# Patient Record
Sex: Female | Born: 1940
Health system: Southern US, Community
[De-identification: ages and names within clinical notes are randomized; demographics above are authoritative.]

## PROBLEM LIST (undated history)

## (undated) DIAGNOSIS — E119 Type 2 diabetes mellitus without complications: Secondary | ICD-10-CM

## (undated) DIAGNOSIS — G709 Myoneural disorder, unspecified: Secondary | ICD-10-CM

## (undated) DIAGNOSIS — F329 Major depressive disorder, single episode, unspecified: Secondary | ICD-10-CM

## (undated) DIAGNOSIS — I2510 Atherosclerotic heart disease of native coronary artery without angina pectoris: Secondary | ICD-10-CM

## (undated) DIAGNOSIS — M797 Fibromyalgia: Secondary | ICD-10-CM

## (undated) DIAGNOSIS — J449 Chronic obstructive pulmonary disease, unspecified: Secondary | ICD-10-CM

## (undated) DIAGNOSIS — I639 Cerebral infarction, unspecified: Secondary | ICD-10-CM

## (undated) DIAGNOSIS — J45909 Unspecified asthma, uncomplicated: Secondary | ICD-10-CM

## (undated) DIAGNOSIS — F32A Depression, unspecified: Secondary | ICD-10-CM

## (undated) DIAGNOSIS — K219 Gastro-esophageal reflux disease without esophagitis: Secondary | ICD-10-CM

## (undated) DIAGNOSIS — G8929 Other chronic pain: Secondary | ICD-10-CM

## (undated) DIAGNOSIS — M1990 Unspecified osteoarthritis, unspecified site: Secondary | ICD-10-CM

## (undated) DIAGNOSIS — I10 Essential (primary) hypertension: Secondary | ICD-10-CM

## (undated) HISTORY — PX: APPENDECTOMY: SHX54

## (undated) HISTORY — PX: ABDOMINAL HYSTERECTOMY: SHX81

## (undated) HISTORY — PX: EYE SURGERY: SHX253

## (undated) HISTORY — PX: SHOULDER ARTHROSCOPY: SHX128

## (undated) HISTORY — DX: Atherosclerotic heart disease of native coronary artery without angina pectoris: I25.10

---

## 2012-01-05 DIAGNOSIS — M1710 Unilateral primary osteoarthritis, unspecified knee: Secondary | ICD-10-CM | POA: Insufficient documentation

## 2012-01-05 DIAGNOSIS — M179 Osteoarthritis of knee, unspecified: Secondary | ICD-10-CM | POA: Insufficient documentation

## 2012-07-07 ENCOUNTER — Ambulatory Visit (HOSPITAL_COMMUNITY)
Admission: RE | Admit: 2012-07-07 | Discharge: 2012-07-07 | Disposition: A | Discharge status: Home / Self Care | Payer: Medicare Other | Source: Ambulatory Visit | Attending: Family Medicine | Admitting: Family Medicine

## 2012-07-07 ENCOUNTER — Other Ambulatory Visit (HOSPITAL_COMMUNITY): Payer: Self-pay | Admitting: Family Medicine

## 2012-07-07 DIAGNOSIS — R112 Nausea with vomiting, unspecified: Secondary | ICD-10-CM | POA: Insufficient documentation

## 2012-07-07 DIAGNOSIS — R109 Unspecified abdominal pain: Secondary | ICD-10-CM | POA: Insufficient documentation

## 2012-07-07 DIAGNOSIS — R932 Abnormal findings on diagnostic imaging of liver and biliary tract: Secondary | ICD-10-CM | POA: Insufficient documentation

## 2012-07-07 DIAGNOSIS — D72829 Elevated white blood cell count, unspecified: Secondary | ICD-10-CM

## 2012-07-07 DIAGNOSIS — R197 Diarrhea, unspecified: Secondary | ICD-10-CM | POA: Insufficient documentation

## 2012-07-07 LAB — POCT I-STAT, CHEM 8
BUN: 19 mg/dL (ref 6–23)
Calcium, Ion: 1.08 mmol/L — ABNORMAL LOW (ref 1.13–1.30)
Chloride: 99 mEq/L (ref 96–112)
Creatinine, Ser: 1 mg/dL (ref 0.50–1.10)
Glucose, Bld: 105 mg/dL — ABNORMAL HIGH (ref 70–99)
HCT: 33 % — ABNORMAL LOW (ref 36.0–46.0)
Hemoglobin: 11.2 g/dL — ABNORMAL LOW (ref 12.0–15.0)
Potassium: 3.9 mEq/L (ref 3.5–5.1)
Sodium: 134 mEq/L — ABNORMAL LOW (ref 135–145)
TCO2: 28 mmol/L (ref 0–100)

## 2012-07-07 MED ORDER — IOHEXOL 300 MG/ML  SOLN
100.0000 mL | Freq: Once | INTRAMUSCULAR | Status: AC | PRN
  Administered 2012-07-07: 100 mL via INTRAVENOUS

## 2012-07-07 NOTE — Progress Notes (Signed)
Blood sample obtained from left arm IV for Creatnine level.  

## 2013-04-30 DIAGNOSIS — M5412 Radiculopathy, cervical region: Secondary | ICD-10-CM | POA: Insufficient documentation

## 2013-06-12 ENCOUNTER — Other Ambulatory Visit: Payer: Self-pay | Admitting: Neurological Surgery

## 2013-07-17 ENCOUNTER — Other Ambulatory Visit (HOSPITAL_COMMUNITY): Payer: Medicare Other

## 2013-07-18 ENCOUNTER — Other Ambulatory Visit: Payer: Self-pay

## 2013-07-24 ENCOUNTER — Other Ambulatory Visit (HOSPITAL_COMMUNITY): Payer: Medicare Other

## 2013-07-25 ENCOUNTER — Encounter (HOSPITAL_COMMUNITY): Admission: RE | Payer: Self-pay | Source: Ambulatory Visit

## 2013-07-25 ENCOUNTER — Inpatient Hospital Stay (HOSPITAL_COMMUNITY): Admission: RE | Admit: 2013-07-25 | Payer: Medicare Other | Source: Ambulatory Visit | Admitting: Neurological Surgery

## 2013-07-25 SURGERY — ANTERIOR CERVICAL DECOMPRESSION/DISCECTOMY FUSION 2 LEVELS
Anesthesia: General

## 2014-02-11 ENCOUNTER — Telehealth: Payer: Self-pay | Admitting: Family Medicine

## 2014-02-11 NOTE — Telephone Encounter (Signed)
Patient aware that we have no available appointments.

## 2014-06-19 DIAGNOSIS — G894 Chronic pain syndrome: Secondary | ICD-10-CM | POA: Diagnosis not present

## 2014-06-19 DIAGNOSIS — E119 Type 2 diabetes mellitus without complications: Secondary | ICD-10-CM | POA: Diagnosis not present

## 2014-06-19 DIAGNOSIS — M159 Polyosteoarthritis, unspecified: Secondary | ICD-10-CM | POA: Diagnosis not present

## 2014-06-19 DIAGNOSIS — J45909 Unspecified asthma, uncomplicated: Secondary | ICD-10-CM | POA: Diagnosis not present

## 2014-09-18 DIAGNOSIS — G609 Hereditary and idiopathic neuropathy, unspecified: Secondary | ICD-10-CM | POA: Diagnosis not present

## 2014-09-18 DIAGNOSIS — M25571 Pain in right ankle and joints of right foot: Secondary | ICD-10-CM | POA: Diagnosis not present

## 2014-09-18 DIAGNOSIS — M79672 Pain in left foot: Secondary | ICD-10-CM | POA: Diagnosis not present

## 2014-09-18 DIAGNOSIS — M25572 Pain in left ankle and joints of left foot: Secondary | ICD-10-CM | POA: Diagnosis not present

## 2014-09-18 DIAGNOSIS — M792 Neuralgia and neuritis, unspecified: Secondary | ICD-10-CM | POA: Diagnosis not present

## 2014-09-18 DIAGNOSIS — M79671 Pain in right foot: Secondary | ICD-10-CM | POA: Diagnosis not present

## 2014-09-19 DIAGNOSIS — J309 Allergic rhinitis, unspecified: Secondary | ICD-10-CM | POA: Diagnosis not present

## 2014-09-19 DIAGNOSIS — B379 Candidiasis, unspecified: Secondary | ICD-10-CM | POA: Diagnosis not present

## 2014-09-19 DIAGNOSIS — M609 Myositis, unspecified: Secondary | ICD-10-CM | POA: Diagnosis not present

## 2014-09-19 DIAGNOSIS — M159 Polyosteoarthritis, unspecified: Secondary | ICD-10-CM | POA: Diagnosis not present

## 2014-09-19 DIAGNOSIS — I10 Essential (primary) hypertension: Secondary | ICD-10-CM | POA: Diagnosis not present

## 2014-09-19 DIAGNOSIS — E119 Type 2 diabetes mellitus without complications: Secondary | ICD-10-CM | POA: Diagnosis not present

## 2014-09-19 DIAGNOSIS — M791 Myalgia: Secondary | ICD-10-CM | POA: Diagnosis not present

## 2014-09-19 DIAGNOSIS — E669 Obesity, unspecified: Secondary | ICD-10-CM | POA: Diagnosis not present

## 2014-09-19 DIAGNOSIS — G4700 Insomnia, unspecified: Secondary | ICD-10-CM | POA: Diagnosis not present

## 2014-09-19 DIAGNOSIS — J45909 Unspecified asthma, uncomplicated: Secondary | ICD-10-CM | POA: Diagnosis not present

## 2014-10-02 DIAGNOSIS — G5752 Tarsal tunnel syndrome, left lower limb: Secondary | ICD-10-CM | POA: Diagnosis not present

## 2014-10-02 DIAGNOSIS — M65872 Other synovitis and tenosynovitis, left ankle and foot: Secondary | ICD-10-CM | POA: Diagnosis not present

## 2014-10-02 DIAGNOSIS — M792 Neuralgia and neuritis, unspecified: Secondary | ICD-10-CM | POA: Diagnosis not present

## 2014-10-21 DIAGNOSIS — M792 Neuralgia and neuritis, unspecified: Secondary | ICD-10-CM | POA: Diagnosis not present

## 2014-11-27 DIAGNOSIS — M792 Neuralgia and neuritis, unspecified: Secondary | ICD-10-CM | POA: Diagnosis not present

## 2014-12-11 DIAGNOSIS — M797 Fibromyalgia: Secondary | ICD-10-CM | POA: Diagnosis not present

## 2014-12-11 DIAGNOSIS — R202 Paresthesia of skin: Secondary | ICD-10-CM | POA: Diagnosis not present

## 2014-12-11 DIAGNOSIS — E531 Pyridoxine deficiency: Secondary | ICD-10-CM | POA: Diagnosis not present

## 2014-12-11 DIAGNOSIS — M5442 Lumbago with sciatica, left side: Secondary | ICD-10-CM | POA: Diagnosis not present

## 2014-12-11 DIAGNOSIS — G5602 Carpal tunnel syndrome, left upper limb: Secondary | ICD-10-CM | POA: Diagnosis not present

## 2014-12-11 DIAGNOSIS — R634 Abnormal weight loss: Secondary | ICD-10-CM | POA: Diagnosis not present

## 2014-12-11 DIAGNOSIS — G603 Idiopathic progressive neuropathy: Secondary | ICD-10-CM | POA: Diagnosis not present

## 2014-12-11 DIAGNOSIS — M5412 Radiculopathy, cervical region: Secondary | ICD-10-CM | POA: Diagnosis not present

## 2014-12-11 DIAGNOSIS — G609 Hereditary and idiopathic neuropathy, unspecified: Secondary | ICD-10-CM | POA: Diagnosis not present

## 2014-12-11 DIAGNOSIS — E538 Deficiency of other specified B group vitamins: Secondary | ICD-10-CM | POA: Diagnosis not present

## 2014-12-11 DIAGNOSIS — M5441 Lumbago with sciatica, right side: Secondary | ICD-10-CM | POA: Diagnosis not present

## 2014-12-11 DIAGNOSIS — M5417 Radiculopathy, lumbosacral region: Secondary | ICD-10-CM | POA: Diagnosis not present

## 2014-12-30 DIAGNOSIS — M797 Fibromyalgia: Secondary | ICD-10-CM | POA: Diagnosis not present

## 2014-12-30 DIAGNOSIS — R300 Dysuria: Secondary | ICD-10-CM | POA: Diagnosis not present

## 2014-12-30 DIAGNOSIS — I10 Essential (primary) hypertension: Secondary | ICD-10-CM | POA: Diagnosis not present

## 2014-12-30 DIAGNOSIS — E119 Type 2 diabetes mellitus without complications: Secondary | ICD-10-CM | POA: Diagnosis not present

## 2014-12-30 DIAGNOSIS — M4800 Spinal stenosis, site unspecified: Secondary | ICD-10-CM | POA: Diagnosis not present

## 2014-12-30 DIAGNOSIS — B379 Candidiasis, unspecified: Secondary | ICD-10-CM | POA: Diagnosis not present

## 2014-12-30 DIAGNOSIS — M159 Polyosteoarthritis, unspecified: Secondary | ICD-10-CM | POA: Diagnosis not present

## 2014-12-30 DIAGNOSIS — Z79899 Other long term (current) drug therapy: Secondary | ICD-10-CM | POA: Diagnosis not present

## 2015-01-14 DIAGNOSIS — M5442 Lumbago with sciatica, left side: Secondary | ICD-10-CM | POA: Diagnosis not present

## 2015-01-14 DIAGNOSIS — M797 Fibromyalgia: Secondary | ICD-10-CM | POA: Diagnosis not present

## 2015-01-14 DIAGNOSIS — R202 Paresthesia of skin: Secondary | ICD-10-CM | POA: Diagnosis not present

## 2015-01-14 DIAGNOSIS — M542 Cervicalgia: Secondary | ICD-10-CM | POA: Diagnosis not present

## 2015-01-14 DIAGNOSIS — G5601 Carpal tunnel syndrome, right upper limb: Secondary | ICD-10-CM | POA: Diagnosis not present

## 2015-01-14 DIAGNOSIS — G5602 Carpal tunnel syndrome, left upper limb: Secondary | ICD-10-CM | POA: Diagnosis not present

## 2015-01-14 DIAGNOSIS — M5441 Lumbago with sciatica, right side: Secondary | ICD-10-CM | POA: Diagnosis not present

## 2015-01-20 DIAGNOSIS — M7989 Other specified soft tissue disorders: Secondary | ICD-10-CM | POA: Diagnosis not present

## 2015-01-20 DIAGNOSIS — M25571 Pain in right ankle and joints of right foot: Secondary | ICD-10-CM | POA: Diagnosis not present

## 2015-01-20 DIAGNOSIS — M7731 Calcaneal spur, right foot: Secondary | ICD-10-CM | POA: Diagnosis not present

## 2015-01-20 DIAGNOSIS — M19071 Primary osteoarthritis, right ankle and foot: Secondary | ICD-10-CM | POA: Diagnosis not present

## 2015-02-14 DIAGNOSIS — J4541 Moderate persistent asthma with (acute) exacerbation: Secondary | ICD-10-CM | POA: Diagnosis not present

## 2015-02-14 DIAGNOSIS — Z6838 Body mass index (BMI) 38.0-38.9, adult: Secondary | ICD-10-CM | POA: Diagnosis not present

## 2015-02-14 DIAGNOSIS — J309 Allergic rhinitis, unspecified: Secondary | ICD-10-CM | POA: Diagnosis not present

## 2015-02-14 DIAGNOSIS — E669 Obesity, unspecified: Secondary | ICD-10-CM | POA: Diagnosis not present

## 2015-02-18 DIAGNOSIS — M5442 Lumbago with sciatica, left side: Secondary | ICD-10-CM | POA: Diagnosis not present

## 2015-02-18 DIAGNOSIS — G5602 Carpal tunnel syndrome, left upper limb: Secondary | ICD-10-CM | POA: Diagnosis not present

## 2015-02-18 DIAGNOSIS — M5441 Lumbago with sciatica, right side: Secondary | ICD-10-CM | POA: Diagnosis not present

## 2015-02-18 DIAGNOSIS — M542 Cervicalgia: Secondary | ICD-10-CM | POA: Diagnosis not present

## 2015-02-18 DIAGNOSIS — G5601 Carpal tunnel syndrome, right upper limb: Secondary | ICD-10-CM | POA: Diagnosis not present

## 2015-04-09 DIAGNOSIS — M170 Bilateral primary osteoarthritis of knee: Secondary | ICD-10-CM | POA: Diagnosis not present

## 2015-04-14 DIAGNOSIS — E119 Type 2 diabetes mellitus without complications: Secondary | ICD-10-CM | POA: Diagnosis not present

## 2015-04-14 DIAGNOSIS — Z6839 Body mass index (BMI) 39.0-39.9, adult: Secondary | ICD-10-CM | POA: Diagnosis not present

## 2015-04-14 DIAGNOSIS — M797 Fibromyalgia: Secondary | ICD-10-CM | POA: Diagnosis not present

## 2015-04-14 DIAGNOSIS — R110 Nausea: Secondary | ICD-10-CM | POA: Diagnosis not present

## 2015-04-14 DIAGNOSIS — M4800 Spinal stenosis, site unspecified: Secondary | ICD-10-CM | POA: Diagnosis not present

## 2015-04-14 DIAGNOSIS — G894 Chronic pain syndrome: Secondary | ICD-10-CM | POA: Diagnosis not present

## 2015-04-14 DIAGNOSIS — E669 Obesity, unspecified: Secondary | ICD-10-CM | POA: Diagnosis not present

## 2015-04-14 DIAGNOSIS — G4700 Insomnia, unspecified: Secondary | ICD-10-CM | POA: Diagnosis not present

## 2015-04-17 DIAGNOSIS — M542 Cervicalgia: Secondary | ICD-10-CM | POA: Diagnosis not present

## 2015-04-17 DIAGNOSIS — G603 Idiopathic progressive neuropathy: Secondary | ICD-10-CM | POA: Diagnosis not present

## 2015-04-17 DIAGNOSIS — G5603 Carpal tunnel syndrome, bilateral upper limbs: Secondary | ICD-10-CM | POA: Diagnosis not present

## 2015-04-17 DIAGNOSIS — M5442 Lumbago with sciatica, left side: Secondary | ICD-10-CM | POA: Diagnosis not present

## 2015-04-17 DIAGNOSIS — M5441 Lumbago with sciatica, right side: Secondary | ICD-10-CM | POA: Diagnosis not present

## 2015-04-17 DIAGNOSIS — M797 Fibromyalgia: Secondary | ICD-10-CM | POA: Diagnosis not present

## 2015-04-17 DIAGNOSIS — G2581 Restless legs syndrome: Secondary | ICD-10-CM | POA: Diagnosis not present

## 2015-05-05 DIAGNOSIS — Z961 Presence of intraocular lens: Secondary | ICD-10-CM | POA: Diagnosis not present

## 2015-05-05 DIAGNOSIS — H04123 Dry eye syndrome of bilateral lacrimal glands: Secondary | ICD-10-CM | POA: Diagnosis not present

## 2015-05-05 LAB — HM DIABETES EYE EXAM

## 2015-05-20 DIAGNOSIS — R3915 Urgency of urination: Secondary | ICD-10-CM | POA: Diagnosis not present

## 2015-05-20 DIAGNOSIS — N3281 Overactive bladder: Secondary | ICD-10-CM | POA: Diagnosis not present

## 2015-05-20 DIAGNOSIS — R351 Nocturia: Secondary | ICD-10-CM | POA: Diagnosis not present

## 2015-05-20 DIAGNOSIS — K5909 Other constipation: Secondary | ICD-10-CM | POA: Diagnosis not present

## 2015-06-02 DIAGNOSIS — G2581 Restless legs syndrome: Secondary | ICD-10-CM | POA: Diagnosis not present

## 2015-06-02 DIAGNOSIS — M797 Fibromyalgia: Secondary | ICD-10-CM | POA: Diagnosis not present

## 2015-06-02 DIAGNOSIS — G603 Idiopathic progressive neuropathy: Secondary | ICD-10-CM | POA: Diagnosis not present

## 2015-06-02 DIAGNOSIS — G5603 Carpal tunnel syndrome, bilateral upper limbs: Secondary | ICD-10-CM | POA: Diagnosis not present

## 2015-06-02 DIAGNOSIS — M5442 Lumbago with sciatica, left side: Secondary | ICD-10-CM | POA: Diagnosis not present

## 2015-06-02 DIAGNOSIS — M542 Cervicalgia: Secondary | ICD-10-CM | POA: Diagnosis not present

## 2015-06-02 DIAGNOSIS — M5441 Lumbago with sciatica, right side: Secondary | ICD-10-CM | POA: Diagnosis not present

## 2015-06-06 DIAGNOSIS — M542 Cervicalgia: Secondary | ICD-10-CM | POA: Diagnosis not present

## 2015-06-06 DIAGNOSIS — M5030 Other cervical disc degeneration, unspecified cervical region: Secondary | ICD-10-CM | POA: Diagnosis not present

## 2015-06-06 DIAGNOSIS — M5412 Radiculopathy, cervical region: Secondary | ICD-10-CM | POA: Diagnosis not present

## 2015-06-06 DIAGNOSIS — M4722 Other spondylosis with radiculopathy, cervical region: Secondary | ICD-10-CM | POA: Diagnosis not present

## 2015-06-06 DIAGNOSIS — Z6838 Body mass index (BMI) 38.0-38.9, adult: Secondary | ICD-10-CM | POA: Diagnosis not present

## 2015-06-06 DIAGNOSIS — I10 Essential (primary) hypertension: Secondary | ICD-10-CM | POA: Diagnosis not present

## 2015-06-11 DIAGNOSIS — M4722 Other spondylosis with radiculopathy, cervical region: Secondary | ICD-10-CM | POA: Diagnosis not present

## 2015-06-11 DIAGNOSIS — M4802 Spinal stenosis, cervical region: Secondary | ICD-10-CM | POA: Diagnosis not present

## 2015-06-18 ENCOUNTER — Other Ambulatory Visit: Payer: Self-pay | Admitting: Neurosurgery

## 2015-06-18 DIAGNOSIS — M5412 Radiculopathy, cervical region: Secondary | ICD-10-CM | POA: Diagnosis not present

## 2015-06-18 DIAGNOSIS — M5030 Other cervical disc degeneration, unspecified cervical region: Secondary | ICD-10-CM | POA: Diagnosis not present

## 2015-06-18 DIAGNOSIS — Z6838 Body mass index (BMI) 38.0-38.9, adult: Secondary | ICD-10-CM | POA: Diagnosis not present

## 2015-06-18 DIAGNOSIS — M4722 Other spondylosis with radiculopathy, cervical region: Secondary | ICD-10-CM | POA: Diagnosis not present

## 2015-06-18 DIAGNOSIS — M5020 Other cervical disc displacement, unspecified cervical region: Secondary | ICD-10-CM | POA: Diagnosis not present

## 2015-06-18 DIAGNOSIS — I10 Essential (primary) hypertension: Secondary | ICD-10-CM | POA: Diagnosis not present

## 2015-06-18 DIAGNOSIS — M542 Cervicalgia: Secondary | ICD-10-CM | POA: Diagnosis not present

## 2015-06-27 ENCOUNTER — Encounter (HOSPITAL_COMMUNITY)
Admission: RE | Admit: 2015-06-27 | Discharge: 2015-06-27 | Disposition: A | Discharge status: Home / Self Care | Payer: Medicare Other | Source: Ambulatory Visit | Attending: Neurosurgery | Admitting: Neurosurgery

## 2015-06-27 ENCOUNTER — Encounter (HOSPITAL_COMMUNITY): Payer: Self-pay

## 2015-06-27 DIAGNOSIS — Z01812 Encounter for preprocedural laboratory examination: Secondary | ICD-10-CM | POA: Diagnosis not present

## 2015-06-27 DIAGNOSIS — M797 Fibromyalgia: Secondary | ICD-10-CM | POA: Diagnosis not present

## 2015-06-27 DIAGNOSIS — I10 Essential (primary) hypertension: Secondary | ICD-10-CM | POA: Diagnosis not present

## 2015-06-27 DIAGNOSIS — J45909 Unspecified asthma, uncomplicated: Secondary | ICD-10-CM | POA: Insufficient documentation

## 2015-06-27 DIAGNOSIS — J449 Chronic obstructive pulmonary disease, unspecified: Secondary | ICD-10-CM | POA: Insufficient documentation

## 2015-06-27 DIAGNOSIS — G709 Myoneural disorder, unspecified: Secondary | ICD-10-CM | POA: Insufficient documentation

## 2015-06-27 DIAGNOSIS — M5020 Other cervical disc displacement, unspecified cervical region: Secondary | ICD-10-CM | POA: Insufficient documentation

## 2015-06-27 DIAGNOSIS — Z79899 Other long term (current) drug therapy: Secondary | ICD-10-CM | POA: Diagnosis not present

## 2015-06-27 DIAGNOSIS — K219 Gastro-esophageal reflux disease without esophagitis: Secondary | ICD-10-CM | POA: Diagnosis not present

## 2015-06-27 DIAGNOSIS — Z01818 Encounter for other preprocedural examination: Secondary | ICD-10-CM | POA: Insufficient documentation

## 2015-06-27 DIAGNOSIS — F329 Major depressive disorder, single episode, unspecified: Secondary | ICD-10-CM | POA: Diagnosis not present

## 2015-06-27 HISTORY — DX: Chronic obstructive pulmonary disease, unspecified: J44.9

## 2015-06-27 HISTORY — DX: Unspecified osteoarthritis, unspecified site: M19.90

## 2015-06-27 HISTORY — DX: Gastro-esophageal reflux disease without esophagitis: K21.9

## 2015-06-27 HISTORY — DX: Unspecified asthma, uncomplicated: J45.909

## 2015-06-27 HISTORY — DX: Essential (primary) hypertension: I10

## 2015-06-27 HISTORY — DX: Fibromyalgia: M79.7

## 2015-06-27 HISTORY — DX: Depression, unspecified: F32.A

## 2015-06-27 HISTORY — DX: Myoneural disorder, unspecified: G70.9

## 2015-06-27 HISTORY — DX: Major depressive disorder, single episode, unspecified: F32.9

## 2015-06-27 HISTORY — DX: Type 2 diabetes mellitus without complications: E11.9

## 2015-06-27 LAB — BASIC METABOLIC PANEL
Anion gap: 12 (ref 5–15)
BUN: 18 mg/dL (ref 6–20)
CO2: 27 mmol/L (ref 22–32)
Calcium: 9 mg/dL (ref 8.9–10.3)
Chloride: 101 mmol/L (ref 101–111)
Creatinine, Ser: 0.79 mg/dL (ref 0.44–1.00)
GFR calc Af Amer: >60 mL/min (ref >60)
GFR calc non Af Amer: >60 mL/min (ref >60)
Glucose, Bld: 180 mg/dL — ABNORMAL HIGH (ref 65–99)
Potassium: 3.9 mmol/L (ref 3.5–5.1)
Sodium: 140 mmol/L (ref 135–145)

## 2015-06-27 LAB — SURGICAL PCR SCREEN
MRSA, PCR: NEGATIVE
Staphylococcus aureus: NEGATIVE

## 2015-06-27 LAB — CBC
HCT: 38.9 % (ref 36.0–46.0)
Hemoglobin: 12.6 g/dL (ref 12.0–15.0)
MCH: 28.8 pg (ref 26.0–34.0)
MCHC: 32.4 g/dL (ref 30.0–36.0)
MCV: 88.8 fL (ref 78.0–100.0)
Platelets: 413 10*3/uL — ABNORMAL HIGH (ref 150–400)
RBC: 4.38 MIL/uL (ref 3.87–5.11)
RDW: 13.8 % (ref 11.5–15.5)
WBC: 10.4 10*3/uL (ref 4.0–10.5)

## 2015-06-27 NOTE — Pre-Procedure Instructions (Signed)
Susan Patel  06/27/2015      [id number] [place] [place] [address] Phone: [phone] Fax: [phone]    Your procedure is scheduled on 07/03/2015  Report to [facility] Admitting at 10:15 A.M.  Call this number if you have problems the morning of surgery:  [phone]   Remember:  Do not eat food or drink liquids after midnight.  On Wednesday   Take these medicines the morning of surgery with A SIP OF WATER :  BREO inhaler, Zyrtec, Nexium, (Pain med. - if needed), Premarin, Duloxetine   Do not wear jewelry, make-up or nail polish.   Do not wear lotions, powders, or perfumes.  You may wear deodorant.   Do not shave 48 hours prior to surgery.     Do not bring valuables to the hospital.   [facility] is not responsible for any belongings or valuables.  Contacts, dentures or bridgework may not be worn into surgery.  Leave your suitcase in the car.  After surgery it may be brought to your room.  For patients admitted to the hospital, discharge time will be determined by your treatment team.  Patients discharged the day of surgery will not be allowed to drive home.   Name and phone number of your driver:   /w spouse  Special instructions:  Special Instructions: [place] - Preparing for Surgery  Before surgery, you can play an important role.  Because skin is not sterile, your skin needs to be as free of germs as possible.  You can reduce the number of germs on you skin by washing with CHG (chlorahexidine gluconate) soap before surgery.  CHG is an antiseptic cleaner which kills germs and bonds with the skin to continue killing germs even after washing.  Please DO NOT use if you have an allergy to CHG or antibacterial soaps.  If your skin becomes reddened/irritated stop using the CHG and inform your nurse when you arrive at Short Stay.  Do not shave (including legs and underarms) for at least  48 hours prior to the first CHG shower.  You may shave your face.  Please follow these instructions carefully:   1.  Shower with CHG Soap the night before surgery and the  morning of Surgery.  2.  If you choose to wash your hair, wash your hair first as usual with your  normal shampoo.  3.  After you shampoo, rinse your hair and body thoroughly to remove the  Shampoo.  4.  Use CHG as you would any other liquid soap.  You can apply chg directly to the skin and wash gently with scrungie or a clean washcloth.  5.  Apply the CHG Soap to your body ONLY FROM THE NECK DOWN.    Do not use on open wounds or open sores.  Avoid contact with your eyes, ears, mouth and genitals (private parts).  Wash genitals (private parts)   with your normal soap.  6.  Wash thoroughly, paying special attention to the area where your surgery will be performed.  7.  Thoroughly rinse your body with warm water from the neck down.  8.  DO NOT shower/wash with your normal soap after using and rinsing off   the CHG Soap.  9.  Pat yourself dry with a clean towel.            10.  Wear clean pajamas.  11.  Place clean sheets on your bed the night of your first shower and do not sleep with pets.  Day of Surgery  Do not apply any lotions/deodorants the morning of surgery.  Please wear clean clothes to the hospital/surgery center.  Please read over the following fact sheets that you were given. Pain Booklet, Coughing and Deep Breathing, MRSA Information and Surgical Site Infection Prevention

## 2015-06-27 NOTE — Progress Notes (Signed)
Pt. Followed by Dr. Avel Peace in [place], requesting records by fax, pt. Also remarks that she sees Dr. Trula Ore (neurology) in [facility].  Pt. Reports pain all over her body, states she "was just given Cymbalta" yet  the bottle, has a date of 11/2014. Pt. States she doesn't take it all the time because it isn't helping. Pt. counselled to take meds. As prescribed by doctor or to call the doctor back to tell them its not helping. Pt. Reports that she had a stress test >5 yrs. Ago & told that it was wnl. Pt. Reports that she uses O2 at night , /w a history of asthma.

## 2015-06-30 NOTE — Progress Notes (Addendum)
Anesthesia Chart Review: Patient is a 75 year old female scheduled for C5-6, C6-7 ACDF on 07/03/2015 by Dr. Sherwood Gambler.  History includes nonsmoker, COPD with nocturnal oxygen (3.5-4L/[place]), hypertension, "borderline" diabetes mellitus type II, asthma, depression, fibromyalgia, GERD, neuromuscular disorder ("parkinson, neuropathy-both feet & hands"), hysterectomy, appendectomy. BMI is consistent with obesity.   PCP is Dr. Silvano Rusk with [facility] in [place], last visit 04/23/15. Records do not indicate a history of Parkinson's. She was considering neck surgery at that time.  Neurologist is Dr. Trula Ore with [facility] in [place]. Pulmonologist is Dr. Loletha Carrow (or sees Elijio Miles, PA-C) with [facility] in [place].   I called patient to clarify history. She reports that she has a tremor primarily in her BUE, and was told that she had the beginning stages of Parkinson's disease. She tried one medication (can't remember the name), but said it made her tremor worse so it was stopped. In regards to her COPD, she reports being on home O2 for several years now (initially 2-2.5L but she increased to 3.5-4L about a year ago because she felt she couldn't breathe as well on her previous regimen). She thinks last spirometry was > 1 year ago. She says she tested negative for OSA, but was told she had RLS. She lives at home with her husband of 50+ years. Prior to one month ago (before her gait instability, possible related to her cervical stenosis), she was able to do light house work (folding clothes, dusting) for up to 2 hours without stopping and did not need supplemental oxygen. She does have days that she wears her O2, but not recently. Denied any recent hospitalization for COPD exacerbation. Has a intermittent cough felt more related to her asthma. No fever. She can sleep on her back with only one pillow.    Current med list includes albuterol, Breo Ellipta, Zyrtec, Cymbalta, Nexium,  HCTZ, Norco, Xalantan ophthalmic, losartan, Singulair, Premarin, Ambien. (Patient reported to PAT RN that she will bring a written list on the day of surgery to verify we have the most updated list.)  06/27/15 EKG: NSR. She reports a normal stress test 10-15 years ago. Denied CAD/MI/CHF history.  Preoperative labs noted. Non-fasting glucose 180. Doesn't check CBG at home, but says her PCP says A1c is always "good." A1c was not done at PAT (done > 2 months ago at PCP).   Discussed above with anesthesiologist Dr. Linna Caprice. Will attempt to get her last neurology note to provide more insight into her "parkinson's" diagnosis. In regards to her COPD with nocturnal O2, we will request last pulmonology records and recent pertinent studies (ie, sleep study, spirometry, CXR/CT as available). We want pre-operative pulmonology input and more information regarding why she has required 4L/[place] at night. She describes stable pulmonary symptoms, so it would be up to her pulmonologist to decide if she needed a pre-operative pulmonology visit. I will notify Janett Billow or Manuela Schwartz at Dr. Donnella Bi office.   [facility] Short Stay Center/Anesthesiology Phone [phone] 06/30/2015 5:39 PM  Addendum:  [facility] records received today. Last visit 06/02/15 by Stark Jock, PA-C. Assessment includes acute bilateral carpal tunnel syndrome, acute bilateral back pain with sciatica, cervicalgia, fibromyalgia, paresthesia, polyneuropathy, lumbosacral and cervical raiculopathy. Ropinirole (for RLS) was discontinued due to side effects. There is no mention of Parkinson's.  This morning, I also called and spoke with Alyse Low at Dr. Gaylan Gerold office requesting someone call me to discuss patient's pulmonary history and surgery plans. Nurse Suzanna Obey called me around  4:30 PM today. Patient was last seen on 02/14/15 by Greggory Stallion, PA-C for asthma exacerbation. She last saw Dr. Welford Roche on 09/05/13. Apparently, there was  discussion of needing neck surgery at that time, but her surgeon was wanting her cough to improve first. Her ACE inhibitor was switched to an ARB then to see if that would help. Qvar samples also given. PPI BID also recommended. Her December appointment was apparently canceled and hasn't been rescheduled yet. Per Butch Penny patient's sleep study was from 2010. She was also found to have nocturnal hypoxia at that time and placed on 2L/[place] at night. She could not find any notes that indicated a need to increase up to 4L/[place] as the patient has been doing. Pulmonology problem list includes asthma, allergic rhinitis, cough, esophageal reflux, hypertension--not COPD. Reportedly, Dr. Welford Roche is in the office this week and can review records.   12/29/12 Spirometry: FVC 1.49 (59%) FEV1 1.96 (57%) FEV1/FVC 76% (97%) FEF25-75% 0.96 (51% Moderately severe restriction.  Last CXR there on 07/09/13 showed bronchitic changes.  Will await Dr. Gaylan Gerold input to determine clearance status.   [facility] Short Stay Center/Anesthesiology Phone [phone] 07/01/2015 6:01 PM  Addendum: I spoke with nurse Linus Orn at Dr. Gaylan Gerold office. He does not feel comfortable giving patient pulmonary clearance for this procedure since he last saw her in 08/2014. He felt she would likely need repeat spirometry as well. Per my conversation with Dr. Linna Caprice, he felt procedure should be postponed if Dr. Welford Roche would not clear patient for surgery. I have notified Manuela Schwartz at Dr. Donnella Bi office.  [facility] Short Stay Center/Anesthesiology Phone [phone] 07/02/2015 1:13 PM

## 2015-07-02 ENCOUNTER — Other Ambulatory Visit: Payer: Self-pay | Admitting: Neurosurgery

## 2015-07-02 MED ORDER — CEFAZOLIN SODIUM-DEXTROSE 2-3 GM-% IV SOLR: 2.0000 g | INTRAVENOUS | Status: DC

## 2015-07-03 ENCOUNTER — Ambulatory Visit (HOSPITAL_COMMUNITY): Admission: RE | Admit: 2015-07-03 | Payer: Medicare Other | Source: Ambulatory Visit | Admitting: Neurosurgery

## 2015-07-03 ENCOUNTER — Encounter (HOSPITAL_COMMUNITY): Admission: RE | Payer: Self-pay | Source: Ambulatory Visit

## 2015-07-03 DIAGNOSIS — Z01811 Encounter for preprocedural respiratory examination: Secondary | ICD-10-CM | POA: Diagnosis not present

## 2015-07-03 DIAGNOSIS — J4540 Moderate persistent asthma, uncomplicated: Secondary | ICD-10-CM | POA: Diagnosis not present

## 2015-07-03 DIAGNOSIS — E785 Hyperlipidemia, unspecified: Secondary | ICD-10-CM | POA: Diagnosis not present

## 2015-07-03 DIAGNOSIS — E119 Type 2 diabetes mellitus without complications: Secondary | ICD-10-CM | POA: Diagnosis not present

## 2015-07-03 DIAGNOSIS — Z01818 Encounter for other preprocedural examination: Secondary | ICD-10-CM | POA: Diagnosis not present

## 2015-07-03 DIAGNOSIS — G4734 Idiopathic sleep related nonobstructive alveolar hypoventilation: Secondary | ICD-10-CM | POA: Diagnosis not present

## 2015-07-03 SURGERY — ANTERIOR CERVICAL DECOMPRESSION/DISCECTOMY FUSION 2 LEVELS
Anesthesia: General

## 2015-07-03 NOTE — Progress Notes (Signed)
Anesthesia follow-up: Patient is a 75 year old female scheduled for C5-6, C6-7 ACDF by Dr. Sherwood Gambler. Case was initially scheduled for 07/03/15, but was canceled to allow time for a preoperative pulmonology evaluation by Dr. Loletha Carrow. See my note from 06/30/15 with updates.   Since then patient was seen by Dr. Welford Roche on 07/03/15. A copy was faxed and placed on her chart, but can also be viewed in [place]. He felt her moderate persistent asthma was "well-controlled for surgery." He said that Dr. Avel Peace was actually prescribing nocturnal O2, but that sleep study in 2010 showed no significant OSA. ONO in 2010 showed one period of desaturations. He plans to repeat an ONO in the future and will consider repeat sleep study. In the meantime, he writes, "I think she is in as good shape as she can be, and can proceed with surgery. Consider BiPAP post-op if needed." He classified her as a Malampati 4 upper airway.   07/03/15 CXR (Care Everywhere): FINDINGS: The lungs are adequately inflated without hemidiaphragm flattening. There is no alveolar infiltrate. There is no pleural effusion or pneumothorax. The heart and pulmonary vascularity are normal. The mediastinum is normal in width. There is mild multilevel degenerative disc disease of the thoracic spine. IMPRESSION: There is no active cardiopulmonary disease.  By 07/01/15 pulmonology note, "Spirometry shows no obstruction or restriction, inspiratory loop OK."   Labs done on 07/03/15 [facility] Everywhere; I believe done as part of her upcoming physical with Dr. Avel Peace scheduled for 07/16/15) show an A1c of 7.9%. Glucose was 169. Cr 0.75. H/H 13.5/40.3. PLT 416K. Her cholesterol was also elevated.    If no acute changes then I would anticipate that she could proceed as planned.  [facility] Short Stay Center/Anesthesiology Phone [phone] 07/03/2015 3:51 PM

## 2015-07-09 MED ORDER — CEFAZOLIN SODIUM-DEXTROSE 2-3 GM-% IV SOLR
2.0000 g | INTRAVENOUS | Status: AC
  Administered 2015-07-10: 2 g via INTRAVENOUS
  Filled 2015-07-09: qty 50

## 2015-07-10 ENCOUNTER — Ambulatory Visit (HOSPITAL_COMMUNITY): Payer: Medicare Other | Admitting: Vascular Surgery

## 2015-07-10 ENCOUNTER — Ambulatory Visit (HOSPITAL_COMMUNITY): Payer: Medicare Other

## 2015-07-10 ENCOUNTER — Encounter (HOSPITAL_COMMUNITY): Payer: Self-pay | Admitting: General Practice

## 2015-07-10 ENCOUNTER — Encounter (HOSPITAL_COMMUNITY)
Admission: RE | Disposition: A | Discharge status: Home / Self Care | Payer: Self-pay | Source: Ambulatory Visit | Attending: Neurosurgery

## 2015-07-10 ENCOUNTER — Observation Stay (HOSPITAL_COMMUNITY)
Admission: RE | Admit: 2015-07-10 | Discharge: 2015-07-11 | Disposition: A | Discharge status: Home / Self Care | Payer: Medicare Other | Source: Ambulatory Visit | Attending: Neurosurgery | Admitting: Neurosurgery

## 2015-07-10 DIAGNOSIS — M797 Fibromyalgia: Secondary | ICD-10-CM | POA: Insufficient documentation

## 2015-07-10 DIAGNOSIS — M4722 Other spondylosis with radiculopathy, cervical region: Secondary | ICD-10-CM | POA: Insufficient documentation

## 2015-07-10 DIAGNOSIS — K219 Gastro-esophageal reflux disease without esophagitis: Secondary | ICD-10-CM | POA: Diagnosis not present

## 2015-07-10 DIAGNOSIS — I10 Essential (primary) hypertension: Secondary | ICD-10-CM | POA: Diagnosis not present

## 2015-07-10 DIAGNOSIS — Z79899 Other long term (current) drug therapy: Secondary | ICD-10-CM | POA: Insufficient documentation

## 2015-07-10 DIAGNOSIS — Z419 Encounter for procedure for purposes other than remedying health state, unspecified: Secondary | ICD-10-CM

## 2015-07-10 DIAGNOSIS — M5030 Other cervical disc degeneration, unspecified cervical region: Secondary | ICD-10-CM | POA: Diagnosis present

## 2015-07-10 DIAGNOSIS — J449 Chronic obstructive pulmonary disease, unspecified: Secondary | ICD-10-CM | POA: Insufficient documentation

## 2015-07-10 DIAGNOSIS — M50122 Cervical disc disorder at C5-C6 level with radiculopathy: Principal | ICD-10-CM | POA: Insufficient documentation

## 2015-07-10 DIAGNOSIS — Z9114 Patient's other noncompliance with medication regimen: Secondary | ICD-10-CM | POA: Diagnosis not present

## 2015-07-10 DIAGNOSIS — M159 Polyosteoarthritis, unspecified: Secondary | ICD-10-CM | POA: Insufficient documentation

## 2015-07-10 DIAGNOSIS — Z6838 Body mass index (BMI) 38.0-38.9, adult: Secondary | ICD-10-CM | POA: Insufficient documentation

## 2015-07-10 DIAGNOSIS — M1990 Unspecified osteoarthritis, unspecified site: Secondary | ICD-10-CM | POA: Diagnosis not present

## 2015-07-10 DIAGNOSIS — F329 Major depressive disorder, single episode, unspecified: Secondary | ICD-10-CM | POA: Insufficient documentation

## 2015-07-10 DIAGNOSIS — M5020 Other cervical disc displacement, unspecified cervical region: Secondary | ICD-10-CM | POA: Diagnosis not present

## 2015-07-10 DIAGNOSIS — G20 Parkinson's disease: Secondary | ICD-10-CM | POA: Diagnosis not present

## 2015-07-10 DIAGNOSIS — Z79890 Hormone replacement therapy (postmenopausal): Secondary | ICD-10-CM | POA: Insufficient documentation

## 2015-07-10 DIAGNOSIS — R1310 Dysphagia, unspecified: Secondary | ICD-10-CM | POA: Insufficient documentation

## 2015-07-10 DIAGNOSIS — J4540 Moderate persistent asthma, uncomplicated: Secondary | ICD-10-CM | POA: Diagnosis not present

## 2015-07-10 DIAGNOSIS — Z7951 Long term (current) use of inhaled steroids: Secondary | ICD-10-CM | POA: Diagnosis not present

## 2015-07-10 DIAGNOSIS — Z9889 Other specified postprocedural states: Secondary | ICD-10-CM | POA: Diagnosis not present

## 2015-07-10 DIAGNOSIS — E669 Obesity, unspecified: Secondary | ICD-10-CM | POA: Insufficient documentation

## 2015-07-10 DIAGNOSIS — M50222 Other cervical disc displacement at C5-C6 level: Secondary | ICD-10-CM | POA: Diagnosis not present

## 2015-07-10 DIAGNOSIS — E1142 Type 2 diabetes mellitus with diabetic polyneuropathy: Secondary | ICD-10-CM | POA: Diagnosis not present

## 2015-07-10 HISTORY — PX: ANTERIOR CERVICAL DECOMP/DISCECTOMY FUSION: SHX1161

## 2015-07-10 LAB — GLUCOSE, CAPILLARY
Glucose-Capillary: 157 mg/dL — ABNORMAL HIGH (ref 65–99)
Glucose-Capillary: 156 mg/dL — ABNORMAL HIGH (ref 65–99)

## 2015-07-10 SURGERY — ANTERIOR CERVICAL DECOMPRESSION/DISCECTOMY FUSION 2 LEVELS
Anesthesia: General | Site: Neck

## 2015-07-10 MED ORDER — ROCURONIUM BROMIDE 100 MG/10ML IV SOLN
INTRAVENOUS | Status: DC | PRN
  Administered 2015-07-10: 10 mg via INTRAVENOUS
  Administered 2015-07-10: 50 mg via INTRAVENOUS

## 2015-07-10 MED ORDER — HYDROMORPHONE HCL 1 MG/ML IJ SOLN: 0.2500 mg | INTRAMUSCULAR | Status: DC | PRN

## 2015-07-10 MED ORDER — ACETAMINOPHEN 650 MG RE SUPP: 650.0000 mg | RECTAL | Status: DC | PRN

## 2015-07-10 MED ORDER — ONDANSETRON HCL 4 MG/2ML IJ SOLN
INTRAMUSCULAR | Status: DC | PRN
  Administered 2015-07-10: 4 mg via INTRAVENOUS

## 2015-07-10 MED ORDER — HYDROXYZINE HCL 50 MG/ML IM SOLN
50.0000 mg | INTRAMUSCULAR | Status: DC | PRN
Start: 2015-07-10 — End: 2015-07-11

## 2015-07-10 MED ORDER — SODIUM CHLORIDE 0.9 % IR SOLN
Status: DC | PRN
  Administered 2015-07-10: 13:00:00

## 2015-07-10 MED ORDER — LOSARTAN POTASSIUM 50 MG PO TABS
50.0000 mg | ORAL_TABLET | Freq: Every day | ORAL | Status: DC
  Administered 2015-07-11: 50 mg via ORAL
  Filled 2015-07-10: qty 1

## 2015-07-10 MED ORDER — ACETAMINOPHEN 10 MG/ML IV SOLN
INTRAVENOUS | Status: AC
  Filled 2015-07-10: qty 100

## 2015-07-10 MED ORDER — PROPOFOL 10 MG/ML IV BOLUS
INTRAVENOUS | Status: DC | PRN
  Administered 2015-07-10: 170 mg via INTRAVENOUS

## 2015-07-10 MED ORDER — KETOROLAC TROMETHAMINE 30 MG/ML IJ SOLN
INTRAMUSCULAR | Status: AC
  Filled 2015-07-10: qty 1

## 2015-07-10 MED ORDER — BUPIVACAINE HCL (PF) 0.25 % IJ SOLN
INTRAMUSCULAR | Status: DC | PRN
  Administered 2015-07-10: 5 mL

## 2015-07-10 MED ORDER — SUGAMMADEX SODIUM 200 MG/2ML IV SOLN
INTRAVENOUS | Status: DC | PRN
  Administered 2015-07-10: 200 mg via INTRAVENOUS

## 2015-07-10 MED ORDER — LABETALOL HCL 5 MG/ML IV SOLN
5.0000 mg | INTRAVENOUS | Status: DC | PRN
  Administered 2015-07-10 (×3): 5 mg via INTRAVENOUS

## 2015-07-10 MED ORDER — LACTATED RINGERS IV SOLN
INTRAVENOUS | Status: DC
  Administered 2015-07-10: 10:00:00 via INTRAVENOUS

## 2015-07-10 MED ORDER — FENTANYL CITRATE (PF) 250 MCG/5ML IJ SOLN
INTRAMUSCULAR | Status: AC
  Filled 2015-07-10: qty 5

## 2015-07-10 MED ORDER — MIDAZOLAM HCL 2 MG/2ML IJ SOLN
INTRAMUSCULAR | Status: AC
  Filled 2015-07-10: qty 2

## 2015-07-10 MED ORDER — SODIUM CHLORIDE 0.9% FLUSH: 3.0000 mL | INTRAVENOUS | Status: DC | PRN

## 2015-07-10 MED ORDER — BISACODYL 10 MG RE SUPP: 10.0000 mg | Freq: Every day | RECTAL | Status: DC | PRN

## 2015-07-10 MED ORDER — KETOROLAC TROMETHAMINE 30 MG/ML IJ SOLN
30.0000 mg | Freq: Once | INTRAMUSCULAR | Status: AC
  Administered 2015-07-10: 30 mg via INTRAVENOUS

## 2015-07-10 MED ORDER — ALBUTEROL SULFATE (2.5 MG/3ML) 0.083% IN NEBU: 2.5000 mg | INHALATION_SOLUTION | Freq: Four times a day (QID) | RESPIRATORY_TRACT | Status: DC | PRN

## 2015-07-10 MED ORDER — THROMBIN 5000 UNITS EX SOLR
OROMUCOSAL | Status: DC | PRN
  Administered 2015-07-10: 13:00:00 via TOPICAL

## 2015-07-10 MED ORDER — MORPHINE SULFATE (PF) 4 MG/ML IV SOLN
4.0000 mg | INTRAVENOUS | Status: DC | PRN
  Administered 2015-07-11: 4 mg via INTRAMUSCULAR
  Filled 2015-07-10: qty 1

## 2015-07-10 MED ORDER — KETOROLAC TROMETHAMINE 30 MG/ML IJ SOLN: 30.0000 mg | Freq: Four times a day (QID) | INTRAMUSCULAR | Status: DC

## 2015-07-10 MED ORDER — CYCLOBENZAPRINE HCL 10 MG PO TABS
10.0000 mg | ORAL_TABLET | Freq: Three times a day (TID) | ORAL | Status: DC | PRN
  Administered 2015-07-10: 10 mg via ORAL
  Filled 2015-07-10: qty 1

## 2015-07-10 MED ORDER — DULOXETINE HCL 60 MG PO CPEP
60.0000 mg | ORAL_CAPSULE | Freq: Every day | ORAL | Status: DC
  Administered 2015-07-11: 60 mg via ORAL
  Filled 2015-07-10: qty 1
  Filled 2015-07-10: qty 2

## 2015-07-10 MED ORDER — PROPOFOL 10 MG/ML IV BOLUS
INTRAVENOUS | Status: AC
  Filled 2015-07-10: qty 20

## 2015-07-10 MED ORDER — HYDROCHLOROTHIAZIDE 25 MG PO TABS
25.0000 mg | ORAL_TABLET | Freq: Every day | ORAL | Status: DC
  Administered 2015-07-11: 25 mg via ORAL
  Filled 2015-07-10: qty 1

## 2015-07-10 MED ORDER — OXYCODONE-ACETAMINOPHEN 5-325 MG PO TABS
1.0000 | ORAL_TABLET | ORAL | Status: DC | PRN
  Administered 2015-07-11 (×2): 2 via ORAL
  Filled 2015-07-10 (×3): qty 2

## 2015-07-10 MED ORDER — ONDANSETRON HCL 4 MG PO TABS: 4.0000 mg | ORAL_TABLET | Freq: Four times a day (QID) | ORAL | Status: DC | PRN

## 2015-07-10 MED ORDER — MENTHOL 3 MG MT LOZG
1.0000 | LOZENGE | OROMUCOSAL | Status: DC | PRN
Start: 2015-07-10 — End: 2015-07-11
  Filled 2015-07-10: qty 9

## 2015-07-10 MED ORDER — PHENOL 1.4 % MT LIQD
1.0000 | OROMUCOSAL | Status: DC | PRN
  Filled 2015-07-10: qty 177

## 2015-07-10 MED ORDER — LIDOCAINE-EPINEPHRINE 1 %-1:100000 IJ SOLN
INTRAMUSCULAR | Status: DC | PRN
  Administered 2015-07-10: 5 mL

## 2015-07-10 MED ORDER — HYDROXYZINE HCL 25 MG PO TABS: 50.0000 mg | ORAL_TABLET | ORAL | Status: DC | PRN

## 2015-07-10 MED ORDER — MAGNESIUM HYDROXIDE 400 MG/5ML PO SUSP: 30.0000 mL | Freq: Every day | ORAL | Status: DC | PRN

## 2015-07-10 MED ORDER — LABETALOL HCL 5 MG/ML IV SOLN
INTRAVENOUS | Status: AC
Start: 2015-07-10 — End: 2015-07-11
  Filled 2015-07-10: qty 4

## 2015-07-10 MED ORDER — LORATADINE 10 MG PO TABS
10.0000 mg | ORAL_TABLET | Freq: Every day | ORAL | Status: DC
  Administered 2015-07-11: 10 mg via ORAL
  Filled 2015-07-10: qty 1

## 2015-07-10 MED ORDER — LIDOCAINE HCL (CARDIAC) 20 MG/ML IV SOLN
INTRAVENOUS | Status: DC | PRN
  Administered 2015-07-10: 60 mg via INTRAVENOUS

## 2015-07-10 MED ORDER — ZOLPIDEM TARTRATE 5 MG PO TABS
5.0000 mg | ORAL_TABLET | Freq: Every evening | ORAL | Status: DC | PRN
  Administered 2015-07-11: 5 mg via ORAL
  Filled 2015-07-10: qty 1

## 2015-07-10 MED ORDER — ESTROGENS CONJUGATED 0.9 MG PO TABS
0.9000 mg | ORAL_TABLET | Freq: Every day | ORAL | Status: DC
  Administered 2015-07-11: 0.9 mg via ORAL
  Filled 2015-07-10: qty 1

## 2015-07-10 MED ORDER — MIDAZOLAM HCL 5 MG/5ML IJ SOLN
INTRAMUSCULAR | Status: DC | PRN
  Administered 2015-07-10: 1 mg via INTRAVENOUS

## 2015-07-10 MED ORDER — PANTOPRAZOLE SODIUM 40 MG PO TBEC
40.0000 mg | DELAYED_RELEASE_TABLET | Freq: Every day | ORAL | Status: DC
  Administered 2015-07-11: 40 mg via ORAL
  Filled 2015-07-10: qty 1

## 2015-07-10 MED ORDER — THROMBIN 20000 UNITS EX SOLR
CUTANEOUS | Status: DC | PRN
  Administered 2015-07-10: 13:00:00 via TOPICAL

## 2015-07-10 MED ORDER — ACETAMINOPHEN 10 MG/ML IV SOLN
INTRAVENOUS | Status: DC | PRN
  Administered 2015-07-10: 1000 mg via INTRAVENOUS

## 2015-07-10 MED ORDER — ONDANSETRON HCL 4 MG/2ML IJ SOLN
4.0000 mg | Freq: Four times a day (QID) | INTRAMUSCULAR | Status: DC | PRN
  Administered 2015-07-10: 4 mg via INTRAVENOUS
  Filled 2015-07-10: qty 2

## 2015-07-10 MED ORDER — HEMOSTATIC AGENTS (NO CHARGE) OPTIME
TOPICAL | Status: DC | PRN
  Administered 2015-07-10: 1 via TOPICAL

## 2015-07-10 MED ORDER — LACTATED RINGERS IV SOLN
INTRAVENOUS | Status: DC | PRN
  Administered 2015-07-10 (×2): via INTRAVENOUS

## 2015-07-10 MED ORDER — LATANOPROST 0.005 % OP SOLN
1.0000 [drp] | OPHTHALMIC | Status: DC
  Filled 2015-07-10: qty 2.5

## 2015-07-10 MED ORDER — ACETAMINOPHEN 325 MG PO TABS: 650.0000 mg | ORAL_TABLET | ORAL | Status: DC | PRN

## 2015-07-10 MED ORDER — SODIUM CHLORIDE 0.9% FLUSH
3.0000 mL | Freq: Two times a day (BID) | INTRAVENOUS | Status: DC
  Administered 2015-07-10 – 2015-07-11 (×2): 3 mL via INTRAVENOUS

## 2015-07-10 MED ORDER — SUGAMMADEX SODIUM 200 MG/2ML IV SOLN
INTRAVENOUS | Status: AC
  Filled 2015-07-10: qty 2

## 2015-07-10 MED ORDER — FENTANYL CITRATE (PF) 100 MCG/2ML IJ SOLN
INTRAMUSCULAR | Status: DC | PRN
  Administered 2015-07-10: 200 ug via INTRAVENOUS
  Administered 2015-07-10 (×2): 50 ug via INTRAVENOUS

## 2015-07-10 MED ORDER — MONTELUKAST SODIUM 10 MG PO TABS
10.0000 mg | ORAL_TABLET | Freq: Every evening | ORAL | Status: DC
  Administered 2015-07-10: 10 mg via ORAL
  Filled 2015-07-10 (×2): qty 1

## 2015-07-10 MED ORDER — SODIUM CHLORIDE 0.9 % IV SOLN: 250.0000 mL | INTRAVENOUS | Status: DC

## 2015-07-10 MED ORDER — ALUM & MAG HYDROXIDE-SIMETH 200-200-20 MG/5ML PO SUSP: 30.0000 mL | Freq: Four times a day (QID) | ORAL | Status: DC | PRN

## 2015-07-10 MED ORDER — METOCLOPRAMIDE HCL 5 MG/ML IJ SOLN: 10.0000 mg | Freq: Once | INTRAMUSCULAR | Status: DC | PRN

## 2015-07-10 MED ORDER — KCL IN DEXTROSE-NACL 20-5-0.45 MEQ/L-%-% IV SOLN: INTRAVENOUS | Status: DC

## 2015-07-10 MED ORDER — FLUTICASONE FUROATE-VILANTEROL 200-25 MCG/INH IN AEPB: 1.0000 | INHALATION_SPRAY | Freq: Every day | RESPIRATORY_TRACT | Status: DC

## 2015-07-10 MED ORDER — 0.9 % SODIUM CHLORIDE (POUR BTL) OPTIME
TOPICAL | Status: DC | PRN
  Administered 2015-07-10: 1000 mL

## 2015-07-10 MED ORDER — MEPERIDINE HCL 25 MG/ML IJ SOLN: 6.2500 mg | INTRAMUSCULAR | Status: DC | PRN

## 2015-07-10 MED ORDER — MIRABEGRON ER 25 MG PO TB24
25.0000 mg | ORAL_TABLET | Freq: Every day | ORAL | Status: DC
  Administered 2015-07-11: 25 mg via ORAL
  Filled 2015-07-10: qty 1

## 2015-07-10 MED ORDER — HYDROCODONE-ACETAMINOPHEN 5-325 MG PO TABS
1.0000 | ORAL_TABLET | ORAL | Status: DC | PRN
  Administered 2015-07-10 – 2015-07-11 (×2): 2 via ORAL
  Filled 2015-07-10 (×2): qty 2

## 2015-07-10 MED ORDER — KETOROLAC TROMETHAMINE 0.5 % OP SOLN
1.0000 [drp] | Freq: Four times a day (QID) | OPHTHALMIC | Status: DC
  Administered 2015-07-10 – 2015-07-11 (×4): 1 [drp] via OPHTHALMIC
  Filled 2015-07-10: qty 3

## 2015-07-10 MED ORDER — VITAMIN D3 25 MCG (1000 UNIT) PO TABS
1000.0000 [IU] | ORAL_TABLET | ORAL | Status: DC
  Administered 2015-07-10: 1000 [IU] via ORAL
  Filled 2015-07-10 (×2): qty 1

## 2015-07-10 MED ORDER — KETOROLAC TROMETHAMINE 30 MG/ML IJ SOLN
30.0000 mg | Freq: Four times a day (QID) | INTRAMUSCULAR | Status: DC
  Administered 2015-07-10 – 2015-07-11 (×4): 30 mg via INTRAVENOUS
  Filled 2015-07-10 (×4): qty 1

## 2015-07-10 SURGICAL SUPPLY — 59 items
ALLOGRAFT CA 6X14X11 (Bone Implant) ×4 IMPLANT
BAG DECANTER FOR FLEXI CONT (MISCELLANEOUS) ×2 IMPLANT
BIT DRILL NEURO 2X3.1 SFT TUCH (MISCELLANEOUS) ×1 IMPLANT
BLADE ULTRA TIP 2M (BLADE) ×2 IMPLANT
BRUSH SCRUB EZ PLAIN DRY (MISCELLANEOUS) ×2 IMPLANT
CANISTER SUCT 3000ML PPV (MISCELLANEOUS) ×2 IMPLANT
COVER MAYO STAND STRL (DRAPES) ×2 IMPLANT
DECANTER SPIKE VIAL GLASS SM (MISCELLANEOUS) ×2 IMPLANT
DERMABOND ADVANCED (GAUZE/BANDAGES/DRESSINGS) ×1
DERMABOND ADVANCED .7 DNX12 (GAUZE/BANDAGES/DRESSINGS) ×1 IMPLANT
DRAPE LAPAROTOMY 100X72 PEDS (DRAPES) ×2 IMPLANT
DRAPE MICROSCOPE LEICA (MISCELLANEOUS) ×2 IMPLANT
DRAPE POUCH INSTRU U-SHP 10X18 (DRAPES) ×2 IMPLANT
DRAPE PROXIMA HALF (DRAPES) ×2 IMPLANT
DRILL NEURO 2X3.1 SOFT TOUCH (MISCELLANEOUS) ×2
DURASEAL APPLICATOR TIP (TIP) ×2 IMPLANT
DURASEAL SPINE SEALANT 3ML (MISCELLANEOUS) ×2 IMPLANT
ELECT COATED BLADE 2.86 ST (ELECTRODE) ×2 IMPLANT
ELECT REM PT RETURN 9FT ADLT (ELECTROSURGICAL) ×2
ELECTRODE REM PT RTRN 9FT ADLT (ELECTROSURGICAL) ×1 IMPLANT
GLOVE BIO SURGEON STRL SZ8 (GLOVE) ×2 IMPLANT
GLOVE BIO SURGEON STRL SZ8.5 (GLOVE) ×2 IMPLANT
GLOVE BIOGEL PI IND STRL 7.0 (GLOVE) ×6 IMPLANT
GLOVE BIOGEL PI IND STRL 7.5 (GLOVE) ×1 IMPLANT
GLOVE BIOGEL PI IND STRL 8 (GLOVE) ×1 IMPLANT
GLOVE BIOGEL PI INDICATOR 7.0 (GLOVE) ×6
GLOVE BIOGEL PI INDICATOR 7.5 (GLOVE) ×1
GLOVE BIOGEL PI INDICATOR 8 (GLOVE) ×1
GLOVE ECLIPSE 7.5 STRL STRAW (GLOVE) ×6 IMPLANT
GLOVE EXAM NITRILE LRG STRL (GLOVE) IMPLANT
GLOVE EXAM NITRILE MD LF STRL (GLOVE) IMPLANT
GLOVE EXAM NITRILE XL STR (GLOVE) IMPLANT
GLOVE EXAM NITRILE XS STR PU (GLOVE) IMPLANT
GOWN STRL REUS W/ TWL LRG LVL3 (GOWN DISPOSABLE) ×2 IMPLANT
GOWN STRL REUS W/ TWL XL LVL3 (GOWN DISPOSABLE) ×1 IMPLANT
GOWN STRL REUS W/TWL 2XL LVL3 (GOWN DISPOSABLE) IMPLANT
GOWN STRL REUS W/TWL LRG LVL3 (GOWN DISPOSABLE) ×2
GOWN STRL REUS W/TWL XL LVL3 (GOWN DISPOSABLE) ×1
GRAFT DURAGEN MATRIX 1WX1L (Tissue) ×2 IMPLANT
HALTER HD/CHIN CERV TRACTION D (MISCELLANEOUS) ×2 IMPLANT
HEMOSTAT POWDER KIT SURGIFOAM (HEMOSTASIS) ×2 IMPLANT
KIT BASIN OR (CUSTOM PROCEDURE TRAY) ×2 IMPLANT
KIT ROOM TURNOVER OR (KITS) ×2 IMPLANT
NEEDLE HYPO 25X1 1.5 SAFETY (NEEDLE) ×2 IMPLANT
NEEDLE SPNL 22GX3.5 QUINCKE BK (NEEDLE) ×4 IMPLANT
NS IRRIG 1000ML POUR BTL (IV SOLUTION) ×2 IMPLANT
PACK LAMINECTOMY NEURO (CUSTOM PROCEDURE TRAY) ×2 IMPLANT
PAD ARMBOARD 7.5X6 YLW CONV (MISCELLANEOUS) ×6 IMPLANT
PLATE AVIATOR ASSY 2LVL SZ 32 (Plate) ×2 IMPLANT
RUBBERBAND STERILE (MISCELLANEOUS) ×4 IMPLANT
SCREW AVIATOR VAR SELFTAP 4X14 (Screw) ×12 IMPLANT
SPONGE INTESTINAL PEANUT (DISPOSABLE) ×2 IMPLANT
SPONGE SURGIFOAM ABS GEL 100 (HEMOSTASIS) ×2 IMPLANT
STAPLER SKIN PROX WIDE 3.9 (STAPLE) ×2 IMPLANT
SUT VIC AB 2-0 CP2 18 (SUTURE) ×2 IMPLANT
SUT VIC AB 3-0 SH 8-18 (SUTURE) ×2 IMPLANT
TOWEL OR 17X24 6PK STRL BLUE (TOWEL DISPOSABLE) IMPLANT
TOWEL OR 17X26 10 PK STRL BLUE (TOWEL DISPOSABLE) ×2 IMPLANT
WATER STERILE IRR 1000ML POUR (IV SOLUTION) ×2 IMPLANT

## 2015-07-10 NOTE — H&P (Signed)
Subjective: Patient is a 75 y.o. 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 Symptomatically she has pain in the posterior neck and upper back, extending into her upper extremities bilaterally. Patient admitted now for 2 level C5-6 and C6-7 anterior cervical decompression and arthrodesis with structural autograft and cervical plating.    Past Medical History  Diagnosis Date  . Hypertension   . COPD (chronic obstructive pulmonary disease) (HCC)     O2 at nasal prongs  . Asthma   . Diabetes mellitus without complication ([place])     borderline- , states she was on med., but MD told her "everything is under control so I threw the bottle away"  . Depression   . Neuromuscular disorder (HCC)     parkinson, neuropathy- both feet & hands.  . Fibromyalgia   . Arthritis     'all over"  . GERD (gastroesophageal reflux disease)     Past Surgical History  Procedure Laterality Date  . Abdominal hysterectomy    . Shoulder arthroscopy Right     x2   RCR- spurs removed   . Eye surgery Bilateral     cataracts removed, /w "cyrstal lenses"   . Appendectomy      Prescriptions prior to admission  Medication Sig Dispense Refill Last Dose  . acetaminophen (TYLENOL) 500 MG tablet Take 1,000 mg by mouth every 8 (eight) hours as needed for mild pain or moderate pain.   Past Month at Unknown time  . albuterol (PROVENTIL HFA;VENTOLIN HFA) 108 (90 Base) MCG/ACT inhaler Inhale 1-2 puffs into the lungs every 6 (six) hours as needed for wheezing or shortness of breath.   Past Month at Unknown time  . BREO ELLIPTA 200-25 MCG/INH AEPB Take 1 puff by mouth daily before breakfast.   2 07/10/2015 at 0630  . cetirizine (ZYRTEC) 10 MG tablet Take 10 mg by mouth daily.   07/10/2015 at 0630  . cholecalciferol (VITAMIN D) 1000 units tablet Take 1,000 Units by  mouth every other day.   Past Week at Unknown time  . DULoxetine (CYMBALTA) 60 MG capsule Take 60 mg by mouth daily. Told to start some time ago, pt. Admits to noncompliance to medicine- takes sometimes & sometimes not.  Pt. Told to take twice a day per label once she has taken once a day for 14 days.   07/10/2015 at 0630  . esomeprazole (NEXIUM) 20 MG capsule Take 40 mg by mouth daily at 12 noon.   07/10/2015 at 0630  . hydrochlorothiazide (HYDRODIURIL) 25 MG tablet Take 25 mg by mouth daily.  3 07/10/2015 at 0630  . HYDROcodone-acetaminophen (NORCO) 10-325 MG tablet Take 1 tablet by mouth every 8 (eight) hours as needed.  0 07/10/2015 at 0630  . latanoprost (XALATAN) 0.005 % ophthalmic solution Place 1 drop into both eyes once a week.  3 07/09/2015 at Unknown time  . losartan (COZAAR) 50 MG tablet Take 50 mg by mouth daily.  0 07/10/2015 at 0630  . mirabegron ER (MYRBETRIQ) 25 MG TB24 tablet Take 25 mg by mouth daily.   Past Week at Unknown time  . montelukast (SINGULAIR) 10 MG tablet Take 1 tablet by mouth every evening.  3 07/09/2015 at Unknown time  . naproxen (NAPROSYN) 500 MG tablet Take 1 tablet by mouth 2 (two) times daily as needed.  3 07/09/2015 at Unknown time  . OXYGEN Inhale 3-4 L  into the lungs at bedtime.   07/09/2015 at Unknown time  . PREMARIN 0.9 MG tablet Take 0.9 mg by mouth daily after breakfast.   3 07/10/2015 at 0630  . Vitamin D, Ergocalciferol, (DRISDOL) 50000 units CAPS capsule Take 50,000 Units by mouth once a week.  3 Past Week at Unknown time  . zolpidem (AMBIEN) 10 MG tablet Take 10 mg by mouth at bedtime.  1 07/09/2015 at Unknown time   Allergies  Allergen Reactions  . Azithromycin Other (See Comments)    Renal failure  . Sulfamethoxazole-Trimethoprim Other (See Comments)    Renal failure  . Tape Rash    Social History  Substance Use Topics  . Smoking status: Never Smoker   . Smokeless tobacco: Not on file  . Alcohol Use: No    History reviewed. No pertinent family  history.   Review of Systems A comprehensive review of systems was negative.  Objective: Vital signs in last 24 hours: Temp:  [97 F (36.1 C)] 97 F (36.1 C) (02/23 0956) Pulse Rate:  [81] 81 (02/23 0956) Resp:  [20] 20 (02/23 0956) BP: (169)/(65) 169/65 mmHg (02/23 0956) SpO2:  [96 %] 96 % (02/23 0956) Weight:  [92.08 kg (203 lb)] 92.08 kg (203 lb) (02/23 0956)  EXAM: This is a well-developed well-nourished white female in no acute distress. Lungs are clear to auscultation , the patient has symmetrical respiratory excursion. Heart has a regular rate and rhythm normal S1 and S2 no murmur.   Abdomen is soft nontender nondistended bowel sounds are present. Extremity examination shows no clubbing cyanosis or edema. Motor examination shows 5 over 5 strength in the upper extremities including the deltoid biceps triceps and intrinsics and grip. Sensation is intact to pinprick throughout the digits of the upper extremities. Reflexes are symmetrical and without evidence of pathologic reflexes. Patient has a normal gait and stance.   Data Review:CBC    Component Value Date/Time   WBC 10.4 06/27/2015 1428   RBC 4.38 06/27/2015 1428   HGB 12.6 06/27/2015 1428   HCT 38.9 06/27/2015 1428   PLT 413* 06/27/2015 1428   MCV 88.8 06/27/2015 1428   MCH 28.8 06/27/2015 1428   MCHC 32.4 06/27/2015 1428   RDW 13.8 06/27/2015 1428                          BMET    Component Value Date/Time   NA 140 06/27/2015 1428   K 3.9 06/27/2015 1428   CL 101 06/27/2015 1428   CO2 27 06/27/2015 1428   GLUCOSE 180* 06/27/2015 1428   BUN 18 06/27/2015 1428   CREATININE 0.79 06/27/2015 1428   CALCIUM 9.0 06/27/2015 1428   GFRNONAA >60 06/27/2015 1428   GFRAA >60 06/27/2015 1428     Assessment/Plan: Patient with posterior neck and upper back pain, extending to the upper extremities bilaterally, consistent with cervicalgia and bowel cervical radiculopathy, with advanced degeneration at the C5-6 and C6-7  levels who is admitted for 2 level C5-6 and C6-7 ACDF.  I've discussed with the patient the nature of his condition, the nature the surgical procedure, the typical length of surgery, hospital stay, and overall recuperation. We discussed limitations postoperatively. I discussed risks of surgery including risks of infection, bleeding, possibly need for transfusion, the risk of nerve root dysfunction with pain, weakness, numbness, or paresthesias, the risk of spinal cord dysfunction with paralysis of all 4 limbs and quadriplegia, and the risk of dural tear  and CSF leakage and possible need for further surgery, the risk of esophageal dysfunction causing dysphagia and the risk of laryngeal dysfunction causing hoarseness of the voice, the risk of failure of the arthrodesis and the possible need for further surgery, and the risk of anesthetic complications including myocardial infarction, stroke, pneumonia, and death. We also discussed the need for postoperative immobilization in a cervical collar. Understanding all this the patient does wish to proceed with surgery and is admitted for such.    Hosie Spangle, MD 07/10/2015 11:06 AM

## 2015-07-10 NOTE — Op Note (Signed)
07/10/2015  2:33 PM  PATIENT:  Susan Patel  75 y.o. female  PRE-OPERATIVE DIAGNOSIS:  C5-6 and C6-7 spondylitic cervical herniated disc, cervical spondylosis, cervical degenerative disease, cervicalgia, bilateral cervical radiculopathy  POST-OPERATIVE DIAGNOSIS:  C5-6 and C6-7 spondylitic cervical herniated disc, cervical spondylosis, cervical degenerative disease, cervicalgia, bilateral cervical radiculopathy  PROCEDURE:  Procedure(s):  Cervical five-six & Cervical six-seven anterior cervical decompression and arthrodesis with structural allograft and aviator cervical plating  SURGEON:  Surgeon(s): Jovita Gamma, MD  ASSISTANTS: Newman Pies, M.D.  ANESTHESIA:   general  EBL:  Total I/O In: 1200 [I.V.:1200] Out: 100 [Blood:100]  BLOOD ADMINISTERED:none  COUNT: Correct per nursing staff  DICTATION: Patient was brought to the operating room placed under general endotracheal anesthesia. Patient was placed in 10 pounds of halter traction. The neck was prepped with Betadine soap and solution and draped in a sterile fashion. A horizontal incision was made on the left side of the neck. The line of the incision was infiltrated with local anesthetic with epinephrine. Dissection was carried down thru the subcutaneous tissue and platysma, bipolar cautery was used to maintain hemostasis. Dissection was then carried out thru an avascular plane leaving the sternocleidomastoid carotid artery and jugular vein laterally and the trachea and esophagus medially. The ventral aspect of the vertebral column was identified and a localizing x-ray was taken. The C5-6 and C6-7 levels were identified. The annulus at each level was incised and the disc space entered. Discectomy was performed with micro-curettes and pituitary rongeurs. The operating microscope was draped and brought into the field provided additional magnification illumination and visualization. Discectomy was continued posteriorly thru the  disc space and then the cartilaginous endplate was removed using micro-curettes along with the high-speed drill. Posterior osteophytic overgrowth was removed at each level using the high-speed drill along with a 2 mm thin footplated Kerrison punch. Posterior longitudinal ligament along with disc herniation was carefully removed, decompressing the spinal canal and thecal sac. We then continued to remove osteophytic overgrowth and disc material decompressing the neural foramina and exiting nerve roots bilaterally. As we decompressed the right C6-7 neural foramen, removing the spondylitic disc herniation, a small dural tear occurred on the right C7 nerve root sleeve because the disc material was adherent to the dural sleeve. At the C5-6 level hemostasis was established with the use of Gelfoam, which was then removed, and a thin layer of Surgifoam applied. At the C6-7 level hemostasis was established the use of Gelfoam, and then a small pledget of DuraGen was placed over the right C7 nerve root, and then a small amount of DuraSeal was applied.  We then measured the height of the intravertebral disc space level and selected a 6 millimeter in height structural allograft for the C5-6 level and a 6 millimeter in height structural allograft for the C6-7 level . Each was hydrated and saline solution and then gently positioned in the intravertebral disc space and countersunk. We then selected a 32 millimeter in height Aviator cervical plate. It was positioned over the fusion construct and secured to the vertebra with a pair of 4 x 14 mm self-tapping screws at each level. Each screw hole was started with the high-speed drill and then the screws placed, once all the screws were placed, the locking system was secured. The wound was irrigated with bacitracin solution checked for hemostasis which was established and confirmed.  No x-ray was taken due to the localizing x-ray only visualizing down to the C4-5 level, and there is no  visualization below C5. However under direct visualization, the grafts were in good position, and the plate and screws were in good position. We then  proceeded with closure. The platysma was closed with interrupted inverted 2-0 undyed Vicryl suture, the subcutaneous and subcuticular closed with interrupted inverted 3-0 undyed Vicryl suture. The skin edges were approximated with Dermabond. Following surgery the patient was taken out of cervical traction. To be reversed and the anesthetic and taken to the recovery room for further care.   PLAN OF CARE: Admit for overnight observation  PATIENT DISPOSITION:  PACU - hemodynamically stable.   Delay start of Pharmacological VTE agent (>24hrs) due to surgical blood loss or risk of bleeding:  yes

## 2015-07-10 NOTE — Transfer of Care (Signed)
Immediate Anesthesia Transfer of Care Note  Patient: Susan Patel  Procedure(s) Performed: Procedure(s): Cervical five-six, Cervical six-seven anterior cervical decompression with fusion plating and bonegraft (N/A)  Patient Location: PACU  Anesthesia Type:General  Level of Consciousness: awake, alert , patient cooperative and responds to stimulation  Airway & Oxygen Therapy: Patient Spontanous Breathing and Patient connected to nasal cannula oxygen  Post-op Assessment: Report given to RN, Post -op Vital signs reviewed and stable and Patient moving all extremities X 4  Post vital signs: Reviewed and stable  Last Vitals:  Filed Vitals:   07/10/15 0956  BP: 169/65  Pulse: 81  Temp: 36.1 C  Resp: 20    Complications: No apparent anesthesia complications

## 2015-07-10 NOTE — Anesthesia Procedure Notes (Signed)
Procedure Name: Intubation Date/Time: 07/10/2015 11:54 AM Performed by: Tressia Miners LEFFEW Pre-anesthesia Checklist: Patient identified, Patient being monitored, Timeout performed, Emergency Drugs available and Suction available Patient Re-evaluated:Patient Re-evaluated prior to inductionOxygen Delivery Method: Circle System Utilized Preoxygenation: Pre-oxygenation with 100% oxygen Intubation Type: IV induction Ventilation: Mask ventilation without difficulty Laryngoscope Size: Glidescope Grade View: Grade I Tube type: Oral Tube size: 7.0 mm Number of attempts: 1 Airway Equipment and Method: Stylet Placement Confirmation: ETT inserted through vocal cords under direct vision,  positive ETCO2 and breath sounds checked- equal and bilateral Secured at: 22 cm Tube secured with: Tape Dental Injury: Teeth and Oropharynx as per pre-operative assessment

## 2015-07-10 NOTE — Anesthesia Postprocedure Evaluation (Signed)
Anesthesia Post Note  Patient: Susan Patel  Procedure(s) Performed: Procedure(s) (LRB): Cervical five-six, Cervical six-seven anterior cervical decompression with fusion plating and bonegraft (N/A)  Patient location during evaluation: PACU Anesthesia Type: General Level of consciousness: awake and alert and oriented Pain management: pain level controlled Vital Signs Assessment: post-procedure vital signs reviewed and stable Respiratory status: spontaneous breathing, nonlabored ventilation, respiratory function stable and patient connected to nasal cannula oxygen Cardiovascular status: blood pressure returned to baseline and stable Postop Assessment: no signs of nausea or vomiting Anesthetic complications: no    Last Vitals:  Filed Vitals:   07/10/15 1710 07/10/15 1730  BP: 167/70 187/82  Pulse: 82 89  Temp:  36.7 C  Resp: 24 20    Last Pain:  Filed Vitals:   07/10/15 1754  PainSc: 7                  Avaiyah Strubel A.

## 2015-07-10 NOTE — Anesthesia Preprocedure Evaluation (Addendum)
Anesthesia Evaluation  Patient identified by MRN, date of birth, ID band Patient awake    Reviewed: Allergy & Precautions, NPO status , Patient's Chart, lab work & pertinent test results  Airway Mallampati: III  TM Distance: >3 FB Neck ROM: Full    Dental no notable dental hx. (+) Teeth Intact, Caps   Pulmonary asthma , COPD,  COPD inhaler,    Pulmonary exam normal breath sounds clear to auscultation       Cardiovascular hypertension, Pt. on medications Normal cardiovascular exam Rhythm:Regular Rate:Normal     Neuro/Psych PSYCHIATRIC DISORDERS Depression Prrkinson's disease  Neuromuscular disease    GI/Hepatic Neg liver ROS, GERD  Medicated and Controlled,  Endo/Other  diabetes, Poorly Controlled, Type 2Obesity Non-compliant with DM Rx  Renal/GU negative Renal ROS     Musculoskeletal  (+) Arthritis , Osteoarthritis,  Fibromyalgia -Herniated cervical disc   Abdominal (+) + obese,   Peds  Hematology negative hematology ROS (+)   Anesthesia Other Findings   Reproductive/Obstetrics                            Anesthesia Physical Anesthesia Plan  ASA: III  Anesthesia Plan: General   Post-op Pain Management:    Induction: Intravenous  Airway Management Planned: Oral ETT  Additional Equipment:   Intra-op Plan:   Post-operative Plan: Extubation in OR  Informed Consent: I have reviewed the patients History and Physical, chart, labs and discussed the procedure including the risks, benefits and alternatives for the proposed anesthesia with the patient or authorized representative who has indicated his/her understanding and acceptance.   Dental advisory given  Plan Discussed with: CRNA, Anesthesiologist and Surgeon  Anesthesia Plan Comments:         Anesthesia Quick Evaluation

## 2015-07-10 NOTE — Progress Notes (Signed)
Filed Vitals:   07/10/15 1610 07/10/15 1625 07/10/15 1640 07/10/15 1710  BP: 174/72 169/72 164/71 167/70  Pulse: 86 84 81 82  Temp:  98.2 F (36.8 C)    TempSrc:      Resp: 21 19 21 24   Height:      Weight:      SpO2: 89% 94% 92% 94%    Patient resting in bed, complaining of left eye discomfort, eyedrops are being ordered. Incision clean and dry; no swelling, erythema, or drainage. Has not yet voided. Moving all 4 extremities well. Walked from the stretcher to the bed, but has not yet ambulated in the halls.  Plan: Continue to progress to postoperative recovery.  Hosie Spangle, MD 07/10/2015, 5:34 PM

## 2015-07-11 ENCOUNTER — Encounter (HOSPITAL_COMMUNITY): Payer: Self-pay | Admitting: Neurosurgery

## 2015-07-11 DIAGNOSIS — I10 Essential (primary) hypertension: Secondary | ICD-10-CM | POA: Diagnosis not present

## 2015-07-11 DIAGNOSIS — M4722 Other spondylosis with radiculopathy, cervical region: Secondary | ICD-10-CM | POA: Diagnosis not present

## 2015-07-11 DIAGNOSIS — M50122 Cervical disc disorder at C5-C6 level with radiculopathy: Secondary | ICD-10-CM | POA: Diagnosis not present

## 2015-07-11 DIAGNOSIS — F329 Major depressive disorder, single episode, unspecified: Secondary | ICD-10-CM | POA: Diagnosis not present

## 2015-07-11 DIAGNOSIS — J449 Chronic obstructive pulmonary disease, unspecified: Secondary | ICD-10-CM | POA: Diagnosis not present

## 2015-07-11 DIAGNOSIS — J4540 Moderate persistent asthma, uncomplicated: Secondary | ICD-10-CM | POA: Diagnosis not present

## 2015-07-11 MED ORDER — DEXAMETHASONE SODIUM PHOSPHATE 4 MG/ML IJ SOLN
10.0000 mg | Freq: Once | INTRAMUSCULAR | Status: AC
  Administered 2015-07-11: 10 mg via INTRAVENOUS
  Filled 2015-07-11: qty 3

## 2015-07-11 MED ORDER — OXYCODONE-ACETAMINOPHEN 5-325 MG PO TABS: 1.0000 | ORAL_TABLET | ORAL | Status: DC | PRN

## 2015-07-11 MED ORDER — DEXAMETHASONE SODIUM PHOSPHATE 4 MG/ML IJ SOLN
4.0000 mg | Freq: Four times a day (QID) | INTRAMUSCULAR | Status: DC
  Administered 2015-07-11 (×2): 4 mg via INTRAVENOUS
  Filled 2015-07-11 (×2): qty 1

## 2015-07-11 MED ORDER — DEXAMETHASONE 4 MG PO TABS: ORAL_TABLET | ORAL | Status: AC

## 2015-07-11 MED ORDER — CYCLOBENZAPRINE HCL 10 MG PO TABS: 10.0000 mg | ORAL_TABLET | Freq: Three times a day (TID) | ORAL | Status: DC | PRN

## 2015-07-11 NOTE — Discharge Instructions (Signed)
Wound Care °Leave incision open to air. °You may shower. °Do not scrub directly on incision.  °Do not put any creams, lotions, or ointments on incision. °Activity °Walk each and every day, increasing distance each day. °No lifting greater than 5 lbs.  Avoid excessive neck motion. °No driving for 2 weeks; may ride as a passenger locally. °Wear neck brace at all times except when showering.  If provided soft collar, may wear for comfort unless otherwise instructed. °Diet °Resume your normal diet.  °Return to Work °Will be discussed at you follow up appointment. °Call Your Doctor If Any of These Occur °Redness, drainage, or swelling at the wound.  °Temperature greater than 101 degrees. °Severe pain not relieved by pain medication. °Increased difficulty swallowing. °Incision starts to come apart. °Follow Up Appt °Call today for appointment in 3 weeks ([phone]) or for problems.  If you have any hardware placed in your spine, you will need an x-ray before your appointment. ° °Anterior Cervical Diskectomy and Fusion °Anterior cervical diskectomy and fusion is a surgery that is done on the neck (cervical spine) to take pressure off of the nerves or the spinal cord. It is performed through the front (anterior) part of the neck. During this surgery, the damaged disk that is causing pain, numbness, or weakness is removed. The area where the disk was removed is filled with a plastic spacer implant, a bone graft, or both. These implants and bone grafts take pressure off of the nerves and spinal cord by making more room for the nerves to leave the spine. °LET YOUR HEALTH CARE PROVIDER KNOW ABOUT: °· Any allergies you have. °· All medicines you are taking, including vitamins, herbs, eye drops, creams, and over-the-counter medicines. °· Previous problems you or members of your family have had with the use of anesthetics. °· Any blood disorders you have. °· Previous surgeries you have had. °· Any medical conditions you may  have. °RISKS AND COMPLICATIONS °Generally, this is a safe procedure. However, problems may occur, including: °· Infection. °· Bleeding with the possible need for blood transfusion. °· Injury to surrounding structures, including nerves. °· Leakage of fluid from the brain or spinal cord (cerebrospinal fluid). °· Blood clots. °· Temporary breathing difficulties after surgery. °BEFORE THE PROCEDURE °· Follow your health care provider's instructions about eating or drinking restrictions. °· Ask your health care provider about: °¨ Changing or stopping your regular medicines. This is especially important if you are taking diabetes medicines or blood thinners. °¨ Taking medicines such as aspirin and ibuprofen. These medicines can thin your blood. Do not take these medicines before your procedure if your health care provider instructs you not to. °· You may be given antibiotic medicines to help prevent infection. °· Your incision site may be marked on your neck. °PROCEDURE °· An IV tube will be inserted into one of your veins. °· You will be given one or more of the following: °¨ A medicine that helps you relax (sedative). °¨ A medicine that makes you fall asleep (general anesthetic). °· A breathing tube will be placed. °· Your neck will be cleaned with a germ-killing solution (antiseptic). °· Your surgeon will make an incision on the front of your neck, usually within a skinfold line. °· Your neck muscles will be spread apart, and the damaged disk and bone spurs will be removed. °· The area where the disk was removed will be filled with a small plastic spacer implant, a bone graft, or both. °· Your surgeon may   put metal plates and screws (hardware) in your neck. This helps to stabilize the surgical site and keep implants and bone grafts in place. The hardware reduces motion at the surgical site so your bones can grow together (fuse). This provides extra support to your neck. °· The incision will be closed with stitches  (sutures). °· A bandage (dressing) will be applied to cover the incision. °The procedure may vary among health care providers and hospitals. °AFTER THE PROCEDURE °· Your blood pressure, heart rate, breathing rate, and blood oxygen level will be monitored often until the medicines you were given have worn off.  °· You will be monitored for any signs of complications from the procedure, such as: °¨ Too much bleeding from the incision site. °¨ A buildup of blood under your skin at the surgical site. °¨ Difficulty breathing. °· You may continue to receive antibiotics. °· You can start to eat as soon as you feel comfortable. °· You may be given a neck brace to wear after surgery. This brace limits your neck movement while your bones are fusing together. Follow your health care provider's instructions about how often and how long you need to wear this. °  °This information is not intended to replace advice given to you by your health care provider. Make sure you discuss any questions you have with your health care provider. °  °Document Released: 04/21/2009 Document Revised: 05/24/2014 Document Reviewed: 04/21/2009 °Elsevier Interactive Patient Education ©2016 [employer] Inc. ° °  °

## 2015-07-11 NOTE — Progress Notes (Signed)
Pt doing well. Pt and family given D/C instructions with Rx's, verbal understanding was provided. Pt's incision is clean and dry with no sign of infection. Pt's IV was removed prior to D/C. Pt D/C'd home via wheelchair @ 1930 per MD order. Pt is stable @ D/C and has no other needs at this time. Holli Humbles, RN

## 2015-07-11 NOTE — Discharge Summary (Signed)
Physician Discharge Summary  Patient ID: Susan Patel MRN: [id number] DOB/AGE: 01-05-1941 75 y.o.  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 Postoperatively she is done well. She is up and ambulate actively in the halls. She has had mild dysphagia and mild swelling of the neck, and she was started on Decadron, with symptomatic improvement. She is being discharged home with a tapering schedule for Decadron. She is scheduled for follow-up with me in the office in 3 weeks with an x-ray that day. She has been given instructions regarding wound care and activities following discharge.  Discharge Exam: Blood pressure 187/96, pulse 108, temperature 97.7 F (36.5 C), temperature source Oral, resp. rate 20, height 5\' 1"  (1.549 m), weight 92.08 kg (203 lb), SpO2 97 %.  Disposition: home    Medication List    TAKE these medications        acetaminophen 500 MG tablet  Commonly known as:  TYLENOL  Take 1,000 mg by mouth every 8 (eight) hours as needed for mild pain or moderate pain.     albuterol 108 (90 Base) MCG/ACT inhaler  Commonly known as:  PROVENTIL HFA;VENTOLIN HFA  Inhale 1-2 puffs into the lungs every 6 (six) hours as needed for wheezing or shortness of breath.     BREO ELLIPTA 200-25 MCG/INH Aepb  Generic drug:  fluticasone furoate-vilanterol  Take 1 puff by mouth daily before breakfast.     cetirizine 10 MG  tablet  Commonly known as:  ZYRTEC  Take 10 mg by mouth daily.     cholecalciferol 1000 units tablet  Commonly known as:  VITAMIN D  Take 1,000 Units by mouth every other day.     cyclobenzaprine 10 MG tablet  Commonly known as:  FLEXERIL  Take 1 tablet (10 mg total) by mouth 3 (three) times daily as needed for muscle spasms.     dexamethasone 4 MG tablet  Commonly known as:  DECADRON  Use as directed     DULoxetine 60 MG capsule  Commonly known as:  CYMBALTA  Take 60 mg by mouth daily. Told to start some time ago, pt. Admits to noncompliance to medicine- takes sometimes & sometimes not.  Pt. Told to take twice a day per label once she has taken once a day for 14 days.     esomeprazole 20 MG capsule  Commonly known as:  NEXIUM  Take 40 mg by mouth daily at 12 noon.     hydrochlorothiazide 25 MG tablet  Commonly known as:  HYDRODIURIL  Take 25 mg by mouth daily.     HYDROcodone-acetaminophen 10-325 MG tablet  Commonly known as:  NORCO  Take 1 tablet by mouth every 8 (eight) hours as needed.     latanoprost 0.005 % ophthalmic solution  Commonly known as:  XALATAN  Place 1 drop into both eyes once a week.     losartan 50 MG tablet  Commonly known as:  COZAAR  Take 50 mg by mouth daily.     montelukast 10 MG tablet  Commonly known as:  SINGULAIR  Take 1 tablet by mouth every evening.     MYRBETRIQ 25 MG Tb24 tablet  Generic drug:  mirabegron ER  Take 25 mg by mouth daily.     naproxen 500 MG tablet  Commonly known as:  NAPROSYN  Take 1 tablet by mouth 2 (two) times daily as needed.     oxyCODONE-acetaminophen 5-325 MG tablet  Commonly known as:  PERCOCET/ROXICET  Take 1-2 tablets by mouth every 4 (four) hours as needed for moderate pain.     OXYGEN  Inhale 3-4 L into the lungs at bedtime.     PREMARIN 0.9 MG tablet  Generic drug:  estrogens (conjugated)  Take 0.9 mg by mouth daily after breakfast.     Vitamin D (Ergocalciferol) 50000 units Caps capsule   Commonly known as:  DRISDOL  Take 50,000 Units by mouth once a week.     zolpidem 10 MG tablet  Commonly known as:  AMBIEN  Take 10 mg by mouth at bedtime.         SignedHosie Spangle 07/11/2015, 6:53 PM

## 2015-07-11 NOTE — Progress Notes (Signed)
Filed Vitals:   07/10/15 1710 07/10/15 1730 07/10/15 2115 07/11/15 0340  BP: 167/70 187/82 158/49 145/79  Pulse: 82 89 87 95  Temp:  98.1 F (36.7 C) 97.7 F (36.5 C) 97.7 F (36.5 C)  TempSrc:   Oral Oral  Resp: 24 20 18 18   Height:      Weight:      SpO2: 94% 95% 97% 100%    Patient sitting up on the side of the bed, has been ambulating in the halls. Incision clean and dry, but mild swelling in neck. Mild dysphagia, but no dyspnea.  Plan: We'll start on Decadron, giving 10 mg IV now, and then beginning 4 mg IV every 6 hours as of 1200. We'll monitor neck swelling and dysphagia. Counseled patient about adjustment to diet because of swelling and dysphagia. Encourage patient to ambulate in the halls.   Hosie Spangle, MD 07/11/2015, 7:26 AM

## 2015-07-25 DIAGNOSIS — I10 Essential (primary) hypertension: Secondary | ICD-10-CM | POA: Diagnosis not present

## 2015-07-25 DIAGNOSIS — M4722 Other spondylosis with radiculopathy, cervical region: Secondary | ICD-10-CM | POA: Diagnosis not present

## 2015-07-25 DIAGNOSIS — Z981 Arthrodesis status: Secondary | ICD-10-CM | POA: Diagnosis not present

## 2015-07-25 DIAGNOSIS — M5030 Other cervical disc degeneration, unspecified cervical region: Secondary | ICD-10-CM | POA: Diagnosis not present

## 2015-07-25 DIAGNOSIS — M5020 Other cervical disc displacement, unspecified cervical region: Secondary | ICD-10-CM | POA: Diagnosis not present

## 2015-07-25 DIAGNOSIS — Z6841 Body Mass Index (BMI) 40.0 and over, adult: Secondary | ICD-10-CM | POA: Diagnosis not present

## 2015-08-08 DIAGNOSIS — M542 Cervicalgia: Secondary | ICD-10-CM | POA: Diagnosis not present

## 2015-08-08 DIAGNOSIS — M5030 Other cervical disc degeneration, unspecified cervical region: Secondary | ICD-10-CM | POA: Diagnosis not present

## 2015-08-08 DIAGNOSIS — Z6838 Body mass index (BMI) 38.0-38.9, adult: Secondary | ICD-10-CM | POA: Diagnosis not present

## 2015-08-08 DIAGNOSIS — Z981 Arthrodesis status: Secondary | ICD-10-CM | POA: Diagnosis not present

## 2015-08-08 DIAGNOSIS — M4722 Other spondylosis with radiculopathy, cervical region: Secondary | ICD-10-CM | POA: Diagnosis not present

## 2015-08-12 DIAGNOSIS — M5030 Other cervical disc degeneration, unspecified cervical region: Secondary | ICD-10-CM | POA: Diagnosis not present

## 2015-08-12 DIAGNOSIS — J209 Acute bronchitis, unspecified: Secondary | ICD-10-CM | POA: Diagnosis not present

## 2015-08-12 DIAGNOSIS — B370 Candidal stomatitis: Secondary | ICD-10-CM | POA: Diagnosis not present

## 2015-08-12 DIAGNOSIS — B373 Candidiasis of vulva and vagina: Secondary | ICD-10-CM | POA: Diagnosis not present

## 2015-08-21 ENCOUNTER — Encounter: Payer: Self-pay | Admitting: Family

## 2015-08-21 ENCOUNTER — Ambulatory Visit (INDEPENDENT_AMBULATORY_CARE_PROVIDER_SITE_OTHER): Payer: Medicare Other | Admitting: Family

## 2015-08-21 VITALS — BP 158/92 | HR 111 | Temp 97.0°F | Ht 61.0 in | Wt 196.4 lb

## 2015-08-21 DIAGNOSIS — R112 Nausea with vomiting, unspecified: Secondary | ICD-10-CM

## 2015-08-21 MED ORDER — ONDANSETRON HCL 4 MG PO TABS: 4.0000 mg | ORAL_TABLET | Freq: Three times a day (TID) | ORAL | Status: DC | PRN

## 2015-08-21 NOTE — Progress Notes (Signed)
   Subjective:    Patient ID: Susan Patel, female    DOB: Sep 26, 1940, 76 y.o.   MRN: [id number]  Pt presents to the office today to establish care. PT is complaining of emesis and abdominal pain. PT had cervical surgical about 6 weeks ago. PT since surgery her nausea and vomiting is worse.  Emesis  This is a new problem. The current episode started 1 to 4 weeks ago. The problem occurs 2 to 4 times per day. The problem has been unchanged. The emesis has an appearance of stomach contents. There has been no fever. Associated symptoms include coughing and myalgias. Pertinent negatives include no chills, fever or headaches. She has tried diet change and bed rest for the symptoms. The treatment provided no relief.      Review of Systems  Constitutional: Negative.  Negative for fever and chills.  HENT: Negative.   Eyes: Negative.   Respiratory: Positive for cough. Negative for shortness of breath.   Cardiovascular: Negative.  Negative for palpitations.  Gastrointestinal: Positive for vomiting.  Endocrine: Negative.   Genitourinary: Negative.   Musculoskeletal: Positive for myalgias.  Neurological: Negative.  Negative for headaches.  Hematological: Negative.   Psychiatric/Behavioral: Negative.   All other systems reviewed and are negative.      Objective:   Physical Exam  Constitutional: She is oriented to person, place, and time. She appears well-developed and well-nourished. No distress.  HENT:  Head: Normocephalic and atraumatic.  Eyes: Pupils are equal, round, and reactive to light.  Neck: Normal range of motion. Neck supple. No thyromegaly present.  Cardiovascular: Normal rate, regular rhythm, normal heart sounds and intact distal pulses.   No murmur heard. Pulmonary/Chest: Effort normal and breath sounds normal. No respiratory distress. She has no wheezes.  Abdominal: Soft. Bowel sounds are normal. She exhibits no distension. There is tenderness (generalized abd tenderness).    Musculoskeletal: Normal range of motion. She exhibits no edema or tenderness.  Neurological: She is alert and oriented to person, place, and time.  Skin: Skin is warm and dry.  Psychiatric: She has a normal mood and affect. Her behavior is normal. Judgment and thought content normal.  Vitals reviewed.     BP 158/92 mmHg  Pulse 111  Temp(Src) 97 F (36.1 C) (Oral)  Ht _0  (1.549 m)  Wt 196 lb 6.4 oz (89.086 kg)  BMI 37.13 kg/m2     Assessment & Plan:  1. Nausea and vomiting, intractability of vomiting not specified, unspecified vomiting type -PT told to zofran 20 mins before eating to see if patient is able to keep food down -Keep all appts with neurogliosis -Continue PPI -Avoid fried or fatty foods -RTO prn  - Amylase - Lipase - CMP14+EGFR - CBC with Differential/Platelet - ondansetron (ZOFRAN) 4 MG tablet; Take 1 tablet (4 mg total) by mouth every 8 (eight) hours as needed for nausea or vomiting.  Dispense: 30 tablet; Refill: [place], FNP

## 2015-08-21 NOTE — Patient Instructions (Signed)

## 2015-08-22 ENCOUNTER — Other Ambulatory Visit: Payer: Self-pay | Admitting: Family

## 2015-08-22 DIAGNOSIS — E1165 Type 2 diabetes mellitus with hyperglycemia: Secondary | ICD-10-CM

## 2015-08-22 DIAGNOSIS — E119 Type 2 diabetes mellitus without complications: Secondary | ICD-10-CM | POA: Insufficient documentation

## 2015-08-22 LAB — CMP14+EGFR
A/G RATIO: 1.3 (ref 1.2–2.2)
ALBUMIN: 3.9 g/dL (ref 3.5–4.8)
ALK PHOS: 116 IU/L (ref 39–117)
ALT: 25 IU/L (ref 0–32)
AST: 34 IU/L (ref 0–40)
BUN / CREAT RATIO: 18 (ref 12–28)
BUN: 27 mg/dL (ref 8–27)
Bilirubin Total: 0.6 mg/dL (ref 0.0–1.2)
CALCIUM: 8.8 mg/dL (ref 8.7–10.3)
CO2: 25 mmol/L (ref 18–29)
CREATININE: 1.5 mg/dL — AB (ref 0.57–1.00)
Chloride: 82 mmol/L — ABNORMAL LOW (ref 96–106)
GFR calc Af Amer: 40 mL/min/{1.73_m2} — ABNORMAL LOW (ref >59)
GFR, EST NON AFRICAN AMERICAN: 35 mL/min/{1.73_m2} — AB (ref >59)
GLOBULIN, TOTAL: 2.9 g/dL (ref 1.5–4.5)
Glucose: 233 mg/dL — ABNORMAL HIGH (ref 65–99)
POTASSIUM: 3.4 mmol/L — AB (ref 3.5–5.2)
SODIUM: 130 mmol/L — AB (ref 134–144)
Total Protein: 6.8 g/dL (ref 6.0–8.5)

## 2015-08-22 LAB — CBC WITH DIFFERENTIAL/PLATELET
BASOS: 0 %
Basophils Absolute: 0 10*3/uL (ref 0.0–0.2)
EOS (ABSOLUTE): 0.1 10*3/uL (ref 0.0–0.4)
EOS: 1 %
HEMATOCRIT: 39.3 % (ref 34.0–46.6)
Hemoglobin: 13.2 g/dL (ref 11.1–15.9)
IMMATURE GRANULOCYTES: 1 %
Immature Grans (Abs): 0.1 10*3/uL (ref 0.0–0.1)
LYMPHS ABS: 1.9 10*3/uL (ref 0.7–3.1)
Lymphs: 22 %
MCH: 29.1 pg (ref 26.6–33.0)
MCHC: 33.6 g/dL (ref 31.5–35.7)
MCV: 87 fL (ref 79–97)
MONOS ABS: 1.1 10*3/uL — AB (ref 0.1–0.9)
Monocytes: 13 %
NEUTROS ABS: 5.5 10*3/uL (ref 1.4–7.0)
NEUTROS PCT: 63 %
PLATELETS: 457 10*3/uL — AB (ref 150–379)
RBC: 4.53 x10E6/uL (ref 3.77–5.28)
RDW: 13.9 % (ref 12.3–15.4)
WBC: 8.6 10*3/uL (ref 3.4–10.8)

## 2015-08-22 LAB — LIPASE: Lipase: 17 U/L (ref 0–59)

## 2015-08-22 LAB — AMYLASE: AMYLASE: 20 U/L — AB (ref 31–124)

## 2015-08-22 MED ORDER — METFORMIN HCL 500 MG PO TABS: 500.0000 mg | ORAL_TABLET | Freq: Two times a day (BID) | ORAL | Status: DC

## 2015-08-25 ENCOUNTER — Telehealth: Payer: Self-pay | Admitting: Family

## 2015-08-25 NOTE — Telephone Encounter (Signed)
Aware of results and follow up appointments were made.

## 2015-08-26 DIAGNOSIS — N289 Disorder of kidney and ureter, unspecified: Secondary | ICD-10-CM | POA: Diagnosis not present

## 2015-08-26 DIAGNOSIS — I7300 Raynaud's syndrome without gangrene: Secondary | ICD-10-CM | POA: Insufficient documentation

## 2015-08-26 DIAGNOSIS — J45909 Unspecified asthma, uncomplicated: Secondary | ICD-10-CM | POA: Insufficient documentation

## 2015-08-26 DIAGNOSIS — E559 Vitamin D deficiency, unspecified: Secondary | ICD-10-CM | POA: Insufficient documentation

## 2015-08-26 DIAGNOSIS — E876 Hypokalemia: Secondary | ICD-10-CM | POA: Diagnosis not present

## 2015-08-26 DIAGNOSIS — J301 Allergic rhinitis due to pollen: Secondary | ICD-10-CM | POA: Diagnosis not present

## 2015-08-26 DIAGNOSIS — K2100 Gastro-esophageal reflux disease with esophagitis, without bleeding: Secondary | ICD-10-CM | POA: Insufficient documentation

## 2015-08-26 DIAGNOSIS — M4800 Spinal stenosis, site unspecified: Secondary | ICD-10-CM | POA: Insufficient documentation

## 2015-08-26 DIAGNOSIS — E782 Mixed hyperlipidemia: Secondary | ICD-10-CM | POA: Diagnosis not present

## 2015-08-26 DIAGNOSIS — R112 Nausea with vomiting, unspecified: Secondary | ICD-10-CM | POA: Diagnosis not present

## 2015-08-26 DIAGNOSIS — J4540 Moderate persistent asthma, uncomplicated: Secondary | ICD-10-CM | POA: Diagnosis not present

## 2015-08-26 DIAGNOSIS — B379 Candidiasis, unspecified: Secondary | ICD-10-CM | POA: Diagnosis not present

## 2015-08-26 DIAGNOSIS — E119 Type 2 diabetes mellitus without complications: Secondary | ICD-10-CM | POA: Diagnosis not present

## 2015-09-04 ENCOUNTER — Encounter: Payer: Self-pay | Admitting: Family

## 2015-09-04 ENCOUNTER — Ambulatory Visit (INDEPENDENT_AMBULATORY_CARE_PROVIDER_SITE_OTHER): Payer: Medicare Other | Admitting: Family

## 2015-09-04 VITALS — BP 153/91 | HR 100 | Temp 97.0°F | Ht 61.0 in | Wt 197.8 lb

## 2015-09-04 DIAGNOSIS — E1165 Type 2 diabetes mellitus with hyperglycemia: Secondary | ICD-10-CM

## 2015-09-04 DIAGNOSIS — Z1239 Encounter for other screening for malignant neoplasm of breast: Secondary | ICD-10-CM

## 2015-09-04 DIAGNOSIS — E669 Obesity, unspecified: Secondary | ICD-10-CM

## 2015-09-04 DIAGNOSIS — K5900 Constipation, unspecified: Secondary | ICD-10-CM | POA: Diagnosis not present

## 2015-09-04 DIAGNOSIS — Z1211 Encounter for screening for malignant neoplasm of colon: Secondary | ICD-10-CM | POA: Diagnosis not present

## 2015-09-04 DIAGNOSIS — I10 Essential (primary) hypertension: Secondary | ICD-10-CM | POA: Insufficient documentation

## 2015-09-04 LAB — BAYER DCA HB A1C WAIVED: HB A1C (BAYER DCA - WAIVED): 8.9 % — ABNORMAL HIGH (ref <7.0)

## 2015-09-04 MED ORDER — LOSARTAN POTASSIUM 100 MG PO TABS: 100.0000 mg | ORAL_TABLET | Freq: Every day | ORAL | Status: DC

## 2015-09-04 MED ORDER — ASPIRIN EC 81 MG PO TBEC: 81.0000 mg | DELAYED_RELEASE_TABLET | Freq: Every day | ORAL | Status: DC

## 2015-09-04 MED ORDER — POLYETHYLENE GLYCOL 3350 17 GM/SCOOP PO POWD: ORAL | Status: DC

## 2015-09-04 NOTE — Patient Instructions (Signed)
Nausea, Adult °Nausea is the feeling that you have an upset stomach or have to vomit. Nausea by itself is not likely a serious concern, but it may be an early sign of more serious medical problems. As nausea gets worse, it can lead to vomiting. If vomiting develops, there is the risk of dehydration.  °CAUSES  °· Viral infections. °· Food poisoning. °· Medicines. °· Pregnancy. °· Motion sickness. °· Migraine headaches. °· Emotional distress. °· Severe pain from any source. °· Alcohol intoxication. °HOME CARE INSTRUCTIONS °· Get plenty of rest. °· Ask your caregiver about specific rehydration instructions. °· Eat small amounts of food and sip liquids more often. °· Take all medicines as told by your caregiver. °SEEK MEDICAL CARE IF: °· You have not improved after 2 days, or you get worse. °· You have a headache. °SEEK IMMEDIATE MEDICAL CARE IF:  °· You have a fever. °· You faint. °· You keep vomiting or have blood in your vomit. °· You are extremely weak or dehydrated. °· You have dark or bloody stools. °· You have severe chest or abdominal pain. °MAKE SURE YOU: °· Understand these instructions. °· Will watch your condition. °· Will get help right away if you are not doing well or get worse. °  °This information is not intended to replace advice given to you by your health care provider. Make sure you discuss any questions you have with your health care provider. °  °Document Released: 06/10/2004 Document Revised: 05/24/2014 Document Reviewed: 01/13/2011 °Elsevier Interactive Patient Education ©2016 [employer] Inc. ° °

## 2015-09-04 NOTE — Progress Notes (Signed)
Subjective:    Patient ID: Susan Patel, female    DOB: 06-Mar-1941, 75 y.o.   MRN: [id number]  Pt presents to the office today to recheck DM 2. PT is a new diabetic. Pt had been diagnosed as a diabetic last year, but was able to control it was diet and exercise. Pt's last glucose in the office was 233 on 04/06/217. Pt was started on metformin, but states she was unable to tolerate. Pt was started on amaryl 2 mg. PT has appt with Tammy on 08/16/15 for diabetic education. PT states she is nauseous constantly. PT states she can not see where the zofran.   Diabetes She presents for her initial diabetic visit. She has type 2 diabetes mellitus. Her disease course has been worsening. There are no hypoglycemic associated symptoms. Associated symptoms include foot paresthesias. Pertinent negatives for diabetes include no blurred vision, no visual change and no weakness. Diabetic complications include peripheral neuropathy. Pertinent negatives for diabetic complications include no CVA or heart disease. Risk factors for coronary artery disease include obesity, hypertension, stress, sedentary lifestyle and post-menopausal. Current diabetic treatment includes oral agent (monotherapy). She is following a generally unhealthy diet. (Do not take blood sugars at home ) An ACE inhibitor/angiotensin II receptor blocker is being taken. Eye exam is not current.      Review of Systems  Eyes: Negative for blurred vision.  Neurological: Negative for weakness.  All other systems reviewed and are negative.      Objective:   Physical Exam  Constitutional: She is oriented to person, place, and time. She appears well-developed and well-nourished. No distress.  HENT:  Head: Normocephalic.  Eyes: Pupils are equal, round, and reactive to light.  Neck: Normal range of motion. Neck supple. No thyromegaly present.  Cardiovascular: Normal rate, regular rhythm, normal heart sounds and intact distal pulses.   No murmur  heard. Pulmonary/Chest: Effort normal and breath sounds normal. No respiratory distress. She has no wheezes.  Abdominal: Soft. Bowel sounds are normal. She exhibits no distension. There is no tenderness.  Musculoskeletal: Normal range of motion. She exhibits no edema or tenderness.  Neurological: She is alert and oriented to person, place, and time.  Skin: Skin is warm and dry.  Psychiatric: She has a normal mood and affect. Her behavior is normal. Judgment and thought content normal.  Vitals reviewed.     BP 153/91 mmHg  Pulse 100  Temp(Src) 97 F (36.1 C) (Oral)  Ht _0  (1.549 m)  Wt 197 lb 12.8 oz (89.721 kg)  BMI 37.39 kg/m2     Assessment & Plan:  1. Obesity (BMI 30-39.9) - CMP14+EGFR  2. Type 2 diabetes mellitus with hyperglycemia, without long-term current use of insulin (HCC) - Bayer DCA Hb A1c Waived - CMP14+EGFR - Microalbumin / creatinine urine ratio  3. Essential hypertension -Pt's losartan increased to 100 mg from 50 mg - CMP14+EGFR - losartan (COZAAR) 100 MG tablet; Take 1 tablet (100 mg total) by mouth daily.  Dispense: 90 tablet; Refill: 3  4. Colon cancer screening - CMP14+EGFR - Fecal occult blood, imunochemical; Future  5. Breast cancer screening - CMP14+EGFR - [facility] MAMMOGRAPHY  6. Constipation, unspecified constipation type - CMP14+EGFR - polyethylene glycol powder (GLYCOLAX/MIRALAX) powder; TAKE 1 CAPFUL IN 8 OUNCES OF WATER JUICE OR TEA AND DRINK DAILY  Dispense: 255 g; Refill: 0   Continue all meds Labs pending Health Maintenance reviewed Diet and exercise encouraged RTO 2 weeks to recheck HTN  Evelina Dun, FNP

## 2015-09-05 LAB — CMP14+EGFR
A/G RATIO: 1.3 (ref 1.2–2.2)
ALBUMIN: 3.6 g/dL (ref 3.5–4.8)
ALK PHOS: 103 IU/L (ref 39–117)
ALT: 30 IU/L (ref 0–32)
AST: 44 IU/L — ABNORMAL HIGH (ref 0–40)
BILIRUBIN TOTAL: 0.7 mg/dL (ref 0.0–1.2)
BUN / CREAT RATIO: 8 — AB (ref 12–28)
BUN: 8 mg/dL (ref 8–27)
CHLORIDE: 95 mmol/L — AB (ref 96–106)
CO2: 23 mmol/L (ref 18–29)
Calcium: 9.1 mg/dL (ref 8.7–10.3)
Creatinine, Ser: 0.98 mg/dL (ref 0.57–1.00)
GFR calc non Af Amer: 58 mL/min/{1.73_m2} — ABNORMAL LOW (ref >59)
GFR, EST AFRICAN AMERICAN: 67 mL/min/{1.73_m2} (ref >59)
GLUCOSE: 117 mg/dL — AB (ref 65–99)
Globulin, Total: 2.8 g/dL (ref 1.5–4.5)
POTASSIUM: 4.4 mmol/L (ref 3.5–5.2)
SODIUM: 136 mmol/L (ref 134–144)
TOTAL PROTEIN: 6.4 g/dL (ref 6.0–8.5)

## 2015-09-05 LAB — MICROALBUMIN / CREATININE URINE RATIO
CREATININE, UR: 158.3 mg/dL
MICROALB/CREAT RATIO: 15.5 mg/g creat (ref 0.0–30.0)
Microalbumin, Urine: 24.5 ug/mL

## 2015-09-15 ENCOUNTER — Encounter (INDEPENDENT_AMBULATORY_CARE_PROVIDER_SITE_OTHER): Payer: Self-pay

## 2015-09-15 ENCOUNTER — Ambulatory Visit (INDEPENDENT_AMBULATORY_CARE_PROVIDER_SITE_OTHER): Payer: Medicare Other | Admitting: Pharmacist

## 2015-09-15 VITALS — BP 136/72 | HR 80 | Ht 61.0 in | Wt 198.0 lb

## 2015-09-15 DIAGNOSIS — E1165 Type 2 diabetes mellitus with hyperglycemia: Secondary | ICD-10-CM | POA: Diagnosis not present

## 2015-09-15 DIAGNOSIS — Z1211 Encounter for screening for malignant neoplasm of colon: Secondary | ICD-10-CM | POA: Diagnosis not present

## 2015-09-15 MED ORDER — BLOOD GLUCOSE MONITOR KIT: PACK | Status: DC

## 2015-09-15 NOTE — Patient Instructions (Signed)
Diabetes and Standards of Medical Care   Diabetes is complicated. You may find that your diabetes team includes a dietitian, nurse, diabetes educator, eye doctor, and more. To help everyone know what is going on and to help you get the care you deserve, the following schedule of care was developed to help keep you on track. Below are the tests, exams, vaccines, medicines, education, and plans you will need.  Blood Glucose Goals Prior to meals = 80 - 130 Within 2 hours of the start of a meal = less than 180  HbA1c test (goal is less than 7.0% - your last value was 8.9%) This test shows how well you have controlled your glucose over the past 2 to 3 months. It is used to see if your diabetes management plan needs to be adjusted.   It is performed at least 2 times a year if you are meeting treatment goals.  It is performed 4 times a year if therapy has changed or if you are not meeting treatment goals.  Blood pressure test  This test is performed at every routine medical visit. The goal is less than 140/90 mmHg for most people, but 130/80 mmHg in some cases. Ask your health care provider about your goal.  Dental exam  Follow up with the dentist regularly.  Eye exam  If you are diagnosed with type 1 diabetes as a child, get an exam upon reaching the age of 22 years or older and have had diabetes for 3 to 5 years. Yearly eye exams are recommended after that initial eye exam.  If you are diagnosed with type 1 diabetes as an adult, get an exam within 5 years of diagnosis and then yearly.  If you are diagnosed with type 2 diabetes, get an exam as soon as possible after the diagnosis and then yearly.  Foot care exam  Visual foot exams are performed at every routine medical visit. The exams check for cuts, injuries, or other problems with the feet.  A comprehensive foot exam should be done yearly. This includes visual inspection as well as assessing foot pulses and testing for loss of  sensation.  Check your feet nightly for cuts, injuries, or other problems with your feet. Tell your health care provider if anything is not healing.  Kidney function test (urine microalbumin)  This test is performed once a year.  Type 1 diabetes: The first test is performed 5 years after diagnosis.  Type 2 diabetes: The first test is performed at the time of diagnosis.  A serum creatinine and estimated glomerular filtration rate (eGFR) test is done once a year to assess the level of chronic kidney disease (CKD), if present.  Lipid profile (cholesterol, HDL, LDL, triglycerides)  Performed every 5 years for most people.  The goal for LDL is less than 100 mg/dL. If you are at high risk, the goal is less than 70 mg/dL.  The goal for HDL is 40 mg/dL to 50 mg/dL for men and 50 mg/dL to 60 mg/dL for women. An HDL cholesterol of 60 mg/dL or higher gives some protection against heart disease.  The goal for triglycerides is less than 150 mg/dL.  Influenza vaccine, pneumococcal vaccine, and hepatitis B vaccine  The influenza vaccine is recommended yearly.  The pneumococcal vaccine is generally given once in a lifetime. However, there are some instances when another vaccination is recommended. Check with your health care provider.  The hepatitis B vaccine is also recommended for adults with diabetes.  Diabetes self-management education  Education is recommended at diagnosis and ongoing as needed.  Treatment plan  Your treatment plan is reviewed at every medical visit.  Document Released: 02/28/2009 Document Revised: 01/03/2013 Document Reviewed: 10/03/2012 [employer] Patient Information 2014 [employer].   

## 2015-09-16 ENCOUNTER — Encounter: Payer: Self-pay | Admitting: Pharmacist

## 2015-09-16 LAB — FECAL OCCULT BLOOD, IMMUNOCHEMICAL: FECAL OCCULT BLD: POSITIVE — AB

## 2015-09-16 NOTE — Progress Notes (Signed)
Subjective:    Susan Patel is a 76 y.o. female who presents for an initial evaluation of Type 2 diabetes mellitus.  She was diagnosed with type 2 DM about 1 year ago per patient. Current symptoms/problems include hyperglycemia and have been unchanged. Susan Patel's last A1c was 8.9% (09/04/2015).  She is currently taking only glimepiride 2mg  1 tablet daily (started about 1 month ago).  She has tried metformin but this was stopped due to side effect of stomach discomfort.  She is not checking BG at home.  She reports that she has never has formal education about diet and diabetes.   Eye exam current (within one year): unknown Weight trend: stable Prior visit with dietician: no Current diet: in general, an "unhealthy" diet Current exercise: none   Is She on ACE inhibitor or angiotensin II receptor blocker?  Yes    losartan (Cozaar)    Objective:    BP 136/72 mmHg  Pulse 80  Ht 5\' 1"  (1.549 m)  Wt 198 lb (89.812 kg)  BMI 37.43 kg/m2   Lab Review GLUCOSE (mg/dL)  Date Value  09/04/2015 117*  08/21/2015 233*   GLUCOSE, BLD (mg/dL)  Date Value  06/27/2015 180*  07/07/2012 105*   CO2 (mmol/L)  Date Value  09/04/2015 23  08/21/2015 25  06/27/2015 27   BUN (mg/dL)  Date Value  09/04/2015 8  08/21/2015 27  06/27/2015 18  07/07/2012 19   CREATININE, SER (mg/dL)  Date Value  09/04/2015 0.98  08/21/2015 1.50*  06/27/2015 0.79   A1C = 8.9% (09/04/2015)    Assessment:    Diabetes Mellitus type II, under inadequate control.    Plan:    1.  Rx changes: continue glimepiride 2mg  - take 1 tablet daily 2.  Education: Reviewed 'ABCs' of diabetes management (respective goals in parentheses):  A1C (<7), blood pressure (<130/80), and cholesterol (LDL <100) 3.  Patient instructed on CHO counting diet.  Recommended 45 to 50 grams per meal and 15 to 20 grams per snack.  She is taught to read labels and about recommended serving sizes.  4.  Recommended increase exercise as  able - goal is 150 minutes per week but start with 10 minutes per day and increase as able. 5. Patient given glucometer (One Touch Verio Flex)and taught to use.  Patient to check QD.  Goals for BG discussed.  RX for testing supplies given. 4. Follow up: 2 months

## 2015-09-17 DIAGNOSIS — R110 Nausea: Secondary | ICD-10-CM | POA: Diagnosis not present

## 2015-09-17 DIAGNOSIS — G3184 Mild cognitive impairment, so stated: Secondary | ICD-10-CM | POA: Diagnosis not present

## 2015-09-17 DIAGNOSIS — E119 Type 2 diabetes mellitus without complications: Secondary | ICD-10-CM | POA: Diagnosis not present

## 2015-09-17 DIAGNOSIS — E876 Hypokalemia: Secondary | ICD-10-CM | POA: Diagnosis not present

## 2015-09-18 DIAGNOSIS — K7460 Unspecified cirrhosis of liver: Secondary | ICD-10-CM | POA: Diagnosis not present

## 2015-09-18 DIAGNOSIS — K760 Fatty (change of) liver, not elsewhere classified: Secondary | ICD-10-CM | POA: Diagnosis not present

## 2015-09-18 NOTE — Progress Notes (Signed)
Patient aware.

## 2015-09-19 DIAGNOSIS — M5030 Other cervical disc degeneration, unspecified cervical region: Secondary | ICD-10-CM | POA: Diagnosis not present

## 2015-09-19 DIAGNOSIS — M4722 Other spondylosis with radiculopathy, cervical region: Secondary | ICD-10-CM | POA: Diagnosis not present

## 2015-09-19 DIAGNOSIS — Z981 Arthrodesis status: Secondary | ICD-10-CM | POA: Diagnosis not present

## 2015-09-22 ENCOUNTER — Ambulatory Visit (INDEPENDENT_AMBULATORY_CARE_PROVIDER_SITE_OTHER): Payer: Medicare Other | Admitting: Family

## 2015-09-22 ENCOUNTER — Encounter (INDEPENDENT_AMBULATORY_CARE_PROVIDER_SITE_OTHER): Payer: Self-pay

## 2015-09-22 ENCOUNTER — Encounter: Payer: Self-pay | Admitting: Family

## 2015-09-22 VITALS — BP 156/91 | HR 99 | Temp 96.8°F | Ht 61.0 in | Wt 200.0 lb

## 2015-09-22 DIAGNOSIS — R112 Nausea with vomiting, unspecified: Secondary | ICD-10-CM | POA: Diagnosis not present

## 2015-09-22 DIAGNOSIS — Z1211 Encounter for screening for malignant neoplasm of colon: Secondary | ICD-10-CM

## 2015-09-22 DIAGNOSIS — E1165 Type 2 diabetes mellitus with hyperglycemia: Secondary | ICD-10-CM

## 2015-09-22 DIAGNOSIS — I10 Essential (primary) hypertension: Secondary | ICD-10-CM

## 2015-09-22 DIAGNOSIS — J209 Acute bronchitis, unspecified: Secondary | ICD-10-CM

## 2015-09-22 MED ORDER — PREDNISONE 10 MG (21) PO TBPK: 10.0000 mg | ORAL_TABLET | Freq: Every day | ORAL | Status: DC

## 2015-09-22 MED ORDER — ONDANSETRON HCL 8 MG PO TABS: 8.0000 mg | ORAL_TABLET | Freq: Three times a day (TID) | ORAL | Status: DC | PRN

## 2015-09-22 MED ORDER — FLUTICASONE PROPIONATE 50 MCG/ACT NA SUSP: 2.0000 | Freq: Every day | NASAL | Status: DC

## 2015-09-22 NOTE — Addendum Note (Signed)
Addended by: Shelbie Ammons on: 09/22/2015 05:01 PM   Modules accepted: Orders, SmartSet

## 2015-09-22 NOTE — Progress Notes (Signed)
Subjective:    Patient ID: Susan Patel, female    DOB: [birth year], 75 y.o.   MRN: [id number]  PT's presents to the office today to recheck HTN. PT's BP was not at goal. Hypertension This is a chronic problem. The current episode started more than 1 year ago. The problem has been waxing and waning since onset. The problem is uncontrolled. Associated symptoms include headaches and peripheral edema. Pertinent negatives include no anxiety, blurred vision, palpitations or shortness of breath. Past treatments include angiotensin blockers and diuretics. There is no history of kidney disease, CAD/MI, CVA, heart failure or a thyroid problem.  Cough This is a new problem. The current episode started more than 1 month ago. The problem has been waxing and waning. The problem occurs every few minutes. The cough is non-productive. Associated symptoms include ear pain, headaches and nasal congestion. Pertinent negatives include no chills, ear congestion, fever, postnasal drip or shortness of breath. The symptoms are aggravated by lying down. She has tried OTC cough suppressant and rest for the symptoms. The treatment provided mild relief. Her past medical history is significant for asthma. There is no history of COPD.  Diabetes She presents for her follow-up diabetic visit. Hypoglycemia symptoms include headaches. Associated symptoms include foot paresthesias. Pertinent negatives for diabetes include no blurred vision, no fatigue and no polyphagia. Symptoms are stable. Pertinent negatives for diabetic complications include no CVA. Current diabetic treatment includes oral agent (monotherapy). Her breakfast blood glucose range is generally 140-180 mg/dl.      Review of Systems  Constitutional: Negative for fever, chills and fatigue.  HENT: Positive for ear pain. Negative for postnasal drip.   Eyes: Negative for blurred vision.  Respiratory: Positive for cough. Negative for shortness of breath.     Cardiovascular: Negative for palpitations.  Endocrine: Negative for polyphagia.  Neurological: Positive for headaches.  All other systems reviewed and are negative.      Objective:   Physical Exam  Constitutional: She is oriented to person, place, and time. She appears well-developed and well-nourished. No distress.  HENT:  Head: Normocephalic and atraumatic.  Right Ear: External ear normal.  Left Ear: External ear normal.  Mouth/Throat: Oropharynx is clear and moist.  Nasal passage erythemas with mild swelling    Eyes: Pupils are equal, round, and reactive to light.  Neck: Normal range of motion. Neck supple. No thyromegaly present.  Cardiovascular: Normal rate, regular rhythm, normal heart sounds and intact distal pulses.   No murmur heard. Pulmonary/Chest: Effort normal and breath sounds normal. No respiratory distress. She has no wheezes.  Abdominal: Soft. Bowel sounds are normal. She exhibits no distension. There is no tenderness.  Musculoskeletal: Normal range of motion. She exhibits no edema or tenderness.  Neurological: She is alert and oriented to person, place, and time. She has normal reflexes. No cranial nerve deficit.  Skin: Skin is warm and dry.  Psychiatric: She has a normal mood and affect. Her behavior is normal. Judgment and thought content normal.  Vitals reviewed.   BP 156/91 mmHg  Pulse 99  Temp(Src) 96.8 F (36 C) (Oral)  Ht 5\' 1"  (1.549 m)  Wt 200 lb (90.719 kg)  BMI 37.81 kg/m2       Assessment & Plan:  1. Essential hypertension -Dash diet information given -Exercise encouraged - Stress Management  -Continue current meds -RTO in 1 months  2. Acute bronchitis, unspecified organism -- Take meds as prescribed - Use a cool mist humidifier  -Use saline nose sprays  frequently -Saline irrigations of the nose can be very helpful if done frequently.  * 4X daily for 1 week*  * Use of a nettie pot can be helpful with this. Follow directions with  this* -Force fluids -For any cough or congestion  Use plain Mucinex- regular strength or max strength is fine   * Children- consult with Pharmacist for dosing -For fever or aces or pains- take tylenol or ibuprofen appropriate for age and weight.  * for fevers greater than 101 orally you may alternate ibuprofen and tylenol every  3 hours. -Throat lozenges if help - fluticasone (FLONASE) 50 MCG/ACT nasal spray; Place 2 sprays into both nostrils daily.  Dispense: 16 g; Refill: 6 - predniSONE (STERAPRED UNI-PAK 21 TAB) 10 MG (21) TBPK tablet; Take 1 tablet (10 mg total) by mouth daily. As directed x 6 days  Dispense: 21 tablet; Refill: 0  3. Type 2 diabetes mellitus with hyperglycemia, without long-term current use of insulin ([place]) -Continue medications  -Low carb diet  Evelina Dun, FNP

## 2015-09-22 NOTE — Addendum Note (Signed)
Addended by: Liliane Bade on: 09/22/2015 05:04 PM   Modules accepted: Orders

## 2015-09-22 NOTE — Patient Instructions (Signed)
Acute Bronchitis Bronchitis is inflammation of the airways that extend from the windpipe into the lungs (bronchi). The inflammation often causes mucus to develop. This leads to a cough, which is the most common symptom of bronchitis.  In acute bronchitis, the condition usually develops suddenly and goes away over time, usually in a couple weeks. Smoking, allergies, and asthma can make bronchitis worse. Repeated episodes of bronchitis may cause further lung problems.  CAUSES Acute bronchitis is most often caused by the same virus that causes a cold. The virus can spread from person to person (contagious) through coughing, sneezing, and touching contaminated objects. SIGNS AND SYMPTOMS   Cough.   Fever.   Coughing up mucus.   Body aches.   Chest congestion.   Chills.   Shortness of breath.   Sore throat.  DIAGNOSIS  Acute bronchitis is usually diagnosed through a physical exam. Your health care provider will also ask you questions about your medical history. Tests, such as chest X-rays, are sometimes done to rule out other conditions.  TREATMENT  Acute bronchitis usually goes away in a couple weeks. Oftentimes, no medical treatment is necessary. Medicines are sometimes given for relief of fever or cough. Antibiotic medicines are usually not needed but may be prescribed in certain situations. In some cases, an inhaler may be recommended to help reduce shortness of breath and control the cough. A cool mist vaporizer may also be used to help thin bronchial secretions and make it easier to clear the chest.  HOME CARE INSTRUCTIONS  Get plenty of rest.   Drink enough fluids to keep your urine clear or pale yellow (unless you have a medical condition that requires fluid restriction). Increasing fluids may help thin your respiratory secretions (sputum) and reduce chest congestion, and it will prevent dehydration.   Take medicines only as directed by your health care provider.  If  you were prescribed an antibiotic medicine, finish it all even if you start to feel better.  Avoid smoking and secondhand smoke. Exposure to cigarette smoke or irritating chemicals will make bronchitis worse. If you are a smoker, consider using nicotine gum or skin patches to help control withdrawal symptoms. Quitting smoking will help your lungs heal faster.   Reduce the chances of another bout of acute bronchitis by washing your hands frequently, avoiding people with cold symptoms, and trying not to touch your hands to your mouth, nose, or eyes.   Keep all follow-up visits as directed by your health care provider.  SEEK MEDICAL CARE IF: Your symptoms do not improve after 1 week of treatment.  SEEK IMMEDIATE MEDICAL CARE IF:  You develop an increased fever or chills.   You have chest pain.   You have severe shortness of breath.  You have bloody sputum.   You develop dehydration.  You faint or repeatedly feel like you are going to pass out.  You develop repeated vomiting.  You develop a severe headache. MAKE SURE YOU:   Understand these instructions.  Will watch your condition.  Will get help right away if you are not doing well or get worse.   This information is not intended to replace advice given to you by your health care provider. Make sure you discuss any questions you have with your health care provider.   Document Released: 06/10/2004 Document Revised: 05/24/2014 Document Reviewed: 10/24/2012 Elsevier Interactive Patient Education 2016 [employer]  - Take meds as prescribed - Use a cool mist humidifier  -Use saline nose sprays frequently -Saline   irrigations of the nose can be very helpful if done frequently.  * 4X daily for 1 week*  * Use of a nettie pot can be helpful with this. Follow directions with this* -Force fluids -For any cough or congestion  Use plain Mucinex- regular strength or max strength is fine   * Children- consult with Pharmacist for  dosing -For fever or aces or pains- take tylenol or ibuprofen appropriate for age and weight.  * for fevers greater than 101 orally you may alternate ibuprofen and tylenol every  3 hours. -Throat lozenges if help    Addelynn Batte, FNP  

## 2015-09-24 LAB — FECAL OCCULT BLOOD, IMMUNOCHEMICAL: Fecal Occult Bld: NEGATIVE

## 2015-10-23 ENCOUNTER — Ambulatory Visit (INDEPENDENT_AMBULATORY_CARE_PROVIDER_SITE_OTHER): Payer: Medicare Other | Admitting: Family

## 2015-10-23 ENCOUNTER — Encounter: Payer: Self-pay | Admitting: Family

## 2015-10-23 VITALS — BP 168/94 | HR 98 | Temp 97.2°F | Ht 61.0 in | Wt 197.4 lb

## 2015-10-23 DIAGNOSIS — E1165 Type 2 diabetes mellitus with hyperglycemia: Secondary | ICD-10-CM

## 2015-10-23 DIAGNOSIS — Z13220 Encounter for screening for lipoid disorders: Secondary | ICD-10-CM | POA: Diagnosis not present

## 2015-10-23 DIAGNOSIS — I10 Essential (primary) hypertension: Secondary | ICD-10-CM | POA: Diagnosis not present

## 2015-10-23 DIAGNOSIS — K219 Gastro-esophageal reflux disease without esophagitis: Secondary | ICD-10-CM | POA: Diagnosis not present

## 2015-10-23 DIAGNOSIS — IMO0001 Reserved for inherently not codable concepts without codable children: Secondary | ICD-10-CM

## 2015-10-23 DIAGNOSIS — R059 Cough, unspecified: Secondary | ICD-10-CM

## 2015-10-23 DIAGNOSIS — R05 Cough: Secondary | ICD-10-CM | POA: Diagnosis not present

## 2015-10-23 DIAGNOSIS — Z136 Encounter for screening for cardiovascular disorders: Secondary | ICD-10-CM | POA: Diagnosis not present

## 2015-10-23 DIAGNOSIS — Z780 Asymptomatic menopausal state: Secondary | ICD-10-CM | POA: Diagnosis not present

## 2015-10-23 LAB — BAYER DCA HB A1C WAIVED: HB A1C (BAYER DCA - WAIVED): 7.7 % — ABNORMAL HIGH (ref <7.0)

## 2015-10-23 MED ORDER — PANTOPRAZOLE SODIUM 40 MG PO TBEC: 40.0000 mg | DELAYED_RELEASE_TABLET | Freq: Every day | ORAL | Status: DC

## 2015-10-23 MED ORDER — BENZONATATE 200 MG PO CAPS: 200.0000 mg | ORAL_CAPSULE | Freq: Three times a day (TID) | ORAL | Status: DC | PRN

## 2015-10-23 MED ORDER — HYDROCHLOROTHIAZIDE 25 MG PO TABS: 25.0000 mg | ORAL_TABLET | Freq: Every day | ORAL | Status: DC

## 2015-10-23 MED ORDER — OMEPRAZOLE 20 MG PO CPDR: 20.0000 mg | DELAYED_RELEASE_CAPSULE | Freq: Every day | ORAL | Status: DC

## 2015-10-23 NOTE — Patient Instructions (Signed)
Food Choices for Gastroesophageal Reflux Disease, Adult When you have gastroesophageal reflux disease (GERD), the foods you eat and your eating habits are very important. Choosing the right foods can help ease the discomfort of GERD. WHAT GENERAL GUIDELINES DO I NEED TO FOLLOW?  Choose fruits, vegetables, whole grains, low-fat dairy products, and low-fat meat, fish, and poultry.  Limit fats such as oils, salad dressings, butter, nuts, and avocado.  Keep a food diary to identify foods that cause symptoms.  Avoid foods that cause reflux. These may be different for different people.  Eat frequent small meals instead of three large meals each day.  Eat your meals slowly, in a relaxed setting.  Limit fried foods.  Cook foods using methods other than frying.  Avoid drinking alcohol.  Avoid drinking large amounts of liquids with your meals.  Avoid bending over or lying down until 2-3 hours after eating. WHAT FOODS ARE NOT RECOMMENDED? The following are some foods and drinks that may worsen your symptoms: Vegetables Tomatoes. Tomato juice. Tomato and spaghetti sauce. Chili peppers. Onion and garlic. Horseradish. Fruits Oranges, grapefruit, and lemon (fruit and juice). Meats High-fat meats, fish, and poultry. This includes hot dogs, ribs, ham, sausage, salami, and bacon. Dairy Whole milk and chocolate milk. Sour cream. Cream. Butter. Ice cream. Cream cheese.  Beverages Coffee and tea, with or without caffeine. Carbonated beverages or energy drinks. Condiments Hot sauce. Barbecue sauce.  Sweets/Desserts Chocolate and cocoa. Donuts. Peppermint and spearmint. Fats and Oils High-fat foods, including French fries and potato chips. Other Vinegar. Strong spices, such as black pepper, white pepper, red pepper, cayenne, curry powder, cloves, ginger, and chili powder. The items listed above may not be a complete list of foods and beverages to avoid. Contact your dietitian for more  information.   This information is not intended to replace advice given to you by your health care provider. Make sure you discuss any questions you have with your health care provider.   Document Released: 05/03/2005 Document Revised: 05/24/2014 Document Reviewed: 03/07/2013 Elsevier Interactive Patient Education 2016 Elsevier Inc.  

## 2015-10-23 NOTE — Progress Notes (Signed)
Subjective:    Patient ID: Susan Patel, female    DOB: November 04, 1940, 75 y.o.   MRN: [id number]  PT's presents to the office today to recheck HTN. PT's BP was not at goal. Hypertension This is a chronic problem. The current episode started more than 1 year ago. The problem has been waxing and waning since onset. The problem is uncontrolled. Associated symptoms include headaches and peripheral edema. Pertinent negatives include no anxiety, blurred vision, palpitations or shortness of breath. Past treatments include angiotensin blockers and diuretics. There is no history of kidney disease, CAD/MI, CVA, heart failure or a thyroid problem.  Cough This is a new problem. The current episode started more than 1 month ago. The problem has been waxing and waning. The problem occurs every few minutes. The cough is non-productive. Associated symptoms include ear pain, headaches and nasal congestion. Pertinent negatives include no chills, ear congestion, fever, postnasal drip or shortness of breath. The symptoms are aggravated by lying down. She has tried OTC cough suppressant and rest for the symptoms. The treatment provided mild relief. Her past medical history is significant for asthma. There is no history of COPD.  Diabetes She presents for her follow-up diabetic visit. Her disease course has been fluctuating. Hypoglycemia symptoms include headaches. Associated symptoms include foot paresthesias and visual change. Pertinent negatives for diabetes include no blurred vision, no fatigue and no polyphagia. Symptoms are stable. Pertinent negatives for diabetic complications include no CVA. Current diabetic treatment includes oral agent (monotherapy). Her breakfast blood glucose range is generally 140-180 mg/dl. Eye exam is current.      Review of Systems  Constitutional: Negative for fever, chills and fatigue.  HENT: Positive for ear pain. Negative for postnasal drip.   Eyes: Negative for blurred vision.    Respiratory: Positive for cough. Negative for shortness of breath.   Cardiovascular: Negative for palpitations.  Endocrine: Negative for polyphagia.  Neurological: Positive for headaches.  All other systems reviewed and are negative.      Objective:   Physical Exam  Constitutional: She is oriented to person, place, and time. She appears well-developed and well-nourished. No distress.  HENT:  Head: Normocephalic and atraumatic.  Right Ear: External ear normal.  Left Ear: External ear normal.  Mouth/Throat: Oropharynx is clear and moist.      Eyes: Pupils are equal, round, and reactive to light.  Neck: Normal range of motion. Neck supple. No thyromegaly present.  Cardiovascular: Normal rate, regular rhythm, normal heart sounds and intact distal pulses.   No murmur heard. Pulmonary/Chest: Effort normal and breath sounds normal. No respiratory distress. She has no wheezes.  Abdominal: Soft. Bowel sounds are normal. She exhibits no distension. There is no tenderness.  Musculoskeletal: Normal range of motion. She exhibits no edema or tenderness.  Neurological: She is alert and oriented to person, place, and time. She has normal reflexes. No cranial nerve deficit.  Skin: Skin is warm and dry.  Psychiatric: She has a normal mood and affect. Her behavior is normal. Judgment and thought content normal.  Vitals reviewed.   BP 168/94 mmHg  Pulse 98  Temp(Src) 97.2 F (36.2 C) (Oral)  Ht 5' 1" (1.549 m)  Wt 197 lb 6.4 oz (89.54 kg)  BMI 37.32 kg/m2       Assessment & Plan:  1. Essential hypertension -PT restarted on her HCTZ 25 mg today -Daily blood pressure log given with instructions on how to fill out and told to bring to next visit -Dash diet  information given -Exercise encouraged - Stress Management  -Continue current meds -RTO in 2 weeks - CMP14+EGFR - hydrochlorothiazide (HYDRODIURIL) 25 MG tablet; Take 1 tablet (25 mg total) by mouth daily. Reported on 09/22/2015   Dispense: 90 tablet; Refill: 1  2. Type 2 diabetes mellitus with hyperglycemia, without long-term current use of insulin ([facility]) - CMP14+EGFR - Lipid panel - Bayer DCA Hb A1c Waived  3. Postmenopausal - CMP14+EGFR - DG [facility] DEXA  4. Encounter for cholesteral screening for cardiovascular disease - CMP14+EGFR - Lipid panel  5. Cough -Started on Prilosec 20 mg today - benzonatate (TESSALON) 200 MG capsule; Take 1 capsule (200 mg total) by mouth 3 (three) times daily as needed.  Dispense: 30 capsule; Refill: 1  6. Gastroesophageal reflux disease, esophagitis presence not specified -Avoid spicy foods -Avoid eating 2-3 hours before bedtime - omeprazole (PRILOSEC) 20 MG capsule; Take 1 capsule (20 mg total) by mouth daily.  Dispense: 30 capsule; Refill: 3   Continue all meds Labs pending Health Maintenance reviewed Diet and exercise encouraged RTO 2 weeks to recheck HTN, cough, and GERD  Evelina Dun, FNP

## 2015-10-24 ENCOUNTER — Telehealth: Payer: Self-pay | Admitting: Family

## 2015-10-24 LAB — CMP14+EGFR
ALBUMIN: 3.7 g/dL (ref 3.5–4.8)
ALT: 26 IU/L (ref 0–32)
AST: 23 IU/L (ref 0–40)
Albumin/Globulin Ratio: 1.3 (ref 1.2–2.2)
Alkaline Phosphatase: 84 IU/L (ref 39–117)
BUN / CREAT RATIO: 13 (ref 12–28)
BUN: 11 mg/dL (ref 8–27)
Bilirubin Total: 0.4 mg/dL (ref 0.0–1.2)
CO2: 26 mmol/L (ref 18–29)
CREATININE: 0.87 mg/dL (ref 0.57–1.00)
Calcium: 9.6 mg/dL (ref 8.7–10.3)
Chloride: 100 mmol/L (ref 96–106)
GFR calc non Af Amer: 67 mL/min/{1.73_m2} (ref >59)
GFR, EST AFRICAN AMERICAN: 77 mL/min/{1.73_m2} (ref >59)
GLOBULIN, TOTAL: 2.8 g/dL (ref 1.5–4.5)
GLUCOSE: 115 mg/dL — AB (ref 65–99)
Potassium: 3.5 mmol/L (ref 3.5–5.2)
Sodium: 144 mmol/L (ref 134–144)
TOTAL PROTEIN: 6.5 g/dL (ref 6.0–8.5)

## 2015-10-24 LAB — LIPID PANEL
CHOL/HDL RATIO: 6.4 ratio — AB (ref 0.0–4.4)
Cholesterol, Total: 290 mg/dL — ABNORMAL HIGH (ref 100–199)
HDL: 45 mg/dL (ref >39)
LDL CALC: 208 mg/dL — AB (ref 0–99)
Triglycerides: 187 mg/dL — ABNORMAL HIGH (ref 0–149)
VLDL CHOLESTEROL CAL: 37 mg/dL (ref 5–40)

## 2015-10-24 NOTE — Telephone Encounter (Signed)
Pharmacy aware that per patients medication record in chart she is on protonix.

## 2015-10-27 ENCOUNTER — Other Ambulatory Visit: Payer: Self-pay | Admitting: Family

## 2015-10-27 MED ORDER — ROSUVASTATIN CALCIUM 40 MG PO TABS: 40.0000 mg | ORAL_TABLET | Freq: Every day | ORAL | Status: DC

## 2015-10-27 MED ORDER — DULAGLUTIDE 0.75 MG/0.5ML ~~LOC~~ SOAJ: 0.7500 mg | SUBCUTANEOUS | Status: DC

## 2015-10-29 ENCOUNTER — Other Ambulatory Visit: Payer: Self-pay

## 2015-10-29 DIAGNOSIS — J209 Acute bronchitis, unspecified: Secondary | ICD-10-CM

## 2015-10-29 MED ORDER — FLUTICASONE PROPIONATE 50 MCG/ACT NA SUSP: 2.0000 | Freq: Every day | NASAL | Status: DC

## 2015-11-02 ENCOUNTER — Other Ambulatory Visit: Payer: Self-pay | Admitting: Family

## 2015-11-03 ENCOUNTER — Other Ambulatory Visit: Payer: Self-pay | Admitting: Family

## 2015-11-06 ENCOUNTER — Ambulatory Visit (INDEPENDENT_AMBULATORY_CARE_PROVIDER_SITE_OTHER): Payer: Medicare Other | Admitting: Family

## 2015-11-06 ENCOUNTER — Encounter: Payer: Self-pay | Admitting: Family

## 2015-11-06 ENCOUNTER — Ambulatory Visit (INDEPENDENT_AMBULATORY_CARE_PROVIDER_SITE_OTHER): Payer: Medicare Other

## 2015-11-06 VITALS — BP 162/92 | HR 111 | Temp 97.2°F | Ht 61.0 in | Wt 198.2 lb

## 2015-11-06 DIAGNOSIS — S0091XA Abrasion of unspecified part of head, initial encounter: Secondary | ICD-10-CM | POA: Diagnosis not present

## 2015-11-06 DIAGNOSIS — R0781 Pleurodynia: Secondary | ICD-10-CM | POA: Diagnosis not present

## 2015-11-06 DIAGNOSIS — S20211A Contusion of right front wall of thorax, initial encounter: Secondary | ICD-10-CM

## 2015-11-06 DIAGNOSIS — R42 Dizziness and giddiness: Secondary | ICD-10-CM | POA: Diagnosis not present

## 2015-11-06 DIAGNOSIS — W1809XA Striking against other object with subsequent fall, initial encounter: Secondary | ICD-10-CM

## 2015-11-06 MED ORDER — CYCLOBENZAPRINE HCL 5 MG PO TABS
5.0000 mg | ORAL_TABLET | Freq: Three times a day (TID) | ORAL | Status: DC | PRN
Start: 2015-11-06 — End: 2015-12-17

## 2015-11-06 MED ORDER — HYDROCODONE-ACETAMINOPHEN 5-325 MG PO TABS: 1.0000 | ORAL_TABLET | Freq: Two times a day (BID) | ORAL | Status: DC | PRN

## 2015-11-06 NOTE — Patient Instructions (Signed)
Rib Contusion A rib contusion is a deep bruise on your rib area. Contusions are the result of a blunt trauma that causes bleeding and injury to the tissues under the skin. A rib contusion may involve bruising of the ribs and of the skin and muscles in the area. The skin overlying the contusion may turn blue, purple, or yellow. Minor injuries will give you a painless contusion, but more severe contusions may stay painful and swollen for a few weeks. CAUSES  A contusion is usually caused by a blow, trauma, or direct force to an area of the body. This often occurs while playing contact sports. SYMPTOMS  Swelling and redness of the injured area.  Discoloration of the injured area.  Tenderness and soreness of the injured area.  Pain with or without movement. DIAGNOSIS  The diagnosis can be made by taking a medical history and performing a physical exam. An X-ray, CT scan, or MRI may be needed to determine if there were any associated injuries, such as broken bones (fractures) or internal injuries. TREATMENT  Often, the best treatment for a rib contusion is rest. Icing or applying cold compresses to the injured area may help reduce swelling and inflammation. Deep breathing exercises may be recommended to reduce the risk of partial lung collapse and pneumonia. Over-the-counter or prescription medicines may also be recommended for pain control. HOME CARE INSTRUCTIONS   Apply ice to the injured area:  Put ice in a plastic bag.  Place a towel between your skin and the bag.  Leave the ice on for 20 minutes, 2-3 times per day.  Take medicines only as directed by your health care provider.  Rest the injured area. Avoid strenuous activity and any activities or movements that cause pain. Be careful during activities and avoid bumping the injured area.  Perform deep-breathing exercises as directed by your health care provider.  Do not lift anything that is heavier than 5 lb (2.3 kg) until your  health care provider approves.  Do not use any tobacco products, including cigarettes, chewing tobacco, or electronic cigarettes. If you need help quitting, ask your health care provider. SEEK MEDICAL CARE IF:   You have increased bruising or swelling.  You have pain that is not controlled with treatment.  You have a fever. SEEK IMMEDIATE MEDICAL CARE IF:   You have difficulty breathing or shortness of breath.  You develop a continual cough, or you cough up thick or bloody sputum.  You feel sick to your stomach (nauseous), you throw up (vomit), or you have abdominal pain.   This information is not intended to replace advice given to you by your health care provider. Make sure you discuss any questions you have with your health care provider.   Document Released: 01/26/2001 Document Revised: 05/24/2014 Document Reviewed: 02/12/2014 Elsevier Interactive Patient Education 2016 [employer]  

## 2015-11-06 NOTE — Progress Notes (Signed)
Subjective:    Patient ID: Susan Patel, female    DOB: 03-Feb-1941, 75 y.o.   MRN: [id number]   Fall The accident occurred 2 days ago. The fall occurred from a bed. She fell from a height of 3 to 5 ft. Impact surface: hit nightstand. The point of impact was the head (right rib). The pain is present in the head, back and right upper arm (right rib). The pain is at a severity of 9/10. The pain is moderate. The symptoms are aggravated by movement. Pertinent negatives include no bowel incontinence, fever, headaches, hearing loss, hematuria, loss of consciousness, nausea, numbness, tingling, visual change or vomiting. She has tried acetaminophen and rest for the symptoms. The treatment provided mild relief.      Review of Systems  Constitutional: Negative for fever.  Gastrointestinal: Negative for nausea, vomiting and bowel incontinence.  Genitourinary: Negative for hematuria.  Neurological: Negative for tingling, loss of consciousness, numbness and headaches.       Objective:   Physical Exam  Constitutional: She is oriented to person, place, and time. She appears well-developed and well-nourished. No distress.  HENT:  Head: Normocephalic and atraumatic.  Right Ear: External ear normal.  Left Ear: External ear normal.  Mouth/Throat: Oropharynx is clear and moist.  Eyes: Pupils are equal, round, and reactive to light.  Neck: Normal range of motion. Neck supple. No thyromegaly present.  Cardiovascular: Normal rate, regular rhythm, normal heart sounds and intact distal pulses.   No murmur heard. Pulmonary/Chest: Effort normal and breath sounds normal. No respiratory distress. She has no wheezes.  Abdominal: Soft. Bowel sounds are normal. She exhibits no distension. There is no tenderness.  Musculoskeletal: Normal range of motion. She exhibits no edema or tenderness.  Neurological: She is alert and oriented to person, place, and time. She has normal reflexes. No cranial nerve deficit.    Skin: Skin is warm and dry.  Abrasion present on right head, dried blood present, ecchymosis present on right ribs and right hip  Psychiatric: She has a normal mood and affect. Her behavior is normal. Judgment and thought content normal.  Vitals reviewed.     BP 162/92 mmHg  Pulse 111  Temp(Src) 97.2 F (36.2 C) (Oral)  Ht '5\' 1"'$  (1.549 m)  Wt 198 lb 3.2 oz (89.903 kg)  BMI 37.47 kg/m2     Assessment & Plan:  1. Fall against object, initial encounter - DG Ribs Unilateral W/Chest Right; Future - CBC with Differential/Platelet - CMP14+EGFR  2. Contusion of rib on right side, initial encounter discussed importance of coughing and deep breathing - HYDROcodone-acetaminophen (NORCO/VICODIN) 5-325 MG tablet; Take 1 tablet by mouth every 12 (twelve) hours as needed for moderate pain.  Dispense: 60 tablet; Refill: 0 - CMP14+EGFR  3. Abrasion of head, initial encounter - HYDROcodone-acetaminophen (NORCO/VICODIN) 5-325 MG tablet; Take 1 tablet by mouth every 12 (twelve) hours as needed for moderate pain.  Dispense: 60 tablet; Refill: 0 - CMP14+EGFR  4. Dizziness - CBC with Differential/Platelet - CMP14+EGFR   PT requesting flexeril and Norco rx today, both decreased today related to falls. Flexeril decreased to 5 mg from 10 mg and Noroc decreased to 5/'325mg'$  from 10/325 mg Discussed ordering CT of head, pt and husband states they can not get this done today because they are leaving out of town today. Discussed in length for pt to go to ED if she has any changes in speech, gait, vision, or cognitive impairment. RTO in 2 weeks to follow up  HTN  Evelina Dun, FNP

## 2015-11-07 LAB — CMP14+EGFR
A/G RATIO: 1.5 (ref 1.2–2.2)
ALT: 22 IU/L (ref 0–32)
AST: 24 IU/L (ref 0–40)
Albumin: 4.1 g/dL (ref 3.5–4.8)
Alkaline Phosphatase: 92 IU/L (ref 39–117)
BILIRUBIN TOTAL: 0.4 mg/dL (ref 0.0–1.2)
BUN/Creatinine Ratio: 16 (ref 12–28)
BUN: 15 mg/dL (ref 8–27)
CHLORIDE: 99 mmol/L (ref 96–106)
CO2: 22 mmol/L (ref 18–29)
Calcium: 9.7 mg/dL (ref 8.7–10.3)
Creatinine, Ser: 0.95 mg/dL (ref 0.57–1.00)
GFR calc non Af Amer: 60 mL/min/{1.73_m2} (ref >59)
GFR, EST AFRICAN AMERICAN: 69 mL/min/{1.73_m2} (ref >59)
GLOBULIN, TOTAL: 2.8 g/dL (ref 1.5–4.5)
Glucose: 111 mg/dL — ABNORMAL HIGH (ref 65–99)
POTASSIUM: 4.8 mmol/L (ref 3.5–5.2)
SODIUM: 139 mmol/L (ref 134–144)
TOTAL PROTEIN: 6.9 g/dL (ref 6.0–8.5)

## 2015-11-07 LAB — CBC WITH DIFFERENTIAL/PLATELET
BASOS: 1 %
Basophils Absolute: 0.1 10*3/uL (ref 0.0–0.2)
EOS (ABSOLUTE): 0.1 10*3/uL (ref 0.0–0.4)
Eos: 1 %
HEMATOCRIT: 37.7 % (ref 34.0–46.6)
Hemoglobin: 12.4 g/dL (ref 11.1–15.9)
Immature Grans (Abs): 0 10*3/uL (ref 0.0–0.1)
Immature Granulocytes: 0 %
LYMPHS: 25 %
Lymphocytes Absolute: 2.2 10*3/uL (ref 0.7–3.1)
MCH: 27.8 pg (ref 26.6–33.0)
MCHC: 32.9 g/dL (ref 31.5–35.7)
MCV: 85 fL (ref 79–97)
MONOCYTES: 11 %
Monocytes Absolute: 1 10*3/uL — ABNORMAL HIGH (ref 0.1–0.9)
NEUTROS ABS: 5.5 10*3/uL (ref 1.4–7.0)
NEUTROS PCT: 62 %
PLATELETS: 480 10*3/uL — AB (ref 150–379)
RBC: 4.46 x10E6/uL (ref 3.77–5.28)
RDW: 14.7 % (ref 12.3–15.4)
WBC: 8.8 10*3/uL (ref 3.4–10.8)

## 2015-11-19 DIAGNOSIS — M170 Bilateral primary osteoarthritis of knee: Secondary | ICD-10-CM | POA: Insufficient documentation

## 2015-11-27 ENCOUNTER — Telehealth: Payer: Self-pay | Admitting: Family

## 2015-11-27 ENCOUNTER — Other Ambulatory Visit: Payer: Self-pay | Admitting: Family

## 2015-11-27 NOTE — Telephone Encounter (Signed)
Patient will call pharmacy for refills.

## 2015-11-28 NOTE — Telephone Encounter (Signed)
Last seen 11/06/15  [facility]

## 2015-12-17 ENCOUNTER — Ambulatory Visit (INDEPENDENT_AMBULATORY_CARE_PROVIDER_SITE_OTHER): Payer: Medicare Other | Admitting: Pediatrics

## 2015-12-17 ENCOUNTER — Encounter: Payer: Self-pay | Admitting: Pediatrics

## 2015-12-17 VITALS — BP 161/96 | HR 75 | Temp 96.8°F | Ht 61.0 in | Wt 190.2 lb

## 2015-12-17 DIAGNOSIS — K2100 Gastro-esophageal reflux disease with esophagitis, without bleeding: Secondary | ICD-10-CM

## 2015-12-17 DIAGNOSIS — J309 Allergic rhinitis, unspecified: Secondary | ICD-10-CM

## 2015-12-17 DIAGNOSIS — I10 Essential (primary) hypertension: Secondary | ICD-10-CM

## 2015-12-17 DIAGNOSIS — J4540 Moderate persistent asthma, uncomplicated: Secondary | ICD-10-CM

## 2015-12-17 DIAGNOSIS — R05 Cough: Secondary | ICD-10-CM | POA: Diagnosis not present

## 2015-12-17 DIAGNOSIS — R059 Cough, unspecified: Secondary | ICD-10-CM

## 2015-12-17 MED ORDER — CYCLOBENZAPRINE HCL 5 MG PO TABS: 5.0000 mg | ORAL_TABLET | Freq: Three times a day (TID) | ORAL | 0 refills | Status: DC | PRN

## 2015-12-17 NOTE — Progress Notes (Signed)
    Subjective:    Patient ID: Susan Patel, female    DOB: 10-22-1940, 75 y.o.   MRN: [id number]  CC: Bronchitis   HPI: Susan Patel is a 75 y.o. female presenting for Bronchitis  Started coughing 3-4 months ago after neck surgery in March 2017 Coughs most nights but not all Does fine during the day On oxygen at night Had a sleep study 2010 per pulm notes with no significant sleep apnea H/o asthma, followed by pulm at [facility] Last seen 06/2015 by them On breo, singulair Rarely using albuterol On nexium for reflux Not taking flonase regularly Does have a h/o allergies   Colonoscopy: about 7 years ago   Depression screen [facility] 2/9 12/17/2015 10/23/2015 08/21/2015  Decreased Interest 0 0 0  Down, Depressed, Hopeless 0 0 0  PHQ - 2 Score 0 0 0     Relevant past medical, surgical, family and social history reviewed and updated. Interim medical history since our last visit reviewed. Allergies and medications reviewed and updated.  History  Smoking Status  . Never Smoker  Smokeless Tobacco  . Never Used    ROS: Per HPI      Objective:    BP (!) 161/96   Pulse 75   Temp (!) 96.8 F (36 C) (Oral)   Ht 5\' 1"  (1.549 m)   Wt 190 lb 3.2 oz (86.3 kg)   BMI 35.94 kg/m   Wt Readings from Last 3 Encounters:  12/17/15 190 lb 3.2 oz (86.3 kg)  11/06/15 198 lb 3.2 oz (89.9 kg)  10/23/15 197 lb 6.4 oz (89.5 kg)     Gen: NAD, alert, cooperative with exam, NCAT EYES: EOMI, no scleral injection or icterus ENT:  TMs pearly gray b/l, OP without erythema LYMPH: no cervical LAD CV: NRRR, normal S1/S2, no murmur, distal pulses 2+ b/l Resp: CTABL, no wheezes, normal WOB Abd: +BS, soft, NTND. no guarding or organomegaly Ext: No edema, warm Neuro: Alert, strength equal b/l UE and LE, coordination grossly normal MSK: normal muscle bulk Skin: 62mm pedunculated papule 5th L toe, no redness, mild tenderness     Assessment & Plan:    [name] was seen today for cough and  f/u.  Diagnoses and all orders for this visit:  Cough Ongoing, mostly at night Takes nexium during day Try taking at night Take flonase daily Let me know if stomach symptoms worsen Also possibly related to asthma, pt tries to avoid albuterol due to worsening her tremors, not needed recently  Gastro-esophageal reflux disease with esophagitis Cont PPI Will get colonoscopy/endoscopy information from [facility], not easily identifiable in Care Everywhere  Essential hypertension Elevated today Was on HCTZ in addition to other meds, stopped a couple months ago Check BPs at home and bring back to clinic in two weeks  Asthma, moderate persistent, uncomplicated Cont breo, singulair, prn albuterol On oxygen at night for dyspnea  Allergic rhinitis, unspecified allergic rhinitis type flonase as above, restart Cont antihistamine  Other orders -     cyclobenzaprine (FLEXERIL) 5 MG tablet; Take 1 tablet (5 mg total) by mouth 3 (three) times daily as needed for muscle spasms.  Follow up plan: Return in about 2 weeks (around 12/31/2015) for CPE, 30 minute exam.  Assunta Found, MD [facility] 12/17/2015, 4:17 PM

## 2015-12-17 NOTE — Patient Instructions (Signed)
Flonase every day two sprays Take stomach medicine for reflux (either nexium or protonix, not both) before bed Let me know if your reflux symptoms get worse switching the timing  Check your blood pressure at home every day. Bring numbers to next clinic visit.

## 2015-12-28 ENCOUNTER — Other Ambulatory Visit: Payer: Self-pay | Admitting: Family

## 2016-01-01 ENCOUNTER — Encounter: Payer: Self-pay | Admitting: Pediatrics

## 2016-01-01 ENCOUNTER — Ambulatory Visit (INDEPENDENT_AMBULATORY_CARE_PROVIDER_SITE_OTHER): Payer: Medicare Other | Admitting: Pediatrics

## 2016-01-01 VITALS — BP 151/80 | HR 96 | Temp 97.1°F | Ht 61.0 in | Wt 190.6 lb

## 2016-01-01 DIAGNOSIS — J4540 Moderate persistent asthma, uncomplicated: Secondary | ICD-10-CM | POA: Diagnosis not present

## 2016-01-01 DIAGNOSIS — R05 Cough: Secondary | ICD-10-CM

## 2016-01-01 DIAGNOSIS — D485 Neoplasm of uncertain behavior of skin: Secondary | ICD-10-CM

## 2016-01-01 DIAGNOSIS — D2122 Benign neoplasm of connective and other soft tissue of left lower limb, including hip: Secondary | ICD-10-CM | POA: Diagnosis not present

## 2016-01-01 DIAGNOSIS — R058 Other specified cough: Secondary | ICD-10-CM

## 2016-01-01 DIAGNOSIS — I10 Essential (primary) hypertension: Secondary | ICD-10-CM

## 2016-01-01 MED ORDER — HYDROCODONE-HOMATROPINE 5-1.5 MG/5ML PO SYRP: 2.5000 mL | ORAL_SOLUTION | Freq: Three times a day (TID) | ORAL | 0 refills | Status: DC | PRN

## 2016-01-01 MED ORDER — ALBUTEROL SULFATE HFA 108 (90 BASE) MCG/ACT IN AERS: 1.0000 | INHALATION_SPRAY | Freq: Four times a day (QID) | RESPIRATORY_TRACT | 1 refills | Status: DC | PRN

## 2016-01-01 NOTE — Progress Notes (Signed)
    Subjective:    Patient ID: Susan Patel, female    DOB: [birth year], 75 y.o.   MRN: [id number]  CC: Wart removal   HPI: Susan Patel is a 75 y.o. female presenting for Wart removal  Continues to have cough, primarily at night and in the morning when she wakes up No fevers Not using albuterol Takes Breo regularly Going to [place] in a few days for wedding anniversary Wart/skin tag on 5th L toe continues to bother her Especially when she wears closed toe shoes She wants it removed No headaches, no SOB No CP   Relevant past medical, surgical, family and social history reviewed. Interim medical history since our last visit reviewed. Allergies and medications reviewed and updated.  History  Smoking Status  . Never Smoker  Smokeless Tobacco  . Never Used    ROS: Per HPI      Objective:    BP (!) 151/80   Pulse 96   Temp 97.1 F (36.2 C) (Oral)   Ht 5\' 1"  (1.549 m)   Wt 190 lb 9.6 oz (86.5 kg)   BMI 36.01 kg/m   Wt Readings from Last 3 Encounters:  01/01/16 190 lb 9.6 oz (86.5 kg)  12/17/15 190 lb 3.2 oz (86.3 kg)  11/06/15 198 lb 3.2 oz (89.9 kg)    Gen: NAD, alert, cooperative with exam, NCAT EYES: EOMI, no conjunctival injection, or no icterus ENT:  TMs pearly gray b/l, OP without erythema LYMPH: no cervical LAD CV: NRRR, normal S1/S2 Resp: CTABL, no wheezes, normal WOB Ext: No edema, warm Neuro: Alert and oriented, strength equal b/l UE and LE, coordination grossly normal MSK: normal muscle bulk Skin: L 5th toe with pedunculated, slighlty hardened skin tag     Assessment & Plan:  Bridney was seen today for skin lesion removal and follow up med problems.  Diagnoses and all orders for this visit:  Post-viral cough syndrome Lung exam clear Improving Tessalon pearles didn't help Discussed using below cough syrup only when not driving Try albuterol for cough  -     HYDROcodone-homatropine (HYCODAN) 5-1.5 MG/5ML syrup; Take 2.5-5 mLs by mouth every  8 (eight) hours as needed for cough.  Neoplasm of uncertain behavior of skin Procedure note below Discussed wound care -     Pathology  Asthma, moderate persistent, uncomplicated Cont breo Use albuterol as needed -     albuterol (PROVENTIL HFA;VENTOLIN HFA) 108 (90 Base) MCG/ACT inhaler; Inhale 1-2 puffs into the lungs every 6 (six) hours as needed for wheezing or shortness of breath.  Essential hypertension Remains slightly elevated RTC after trip Bring BP log  PROCEDURE: After risks and benefits and procedure itself discussed in detail, pt agreed to proceed with biopsy of skin lesion 5th L toe. Area was thoroughly cleaned and prepped with betadine. 55mL of 1% lidocaine was injected under the the lesion. Using a dermablade, the lesion was removed, hemostasis achieved with silver nitrate stick x 1. Wound dressed, wound care discussed with pt.   Follow up plan: Return in about 3 months (around 04/02/2016).  Assunta Found, MD [facility] 01/01/2016, 4:44 PM

## 2016-01-04 ENCOUNTER — Other Ambulatory Visit: Payer: Self-pay | Admitting: Pediatrics

## 2016-01-05 NOTE — Telephone Encounter (Signed)
Seen [place] in June

## 2016-01-06 LAB — PATHOLOGY

## 2016-01-27 DIAGNOSIS — M5030 Other cervical disc degeneration, unspecified cervical region: Secondary | ICD-10-CM | POA: Diagnosis not present

## 2016-01-27 DIAGNOSIS — I10 Essential (primary) hypertension: Secondary | ICD-10-CM | POA: Diagnosis not present

## 2016-01-27 DIAGNOSIS — M4722 Other spondylosis with radiculopathy, cervical region: Secondary | ICD-10-CM | POA: Diagnosis not present

## 2016-01-27 DIAGNOSIS — Z981 Arthrodesis status: Secondary | ICD-10-CM | POA: Diagnosis not present

## 2016-01-27 DIAGNOSIS — Z6835 Body mass index (BMI) 35.0-35.9, adult: Secondary | ICD-10-CM | POA: Diagnosis not present

## 2016-01-29 DIAGNOSIS — L304 Erythema intertrigo: Secondary | ICD-10-CM | POA: Diagnosis not present

## 2016-01-29 DIAGNOSIS — D485 Neoplasm of uncertain behavior of skin: Secondary | ICD-10-CM | POA: Diagnosis not present

## 2016-02-10 DIAGNOSIS — G43109 Migraine with aura, not intractable, without status migrainosus: Secondary | ICD-10-CM | POA: Diagnosis not present

## 2016-02-13 ENCOUNTER — Other Ambulatory Visit: Payer: Self-pay

## 2016-02-13 MED ORDER — PANTOPRAZOLE SODIUM 40 MG PO TBEC: 40.0000 mg | DELAYED_RELEASE_TABLET | Freq: Every day | ORAL | 0 refills | Status: DC

## 2016-02-17 DIAGNOSIS — H35373 Puckering of macula, bilateral: Secondary | ICD-10-CM | POA: Diagnosis not present

## 2016-02-17 DIAGNOSIS — H53123 Transient visual loss, bilateral: Secondary | ICD-10-CM | POA: Diagnosis not present

## 2016-02-17 DIAGNOSIS — G43109 Migraine with aura, not intractable, without status migrainosus: Secondary | ICD-10-CM | POA: Diagnosis not present

## 2016-03-06 ENCOUNTER — Other Ambulatory Visit: Payer: Self-pay | Admitting: Family

## 2016-03-11 DIAGNOSIS — M316 Other giant cell arteritis: Secondary | ICD-10-CM | POA: Diagnosis not present

## 2016-03-11 DIAGNOSIS — H3582 Retinal ischemia: Secondary | ICD-10-CM | POA: Diagnosis not present

## 2016-03-11 DIAGNOSIS — H43812 Vitreous degeneration, left eye: Secondary | ICD-10-CM | POA: Diagnosis not present

## 2016-03-11 DIAGNOSIS — H35373 Puckering of macula, bilateral: Secondary | ICD-10-CM | POA: Diagnosis not present

## 2016-03-17 DIAGNOSIS — H539 Unspecified visual disturbance: Secondary | ICD-10-CM | POA: Diagnosis not present

## 2016-03-19 DIAGNOSIS — Z79891 Long term (current) use of opiate analgesic: Secondary | ICD-10-CM | POA: Diagnosis not present

## 2016-03-19 DIAGNOSIS — Z881 Allergy status to other antibiotic agents status: Secondary | ICD-10-CM | POA: Diagnosis not present

## 2016-03-19 DIAGNOSIS — I10 Essential (primary) hypertension: Secondary | ICD-10-CM | POA: Diagnosis not present

## 2016-03-19 DIAGNOSIS — Z961 Presence of intraocular lens: Secondary | ICD-10-CM | POA: Diagnosis not present

## 2016-03-19 DIAGNOSIS — E559 Vitamin D deficiency, unspecified: Secondary | ICD-10-CM | POA: Diagnosis not present

## 2016-03-19 DIAGNOSIS — Z888 Allergy status to other drugs, medicaments and biological substances status: Secondary | ICD-10-CM | POA: Diagnosis not present

## 2016-03-19 DIAGNOSIS — Z79899 Other long term (current) drug therapy: Secondary | ICD-10-CM | POA: Diagnosis not present

## 2016-03-19 DIAGNOSIS — Z79890 Hormone replacement therapy (postmenopausal): Secondary | ICD-10-CM | POA: Diagnosis not present

## 2016-03-19 DIAGNOSIS — K219 Gastro-esophageal reflux disease without esophagitis: Secondary | ICD-10-CM | POA: Diagnosis not present

## 2016-03-19 DIAGNOSIS — Z882 Allergy status to sulfonamides status: Secondary | ICD-10-CM | POA: Diagnosis not present

## 2016-03-19 DIAGNOSIS — Z8673 Personal history of transient ischemic attack (TIA), and cerebral infarction without residual deficits: Secondary | ICD-10-CM | POA: Diagnosis not present

## 2016-03-19 DIAGNOSIS — J45909 Unspecified asthma, uncomplicated: Secondary | ICD-10-CM | POA: Diagnosis not present

## 2016-03-19 DIAGNOSIS — Z91048 Other nonmedicinal substance allergy status: Secondary | ICD-10-CM | POA: Diagnosis not present

## 2016-03-19 DIAGNOSIS — E119 Type 2 diabetes mellitus without complications: Secondary | ICD-10-CM | POA: Diagnosis not present

## 2016-03-19 DIAGNOSIS — M797 Fibromyalgia: Secondary | ICD-10-CM | POA: Diagnosis not present

## 2016-03-19 DIAGNOSIS — M170 Bilateral primary osteoarthritis of knee: Secondary | ICD-10-CM | POA: Diagnosis not present

## 2016-03-19 DIAGNOSIS — Z9842 Cataract extraction status, left eye: Secondary | ICD-10-CM | POA: Diagnosis not present

## 2016-03-19 DIAGNOSIS — H538 Other visual disturbances: Secondary | ICD-10-CM | POA: Diagnosis not present

## 2016-03-19 DIAGNOSIS — G894 Chronic pain syndrome: Secondary | ICD-10-CM | POA: Diagnosis not present

## 2016-03-19 DIAGNOSIS — Z9841 Cataract extraction status, right eye: Secondary | ICD-10-CM | POA: Diagnosis not present

## 2016-03-19 DIAGNOSIS — H539 Unspecified visual disturbance: Secondary | ICD-10-CM | POA: Diagnosis not present

## 2016-03-19 DIAGNOSIS — Z7984 Long term (current) use of oral hypoglycemic drugs: Secondary | ICD-10-CM | POA: Diagnosis not present

## 2016-03-20 ENCOUNTER — Encounter (HOSPITAL_COMMUNITY): Payer: Self-pay

## 2016-03-20 ENCOUNTER — Emergency Department (HOSPITAL_COMMUNITY): Payer: Medicare Other

## 2016-03-20 ENCOUNTER — Inpatient Hospital Stay (HOSPITAL_COMMUNITY)
Admission: EM | Admit: 2016-03-20 | Discharge: 2016-03-23 | DRG: 065 | Disposition: A | Discharge status: Rehabilitation Facility | Payer: Medicare Other | Attending: Internal Medicine | Admitting: Internal Medicine

## 2016-03-20 DIAGNOSIS — Z23 Encounter for immunization: Secondary | ICD-10-CM | POA: Diagnosis not present

## 2016-03-20 DIAGNOSIS — E669 Obesity, unspecified: Secondary | ICD-10-CM | POA: Diagnosis present

## 2016-03-20 DIAGNOSIS — Z9981 Dependence on supplemental oxygen: Secondary | ICD-10-CM

## 2016-03-20 DIAGNOSIS — R29810 Facial weakness: Secondary | ICD-10-CM | POA: Diagnosis not present

## 2016-03-20 DIAGNOSIS — I69322 Dysarthria following cerebral infarction: Secondary | ICD-10-CM

## 2016-03-20 DIAGNOSIS — J449 Chronic obstructive pulmonary disease, unspecified: Secondary | ICD-10-CM | POA: Diagnosis present

## 2016-03-20 DIAGNOSIS — Z8249 Family history of ischemic heart disease and other diseases of the circulatory system: Secondary | ICD-10-CM | POA: Diagnosis not present

## 2016-03-20 DIAGNOSIS — I69351 Hemiplegia and hemiparesis following cerebral infarction affecting right dominant side: Secondary | ICD-10-CM

## 2016-03-20 DIAGNOSIS — M797 Fibromyalgia: Secondary | ICD-10-CM

## 2016-03-20 DIAGNOSIS — Z888 Allergy status to other drugs, medicaments and biological substances status: Secondary | ICD-10-CM | POA: Diagnosis not present

## 2016-03-20 DIAGNOSIS — Z7984 Long term (current) use of oral hypoglycemic drugs: Secondary | ICD-10-CM

## 2016-03-20 DIAGNOSIS — I69391 Dysphagia following cerebral infarction: Secondary | ICD-10-CM

## 2016-03-20 DIAGNOSIS — R4781 Slurred speech: Secondary | ICD-10-CM | POA: Diagnosis not present

## 2016-03-20 DIAGNOSIS — R1313 Dysphagia, pharyngeal phase: Secondary | ICD-10-CM | POA: Diagnosis present

## 2016-03-20 DIAGNOSIS — I639 Cerebral infarction, unspecified: Secondary | ICD-10-CM | POA: Diagnosis not present

## 2016-03-20 DIAGNOSIS — Z7951 Long term (current) use of inhaled steroids: Secondary | ICD-10-CM

## 2016-03-20 DIAGNOSIS — E785 Hyperlipidemia, unspecified: Secondary | ICD-10-CM | POA: Diagnosis present

## 2016-03-20 DIAGNOSIS — K219 Gastro-esophageal reflux disease without esophagitis: Secondary | ICD-10-CM | POA: Diagnosis present

## 2016-03-20 DIAGNOSIS — E782 Mixed hyperlipidemia: Secondary | ICD-10-CM

## 2016-03-20 DIAGNOSIS — I69398 Other sequelae of cerebral infarction: Secondary | ICD-10-CM

## 2016-03-20 DIAGNOSIS — R2689 Other abnormalities of gait and mobility: Secondary | ICD-10-CM | POA: Diagnosis present

## 2016-03-20 DIAGNOSIS — E1142 Type 2 diabetes mellitus with diabetic polyneuropathy: Secondary | ICD-10-CM | POA: Diagnosis present

## 2016-03-20 DIAGNOSIS — Z6834 Body mass index (BMI) 34.0-34.9, adult: Secondary | ICD-10-CM | POA: Diagnosis not present

## 2016-03-20 DIAGNOSIS — Z809 Family history of malignant neoplasm, unspecified: Secondary | ICD-10-CM

## 2016-03-20 DIAGNOSIS — G20 Parkinson's disease: Secondary | ICD-10-CM | POA: Diagnosis present

## 2016-03-20 DIAGNOSIS — K5900 Constipation, unspecified: Secondary | ICD-10-CM | POA: Diagnosis present

## 2016-03-20 DIAGNOSIS — I6930 Unspecified sequelae of cerebral infarction: Secondary | ICD-10-CM | POA: Diagnosis present

## 2016-03-20 DIAGNOSIS — I10 Essential (primary) hypertension: Secondary | ICD-10-CM

## 2016-03-20 DIAGNOSIS — G8929 Other chronic pain: Secondary | ICD-10-CM | POA: Diagnosis present

## 2016-03-20 DIAGNOSIS — E46 Unspecified protein-calorie malnutrition: Secondary | ICD-10-CM | POA: Diagnosis present

## 2016-03-20 DIAGNOSIS — I6322 Cerebral infarction due to unspecified occlusion or stenosis of basilar arteries: Secondary | ICD-10-CM

## 2016-03-20 DIAGNOSIS — G894 Chronic pain syndrome: Secondary | ICD-10-CM

## 2016-03-20 DIAGNOSIS — I63311 Cerebral infarction due to thrombosis of right middle cerebral artery: Secondary | ICD-10-CM | POA: Diagnosis not present

## 2016-03-20 DIAGNOSIS — Z7982 Long term (current) use of aspirin: Secondary | ICD-10-CM

## 2016-03-20 DIAGNOSIS — D62 Acute posthemorrhagic anemia: Secondary | ICD-10-CM

## 2016-03-20 DIAGNOSIS — E1140 Type 2 diabetes mellitus with diabetic neuropathy, unspecified: Secondary | ICD-10-CM | POA: Diagnosis present

## 2016-03-20 DIAGNOSIS — Z79899 Other long term (current) drug therapy: Secondary | ICD-10-CM | POA: Diagnosis not present

## 2016-03-20 DIAGNOSIS — F329 Major depressive disorder, single episode, unspecified: Secondary | ICD-10-CM | POA: Diagnosis present

## 2016-03-20 DIAGNOSIS — Z91048 Other nonmedicinal substance allergy status: Secondary | ICD-10-CM | POA: Diagnosis not present

## 2016-03-20 DIAGNOSIS — I69392 Facial weakness following cerebral infarction: Secondary | ICD-10-CM | POA: Diagnosis not present

## 2016-03-20 DIAGNOSIS — I63312 Cerebral infarction due to thrombosis of left middle cerebral artery: Secondary | ICD-10-CM | POA: Diagnosis not present

## 2016-03-20 DIAGNOSIS — Z981 Arthrodesis status: Secondary | ICD-10-CM | POA: Diagnosis not present

## 2016-03-20 DIAGNOSIS — Z881 Allergy status to other antibiotic agents status: Secondary | ICD-10-CM | POA: Diagnosis not present

## 2016-03-20 DIAGNOSIS — N179 Acute kidney failure, unspecified: Secondary | ICD-10-CM

## 2016-03-20 DIAGNOSIS — I63412 Cerebral infarction due to embolism of left middle cerebral artery: Secondary | ICD-10-CM | POA: Diagnosis not present

## 2016-03-20 DIAGNOSIS — R269 Unspecified abnormalities of gait and mobility: Secondary | ICD-10-CM

## 2016-03-20 DIAGNOSIS — J410 Simple chronic bronchitis: Secondary | ICD-10-CM | POA: Diagnosis not present

## 2016-03-20 DIAGNOSIS — I69320 Aphasia following cerebral infarction: Secondary | ICD-10-CM | POA: Diagnosis not present

## 2016-03-20 DIAGNOSIS — I6302 Cerebral infarction due to thrombosis of basilar artery: Secondary | ICD-10-CM | POA: Diagnosis not present

## 2016-03-20 DIAGNOSIS — E876 Hypokalemia: Secondary | ICD-10-CM | POA: Diagnosis present

## 2016-03-20 DIAGNOSIS — E1165 Type 2 diabetes mellitus with hyperglycemia: Secondary | ICD-10-CM | POA: Diagnosis not present

## 2016-03-20 DIAGNOSIS — E119 Type 2 diabetes mellitus without complications: Secondary | ICD-10-CM

## 2016-03-20 DIAGNOSIS — I6789 Other cerebrovascular disease: Secondary | ICD-10-CM | POA: Diagnosis not present

## 2016-03-20 DIAGNOSIS — I6503 Occlusion and stenosis of bilateral vertebral arteries: Secondary | ICD-10-CM | POA: Diagnosis not present

## 2016-03-20 HISTORY — PX: BIOPSY EYE MUSCLE: PRO14

## 2016-03-20 LAB — CBC
HCT: 35.5 % — ABNORMAL LOW (ref 36.0–46.0)
HEMATOCRIT: 38.2 % (ref 36.0–46.0)
HEMOGLOBIN: 12.7 g/dL (ref 12.0–15.0)
Hemoglobin: 11.5 g/dL — ABNORMAL LOW (ref 12.0–15.0)
MCH: 28.3 pg (ref 26.0–34.0)
MCH: 28.3 pg (ref 26.0–34.0)
MCHC: 32.4 g/dL (ref 30.0–36.0)
MCHC: 33.2 g/dL (ref 30.0–36.0)
MCV: 87.4 fL (ref 78.0–100.0)
MCV: 85.1 fL (ref 78.0–100.0)
PLATELETS: 351 10*3/uL (ref 150–400)
Platelets: 384 10*3/uL (ref 150–400)
RBC: 4.06 MIL/uL (ref 3.87–5.11)
RBC: 4.49 MIL/uL (ref 3.87–5.11)
RDW: 14.1 % (ref 11.5–15.5)
RDW: 13.9 % (ref 11.5–15.5)
WBC: 6.4 10*3/uL (ref 4.0–10.5)
WBC: 8.1 10*3/uL (ref 4.0–10.5)

## 2016-03-20 LAB — DIFFERENTIAL
BASOS PCT: 1 %
Basophils Absolute: 0.1 10*3/uL (ref 0.0–0.1)
EOS PCT: 3 %
Eosinophils Absolute: 0.2 10*3/uL (ref 0.0–0.7)
LYMPHS PCT: 26 %
Lymphs Abs: 1.6 10*3/uL (ref 0.7–4.0)
Monocytes Absolute: 0.6 10*3/uL (ref 0.1–1.0)
Monocytes Relative: 10 %
NEUTROS ABS: 3.9 10*3/uL (ref 1.7–7.7)
NEUTROS PCT: 60 %

## 2016-03-20 LAB — PROTIME-INR
INR: 0.96
PROTHROMBIN TIME: 12.8 s (ref 11.4–15.2)

## 2016-03-20 LAB — URINALYSIS, ROUTINE W REFLEX MICROSCOPIC
BILIRUBIN URINE: NEGATIVE
Glucose, UA: NEGATIVE mg/dL
Hgb urine dipstick: NEGATIVE
KETONES UR: NEGATIVE mg/dL
NITRITE: NEGATIVE
PH: 5.5 (ref 5.0–8.0)
PROTEIN: NEGATIVE mg/dL
Specific Gravity, Urine: 1.02 (ref 1.005–1.030)

## 2016-03-20 LAB — COMPREHENSIVE METABOLIC PANEL
ALBUMIN: 3.2 g/dL — AB (ref 3.5–5.0)
ALK PHOS: 104 U/L (ref 38–126)
ALT: 15 U/L (ref 14–54)
ANION GAP: 5 (ref 5–15)
AST: 17 U/L (ref 15–41)
BUN: 17 mg/dL (ref 6–20)
CALCIUM: 8.8 mg/dL — AB (ref 8.9–10.3)
CHLORIDE: 105 mmol/L (ref 101–111)
CO2: 29 mmol/L (ref 22–32)
Creatinine, Ser: 1.06 mg/dL — ABNORMAL HIGH (ref 0.44–1.00)
GFR calc non Af Amer: 51 mL/min — ABNORMAL LOW (ref >60)
GFR, EST AFRICAN AMERICAN: 59 mL/min — AB (ref >60)
GLUCOSE: 130 mg/dL — AB (ref 65–99)
Potassium: 4.3 mmol/L (ref 3.5–5.1)
SODIUM: 139 mmol/L (ref 135–145)
Total Bilirubin: 0.4 mg/dL (ref 0.3–1.2)
Total Protein: 6.4 g/dL — ABNORMAL LOW (ref 6.5–8.1)

## 2016-03-20 LAB — ETHANOL

## 2016-03-20 LAB — RAPID URINE DRUG SCREEN, HOSP PERFORMED
AMPHETAMINES: NOT DETECTED
BARBITURATES: NOT DETECTED
Benzodiazepines: POSITIVE — AB
COCAINE: NOT DETECTED
OPIATES: POSITIVE — AB
TETRAHYDROCANNABINOL: NOT DETECTED

## 2016-03-20 LAB — APTT: aPTT: 28 seconds (ref 24–36)

## 2016-03-20 LAB — URINE MICROSCOPIC-ADD ON: RBC / HPF: NONE SEEN RBC/hpf (ref 0–5)

## 2016-03-20 LAB — TROPONIN I: Troponin I: <0.03 ng/mL (ref <0.03)

## 2016-03-20 LAB — GLUCOSE, CAPILLARY: Glucose-Capillary: 192 mg/dL — ABNORMAL HIGH (ref 65–99)

## 2016-03-20 LAB — CREATININE, SERUM: CREATININE: 0.89 mg/dL (ref 0.44–1.00)

## 2016-03-20 LAB — CBG MONITORING, ED: Glucose-Capillary: 69 mg/dL (ref 65–99)

## 2016-03-20 MED ORDER — HYDRALAZINE HCL 20 MG/ML IJ SOLN
5.0000 mg | Freq: Once | INTRAMUSCULAR | Status: AC
  Administered 2016-03-21: 5 mg via INTRAVENOUS
  Filled 2016-03-20: qty 1

## 2016-03-20 MED ORDER — ASPIRIN 300 MG RE SUPP
300.0000 mg | Freq: Once | RECTAL | Status: AC
  Administered 2016-03-20: 300 mg via RECTAL
  Filled 2016-03-20: qty 1

## 2016-03-20 MED ORDER — FLUTICASONE FUROATE-VILANTEROL 200-25 MCG/INH IN AEPB
1.0000 | INHALATION_SPRAY | Freq: Every day | RESPIRATORY_TRACT | Status: DC
  Administered 2016-03-21 – 2016-03-23 (×3): 1 via RESPIRATORY_TRACT
  Filled 2016-03-20: qty 28

## 2016-03-20 MED ORDER — PREDNISONE 10 MG PO TABS
60.0000 mg | ORAL_TABLET | Freq: Every day | ORAL | Status: DC
  Administered 2016-03-21: 60 mg via ORAL
  Filled 2016-03-20: qty 1

## 2016-03-20 MED ORDER — INSULIN ASPART 100 UNIT/ML ~~LOC~~ SOLN
0.0000 [IU] | Freq: Three times a day (TID) | SUBCUTANEOUS | Status: DC
  Administered 2016-03-21 (×3): 1 [IU] via SUBCUTANEOUS
  Administered 2016-03-22: 2 [IU] via SUBCUTANEOUS
  Administered 2016-03-23: 1 [IU] via SUBCUTANEOUS
  Filled 2016-03-20: qty 1

## 2016-03-20 MED ORDER — HYDRALAZINE HCL 20 MG/ML IJ SOLN
10.0000 mg | Freq: Four times a day (QID) | INTRAMUSCULAR | Status: DC | PRN
  Administered 2016-03-20 – 2016-03-23 (×4): 10 mg via INTRAVENOUS
  Filled 2016-03-20 (×4): qty 1

## 2016-03-20 MED ORDER — LOSARTAN POTASSIUM 50 MG PO TABS: 25.0000 mg | ORAL_TABLET | Freq: Once | ORAL | Status: DC

## 2016-03-20 MED ORDER — STROKE: EARLY STAGES OF RECOVERY BOOK
Freq: Once | Status: AC
  Administered 2016-03-20: 21:00:00
  Filled 2016-03-20: qty 1

## 2016-03-20 MED ORDER — LATANOPROST 0.005 % OP SOLN
1.0000 [drp] | Freq: Every day | OPHTHALMIC | Status: DC
  Administered 2016-03-21 – 2016-03-22 (×2): 1 [drp] via OPHTHALMIC
  Filled 2016-03-20: qty 2.5

## 2016-03-20 MED ORDER — ONDANSETRON HCL 4 MG/2ML IJ SOLN: 4.0000 mg | Freq: Four times a day (QID) | INTRAMUSCULAR | Status: DC | PRN

## 2016-03-20 MED ORDER — ENOXAPARIN SODIUM 40 MG/0.4ML ~~LOC~~ SOLN
40.0000 mg | SUBCUTANEOUS | Status: DC
  Administered 2016-03-20 – 2016-03-22 (×3): 40 mg via SUBCUTANEOUS
  Filled 2016-03-20 (×3): qty 0.4

## 2016-03-20 MED ORDER — INFLUENZA VAC SPLIT QUAD 0.5 ML IM SUSY
0.5000 mL | PREFILLED_SYRINGE | INTRAMUSCULAR | Status: AC
  Administered 2016-03-21: 0.5 mL via INTRAMUSCULAR
  Filled 2016-03-20: qty 0.5

## 2016-03-20 MED ORDER — SENNOSIDES-DOCUSATE SODIUM 8.6-50 MG PO TABS: 1.0000 | ORAL_TABLET | Freq: Every evening | ORAL | Status: DC | PRN

## 2016-03-20 MED ORDER — METHYLPREDNISOLONE SODIUM SUCC 125 MG IJ SOLR
125.0000 mg | Freq: Once | INTRAMUSCULAR | Status: AC
  Administered 2016-03-20: 125 mg via INTRAVENOUS
  Filled 2016-03-20: qty 2

## 2016-03-20 MED ORDER — INSULIN ASPART 100 UNIT/ML ~~LOC~~ SOLN: 0.0000 [IU] | Freq: Every day | SUBCUTANEOUS | Status: DC

## 2016-03-20 MED ORDER — DEXTROSE 5 % IV SOLN
Freq: Once | INTRAVENOUS | Status: AC
  Administered 2016-03-20: 18:00:00 via INTRAVENOUS

## 2016-03-20 NOTE — ED Notes (Signed)
Pt able to stand and transfer to [facility] without difficulty

## 2016-03-20 NOTE — ED Notes (Signed)
[facility] 71 spoke with Dr Ula Lingo order to start 5% dextrose at 50 ml/hr

## 2016-03-20 NOTE — ED Triage Notes (Signed)
Pt had procedure to open clot in vessel yesterday and biopsy obtained. Brought in by EMS due to altered mental status and slurred speech. EMS reports that per husband she was sitting on couch this morning but had not spoke to her since last night. Noticed when she was eating breakfast ay 0930and she spoke to him her words were slurred and left facial numbness. Able to ambulate with EMS. Per husband not as coherent

## 2016-03-20 NOTE — ED Notes (Addendum)
Dr Shawna Clamp notified and at bedside

## 2016-03-20 NOTE — ED Provider Notes (Addendum)
[facility] DEPT Provider Note   CSN: [id number] Arrival date & time: 03/20/16  1129  By signing my name below, I, Emmanuella Mensah, attest that this documentation has been prepared under the direction and in the presence of Orlie Dakin, MD. Electronically Signed: Judithann Sauger, ED Scribe. 03/20/16. 12:02 PM.   History   Chief Complaint Chief Complaint  Patient presents with  . Altered Mental Status   HPI Comments: Level 5 Caveat due to Altered Mental Status TALONDA ARTIST is a 75 y.o. female with a hx of COPD, DM, hypertension, and fibromyalgia brought in by family to the Emergency Department requesting evaluation for altered mental status. Husband reports that pt was last normal prior to sleeping last night. He adds that this morning while making breakfast, when he heard pt for the first time today, she was slurring her speech with drooping of her left face. He is chronically unsteady gait however gait is worse since this morning.. Per chart review, pt had a biopsy of her right temporal artery obtained yesterday at [facility] in [place], [place]. Pt was referred by Dr. Luan Pulling with concerns for GCA. Family denies any tobacco or ETOH use.   The history is provided by the spouse and a relative. The history is limited by the condition of the patient. No language interpreter was used.  Altered Mental Status   This is a new problem. The current episode started 3 to 5 hours ago. The problem has not changed since onset.   Past Medical History:  Diagnosis Date  . Arthritis    'all over"  . Asthma   . COPD (chronic obstructive pulmonary disease) (HCC)    O2 at nasal prongs  . Depression   . Diabetes mellitus without complication ([place])    borderline- , states she was on med., but MD told her "everything is under control so I threw the bottle away"  . Fibromyalgia   . GERD (gastroesophageal reflux disease)   . Hypertension   . Neuromuscular disorder (HCC)    parkinson, neuropathy-  both feet & hands.    Patient Active Problem List   Diagnosis Date Noted  . Allergic rhinitis 12/17/2015  . Primary osteoarthritis of both knees 11/19/2015  . Obesity (BMI 30-39.9) 09/04/2015  . Essential hypertension 09/04/2015  . Constipation 09/04/2015  . Asthma 08/26/2015  . Raynaud's phenomenon 08/26/2015  . Gastro-esophageal reflux disease with esophagitis 08/26/2015  . Spinal stenosis 08/26/2015  . Vitamin D deficiency 08/26/2015  . Diabetes mellitus, type 2 ([place]) 08/22/2015  . HNP (herniated nucleus pulposus), cervical 07/10/2015  . Cervical radicular pain 04/30/2013  . Knee osteoarthritis 01/05/2012    Past Surgical History:  Procedure Laterality Date  . ABDOMINAL HYSTERECTOMY    . ANTERIOR CERVICAL DECOMP/DISCECTOMY FUSION N/A 07/10/2015   Procedure: Cervical five-six, Cervical six-seven anterior cervical decompression with fusion plating and bonegraft;  Surgeon: Jovita Gamma, MD;  Location: [place] [facility];  Service: Neurosurgery;  Laterality: N/A;  . APPENDECTOMY    . EYE SURGERY Bilateral    cataracts removed, /w "cyrstal lenses"   . SHOULDER ARTHROSCOPY Right    x2   RCR- spurs removed     OB History    No data available       Home Medications    Prior to Admission medications   Medication Sig Start Date End Date Taking? Authorizing Provider  acetaminophen (TYLENOL) 500 MG tablet Take 1,000 mg by mouth every 8 (eight) hours as needed for mild pain or moderate pain.  Historical Provider, MD  albuterol (PROVENTIL HFA;VENTOLIN HFA) 108 (90 Base) MCG/ACT inhaler Inhale 1-2 puffs into the lungs every 6 (six) hours as needed for wheezing or shortness of breath. 01/01/16   Eustaquio Maize, MD  aspirin EC 81 MG tablet Take 1 tablet (81 mg total) by mouth daily. 09/04/15   Sharion Balloon, FNP  blood glucose meter kit and supplies KIT Dispense based on patient and insurance preference. Use up to two times daily as directed (DX: E11.9  type 2 diabetes, uncontrolled)  09/15/15   Sharion Balloon, FNP  BREO ELLIPTA 200-25 MCG/INH AEPB Take 1 puff by mouth daily before breakfast.  04/21/15   Historical Provider, MD  cetirizine (ZYRTEC) 10 MG tablet Take 10 mg by mouth daily.    Historical Provider, MD  cyclobenzaprine (FLEXERIL) 5 MG tablet TAKE 1 TABLET (5 MG TOTAL) BY MOUTH 3 (THREE) TIMES DAILY AS NEEDED FOR MUSCLE SPASMS. 01/05/16   Sharion Balloon, FNP  DULoxetine (CYMBALTA) 60 MG capsule Take 60 mg by mouth daily.     Historical Provider, MD  fluticasone (FLONASE) 50 MCG/ACT nasal spray Place 2 sprays into both nostrils daily. 10/29/15   Sharion Balloon, FNP  glimepiride (AMARYL) 2 MG tablet Take 2 mg by mouth. 08/26/15   Historical Provider, MD  HYDROcodone-acetaminophen (NORCO/VICODIN) 5-325 MG tablet Take 1 tablet by mouth every 12 (twelve) hours as needed for moderate pain. 11/06/15   Sharion Balloon, FNP  HYDROcodone-homatropine (HYCODAN) 5-1.5 MG/5ML syrup Take 2.5-5 mLs by mouth every 8 (eight) hours as needed for cough. 01/01/16   Eustaquio Maize, MD  latanoprost (XALATAN) 0.005 % ophthalmic solution Place 1 drop into both eyes once a week. 06/04/15   Historical Provider, MD  losartan (COZAAR) 100 MG tablet Take 1 tablet (100 mg total) by mouth daily. 09/04/15   Sharion Balloon, FNP  montelukast (SINGULAIR) 10 MG tablet Take 1 tablet by mouth every evening. 05/23/15   Historical Provider, MD  MYRBETRIQ 50 MG TB24 tablet Take 50 mg by mouth daily. 10/11/15   Historical Provider, MD  Glory Rosebush VERIO test strip TEST TWICE DAILY 11/03/15   Sharion Balloon, FNP  OXYGEN Inhale 3-4 L into the lungs at bedtime.    Historical Provider, MD  pantoprazole (PROTONIX) 40 MG tablet Take 1 tablet (40 mg total) by mouth daily. 02/13/16   Sharion Balloon, FNP  polyethylene glycol powder (GLYCOLAX/MIRALAX) powder TAKE 1 CAPFUL IN 8 OUNCES OF WATER JUICE OR TEA AND DRINK DAILY 11/03/15   Sharion Balloon, FNP  PREMARIN 0.9 MG tablet Take 0.9 mg by mouth daily after breakfast.  04/14/15    Historical Provider, MD  rosuvastatin (CRESTOR) 40 MG tablet Take 1 tablet (40 mg total) by mouth daily. 10/27/15   Sharion Balloon, FNP  traZODone (DESYREL) 50 MG tablet TAKE 1 TABLET (50 MG TOTAL) BY MOUTH NIGHTLY AS NEEDED FOR UP TO 30 DAYS FOR SLEEP. 03/08/16   Sharion Balloon, FNP  Vitamin D, Ergocalciferol, (DRISDOL) 50000 units CAPS capsule Take 50,000 Units by mouth once a week. 04/17/15   Historical Provider, MD    Family History Family History  Problem Relation Age of Onset  . Heart disease Mother   . Cancer Father   . Heart disease Father     Social History Social History  Substance Use Topics  . Smoking status: Never Smoker  . Smokeless tobacco: Never Used  . Alcohol use No     Allergies  Azithromycin; Sulfamethoxazole-trimethoprim; Ciprofloxacin; Prednisone; and Tape   Review of Systems Review of Systems  Constitutional: Negative.   HENT: Negative.   Respiratory: Negative.   Cardiovascular: Negative.   Gastrointestinal: Negative.   Musculoskeletal: Positive for gait problem.  Skin: Negative.   Neurological: Positive for facial asymmetry and speech difficulty.  All other systems reviewed and are negative.    Physical Exam Updated Vital Signs There were no vitals taken for this visit.  Physical Exam  Constitutional: She is oriented to person, place, and time. She appears well-developed and well-nourished.  HENT:  Head: Normocephalic and atraumatic.  Right sided mouth droop  Eyes: Conjunctivae are normal.  Pupils pinpoint bilaterally  Neck: Neck supple. No tracheal deviation present. No thyromegaly present.  Cardiovascular: Normal rate and regular rhythm.   No murmur heard. Pulmonary/Chest: Effort normal and breath sounds normal.  Abdominal: Soft. Bowel sounds are normal. She exhibits no distension. There is no tenderness.  Musculoskeletal: Normal range of motion. She exhibits no edema or tenderness.  Neurological: She is alert and oriented to  person, place, and time. Coordination normal.  Speech is slurred.. Pronator drift normal. Moves all extremities DTR symmetric bilaterally knee jerk ankle jerk and biceps toes or going bilaterally. Right sided central cranial nerve VII deficit.  Skin: Skin is warm and dry. No rash noted.  Psychiatric: She has a normal mood and affect.  Nursing note and vitals reviewed.    ED Treatments / Results  DIAGNOSTIC STUDIES: Oxygen Saturation is 93% on RA, adequate by my interpretation.    COORDINATION OF CARE: 11:52 AM- Pt advised of plan for treatment and pt agrees.    Labs (all labs ordered are listed, but only abnormal results are displayed) Labs Reviewed - No data to display  EKG  EKG Interpretation  Date/Time:  Saturday March 20 2016 12:22:04 EDT Ventricular Rate:  69 PR Interval:    QRS Duration: 102 QT Interval:  459 QTC Calculation: 492 R Axis:   -10 Text Interpretation:  Sinus rhythm Short PR interval Low voltage, precordial leads Left ventricular hypertrophy Borderline T abnormalities, diffuse leads Borderline prolonged QT interval No significant change since last tracing Confirmed by Winfred Leeds  MD, Celestine Prim [phone]) on 03/20/2016 12:33:27 PM      Results for orders placed or performed during the hospital encounter of 03/20/16  Ethanol  Result Value Ref Range   Alcohol, Ethyl (B) <5 <5 mg/dL  Protime-INR  Result Value Ref Range   Prothrombin Time 12.8 11.4 - 15.2 seconds   INR 0.96   APTT  Result Value Ref Range   aPTT 28 24 - 36 seconds  CBC  Result Value Ref Range   WBC 6.4 4.0 - 10.5 K/uL   RBC 4.06 3.87 - 5.11 MIL/uL   Hemoglobin 11.5 (L) 12.0 - 15.0 g/dL   HCT 35.5 (L) 36.0 - 46.0 %   MCV 87.4 78.0 - 100.0 fL   MCH 28.3 26.0 - 34.0 pg   MCHC 32.4 30.0 - 36.0 g/dL   RDW 14.1 11.5 - 15.5 %   Platelets 351 150 - 400 K/uL  Differential  Result Value Ref Range   Neutrophils Relative % 60 %   Neutro Abs 3.9 1.7 - 7.7 K/uL   Lymphocytes Relative 26 %   Lymphs  Abs 1.6 0.7 - 4.0 K/uL   Monocytes Relative 10 %   Monocytes Absolute 0.6 0.1 - 1.0 K/uL   Eosinophils Relative 3 %   Eosinophils Absolute 0.2 0.0 - 0.7 K/uL  Basophils Relative 1 %   Basophils Absolute 0.1 0.0 - 0.1 K/uL  Comprehensive metabolic panel  Result Value Ref Range   Sodium 139 135 - 145 mmol/L   Potassium 4.3 3.5 - 5.1 mmol/L   Chloride 105 101 - 111 mmol/L   CO2 29 22 - 32 mmol/L   Glucose, Bld 130 (H) 65 - 99 mg/dL   BUN 17 6 - 20 mg/dL   Creatinine, Ser 1.06 (H) 0.44 - 1.00 mg/dL   Calcium 8.8 (L) 8.9 - 10.3 mg/dL   Total Protein 6.4 (L) 6.5 - 8.1 g/dL   Albumin 3.2 (L) 3.5 - 5.0 g/dL   AST 17 15 - 41 U/L   ALT 15 14 - 54 U/L   Alkaline Phosphatase 104 38 - 126 U/L   Total Bilirubin 0.4 0.3 - 1.2 mg/dL   GFR calc non Af Amer 51 (L) >60 mL/min   GFR calc Af Amer 59 (L) >60 mL/min   Anion gap 5 5 - 15  Troponin I  Result Value Ref Range   Troponin I <0.03 <0.03 ng/mL  Rapid urine drug screen (hospital performed)  Result Value Ref Range   Opiates POSITIVE (A) NONE DETECTED   Cocaine NONE DETECTED NONE DETECTED   Benzodiazepines POSITIVE (A) NONE DETECTED   Amphetamines NONE DETECTED NONE DETECTED   Tetrahydrocannabinol NONE DETECTED NONE DETECTED   Barbiturates NONE DETECTED NONE DETECTED   Ct Head Wo Contrast  Result Date: 03/20/2016 CLINICAL DATA:  Left-sided facial droop with slurred speech, initial encounter EXAM: CT HEAD WITHOUT CONTRAST TECHNIQUE: Contiguous axial images were obtained from the base of the skull through the vertex without intravenous contrast. COMPARISON:  None. FINDINGS: Brain: No evidence of acute infarction, hemorrhage, hydrocephalus, extra-axial collection or mass lesion/mass effect. Atrophic and chronic white matter ischemic changes are noted. Vascular: No hyperdense vessel or unexpected calcification. Skull: Normal. Negative for fracture or focal lesion. Sinuses/Orbits: No acute finding. Other: None. IMPRESSION: Chronic atrophic and  ischemic changes without acute infarct. Electronically Signed   By: Inez Catalina M.D.   On: 03/20/2016 13:22    Radiology No results found.  Procedures Procedures (including critical care time)  Medications Ordered in ED Medications - No data to display   Initial Impression / Assessment and Plan / ED Course  Orlie Dakin, MD has reviewed the triage vital signs and the nursing notes.  Pertinent labs & imaging results that were available during my care of the patient were reviewed by me and considered in my medical decision making (see chart for details).  Clinical Course   2:20 PM exam is unchanged. Speech remained slurred, she moves all extremities. She has right-sided mouth droop. I consulted Dr.Eshrahi, neurologist at [facility]. He will see patient in hospital upon transfer. Family requests transfer to [facility]. I consulted Dr.E. Jerilee Hoh from hospital service here who will arrange for transfer to [facility] Aspirin suppository ordered as patient has failed strokes swallowing screen Code stroke not called as patient out of time window Final Clinical Impressions(s) / ED Diagnoses  Diagnosis: Stroke Final diagnoses:  None    New Prescriptions New Prescriptions   No medications on file   I personally performed the services described in this documentation, which was scribed in my presence. The recorded information has been reviewed and considered.      Orlie Dakin, MD 03/20/16 1433 4:25 PM patient resting comfortably. Alert and appears in no distress   Orlie Dakin, MD 03/20/16 1630

## 2016-03-20 NOTE — ED Notes (Signed)
Dr Hazle Quant notified pt failed swallow study

## 2016-03-20 NOTE — ED Notes (Signed)
Returned from CT.

## 2016-03-20 NOTE — ED Notes (Signed)
Pt transferred to [facility] without difficulty. Continent of B&B

## 2016-03-20 NOTE — H&P (Signed)
History and Physical    Susan Patel XBM:[id number] DOB: Jul 18, 1940 DOA: 03/20/2016  Referring MD/NP/PA: Orlie Dakin, EDP PCP: Evelina Dun, FNP  Patient coming from: Home  Chief Complaint: slurred speech, confusion  HPI: Susan Patel is a 75 y.o. female with h/o HTN, DM, HLD, fibromyalgia. Yesterday she had "a surgery to a blood vessel" in her right temple. Husband says this was done because she was having vision problems. Surgery was done at the urging of a retinal specialist. I wonder if there was a concern for GCA (temporal arteritis) altho she was never started on steroids. No records to this effect on her chart. She was last seen normal at 11:30 pm last night by her husband when they went to bed. This am when they woke up he noticed her to be confused, stumbling, having difficulty with word finding and slurred speech. He subsequently noted a left sided facial droop and brought her to the ED. Her symptoms have persisted. CT Head is without abnormality. EDP has discussed with neurohospitalist at [facility]; due to lack of neurology services and MRI at [facility] over the weekend, she will be transferred to [facility] today. TPA not given since outside window. History given mostly by husband and sister at bedside as patient remains drowsy and confused.  Past Medical/Surgical History: Past Medical History:  Diagnosis Date  . Arthritis    'all over"  . Asthma   . COPD (chronic obstructive pulmonary disease) (HCC)    O2 at nasal prongs  . Depression   . Diabetes mellitus without complication ([place])    borderline- , states she was on med., but MD told her "everything is under control so I threw the bottle away"  . Fibromyalgia   . GERD (gastroesophageal reflux disease)   . Hypertension   . Neuromuscular disorder (HCC)    parkinson, neuropathy- both feet & hands.    Past Surgical History:  Procedure Laterality Date  . ABDOMINAL HYSTERECTOMY    . ANTERIOR CERVICAL DECOMP/DISCECTOMY FUSION N/A 07/10/2015     Procedure: Cervical five-six, Cervical six-seven anterior cervical decompression with fusion plating and bonegraft;  Surgeon: Jovita Gamma, MD;  Location: [place] [facility];  Service: Neurosurgery;  Laterality: N/A;  . APPENDECTOMY    . BIOPSY EYE MUSCLE  03/20/2016   biopsy vessel to right eye due to swelling  . EYE SURGERY Bilateral    cataracts removed, /w "cyrstal lenses"   . SHOULDER ARTHROSCOPY Right    x2   RCR- spurs removed     Social History:  reports that she has never smoked. She has never used smokeless tobacco. She reports that she does not drink alcohol or use drugs.  Allergies: Allergies  Allergen Reactions  . Azithromycin Other (See Comments)    Renal failure  . Sulfamethoxazole-Trimethoprim Other (See Comments)    Renal failure  . Ciprofloxacin     Other reaction(s): Other (See Comments)  . Prednisone     Other reaction(s): Other (See Comments) Irritability and insomnia  . Tape Rash    Family History:  Family History  Problem Relation Age of Onset  . Heart disease Mother   . Cancer Father   . Heart disease Father     Prior to Admission medications   Medication Sig Start Date End Date Taking? Authorizing Provider  HYDROcodone-acetaminophen (NORCO/VICODIN) 5-325 MG tablet Take 1 tablet by mouth every 6 (six) hours. 03/19/16 03/29/16 Yes Historical Provider, MD  acetaminophen (TYLENOL) 500 MG tablet Take 1,000 mg by mouth every  8 (eight) hours as needed for mild pain or moderate pain.    Historical Provider, MD  albuterol (PROVENTIL HFA;VENTOLIN HFA) 108 (90 Base) MCG/ACT inhaler Inhale 1-2 puffs into the lungs every 6 (six) hours as needed for wheezing or shortness of breath. 01/01/16   Eustaquio Maize, MD  aspirin EC 81 MG tablet Take 1 tablet (81 mg total) by mouth daily. 09/04/15   Sharion Balloon, FNP  blood glucose meter kit and supplies KIT Dispense based on patient and insurance preference. Use up to two times daily as directed (DX: E11.9  type 2  diabetes, uncontrolled) 09/15/15   Sharion Balloon, FNP  BREO ELLIPTA 200-25 MCG/INH AEPB Take 1 puff by mouth daily before breakfast.  04/21/15   Historical Provider, MD  cetirizine (ZYRTEC) 10 MG tablet Take 10 mg by mouth daily.    Historical Provider, MD  cyclobenzaprine (FLEXERIL) 5 MG tablet TAKE 1 TABLET (5 MG TOTAL) BY MOUTH 3 (THREE) TIMES DAILY AS NEEDED FOR MUSCLE SPASMS. 01/05/16   Sharion Balloon, FNP  DULoxetine (CYMBALTA) 60 MG capsule Take 60 mg by mouth daily.     Historical Provider, MD  fluticasone (FLONASE) 50 MCG/ACT nasal spray Place 2 sprays into both nostrils daily. 10/29/15   Sharion Balloon, FNP  glimepiride (AMARYL) 2 MG tablet Take 2 mg by mouth. 08/26/15   Historical Provider, MD  HYDROcodone-acetaminophen (NORCO/VICODIN) 5-325 MG tablet Take 1 tablet by mouth every 12 (twelve) hours as needed for moderate pain. 11/06/15   Sharion Balloon, FNP  HYDROcodone-homatropine (HYCODAN) 5-1.5 MG/5ML syrup Take 2.5-5 mLs by mouth every 8 (eight) hours as needed for cough. 01/01/16   Eustaquio Maize, MD  latanoprost (XALATAN) 0.005 % ophthalmic solution Place 1 drop into both eyes once a week. 06/04/15   Historical Provider, MD  losartan (COZAAR) 100 MG tablet Take 1 tablet (100 mg total) by mouth daily. 09/04/15   Sharion Balloon, FNP  montelukast (SINGULAIR) 10 MG tablet Take 1 tablet by mouth every evening. 05/23/15   Historical Provider, MD  MYRBETRIQ 50 MG TB24 tablet Take 50 mg by mouth daily. 10/11/15   Historical Provider, MD  Glory Rosebush VERIO test strip TEST TWICE DAILY 11/03/15   Sharion Balloon, FNP  OXYGEN Inhale 3-4 L into the lungs at bedtime.    Historical Provider, MD  pantoprazole (PROTONIX) 40 MG tablet Take 1 tablet (40 mg total) by mouth daily. 02/13/16   Sharion Balloon, FNP  polyethylene glycol powder (GLYCOLAX/MIRALAX) powder TAKE 1 CAPFUL IN 8 OUNCES OF WATER JUICE OR TEA AND DRINK DAILY 11/03/15   Sharion Balloon, FNP  PREMARIN 0.9 MG tablet Take 0.9 mg by mouth daily after  breakfast.  04/14/15   Historical Provider, MD  rosuvastatin (CRESTOR) 40 MG tablet Take 1 tablet (40 mg total) by mouth daily. 10/27/15   Sharion Balloon, FNP  traZODone (DESYREL) 50 MG tablet TAKE 1 TABLET (50 MG TOTAL) BY MOUTH NIGHTLY AS NEEDED FOR UP TO 30 DAYS FOR SLEEP. 03/08/16   Sharion Balloon, FNP  Vitamin D, Ergocalciferol, (DRISDOL) 50000 units CAPS capsule Take 50,000 Units by mouth once a week. 04/17/15   Historical Provider, MD    Review of Systems:  Difficult to obtain as she is quite drowsy and falls asleep during my examination.   Physical Exam: Vitals:   03/20/16 1430 03/20/16 1500 03/20/16 1530 03/20/16 1600  BP: 167/75 176/73 166/65 171/67  Pulse: 69 70 67 69  Resp: 21  '19 21 18  ' Temp:      TempSrc:      SpO2: 94% 94% 96% 93%  Weight:      Height:         Constitutional: drowsy, able to follow simple commands but falls asleep midway thru. Eyes: PERRL, lids and conjunctivae normal ENMT: Mucous membranes are moist. Posterior pharynx clear of any exudate or lesions.Normal dentition.  Neck: normal, supple, no masses, no thyromegaly Respiratory: clear to auscultation bilaterally, no wheezing, no crackles. Normal respiratory effort. No accessory muscle use.  Cardiovascular: Regular rate and rhythm, no murmurs / rubs / gallops. No extremity edema. 2+ pedal pulses. No carotid bruits.  Abdomen: no tenderness, no masses palpated. No hepatosplenomegaly. Bowel sounds positive.  Musculoskeletal: no clubbing / cyanosis. No joint deformity upper and lower extremities. Good ROM, no contractures. Normal muscle tone.  Skin: no rashes, lesions, ulcers. No induration Neurologic: Left sided facial droop, unable to wrinkle forehead, decreased MS left upper and lower extremity, decreased proprioception left, babinski downgoing bilaterally Psychiatric: Unable to assess given current mental state.   Labs on Admission: I have personally reviewed the following labs and imaging  studies  CBC:  Recent Labs Lab 03/20/16 1243  WBC 6.4  NEUTROABS 3.9  HGB 11.5*  HCT 35.5*  MCV 87.4  PLT 488   Basic Metabolic Panel:  Recent Labs Lab 03/20/16 1243  NA 139  K 4.3  CL 105  CO2 29  GLUCOSE 130*  BUN 17  CREATININE 1.06*  CALCIUM 8.8*   GFR: Estimated Creatinine Clearance: 45.7 mL/min (by C-G formula based on SCr of 1.06 mg/dL (H)). Liver Function Tests:  Recent Labs Lab 03/20/16 1243  AST 17  ALT 15  ALKPHOS 104  BILITOT 0.4  PROT 6.4*  ALBUMIN 3.2*   No results for input(s): LIPASE, AMYLASE in the last 168 hours. No results for input(s): AMMONIA in the last 168 hours. Coagulation Profile:  Recent Labs Lab 03/20/16 1243  INR 0.96   Cardiac Enzymes:  Recent Labs Lab 03/20/16 1243  TROPONINI <0.03   BNP (last 3 results) No results for input(s): PROBNP in the last 8760 hours. HbA1C: No results for input(s): HGBA1C in the last 72 hours. CBG: No results for input(s): GLUCAP in the last 168 hours. Lipid Profile: No results for input(s): CHOL, HDL, LDLCALC, TRIG, CHOLHDL, LDLDIRECT in the last 72 hours. Thyroid Function Tests: No results for input(s): TSH, T4TOTAL, FREET4, T3FREE, THYROIDAB in the last 72 hours. Anemia Panel: No results for input(s): VITAMINB12, FOLATE, FERRITIN, TIBC, IRON, RETICCTPCT in the last 72 hours. Urine analysis:    Component Value Date/Time   COLORURINE YELLOW 03/20/2016 1350   APPEARANCEUR CLEAR 03/20/2016 1350   LABSPEC 1.020 03/20/2016 1350   PHURINE 5.5 03/20/2016 1350   GLUCOSEU NEGATIVE 03/20/2016 1350   HGBUR NEGATIVE 03/20/2016 1350   BILIRUBINUR NEGATIVE 03/20/2016 1350   KETONESUR NEGATIVE 03/20/2016 1350   PROTEINUR NEGATIVE 03/20/2016 1350   NITRITE NEGATIVE 03/20/2016 1350   LEUKOCYTESUR MODERATE (A) 03/20/2016 1350   Sepsis Labs: '@LABRCNTIP' (procalcitonin:4,lacticidven:4) )No results found for this or any previous visit (from the past 240 hour(s)).   Radiological Exams on  Admission: Ct Head Wo Contrast  Result Date: 03/20/2016 CLINICAL DATA:  Left-sided facial droop with slurred speech, initial encounter EXAM: CT HEAD WITHOUT CONTRAST TECHNIQUE: Contiguous axial images were obtained from the base of the skull through the vertex without intravenous contrast. COMPARISON:  None. FINDINGS: Brain: No evidence of acute infarction, hemorrhage, hydrocephalus, extra-axial collection or  mass lesion/mass effect. Atrophic and chronic white matter ischemic changes are noted. Vascular: No hyperdense vessel or unexpected calcification. Skull: Normal. Negative for fracture or focal lesion. Sinuses/Orbits: No acute finding. Other: None. IMPRESSION: Chronic atrophic and ischemic changes without acute infarct. Electronically Signed   By: Inez Catalina M.D.   On: 03/20/2016 13:22    EKG: Independently reviewed. NSR, LVH, NSST changes  Assessment/Plan Principal Problem:   Stroke [facility]) Active Problems:   Diabetes mellitus, type 2 (HCC)   Obesity (BMI 30-39.9)   Essential hypertension   Acute cerebrovascular accident (CVA) ([place])    CVA -Symptoms seem most likely to represent CVA. -Check MRI, ECHO, dopplers, ASA PR, lipids, PT/OT/ST. -Will transfer to [facility] due to lack of MRI and neurology at [facility] over weekend. -Also some concern for GCA given vision problems and what appears to be a temporal artery biopsy on 11/3. -She was never started on steroids. -Not common for GCA to cause CVA but certainly possible. -Will give 125 mg solumedrol x1, followed by prednisone 60 mg daily until results from biopsy can be obtained. -Would be interested is seeing neurology's opinion about this potential diagnosis.  HTN -Fair control at present. -BP meds on hold given NPO state.  DM -Check Aic. -hold orals and start SSI.   DVT prophylaxis: Lovenox  Code Status: full code  Family Communication: husband and sister at bedside updated on plan of care and all questions answered  Disposition Plan:  transfer to [place] called: Neurohospitalist (by EDP) Dr. Denyse Amass Admission status: Inpatient    Time Spent: 85 minutes  Lelon Frohlich MD [facility] Pager [phone]  If 7PM-7AM, please contact night-coverage [email or web address] Password [facility]  03/20/2016, 4:27 PM

## 2016-03-21 ENCOUNTER — Inpatient Hospital Stay (HOSPITAL_COMMUNITY): Payer: Medicare Other

## 2016-03-21 LAB — HEMOGLOBIN A1C
HEMOGLOBIN A1C: 6.6 % — AB (ref 4.8–5.6)
Mean Plasma Glucose: 143 mg/dL

## 2016-03-21 LAB — GLUCOSE, CAPILLARY
GLUCOSE-CAPILLARY: 141 mg/dL — AB (ref 65–99)
GLUCOSE-CAPILLARY: 177 mg/dL — AB (ref 65–99)
Glucose-Capillary: 127 mg/dL — ABNORMAL HIGH (ref 65–99)
Glucose-Capillary: 141 mg/dL — ABNORMAL HIGH (ref 65–99)

## 2016-03-21 LAB — LIPID PANEL
CHOL/HDL RATIO: 5.7 ratio
Cholesterol: 263 mg/dL — ABNORMAL HIGH (ref 0–200)
HDL: 46 mg/dL (ref >40)
LDL CALC: 189 mg/dL — AB (ref 0–99)
TRIGLYCERIDES: 138 mg/dL (ref <150)
VLDL: 28 mg/dL (ref 0–40)

## 2016-03-21 LAB — RAPID URINE DRUG SCREEN, HOSP PERFORMED
Amphetamines: NOT DETECTED
BENZODIAZEPINES: NOT DETECTED
Barbiturates: NOT DETECTED
Cocaine: NOT DETECTED
Opiates: POSITIVE — AB
Tetrahydrocannabinol: NOT DETECTED

## 2016-03-21 MED ORDER — ROSUVASTATIN CALCIUM 20 MG PO TABS
40.0000 mg | ORAL_TABLET | Freq: Every day | ORAL | Status: DC
  Administered 2016-03-21 – 2016-03-23 (×3): 40 mg via ORAL
  Filled 2016-03-21 (×3): qty 2

## 2016-03-21 MED ORDER — ATORVASTATIN CALCIUM 20 MG PO TABS: 20.0000 mg | ORAL_TABLET | Freq: Every day | ORAL | Status: DC

## 2016-03-21 MED ORDER — ASPIRIN EC 81 MG PO TBEC
81.0000 mg | DELAYED_RELEASE_TABLET | Freq: Every day | ORAL | Status: DC
  Administered 2016-03-21 – 2016-03-22 (×2): 81 mg via ORAL
  Filled 2016-03-21 (×2): qty 1

## 2016-03-21 MED ORDER — LABETALOL HCL 5 MG/ML IV SOLN
INTRAVENOUS | Status: AC
  Filled 2016-03-21: qty 4

## 2016-03-21 MED ORDER — RESOURCE THICKENUP CLEAR PO POWD
Freq: Once | ORAL | Status: AC
  Administered 2016-03-21: 11:00:00 via ORAL
  Filled 2016-03-21: qty 125

## 2016-03-21 MED ORDER — TRAZODONE HCL 50 MG PO TABS
50.0000 mg | ORAL_TABLET | Freq: Every evening | ORAL | Status: DC | PRN
  Administered 2016-03-21 – 2016-03-22 (×2): 50 mg via ORAL
  Filled 2016-03-21 (×2): qty 1

## 2016-03-21 NOTE — Evaluation (Signed)
Occupational Therapy Evaluation Patient Details Name: Susan Patel MRN: [id number] DOB: 1940-08-08 Today's Date: 03/21/2016    History of Present Illness 75 y.o. female with h/o HTN, DM, HLD, fibromyalgia. Pt presenting with confusion, stumbling, difficulty with word finding, and slurred speech. MRI on 11/5 positive for acute L internal capsule infarct.    Clinical Impression   Pt reports she was independent with ADL and occasionally used a cane for mobility PTA. Currently pt requires mod hand held assist for functional mobility, min assist for LB ADL with sit to stand, min guard for seated ADL. Pt presenting with expressive deficits, impaired balance (standing>sitting), ?visual deficits impacting her independence and safety with ADL and functional mobility. Recommending CIR level therapies to maximize independence and safety with ADL and functional mobility prior to return home. Pt would benefit from continued skilled OT to address established goals.    Follow Up Recommendations  CIR;Supervision/Assistance - 24 hour    Equipment Recommendations  Other (comment) (TBD at next venue)    Recommendations for Other Services PT consult;Rehab consult     Precautions / Restrictions Precautions Precautions: Fall Restrictions Weight Bearing Restrictions: No      Mobility Bed Mobility Overal bed mobility: Needs Assistance Bed Mobility: Supine to Sit;Sit to Supine     Supine to sit: Min assist Sit to supine: Min guard   General bed mobility comments: Min hand held assist to pull from supine to sit. Min guard for safety with return to bed. HOB slightly elevated with heavy reliance on bed rails.  Transfers Overall transfer level: Needs assistance Equipment used: 1 person hand held assist Transfers: Sit to/from Stand Sit to Stand: Min assist         General transfer comment: Min assist to boost up from EOB and for balance in standing.    Balance Overall balance assessment:  Needs assistance Sitting-balance support: Feet supported;No upper extremity supported Sitting balance-Leahy Scale: Fair     Standing balance support: Single extremity supported Standing balance-Leahy Scale: Poor Standing balance comment: Exernal assist provided and hand held assist for balance.                            ADL Overall ADL's : Needs assistance/impaired     Grooming: Min guard;Sitting   Upper Body Bathing: Min guard;Sitting   Lower Body Bathing: Minimal assistance;Sit to/from stand   Upper Body Dressing : Min guard;Sitting   Lower Body Dressing: Minimal assistance;Sit to/from stand Lower Body Dressing Details (indicate cue type and reason): Pt able to don pants and pull up in standing but required min assist to steady due to balance deficits. Toilet Transfer: Moderate assistance;Ambulation;BSC (hand held assist) Toilet Transfer Details (indicate cue type and reason): Simulated by sit to stand from EOB with functional mobility in room.         Functional mobility during ADLs: Moderate assistance General ADL Comments: Pt with difficutly with word finding and noted to have difficulty managing secretions; coughing frequently.      Vision Vision Assessment?: Yes Eye Alignment: Within Functional Limits Ocular Range of Motion: Within Functional Limits Tracking/Visual Pursuits: Able to track stimulus in all quads without difficulty Saccades: Additional eye shifts occurred during testing;Decreased speed of saccadic movement Convergence: Impaired - to be further tested in functional context Visual Fields: No apparent deficits Additional Comments: Had recent procedure for periphreal vision deficits? Pt unable to express current visual deficits.   Perception  Praxis      Pertinent Vitals/Pain Pain Assessment: No/denies pain     Hand Dominance Right   Extremity/Trunk Assessment Upper Extremity Assessment Upper Extremity Assessment: Overall WFL for  tasks assessed   Lower Extremity Assessment Lower Extremity Assessment: Defer to PT evaluation       Communication Communication Communication: Expressive difficulties   Cognition Arousal/Alertness: Awake/alert Behavior During Therapy: WFL for tasks assessed/performed Overall Cognitive Status: Within Functional Limits for tasks assessed                     General Comments       Exercises       Shoulder Instructions      Home Living Family/patient expects to be discharged to:: Private residence Living Arrangements: Spouse/significant other Available Help at Discharge: Family;Available 24 hours/day Type of Home: House Home Access: Ramped entrance     Home Layout: Two level;Able to live on main level with bedroom/bathroom     Bathroom Shower/Tub: Other (comment) (walk in tub)   Bathroom Toilet: Standard     Home Equipment: Shower seat - built in;Grab bars - tub/shower;Cane - single point          Prior Functioning/Environment Level of Independence: Independent        Comments: Occasional use of cane for mobility.        OT Problem List: Decreased activity tolerance;Impaired balance (sitting and/or standing);Impaired vision/perception;Decreased knowledge of use of DME or AE   OT Treatment/Interventions: Self-care/ADL training;Energy conservation;Neuromuscular education;DME and/or AE instruction;Therapeutic activities;Visual/perceptual remediation/compensation;Patient/family education;Balance training    OT Goals(Current goals can be found in the care plan section) Acute Rehab OT Goals Patient Stated Goal: return home OT Goal Formulation: With patient/family Time For Goal Achievement: 04/04/16 Potential to Achieve Goals: Good ADL Goals Pt Will Perform Grooming: with supervision;standing Pt Will Perform Upper Body Bathing: with supervision;sitting Pt Will Perform Lower Body Bathing: with supervision;sit to/from stand Pt Will Transfer to Toilet:  with supervision;ambulating;regular height toilet Pt Will Perform Toileting - Clothing Manipulation and hygiene: with supervision;sit to/from stand  OT Frequency: Min 2X/week   Barriers to D/C:            Co-evaluation              End of Session Nurse Communication: Mobility status  Activity Tolerance: Patient tolerated treatment well Patient left: in bed;with call bell/phone within reach;with bed alarm set;with family/visitor present   Time: [id number] OT Time Calculation (min): 20 min Charges:  OT General Charges $OT Visit: 1 Procedure OT Evaluation $OT Eval Moderate Complexity: 1 Procedure G-Codes:     Binnie Kand M.S., OTR/L PagerJN:[phone]  03/21/2016, 4:02 PM

## 2016-03-21 NOTE — Progress Notes (Signed)
Modified Barium Swallow Progress Note  Patient Details  Name: Susan Patel MRN: [id number] Date of Birth: 11-07-1940  Today's Date: 03/21/2016  Modified Barium Swallow completed.  Full report located under Chart Review in the Imaging Section.  Brief recommendations include the following:  Clinical Impression  Pt presents with a mild pharyngeal phase dysphagia marked by delayed swallow response to the level of the pyriform sinuses for both nectar and thin liquids; there is immediate and consistent trace aspiration of thin liquids (liquids reach vocal folds and just beneath - triggering a cough response).  Nectar thick liquids did not penetrate the larynx.  There was good pharyngeal clearance and no residue post-swallow.  Postural adjustments (chin tuck, head turn) were not effective in preventing aspiration.  For now, recommend initiating a dysphagia 3 diet with nectar-thick liquids.  Pt and husband viewed video and we discussed results/recommendations.    Swallow Evaluation Recommendations       SLP Diet Recommendations: Dysphagia 3 (Mech soft) solids;Nectar thick liquid   Liquid Administration via: Cup   Medication Administration: Whole meds with puree   Supervision: Patient able to self feed   Compensations: Minimize environmental distractions;Slow rate;Small sips/bites   Postural Changes: Seated upright at 90 degrees   Oral Care Recommendations: Oral care BID   Other Recommendations: Order thickener from pharmacy    Juan Quam Laurice 03/21/2016,10:43 AM

## 2016-03-21 NOTE — Progress Notes (Signed)
PT and OT are recommending IP Rehab.  At this time, we are recommending an IP Rehab consult.  Please order if you are in agreement.    [facility] Admissions Coordinator Cell [phone] Office [phone]

## 2016-03-21 NOTE — Evaluation (Signed)
Speech Language Pathology Evaluation Patient Details Name: Susan Patel MRN: [id number] DOB: [birth year] Today's Date: 03/21/2016 Time: 0901-0920 SLP Time Calculation (min) (ACUTE ONLY): 19 min  Problem List:  Patient Active Problem List   Diagnosis Date Noted  . Stroke ([place]) 03/20/2016  . Acute cerebrovascular accident (CVA) ([place]) 03/20/2016  . Allergic rhinitis 12/17/2015  . Primary osteoarthritis of both knees 11/19/2015  . Obesity (BMI 30-39.9) 09/04/2015  . Essential hypertension 09/04/2015  . Constipation 09/04/2015  . Asthma 08/26/2015  . Raynaud's phenomenon 08/26/2015  . Gastro-esophageal reflux disease with esophagitis 08/26/2015  . Spinal stenosis 08/26/2015  . Vitamin D deficiency 08/26/2015  . Diabetes mellitus, type 2 ([place]) 08/22/2015  . HNP (herniated nucleus pulposus), cervical 07/10/2015  . Cervical radicular pain 04/30/2013  . Knee osteoarthritis 01/05/2012   Past Medical History:  Past Medical History:  Diagnosis Date  . Arthritis    'all over"  . Asthma   . COPD (chronic obstructive pulmonary disease) (HCC)    O2 at nasal prongs  . Depression   . Diabetes mellitus without complication ([place])    borderline- , states she was on med., but MD told her "everything is under control so I threw the bottle away"  . Fibromyalgia   . GERD (gastroesophageal reflux disease)   . Hypertension   . Neuromuscular disorder (HCC)    parkinson, neuropathy- both feet & hands.   Past Surgical History:  Past Surgical History:  Procedure Laterality Date  . ABDOMINAL HYSTERECTOMY    . ANTERIOR CERVICAL DECOMP/DISCECTOMY FUSION N/A 07/10/2015   Procedure: Cervical five-six, Cervical six-seven anterior cervical decompression with fusion plating and bonegraft;  Surgeon: Jovita Gamma, MD;  Location: [facility];  Service: Neurosurgery;  Laterality: N/A;  . APPENDECTOMY    . BIOPSY EYE MUSCLE  03/20/2016   biopsy vessel to right eye due to swelling  . EYE SURGERY Bilateral     cataracts removed, /w "cyrstal lenses"   . SHOULDER ARTHROSCOPY Right    x2   RCR- spurs removed    HPI:  75 y.o.femalewith h/o HTN, DM, HLD, fibromyalgia, GERD admitted with symptoms of stroke. 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  Failed RN stroke swallow.  MRI left internal capsule infarct; Stroke w/u pending.    Assessment / Plan / Recommendation Clinical Impression  Pt presents with a moderate dysarthria of speech s/p left internal capsule CVA; there is mild word-retrieval difficulty for divergent naming tasks; comprehension generally WFL; expression is fluent but less spontaneous than usual per husband.  Recommend speech therapy post-D/C; disposition pending PT/OT evals.     SLP Assessment  Patient needs continued Speech Language Pathology Services    Follow Up Recommendations   (tba pending PT/OT evals)    Frequency and Duration min 2x/week  2 weeks      SLP Evaluation Cognition  Overall Cognitive Status: Within Functional Limits for tasks assessed Arousal/Alertness: Awake/alert Orientation Level: Oriented X4       Comprehension  Auditory Comprehension Overall Auditory Comprehension: Impaired Yes/No Questions: Within Functional Limits Commands: Within Functional Limits Conversation: Simple EffectiveTechniques: Extra processing time Visual Recognition/Discrimination Discrimination: Within Function Limits Reading Comprehension Reading Status: Not tested    Expression Expression Primary Mode of Expression: Verbal Verbal Expression Overall Verbal Expression: Impaired Initiation: No impairment Automatic Speech: Name;Social Response Level of Generative/Spontaneous Verbalization: Conversation Repetition: No impairment Naming: Impairment Confrontation: Within functional limits Divergent: 50-74% accurate Pragmatics: No impairment Written Expression Dominant Hand: Right   Oral / Motor  Oral Motor/Sensory Function Overall Oral  Motor/Sensory Function: Mild impairment Facial ROM: Suspected CN VII (facial) dysfunction;Reduced right Facial Symmetry: Suspected CN VII (facial) dysfunction;Abnormal symmetry right Motor Speech Overall Motor Speech: Impaired Phonation: Low vocal intensity Articulation: Impaired Level of Impairment: Word Intelligibility: Intelligibility reduced Phrase: 75-100% accurate Sentence: 75-100% accurate Conversation: 75-100% accurate   GO                    Susan Patel 03/21/2016, 9:32 AM  Estill Bamberg L. Tivis Ringer, [place] CCC/SLP Pager [phone]

## 2016-03-21 NOTE — Consult Note (Signed)
Subjective: Very dysarthric.  Failed swallowing study.     She denies any severe headaches or loss of vision to prompt temporal artery biopsy.  She had some non-specific visual changes in the right eye described as cow webs or crystals.  Extensive retinal and optic evaluations were normal per husband.  She was not taking ASA daily and faithfully when this happened.    Objective: Vital signs in last 24 hours: Temp:  [98 F (36.7 C)-98.4 F (36.9 C)] 98.4 F (36.9 C) (11/05 0358) Pulse Rate:  [73-98] 97 (11/05 0358) Resp:  [14-23] 17 (11/04 2300) BP: (111-170)/(70-103) 162/99 (11/05 0429) SpO2:  [67 %-99 %] 94 % (11/05 0358) Weight:  [81.6 kg (180 lb)-82.6 kg (182 lb)] 82.6 kg (182 lb) (11/05 0519)  Intake/Output from previous day: 11/04 0701 - 11/05 0700 In: 2000 [IV Piggyback:2000] Out: 2300 [Urine:2300] Intake/Output this shift: No intake/output data recorded. Nutritional status: Diet regular Room service appropriate? Yes; Fluid consistency: Thin  Neurologic Exam: Awake, alert, fully oriented. Significant dysarthria.  Language-fluent, C/N/R-intact Face symmetric Strength 5/5 bilateral.  No pronator drift.  ?equivocal babinski on the right. Sensory intact.   Coord intact  Lab Results:  Recent Labs (last 2 labs)    Recent Labs  03/20/16 1538 03/20/16 1613 03/21/16 0535  WBC 7.2  --  8.5  HGB 14.1 15.0 13.7  HCT 41.1 44.0 41.0  PLT 288  --  268  NA 139 143 139  K 4.3 4.0 4.0  CL 105 102 106  CO2 26  --  23  GLUCOSE 107* 100* 79  BUN 5* 4* <5*  CREATININE 0.84 1.40* 0.84  CALCIUM 9.2  --  8.6*     Lipid Panel Recent Labs (last 2 labs)   No results for input(s): CHOL, TRIG, HDL, CHOLHDL, VLDL, LDLCALC in the last 72 hours.    Studies/Results:  Imaging Results (Last 48 hours)  Ct Head Wo Contrast  Result Date: 03/20/2016 CLINICAL DATA:  75 year old female with a history of dizziness and lightheadedness EXAM: CT HEAD WITHOUT CONTRAST TECHNIQUE:  Contiguous axial images were obtained from the base of the skull through the vertex without intravenous contrast. COMPARISON:  11/18/2015, 07/17/2015, 07/17/2015 FINDINGS: Brain: No acute intracranial hemorrhage. No midline shift or mass effect. Gray-white differentiation maintained. Unremarkable appearance of the ventricular system. Vascular: Unremarkable. Skull: No acute fracture.  No aggressive bone lesion identified. Sinuses/Orbits: Unremarkable appearance of the orbits. Mastoid air cells clear. No middle ear effusion. No significant sinus disease. Other: None IMPRESSION: No CT evidence of acute intracranial abnormality. Signed, Dulcy Fanny. Earleen Newport, DO [facility] Electronically Signed   By: Corrie Mckusick D.O.   On: 03/20/2016 17:50   Mr Brain Wo Contrast  Result Date: 03/21/2016 CLINICAL DATA:  CVA.  Right-sided weakness for 3 weeks.  Vomiting. EXAM: MRI HEAD WITHOUT CONTRAST TECHNIQUE: Multiplanar, multiecho pulse sequences of the brain and surrounding structures were obtained without intravenous contrast. COMPARISON:  Head CT 03/20/2016, brain MRI 07/17/2015 FINDINGS: Brain: No acute infarct or intraparenchymal hemorrhage. The midline structures are normal. No focal parenchymal signal abnormality. No mass lesion or midline shift. No hydrocephalus or extra-axial fluid collection. There is mild volume loss for age. Area of CSF signal at the right basal ganglia is favored to be a prominent perivascular space. Vascular: Major intracranial arterial and venous sinus flow voids are preserved. No evidence of chronic microhemorrhage or amyloid angiopathy. Skull and upper cervical spine: The visualized skull base, calvarium, upper cervical spine  and extracranial soft tissues are normal. Sinuses/Orbits: No fluid levels or advanced mucosal thickening. No mastoid effusion. Normal orbits. IMPRESSION: 1. No acute intracranial abnormality. 2. Mild brain parenchymal  volume loss for age. Electronically Signed   By: Ulyses Jarred M.D.   On: 03/21/2016 00:36   Dg Chest Portable 1 View  Result Date: 03/20/2016 CLINICAL DATA:  Chest pain EXAM: PORTABLE CHEST 1 VIEW COMPARISON:  11/18/2015 chest radiograph. FINDINGS: Stable cardiomediastinal silhouette with normal heart size. No pneumothorax. No pleural effusion. Lungs appear clear, with no acute consolidative airspace disease and no pulmonary edema. IMPRESSION: No active disease. Electronically Signed   By: Ilona Sorrel M.D.   On: 03/20/2016 17:03   Ct Angio Chest/abd/pel For Dissection W And/or W/wo  Result Date: 03/20/2016 CLINICAL DATA:  Chest pain going into right arm and right leg for the past 3 weeks. Shortness of breath. EXAM: CT ANGIOGRAPHY CHEST, ABDOMEN AND PELVIS TECHNIQUE: Multidetector CT imaging through the chest, abdomen and pelvis was performed using the standard protocol during bolus administration of intravenous contrast. Multiplanar reconstructed images and MIPs were obtained and reviewed to evaluate the vascular anatomy. CONTRAST:  100 cc of Isovue 370 COMPARISON:  Chest radiograph of 03/20/2016.  No prior CT. FINDINGS: CTA CHEST FINDINGS Cardiovascular: No intramural hematoma on noncontrast imaging. Normal caliber of the great vessels. Aortic and branch vessel atherosclerosis. No aortic dissection. Normal heart size, without pericardial effusion. Significantly age advanced coronary artery atherosclerosis, including within the left main on image 32/series 5. No central pulmonary embolism, on this non-dedicated study. Mediastinum/Nodes: No mediastinal or hilar adenopathy. Lungs/Pleura: No pleural fluid. Mild degradation secondary to beam hardening artifact from EKG leads warrant. Subpleural right lower lobe 3 mm pulmonary nodule on image 76/series 7. Posterior right upper lobe 4 mm nodule on image 56/series 7. Musculoskeletal: No acute osseous abnormality. Review of the MIP images confirms the above  findings. CTA ABDOMEN AND PELVIS FINDINGS VASCULAR Aorta: Normal in caliber.  Atherosclerosis within.  No dissection. Celiac: Patent, with mild atherosclerotic irregularity. SMA: Moderate atherosclerotic irregularity within. Mild narrowing, on the order of 30% on image 172/series 6. Renals: Accessory lower pole right renal artery. Atherosclerosis at the origin of both renals, without significant stenosis. IMA: Patent. Inflow: Atherosclerosis without aneurysm, dissection, or significant stenosis involving the iliac vasculature. Veins: Venous system not well evaluated secondary to bolus timing. Review of the MIP images confirms the above findings. NON-VASCULAR Hepatobiliary: Moderate hepatic steatosis. Caudate lobe enlargement subtle irregular hepatic capsule. No focal liver lesion. Normal gallbladder, without biliary ductal dilatation. Pancreas: Normal, without mass or ductal dilatation. Spleen: Normal in size, without focal abnormality. Adrenals/Urinary Tract: Normal adrenal glands. Normal kidneys, without hydronephrosis. Moderate bladder distention. Stomach/Bowel: Normal stomach, without wall thickening. Normal colon, appendix, and terminal ileum. Normal small bowel. Lymphatic: No abdominopelvic adenopathy. Reproductive: Normal prostate. Other: No significant free fluid. Musculoskeletal: Moderate thoracolumbar spondylosis. Disc bulge at L4-5. Review of the MIP images confirms the above findings. IMPRESSION: 1. No evidence of aortic dissection. 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 2. Age advanced coronary artery atherosclerosis. Recommend assessment of coronary risk factors and consideration of medical therapy. 3. Hepatic steatosis with mild cirrhosis. 4. Bladder distension. 5. Small pulmonary nodules. No follow-up needed if patient is low-risk. Non-contrast chest CT can be considered in 12 months if patient is high-risk. This recommendation follows the  consensus statement: Guidelines for Management of Incidental Pulmonary Nodules Detected on CT Images: From the Fleischner Society 2017; Radiology 2017; 284:228-243. Electronically  Signed   By: Abigail Miyamoto M.D.   On: 03/20/2016 18:07     Medications:  ASA 81 mg, Lipitor 20 mg qd   Assessment/Plan:  1. Acute left internal capsule ischemic infarct with minimal residual weakness and only prominent dysarthria and dysphagia.  Started ASA and Lipitor.  Awaiting CDUS and TTE.  2. History is not suggestive of Temporal Arteritis.  I will check an ESR.  If not significantly elevated, we can stop the Prednisone.  Awaiting temporal artery biopsy result.       LOS: 0 days   Rogue Jury, MD 03/21/2016  9:47 AM

## 2016-03-21 NOTE — Evaluation (Signed)
Clinical/Bedside Swallow Evaluation Patient Details  Name: Susan Patel MRN: [id number] Date of Birth: [birth year]  Today's Date: 03/21/2016 Time: SLP Start Time (ACUTE ONLY): 0848 SLP Stop Time (ACUTE ONLY): 0901 SLP Time Calculation (min) (ACUTE ONLY): 13 min  Past Medical History:  Past Medical History:  Diagnosis Date  . Arthritis    'all over"  . Asthma   . COPD (chronic obstructive pulmonary disease) (H[facility])    O2 at nasal prongs  . Depression   . Diabetes mellitus without complication ([place])    borderline- , states she was on med., but MD told her "everything is under control so I threw the bottle away"  . Fibromyalgia   . GERD (gastroesophageal reflux disease)   . Hypertension   . Neuromuscular disorder (HCC)    parkinson, neuropathy- both feet & hands.   Past Surgical History:  Past Surgical History:  Procedure Laterality Date  . ABDOMINAL HYSTERECTOMY    . ANTERIOR CERVICAL DECOMP/DISCECTOMY FUSION N/A 07/10/2015   Procedure: Cervical five-six, Cervical six-seven anterior cervical decompression with fusion plating and bonegraft;  Surgeon: Jovita Gamma, MD;  Location: [facility];  Service: Neurosurgery;  Laterality: N/A;  . APPENDECTOMY    . BIOPSY EYE MUSCLE  03/20/2016   biopsy vessel to right eye due to swelling  . EYE SURGERY Bilateral    cataracts removed, /w "cyrstal lenses"   . SHOULDER ARTHROSCOPY Right    x2   RCR- spurs removed    HPI:  75 y.o.femalewith h/o HTN, DM, HLD, fibromyalgia, GERD, ACDF Feb 2017 admitted with symptoms of stroke. 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  Failed RN stroke swallow.  MRI left internal capsule infarct; Stroke w/u pending.    Assessment / Plan / Recommendation Clinical Impression  Pt presents with s/s of dysphagia with concerns for aspiration, displaying difficulty with thin and nectar-thick liquids and purees.  Immediate coughing, wet-phonation notable throughout exam.  Recommend proceeding with  MBS today - D/w family, RN.     Aspiration Risk       Diet Recommendation    Npo pending MBS      Other  Recommendations Oral Care Recommendations: Oral care QID   Follow up Recommendations        Frequency and Duration            Prognosis        Swallow Study   General Date of Onset: 03/20/16 HPI: 75 y.o.femalewith h/o HTN, DM, HLD, fibromyalgia, GERD admitted with symptoms of stroke. 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  Failed RN stroke swallow.  MRI left internal capsule infarct; Stroke w/u pending.  Type of Study: Bedside Swallow Evaluation Previous Swallow Assessment: no Diet Prior to this Study: NPO Temperature Spikes Noted: No Respiratory Status: Room air History of Recent Intubation: No Behavior/Cognition: Alert;Cooperative Oral Cavity Assessment: Within Functional Limits Oral Care Completed by SLP: No Oral Cavity - Dentition: Adequate natural dentition Vision: Functional for self-feeding Self-Feeding Abilities: Able to feed self Patient Positioning: Upright in bed Baseline Vocal Quality: Normal Volitional Cough: Weak Volitional Swallow: Able to elicit    Oral/Motor/Sensory Function Overall Oral Motor/Sensory Function: Mild impairment Facial ROM: Suspected CN VII (facial) dysfunction;Reduced right Facial Symmetry: Suspected CN VII (facial) dysfunction;Abnormal symmetry right   Ice Chips Ice chips: Not tested   Thin Liquid Thin Liquid: Impaired Presentation: Cup;Self Fed Pharyngeal  Phase Impairments: Multiple swallows;Cough - Immediate    Nectar Thick Nectar Thick Liquid: Impaired Presentation: Self  Fed;Cup Pharyngeal Phase Impairments: Cough - Immediate   Honey Thick Honey Thick Liquid: Not tested   Puree Puree: Within functional limits   Solid   GO   Solid: Impaired Pharyngeal Phase Impairments: Multiple swallows;Cough - Immediate        Juan Quam Laurice 03/21/2016,9:24 AM Estill Bamberg L. Tivis Ringer, [place] CCC/SLP Pager  [phone]

## 2016-03-21 NOTE — Progress Notes (Signed)
PROGRESS NOTE    Susan Patel  HGD:[id number] DOB: 12/24/1940 DOA: 03/20/2016 PCP: Evelina Dun, FNP   Chief Complaint  Patient presents with  . Altered Mental Status    Brief Narrative:  HPI on 03/20/2016 by Dr. Domingo Mend Susan Patel is a 75 y.o. female with h/o HTN, DM, HLD, fibromyalgia. Yesterday she had "a surgery to a blood vessel" in her right temple. Husband says this was done because she was having vision problems. Surgery was done at the urging of a retinal specialist. I wonder if there was a concern for GCA (temporal arteritis) altho she was never started on steroids. No records to this effect on her chart. She was last seen normal at 11:30 pm last night by her husband when they went to bed. This am when they woke up he noticed her to be confused, stumbling, having difficulty with word finding and slurred speech. He subsequently noted a left sided facial droop and brought her to the ED. Her symptoms have persisted. CT Head is without abnormality. EDP has discussed with neurohospitalist at [facility] [address]; due to lack of neurology services and MRI at [facility] over the weekend, she will be transferred to [facility] today. TPA not given since outside window. History given mostly by husband and sister at bedside as patient remains drowsy and confused. Assessment & Plan   Acute CVA  -CT head: unremarkable for CVA -MRI brain: Acute left internal capsule infarct.  -MRA brain: no large vessel occlusion  -LDL: 189 -Hemoglobin A1c 6.6 -Started on statin and aspirin today -?concern for giant cell arteritis/temporal arteritis. S/p biopsy on 11/3. Started on prednisone. ESR pending   -Neurology consulted and appreciated -pending echocardiogram, carotid doppler, PT/OT/Speech evaulations  Essential hypertension -Allow for permissive hypertension -losartan held  -Continue ISS and CBG monitoring   Diabetes mellitus, Type II -A1c 6.6  -Glimepiride held  Hyperlipidiemia -Lipid panel: LDl 263, TG 138,  LDL 189, HDL 46 -restarted statin  DVT Prophylaxis  lovneox  Code Status: Full  Family Communication: Family at bedside  Disposition Plan: Admitted. Pending further workup  Consultants Neurology  Procedures  None  Antibiotics   Anti-infectives    None      Subjective:   Susan Patel seen and examined today.  Patient states she is feeling much better today. Husband feels her speech is improving.  Denies dizziness, chest pain, shortness of breath, abdominal pain, N/V/D/C.  Objective:   Vitals:   03/21/16 0645 03/21/16 0647 03/21/16 0732 03/21/16 0812  BP: (!) 186/71   (!) 161/68  Pulse: 71 69  82  Resp: 20     Temp: 98.1 F (36.7 C)   97.4 F (36.3 C)  TempSrc: Oral   Oral  SpO2: 98% 97% 98% 100%  Weight:      Height:        Intake/Output Summary (Last 24 hours) at 03/21/16 1240 Last data filed at 03/21/16 0612  Gross per 24 hour  Intake                4 ml  Output              650 ml  Net             -646 ml   Filed Weights   03/20/16 1143  Weight: 81.6 kg (180 lb)    Exam  General: Well developed, well nourished, NAD, appears stated age  HEENT: NCAT, PERRLA, EOMI, Anicteic Sclera, mucous membranes moist.   Neck: Supple,  no JVD, no masses  Cardiovascular: S1 S2 auscultated, no rubs, murmurs or gallops. Regular rate and rhythm.  Respiratory: Clear to auscultation bilaterally with equal chest rise  Abdomen: Soft, nontender, nondistended, + bowel sounds  Extremities: warm dry without cyanosis clubbing or edema  Neuro: AAOx3, speech slightly slurred, mild left facial droop (improving per husband), strength equal and bilateral in upper/lower ext  Skin: Without rashes exudates or nodules  Psych: Normal affect and demeanor with intact judgement and insight   Data Reviewed: I have personally reviewed following labs and imaging studies  CBC:  Recent Labs Lab 03/20/16 1243 03/20/16 2010  WBC 6.4 8.1  NEUTROABS 3.9  --   HGB 11.5* 12.7  HCT  35.5* 38.2  MCV 87.4 85.1  PLT 351 355   Basic Metabolic Panel:  Recent Labs Lab 03/20/16 1243 03/20/16 2010  NA 139  --   K 4.3  --   CL 105  --   CO2 29  --   GLUCOSE 130*  --   BUN 17  --   CREATININE 1.06* 0.89  CALCIUM 8.8*  --    GFR: Estimated Creatinine Clearance: 54.5 mL/min (by C-G formula based on SCr of 0.89 mg/dL). Liver Function Tests:  Recent Labs Lab 03/20/16 1243  AST 17  ALT 15  ALKPHOS 104  BILITOT 0.4  PROT 6.4*  ALBUMIN 3.2*   No results for input(s): LIPASE, AMYLASE in the last 168 hours. No results for input(s): AMMONIA in the last 168 hours. Coagulation Profile:  Recent Labs Lab 03/20/16 1243  INR 0.96   Cardiac Enzymes:  Recent Labs Lab 03/20/16 1243  TROPONINI <0.03   BNP (last 3 results) No results for input(s): PROBNP in the last 8760 hours. HbA1C:  Recent Labs  03/20/16 1243  HGBA1C 6.6*   CBG:  Recent Labs Lab 03/20/16 1727 03/20/16 2232 03/21/16 0848 03/21/16 1237  GLUCAP 69 192* 141* 127*   Lipid Profile:  Recent Labs  03/21/16 0517  CHOL 263*  HDL 46  LDLCALC 189*  TRIG 138  CHOLHDL 5.7   Thyroid Function Tests: No results for input(s): TSH, T4TOTAL, FREET4, T3FREE, THYROIDAB in the last 72 hours. Anemia Panel: No results for input(s): VITAMINB12, FOLATE, FERRITIN, TIBC, IRON, RETICCTPCT in the last 72 hours. Urine analysis:    Component Value Date/Time   COLORURINE YELLOW 03/20/2016 1350   APPEARANCEUR CLEAR 03/20/2016 1350   LABSPEC 1.020 03/20/2016 1350   PHURINE 5.5 03/20/2016 1350   GLUCOSEU NEGATIVE 03/20/2016 1350   HGBUR NEGATIVE 03/20/2016 1350   BILIRUBINUR NEGATIVE 03/20/2016 1350   KETONESUR NEGATIVE 03/20/2016 1350   PROTEINUR NEGATIVE 03/20/2016 1350   NITRITE NEGATIVE 03/20/2016 1350   LEUKOCYTESUR MODERATE (A) 03/20/2016 1350   Sepsis Labs: '@LABRCNTIP' (procalcitonin:4,lacticidven:4)  )No results found for this or any previous visit (from the past 240 hour(s)).     Radiology Studies: Ct Head Wo Contrast  Result Date: 03/20/2016 CLINICAL DATA:  Left-sided facial droop with slurred speech, initial encounter EXAM: CT HEAD WITHOUT CONTRAST TECHNIQUE: Contiguous axial images were obtained from the base of the skull through the vertex without intravenous contrast. COMPARISON:  None. FINDINGS: Brain: No evidence of acute infarction, hemorrhage, hydrocephalus, extra-axial collection or mass lesion/mass effect. Atrophic and chronic white matter ischemic changes are noted. Vascular: No hyperdense vessel or unexpected calcification. Skull: Normal. Negative for fracture or focal lesion. Sinuses/Orbits: No acute finding. Other: None. IMPRESSION: Chronic atrophic and ischemic changes without acute infarct. Electronically Signed   By: Linus Mako.D.  On: 03/20/2016 13:22   Mr Brain Wo Contrast  Result Date: 03/21/2016 CLINICAL DATA:  Facial droop with slurred speech. EXAM: MRI HEAD WITHOUT CONTRAST MRA HEAD WITHOUT CONTRAST TECHNIQUE: Multiplanar, multiecho pulse sequences of the brain and surrounding structures were obtained without intravenous contrast. Angiographic images of the head were obtained using MRA technique without contrast. COMPARISON:  Head CT 03/20/2016 FINDINGS: MRI HEAD FINDINGS Brain: There is a small acute infarct in the posterior limb of the left internal capsule. There is no evidence of intracranial hemorrhage, mass, midline shift, or extra-axial fluid collection. There is moderate cerebral atrophy. Chronic lacunar infarcts are present in the right lentiform nucleus, left thalamus, corpus callosum, and in the periventricular white matter bilaterally. Cerebral white matter T2 hyperintensities are compatible with extensive chronic small vessel ischemic disease. Mild chronic small vessel ischemic changes are present in the pons. Vascular: Major intracranial vascular flow voids are preserved. Skull and upper cervical spine: No suspicious osseous lesion  identified. Prior anterior cervical fusion. Sinuses/Orbits: Prior bilateral cataract extraction. Trace left mastoid effusion. Clear paranasal sinuses. Other: None. MRA HEAD FINDINGS The visualized distal vertebral arteries are patent to the basilar with the right being mildly dominant. There is mild irregularity an up to mild narrowing of both vertebral arteries. Right AICA appears dominant. PICAs are not clearly identified. SCA origins are patent. Basilar artery is patent without significant stenosis. The posterior communicating arteries are not identified. There are severe left P1 and and bilateral P2 stenoses with right greater than left distal PCA attenuation. The internal carotid arteries are patent from skullbase to carotid termini with mild right and moderate left supraclinoid segment stenoses. A1 and M1 segments are patent without evidence of significant stenosis. There are moderate proximal M2 stenoses bilaterally. There are also moderate to severe distal A2/ proximal A3 stenoses. No intracranial aneurysm is identified. IMPRESSION: 1. Acute left internal capsule infarct. 2. Extensive chronic small vessel ischemic disease with numerous chronic lacunar infarcts. 3. No large vessel occlusion. 4. Intracranial atherosclerosis as above including mild bilateral vertebral, severe bilateral PCA, mild right ICA, and moderate left ICA stenoses. Electronically Signed   By: Logan Bores M.D.   On: 03/21/2016 07:23   Dg Swallowing Func-speech Pathology  Result Date: 03/21/2016 Objective Swallowing Evaluation: Type of Study: MBS-Modified Barium Swallow Study Patient Details Name: KATHLYNE LOUD MRN: [id number] Date of Birth: 09-12-1940 Today's Date: 03/21/2016 Time: SLP Start Time (ACUTE ONLY): 1015-SLP Stop Time (ACUTE ONLY): 1030 SLP Time Calculation (min) (ACUTE ONLY): 15 min Past Medical History: Past Medical History: Diagnosis Date . Arthritis   'all over" . Asthma  . COPD (chronic obstructive pulmonary disease)  (HCC)   O2 at nasal prongs . Depression  . Diabetes mellitus without complication (Man)   borderline- , states she was on med., but MD told her "everything is under control so I threw the bottle away" . Fibromyalgia  . GERD (gastroesophageal reflux disease)  . Hypertension  . Neuromuscular disorder (HCC)   parkinson, neuropathy- both feet & hands. Past Surgical History: Past Surgical History: Procedure Laterality Date . ABDOMINAL HYSTERECTOMY   . ANTERIOR CERVICAL DECOMP/DISCECTOMY FUSION N/A 07/10/2015  Procedure: Cervical five-six, Cervical six-seven anterior cervical decompression with fusion plating and bonegraft;  Surgeon: Jovita Gamma, MD;  Location: [facility];  Service: Neurosurgery;  Laterality: N/A; . APPENDECTOMY   . BIOPSY EYE MUSCLE  03/20/2016  biopsy vessel to right eye due to swelling . EYE SURGERY Bilateral   cataracts removed, /w "cyrstal lenses"  . SHOULDER  ARTHROSCOPY Right   x2   RCR- spurs removed  HPI: 75 y.o.femalewith h/o HTN, DM, HLD, fibromyalgia, GERD, ACDF Feb 2017 admitted with symptoms of stroke. 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  Failed RN stroke swallow.  MRI left internal capsule infarct; Stroke w/u pending.  Subjective: pleasant; husband present Assessment / Plan / Recommendation CHL IP CLINICAL IMPRESSIONS 03/21/2016 Therapy Diagnosis Mild pharyngeal phase dysphagia Clinical Impression Pt presents with a mild pharyngeal phase dysphagia marked by delayed swallow response to the level of the pyriform sinuses for both nectar and thin liquids; there is immediate and consistent trace aspiration of thin liquids (liquids reach vocal folds and just beneath - triggering a cough response).  Nectar thick liquids did not penetrate the larynx.  There was good pharyngeal clearance and no residue post-swallow.  Postural adjustments (chin tuck, head turn) were not effective in preventing aspiration.  For now, recommend initiating a dysphagia 3 diet with nectar-thick  liquids.  Pt and husband viewed video and we discussed results/recommendations.  Impact on safety and function Mild aspiration risk   CHL IP TREATMENT RECOMMENDATION 03/21/2016 Treatment Recommendations Therapy as outlined in treatment plan below   Prognosis 03/21/2016 Prognosis for Safe Diet Advancement Good Barriers to Reach Goals -- Barriers/Prognosis Comment -- CHL IP DIET RECOMMENDATION 03/21/2016 SLP Diet Recommendations Dysphagia 3 (Mech soft) solids;Nectar thick liquid Liquid Administration via Cup Medication Administration Whole meds with puree Compensations Minimize environmental distractions;Slow rate;Small sips/bites Postural Changes Seated upright at 90 degrees   CHL IP OTHER RECOMMENDATIONS 03/21/2016 Recommended Consults -- Oral Care Recommendations Oral care BID Other Recommendations Order thickener from pharmacy   CHL IP FOLLOW UP RECOMMENDATIONS 03/21/2016 Follow up Recommendations (No Data)   CHL IP FREQUENCY AND DURATION 03/21/2016 Speech Therapy Frequency (ACUTE ONLY) min 2x/week Treatment Duration 1 week      CHL IP ORAL PHASE 03/21/2016 Oral Phase WFL Oral - Pudding Teaspoon -- Oral - Pudding Cup -- Oral - Honey Teaspoon -- Oral - Honey Cup -- Oral - Nectar Teaspoon -- Oral - Nectar Cup -- Oral - Nectar Straw -- Oral - Thin Teaspoon -- Oral - Thin Cup -- Oral - Thin Straw -- Oral - Puree -- Oral - Mech Soft -- Oral - Regular -- Oral - Multi-Consistency -- Oral - Pill -- Oral Phase - Comment --  CHL IP PHARYNGEAL PHASE 03/21/2016 Pharyngeal Phase Impaired Pharyngeal- Pudding Teaspoon -- Pharyngeal -- Pharyngeal- Pudding Cup -- Pharyngeal -- Pharyngeal- Honey Teaspoon -- Pharyngeal -- Pharyngeal- Honey Cup -- Pharyngeal -- Pharyngeal- Nectar Teaspoon -- Pharyngeal -- Pharyngeal- Nectar Cup Delayed swallow initiation-pyriform sinuses Pharyngeal -- Pharyngeal- Nectar Straw -- Pharyngeal -- Pharyngeal- Thin Teaspoon -- Pharyngeal -- Pharyngeal- Thin Cup Delayed swallow initiation-pyriform sinuses;Reduced  airway/laryngeal closure;Trace aspiration;Penetration/Aspiration during swallow Pharyngeal Material enters airway, passes BELOW cords and not ejected out despite cough attempt by patient Pharyngeal- Thin Straw -- Pharyngeal -- Pharyngeal- Puree WFL Pharyngeal -- Pharyngeal- Mechanical Soft -- Pharyngeal -- Pharyngeal- Regular WFL Pharyngeal -- Pharyngeal- Multi-consistency -- Pharyngeal -- Pharyngeal- Pill -- Pharyngeal -- Pharyngeal Comment --  No flowsheet data found. No flowsheet data found. Juan Quam Laurice 03/21/2016, 10:42 AM              Mr Jodene Nam Head/brain Wo Cm  Result Date: 03/21/2016 CLINICAL DATA:  Facial droop with slurred speech. EXAM: MRI HEAD WITHOUT CONTRAST MRA HEAD WITHOUT CONTRAST TECHNIQUE: Multiplanar, multiecho pulse sequences of the brain and surrounding structures were obtained without intravenous contrast. Angiographic images of the head  were obtained using MRA technique without contrast. COMPARISON:  Head CT 03/20/2016 FINDINGS: MRI HEAD FINDINGS Brain: There is a small acute infarct in the posterior limb of the left internal capsule. There is no evidence of intracranial hemorrhage, mass, midline shift, or extra-axial fluid collection. There is moderate cerebral atrophy. Chronic lacunar infarcts are present in the right lentiform nucleus, left thalamus, corpus callosum, and in the periventricular white matter bilaterally. Cerebral white matter T2 hyperintensities are compatible with extensive chronic small vessel ischemic disease. Mild chronic small vessel ischemic changes are present in the pons. Vascular: Major intracranial vascular flow voids are preserved. Skull and upper cervical spine: No suspicious osseous lesion identified. Prior anterior cervical fusion. Sinuses/Orbits: Prior bilateral cataract extraction. Trace left mastoid effusion. Clear paranasal sinuses. Other: None. MRA HEAD FINDINGS The visualized distal vertebral arteries are patent to the basilar with the right  being mildly dominant. There is mild irregularity an up to mild narrowing of both vertebral arteries. Right AICA appears dominant. PICAs are not clearly identified. SCA origins are patent. Basilar artery is patent without significant stenosis. The posterior communicating arteries are not identified. There are severe left P1 and and bilateral P2 stenoses with right greater than left distal PCA attenuation. The internal carotid arteries are patent from skullbase to carotid termini with mild right and moderate left supraclinoid segment stenoses. A1 and M1 segments are patent without evidence of significant stenosis. There are moderate proximal M2 stenoses bilaterally. There are also moderate to severe distal A2/ proximal A3 stenoses. No intracranial aneurysm is identified. IMPRESSION: 1. Acute left internal capsule infarct. 2. Extensive chronic small vessel ischemic disease with numerous chronic lacunar infarcts. 3. No large vessel occlusion. 4. Intracranial atherosclerosis as above including mild bilateral vertebral, severe bilateral PCA, mild right ICA, and moderate left ICA stenoses. Electronically Signed   By: Logan Bores M.D.   On: 03/21/2016 07:23     Scheduled Meds: . aspirin EC  81 mg Oral Daily  . atorvastatin  20 mg Oral q1800  . enoxaparin (LOVENOX) injection  40 mg Subcutaneous Q24H  . fluticasone furoate-vilanterol  1 puff Inhalation QAC breakfast  . insulin aspart  0-5 Units Subcutaneous QHS  . insulin aspart  0-9 Units Subcutaneous TID WC  . latanoprost  1 drop Both Eyes QHS  . predniSONE  60 mg Oral Q breakfast  . RESOURCE THICKENUP CLEAR   Oral Once   Continuous Infusions:   LOS: 1 day   Time Spent in minutes   30 minutes  Pama Roskos D.O. on 03/21/2016 at 12:40 PM  Between 7am to 7pm - Pager - [phone]  After 7pm go to [email or web address] - password [email or web address]  And look for the night coverage person covering for me after hours  [facility] Office   [phone]

## 2016-03-21 NOTE — Evaluation (Signed)
Physical Therapy Evaluation Patient Details Name: Susan Patel MRN: [id number] DOB: 07-04-1940 Today's Date: 03/21/2016   History of Present Illness  75 y.o. female with h/o HTN, DM, HLD, fibromyalgia. Pt presenting with confusion, stumbling, difficulty with word finding, and slurred speech. MRI on 11/5 positive for acute L internal capsule infarct.     Clinical Impression  Patient presents with problems listed below.  Will benefit from acute PT to maximize functional mobility prior to discharge.  Patient was independent pta.  Today patient requires assist for mobility and gait, with variable performance.  Patient with decreased cognition, and impulsive during mobility leading to decreased safety.  Recommend Inpatient Rehab consult for comprehensive therapies with goal to return home safely with husband.    Follow Up Recommendations CIR;Supervision/Assistance - 24 hour    Equipment Recommendations  Rolling walker with 5" wheels    Recommendations for Other Services Rehab consult     Precautions / Restrictions Precautions Precautions: Fall Precaution Comments: Impulsive Restrictions Weight Bearing Restrictions: No      Mobility  Bed Mobility Overal bed mobility: Needs Assistance Bed Mobility: Supine to Sit;Sit to Supine     Supine to sit: Min assist Sit to supine: Min guard   General bed mobility comments: Verbal cues to push trunk up from bed with UE's.  Assist to steady trunk during transition.  Once upright, patient with good balance.  Transfers Overall transfer level: Needs assistance Equipment used: Rolling walker (2 wheeled) Transfers: Sit to/from Stand Sit to Stand: Min assist         General transfer comment: Min assist to boost up from EOB and for balance in standing.  Ambulation/Gait Ambulation/Gait assistance: Min assist Ambulation Distance (Feet): 110 Feet Assistive device: Rolling walker (2 wheeled) Gait Pattern/deviations: Step-through  pattern;Decreased stride length;Drifts right/left     General Gait Details: Verbal cues (repeated) for safe use of RW.  Cues to move at safe speed.  Patient impulsive, letting go of RW with 1 hand and continuing to walk.  Drifting to both sides even with RW.  Distracted by people in hallway, requiring cues to attend to task.    Stairs            Wheelchair Mobility    Modified Rankin (Stroke Patients Only)       Balance Overall balance assessment: Needs assistance Sitting-balance support: Feet supported;No upper extremity supported Sitting balance-Leahy Scale: Fair     Standing balance support: Single extremity supported;During functional activity Standing balance-Leahy Scale: Poor Standing balance comment: RW for balance needed during mobility                             Pertinent Vitals/Pain Pain Assessment: No/denies pain    Home Living Family/patient expects to be discharged to:: Private residence Living Arrangements: Spouse/significant other Available Help at Discharge: Family;Available PRN/intermittently (Patient reports husband works and she is home alone. ) Type of Home: House Home Access: Spicer: Two level;Able to live on main level with bedroom/bathroom Home Equipment: Shower seat - built in;Grab bars - tub/shower;Cane - single point      Prior Function Level of Independence: Independent         Comments: Occasional use of cane for mobility.     Hand Dominance   Dominant Hand: Right    Extremity/Trunk Assessment   Upper Extremity Assessment: Defer to OT evaluation (Decreased coordination RUE noted)  Lower Extremity Assessment: Overall WFL for tasks assessed (Decreased heel-to-shin RLE)         Communication   Communication: Expressive difficulties  Cognition Arousal/Alertness: Awake/alert Behavior During Therapy: WFL for tasks assessed/performed Overall Cognitive Status:  Impaired/Different from baseline Area of Impairment: Safety/judgement;Problem solving         Safety/Judgement: Decreased awareness of safety;Decreased awareness of deficits   Problem Solving: Slow processing;Difficulty sequencing;Requires verbal cues General Comments: Patient impulsive, moving prior to equipment being readied.  Lets go of RW during gait to brush down her hair - continues walking.  Difficulty processing steps for turning with RW, backing up to bed, and sitting.  Cues to reach back for bed, but patient plopped onto bed in uncontrolled manner.    General Comments      Exercises     Assessment/Plan    PT Assessment Patient needs continued PT services  PT Problem List Decreased strength;Decreased balance;Decreased mobility;Decreased coordination;Decreased cognition;Decreased knowledge of use of DME;Decreased safety awareness          PT Treatment Interventions DME instruction;Gait training;Functional mobility training;Therapeutic activities;Balance training;Neuromuscular re-education;Cognitive remediation;Patient/family education    PT Goals (Current goals can be found in the Care Plan section)  Acute Rehab PT Goals Patient Stated Goal: return home PT Goal Formulation: With patient Time For Goal Achievement: 03/28/16 Potential to Achieve Goals: Good    Frequency Min 4X/week   Barriers to discharge Decreased caregiver support Unsure of assist available.  Reports to OT 24/7 assist available.  Reports to PT husband works and will be home alone part of the day.  Need to verify.    Co-evaluation               End of Session Equipment Utilized During Treatment: Gait belt Activity Tolerance: Patient tolerated treatment well Patient left: in bed;with call bell/phone within reach;with bed alarm set Nurse Communication: Mobility status         Time: 1719-1740 PT Time Calculation (min) (ACUTE ONLY): 21 min   Charges:   PT Evaluation $PT Eval Moderate  Complexity: 1 Procedure     PT G CodesDespina Pole 03-31-16, 6:13 PM Carita Pian. Sanjuana Kava, [place] Pager [phone]

## 2016-03-22 ENCOUNTER — Encounter (HOSPITAL_COMMUNITY): Payer: Self-pay | Admitting: Physical Medicine and Rehabilitation

## 2016-03-22 ENCOUNTER — Inpatient Hospital Stay (HOSPITAL_COMMUNITY): Payer: Medicare Other

## 2016-03-22 DIAGNOSIS — I6789 Other cerebrovascular disease: Secondary | ICD-10-CM

## 2016-03-22 DIAGNOSIS — M797 Fibromyalgia: Secondary | ICD-10-CM

## 2016-03-22 DIAGNOSIS — I10 Essential (primary) hypertension: Secondary | ICD-10-CM

## 2016-03-22 DIAGNOSIS — E1149 Type 2 diabetes mellitus with other diabetic neurological complication: Secondary | ICD-10-CM

## 2016-03-22 DIAGNOSIS — I69391 Dysphagia following cerebral infarction: Secondary | ICD-10-CM

## 2016-03-22 DIAGNOSIS — J449 Chronic obstructive pulmonary disease, unspecified: Secondary | ICD-10-CM

## 2016-03-22 DIAGNOSIS — E782 Mixed hyperlipidemia: Secondary | ICD-10-CM

## 2016-03-22 DIAGNOSIS — I639 Cerebral infarction, unspecified: Secondary | ICD-10-CM

## 2016-03-22 DIAGNOSIS — I69998 Other sequelae following unspecified cerebrovascular disease: Secondary | ICD-10-CM

## 2016-03-22 DIAGNOSIS — R7309 Other abnormal glucose: Secondary | ICD-10-CM

## 2016-03-22 DIAGNOSIS — I69322 Dysarthria following cerebral infarction: Secondary | ICD-10-CM

## 2016-03-22 DIAGNOSIS — I63312 Cerebral infarction due to thrombosis of left middle cerebral artery: Secondary | ICD-10-CM

## 2016-03-22 DIAGNOSIS — I6322 Cerebral infarction due to unspecified occlusion or stenosis of basilar arteries: Secondary | ICD-10-CM

## 2016-03-22 DIAGNOSIS — I69922 Dysarthria following unspecified cerebrovascular disease: Secondary | ICD-10-CM

## 2016-03-22 DIAGNOSIS — E119 Type 2 diabetes mellitus without complications: Secondary | ICD-10-CM

## 2016-03-22 DIAGNOSIS — I63311 Cerebral infarction due to thrombosis of right middle cerebral artery: Secondary | ICD-10-CM

## 2016-03-22 DIAGNOSIS — I6350 Cerebral infarction due to unspecified occlusion or stenosis of unspecified cerebral artery: Secondary | ICD-10-CM

## 2016-03-22 DIAGNOSIS — I69398 Other sequelae of cerebral infarction: Secondary | ICD-10-CM

## 2016-03-22 DIAGNOSIS — E1142 Type 2 diabetes mellitus with diabetic polyneuropathy: Secondary | ICD-10-CM

## 2016-03-22 DIAGNOSIS — I69991 Dysphagia following unspecified cerebrovascular disease: Secondary | ICD-10-CM

## 2016-03-22 DIAGNOSIS — R269 Unspecified abnormalities of gait and mobility: Secondary | ICD-10-CM

## 2016-03-22 DIAGNOSIS — D62 Acute posthemorrhagic anemia: Secondary | ICD-10-CM

## 2016-03-22 DIAGNOSIS — E1165 Type 2 diabetes mellitus with hyperglycemia: Secondary | ICD-10-CM

## 2016-03-22 DIAGNOSIS — N179 Acute kidney failure, unspecified: Secondary | ICD-10-CM

## 2016-03-22 DIAGNOSIS — G894 Chronic pain syndrome: Secondary | ICD-10-CM

## 2016-03-22 DIAGNOSIS — E669 Obesity, unspecified: Secondary | ICD-10-CM

## 2016-03-22 LAB — GLUCOSE, CAPILLARY
Glucose-Capillary: 105 mg/dL — ABNORMAL HIGH (ref 65–99)
Glucose-Capillary: 99 mg/dL (ref 65–99)
Glucose-Capillary: 198 mg/dL — ABNORMAL HIGH (ref 65–99)
Glucose-Capillary: 103 mg/dL — ABNORMAL HIGH (ref 65–99)

## 2016-03-22 LAB — ECHOCARDIOGRAM COMPLETE
HEIGHTINCHES: 61 in
Weight: 2880 oz

## 2016-03-22 LAB — VITAMIN B12: VITAMIN B 12: 776 pg/mL (ref 180–914)

## 2016-03-22 LAB — SEDIMENTATION RATE: SED RATE: 42 mm/h — AB (ref 0–22)

## 2016-03-22 LAB — C-REACTIVE PROTEIN: CRP: 0.9 mg/dL (ref <1.0)

## 2016-03-22 LAB — HEMOGLOBIN A1C
Hgb A1c MFr Bld: 6.6 % — ABNORMAL HIGH (ref 4.8–5.6)
Mean Plasma Glucose: 143 mg/dL

## 2016-03-22 LAB — TSH: TSH: 3.699 u[IU]/mL (ref 0.350–4.500)

## 2016-03-22 MED ORDER — ASPIRIN EC 325 MG PO TBEC
325.0000 mg | DELAYED_RELEASE_TABLET | Freq: Every day | ORAL | Status: DC
  Administered 2016-03-23: 325 mg via ORAL
  Filled 2016-03-22: qty 1

## 2016-03-22 NOTE — Progress Notes (Signed)
SLP Cancellation Note  Patient Details Name: Susan Patel MRN: [id number] DOB: 1941-04-05   Cancelled treatment:       Reason Eval/Treat Not Completed: Fatigue/lethargy limiting ability to participate. Pt politely refused, resting. Husband reports pt is tolerating modified diet as long as she takes small to moderate sized sips. Will f/u as able.    Arya Boxley, Katherene Ponto 03/22/2016, 10:38 AM

## 2016-03-22 NOTE — Progress Notes (Signed)
VASCULAR LAB PRELIMINARY  PRELIMINARY  PRELIMINARY  PRELIMINARY  Carotid duplex completed.    Preliminary report:  1-39% ICA plaquing.  Vertebral artery flow is antegrade.   Maysin Carstens, RVT 03/22/2016, 1:17 PM

## 2016-03-22 NOTE — Consult Note (Signed)
Physical Medicine and Rehabilitation Consult   Reason for Consult: Balance deficits, dysarthria, dysphagia and expressive deficits Referring Physician: Dr. Ree Kida    HPI: Susan Patel is a 75 y.o. female with history of T2DM with neuropathy, HTN, fibromyalgia--chronic pain, COPD--oxygen at nights, right visual deficits (negative work up) who was admitted on 03/20/16 with confusion, left facial droop with slurred speech and word finding deficits as well as difficulty walking. Last seen normal the night before going to bed. 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 Neurology consulted and recommended resuming ASA as patient not compliant with ASA. Work up in progress.    Speech therapy evaluation with moderate dysarthria with mild word finding deficits and mild pharyngeal dysphagia with trace aspiration of thins--placed on dysphagia 3, nectar liquids. Patient with resultant, balance deficits, visual deficits, poor safety awareness with impulsivity affecting safety with mobility. PT/OT/ST evaluations done yesterday and CIR recommended for follow up therapy.   PTA: Sedentary but ambulated without AD. Has not driven lately and goes out of house once a week.  Husband retired and assists with home management and meals.   Review of Systems  HENT: Negative for hearing loss and tinnitus.   Eyes: Positive for blurred vision (right eye).  Respiratory: Negative for cough and shortness of breath.   Cardiovascular: Negative for chest pain and palpitations.  Gastrointestinal: Negative for abdominal pain, heartburn and nausea.  Genitourinary: Negative for urgency.  Musculoskeletal: Positive for back pain, falls, joint pain, myalgias and neck pain.  Skin: Negative for itching and rash.  Neurological: Positive for speech change. Negative for dizziness and headaches.  Psychiatric/Behavioral: The patient is not  nervous/anxious and does not have insomnia.   All other systems reviewed and are negative.     Past Medical History:  Diagnosis Date  . Arthritis    'all over"  . Asthma   . COPD (chronic obstructive pulmonary disease) (HCC)    O2 per [place] at nights   . Depression   . Diabetes mellitus without complication ([place])    borderline- , states she was on med., but MD told her "everything is under control so I threw the bottle away"  . Fibromyalgia   . GERD (gastroesophageal reflux disease)   . Hypertension   . Neuromuscular disorder (HCC)    parkinson, neuropathy- both feet & hands.    Past Surgical History:  Procedure Laterality Date  . ABDOMINAL HYSTERECTOMY    . ANTERIOR CERVICAL DECOMP/DISCECTOMY FUSION N/A 07/10/2015   Procedure: Cervical five-six, Cervical six-seven anterior cervical decompression with fusion plating and bonegraft;  Surgeon: Jovita Gamma, MD;  Location: [facility];  Service: Neurosurgery;  Laterality: N/A;  . APPENDECTOMY    . BIOPSY EYE MUSCLE  03/20/2016   biopsy vessel to right eye due to swelling  . EYE SURGERY Bilateral    cataracts removed, /w "cyrstal lenses"   . SHOULDER ARTHROSCOPY Right    x2   RCR- spurs removed     Family History  Problem Relation Age of Onset  . Heart disease Mother   . Cancer Father   . 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 Retired Pharmacist, hospital. She reports that she has never smoked. She has never used smokeless tobacco. She reports that she does not drink alcohol or use drugs.   Allergies  Allergen Reactions  . Azithromycin Other (See Comments)  Renal failure  . Sulfamethoxazole-Trimethoprim Other (See Comments)    Renal failure  . Prednisone Other (See Comments)    Irritability and insomnia  . Ciprofloxacin Itching and Rash  . Tape Rash    Medications Prior to Admission  Medication Sig Dispense Refill  . acetaminophen (TYLENOL) 500 MG tablet Take 1,000 mg by mouth every 8  (eight) hours as needed for mild pain or moderate pain.    Marland Kitchen albuterol (PROVENTIL HFA;VENTOLIN HFA) 108 (90 Base) MCG/ACT inhaler Inhale 1-2 puffs into the lungs every 6 (six) hours as needed for wheezing or shortness of breath. 1 Inhaler 1  . aspirin EC 81 MG tablet Take 1 tablet (81 mg total) by mouth daily. 90 tablet 1  . cetirizine (ZYRTEC) 10 MG tablet Take 10 mg by mouth daily.    . cyclobenzaprine (FLEXERIL) 5 MG tablet TAKE 1 TABLET (5 MG TOTAL) BY MOUTH 3 (THREE) TIMES DAILY AS NEEDED FOR MUSCLE SPASMS. 60 tablet 3  . DULoxetine (CYMBALTA) 60 MG capsule Take 60 mg by mouth daily.     . Esomeprazole Magnesium (NEXIUM PO) Take 1 capsule by mouth daily.    . fluticasone (FLONASE) 50 MCG/ACT nasal spray Place 2 sprays into both nostrils daily. (Patient taking differently: Place 2 sprays into both nostrils daily as needed for allergies. ) 54 g 0  . fluticasone furoate-vilanterol (BREO ELLIPTA) 200-25 MCG/INH AEPB Inhale 1 puff into the lungs daily.    Marland Kitchen glimepiride (AMARYL) 2 MG tablet Take 2 mg by mouth daily with breakfast.     . HYDROcodone-acetaminophen (NORCO/VICODIN) 5-325 MG tablet Take 1 tablet by mouth every 12 (twelve) hours as needed for moderate pain. 60 tablet 0  . latanoprost (XALATAN) 0.005 % ophthalmic solution Place 1 drop into both eyes once a week.  3  . losartan (COZAAR) 100 MG tablet Take 1 tablet (100 mg total) by mouth daily. 90 tablet 3  . mirabegron ER (MYRBETRIQ) 50 MG TB24 tablet Take 50 mg by mouth daily.    . montelukast (SINGULAIR) 10 MG tablet Take 10 mg by mouth at bedtime.   3  . OXYGEN Inhale 3-4 L into the lungs at bedtime.    . polyethylene glycol powder (GLYCOLAX/MIRALAX) powder TAKE 1 CAPFUL IN 8 OUNCES OF WATER JUICE OR TEA AND DRINK DAILY (Patient taking differently: TAKE 1 CAPFUL IN 8 OUNCES OF WATER JUICE OR TEA AND DRINK DAILY AS NEEDED FOR CONSTIPATION) 255 g 5  . traZODone (DESYREL) 50 MG tablet TAKE 1 TABLET (50 MG TOTAL) BY MOUTH NIGHTLY AS NEEDED  FOR UP TO 30 DAYS FOR SLEEP. (Patient taking differently: TAKE 1 TABLET (50 MG TOTAL) BY MOUTH DAILY AT BEDTIME) 90 tablet 2  . Vitamin D, Ergocalciferol, (DRISDOL) 50000 units CAPS capsule Take 50,000 Units by mouth every Monday.   3  . blood glucose meter kit and supplies KIT Dispense based on patient and insurance preference. Use up to two times daily as directed (DX: E11.9  type 2 diabetes, uncontrolled) 1 each 0  . HYDROcodone-homatropine (HYCODAN) 5-1.5 MG/5ML syrup Take 2.5-5 mLs by mouth every 8 (eight) hours as needed for cough. (Patient not taking: Reported on 03/21/2016) 75 mL 0  . ONETOUCH VERIO test strip TEST TWICE DAILY 100 each 2  . pantoprazole (PROTONIX) 40 MG tablet Take 1 tablet (40 mg total) by mouth daily. (Patient not taking: Reported on 03/21/2016) 90 tablet 0  . rosuvastatin (CRESTOR) 40 MG tablet Take 1 tablet (40 mg total) by mouth daily. (Patient  not taking: Reported on 03/21/2016) 90 tablet 1    Home: [place] expects to be discharged to:: Private residence Living Arrangements: Spouse/significant other Available Help at Discharge: Family, Available PRN/intermittently (Patient reports husband works and she is home alone. ) Type of Home: House Home Access: Ramped entrance [place]: Two level, Able to live on main level with bedroom/bathroom Bathroom Shower/Tub: Other (comment) (walk in tub) Bathroom Toilet: Garden: [place] in, [employer] - tub/shower, Radio producer - single point  Lives With: Spouse  Functional History: Prior Function Level of Independence: Independent Comments: Occasional use of cane for mobility. Functional Status:  Mobility: Bed Mobility Overal bed mobility: Needs Assistance Bed Mobility: Supine to Sit, Sit to Supine Supine to sit: Min assist Sit to supine: Min guard General bed mobility comments: Verbal cues to push trunk up from bed with UE's.  Assist to steady trunk during transition.  Once upright,  patient with good balance. Transfers Overall transfer level: Needs assistance Equipment used: Rolling walker (2 wheeled) Transfers: Sit to/from Stand Sit to Stand: Min assist General transfer comment: Min assist to boost up from EOB and for balance in standing. Ambulation/Gait Ambulation/Gait assistance: Min assist Ambulation Distance (Feet): 110 Feet Assistive device: Rolling walker (2 wheeled) Gait Pattern/deviations: Step-through pattern, Decreased stride length, Drifts right/left General Gait Details: Verbal cues (repeated) for safe use of RW.  Cues to move at safe speed.  Patient impulsive, letting go of RW with 1 hand and continuing to walk.  Drifting to both sides even with RW.  Distracted by people in hallway, requiring cues to attend to task.      ADL: ADL Overall ADL's : Needs assistance/impaired Grooming: Min guard, Sitting Upper Body Bathing: Min guard, Sitting Lower Body Bathing: Minimal assistance, Sit to/from stand Upper Body Dressing : Min guard, Sitting Lower Body Dressing: Minimal assistance, Sit to/from stand Lower Body Dressing Details (indicate cue type and reason): Pt able to don pants and pull up in standing but required min assist to steady due to balance deficits. Toilet Transfer: Moderate assistance, Ambulation, BSC (hand held assist) Toilet Transfer Details (indicate cue type and reason): Simulated by sit to stand from EOB with functional mobility in room. Functional mobility during ADLs: Moderate assistance General ADL Comments: Pt with difficutly with word finding and noted to have difficulty managing secretions; coughing frequently.   Cognition: Cognition Overall Cognitive Status: Impaired/Different from baseline Arousal/Alertness: Awake/alert Orientation Level: Oriented X4 Cognition Arousal/Alertness: Awake/alert Behavior During Therapy: WFL for tasks assessed/performed Overall Cognitive Status: Impaired/Different from baseline Area of Impairment:  Safety/judgement, Problem solving Safety/Judgement: Decreased awareness of safety, Decreased awareness of deficits Problem Solving: Slow processing, Difficulty sequencing, Requires verbal cues General Comments: Patient impulsive, moving prior to equipment being readied.  Lets go of RW during gait to brush down her hair - continues walking.  Difficulty processing steps for turning with RW, backing up to bed, and sitting.  Cues to reach back for bed, but patient plopped onto bed in uncontrolled manner.   Blood pressure (!) 180/55, pulse 64, temperature 97.7 F (36.5 C), temperature source Oral, resp. rate 20, height '5\' 1"'  (1.549 m), weight 81.6 kg (180 lb), SpO2 100 %. Physical Exam  Nursing note and vitals reviewed. Constitutional: She is oriented to person, place, and time. She appears well-developed and well-nourished.  HENT:  Head: Normocephalic and atraumatic.  Mouth/Throat: Oropharynx is clear and moist.  Eyes: Conjunctivae and EOM are normal. Pupils are equal, round, and reactive to light.  Neck: Normal range of motion. Neck supple.  Cardiovascular: Normal rate and regular rhythm.   Respiratory: Breath sounds normal. Stridor present. No respiratory distress. She has no wheezes.  GI: Soft. Bowel sounds are normal. She exhibits no distension. There is no tenderness.  Musculoskeletal: She exhibits no edema or tenderness.  Neurological: She is alert and oriented to person, place, and time.  Moderate dysarthria with slow speech.  Able to follow basic commands without difficulty.  Motor: B/l UE 4/5 proximal to distal RLE: HF 4/5, KE 4/5, ADF/PF 4+/5 LLE: HF 4+/5, KE 4+/5, ADF/PF 4+/5 Sensation diminished to light touch RLE   Skin: Skin is warm and dry.  Psychiatric: Her affect is blunt. Her speech is delayed. She is slowed.    Results for orders placed or performed during the hospital encounter of 03/20/16 (from the past 24 hour(s))  Glucose, capillary     Status: Abnormal    Collection Time: 03/21/16 12:37 PM  Result Value Ref Range   Glucose-Capillary 127 (H) 65 - 99 mg/dL  Glucose, capillary     Status: Abnormal   Collection Time: 03/21/16  6:03 PM  Result Value Ref Range   Glucose-Capillary 141 (H) 65 - 99 mg/dL  Glucose, capillary     Status: Abnormal   Collection Time: 03/21/16  9:40 PM  Result Value Ref Range   Glucose-Capillary 177 (H) 65 - 99 mg/dL  TSH     Status: None   Collection Time: 03/22/16  6:31 AM  Result Value Ref Range   TSH 3.699 0.350 - 4.500 uIU/mL  Vitamin B12     Status: None   Collection Time: 03/22/16  6:31 AM  Result Value Ref Range   Vitamin B-12 776 180 - 914 pg/mL  Sedimentation rate     Status: Abnormal   Collection Time: 03/22/16  6:31 AM  Result Value Ref Range   Sed Rate 42 (H) 0 - 22 mm/hr  C-reactive protein     Status: None   Collection Time: 03/22/16  6:31 AM  Result Value Ref Range   CRP 0.9 <1.0 mg/dL  Glucose, capillary     Status: Abnormal   Collection Time: 03/22/16  8:25 AM  Result Value Ref Range   Glucose-Capillary 105 (H) 65 - 99 mg/dL   Ct Head Wo Contrast  Result Date: 03/20/2016 CLINICAL DATA:  Left-sided facial droop with slurred speech, initial encounter EXAM: CT HEAD WITHOUT CONTRAST TECHNIQUE: Contiguous axial images were obtained from the base of the skull through the vertex without intravenous contrast. COMPARISON:  None. FINDINGS: Brain: No evidence of acute infarction, hemorrhage, hydrocephalus, extra-axial collection or mass lesion/mass effect. Atrophic and chronic white matter ischemic changes are noted. Vascular: No hyperdense vessel or unexpected calcification. Skull: Normal. Negative for fracture or focal lesion. Sinuses/Orbits: No acute finding. Other: None. IMPRESSION: Chronic atrophic and ischemic changes without acute infarct. Electronically Signed   By: Inez Catalina M.D.   On: 03/20/2016 13:22   Mr Brain Wo Contrast  Result Date: 03/21/2016 CLINICAL DATA:  Facial droop with slurred  speech. EXAM: MRI HEAD WITHOUT CONTRAST MRA HEAD WITHOUT CONTRAST TECHNIQUE: Multiplanar, multiecho pulse sequences of the brain and surrounding structures were obtained without intravenous contrast. Angiographic images of the head were obtained using MRA technique without contrast. COMPARISON:  Head CT 03/20/2016 FINDINGS: MRI HEAD FINDINGS Brain: There is a small acute infarct in the posterior limb of the left internal capsule. There is no evidence of intracranial hemorrhage, mass, midline shift, or extra-axial fluid  collection. There is moderate cerebral atrophy. Chronic lacunar infarcts are present in the right lentiform nucleus, left thalamus, corpus callosum, and in the periventricular white matter bilaterally. Cerebral white matter T2 hyperintensities are compatible with extensive chronic small vessel ischemic disease. Mild chronic small vessel ischemic changes are present in the pons. Vascular: Major intracranial vascular flow voids are preserved. Skull and upper cervical spine: No suspicious osseous lesion identified. Prior anterior cervical fusion. Sinuses/Orbits: Prior bilateral cataract extraction. Trace left mastoid effusion. Clear paranasal sinuses. Other: None. MRA HEAD FINDINGS The visualized distal vertebral arteries are patent to the basilar with the right being mildly dominant. There is mild irregularity an up to mild narrowing of both vertebral arteries. Right AICA appears dominant. PICAs are not clearly identified. SCA origins are patent. Basilar artery is patent without significant stenosis. The posterior communicating arteries are not identified. There are severe left P1 and and bilateral P2 stenoses with right greater than left distal PCA attenuation. The internal carotid arteries are patent from skullbase to carotid termini with mild right and moderate left supraclinoid segment stenoses. A1 and M1 segments are patent without evidence of significant stenosis. There are moderate proximal M2  stenoses bilaterally. There are also moderate to severe distal A2/ proximal A3 stenoses. No intracranial aneurysm is identified. IMPRESSION: 1. Acute left internal capsule infarct. 2. Extensive chronic small vessel ischemic disease with numerous chronic lacunar infarcts. 3. No large vessel occlusion. 4. Intracranial atherosclerosis as above including mild bilateral vertebral, severe bilateral PCA, mild right ICA, and moderate left ICA stenoses. Electronically Signed   By: Logan Bores M.D.   On: 03/21/2016 07:23                 Mr Jodene Nam Head/brain BS Cm  Result Date: 03/21/2016 CLINICAL DATA:  Facial droop with slurred speech. EXAM: MRI HEAD WITHOUT CONTRAST MRA HEAD WITHOUT CONTRAST TECHNIQUE: Multiplanar, multiecho pulse sequences of the brain and surrounding structures were obtained without intravenous contrast. Angiographic images of the head were obtained using MRA technique without contrast. COMPARISON:  Head CT 03/20/2016 FINDINGS: MRI HEAD FINDINGS Brain: There is a small acute infarct in the posterior limb of the left internal capsule. There is no evidence of intracranial hemorrhage, mass, midline shift, or extra-axial fluid collection. There is moderate cerebral atrophy. Chronic lacunar infarcts are present in the right lentiform nucleus, left thalamus, corpus callosum, and in the periventricular white matter bilaterally. Cerebral white matter T2 hyperintensities are compatible with extensive chronic small vessel ischemic disease. Mild chronic small vessel ischemic changes are present in the pons. Vascular: Major intracranial vascular flow voids are preserved. Skull and upper cervical spine: No suspicious osseous lesion identified. Prior anterior cervical fusion. Sinuses/Orbits: Prior bilateral cataract extraction. Trace left mastoid effusion. Clear paranasal sinuses. Other: None. MRA HEAD FINDINGS The visualized distal vertebral arteries are patent to the basilar with the right being mildly dominant.  There is mild irregularity an up to mild narrowing of both vertebral arteries. Right AICA appears dominant. PICAs are not clearly identified. SCA origins are patent. Basilar artery is patent without significant stenosis. The posterior communicating arteries are not identified. There are severe left P1 and and bilateral P2 stenoses with right greater than left distal PCA attenuation. The internal carotid arteries are patent from skullbase to carotid termini with mild right and moderate left supraclinoid segment stenoses. A1 and M1 segments are patent without evidence of significant stenosis. There are moderate proximal M2 stenoses bilaterally. There are also moderate to severe distal A2/ proximal A3  stenoses. No intracranial aneurysm is identified. IMPRESSION: 1. Acute left internal capsule infarct. 2. Extensive chronic small vessel ischemic disease with numerous chronic lacunar infarcts. 3. No large vessel occlusion. 4. Intracranial atherosclerosis as above including mild bilateral vertebral, severe bilateral PCA, mild right ICA, and moderate left ICA stenoses. Electronically Signed   By: Logan Bores M.D.   On: 03/21/2016 07:23    Assessment/Plan: Diagnosis: acute left internal capsule infarct Labs and images independently reviewed.  Records reviewed and summated above. Stroke: Continue secondary stroke prophylaxis and Risk Factor Modification listed below:   Antiplatelet therapy:   Blood Pressure Management:  Continue current medication with prn's with permisive HTN per primary team Statin Agent:   Diabetes management:   Right sided hemiparesis Motor recovery: Fluoxetine  1. Does the need for close, 24 hr/day medical supervision in concert with the patient's rehab needs make it unreasonable for this patient to be served in a less intensive setting? Yes  2. Co-Morbidities requiring supervision/potential complications: [id number] with neuropathy (Monitor in accordance with exercise and adjust meds as  necessary), HTN (monitor and provide prns in accordance with increased physical exertion and pain), fibromyalgia--chronic pain (Biofeedback training with therapies to help reduce reliance on opiate pain medications, monitor pain control during therapies, and sedation at rest and titrate to maximum efficacy to ensure participation and gains in therapies), COPD (supplemental O2 qhs, monitor RR and O2 sats with increased activity), right visual deficits (negative work up), medication non compliance (educate), balance deficits, dysarthria, dysphagia (SLP, advance diet as tolerated), expressive deficits, obesity (Body mass index is 34.01 kg/m.,  diet and exercise education, encourage weight loss to increase endurance and promote overall health), AKI (avoid nephrotoxic meds), ABLA (transfuse if necessary to ensure appropriate perfusion for increased activity tolerance), ?temporal arteritis (cont workup, treat if necessary) 3. Due to bladder management, safety, disease management, medication administration and patient education, does the patient require 24 hr/day rehab nursing? Yes 4. Does the patient require coordinated care of a physician, rehab nurse, PT (1-2 hrs/day, 5 days/week), OT (1-2 hrs/day, 5 days/week) and SLP (1-2 hrs/day, 5 days/week) to address physical and functional deficits in the context of the above medical diagnosis(es)? Yes Addressing deficits in the following areas: balance, endurance, locomotion, strength, transferring, bathing, dressing, toileting, cognition, speech, swallowing and psychosocial support 5. Can the patient actively participate in an intensive therapy program of at least 3 hrs of therapy per day at least 5 days per week? Yes 6. The potential for patient to make measurable gains while on inpatient rehab is excellent 7. Anticipated functional outcomes upon discharge from inpatient rehab are modified independent and supervision  with PT, modified independent and supervision with  OT, independent and modified independent with SLP. 8. Estimated rehab length of stay to reach the above functional goals is: 10-14 days. 9. Does the patient have adequate social supports and living environment to accommodate these discharge functional goals? Yes 10. Anticipated D/C setting: Home 11. Anticipated post D/C treatments: HH therapy and Home excercise program 12. Overall Rehab/Functional Prognosis: good  RECOMMENDATIONS: This patient's condition is appropriate for continued rehabilitative care in the following setting: CIR after completion of medical workup Patient has agreed to participate in recommended program. Yes Note that insurance prior authorization may be required for reimbursement for recommended care.  Comment: Rehab Admissions Coordinator to follow up.  Delice Lesch, MD, Mellody Drown 03/22/2016

## 2016-03-22 NOTE — Progress Notes (Signed)
Physical Therapy Treatment Patient Details Name: Susan Patel MRN: [id number] DOB: 03-27-1941 Today's Date: 03/22/2016    History of Present Illness 75 y.o. female with h/o HTN, DM, HLD, fibromyalgia. Pt presenting with confusion, stumbling, difficulty with word finding, and slurred speech. MRI on 11/5 positive for acute L internal capsule infarct.     PT Comments    Noted patient with multiple issues attempting to use RW on 11/5, therefore attempted gait training without a device. Patient with 4 losses of balance requiring min assist to recover with no awareness of staggering/stumbling to her right. Very distracted walking in hallway (could not even attend to simple instructions "stop here"). Noted perseveration when brushing her teeth using Rt hand (standing at sink working on static balance and attending to task).    Follow Up Recommendations  CIR;Supervision/Assistance - 24 hour     Equipment Recommendations  Other (comment) (TBD)    Recommendations for Other Services       Precautions / Restrictions Precautions Precautions: Fall Precaution Comments: Impulsive Restrictions Weight Bearing Restrictions: No    Mobility  Bed Mobility Overal bed mobility: Needs Assistance Bed Mobility: Supine to Sit;Sit to Supine     Supine to sit: Min assist     General bed mobility comments: cuing for less taxing technique (pt pulling up to sit directly from supine); she was unable to attend to instructions and continued struggling to come to sitting at EOB, reaching for PT to pull her to sitting and then falling back towards mattress  Transfers Overall transfer level: Needs assistance Equipment used: 1 person hand held assist;None Transfers: Sit to/from Stand Sit to Stand: Min assist         General transfer comment: Min assist to boost up from EOB and for balance in standing.; pt leaning excessively forward as coming to stand; improved on second and third  trials  Ambulation/Gait Ambulation/Gait assistance: Min assist Ambulation Distance (Feet): 110 Feet Assistive device: None Gait Pattern/deviations: Step-through pattern;Decreased stride length;Staggering right;Drifts right/left   Gait velocity interpretation: Below normal speed for age/gender General Gait Details: Attempted without RW as she appeared to have difficulty using properly 11/5. She became very distracted in hallway with multiple losses of balance to her right.    Stairs            Wheelchair Mobility    Modified Rankin (Stroke Patients Only) Modified Rankin (Stroke Patients Only) Pre-Morbid Rankin Score: No symptoms Modified Rankin: Moderately severe disability     Balance Overall balance assessment: Needs assistance         Standing balance support: No upper extremity supported Standing balance-Leahy Scale: Poor Standing balance comment: requires assist to achieve balanced/midline staning     Tandem Stance - Right Leg: 0 (could not achieve even modified position without LOB) Tandem Stance - Left Leg: 0 Rhomberg - Eyes Opened: 0 (required assist to obtain position due to LOB) Rhomberg - Eyes Closed: 0 (minguard with incr sway & step to right) High level balance activites: Direction changes;Turns;Sudden stops;Head turns High Level Balance Comments: difficulty following challenges/instructions due to attention    Cognition Arousal/Alertness: Awake/alert Behavior During Therapy: Flat affect Overall Cognitive Status: Impaired/Different from baseline Area of Impairment: Safety/judgement;Attention;Following commands   Current Attention Level: Sustained (difficulty focusing in distracting environment)   Following Commands: Follows one step commands inconsistently (especially in busier environment) Safety/Judgement: Decreased awareness of safety;Decreased awareness of deficits   Problem Solving: Slow processing;Difficulty sequencing;Requires verbal  cues;Requires tactile cues General Comments: Patient perseverating on  brushing a particular tooth; couldn't attend to PT instructions while walking in hall (required tactile cues to redirect); stafggering LOB x 4 in hall and when asked if she lost her balance while walking "no"    Exercises      General Comments General comments (skin integrity, edema, etc.): husband present      Pertinent Vitals/Pain Pain Assessment: No/denies pain    Home Living                      Prior Function            PT Goals (current goals can now be found in the care plan section) Acute Rehab PT Goals Patient Stated Goal: return home Time For Goal Achievement: 03/28/16 Progress towards PT goals: Progressing toward goals    Frequency    Min 4X/week      PT Plan Current plan remains appropriate    Co-evaluation             End of Session Equipment Utilized During Treatment: Gait belt Activity Tolerance: Patient tolerated treatment well Patient left: with call bell/phone within reach;in chair;with family/visitor present (husband states she will not get up)     Time: [id number] PT Time Calculation (min) (ACUTE ONLY): 30 min  Charges:  $Gait Training: 8-22 mins $Therapeutic Activity: 8-22 mins                    G Codes:      Mykal Batiz 30-Mar-2016, 5:18 PM Pager [phone]

## 2016-03-22 NOTE — Progress Notes (Signed)
Initial Nutrition Assessment  DOCUMENTATION CODES:   Obesity unspecified  INTERVENTION:   -Magic Cup TID with meals  NUTRITION DIAGNOSIS:   Inadequate oral intake related to dysphagia as evidenced by meal completion < 50%.  GOAL:   Patient will meet greater than or equal to 90% of their needs  MONITOR:   PO intake, Supplement acceptance, Diet advancement, Labs, Weight trends, Skin, I & O's  REASON FOR ASSESSMENT:   Malnutrition Screening Tool    ASSESSMENT:   Susan Patel is a 75 y.o. female with h/o HTN, DM, HLD, fibromyalgia. Yesterday she had "a surgery to a blood vessel" in her right temple. Husband says this was done because she was having vision problems. Surgery was done at the urging of a retinal specialist. I wonder if there was a concern for GCA (temporal arteritis) altho she was never started on steroids.   Pt admitted with CVA.   Hx obtained mainly from pt husband at bedside. He reports that pt generally had a good appetite PTA. Pt consumed 2-3 meals per day; bacon and pork chops were favorite foods. Pt is tolerating dysphagia 3 diet with nectar thick liquid diet well, however, pt is not eating as much as she is used to due to not liking the thickened liquids. Pt husband is eager for diet advancement. Discussed rationale for diet; pt acknowledges that pt is working with SLP for potential diet advancement.   Pt shares that wt loss has been intentional. Per pt husband, pt was on "a lot of medications" and feels like multiple medications may have been the cause for weight loss (medications were recently revised and simplified). Per wt hx, UBW is around 200# with an 8.6% wt loss over the past 6 months (ewhich is significant for time frame).   Nutrition-Focused physical exam completed. Findings are mild fat depletion, no muscle depletion, and mild edema.   Discussed importance of good meal completion for healing. Also educated on rationale for current diet order and  importance of compliance prior to diet advancement.   Labs reviewed: CBGS: 99-105.    Diet Order:  DIET DYS 3 Room service appropriate? Yes; Fluid consistency: Nectar Thick  Skin:  Reviewed, no issues  Last BM:  03/19/16  Height:   Ht Readings from Last 1 Encounters:  03/20/16 5\' 1"  (1.549 m)    Weight:   Wt Readings from Last 1 Encounters:  03/20/16 180 lb (81.6 kg)    Ideal Body Weight:  47.7 kg  BMI:  Body mass index is 34.01 kg/m.  Estimated Nutritional Needs:   Kcal:  1600-1800  Protein:  80-95 grams  Fluid:  1.6-1.8 L  EDUCATION NEEDS:   Education needs addressed  Reylene Stauder A. Jimmye Norman, RD, LDN, CDE Pager: [phone] After hours Pager: [phone]

## 2016-03-22 NOTE — Care Management Note (Signed)
Case Management Note  Patient Details  Name: Susan Patel MRN: [id number] Date of Birth: 02-Jun-1940  Subjective/Objective:                 Patient admitted with CVA. From home with spouse. PT rec CIR, stroke workup pending.    Action/Plan:  CM will continue to follow for DC planning. Expected Discharge Date:                  Expected Discharge Plan:  IP Rehab Facility  In-House Referral:  Clinical Social Work  Discharge planning Services  CM Consult  Post Acute Care Choice:    Choice offered to:     DME Arranged:    DME Agency:     HH Arranged:    [place] Agency:     Status of Service:  In process, will continue to follow  If discussed at Long Length of Stay Meetings, dates discussed:    Additional Comments:  Carles Collet, RN 03/22/2016, 4:06 PM

## 2016-03-22 NOTE — Progress Notes (Signed)
STROKE TEAM PROGRESS NOTE   HISTORY OF PRESENT ILLNESS (per record) Very dysarthric. Failed swallowing study.  She denies any severe headaches or loss of vision to prompt temporal artery biopsy. She had some non-specific visual changes in the right eye described as cobwebs or crystals. Extensive retinal and optic evaluations were normal per husband. She was not taking ASA daily and faithfully when this happened. Patient was not administered IV t-PA.    SUBJECTIVE (INTERVAL HISTORY) No family is at bedside. Pt lying in bed, mildly lethargic. Able to answer all questions but slow and mild dysarthria. She stated that her vision disturbance was seeing web like picture but no vision loss. Temporal artery biopsy report pending, but less likely according to the symptoms. Will d/c prednisone.    OBJECTIVE Temp:  [97.3 F (36.3 C)-98.7 F (37.1 C)] 97.7 F (36.5 C) (11/06 0637) Pulse Rate:  [64-78] 78 (11/06 1011) Cardiac Rhythm: Normal sinus rhythm (11/05 2100) Resp:  [16-20] 18 (11/06 1011) BP: (137-180)/(55-98) 160/57 (11/06 1011) SpO2:  [96 %-100 %] 97 % (11/06 1011)  CBC:  Recent Labs Lab 03/20/16 1243 03/20/16 2010  WBC 6.4 8.1  NEUTROABS 3.9  --   HGB 11.5* 12.7  HCT 35.5* 38.2  MCV 87.4 85.1  PLT 351 0000000    Basic Metabolic Panel:  Recent Labs Lab 03/20/16 1243 03/20/16 2010  NA 139  --   K 4.3  --   CL 105  --   CO2 29  --   GLUCOSE 130*  --   BUN 17  --   CREATININE 1.06* 0.89  CALCIUM 8.8*  --     Lipid Panel:    Component Value Date/Time   CHOL 263 (H) 03/21/2016 0517   CHOL 290 (H) 10/23/2015 1725   TRIG 138 03/21/2016 0517   HDL 46 03/21/2016 0517   HDL 45 10/23/2015 1725   CHOLHDL 5.7 03/21/2016 0517   VLDL 28 03/21/2016 0517   LDLCALC 189 (H) 03/21/2016 0517   LDLCALC 208 (H) 10/23/2015 1725   HgbA1c:  Lab Results  Component Value Date   HGBA1C 6.6 (H) 03/21/2016   Urine Drug Screen:    Component Value Date/Time   LABOPIA POSITIVE (A)  03/20/2016 1350   COCAINSCRNUR NONE DETECTED 03/20/2016 1350   LABBENZ POSITIVE (A) 03/20/2016 1350   AMPHETMU NONE DETECTED 03/20/2016 1350   THCU NONE DETECTED 03/20/2016 1350   LABBARB NONE DETECTED 03/20/2016 1350      IMAGING I have personally reviewed the radiological images below and agree with the radiology interpretations.  Ct Head Wo Contrast 03/20/2016 Chronic atrophic and ischemic changes without acute infarct.   Mr Brain Wo Contrast Mr Jodene Nam Head/brain Wo Cm 03/21/2016  1. Acute left internal capsule infarct. 2. Extensive chronic small vessel ischemic disease with numerous chronic lacunar infarcts. 3. No large vessel occlusion. 4. Intracranial atherosclerosis as above including mild bilateral vertebral, severe bilateral PCA, mild right ICA, and moderate left ICA stenoses.   CUS - Bilateral: 1-39% ICA stenosis. Vertebral artery flow is antegrade.  TTE - Procedure narrative: Transthoracic echocardiography. Image   quality was adequate. The study was technically difficult. - Left ventricle: The cavity size was normal. There was moderate   focal basal hypertrophy of the septum. Systolic function was   normal. The estimated ejection fraction was in the range of 50%   to 55%. Wall motion was normal; there were no regional wall   motion abnormalities. Doppler parameters are consistent with   abnormal left  ventricular relaxation (grade 1 diastolic   dysfunction). - Mitral valve: There was trivial regurgitation. Impressions: - No cardiac source of emboli was indentified.   PHYSICAL EXAM  Temp:  [97.7 F (36.5 C)-98.7 F (37.1 C)] 98.4 F (36.9 C) (11/06 2200) Pulse Rate:  [64-78] 78 (11/06 2200) Resp:  [16-20] 16 (11/06 2200) BP: (157-180)/(55-98) 160/59 (11/06 2200) SpO2:  [96 %-100 %] 100 % (11/06 2200)  General - Well nourished, well developed, in no apparent distress, mildly lethargic.  Ophthalmologic - Fundi not visualized due to  noncooperation.  Cardiovascular - Regular rate and rhythm.  Mental Status -  Level of arousal and orientation to time, place, and person were intact. Language including expression, naming, repetition, comprehension was assessed and found intact, however, bradyphasia.  Cranial Nerves II - XII - II - Visual field intact OU. III, IV, VI - Extraocular movements intact. V - Facial sensation intact bilaterally. VII - right facial droop. VIII - Hearing & vestibular intact bilaterally. X - Palate elevates symmetrically, mild dysarthria. XI - Chin turning & shoulder shrug intact bilaterally. XII - Tongue protrusion intact.  Motor Strength - The patient's strength was 4/5 in all extremities and pronator drift was absent.  Bulk was normal and fasciculations were absent.   Motor Tone - Muscle tone was assessed at the neck and appendages and was normal.  Reflexes - The patient's reflexes were 1+ in all extremities and she had no pathological reflexes.  Sensory - Light touch, temperature/pinprick were assessed and were symmetrical.    Coordination - The patient had normal movements in the hands and feet with no ataxia or dysmetria.  Tremor was absent.  Gait and Station - deferred due to safety concerns.   ASSESSMENT/PLAN [name] is a 75 y.o. female with history of HTN, DM, HLD, fibromyalgia presenting with slurred speech and confusion. She did not receive IV t-PA.   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 small vessel disease   MRA  No lage vessel occlusion. Severe B PCA, moderate L ICA and mild R ICA stenosis  Carotid Doppler  unremarkable  2D Echo  EF 50-55%  LDL 189  HgbA1c 6.6  Lovenox 40 mg sq daily for VTE prophylaxis  DIET DYS 3 Room service appropriate? Yes; Fluid consistency: Nectar Thick  No antithrombotic daily prior to admission, now on  aspirin 325 mg daily  Patient counseled to be compliant with her antithrombotic medications  Ongoing aggressive stroke risk factor management  Therapy recommendations:  CIR  Disposition:  pending   Hypertension  Stable Permissive hypertension (OK if < 220/120) but gradually normalize in 5-7 days Long-term BP goal normotensive  Hyperlipidemia  Home meds:  crestor 42, resumed in hospital  LDL 189, goal < 70  Continue statin at discharge  Diabetes type II  HgbA1c 6.6, goal < 7.0  Controlled  SSI  Other Stroke Risk Factors  Advanced age  Obesity, Body mass index is 34.01 kg/m., recommend weight loss, diet and exercise as appropriate   Other Active Problems  Fibromyalgia  Neuromuscular disorder, parkinson neuropathy  COPD  Depression   Temporal artery bx pending - however less like to be TA given symptom description. D/c prednisone.   Hospital day # 2  Neurology will sign off. Please call with questions. Pt will follow up with Cecille Rubin NP at [facility] in about 6 weeks. Thanks  for the consult.  Rosalin Hawking, MD PhD Stroke Neurology 03/22/2016 11:36 PM    To contact Stroke Continuity provider, please refer to [email or web address] After hours, contact General Neurology

## 2016-03-22 NOTE — Progress Notes (Signed)
  Echocardiogram 2D Echocardiogram has been performed.  Tasnia Spegal 03/22/2016, 2:30 PM

## 2016-03-22 NOTE — Progress Notes (Signed)
Rehab admissions - Noted stroke workup is ongoing.  I will follow up with patient/family tomorrow for potential acute inpatient rehab admission.  Call me for questions.  [id number]

## 2016-03-22 NOTE — Progress Notes (Signed)
PROGRESS NOTE    Susan Patel  OHY:[id number] DOB: June 10, 1940 DOA: 03/20/2016 PCP: Evelina Dun, FNP   Chief Complaint  Patient presents with  . Altered Mental Status    Brief Narrative:  HPI on 03/20/2016 by Dr. Domingo Mend Susan Patel is a 75 y.o. female with h/o HTN, DM, HLD, fibromyalgia. Yesterday she had "a surgery to a blood vessel" in her right temple. Husband says this was done because she was having vision problems. Surgery was done at the urging of a retinal specialist. I wonder if there was a concern for GCA (temporal arteritis) altho she was never started on steroids. No records to this effect on her chart. She was last seen normal at 11:30 pm last night by her husband when they went to bed. This am when they woke up he noticed her to be confused, stumbling, having difficulty with word finding and slurred speech. He subsequently noted a left sided facial droop and brought her to the ED. Her symptoms have persisted. CT Head is without abnormality. EDP has discussed with neurohospitalist at [facility]; due to lack of neurology services and MRI at [facility] over the weekend, she will be transferred to [facility] today. TPA not given since outside window. History given mostly by husband and sister at bedside as patient remains drowsy and confused. Assessment & Plan   Acute CVA  -CT head: unremarkable for CVA -MRI brain: Acute left internal capsule infarct.  -MRA brain: no large vessel occlusion  -LDL: 189 -Hemoglobin A1c 6.6 -Started on statin and aspirin today -?concern for giant cell arteritis/temporal arteritis. S/p biopsy on 11/3. Started on prednisone. ESR 42 -Neurology consulted and appreciated -pending echocardiogram, carotid doppler -PT/OT consulted, recommended CIR -Speech/swallow eval- on dysphagia 3 diet -CIR consulted and pending   Essential hypertension -Allow for permissive hypertension -losartan held  -Continue ISS and CBG monitoring   Diabetes mellitus, Type II -A1c 6.6   -Glimepiride held  Hyperlipidiemia -Lipid panel: LDl 263, TG 138, LDL 189, HDL 46 -continue statin -Not sure if patient was taking her statin- husband stated she had stopped for a while.  DVT Prophylaxis  lovneox  Code Status: Full  Family Communication: Family at bedside  Disposition Plan: Admitted. Pending further workup- possible CIR upon discharge  Consultants Neurology Inpatient rehab  Procedures  None  Antibiotics   Anti-infectives    None      Subjective:   Susan Patel seen and examined today.  Patient feeling better today.  Husband states her speech is continuing to improve.  They have trip planned to [facility] in one week. Denies dizziness, chest pain, shortness of breath, abdominal pain, N/V/D/C.  Objective:   Vitals:   03/22/16 0153 03/22/16 0205 03/22/16 0637 03/22/16 1011  BP: (!) 179/98 (!) 157/55 (!) 180/55 (!) 160/57  Pulse: 68 70 64 78  Resp: _0 Temp: 98.7 F (37.1 C)  97.7 F (36.5 C)   TempSrc: Oral  Oral   SpO2: 96%  100% 97%  Weight:      Height:        Intake/Output Summary (Last 24 hours) at 03/22/16 1019 Last data filed at 03/22/16 9485  Gross per 24 hour  Intake               50 ml  Output              251 ml  Net             -201  ml   Filed Weights   03/20/16 1143  Weight: 81.6 kg (180 lb)    Exam  General: Well developed, well nourished, NAD, appears stated age  55: NCAT,mucous membranes moist.   Cardiovascular: S1 S2 auscultated, RRR, no murmurs  Respiratory: Clear to auscultation bilaterally with equal chest rise  Abdomen: Soft, nontender, nondistended, + bowel sounds  Extremities: warm dry without cyanosis clubbing or edema  Neuro: AAOx3, speech slightly slurred, no new deficits.   Psych: Normal affect and demeanor    Data Reviewed: I have personally reviewed following labs and imaging studies  CBC:  Recent Labs Lab 03/20/16 1243 03/20/16 2010  WBC 6.4 8.1  NEUTROABS 3.9  --   HGB  11.5* 12.7  HCT 35.5* 38.2  MCV 87.4 85.1  PLT 351 263   Basic Metabolic Panel:  Recent Labs Lab 03/20/16 1243 03/20/16 2010  NA 139  --   K 4.3  --   CL 105  --   CO2 29  --   GLUCOSE 130*  --   BUN 17  --   CREATININE 1.06* 0.89  CALCIUM 8.8*  --    GFR: Estimated Creatinine Clearance: 54.5 mL/min (by C-G formula based on SCr of 0.89 mg/dL). Liver Function Tests:  Recent Labs Lab 03/20/16 1243  AST 17  ALT 15  ALKPHOS 104  BILITOT 0.4  PROT 6.4*  ALBUMIN 3.2*   No results for input(s): LIPASE, AMYLASE in the last 168 hours. No results for input(s): AMMONIA in the last 168 hours. Coagulation Profile:  Recent Labs Lab 03/20/16 1243  INR 0.96   Cardiac Enzymes:  Recent Labs Lab 03/20/16 1243  TROPONINI <0.03   BNP (last 3 results) No results for input(s): PROBNP in the last 8760 hours. HbA1C:  Recent Labs  03/20/16 1243 03/21/16 0517  HGBA1C 6.6* 6.6*   CBG:  Recent Labs Lab 03/21/16 0848 03/21/16 1237 03/21/16 1803 03/21/16 2140 03/22/16 0825  GLUCAP 141* 127* 141* 177* 105*   Lipid Profile:  Recent Labs  03/21/16 0517  CHOL 263*  HDL 46  LDLCALC 189*  TRIG 138  CHOLHDL 5.7   Thyroid Function Tests:  Recent Labs  03/22/16 0631  TSH 3.699   Anemia Panel:  Recent Labs  03/22/16 0631  VITAMINB12 776   Urine analysis:    Component Value Date/Time   COLORURINE YELLOW 03/20/2016 1350   APPEARANCEUR CLEAR 03/20/2016 1350   LABSPEC 1.020 03/20/2016 1350   PHURINE 5.5 03/20/2016 1350   GLUCOSEU NEGATIVE 03/20/2016 1350   HGBUR NEGATIVE 03/20/2016 1350   BILIRUBINUR NEGATIVE 03/20/2016 1350   KETONESUR NEGATIVE 03/20/2016 1350   PROTEINUR NEGATIVE 03/20/2016 1350   NITRITE NEGATIVE 03/20/2016 1350   LEUKOCYTESUR MODERATE (A) 03/20/2016 1350   Sepsis Labs: _0 (procalcitonin:4,lacticidven:4)  )No results found for this or any previous visit (from the past 240 hour(s)).    Radiology Studies: Ct Head Wo  Contrast  Result Date: 03/20/2016 CLINICAL DATA:  Left-sided facial droop with slurred speech, initial encounter EXAM: CT HEAD WITHOUT CONTRAST TECHNIQUE: Contiguous axial images were obtained from the base of the skull through the vertex without intravenous contrast. COMPARISON:  None. FINDINGS: Brain: No evidence of acute infarction, hemorrhage, hydrocephalus, extra-axial collection or mass lesion/mass effect. Atrophic and chronic white matter ischemic changes are noted. Vascular: No hyperdense vessel or unexpected calcification. Skull: Normal. Negative for fracture or focal lesion. Sinuses/Orbits: No acute finding. Other: None. IMPRESSION: Chronic atrophic and ischemic changes without acute infarct. Electronically Signed   By: Elta Guadeloupe  Lukens M.D.   On: 03/20/2016 13:22   Mr Brain Wo Contrast  Result Date: 03/21/2016 CLINICAL DATA:  Facial droop with slurred speech. EXAM: MRI HEAD WITHOUT CONTRAST MRA HEAD WITHOUT CONTRAST TECHNIQUE: Multiplanar, multiecho pulse sequences of the brain and surrounding structures were obtained without intravenous contrast. Angiographic images of the head were obtained using MRA technique without contrast. COMPARISON:  Head CT 03/20/2016 FINDINGS: MRI HEAD FINDINGS Brain: There is a small acute infarct in the posterior limb of the left internal capsule. There is no evidence of intracranial hemorrhage, mass, midline shift, or extra-axial fluid collection. There is moderate cerebral atrophy. Chronic lacunar infarcts are present in the right lentiform nucleus, left thalamus, corpus callosum, and in the periventricular white matter bilaterally. Cerebral white matter T2 hyperintensities are compatible with extensive chronic small vessel ischemic disease. Mild chronic small vessel ischemic changes are present in the pons. Vascular: Major intracranial vascular flow voids are preserved. Skull and upper cervical spine: No suspicious osseous lesion identified. Prior anterior cervical  fusion. Sinuses/Orbits: Prior bilateral cataract extraction. Trace left mastoid effusion. Clear paranasal sinuses. Other: None. MRA HEAD FINDINGS The visualized distal vertebral arteries are patent to the basilar with the right being mildly dominant. There is mild irregularity an up to mild narrowing of both vertebral arteries. Right AICA appears dominant. PICAs are not clearly identified. SCA origins are patent. Basilar artery is patent without significant stenosis. The posterior communicating arteries are not identified. There are severe left P1 and and bilateral P2 stenoses with right greater than left distal PCA attenuation. The internal carotid arteries are patent from skullbase to carotid termini with mild right and moderate left supraclinoid segment stenoses. A1 and M1 segments are patent without evidence of significant stenosis. There are moderate proximal M2 stenoses bilaterally. There are also moderate to severe distal A2/ proximal A3 stenoses. No intracranial aneurysm is identified. IMPRESSION: 1. Acute left internal capsule infarct. 2. Extensive chronic small vessel ischemic disease with numerous chronic lacunar infarcts. 3. No large vessel occlusion. 4. Intracranial atherosclerosis as above including mild bilateral vertebral, severe bilateral PCA, mild right ICA, and moderate left ICA stenoses. Electronically Signed   By: Logan Bores M.D.   On: 03/21/2016 07:23   Dg Swallowing Func-speech Pathology  Result Date: 03/21/2016 Objective Swallowing Evaluation: Type of Study: MBS-Modified Barium Swallow Study Patient Details Name: Susan Patel MRN: [id number] Date of Birth: 1941-03-20 Today's Date: 03/21/2016 Time: SLP Start Time (ACUTE ONLY): 1015-SLP Stop Time (ACUTE ONLY): 1030 SLP Time Calculation (min) (ACUTE ONLY): 15 min Past Medical History: Past Medical History: Diagnosis Date . Arthritis   'all over" . Asthma  . COPD (chronic obstructive pulmonary disease) (HCC)   O2 at nasal prongs . Depression   . Diabetes mellitus without complication ([place])   borderline- , states she was on med., but MD told her "everything is under control so I threw the bottle away" . Fibromyalgia  . GERD (gastroesophageal reflux disease)  . Hypertension  . Neuromuscular disorder (HCC)   parkinson, neuropathy- both feet & hands. Past Surgical History: Past Surgical History: Procedure Laterality Date . ABDOMINAL HYSTERECTOMY   . ANTERIOR CERVICAL DECOMP/DISCECTOMY FUSION N/A 07/10/2015  Procedure: Cervical five-six, Cervical six-seven anterior cervical decompression with fusion plating and bonegraft;  Surgeon: Jovita Gamma, MD;  Location: [facility];  Service: Neurosurgery;  Laterality: N/A; . APPENDECTOMY   . BIOPSY EYE MUSCLE  03/20/2016  biopsy vessel to right eye due to swelling . EYE SURGERY Bilateral   cataracts removed, /w "cyrstal  lenses"  . SHOULDER ARTHROSCOPY Right   x2   RCR- spurs removed  HPI: 75 y.o.femalewith h/o HTN, DM, HLD, fibromyalgia, GERD, ACDF Feb 2017 admitted with symptoms of stroke. 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  Failed RN stroke swallow.  MRI left internal capsule infarct; Stroke w/u pending.  Subjective: pleasant; husband present Assessment / Plan / Recommendation CHL IP CLINICAL IMPRESSIONS 03/21/2016 Therapy Diagnosis Mild pharyngeal phase dysphagia Clinical Impression Pt presents with a mild pharyngeal phase dysphagia marked by delayed swallow response to the level of the pyriform sinuses for both nectar and thin liquids; there is immediate and consistent trace aspiration of thin liquids (liquids reach vocal folds and just beneath - triggering a cough response).  Nectar thick liquids did not penetrate the larynx.  There was good pharyngeal clearance and no residue post-swallow.  Postural adjustments (chin tuck, head turn) were not effective in preventing aspiration.  For now, recommend initiating a dysphagia 3 diet with nectar-thick liquids.  Pt and husband viewed video and  we discussed results/recommendations.  Impact on safety and function Mild aspiration risk   CHL IP TREATMENT RECOMMENDATION 03/21/2016 Treatment Recommendations Therapy as outlined in treatment plan below   Prognosis 03/21/2016 Prognosis for Safe Diet Advancement Good Barriers to Reach Goals -- Barriers/Prognosis Comment -- CHL IP DIET RECOMMENDATION 03/21/2016 SLP Diet Recommendations Dysphagia 3 (Mech soft) solids;Nectar thick liquid Liquid Administration via Cup Medication Administration Whole meds with puree Compensations Minimize environmental distractions;Slow rate;Small sips/bites Postural Changes Seated upright at 90 degrees   CHL IP OTHER RECOMMENDATIONS 03/21/2016 Recommended Consults -- Oral Care Recommendations Oral care BID Other Recommendations Order thickener from pharmacy   CHL IP FOLLOW UP RECOMMENDATIONS 03/21/2016 Follow up Recommendations (No Data)   CHL IP FREQUENCY AND DURATION 03/21/2016 Speech Therapy Frequency (ACUTE ONLY) min 2x/week Treatment Duration 1 week      CHL IP ORAL PHASE 03/21/2016 Oral Phase WFL Oral - Pudding Teaspoon -- Oral - Pudding Cup -- Oral - Honey Teaspoon -- Oral - Honey Cup -- Oral - Nectar Teaspoon -- Oral - Nectar Cup -- Oral - Nectar Straw -- Oral - Thin Teaspoon -- Oral - Thin Cup -- Oral - Thin Straw -- Oral - Puree -- Oral - Mech Soft -- Oral - Regular -- Oral - Multi-Consistency -- Oral - Pill -- Oral Phase - Comment --  CHL IP PHARYNGEAL PHASE 03/21/2016 Pharyngeal Phase Impaired Pharyngeal- Pudding Teaspoon -- Pharyngeal -- Pharyngeal- Pudding Cup -- Pharyngeal -- Pharyngeal- Honey Teaspoon -- Pharyngeal -- Pharyngeal- Honey Cup -- Pharyngeal -- Pharyngeal- Nectar Teaspoon -- Pharyngeal -- Pharyngeal- Nectar Cup Delayed swallow initiation-pyriform sinuses Pharyngeal -- Pharyngeal- Nectar Straw -- Pharyngeal -- Pharyngeal- Thin Teaspoon -- Pharyngeal -- Pharyngeal- Thin Cup Delayed swallow initiation-pyriform sinuses;Reduced airway/laryngeal closure;Trace  aspiration;Penetration/Aspiration during swallow Pharyngeal Material enters airway, passes BELOW cords and not ejected out despite cough attempt by patient Pharyngeal- Thin Straw -- Pharyngeal -- Pharyngeal- Puree WFL Pharyngeal -- Pharyngeal- Mechanical Soft -- Pharyngeal -- Pharyngeal- Regular WFL Pharyngeal -- Pharyngeal- Multi-consistency -- Pharyngeal -- Pharyngeal- Pill -- Pharyngeal -- Pharyngeal Comment --  No flowsheet data found. No flowsheet data found. Susan Patel 03/21/2016, 10:42 AM              Mr Jodene Nam Head/brain Wo Cm  Result Date: 03/21/2016 CLINICAL DATA:  Facial droop with slurred speech. EXAM: MRI HEAD WITHOUT CONTRAST MRA HEAD WITHOUT CONTRAST TECHNIQUE: Multiplanar, multiecho pulse sequences of the brain and surrounding structures were obtained without intravenous contrast. Angiographic  images of the head were obtained using MRA technique without contrast. COMPARISON:  Head CT 03/20/2016 FINDINGS: MRI HEAD FINDINGS Brain: There is a small acute infarct in the posterior limb of the left internal capsule. There is no evidence of intracranial hemorrhage, mass, midline shift, or extra-axial fluid collection. There is moderate cerebral atrophy. Chronic lacunar infarcts are present in the right lentiform nucleus, left thalamus, corpus callosum, and in the periventricular white matter bilaterally. Cerebral white matter T2 hyperintensities are compatible with extensive chronic small vessel ischemic disease. Mild chronic small vessel ischemic changes are present in the pons. Vascular: Major intracranial vascular flow voids are preserved. Skull and upper cervical spine: No suspicious osseous lesion identified. Prior anterior cervical fusion. Sinuses/Orbits: Prior bilateral cataract extraction. Trace left mastoid effusion. Clear paranasal sinuses. Other: None. MRA HEAD FINDINGS The visualized distal vertebral arteries are patent to the basilar with the right being mildly dominant. There is  mild irregularity an up to mild narrowing of both vertebral arteries. Right AICA appears dominant. PICAs are not clearly identified. SCA origins are patent. Basilar artery is patent without significant stenosis. The posterior communicating arteries are not identified. There are severe left P1 and and bilateral P2 stenoses with right greater than left distal PCA attenuation. The internal carotid arteries are patent from skullbase to carotid termini with mild right and moderate left supraclinoid segment stenoses. A1 and M1 segments are patent without evidence of significant stenosis. There are moderate proximal M2 stenoses bilaterally. There are also moderate to severe distal A2/ proximal A3 stenoses. No intracranial aneurysm is identified. IMPRESSION: 1. Acute left internal capsule infarct. 2. Extensive chronic small vessel ischemic disease with numerous chronic lacunar infarcts. 3. No large vessel occlusion. 4. Intracranial atherosclerosis as above including mild bilateral vertebral, severe bilateral PCA, mild right ICA, and moderate left ICA stenoses. Electronically Signed   By: Logan Bores M.D.   On: 03/21/2016 07:23     Scheduled Meds: . aspirin EC  81 mg Oral Daily  . enoxaparin (LOVENOX) injection  40 mg Subcutaneous Q24H  . fluticasone furoate-vilanterol  1 puff Inhalation QAC breakfast  . insulin aspart  0-5 Units Subcutaneous QHS  . insulin aspart  0-9 Units Subcutaneous TID WC  . latanoprost  1 drop Both Eyes QHS  . rosuvastatin  40 mg Oral Daily   Continuous Infusions:   LOS: 2 days   Time Spent in minutes   30 minutes  Mithran Strike D.O. on 03/22/2016 at 10:19 AM  Between 7am to 7pm - Pager - [phone]  After 7pm go to [email or web address] - password [email or web address]  And look for the night coverage person covering for me after hours  [facility] Office  [phone]

## 2016-03-23 ENCOUNTER — Inpatient Hospital Stay (HOSPITAL_COMMUNITY)
Admission: RE | Admit: 2016-03-23 | Discharge: 2016-03-31 | DRG: 092 | Disposition: A | Discharge status: Home / Self Care | Payer: Medicare Other | Source: Intra-hospital | Attending: Physical Medicine & Rehabilitation | Admitting: Physical Medicine & Rehabilitation

## 2016-03-23 DIAGNOSIS — Z888 Allergy status to other drugs, medicaments and biological substances status: Secondary | ICD-10-CM | POA: Diagnosis not present

## 2016-03-23 DIAGNOSIS — K5900 Constipation, unspecified: Secondary | ICD-10-CM | POA: Diagnosis present

## 2016-03-23 DIAGNOSIS — J410 Simple chronic bronchitis: Secondary | ICD-10-CM

## 2016-03-23 DIAGNOSIS — I69391 Dysphagia following cerebral infarction: Secondary | ICD-10-CM | POA: Diagnosis not present

## 2016-03-23 DIAGNOSIS — E1140 Type 2 diabetes mellitus with diabetic neuropathy, unspecified: Secondary | ICD-10-CM | POA: Diagnosis present

## 2016-03-23 DIAGNOSIS — R1313 Dysphagia, pharyngeal phase: Secondary | ICD-10-CM | POA: Diagnosis present

## 2016-03-23 DIAGNOSIS — R2689 Other abnormalities of gait and mobility: Principal | ICD-10-CM | POA: Diagnosis present

## 2016-03-23 DIAGNOSIS — M797 Fibromyalgia: Secondary | ICD-10-CM | POA: Diagnosis present

## 2016-03-23 DIAGNOSIS — I69320 Aphasia following cerebral infarction: Secondary | ICD-10-CM | POA: Diagnosis not present

## 2016-03-23 DIAGNOSIS — E876 Hypokalemia: Secondary | ICD-10-CM | POA: Diagnosis present

## 2016-03-23 DIAGNOSIS — Z981 Arthrodesis status: Secondary | ICD-10-CM | POA: Diagnosis not present

## 2016-03-23 DIAGNOSIS — Z7982 Long term (current) use of aspirin: Secondary | ICD-10-CM

## 2016-03-23 DIAGNOSIS — E782 Mixed hyperlipidemia: Secondary | ICD-10-CM

## 2016-03-23 DIAGNOSIS — F329 Major depressive disorder, single episode, unspecified: Secondary | ICD-10-CM | POA: Diagnosis present

## 2016-03-23 DIAGNOSIS — I10 Essential (primary) hypertension: Secondary | ICD-10-CM

## 2016-03-23 DIAGNOSIS — Z91048 Other nonmedicinal substance allergy status: Secondary | ICD-10-CM | POA: Diagnosis not present

## 2016-03-23 DIAGNOSIS — G8929 Other chronic pain: Secondary | ICD-10-CM | POA: Diagnosis present

## 2016-03-23 DIAGNOSIS — K219 Gastro-esophageal reflux disease without esophagitis: Secondary | ICD-10-CM | POA: Diagnosis present

## 2016-03-23 DIAGNOSIS — I69322 Dysarthria following cerebral infarction: Secondary | ICD-10-CM | POA: Diagnosis not present

## 2016-03-23 DIAGNOSIS — Z881 Allergy status to other antibiotic agents status: Secondary | ICD-10-CM | POA: Diagnosis not present

## 2016-03-23 DIAGNOSIS — Z79899 Other long term (current) drug therapy: Secondary | ICD-10-CM | POA: Diagnosis not present

## 2016-03-23 DIAGNOSIS — E46 Unspecified protein-calorie malnutrition: Secondary | ICD-10-CM | POA: Diagnosis present

## 2016-03-23 DIAGNOSIS — I63412 Cerebral infarction due to embolism of left middle cerebral artery: Secondary | ICD-10-CM | POA: Diagnosis present

## 2016-03-23 DIAGNOSIS — J449 Chronic obstructive pulmonary disease, unspecified: Secondary | ICD-10-CM | POA: Diagnosis present

## 2016-03-23 DIAGNOSIS — Z9981 Dependence on supplemental oxygen: Secondary | ICD-10-CM | POA: Diagnosis not present

## 2016-03-23 LAB — BASIC METABOLIC PANEL
Anion gap: 8 (ref 5–15)
BUN: 16 mg/dL (ref 6–20)
CALCIUM: 8.8 mg/dL — AB (ref 8.9–10.3)
CO2: 24 mmol/L (ref 22–32)
CREATININE: 0.83 mg/dL (ref 0.44–1.00)
Chloride: 107 mmol/L (ref 101–111)
GFR calc Af Amer: >60 mL/min (ref >60)
Glucose, Bld: 169 mg/dL — ABNORMAL HIGH (ref 65–99)
Potassium: 3.2 mmol/L — ABNORMAL LOW (ref 3.5–5.1)
SODIUM: 139 mmol/L (ref 135–145)

## 2016-03-23 LAB — VAS US CAROTID
LCCADSYS: 73 cm/s
LCCAPDIAS: -8 cm/s
LCCAPSYS: -96 cm/s
LEFT ECA DIAS: -9 cm/s
LEFT VERTEBRAL DIAS: 7 cm/s
LICADDIAS: -27 cm/s
LICAPSYS: -102 cm/s
Left CCA dist dias: 18 cm/s
Left ICA dist sys: -85 cm/s
Left ICA prox dias: -25 cm/s
RCCAPSYS: 83 cm/s
RIGHT ECA DIAS: -8 cm/s
RIGHT VERTEBRAL DIAS: -13 cm/s
Right CCA prox dias: 17 cm/s
Right cca dist sys: -65 cm/s

## 2016-03-23 LAB — GLUCOSE, CAPILLARY
GLUCOSE-CAPILLARY: 120 mg/dL — AB (ref 65–99)
GLUCOSE-CAPILLARY: 127 mg/dL — AB (ref 65–99)
Glucose-Capillary: 128 mg/dL — ABNORMAL HIGH (ref 65–99)
Glucose-Capillary: 175 mg/dL — ABNORMAL HIGH (ref 65–99)

## 2016-03-23 MED ORDER — ACETAMINOPHEN 325 MG PO TABS
325.0000 mg | ORAL_TABLET | ORAL | Status: DC | PRN
  Administered 2016-03-27 – 2016-03-29 (×2): 650 mg via ORAL
  Filled 2016-03-23 (×2): qty 2

## 2016-03-23 MED ORDER — PROCHLORPERAZINE EDISYLATE 5 MG/ML IJ SOLN: 5.0000 mg | Freq: Four times a day (QID) | INTRAMUSCULAR | Status: DC | PRN

## 2016-03-23 MED ORDER — TRAZODONE HCL 50 MG PO TABS
25.0000 mg | ORAL_TABLET | Freq: Every evening | ORAL | Status: DC | PRN
  Administered 2016-03-23 – 2016-03-30 (×8): 50 mg via ORAL
  Filled 2016-03-23 (×8): qty 1

## 2016-03-23 MED ORDER — FLEET ENEMA 7-19 GM/118ML RE ENEM: 1.0000 | ENEMA | Freq: Once | RECTAL | Status: DC | PRN

## 2016-03-23 MED ORDER — DULOXETINE HCL 30 MG PO CPEP
30.0000 mg | ORAL_CAPSULE | Freq: Every day | ORAL | Status: DC
  Administered 2016-03-23 – 2016-03-31 (×9): 30 mg via ORAL
  Filled 2016-03-23 (×9): qty 1

## 2016-03-23 MED ORDER — PROCHLORPERAZINE 25 MG RE SUPP: 12.5000 mg | Freq: Four times a day (QID) | RECTAL | Status: DC | PRN

## 2016-03-23 MED ORDER — ROSUVASTATIN CALCIUM 20 MG PO TABS
40.0000 mg | ORAL_TABLET | Freq: Every day | ORAL | Status: DC
  Administered 2016-03-24 – 2016-03-31 (×8): 40 mg via ORAL
  Filled 2016-03-23 (×8): qty 2

## 2016-03-23 MED ORDER — LATANOPROST 0.005 % OP SOLN
1.0000 [drp] | Freq: Every day | OPHTHALMIC | Status: DC
  Administered 2016-03-23 – 2016-03-30 (×8): 1 [drp] via OPHTHALMIC
  Filled 2016-03-23: qty 2.5

## 2016-03-23 MED ORDER — CLONIDINE HCL 0.1 MG PO TABS: 0.1000 mg | ORAL_TABLET | Freq: Four times a day (QID) | ORAL | Status: DC | PRN

## 2016-03-23 MED ORDER — SENNOSIDES-DOCUSATE SODIUM 8.6-50 MG PO TABS
1.0000 | ORAL_TABLET | Freq: Every evening | ORAL | Status: DC | PRN
Start: 2016-03-23 — End: 2016-03-23

## 2016-03-23 MED ORDER — INSULIN ASPART 100 UNIT/ML ~~LOC~~ SOLN: 0.0000 [IU] | Freq: Every day | SUBCUTANEOUS | Status: DC

## 2016-03-23 MED ORDER — PROCHLORPERAZINE MALEATE 5 MG PO TABS: 5.0000 mg | ORAL_TABLET | Freq: Four times a day (QID) | ORAL | Status: DC | PRN

## 2016-03-23 MED ORDER — ASPIRIN EC 325 MG PO TBEC
325.0000 mg | DELAYED_RELEASE_TABLET | Freq: Every day | ORAL | Status: DC
  Administered 2016-03-24 – 2016-03-31 (×8): 325 mg via ORAL
  Filled 2016-03-23 (×8): qty 1

## 2016-03-23 MED ORDER — INSULIN ASPART 100 UNIT/ML ~~LOC~~ SOLN
0.0000 [IU] | Freq: Three times a day (TID) | SUBCUTANEOUS | Status: DC
  Administered 2016-03-23 – 2016-03-29 (×6): 1 [IU] via SUBCUTANEOUS

## 2016-03-23 MED ORDER — FLUTICASONE FUROATE-VILANTEROL 200-25 MCG/INH IN AEPB
1.0000 | INHALATION_SPRAY | Freq: Every day | RESPIRATORY_TRACT | Status: DC
  Administered 2016-03-24 – 2016-03-31 (×5): 1 via RESPIRATORY_TRACT
  Filled 2016-03-23 (×2): qty 28

## 2016-03-23 MED ORDER — PANTOPRAZOLE SODIUM 40 MG PO TBEC
40.0000 mg | DELAYED_RELEASE_TABLET | Freq: Every day | ORAL | Status: DC
  Administered 2016-03-23 – 2016-03-31 (×9): 40 mg via ORAL
  Filled 2016-03-23 (×9): qty 1

## 2016-03-23 MED ORDER — DIPHENHYDRAMINE HCL 12.5 MG/5ML PO ELIX: 12.5000 mg | ORAL_SOLUTION | Freq: Four times a day (QID) | ORAL | Status: DC | PRN

## 2016-03-23 MED ORDER — POTASSIUM CHLORIDE CRYS ER 20 MEQ PO TBCR
20.0000 meq | EXTENDED_RELEASE_TABLET | Freq: Two times a day (BID) | ORAL | Status: AC
Start: 2016-03-23 — End: 2016-03-24
  Administered 2016-03-23 – 2016-03-24 (×2): 20 meq via ORAL
  Filled 2016-03-23 (×2): qty 1

## 2016-03-23 MED ORDER — ALUM & MAG HYDROXIDE-SIMETH 200-200-20 MG/5ML PO SUSP
30.0000 mL | ORAL | Status: DC | PRN
Start: 2016-03-23 — End: 2016-03-31

## 2016-03-23 MED ORDER — BISACODYL 10 MG RE SUPP: 10.0000 mg | Freq: Every day | RECTAL | Status: DC | PRN

## 2016-03-23 MED ORDER — TRAZODONE HCL 50 MG PO TABS: 50.0000 mg | ORAL_TABLET | Freq: Every evening | ORAL | Status: DC | PRN

## 2016-03-23 MED ORDER — ASPIRIN 325 MG PO TBEC: 325.0000 mg | DELAYED_RELEASE_TABLET | Freq: Every day | ORAL | 0 refills | Status: DC

## 2016-03-23 MED ORDER — LOSARTAN POTASSIUM 100 MG PO TABS: 100.0000 mg | ORAL_TABLET | Freq: Every day | ORAL | 0 refills | Status: DC

## 2016-03-23 MED ORDER — SENNOSIDES-DOCUSATE SODIUM 8.6-50 MG PO TABS: 1.0000 | ORAL_TABLET | Freq: Every evening | ORAL | Status: DC | PRN

## 2016-03-23 MED ORDER — ENOXAPARIN SODIUM 40 MG/0.4ML ~~LOC~~ SOLN
40.0000 mg | SUBCUTANEOUS | Status: DC
  Administered 2016-03-24 – 2016-03-30 (×7): 40 mg via SUBCUTANEOUS
  Filled 2016-03-23 (×7): qty 0.4

## 2016-03-23 MED ORDER — ROSUVASTATIN CALCIUM 40 MG PO TABS: 40.0000 mg | ORAL_TABLET | Freq: Every day | ORAL | 0 refills | Status: DC

## 2016-03-23 NOTE — Progress Notes (Signed)
Speech Language Pathology Treatment: Dysphagia;Cognitive-Linquistic  Patient Details Name: Susan Patel MRN: [id number] DOB: 1941/05/10 Today's Date: 03/23/2016 Time: 0910-0940 SLP Time Calculation (min) (ACUTE ONLY): 30 min  Assessment / Plan / Recommendation Clinical Impression  Pt seen with am meal, persistent coughing if bolus size is not strictly controlled with nectar and thin liquids. Pt and husband report he has been given her thin liquids occasionally and she "does fine as long as she takes small sips." but SLP demonstrated to both that consistent coughing occurs even with very small sips, even if controlled by SLP if liquids are not thick enough. Explained problem of timing of swallow response and speed of liquid  As well as consequence of aspiration to reinforce pt and husbands understanding.  Pt was minimally verbal during meal preferring to nod to husband rather than verbalize. Husband reports he feels she has worsened over the past two days, but pt admits to avoiding verbal communication, which SLP encouraged. SLP conducted further diagnostic treatment. 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 Offered moderate verabl cueing and modeling for rate of speech and compensatory strategies for intelligibility. Suggest next therapist consider utilizing the Apraxia of Speech Rating Scale (ASRS) or the E_WIL and PVI_WS. Continue to recommend CIR at next level of care.    HPI HPI: 75 y.o.femalewith h/o HTN, DM, HLD, fibromyalgia, GERD, ACDF Feb 2017 admitted with symptoms of stroke. 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  Failed RN stroke swallow.  MRI left internal capsule infarct; Stroke w/u pending.       SLP Plan  Continue with current plan of care      Recommendations  Diet recommendations: Dysphagia 3 (mechanical soft);Nectar-thick liquid Liquids provided via: Cup Medication Administration: Whole meds with puree Supervision: Patient able to self feed Compensations: Minimize environmental distractions;Slow rate;Small sips/bites Postural Changes and/or Swallow Maneuvers: Seated upright 90 degrees                Plan: Continue with current plan of care       GO                Alisabeth Selkirk, Katherene Ponto 03/23/2016, 1:13 PM

## 2016-03-23 NOTE — Progress Notes (Signed)
Rehab admissions - I met with patient and her husband this am.  They would like inpatient rehab admission prior to home with husband.  Patient has been cleared by attending MD.  Bed available and will admit to acute inpatient rehab today.  Call me for questions.  #[phone]

## 2016-03-23 NOTE — PMR Pre-admission (Signed)
PMR Admission Coordinator Pre-Admission Assessment  Patient: Susan Patel is an 75 y.o., female MRN: [id number] DOB: 1940-12-04 Height: 5\' 1"  (154.9 cm) Weight: 81.6 kg (180 lb)              Insurance Information HMO: No    PPO:       PCP:       IPA:       80/20:       OTHER:   PRIMARY:  Medicare A/B      Policy#: [id number]      Subscriber: Susan Patel CM Name:        Phone#:       Fax#:   Pre-Cert#:        Employer: Retired Benefits:  Phone #:       Name: Checked in [place]. Date: 01/16/08     Deduct: $1316      Out of Pocket Max: none      Life Max: unlimited CIR: 100%      SNF: 100 days Outpatient: 80%     Co-Pay: 20% Home Health: 100%      Co-Pay: none DME: 80%     Co-Pay: 20% Providers: patient's choice  SECONDARY:  BCBS state health plan      Policy#: [id number]      Subscriber:  Susan Patel CM Name:        Phone#:       Fax#:   Pre-Cert#:        Employer:  Retired Benefits:  Phone #: [phone]     Name:   Eff. Date:       Deduct:        Out of Pocket Max:        Life Max:   CIR:        SNF:   Outpatient:       Co-Pay:   Home Health:        Co-Pay:   DME:       Co-Pay:    Emergency Contact Information Contact Information    Name Relation Home Work Mobile   Susan Patel Spouse [phone]  [phone]     Current Medical History  Patient Admitting Diagnosis:  L IC infarct  History of Present Illness: A 75 y.o.femalewith history of T2DM with neuropathy, HTN, fibromyalgia--chronic pain, COPD--oxygen at nights,right visual deficits (negative work up) who was admitted on 03/20/16 with confusion, left facial droop with slurred speech and word finding deficits as well as difficulty walking. Last seen normal the night before going to bed. 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 Neurology consulted and recommended resuming ASA as patient not compliant with ASA. Work up  in progress. Speech therapy evaluation with moderate dysarthria with mild word finding deficits and mild pharyngeal dysphagia with trace aspiration of thins--placed on dysphagia 3, nectar liquids. Patient with resultant, balance deficits, visual deficits, decreased attention, poor safety awareness with impulsivity affecting safety with mobility. Therapy initiated and and CIR recommended for follow up therapy.    Total: 2=NIH  Past Medical History  Past Medical History:  Diagnosis Date  . Arthritis    'all over"  . Asthma   . COPD (chronic obstructive pulmonary disease) (HCC)    O2 per [place] at nights   . Depression   . Diabetes mellitus without complication ([place])    borderline- , states she was on med., but MD told her "everything is under  control so I threw the bottle away"  . Fibromyalgia   . GERD (gastroesophageal reflux disease)   . Hypertension   . Neuromuscular disorder (HCC)    parkinson, neuropathy- both feet & hands.    Family History  family history includes Cancer in her father; Heart disease in her father and mother.  Prior Rehab/Hospitalizations: No previous rehab admissions  Has the patient had major surgery during 100 days prior to admission? Yes.  Had a biopsy done day before stroke.  Current Medications   Current Facility-Administered Medications:  .  aspirin EC tablet 325 mg, 325 mg, Oral, Daily, Rosalin Hawking, MD .  enoxaparin (LOVENOX) injection 40 mg, 40 mg, Subcutaneous, Q24H, Estela Leonie Green, MD, 40 mg at 03/22/16 2153 .  fluticasone furoate-vilanterol (BREO ELLIPTA) 200-25 MCG/INH 1 puff, 1 puff, Inhalation, QAC breakfast, Erline Hau, MD, 1 puff at 03/23/16 0806 .  hydrALAZINE (APRESOLINE) injection 10 mg, 10 mg, Intravenous, Q6H PRN, Reyne Dumas, MD, 10 mg at 03/23/16 0518 .  insulin aspart (novoLOG) injection 0-5 Units, 0-5 Units, Subcutaneous, QHS, Estela Leonie Green, MD .  insulin aspart (novoLOG) injection 0-9 Units, 0-9  Units, Subcutaneous, TID WC, Estela Leonie Green, MD, 2 Units at 03/22/16 1820 .  latanoprost (XALATAN) 0.005 % ophthalmic solution 1 drop, 1 drop, Both Eyes, QHS, Estela Leonie Green, MD, 1 drop at 03/22/16 2154 .  ondansetron (ZOFRAN) injection 4 mg, 4 mg, Intravenous, Q6H PRN, Erline Hau, MD .  rosuvastatin (CRESTOR) tablet 40 mg, 40 mg, Oral, Daily, Maryann Mikhail, DO, 40 mg at 03/22/16 [id number] .  senna-docusate (Senokot-S) tablet 1 tablet, 1 tablet, Oral, QHS PRN, Erline Hau, MD .  traZODone (DESYREL) tablet 50 mg, 50 mg, Oral, QHS PRN, Rhetta Mura Schorr, NP, 50 mg at 03/22/16 2253  Patients Current Diet: DIET DYS 3 Room service appropriate? Yes; Fluid consistency: Nectar Thick  Precautions / Restrictions Precautions Precautions: Fall Precaution Comments: Impulsive Restrictions Weight Bearing Restrictions: No   Has the patient had 2 or more falls or a fall with injury in the past year?Yes.  Patient reports 20 falls.  Husband reports at least 10 falls with laceration to head and bruising.  Prior Activity Level Limited Community (1-2x/wk): Went out 2-3 X a week, was driving.  Home Assistive Devices / Equipment Home Assistive Devices/Equipment: Cane (specify quad or straight), Built-in shower seat Home Equipment: Shower seat - built in, [employer] - tub/shower, [place] - single point  Prior Device Use: Indicate devices/aids used by the patient prior to current illness, exacerbation or injury? Occasional cane.  Prior Functional Level Prior Function Level of Independence: Independent Comments: Occasional use of cane for mobility.  Self Care: Did the patient need help bathing, dressing, using the toilet or eating?  Independent  Indoor Mobility: Did the patient need assistance with walking from room to room (with or without device)? Independent  Stairs: Did the patient need assistance with internal or external stairs (with or without device)?  Independent  Functional Cognition: Did the patient need help planning regular tasks such as shopping or remembering to take medications? Independent  Current Functional Level Cognition  Arousal/Alertness: Awake/alert Overall Cognitive Status: Impaired/Different from baseline Current Attention Level: Sustained (difficulty focusing in distracting environment) Orientation Level: Oriented to person, Oriented to place, Disoriented to time Following Commands: Follows one step commands inconsistently (especially in busier environment) Safety/Judgement: Decreased awareness of safety, Decreased awareness of deficits General Comments: Patient perseverating on brushing a particular  tooth; couldn't attend to PT instructions while walking in hall (required tactile cues to redirect); stafggering LOB x 4 in hall and when asked if she lost her balance while walking "no"    Extremity Assessment (includes Sensation/Coordination)  Upper Extremity Assessment: Defer to OT evaluation (Decreased coordination RUE noted)  Lower Extremity Assessment: Overall WFL for tasks assessed (Decreased heel-to-shin RLE)    ADLs  Overall ADL's : Needs assistance/impaired Grooming: Min guard, Sitting Upper Body Bathing: Min guard, Sitting Lower Body Bathing: Minimal assistance, Sit to/from stand Upper Body Dressing : Min guard, Sitting Lower Body Dressing: Minimal assistance, Sit to/from stand Lower Body Dressing Details (indicate cue type and reason): Pt able to don pants and pull up in standing but required min assist to steady due to balance deficits. Toilet Transfer: Moderate assistance, Ambulation, BSC (hand held assist) Toilet Transfer Details (indicate cue type and reason): Simulated by sit to stand from EOB with functional mobility in room. Functional mobility during ADLs: Moderate assistance General ADL Comments: Pt with difficutly with word finding and noted to have difficulty managing secretions; coughing  frequently.     Mobility  Overal bed mobility: Needs Assistance Bed Mobility: Supine to Sit, Sit to Supine Supine to sit: Min assist Sit to supine: Min guard General bed mobility comments: cuing for less taxing technique (pt pulling up to sit directly from supine); she was unable to attend to instructions and continued struggling to come to sitting at EOB, reaching for PT to pull her to sitting and then falling back towards mattress    Transfers  Overall transfer level: Needs assistance Equipment used: 1 person hand held assist, None Transfers: Sit to/from Stand Sit to Stand: Min assist General transfer comment: Min assist to boost up from EOB and for balance in standing.; pt leaning excessively forward as coming to stand; improved on second and third trials    Ambulation / Gait / Stairs / Wheelchair Mobility  Ambulation/Gait Ambulation/Gait assistance: Museum/gallery curator (Feet): 110 Feet Assistive device: None Gait Pattern/deviations: Step-through pattern, Decreased stride length, Staggering right, Drifts right/left General Gait Details: Attempted without RW as she appeared to have difficulty using properly 11/5. She became very distracted in hallway with multiple losses of balance to her right.  Gait velocity interpretation: Below normal speed for age/gender    Posture / Balance Static Standing Balance Tandem Stance - Right Leg: 0 (could not achieve even modified position without LOB) Tandem Stance - Left Leg: 0 [place] - Eyes Opened: 0 (required assist to obtain position due to LOB) [place] - Eyes Closed: 0 (minguard with incr sway & step to right) Balance Overall balance assessment: Needs assistance Sitting-balance support: Feet supported, No upper extremity supported Sitting balance-Leahy Scale: Fair Standing balance support: No upper extremity supported Standing balance-Leahy Scale: Poor Standing balance comment: requires assist to achieve balanced/midline  staning Tandem Stance - Right Leg: 0 (could not achieve even modified position without LOB) Tandem Stance - Left Leg: 0 [place] - Eyes Opened: 0 (required assist to obtain position due to LOB) [place] - Eyes Closed: 0 (minguard with incr sway & step to right) High level balance activites: Direction changes, Turns, Sudden stops, Head turns High Level Balance Comments: difficulty following challenges/instructions due to attention    Special needs/care consideration BiPAP/CPAP No CPM No Continuous Drip IV No Dialysis No      Life Vest No Oxygen Yes, on 02 at night 3L [place] at home Special Bed No Trach Size No Wound Vac (area) No  Skin No                             Bowel mgmt: Last BM 03/22/16 Bladder mgmt: Voiding WDL up in bathroom with assistance Diabetic mgmt Yes, on oral medications at home    Previous Home Environment Living Arrangements: Spouse/significant other  Lives With: Spouse Available Help at Discharge: Family, Available PRN/intermittently (Patient reports husband works and she is home alone. ) Type of Home: House Home Layout: Two level, Able to live on main level with bedroom/bathroom Home Access: Ramped entrance Bathroom Shower/Tub: Other (comment) (walk in tub) Bathroom Toilet: [place]: No  Discharge Living Setting Plans for Discharge Living Setting: Patient's home, House, Lives with (comment) (Lives with husband.) Type of Home at Discharge: House Discharge Home Layout: Two level, Able to live on main level with bedroom/bathroom Alternate Level Stairs-Number of Steps: Flight Discharge Home Access: Stairs to enter, Ramped entrance Entrance Stairs-Number of Steps: 5 step entry.  Does have a ramp at the back entry. Does the patient have any problems obtaining your medications?: No  Social/Family/Support Systems Patient Roles: Spouse, Parent (Has a husband, son and daughter.) Contact Information: Roohi Tremper - husband Anticipated  Caregiver: husband Anticipated Caregiver's Contact Information: [name] - spouse - (c) [phone] Ability/Limitations of Caregiver: Husband is retired and can assist.  Husband is a Company secretary and preaches occasionally on Sundays. Caregiver Availability: 24/7 Discharge Plan Discussed with Primary Caregiver: Yes Is Caregiver In Agreement with Plan?: Yes Does Caregiver/Family have Issues with Lodging/Transportation while Pt is in Rehab?: No   Goals/Additional Needs Patient/Family Goal for Rehab: PT/OT mod I and supervision, SLP mod I and I goals Expected length of stay: 10-14 days Cultural Considerations: [employer] holiness.  Is a Company secretary.   Dietary Needs: Dys 3, nectar thick liquids Equipment Needs: TBD Pt/Family Agrees to Admission and willing to participate: Yes Program Orientation Provided & Reviewed with Pt/Caregiver Including Roles  & Responsibilities: Yes  Decrease burden of Care through IP rehab admission: N/A  Possible need for SNF placement upon discharge: Not anticipated  Patient Condition: This patient's condition remains as documented in the consult dated 03/22/16, in which the Rehabilitation Physician determined and documented that the patient's condition is appropriate for intensive rehabilitative care in an inpatient rehabilitation facility. Stroke workup all complete per attending MD.  Will admit to inpatient rehab today.  Preadmission Screen Completed By:  Retta Diones, 03/23/2016 12:18 PM ______________________________________________________________________   Discussed status with Dr. Naaman Plummer on 03/23/16 at 1217 and received telephone approval for admission today.  Admission Coordinator:  Retta Diones, time1217/Date11/07/17

## 2016-03-23 NOTE — Interval H&P Note (Signed)
Susan Patel was admitted today to Inpatient Rehabilitation with the diagnosis of left MCA infarct.  The patient's history has been reviewed, patient examined, and there is no change in status.  Patient continues to be appropriate for intensive inpatient rehabilitation.  I have reviewed the patient's chart and labs.  Questions were answered to the patient's satisfaction. The PAPE has been reviewed and assessment remains appropriate.  Susan Patel T 03/23/2016, 7:23 PM

## 2016-03-23 NOTE — Progress Notes (Signed)
Susan Lorie Phenix, MD Physician Signed Physical Medicine and Rehabilitation  Consult Note Date of Service: 03/22/2016 9:01 AM  Related encounter: ED to Hosp-Admission (Discharged) from 03/20/2016 in [facility] All Collapse All   '[]' Hide copied text '[]' Hover for attribution information      Physical Medicine and Rehabilitation Consult   Reason for Consult: Balance deficits, dysarthria, dysphagia and expressive deficits Referring Physician: Dr. Ree Kida    HPI: Susan Patel is a 75 y.o. female with history of T2DM with neuropathy, HTN, fibromyalgia--chronic pain, COPD--oxygen at nights, right visual deficits (negative work up) who was admitted on 03/20/16 with confusion, left facial droop with slurred speech and word finding deficits as well as difficulty walking. Last seen normal the night before going to bed. 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 Neurology consulted and recommended resuming ASA as patient not compliant with ASA. Work up in progress.    Speech therapy evaluation with moderate dysarthria with mild word finding deficits and mild pharyngeal dysphagia with trace aspiration of thins--placed on dysphagia 3, nectar liquids. Patient with resultant, balance deficits, visual deficits, poor safety awareness with impulsivity affecting safety with mobility. PT/OT/ST evaluations done yesterday and CIR recommended for follow up therapy.   PTA: Sedentary but ambulated without AD. Has not driven lately and goes out of house once a week.  Husband retired and assists with home management and meals.   Review of Systems  HENT: Negative for hearing loss and tinnitus.   Eyes: Positive for blurred vision (right eye).  Respiratory: Negative for cough and shortness of breath.   Cardiovascular: Negative for chest pain and palpitations.  Gastrointestinal:  Negative for abdominal pain, heartburn and nausea.  Genitourinary: Negative for urgency.  Musculoskeletal: Positive for back pain, falls, joint pain, myalgias and neck pain.  Skin: Negative for itching and rash.  Neurological: Positive for speech change. Negative for dizziness and headaches.  Psychiatric/Behavioral: The patient is not nervous/anxious and does not have insomnia.   All other systems reviewed and are negative.         Past Medical History:  Diagnosis Date  . Arthritis    'all over"  . Asthma   . COPD (chronic obstructive pulmonary disease) (HCC)    O2 per [place] at nights   . Depression   . Diabetes mellitus without complication ([place])    borderline- , states she was on med., but MD told her "everything is under control so I threw the bottle away"  . Fibromyalgia   . GERD (gastroesophageal reflux disease)   . Hypertension   . Neuromuscular disorder (HCC)    parkinson, neuropathy- both feet & hands.         Past Surgical History:  Procedure Laterality Date  . ABDOMINAL HYSTERECTOMY    . ANTERIOR CERVICAL DECOMP/DISCECTOMY FUSION N/A 07/10/2015   Procedure: Cervical five-six, Cervical six-seven anterior cervical decompression with fusion plating and bonegraft;  Surgeon: Jovita Gamma, MD;  Location: [facility];  Service: Neurosurgery;  Laterality: N/A;  . APPENDECTOMY    . BIOPSY EYE MUSCLE  03/20/2016   biopsy vessel to right eye due to swelling  . EYE SURGERY Bilateral    cataracts removed, /w "cyrstal lenses"   . SHOULDER ARTHROSCOPY Right    x2   RCR- spurs removed          Family History  Problem Relation Age of Onset  .  Heart disease Mother   . Cancer Father   . 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 Retired Pharmacist, hospital. She reports that she has never smoked. She has never used smokeless tobacco. She reports that she does not drink alcohol or use drugs.        Allergies    Allergen Reactions  . Azithromycin Other (See Comments)    Renal failure  . Sulfamethoxazole-Trimethoprim Other (See Comments)    Renal failure  . Prednisone Other (See Comments)    Irritability and insomnia  . Ciprofloxacin Itching and Rash  . Tape Rash          Medications Prior to Admission  Medication Sig Dispense Refill  . acetaminophen (TYLENOL) 500 MG tablet Take 1,000 mg by mouth every 8 (eight) hours as needed for mild pain or moderate pain.    Marland Kitchen albuterol (PROVENTIL HFA;VENTOLIN HFA) 108 (90 Base) MCG/ACT inhaler Inhale 1-2 puffs into the lungs every 6 (six) hours as needed for wheezing or shortness of breath. 1 Inhaler 1  . aspirin EC 81 MG tablet Take 1 tablet (81 mg total) by mouth daily. 90 tablet 1  . cetirizine (ZYRTEC) 10 MG tablet Take 10 mg by mouth daily.    . cyclobenzaprine (FLEXERIL) 5 MG tablet TAKE 1 TABLET (5 MG TOTAL) BY MOUTH 3 (THREE) TIMES DAILY AS NEEDED FOR MUSCLE SPASMS. 60 tablet 3  . DULoxetine (CYMBALTA) 60 MG capsule Take 60 mg by mouth daily.     . Esomeprazole Magnesium (NEXIUM PO) Take 1 capsule by mouth daily.    . fluticasone (FLONASE) 50 MCG/ACT nasal spray Place 2 sprays into both nostrils daily. (Patient taking differently: Place 2 sprays into both nostrils daily as needed for allergies. ) 54 g 0  . fluticasone furoate-vilanterol (BREO ELLIPTA) 200-25 MCG/INH AEPB Inhale 1 puff into the lungs daily.    Marland Kitchen glimepiride (AMARYL) 2 MG tablet Take 2 mg by mouth daily with breakfast.     . HYDROcodone-acetaminophen (NORCO/VICODIN) 5-325 MG tablet Take 1 tablet by mouth every 12 (twelve) hours as needed for moderate pain. 60 tablet 0  . latanoprost (XALATAN) 0.005 % ophthalmic solution Place 1 drop into both eyes once a week.  3  . losartan (COZAAR) 100 MG tablet Take 1 tablet (100 mg total) by mouth daily. 90 tablet 3  . mirabegron ER (MYRBETRIQ) 50 MG TB24 tablet Take 50 mg by mouth daily.    . montelukast (SINGULAIR) 10 MG  tablet Take 10 mg by mouth at bedtime.   3  . OXYGEN Inhale 3-4 L into the lungs at bedtime.    . polyethylene glycol powder (GLYCOLAX/MIRALAX) powder TAKE 1 CAPFUL IN 8 OUNCES OF WATER JUICE OR TEA AND DRINK DAILY (Patient taking differently: TAKE 1 CAPFUL IN 8 OUNCES OF WATER JUICE OR TEA AND DRINK DAILY AS NEEDED FOR CONSTIPATION) 255 g 5  . traZODone (DESYREL) 50 MG tablet TAKE 1 TABLET (50 MG TOTAL) BY MOUTH NIGHTLY AS NEEDED FOR UP TO 30 DAYS FOR SLEEP. (Patient taking differently: TAKE 1 TABLET (50 MG TOTAL) BY MOUTH DAILY AT BEDTIME) 90 tablet 2  . Vitamin D, Ergocalciferol, (DRISDOL) 50000 units CAPS capsule Take 50,000 Units by mouth every Monday.   3  . blood glucose meter kit and supplies KIT Dispense based on patient and insurance preference. Use up to two times daily as directed (DX: E11.9  type 2 diabetes, uncontrolled) 1 each 0  . HYDROcodone-homatropine (HYCODAN) 5-1.5 MG/5ML  syrup Take 2.5-5 mLs by mouth every 8 (eight) hours as needed for cough. (Patient not taking: Reported on 03/21/2016) 75 mL 0  . ONETOUCH VERIO test strip TEST TWICE DAILY 100 each 2  . pantoprazole (PROTONIX) 40 MG tablet Take 1 tablet (40 mg total) by mouth daily. (Patient not taking: Reported on 03/21/2016) 90 tablet 0  . rosuvastatin (CRESTOR) 40 MG tablet Take 1 tablet (40 mg total) by mouth daily. (Patient not taking: Reported on 03/21/2016) 90 tablet 1    Home: [place] expects to be discharged to:: Private residence Living Arrangements: Spouse/significant other Available Help at Discharge: Family, Available PRN/intermittently (Patient reports husband works and she is home alone. ) Type of Home: House Home Access: Ramped entrance [place]: Two level, Able to live on main level with bedroom/bathroom Bathroom Shower/Tub: Other (comment) (walk in tub) Bathroom Toilet: [place]: [place] in, [employer] - tub/shower, Radio producer - single point  Lives With:  Spouse  Functional History: Prior Function Level of Independence: Independent Comments: Occasional use of cane for mobility. Functional Status:  Mobility: Bed Mobility Overal bed mobility: Needs Assistance Bed Mobility: Supine to Sit, Sit to Supine Supine to sit: Min assist Sit to supine: Min guard General bed mobility comments: Verbal cues to push trunk up from bed with UE's.  Assist to steady trunk during transition.  Once upright, patient with good balance. Transfers Overall transfer level: Needs assistance Equipment used: Rolling walker (2 wheeled) Transfers: Sit to/from Stand Sit to Stand: Min assist General transfer comment: Min assist to boost up from EOB and for balance in standing. Ambulation/Gait Ambulation/Gait assistance: Min assist Ambulation Distance (Feet): 110 Feet Assistive device: Rolling walker (2 wheeled) Gait Pattern/deviations: Step-through pattern, Decreased stride length, Drifts right/left General Gait Details: Verbal cues (repeated) for safe use of RW.  Cues to move at safe speed.  Patient impulsive, letting go of RW with 1 hand and continuing to walk.  Drifting to both sides even with RW.  Distracted by people in hallway, requiring cues to attend to task.      ADL: ADL Overall ADL's : Needs assistance/impaired Grooming: Min guard, Sitting Upper Body Bathing: Min guard, Sitting Lower Body Bathing: Minimal assistance, Sit to/from stand Upper Body Dressing : Min guard, Sitting Lower Body Dressing: Minimal assistance, Sit to/from stand Lower Body Dressing Details (indicate cue type and reason): Pt able to don pants and pull up in standing but required min assist to steady due to balance deficits. Toilet Transfer: Moderate assistance, Ambulation, BSC (hand held assist) Toilet Transfer Details (indicate cue type and reason): Simulated by sit to stand from EOB with functional mobility in room. Functional mobility during ADLs: Moderate assistance General ADL  Comments: Pt with difficutly with word finding and noted to have difficulty managing secretions; coughing frequently.   Cognition: Cognition Overall Cognitive Status: Impaired/Different from baseline Arousal/Alertness: Awake/alert Orientation Level: Oriented X4 Cognition Arousal/Alertness: Awake/alert Behavior During Therapy: WFL for tasks assessed/performed Overall Cognitive Status: Impaired/Different from baseline Area of Impairment: Safety/judgement, Problem solving Safety/Judgement: Decreased awareness of safety, Decreased awareness of deficits Problem Solving: Slow processing, Difficulty sequencing, Requires verbal cues General Comments: Patient impulsive, moving prior to equipment being readied.  Lets go of RW during gait to brush down her hair - continues walking.  Difficulty processing steps for turning with RW, backing up to bed, and sitting.  Cues to reach back for bed, but patient plopped onto bed in uncontrolled manner.   Blood pressure (!) 180/55,  pulse 64, temperature 97.7 F (36.5 C), temperature source Oral, resp. rate 20, height '5\' 1"'  (1.549 m), weight 81.6 kg (180 lb), SpO2 100 %. Physical Exam  Nursing note and vitals reviewed. Constitutional: She is oriented to person, place, and time. She appears well-developed and well-nourished.  HENT:  Head: Normocephalic and atraumatic.  Mouth/Throat: Oropharynx is clear and moist.  Eyes: Conjunctivae and EOM are normal. Pupils are equal, round, and reactive to light.  Neck: Normal range of motion. Neck supple.  Cardiovascular: Normal rate and regular rhythm.   Respiratory: Breath sounds normal. Stridor present. No respiratory distress. She has no wheezes.  GI: Soft. Bowel sounds are normal. She exhibits no distension. There is no tenderness.  Musculoskeletal: She exhibits no edema or tenderness.  Neurological: She is alert and oriented to person, place, and time.  Moderate dysarthria with slow speech.  Able to follow  basic commands without difficulty.  Motor: B/l UE 4/5 proximal to distal RLE: HF 4/5, KE 4/5, ADF/PF 4+/5 LLE: HF 4+/5, KE 4+/5, ADF/PF 4+/5 Sensation diminished to light touch RLE   Skin: Skin is warm and dry.  Psychiatric: Her affect is blunt. Her speech is delayed. She is slowed.    Lab Results Last 24 Hours       Results for orders placed or performed during the hospital encounter of 03/20/16 (from the past 24 hour(s))  Glucose, capillary     Status: Abnormal   Collection Time: 03/21/16 12:37 PM  Result Value Ref Range   Glucose-Capillary 127 (H) 65 - 99 mg/dL  Glucose, capillary     Status: Abnormal   Collection Time: 03/21/16  6:03 PM  Result Value Ref Range   Glucose-Capillary 141 (H) 65 - 99 mg/dL  Glucose, capillary     Status: Abnormal   Collection Time: 03/21/16  9:40 PM  Result Value Ref Range   Glucose-Capillary 177 (H) 65 - 99 mg/dL  TSH     Status: None   Collection Time: 03/22/16  6:31 AM  Result Value Ref Range   TSH 3.699 0.350 - 4.500 uIU/mL  Vitamin B12     Status: None   Collection Time: 03/22/16  6:31 AM  Result Value Ref Range   Vitamin B-12 776 180 - 914 pg/mL  Sedimentation rate     Status: Abnormal   Collection Time: 03/22/16  6:31 AM  Result Value Ref Range   Sed Rate 42 (H) 0 - 22 mm/hr  C-reactive protein     Status: None   Collection Time: 03/22/16  6:31 AM  Result Value Ref Range   CRP 0.9 <1.0 mg/dL  Glucose, capillary     Status: Abnormal   Collection Time: 03/22/16  8:25 AM  Result Value Ref Range   Glucose-Capillary 105 (H) 65 - 99 mg/dL     Ct Head Wo Contrast  Result Date: 03/20/2016 CLINICAL DATA:  Left-sided facial droop with slurred speech, initial encounter EXAM: CT HEAD WITHOUT CONTRAST TECHNIQUE: Contiguous axial images were obtained from the base of the skull through the vertex without intravenous contrast. COMPARISON:  None. FINDINGS: Brain: No evidence of acute infarction, hemorrhage, hydrocephalus,  extra-axial collection or mass lesion/mass effect. Atrophic and chronic white matter ischemic changes are noted. Vascular: No hyperdense vessel or unexpected calcification. Skull: Normal. Negative for fracture or focal lesion. Sinuses/Orbits: No acute finding. Other: None. IMPRESSION: Chronic atrophic and ischemic changes without acute infarct. Electronically Signed   By: Inez Catalina M.D.   On: 03/20/2016 13:22  Mr Brain Wo Contrast  Result Date: 03/21/2016 CLINICAL DATA:  Facial droop with slurred speech. EXAM: MRI HEAD WITHOUT CONTRAST MRA HEAD WITHOUT CONTRAST TECHNIQUE: Multiplanar, multiecho pulse sequences of the brain and surrounding structures were obtained without intravenous contrast. Angiographic images of the head were obtained using MRA technique without contrast. COMPARISON:  Head CT 03/20/2016 FINDINGS: MRI HEAD FINDINGS Brain: There is a small acute infarct in the posterior limb of the left internal capsule. There is no evidence of intracranial hemorrhage, mass, midline shift, or extra-axial fluid collection. There is moderate cerebral atrophy. Chronic lacunar infarcts are present in the right lentiform nucleus, left thalamus, corpus callosum, and in the periventricular white matter bilaterally. Cerebral white matter T2 hyperintensities are compatible with extensive chronic small vessel ischemic disease. Mild chronic small vessel ischemic changes are present in the pons. Vascular: Major intracranial vascular flow voids are preserved. Skull and upper cervical spine: No suspicious osseous lesion identified. Prior anterior cervical fusion. Sinuses/Orbits: Prior bilateral cataract extraction. Trace left mastoid effusion. Clear paranasal sinuses. Other: None. MRA HEAD FINDINGS The visualized distal vertebral arteries are patent to the basilar with the right being mildly dominant. There is mild irregularity an up to mild narrowing of both vertebral arteries. Right AICA appears dominant. PICAs are  not clearly identified. SCA origins are patent. Basilar artery is patent without significant stenosis. The posterior communicating arteries are not identified. There are severe left P1 and and bilateral P2 stenoses with right greater than left distal PCA attenuation. The internal carotid arteries are patent from skullbase to carotid termini with mild right and moderate left supraclinoid segment stenoses. A1 and M1 segments are patent without evidence of significant stenosis. There are moderate proximal M2 stenoses bilaterally. There are also moderate to severe distal A2/ proximal A3 stenoses. No intracranial aneurysm is identified. IMPRESSION: 1. Acute left internal capsule infarct. 2. Extensive chronic small vessel ischemic disease with numerous chronic lacunar infarcts. 3. No large vessel occlusion. 4. Intracranial atherosclerosis as above including mild bilateral vertebral, severe bilateral PCA, mild right ICA, and moderate left ICA stenoses. Electronically Signed   By: Logan Bores M.D.   On: 03/21/2016 07:23                 Mr Jodene Nam Head/brain DX Cm  Result Date: 03/21/2016 CLINICAL DATA:  Facial droop with slurred speech. EXAM: MRI HEAD WITHOUT CONTRAST MRA HEAD WITHOUT CONTRAST TECHNIQUE: Multiplanar, multiecho pulse sequences of the brain and surrounding structures were obtained without intravenous contrast. Angiographic images of the head were obtained using MRA technique without contrast. COMPARISON:  Head CT 03/20/2016 FINDINGS: MRI HEAD FINDINGS Brain: There is a small acute infarct in the posterior limb of the left internal capsule. There is no evidence of intracranial hemorrhage, mass, midline shift, or extra-axial fluid collection. There is moderate cerebral atrophy. Chronic lacunar infarcts are present in the right lentiform nucleus, left thalamus, corpus callosum, and in the periventricular white matter bilaterally. Cerebral white matter T2 hyperintensities are compatible with extensive  chronic small vessel ischemic disease. Mild chronic small vessel ischemic changes are present in the pons. Vascular: Major intracranial vascular flow voids are preserved. Skull and upper cervical spine: No suspicious osseous lesion identified. Prior anterior cervical fusion. Sinuses/Orbits: Prior bilateral cataract extraction. Trace left mastoid effusion. Clear paranasal sinuses. Other: None. MRA HEAD FINDINGS The visualized distal vertebral arteries are patent to the basilar with the right being mildly dominant. There is mild irregularity an up to mild narrowing of both vertebral arteries. Right AICA  appears dominant. PICAs are not clearly identified. SCA origins are patent. Basilar artery is patent without significant stenosis. The posterior communicating arteries are not identified. There are severe left P1 and and bilateral P2 stenoses with right greater than left distal PCA attenuation. The internal carotid arteries are patent from skullbase to carotid termini with mild right and moderate left supraclinoid segment stenoses. A1 and M1 segments are patent without evidence of significant stenosis. There are moderate proximal M2 stenoses bilaterally. There are also moderate to severe distal A2/ proximal A3 stenoses. No intracranial aneurysm is identified. IMPRESSION: 1. Acute left internal capsule infarct. 2. Extensive chronic small vessel ischemic disease with numerous chronic lacunar infarcts. 3. No large vessel occlusion. 4. Intracranial atherosclerosis as above including mild bilateral vertebral, severe bilateral PCA, mild right ICA, and moderate left ICA stenoses. Electronically Signed   By: Logan Bores M.D.   On: 03/21/2016 07:23    Assessment/Plan: Diagnosis: acute left internal capsule infarct Labs and images independently reviewed.  Records reviewed and summated above. Stroke: Continue secondary stroke prophylaxis and Risk Factor Modification listed below:   Antiplatelet therapy:   Blood  Pressure Management:  Continue current medication with prn's with permisive HTN per primary team Statin Agent:   Diabetes management:   Right sided hemiparesis Motor recovery: Fluoxetine  1. Does the need for close, 24 hr/day medical supervision in concert with the patient's rehab needs make it unreasonable for this patient to be served in a less intensive setting? Yes  2. Co-Morbidities requiring supervision/potential complications: [id number] with neuropathy (Monitor in accordance with exercise and adjust meds as necessary), HTN (monitor and provide prns in accordance with increased physical exertion and pain), fibromyalgia--chronic pain (Biofeedback training with therapies to help reduce reliance on opiate pain medications, monitor pain control during therapies, and sedation at rest and titrate to maximum efficacy to ensure participation and gains in therapies), COPD (supplemental O2 qhs, monitor RR and O2 sats with increased activity), right visual deficits (negative work up), medication non compliance (educate), balance deficits, dysarthria, dysphagia (SLP, advance diet as tolerated), expressive deficits, obesity (Body mass index is 34.01 kg/m.,  diet and exercise education, encourage weight loss to increase endurance and promote overall health), AKI (avoid nephrotoxic meds), ABLA (transfuse if necessary to ensure appropriate perfusion for increased activity tolerance), ?temporal arteritis (cont workup, treat if necessary) 3. Due to bladder management, safety, disease management, medication administration and patient education, does the patient require 24 hr/day rehab nursing? Yes 4. Does the patient require coordinated care of a physician, rehab nurse, PT (1-2 hrs/day, 5 days/week), OT (1-2 hrs/day, 5 days/week) and SLP (1-2 hrs/day, 5 days/week) to address physical and functional deficits in the context of the above medical diagnosis(es)? Yes Addressing deficits in the following areas: balance,  endurance, locomotion, strength, transferring, bathing, dressing, toileting, cognition, speech, swallowing and psychosocial support 5. Can the patient actively participate in an intensive therapy program of at least 3 hrs of therapy per day at least 5 days per week? Yes 6. The potential for patient to make measurable gains while on inpatient rehab is excellent 7. Anticipated functional outcomes upon discharge from inpatient rehab are modified independent and supervision  with PT, modified independent and supervision with OT, independent and modified independent with SLP. 8. Estimated rehab length of stay to reach the above functional goals is: 10-14 days. 9. Does the patient have adequate social supports and living environment to accommodate these discharge functional goals? Yes 10. Anticipated D/C setting: Home 11. Anticipated post D/C  treatments: HH therapy and Home excercise program 12. Overall Rehab/Functional Prognosis: good  RECOMMENDATIONS: This patient's condition is appropriate for continued rehabilitative care in the following setting: CIR after completion of medical workup Patient has agreed to participate in recommended program. Yes Note that insurance prior authorization may be required for reimbursement for recommended care.  Comment: Rehab Admissions Coordinator to follow up.  Delice Lesch, MD, [facility] 03/22/2016    Revision History                        Routing History

## 2016-03-23 NOTE — Care Management Note (Signed)
Case Management Note  Patient Details  Name: Susan Patel MRN: [id number] Date of Birth: 1940/09/07  Subjective/Objective:                 Patient from home admitted with stroke.   Action/Plan:  DC to CIR today.   Expected Discharge Date:                  Expected Discharge Plan:  IP Rehab Facility  In-House Referral:  Clinical Social Work  Discharge planning Services  CM Consult  Post Acute Care Choice:    Choice offered to:     DME Arranged:    DME Agency:     HH Arranged:    [facility] Agency:     Status of Service:  Completed, signed off  If discussed at [employer], dates discussed:    Additional Comments:  Carles Collet, RN 03/23/2016, 10:44 AM

## 2016-03-23 NOTE — H&P (View-Only) (Signed)
Physical Medicine and Rehabilitation Admission H&P    Chief Complaint  Patient presents with  . Balance deficits, dysarthria, dysphagia and expressive deficits    HPI:  Susan Patel is a 75 y.o. female with history of T2DM with neuropathy, HTN, fibromyalgia--chronic pain, COPD--oxygen at nights, right visual deficits (negative work up) who was admitted on 03/20/16 with confusion, right facial droop with slurred speech and word finding deficits as well as difficulty walking. Last seen normal the night before going to bed. 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 Neurology consulted and recommended resuming ASA as patient not compliant with ASA. Work up in progress.    Speech therapy evaluation with moderate dysarthria with mild word finding deficits and mild pharyngeal dysphagia with trace aspiration of thins--placed on dysphagia 3, nectar liquids. Patient with resultant, balance deficits, visual deficits, decreased attention, poor safety awareness with impulsivity affecting safety with mobility. Therapy initiated and and CIR recommended for follow up therapy.    Review of Systems  HENT: Negative for hearing loss and tinnitus.   Eyes: Positive for blurred vision (right eye with chronic issues).  Respiratory: Negative for cough and shortness of breath.   Cardiovascular: Negative for chest pain, palpitations and leg swelling.  Gastrointestinal: Negative for constipation, heartburn and nausea.  Genitourinary: Negative for dysuria and frequency.  Musculoskeletal: Positive for myalgias.  Skin: Negative for rash.  Neurological: Positive for speech change. Negative for dizziness, sensory change and headaches.  Psychiatric/Behavioral: The patient is not nervous/anxious and does not have insomnia.       Past Medical History:  Diagnosis Date  . Arthritis    'all over"  . Asthma   . COPD (chronic  obstructive pulmonary disease) (HCC)    O2 per [place] at nights   . Depression   . Diabetes mellitus without complication ([place])    borderline- , states she was on med., but MD told her "everything is under control so I threw the bottle away"  . Fibromyalgia   . GERD (gastroesophageal reflux disease)   . Hypertension   . Neuromuscular disorder (HCC)    parkinson, neuropathy- both feet & hands.    Past Surgical History:  Procedure Laterality Date  . ABDOMINAL HYSTERECTOMY    . ANTERIOR CERVICAL DECOMP/DISCECTOMY FUSION N/A 07/10/2015   Procedure: Cervical five-six, Cervical six-seven anterior cervical decompression with fusion plating and bonegraft;  Surgeon: Jovita Gamma, MD;  Location: [facility];  Service: Neurosurgery;  Laterality: N/A;  . APPENDECTOMY    . BIOPSY EYE MUSCLE  03/20/2016   biopsy vessel to right eye due to swelling  . EYE SURGERY Bilateral    cataracts removed, /w "cyrstal lenses"   . SHOULDER ARTHROSCOPY Right    x2   RCR- spurs removed     Family History  Problem Relation Age of Onset  . Heart disease Mother   . Cancer Father   . Heart disease Father     Social History: Married-husband retired Company secretary. Retired Pharmacist, hospital. She reports that she has never smoked. She has never used smokeless tobacco. She reports that she does not drink alcohol or use drugs.    Allergies  Allergen Reactions  . Azithromycin Other (See Comments)    Renal failure  . Sulfamethoxazole-Trimethoprim Other (See Comments)    Renal failure  . Prednisone Other (See Comments)    Irritability and insomnia  . Ciprofloxacin Itching and Rash  . Tape Rash  Medications Prior to Admission  Medication Sig Dispense Refill  . acetaminophen (TYLENOL) 500 MG tablet Take 1,000 mg by mouth every 8 (eight) hours as needed for mild pain or moderate pain.    Marland Kitchen albuterol (PROVENTIL HFA;VENTOLIN HFA) 108 (90 Base) MCG/ACT inhaler Inhale 1-2 puffs into the lungs every 6 (six) hours as needed for  wheezing or shortness of breath. 1 Inhaler 1  . aspirin EC 81 MG tablet Take 1 tablet (81 mg total) by mouth daily. 90 tablet 1  . cetirizine (ZYRTEC) 10 MG tablet Take 10 mg by mouth daily.    . cyclobenzaprine (FLEXERIL) 5 MG tablet TAKE 1 TABLET (5 MG TOTAL) BY MOUTH 3 (THREE) TIMES DAILY AS NEEDED FOR MUSCLE SPASMS. 60 tablet 3  . DULoxetine (CYMBALTA) 60 MG capsule Take 60 mg by mouth daily.     . Esomeprazole Magnesium (NEXIUM PO) Take 1 capsule by mouth daily.    . fluticasone (FLONASE) 50 MCG/ACT nasal spray Place 2 sprays into both nostrils daily. (Patient taking differently: Place 2 sprays into both nostrils daily as needed for allergies. ) 54 g 0  . fluticasone furoate-vilanterol (BREO ELLIPTA) 200-25 MCG/INH AEPB Inhale 1 puff into the lungs daily.    Marland Kitchen glimepiride (AMARYL) 2 MG tablet Take 2 mg by mouth daily with breakfast.     . HYDROcodone-acetaminophen (NORCO/VICODIN) 5-325 MG tablet Take 1 tablet by mouth every 12 (twelve) hours as needed for moderate pain. 60 tablet 0  . latanoprost (XALATAN) 0.005 % ophthalmic solution Place 1 drop into both eyes once a week.  3  . mirabegron ER (MYRBETRIQ) 50 MG TB24 tablet Take 50 mg by mouth daily.    . montelukast (SINGULAIR) 10 MG tablet Take 10 mg by mouth at bedtime.   3  . OXYGEN Inhale 3-4 L into the lungs at bedtime.    . polyethylene glycol powder (GLYCOLAX/MIRALAX) powder TAKE 1 CAPFUL IN 8 OUNCES OF WATER JUICE OR TEA AND DRINK DAILY (Patient taking differently: TAKE 1 CAPFUL IN 8 OUNCES OF WATER JUICE OR TEA AND DRINK DAILY AS NEEDED FOR CONSTIPATION) 255 g 5  . traZODone (DESYREL) 50 MG tablet TAKE 1 TABLET (50 MG TOTAL) BY MOUTH NIGHTLY AS NEEDED FOR UP TO 30 DAYS FOR SLEEP. (Patient taking differently: TAKE 1 TABLET (50 MG TOTAL) BY MOUTH DAILY AT BEDTIME) 90 tablet 2  . Vitamin D, Ergocalciferol, (DRISDOL) 50000 units CAPS capsule Take 50,000 Units by mouth every Monday.   3  . [DISCONTINUED] losartan (COZAAR) 100 MG tablet Take  1 tablet (100 mg total) by mouth daily. 90 tablet 3  . blood glucose meter kit and supplies KIT Dispense based on patient and insurance preference. Use up to two times daily as directed (DX: E11.9  type 2 diabetes, uncontrolled) 1 each 0  . HYDROcodone-homatropine (HYCODAN) 5-1.5 MG/5ML syrup Take 2.5-5 mLs by mouth every 8 (eight) hours as needed for cough. (Patient not taking: Reported on 03/21/2016) 75 mL 0  . ONETOUCH VERIO test strip TEST TWICE DAILY 100 each 2  . pantoprazole (PROTONIX) 40 MG tablet Take 1 tablet (40 mg total) by mouth daily. (Patient not taking: Reported on 03/21/2016) 90 tablet 0  . rosuvastatin (CRESTOR) 40 MG tablet Take 1 tablet (40 mg total) by mouth daily. (Patient not taking: Reported on 03/21/2016) 90 tablet 1    Home: [place] expects to be discharged to:: Private residence Living Arrangements: Spouse/significant other Available Help at Discharge: Family, Available PRN/intermittently (Patient reports husband works  and she is home alone. ) Type of Home: House Home Access: Ramped entrance Home Layout: Two level, Able to live on main level with bedroom/bathroom Bathroom Shower/Tub: Other (comment) (walk in tub) Bathroom Toilet: Standard Home Equipment: Shower seat - built in, [employer] - tub/shower, Radio producer - single point  Lives With: Spouse   Functional History: Prior Function Level of Independence: Independent Comments: Occasional use of cane for mobility.  Functional Status:  Mobility: Bed Mobility Overal bed mobility: Needs Assistance Bed Mobility: Supine to Sit, Sit to Supine Supine to sit: Min assist Sit to supine: Min guard General bed mobility comments: cuing for less taxing technique (pt pulling up to sit directly from supine); she was unable to attend to instructions and continued struggling to come to sitting at EOB, reaching for PT to pull her to sitting and then falling back towards mattress Transfers Overall transfer level:  Needs assistance Equipment used: 1 person hand held assist, None Transfers: Sit to/from Stand Sit to Stand: Min assist General transfer comment: Min assist to boost up from EOB and for balance in standing.; pt leaning excessively forward as coming to stand; improved on second and third trials Ambulation/Gait Ambulation/Gait assistance: Min assist Ambulation Distance (Feet): 110 Feet Assistive device: None Gait Pattern/deviations: Step-through pattern, Decreased stride length, Staggering right, Drifts right/left General Gait Details: Attempted without RW as she appeared to have difficulty using properly 11/5. She became very distracted in hallway with multiple losses of balance to her right.  Gait velocity interpretation: Below normal speed for age/gender    ADL: ADL Overall ADL's : Needs assistance/impaired Grooming: Min guard, Sitting Upper Body Bathing: Min guard, Sitting Lower Body Bathing: Minimal assistance, Sit to/from stand Upper Body Dressing : Min guard, Sitting Lower Body Dressing: Minimal assistance, Sit to/from stand Lower Body Dressing Details (indicate cue type and reason): Pt able to don pants and pull up in standing but required min assist to steady due to balance deficits. Toilet Transfer: Moderate assistance, Ambulation, BSC (hand held assist) Toilet Transfer Details (indicate cue type and reason): Simulated by sit to stand from EOB with functional mobility in room. Functional mobility during ADLs: Moderate assistance General ADL Comments: Pt with difficutly with word finding and noted to have difficulty managing secretions; coughing frequently.   Cognition: Cognition Overall Cognitive Status: Impaired/Different from baseline Arousal/Alertness: Awake/alert Orientation Level: Oriented to person, Oriented to place, Disoriented to time Cognition Arousal/Alertness: Awake/alert Behavior During Therapy: Flat affect Overall Cognitive Status: Impaired/Different from  baseline Area of Impairment: Safety/judgement, Attention, Following commands Current Attention Level: Sustained (difficulty focusing in distracting environment) Following Commands: Follows one step commands inconsistently (especially in busier environment) Safety/Judgement: Decreased awareness of safety, Decreased awareness of deficits Problem Solving: Slow processing, Difficulty sequencing, Requires verbal cues, Requires tactile cues General Comments: Patient perseverating on brushing a particular tooth; couldn't attend to PT instructions while walking in hall (required tactile cues to redirect); stafggering LOB x 4 in hall and when asked if she lost her balance while walking "no"   Blood pressure (!) 142/38, pulse 72, temperature 98.2 F (36.8 C), temperature source Oral, resp. rate 20, height '5\' 1"'  (1.549 m), weight 81.6 kg (180 lb), SpO2 94 %. Physical Exam  Constitutional: She appears well-developed.  HENT:  Head: Normocephalic and atraumatic.  Eyes: Conjunctivae are normal. Pupils are equal, round, and reactive to light.  Neck: No tracheal deviation present. No thyromegaly present.  Cardiovascular: Normal rate and intact distal pulses.   Respiratory: Effort normal. No respiratory distress. She  has no wheezes.  GI: She exhibits no distension. There is no tenderness. There is no rebound.  Musculoskeletal:  Alert. Oriented to name, person, that she had a stroke. Right central 7 and mild tongue deviation. Speech dysarthric. Needs extra time for swallowing. Speech volume low. Delays in processing and initiation although was feeding self fairly well. RUE 4/5 with mild intentional tremor. RLE 4/5 prox to distal. LUE and LLE grossly 4+/5. Senses pain in all 4. Good sitting balance.   Psychiatric:  Flat but cooperative    Results for orders placed or performed during the hospital encounter of 03/20/16 (from the past 48 hour(s))  Glucose, capillary     Status: Abnormal   Collection Time:  03/21/16 12:37 PM  Result Value Ref Range   Glucose-Capillary 127 (H) 65 - 99 mg/dL  Glucose, capillary     Status: Abnormal   Collection Time: 03/21/16  6:03 PM  Result Value Ref Range   Glucose-Capillary 141 (H) 65 - 99 mg/dL  Glucose, capillary     Status: Abnormal   Collection Time: 03/21/16  9:40 PM  Result Value Ref Range   Glucose-Capillary 177 (H) 65 - 99 mg/dL  TSH     Status: None   Collection Time: 03/22/16  6:31 AM  Result Value Ref Range   TSH 3.699 0.350 - 4.500 uIU/mL    Comment: Performed by a 3rd Generation assay with a functional sensitivity of <=0.01 uIU/mL.  Vitamin B12     Status: None   Collection Time: 03/22/16  6:31 AM  Result Value Ref Range   Vitamin B-12 776 180 - 914 pg/mL    Comment: (NOTE) This assay is not validated for testing neonatal or myeloproliferative syndrome specimens for Vitamin B12 levels.   Sedimentation rate     Status: Abnormal   Collection Time: 03/22/16  6:31 AM  Result Value Ref Range   Sed Rate 42 (H) 0 - 22 mm/hr  C-reactive protein     Status: None   Collection Time: 03/22/16  6:31 AM  Result Value Ref Range   CRP 0.9 <1.0 mg/dL  Glucose, capillary     Status: Abnormal   Collection Time: 03/22/16  8:25 AM  Result Value Ref Range   Glucose-Capillary 105 (H) 65 - 99 mg/dL  Glucose, capillary     Status: None   Collection Time: 03/22/16 12:29 PM  Result Value Ref Range   Glucose-Capillary 99 65 - 99 mg/dL  Glucose, capillary     Status: Abnormal   Collection Time: 03/22/16  4:55 PM  Result Value Ref Range   Glucose-Capillary 198 (H) 65 - 99 mg/dL  Glucose, capillary     Status: Abnormal   Collection Time: 03/22/16  9:57 PM  Result Value Ref Range   Glucose-Capillary 103 (H) 65 - 99 mg/dL  Glucose, capillary     Status: Abnormal   Collection Time: 03/23/16  7:55 AM  Result Value Ref Range   Glucose-Capillary 120 (H) 65 - 99 mg/dL   No results found.     Medical Problem List and Plan: 1.  Functional, mobility,  and swallowing deficits secondary to left internal capsule infarct  -admit to inpatient rehab 2.  DVT Prophylaxis/Anticoagulation: Pharmaceutical: Lovenox 3. Headaches/fibromyalgia with diffuse pain/Pain Management: Used flexeril prn, hydrocodone prn and cymbalta daily at home for diffuse pain.  Will resume cymbalta.  4. Mood: LCSW to follow for evaluation and support.  5. Neuropsych: This patient is not fully capable of making decisions on her  own behalf. 6. Skin/Wound Care: Routine pressure relief measures.  7. Fluids/Electrolytes/Nutrition: Monitor I/O and offer prosource supplement for low protein calorie malnutrition.  8. Blood pressure:   Has been elevated-- to allow for adequate perfusion at this time. Will monitor and add medication as indicated.   9.T2DM: Monitor BS ac/hs. Continue to use SSI for now and resume Amaryl as intake improves and BS start trending upwards. .  10. GERD: On Nexium at home--resume PPI. 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  12. Hypokalemia: Likely dilutional. Will recheck in am.     Post Admission Physician Evaluation: 1. Functional deficits secondary  to left internal capsule infarct. 2. Patient is admitted to receive collaborative, interdisciplinary care between the physiatrist, rehab nursing staff, and therapy team. 3. Patient's level of medical complexity and substantial therapy needs in context of that medical necessity cannot be provided at a lesser intensity of care such as a SNF. 4. Patient has experienced substantial functional loss from his/her baseline which was documented above under the "Functional History" and "Functional Status" headings.  Judging by the patient's diagnosis, physical exam, and functional history, the patient has potential for functional progress which will result in measurable gains while on inpatient rehab.  These gains will be of substantial and practical use upon discharge  in facilitating mobility  and self-care at the household level. 5. Physiatrist will provide 24 hour management of medical needs as well as oversight of the therapy plan/treatment and provide guidance as appropriate regarding the interaction of the two. 6. 24 hour rehab nursing will assist with bladder management, bowel management, safety, skin/wound care, disease management, medication administration, pain management and patient education  and help integrate therapy concepts, techniques,education, etc. 7. PT will assess and treat for/with: Lower extremity strength, range of motion, stamina, balance, functional mobility, safety, adaptive techniques and equipment, NMR, family education.   Goals are: mod I. 8. OT will assess and treat for/with: ADL's, functional mobility, safety, upper extremity strength, adaptive techniques and equipment, NMR, family ed, community reintegration.   Goals are: mod I to supervision. Therapy may proceed with showering this patient. 9. SLP will assess and treat for/with: speech, swallowing, cognition.  Goals are: mod I to supervision. 10. Case Management and Social Worker will assess and treat for psychological issues and discharge planning. 11. Team conference will be held weekly to assess progress toward goals and to determine barriers to discharge. 12. Patient will receive at least 3 hours of therapy per day at least 5 days per week. 13. ELOS: 7 days       14. Prognosis:  excellent     Meredith Staggers, MD, [facility] 03/23/2016  03/23/2016

## 2016-03-23 NOTE — Progress Notes (Signed)
Retta Diones, RN Rehab Admission Coordinator Signed Physical Medicine and Rehabilitation  PMR Pre-admission Date of Service: 03/23/2016 11:54 AM  Related encounter: ED to Hosp-Admission (Discharged) from 03/20/2016 in [place]       [] Hide copied text PMR Admission Coordinator Pre-Admission Assessment  Patient: Susan Patel is an 75 y.o., female MRN: [id number] DOB: [birth year] Height: 5\' 1"  (154.9 cm) Weight: 81.6 kg (180 lb)                                                                                                                                                  Insurance Information HMO: No    PPO:       PCP:       IPA:       80/20:       OTHER:   PRIMARY:  Medicare A/B      Policy#: [id number]      Subscriber: [name] CM Name:        Phone#:       Fax#:   Pre-Cert#:        Employer: Retired Benefits:  Phone #:       Name: Checked in [place]. Date: 01/16/08     Deduct: $1316      Out of Pocket Max: none      Life Max: unlimited CIR: 100%      SNF: 100 days Outpatient: 80%     Co-Pay: 20% Home Health: 100%      Co-Pay: none DME: 80%     Co-Pay: 20% Providers: patient's choice  SECONDARY:  BCBS state health plan      Policy#: [id number]      Subscriber:  [name] CM Name:        Phone#:       Fax#:   Pre-Cert#:        Employer:  Retired Benefits:  Phone #: [phone]     Name:   Eff. Date:       Deduct:        Out of Pocket Max:        Life Max:   CIR:        SNF:   Outpatient:       Co-Pay:   Home Health:        Co-Pay:   DME:       Co-Pay:    Emergency Contact Information        Contact Information    Name Relation Home Work Mobile   [place],Wayne Spouse [phone]  [phone]     Current Medical History  Patient Admitting Diagnosis:  L IC infarct  History of Present Illness: A 75 y.o.femalewith history of T2DM with neuropathy, HTN, fibromyalgia--chronic pain, COPD--oxygen at nights,right visual  deficits (negative work up) who was admitted on 03/20/16 with confusion,  left facial droop with slurred speech and word finding deficits as well as difficulty walking. Last seen normal the night before going to bed. 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 Neurology consulted and recommended resuming ASA as patient not compliant with ASA. Work up in progress. Speech therapy evaluation with moderate dysarthria with mild word finding deficits and mild pharyngeal dysphagia with trace aspiration of thins--placed on dysphagia 3, nectar liquids. Patient with resultant, balance deficits, visual deficits, decreased attention, poor safety awareness with impulsivity affecting safety with mobility. Therapy initiated and and CIR recommended for follow up therapy.    Total: 2=NIH  Past Medical History      Past Medical History:  Diagnosis Date  . Arthritis    'all over"  . Asthma   . COPD (chronic obstructive pulmonary disease) (HCC)    O2 per [place] at nights   . Depression   . Diabetes mellitus without complication ([place])    borderline- , states she was on med., but MD told her "everything is under control so I threw the bottle away"  . Fibromyalgia   . GERD (gastroesophageal reflux disease)   . Hypertension   . Neuromuscular disorder (HCC)    parkinson, neuropathy- both feet & hands.    Family History  family history includes Cancer in her father; Heart disease in her father and mother.  Prior Rehab/Hospitalizations: No previous rehab admissions  Has the patient had major surgery during 100 days prior to admission? Yes.  Had a biopsy done day before stroke.  Current Medications   Current Facility-Administered Medications:  .  aspirin EC tablet 325 mg, 325 mg, Oral, Daily, Rosalin Hawking, MD .  enoxaparin (LOVENOX) injection 40 mg, 40 mg, Subcutaneous, Q24H, Estela Leonie Green, MD,  40 mg at 03/22/16 2153 .  fluticasone furoate-vilanterol (BREO ELLIPTA) 200-25 MCG/INH 1 puff, 1 puff, Inhalation, QAC breakfast, Erline Hau, MD, 1 puff at 03/23/16 0806 .  hydrALAZINE (APRESOLINE) injection 10 mg, 10 mg, Intravenous, Q6H PRN, Reyne Dumas, MD, 10 mg at 03/23/16 0518 .  insulin aspart (novoLOG) injection 0-5 Units, 0-5 Units, Subcutaneous, QHS, Estela Leonie Green, MD .  insulin aspart (novoLOG) injection 0-9 Units, 0-9 Units, Subcutaneous, TID WC, Estela Leonie Green, MD, 2 Units at 03/22/16 1820 .  latanoprost (XALATAN) 0.005 % ophthalmic solution 1 drop, 1 drop, Both Eyes, QHS, Estela Leonie Green, MD, 1 drop at 03/22/16 2154 .  ondansetron (ZOFRAN) injection 4 mg, 4 mg, Intravenous, Q6H PRN, Erline Hau, MD .  rosuvastatin (CRESTOR) tablet 40 mg, 40 mg, Oral, Daily, Maryann Mikhail, DO, 40 mg at 03/22/16 [id number] .  senna-docusate (Senokot-S) tablet 1 tablet, 1 tablet, Oral, QHS PRN, Erline Hau, MD .  traZODone (DESYREL) tablet 50 mg, 50 mg, Oral, QHS PRN, Rhetta Mura Schorr, NP, 50 mg at 03/22/16 2253  Patients Current Diet: DIET DYS 3 Room service appropriate? Yes; Fluid consistency: Nectar Thick  Precautions / Restrictions Precautions Precautions: Fall Precaution Comments: Impulsive Restrictions Weight Bearing Restrictions: No   Has the patient had 2 or more falls or a fall with injury in the past year?Yes.  Patient reports 20 falls.  Husband reports at least 10 falls with laceration to head and bruising.  Prior Activity Level Limited Community (1-2x/wk): Went out 2-3 X a week, was driving.  Home Assistive Devices / Equipment Home Assistive Devices/Equipment: Cane (specify quad or straight), Built-in  shower seat Home Equipment: Shower seat - built in, [employer] - tub/shower, Dike - single point  Prior Device Use: Indicate devices/aids used by the patient prior to current illness, exacerbation or  injury? Occasional cane.  Prior Functional Level Prior Function Level of Independence: Independent Comments: Occasional use of cane for mobility.  Self Care: Did the patient need help bathing, dressing, using the toilet or eating?  Independent  Indoor Mobility: Did the patient need assistance with walking from room to room (with or without device)? Independent  Stairs: Did the patient need assistance with internal or external stairs (with or without device)? Independent  Functional Cognition: Did the patient need help planning regular tasks such as shopping or remembering to take medications? Independent  Current Functional Level Cognition Arousal/Alertness: Awake/alert Overall Cognitive Status: Impaired/Different from baseline Current Attention Level: Sustained (difficulty focusing in distracting environment) Orientation Level: Oriented to person, Oriented to place, Disoriented to time Following Commands: Follows one step commands inconsistently (especially in busier environment) Safety/Judgement: Decreased awareness of safety, Decreased awareness of deficits General Comments: Patient perseverating on brushing a particular tooth; couldn't attend to PT instructions while walking in hall (required tactile cues to redirect); stafggering LOB x 4 in hall and when asked if she lost her balance while walking "no"    Extremity Assessment (includes Sensation/Coordination) Upper Extremity Assessment: Defer to OT evaluation (Decreased coordination RUE noted)  Lower Extremity Assessment: Overall WFL for tasks assessed (Decreased heel-to-shin RLE)   ADLs Overall ADL's : Needs assistance/impaired Grooming: Min guard, Sitting Upper Body Bathing: Min guard, Sitting Lower Body Bathing: Minimal assistance, Sit to/from stand Upper Body Dressing : Min guard, Sitting Lower Body Dressing: Minimal assistance, Sit to/from stand Lower Body Dressing Details (indicate cue type and reason): Pt able to  don pants and pull up in standing but required min assist to steady due to balance deficits. Toilet Transfer: Moderate assistance, Ambulation, BSC (hand held assist) Toilet Transfer Details (indicate cue type and reason): Simulated by sit to stand from EOB with functional mobility in room. Functional mobility during ADLs: Moderate assistance General ADL Comments: Pt with difficutly with word finding and noted to have difficulty managing secretions; coughing frequently.    Mobility Overal bed mobility: Needs Assistance Bed Mobility: Supine to Sit, Sit to Supine Supine to sit: Min assist Sit to supine: Min guard General bed mobility comments: cuing for less taxing technique (pt pulling up to sit directly from supine); she was unable to attend to instructions and continued struggling to come to sitting at EOB, reaching for PT to pull her to sitting and then falling back towards mattress   Transfers Overall transfer level: Needs assistance Equipment used: 1 person hand held assist, None Transfers: Sit to/from Stand Sit to Stand: Min assist General transfer comment: Min assist to boost up from EOB and for balance in standing.; pt leaning excessively forward as coming to stand; improved on second and third trials   Ambulation / Gait / Stairs / Wheelchair Mobility Ambulation/Gait Ambulation/Gait assistance: Museum/gallery curator (Feet): 110 Feet Assistive device: None Gait Pattern/deviations: Step-through pattern, Decreased stride length, Staggering right, Drifts right/left General Gait Details: Attempted without RW as she appeared to have difficulty using properly 11/5. She became very distracted in hallway with multiple losses of balance to her right.  Gait velocity interpretation: Below normal speed for age/gender   Posture / Balance Static Standing Balance Tandem Stance - Right Leg: 0 (could not achieve even modified position without LOB) Tandem Stance - Left  Leg: 0 Rhomberg - Eyes  Opened: 0 (required assist to obtain position due to LOB) Rhomberg - Eyes Closed: 0 (minguard with incr sway & step to right) Balance Overall balance assessment: Needs assistance Sitting-balance support: Feet supported, No upper extremity supported Sitting balance-Leahy Scale: Fair Standing balance support: No upper extremity supported Standing balance-Leahy Scale: Poor Standing balance comment: requires assist to achieve balanced/midline staning Tandem Stance - Right Leg: 0 (could not achieve even modified position without LOB) Tandem Stance - Left Leg: 0 Rhomberg - Eyes Opened: 0 (required assist to obtain position due to LOB) Rhomberg - Eyes Closed: 0 (minguard with incr sway & step to right) High level balance activites: Direction changes, Turns, Sudden stops, Head turns High Level Balance Comments: difficulty following challenges/instructions due to attention   Special needs/care consideration BiPAP/CPAP No CPM No Continuous Drip IV No Dialysis No      Life Vest No Oxygen Yes, on 02 at night 3L [place] at home Special Bed No Trach Size No Wound Vac (area) No     Skin No                             Bowel mgmt: Last BM 03/22/16 Bladder mgmt: Voiding WDL up in bathroom with assistance Diabetic mgmt Yes, on oral medications at home   Previous Home Environment Living Arrangements: Spouse/significant other  Lives With: Spouse Available Help at Discharge: Family, Available PRN/intermittently (Patient reports husband works and she is home alone. ) Type of Home: House Home Layout: Two level, Able to live on main level with bedroom/bathroom Home Access: Ramped entrance Bathroom Shower/Tub: Other (comment) (walk in tub) Bathroom Toilet: [place]: No  Discharge Living Setting Plans for Discharge Living Setting: Patient's home, House, Lives with (comment) (Lives with husband.) Type of Home at Discharge: House Discharge Home Layout: Two level, Able to live on main  level with bedroom/bathroom Alternate Level Stairs-Number of Steps: Flight Discharge Home Access: Stairs to enter, Ramped entrance Entrance Stairs-Number of Steps: 5 step entry.  Does have a ramp at the back entry. Does the patient have any problems obtaining your medications?: No  Social/Family/Support Systems Patient Roles: Spouse, Parent (Has a husband, son and daughter.) Contact Information: Sailer Tulk - husband Anticipated Caregiver: husband Anticipated Caregiver's Contact Information: [name] - spouse - (c) [phone] Ability/Limitations of Caregiver: Husband is retired and can assist.  Husband is a Company secretary and preaches occasionally on Sundays. Caregiver Availability: 24/7 Discharge Plan Discussed with Primary Caregiver: Yes Is Caregiver In Agreement with Plan?: Yes Does Caregiver/Family have Issues with Lodging/Transportation while Pt is in Rehab?: No   Goals/Additional Needs Patient/Family Goal for Rehab: PT/OT mod I and supervision, SLP mod I and I goals Expected length of stay: 10-14 days Cultural Considerations: [employer] holiness.  Is a Company secretary.   Dietary Needs: Dys 3, nectar thick liquids Equipment Needs: TBD Pt/Family Agrees to Admission and willing to participate: Yes Program Orientation Provided & Reviewed with Pt/Caregiver Including Roles  & Responsibilities: Yes  Decrease burden of Care through IP rehab admission: N/A  Possible need for SNF placement upon discharge: Not anticipated  Patient Condition: This patient's condition remains as documented in the consult dated 03/22/16, in which the Rehabilitation Physician determined and documented that the patient's condition is appropriate for intensive rehabilitative care in an inpatient rehabilitation facility. Stroke workup all complete per attending MD.  Will admit to inpatient rehab today.  Preadmission Screen Completed By:  Karl Bales,  Evalee Mutton, 03/23/2016 12:18  PM ______________________________________________________________________   Discussed status with Dr. Naaman Plummer on 03/23/16 at 1217 and received telephone approval for admission today.  Admission Coordinator:  Retta Diones time1217/Date11/07/17       Cosigned by: Meredith Staggers, MD at 03/23/2016 12:19 PM  Revision History

## 2016-03-23 NOTE — Progress Notes (Signed)
Physical Therapy Treatment Patient Details Name: DOCIA MAGNIFICO MRN: [id number] DOB: 1941/02/27 Today's Date: 03/23/2016    History of Present Illness 75 y.o. female with h/o HTN, DM, HLD, fibromyalgia. Pt presenting with confusion, stumbling, difficulty with word finding, and slurred speech. MRI on 11/5 positive for acute L internal capsule infarct.     PT Comments    Pt performed increased activity.  Balance/distractibility remains largest limiting factor.  Pt to d/c to CIR.    Follow Up Recommendations  CIR;Supervision/Assistance - 24 hour     Equipment Recommendations  Other (comment) (TBD)    Recommendations for Other Services Rehab consult     Precautions / Restrictions Precautions Precautions: Fall Precaution Comments: Impulsive Restrictions Weight Bearing Restrictions: No    Mobility  Bed Mobility Overal bed mobility: Needs Assistance Bed Mobility: Supine to Sit     Supine to sit: Supervision     General bed mobility comments: Cues for technique and improved trunk control during transition.  pt also able to shift weight forward to donn socks with supervision.    Transfers Overall transfer level: Needs assistance Equipment used: 1 person hand held assist;None Transfers: Sit to/from Stand Sit to Stand: Min assist         General transfer comment: Min assist to boost up from EOB and for balance in standing.; pt leaning posteriorly coming to stand; Pt pulling on PTA's hand for support to transition from sit to stand.    Ambulation/Gait Ambulation/Gait assistance: Min assist Ambulation Distance (Feet): 100 Feet (+ 131ft.) Assistive device: None;1 person hand held assist (1st trial HHA, 2nd trial no device for HHA.  ) Gait Pattern/deviations: Step-through pattern;Decreased stride length;Shuffle;Staggering right;Drifts right/left   Gait velocity interpretation: Below normal speed for age/gender General Gait Details: Pt performed increased activity with  continuation of gait training without device.  Pt requires cues for focusing and shifting weight to L.     Stairs            Wheelchair Mobility    Modified Rankin (Stroke Patients Only)       Balance Overall balance assessment: Needs assistance   Sitting balance-Leahy Scale: Fair       Standing balance-Leahy Scale: Poor Standing balance comment: Pt required assist to maintain standing, when distracted LOB noted.   Single Leg Stance - Right Leg: 15 (held longer with B UE support) Single Leg Stance - Left Leg: 12 (held longer with B UE support.  )     [place]berg - Eyes Opened: 45 Rhomberg - Eyes Closed:  (increased sway with LOB. easily distracted.  )   High Level Balance Comments: difficulty following challenges/instructions due to attention    Cognition Arousal/Alertness: Awake/alert Behavior During Therapy: Impulsive Overall Cognitive Status: Impaired/Different from baseline Area of Impairment: Safety/judgement;Attention;Following commands   Current Attention Level: Sustained (remains to present with difficulty focusing in a distracting environment.  )   Following Commands: Follows one step commands inconsistently (especially in a busier environment.  ) Safety/Judgement: Decreased awareness of safety;Decreased awareness of deficits   Problem Solving: Slow processing;Difficulty sequencing;Requires verbal cues;Requires tactile cues General Comments: CNA handed a comb to patient in the hall and she began to comb her hair in hall and required increased assist to stop and focus on gait training.      Exercises Total Joint Exercises Knee Flexion: AROM;Both;10 reps;Supine General Exercises - Lower Extremity Hip ABduction/ADduction: AROM;Both;10 reps;Supine Heel Raises: AROM;Both;10 reps;Supine Mini-Sqauts: AROM;Both;10 reps;Supine    General Comments  Pertinent Vitals/Pain Pain Assessment: No/denies pain    Home Living                       Prior Function            PT Goals (current goals can now be found in the care plan section) Acute Rehab PT Goals Patient Stated Goal: return home Potential to Achieve Goals: Good Progress towards PT goals: Progressing toward goals    Frequency    Min 4X/week      PT Plan Current plan remains appropriate    Co-evaluation             End of Session Equipment Utilized During Treatment: Gait belt Activity Tolerance: Patient tolerated treatment well Patient left: with call bell/phone within reach;in chair;with family/visitor present;with nursing/sitter in room (CNA taking BP.  )     [id number] PT Time Calculation (min) (ACUTE ONLY): 23 min  Charges:  $Gait Training: 8-22 mins $Therapeutic Activity: 8-22 mins                    G Codes:      Cristela Blue 2016/04/05, 3:52 PM  Governor Rooks, PTA pager [phone]

## 2016-03-23 NOTE — Discharge Summary (Signed)
Physician Discharge Summary  Susan Patel MRN:[id number] DOB: 02/07/1941 DOA: 03/20/2016  PCP: Christy Hawks, FNP  Admit date: 03/20/2016 Discharge date: 03/23/2016  Time spent: 45 minutes  Recommendations for Outpatient Follow-up:  Patient will be discharged to inpatient rehab, continue physical, occupational, speech therapy.  Patient will need to follow up with primary care provider within one week of discharge.  Follow up with neurology in 6 weeks. Patient should continue medications as prescribed.  Patient should follow a dysphagia diet.   Discharge Diagnoses:  Acute CVA  Essential hypertension Diabetes mellitus, Type II Hyperlipidiemia  Discharge Condition: Stable  Diet recommendation: dysphagia   Filed Weights   03/20/16 1143  Weight: 81.6 kg (180 lb)    History of present illness:  on 03/20/2016 by Dr. Estela Hernandez Susan Patel a 75 y.o.femalewith h/o HTN, DM, HLD, fibromyalgia. Yesterday she had "a surgery to a blood vessel" in her right temple. Husband says this was done because she was having vision problems. Surgery was done at the urging of a retinal specialist. I wonder if there was a concern for GCA (temporal arteritis) altho she was never started on steroids. No records to this effect on her chart. She was last seen normal at 11:30 pm last night by her husband when they went to bed. This am when they woke up he noticed her to be confused, stumbling, having difficulty with word finding and slurred speech. He subsequently noted a left sided facial droop and brought her to the ED. Her symptoms have persisted. CT Head is without abnormality. EDP has discussed with neurohospitalist at [facility]; due to lack of neurology services and MRI at [facility] over the weekend, she will be transferred to [facility] today. TPA not given since outside window. History given mostly by husband and sister at bedside as patient remains drowsy and confused.  Hospital Course:  Acute CVA  -CT head:  unremarkable for CVA -MRI brain: Acute left internal capsule infarct.  -MRA brain: no large vessel occlusion  -LDL: 189 -Hemoglobin A1c 6.6 -Continue statin and aspirin -?concern for giant cell arteritis/temporal arteritis. S/p biopsy on 11/3. Started on prednisone. ESR 42. Neurology discontinued prednisone -Neurology consulted and appreciated- follow up in 6 weeks, With [name], NP -Carotid doppler:1-39% ICA plaquing.  Vertebral artery flow is antegrade.  -Echocardiogram: EF 50-55%, grade 1 diastolic dysfunction, NWMA, no cardiac source of emboli -PT/OT consulted, recommended CIR -Speech/swallow eval- on dysphagia 3 diet -Will discharge patient to CIR today  Essential hypertension -Allow for permissive hypertension -losartan held  -Continue ISS and CBG monitoring   Diabetes mellitus, Type II -A1c 6.6  -Glimepiride held  Hyperlipidiemia -Lipid panel: LDl 263, TG 138, LDL 189, HDL 46 -continue statin -Not sure if patient was taking her statin- husband stated she had stopped for a while.  Procedures: Echocardiogram Carotid doppler  Consultations: Neurology Inpatient rehab  Discharge Exam: Vitals:   03/23/16 0412 03/23/16 0652  BP: (!) 171/83 (!) 142/38  Pulse: 71 72  Resp: 18 20  Temp: 98.2 F (36.8 C) 98.2 F (36.8 C)   Patient has no complaints this morning.  Denies dizziness, chest pain, shortness of breath, abdominal pain, N/V/D/C.  Exam  General: Well developed, well nourished, NAD, appears stated age  HEENT: NCAT,mucous membranes moist.   Cardiovascular: S1 S2 auscultated, RRR, no murmurs  Respiratory: Clear to auscultation bilaterally with equal chest rise  Abdomen: Soft, nontender, nondistended, + bowel sounds  Extremities: warm dry without cyanosis clubbing or edema  Neuro: AAOx3,   speech slightly slurred, right facial droop, no new deficits  Psych: Normal affect and demeanor    Discharge Instructions Discharge Instructions     Ambulatory referral to Neurology    Complete by:  As directed    Follow up with NP Cecille Rubin at [facility] in about 2 months. Thanks.     Current Discharge Medication List    CONTINUE these medications which have NOT CHANGED   Details  acetaminophen (TYLENOL) 500 MG tablet Take 1,000 mg by mouth every 8 (eight) hours as needed for mild pain or moderate pain.    albuterol (PROVENTIL HFA;VENTOLIN HFA) 108 (90 Base) MCG/ACT inhaler Inhale 1-2 puffs into the lungs every 6 (six) hours as needed for wheezing or shortness of breath. Qty: 1 Inhaler, Refills: 1   Associated Diagnoses: Asthma, moderate persistent, uncomplicated    aspirin EC 81 MG tablet Take 1 tablet (81 mg total) by mouth daily. Qty: 90 tablet, Refills: 1    cetirizine (ZYRTEC) 10 MG tablet Take 10 mg by mouth daily.    cyclobenzaprine (FLEXERIL) 5 MG tablet TAKE 1 TABLET (5 MG TOTAL) BY MOUTH 3 (THREE) TIMES DAILY AS NEEDED FOR MUSCLE SPASMS. Qty: 60 tablet, Refills: 3    DULoxetine (CYMBALTA) 60 MG capsule Take 60 mg by mouth daily.     Esomeprazole Magnesium (NEXIUM PO) Take 1 capsule by mouth daily.    fluticasone (FLONASE) 50 MCG/ACT nasal spray Place 2 sprays into both nostrils daily. Qty: 54 g, Refills: 0   Associated Diagnoses: Acute bronchitis, unspecified organism    fluticasone furoate-vilanterol (BREO ELLIPTA) 200-25 MCG/INH AEPB Inhale 1 puff into the lungs daily.    glimepiride (AMARYL) 2 MG tablet Take 2 mg by mouth daily with breakfast.     HYDROcodone-acetaminophen (NORCO/VICODIN) 5-325 MG tablet Take 1 tablet by mouth every 12 (twelve) hours as needed for moderate pain. Qty: 60 tablet, Refills: 0   Associated Diagnoses: Contusion of rib on right side, initial encounter; Abrasion of head, initial encounter    latanoprost (XALATAN) 0.005 % ophthalmic solution Place 1 drop into both eyes once a week. Refills: 3    losartan (COZAAR) 100 MG tablet Take 1 tablet (100 mg total) by mouth daily. Qty: 90  tablet, Refills: 3   Associated Diagnoses: Essential hypertension    mirabegron ER (MYRBETRIQ) 50 MG TB24 tablet Take 50 mg by mouth daily.    montelukast (SINGULAIR) 10 MG tablet Take 10 mg by mouth at bedtime.  Refills: 3    OXYGEN Inhale 3-4 L into the lungs at bedtime.    polyethylene glycol powder (GLYCOLAX/MIRALAX) powder TAKE 1 CAPFUL IN 8 OUNCES OF WATER JUICE OR TEA AND DRINK DAILY Qty: 255 g, Refills: 5    traZODone (DESYREL) 50 MG tablet TAKE 1 TABLET (50 MG TOTAL) BY MOUTH NIGHTLY AS NEEDED FOR UP TO 30 DAYS FOR SLEEP. Qty: 90 tablet, Refills: 2    Vitamin D, Ergocalciferol, (DRISDOL) 50000 units CAPS capsule Take 50,000 Units by mouth every Monday.  Refills: 3    blood glucose meter kit and supplies KIT Dispense based on patient and insurance preference. Use up to two times daily as directed (DX: E11.9  type 2 diabetes, uncontrolled) Qty: 1 each, Refills: 0    HYDROcodone-homatropine (HYCODAN) 5-1.5 MG/5ML syrup Take 2.5-5 mLs by mouth every 8 (eight) hours as needed for cough. Qty: 75 mL, Refills: 0   Associated Diagnoses: Post-viral cough syndrome    ONETOUCH VERIO test strip TEST TWICE DAILY Qty: 100 each, Refills:  2    pantoprazole (PROTONIX) 40 MG tablet Take 1 tablet (40 mg total) by mouth daily. Qty: 90 tablet, Refills: 0    rosuvastatin (CRESTOR) 40 MG tablet Take 1 tablet (40 mg total) by mouth daily. Qty: 90 tablet, Refills: 1       Allergies  Allergen Reactions  . Azithromycin Other (See Comments)    Renal failure  . Sulfamethoxazole-Trimethoprim Other (See Comments)    Renal failure  . Prednisone Other (See Comments)    Irritability and insomnia  . Ciprofloxacin Itching and Rash  . Tape Rash   Follow-up Information    Dennie Bible, NP. Schedule an appointment as soon as possible for a visit in 6 week(s).   Specialty:  Family Medicine Contact information: [address] [place] [address] [phone]             The results of significant diagnostics from this hospitalization (including imaging, microbiology, ancillary and laboratory) are listed below for reference.    Significant Diagnostic Studies: Ct Head Wo Contrast  Result Date: 03/20/2016 CLINICAL DATA:  Left-sided facial droop with slurred speech, initial encounter EXAM: CT HEAD WITHOUT CONTRAST TECHNIQUE: Contiguous axial images were obtained from the base of the skull through the vertex without intravenous contrast. COMPARISON:  None. FINDINGS: Brain: No evidence of acute infarction, hemorrhage, hydrocephalus, extra-axial collection or mass lesion/mass effect. Atrophic and chronic white matter ischemic changes are noted. Vascular: No hyperdense vessel or unexpected calcification. Skull: Normal. Negative for fracture or focal lesion. Sinuses/Orbits: No acute finding. Other: None. IMPRESSION: Chronic atrophic and ischemic changes without acute infarct. Electronically Signed   By: Inez Catalina M.D.   On: 03/20/2016 13:22   Mr Brain Wo Contrast  Result Date: 03/21/2016 CLINICAL DATA:  Facial droop with slurred speech. EXAM: MRI HEAD WITHOUT CONTRAST MRA HEAD WITHOUT CONTRAST TECHNIQUE: Multiplanar, multiecho pulse sequences of the brain and surrounding structures were obtained without intravenous contrast. Angiographic images of the head were obtained using MRA technique without contrast. COMPARISON:  Head CT 03/20/2016 FINDINGS: MRI HEAD FINDINGS Brain: There is a small acute infarct in the posterior limb of the left internal capsule. There is no evidence of intracranial hemorrhage, mass, midline shift, or extra-axial fluid collection. There is moderate cerebral atrophy. Chronic lacunar infarcts are present in the right lentiform nucleus, left thalamus, corpus callosum, and in the periventricular white matter bilaterally. Cerebral white matter T2 hyperintensities are compatible with extensive chronic small vessel ischemic disease. Mild chronic small  vessel ischemic changes are present in the pons. Vascular: Major intracranial vascular flow voids are preserved. Skull and upper cervical spine: No suspicious osseous lesion identified. Prior anterior cervical fusion. Sinuses/Orbits: Prior bilateral cataract extraction. Trace left mastoid effusion. Clear paranasal sinuses. Other: None. MRA HEAD FINDINGS The visualized distal vertebral arteries are patent to the basilar with the right being mildly dominant. There is mild irregularity an up to mild narrowing of both vertebral arteries. Right AICA appears dominant. PICAs are not clearly identified. SCA origins are patent. Basilar artery is patent without significant stenosis. The posterior communicating arteries are not identified. There are severe left P1 and and bilateral P2 stenoses with right greater than left distal PCA attenuation. The internal carotid arteries are patent from skullbase to carotid termini with mild right and moderate left supraclinoid segment stenoses. A1 and M1 segments are patent without evidence of significant stenosis. There are moderate proximal M2 stenoses bilaterally. There are also moderate to severe distal A2/ proximal A3 stenoses. No intracranial aneurysm  is identified. IMPRESSION: 1. Acute left internal capsule infarct. 2. Extensive chronic small vessel ischemic disease with numerous chronic lacunar infarcts. 3. No large vessel occlusion. 4. Intracranial atherosclerosis as above including mild bilateral vertebral, severe bilateral PCA, mild right ICA, and moderate left ICA stenoses. Electronically Signed   By: Allen  Grady M.D.   On: 03/21/2016 07:23   Dg Swallowing Func-speech Pathology  Result Date: 03/21/2016 Objective Swallowing Evaluation: Type of Study: MBS-Modified Barium Swallow Study Patient Details Name: Genevive H Benn MRN: [id number] Date of Birth: 12/17/1940 Today's Date: 03/21/2016 Time: SLP Start Time (ACUTE ONLY): 1015-SLP Stop Time (ACUTE ONLY): 1030 SLP Time  Calculation (min) (ACUTE ONLY): 15 min Past Medical History: Past Medical History: Diagnosis Date . Arthritis   'all over" . Asthma  . COPD (chronic obstructive pulmonary disease) (HCC)   O2 at nasal prongs . Depression  . Diabetes mellitus without complication (HCC)   borderline- , states she was on med., but MD told her "everything is under control so I threw the bottle away" . Fibromyalgia  . GERD (gastroesophageal reflux disease)  . Hypertension  . Neuromuscular disorder (HCC)   parkinson, neuropathy- both feet & hands. Past Surgical History: Past Surgical History: Procedure Laterality Date . ABDOMINAL HYSTERECTOMY   . ANTERIOR CERVICAL DECOMP/DISCECTOMY FUSION N/A 07/10/2015  Procedure: Cervical five-six, Cervical six-seven anterior cervical decompression with fusion plating and bonegraft;  Surgeon: Robert Nudelman, MD;  Location: [facility];  Service: Neurosurgery;  Laterality: N/A; . APPENDECTOMY   . BIOPSY EYE MUSCLE  03/20/2016  biopsy vessel to right eye due to swelling . EYE SURGERY Bilateral   cataracts removed, /w "cyrstal lenses"  . SHOULDER ARTHROSCOPY Right   x2   RCR- spurs removed  HPI: 75 y.o.femalewith h/o HTN, DM, HLD, fibromyalgia, GERD, ACDF Feb 2017 admitted with symptoms of stroke. 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  Failed RN stroke swallow.  MRI left internal capsule infarct; Stroke w/u pending.  Subjective: pleasant; husband present Assessment / Plan / Recommendation CHL IP CLINICAL IMPRESSIONS 03/21/2016 Therapy Diagnosis Mild pharyngeal phase dysphagia Clinical Impression Pt presents with a mild pharyngeal phase dysphagia marked by delayed swallow response to the level of the pyriform sinuses for both nectar and thin liquids; there is immediate and consistent trace aspiration of thin liquids (liquids reach vocal folds and just beneath - triggering a cough response).  Nectar thick liquids did not penetrate the larynx.  There was good pharyngeal clearance and no  residue post-swallow.  Postural adjustments (chin tuck, head turn) were not effective in preventing aspiration.  For now, recommend initiating a dysphagia 3 diet with nectar-thick liquids.  Pt and husband viewed video and we discussed results/recommendations.  Impact on safety and function Mild aspiration risk   CHL IP TREATMENT RECOMMENDATION 03/21/2016 Treatment Recommendations Therapy as outlined in treatment plan below   Prognosis 03/21/2016 Prognosis for Safe Diet Advancement Good Barriers to Reach Goals -- Barriers/Prognosis Comment -- CHL IP DIET RECOMMENDATION 03/21/2016 SLP Diet Recommendations Dysphagia 3 (Mech soft) solids;Nectar thick liquid Liquid Administration via Cup Medication Administration Whole meds with puree Compensations Minimize environmental distractions;Slow rate;Small sips/bites Postural Changes Seated upright at 90 degrees   CHL IP OTHER RECOMMENDATIONS 03/21/2016 Recommended Consults -- Oral Care Recommendations Oral care BID Other Recommendations Order thickener from pharmacy   CHL IP FOLLOW UP RECOMMENDATIONS 03/21/2016 Follow up Recommendations (No Data)   CHL IP FREQUENCY AND DURATION 03/21/2016 Speech Therapy Frequency (ACUTE ONLY) min 2x/week Treatment Duration 1 week        CHL IP ORAL PHASE 03/21/2016 Oral Phase WFL Oral - Pudding Teaspoon -- Oral - Pudding Cup -- Oral - Honey Teaspoon -- Oral - Honey Cup -- Oral - Nectar Teaspoon -- Oral - Nectar Cup -- Oral - Nectar Straw -- Oral - Thin Teaspoon -- Oral - Thin Cup -- Oral - Thin Straw -- Oral - Puree -- Oral - Mech Soft -- Oral - Regular -- Oral - Multi-Consistency -- Oral - Pill -- Oral Phase - Comment --  CHL IP PHARYNGEAL PHASE 03/21/2016 Pharyngeal Phase Impaired Pharyngeal- Pudding Teaspoon -- Pharyngeal -- Pharyngeal- Pudding Cup -- Pharyngeal -- Pharyngeal- Honey Teaspoon -- Pharyngeal -- Pharyngeal- Honey Cup -- Pharyngeal -- Pharyngeal- Nectar Teaspoon -- Pharyngeal -- Pharyngeal- Nectar Cup Delayed swallow initiation-pyriform  sinuses Pharyngeal -- Pharyngeal- Nectar Straw -- Pharyngeal -- Pharyngeal- Thin Teaspoon -- Pharyngeal -- Pharyngeal- Thin Cup Delayed swallow initiation-pyriform sinuses;Reduced airway/laryngeal closure;Trace aspiration;Penetration/Aspiration during swallow Pharyngeal Material enters airway, passes BELOW cords and not ejected out despite cough attempt by patient Pharyngeal- Thin Straw -- Pharyngeal -- Pharyngeal- Puree WFL Pharyngeal -- Pharyngeal- Mechanical Soft -- Pharyngeal -- Pharyngeal- Regular WFL Pharyngeal -- Pharyngeal- Multi-consistency -- Pharyngeal -- Pharyngeal- Pill -- Pharyngeal -- Pharyngeal Comment --  No flowsheet data found. No flowsheet data found. Couture, Amanda Laurice 03/21/2016, 10:42 AM              Mr Mra Head/brain Wo Cm  Result Date: 03/21/2016 CLINICAL DATA:  Facial droop with slurred speech. EXAM: MRI HEAD WITHOUT CONTRAST MRA HEAD WITHOUT CONTRAST TECHNIQUE: Multiplanar, multiecho pulse sequences of the brain and surrounding structures were obtained without intravenous contrast. Angiographic images of the head were obtained using MRA technique without contrast. COMPARISON:  Head CT 03/20/2016 FINDINGS: MRI HEAD FINDINGS Brain: There is a small acute infarct in the posterior limb of the left internal capsule. There is no evidence of intracranial hemorrhage, mass, midline shift, or extra-axial fluid collection. There is moderate cerebral atrophy. Chronic lacunar infarcts are present in the right lentiform nucleus, left thalamus, corpus callosum, and in the periventricular white matter bilaterally. Cerebral white matter T2 hyperintensities are compatible with extensive chronic small vessel ischemic disease. Mild chronic small vessel ischemic changes are present in the pons. Vascular: Major intracranial vascular flow voids are preserved. Skull and upper cervical spine: No suspicious osseous lesion identified. Prior anterior cervical fusion. Sinuses/Orbits: Prior bilateral cataract  extraction. Trace left mastoid effusion. Clear paranasal sinuses. Other: None. MRA HEAD FINDINGS The visualized distal vertebral arteries are patent to the basilar with the right being mildly dominant. There is mild irregularity an up to mild narrowing of both vertebral arteries. Right AICA appears dominant. PICAs are not clearly identified. SCA origins are patent. Basilar artery is patent without significant stenosis. The posterior communicating arteries are not identified. There are severe left P1 and and bilateral P2 stenoses with right greater than left distal PCA attenuation. The internal carotid arteries are patent from skullbase to carotid termini with mild right and moderate left supraclinoid segment stenoses. A1 and M1 segments are patent without evidence of significant stenosis. There are moderate proximal M2 stenoses bilaterally. There are also moderate to severe distal A2/ proximal A3 stenoses. No intracranial aneurysm is identified. IMPRESSION: 1. Acute left internal capsule infarct. 2. Extensive chronic small vessel ischemic disease with numerous chronic lacunar infarcts. 3. No large vessel occlusion. 4. Intracranial atherosclerosis as above including mild bilateral vertebral, severe bilateral PCA, mild right ICA, and moderate left ICA stenoses. Electronically Signed   By: Allen  Grady   M.D.   On: 03/21/2016 07:23    Microbiology: No results found for this or any previous visit (from the past 240 hour(s)).   Labs: Basic Metabolic Panel:  Recent Labs Lab 03/20/16 1243 03/20/16 2010  NA 139  --   K 4.3  --   CL 105  --   CO2 29  --   GLUCOSE 130*  --   BUN 17  --   CREATININE 1.06* 0.89  CALCIUM 8.8*  --    Liver Function Tests:  Recent Labs Lab 03/20/16 1243  AST 17  ALT 15  ALKPHOS 104  BILITOT 0.4  PROT 6.4*  ALBUMIN 3.2*   No results for input(s): LIPASE, AMYLASE in the last 168 hours. No results for input(s): AMMONIA in the last 168 hours. CBC:  Recent Labs Lab  03/20/16 1243 03/20/16 2010  WBC 6.4 8.1  NEUTROABS 3.9  --   HGB 11.5* 12.7  HCT 35.5* 38.2  MCV 87.4 85.1  PLT 351 384   Cardiac Enzymes:  Recent Labs Lab 03/20/16 1243  TROPONINI <0.03   BNP: BNP (last 3 results) No results for input(s): BNP in the last 8760 hours.  ProBNP (last 3 results) No results for input(s): PROBNP in the last 8760 hours.  CBG:  Recent Labs Lab 03/22/16 0825 03/22/16 1229 03/22/16 1655 03/22/16 2157 03/23/16 0755  GLUCAP 105* 99 198* 103* 120*       Signed:  ,   [facility] 03/23/2016, 10:08 AM  

## 2016-03-23 NOTE — H&P (Signed)
Physical Medicine and Rehabilitation Admission H&P    Chief Complaint  Patient presents with  . Balance deficits, dysarthria, dysphagia and expressive deficits    HPI:  Susan Patel is a 75 y.o. female with history of T2DM with neuropathy, HTN, fibromyalgia--chronic pain, COPD--oxygen at nights, right visual deficits (negative work up) who was admitted on 03/20/16 with confusion, right facial droop with slurred speech and word finding deficits as well as difficulty walking. Last seen normal the night before going to bed. 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 Neurology consulted and recommended resuming ASA as patient not compliant with ASA. Work up in progress.    Speech therapy evaluation with moderate dysarthria with mild word finding deficits and mild pharyngeal dysphagia with trace aspiration of thins--placed on dysphagia 3, nectar liquids. Patient with resultant, balance deficits, visual deficits, decreased attention, poor safety awareness with impulsivity affecting safety with mobility. Therapy initiated and and CIR recommended for follow up therapy.    Review of Systems  HENT: Negative for hearing loss and tinnitus.   Eyes: Positive for blurred vision (right eye with chronic issues).  Respiratory: Negative for cough and shortness of breath.   Cardiovascular: Negative for chest pain, palpitations and leg swelling.  Gastrointestinal: Negative for constipation, heartburn and nausea.  Genitourinary: Negative for dysuria and frequency.  Musculoskeletal: Positive for myalgias.  Skin: Negative for rash.  Neurological: Positive for speech change. Negative for dizziness, sensory change and headaches.  Psychiatric/Behavioral: The patient is not nervous/anxious and does not have insomnia.       Past Medical History:  Diagnosis Date  . Arthritis    'all over"  . Asthma   . COPD (chronic  obstructive pulmonary disease) (HCC)    O2 per [place] at nights   . Depression   . Diabetes mellitus without complication ([place])    borderline- , states she was on med., but MD told her "everything is under control so I threw the bottle away"  . Fibromyalgia   . GERD (gastroesophageal reflux disease)   . Hypertension   . Neuromuscular disorder (HCC)    parkinson, neuropathy- both feet & hands.    Past Surgical History:  Procedure Laterality Date  . ABDOMINAL HYSTERECTOMY    . ANTERIOR CERVICAL DECOMP/DISCECTOMY FUSION N/A 07/10/2015   Procedure: Cervical five-six, Cervical six-seven anterior cervical decompression with fusion plating and bonegraft;  Surgeon: Jovita Gamma, MD;  Location: [facility];  Service: Neurosurgery;  Laterality: N/A;  . APPENDECTOMY    . BIOPSY EYE MUSCLE  03/20/2016   biopsy vessel to right eye due to swelling  . EYE SURGERY Bilateral    cataracts removed, /w "cyrstal lenses"   . SHOULDER ARTHROSCOPY Right    x2   RCR- spurs removed     Family History  Problem Relation Age of Onset  . Heart disease Mother   . Cancer Father   . Heart disease Father     Social History: Married-husband retired Company secretary. Retired Pharmacist, hospital. She reports that she has never smoked. She has never used smokeless tobacco. She reports that she does not drink alcohol or use drugs.    Allergies  Allergen Reactions  . Azithromycin Other (See Comments)    Renal failure  . Sulfamethoxazole-Trimethoprim Other (See Comments)    Renal failure  . Prednisone Other (See Comments)    Irritability and insomnia  . Ciprofloxacin Itching and Rash  . Tape Rash  Medications Prior to Admission  Medication Sig Dispense Refill  . acetaminophen (TYLENOL) 500 MG tablet Take 1,000 mg by mouth every 8 (eight) hours as needed for mild pain or moderate pain.    Marland Kitchen albuterol (PROVENTIL HFA;VENTOLIN HFA) 108 (90 Base) MCG/ACT inhaler Inhale 1-2 puffs into the lungs every 6 (six) hours as needed for  wheezing or shortness of breath. 1 Inhaler 1  . aspirin EC 81 MG tablet Take 1 tablet (81 mg total) by mouth daily. 90 tablet 1  . cetirizine (ZYRTEC) 10 MG tablet Take 10 mg by mouth daily.    . cyclobenzaprine (FLEXERIL) 5 MG tablet TAKE 1 TABLET (5 MG TOTAL) BY MOUTH 3 (THREE) TIMES DAILY AS NEEDED FOR MUSCLE SPASMS. 60 tablet 3  . DULoxetine (CYMBALTA) 60 MG capsule Take 60 mg by mouth daily.     . Esomeprazole Magnesium (NEXIUM PO) Take 1 capsule by mouth daily.    . fluticasone (FLONASE) 50 MCG/ACT nasal spray Place 2 sprays into both nostrils daily. (Patient taking differently: Place 2 sprays into both nostrils daily as needed for allergies. ) 54 g 0  . fluticasone furoate-vilanterol (BREO ELLIPTA) 200-25 MCG/INH AEPB Inhale 1 puff into the lungs daily.    Marland Kitchen glimepiride (AMARYL) 2 MG tablet Take 2 mg by mouth daily with breakfast.     . HYDROcodone-acetaminophen (NORCO/VICODIN) 5-325 MG tablet Take 1 tablet by mouth every 12 (twelve) hours as needed for moderate pain. 60 tablet 0  . latanoprost (XALATAN) 0.005 % ophthalmic solution Place 1 drop into both eyes once a week.  3  . mirabegron ER (MYRBETRIQ) 50 MG TB24 tablet Take 50 mg by mouth daily.    . montelukast (SINGULAIR) 10 MG tablet Take 10 mg by mouth at bedtime.   3  . OXYGEN Inhale 3-4 L into the lungs at bedtime.    . polyethylene glycol powder (GLYCOLAX/MIRALAX) powder TAKE 1 CAPFUL IN 8 OUNCES OF WATER JUICE OR TEA AND DRINK DAILY (Patient taking differently: TAKE 1 CAPFUL IN 8 OUNCES OF WATER JUICE OR TEA AND DRINK DAILY AS NEEDED FOR CONSTIPATION) 255 g 5  . traZODone (DESYREL) 50 MG tablet TAKE 1 TABLET (50 MG TOTAL) BY MOUTH NIGHTLY AS NEEDED FOR UP TO 30 DAYS FOR SLEEP. (Patient taking differently: TAKE 1 TABLET (50 MG TOTAL) BY MOUTH DAILY AT BEDTIME) 90 tablet 2  . Vitamin D, Ergocalciferol, (DRISDOL) 50000 units CAPS capsule Take 50,000 Units by mouth every Monday.   3  . [DISCONTINUED] losartan (COZAAR) 100 MG tablet Take  1 tablet (100 mg total) by mouth daily. 90 tablet 3  . blood glucose meter kit and supplies KIT Dispense based on patient and insurance preference. Use up to two times daily as directed (DX: E11.9  type 2 diabetes, uncontrolled) 1 each 0  . HYDROcodone-homatropine (HYCODAN) 5-1.5 MG/5ML syrup Take 2.5-5 mLs by mouth every 8 (eight) hours as needed for cough. (Patient not taking: Reported on 03/21/2016) 75 mL 0  . ONETOUCH VERIO test strip TEST TWICE DAILY 100 each 2  . pantoprazole (PROTONIX) 40 MG tablet Take 1 tablet (40 mg total) by mouth daily. (Patient not taking: Reported on 03/21/2016) 90 tablet 0  . rosuvastatin (CRESTOR) 40 MG tablet Take 1 tablet (40 mg total) by mouth daily. (Patient not taking: Reported on 03/21/2016) 90 tablet 1    Home: [place] expects to be discharged to:: Private residence Living Arrangements: Spouse/significant other Available Help at Discharge: Family, Available PRN/intermittently (Patient reports husband works  and she is home alone. ) Type of Home: House Home Access: Ramped entrance Home Layout: Two level, Able to live on main level with bedroom/bathroom Bathroom Shower/Tub: Other (comment) (walk in tub) Bathroom Toilet: Standard Home Equipment: Shower seat - built in, [employer] - tub/shower, Radio producer - single point  Lives With: Spouse   Functional History: Prior Function Level of Independence: Independent Comments: Occasional use of cane for mobility.  Functional Status:  Mobility: Bed Mobility Overal bed mobility: Needs Assistance Bed Mobility: Supine to Sit, Sit to Supine Supine to sit: Min assist Sit to supine: Min guard General bed mobility comments: cuing for less taxing technique (pt pulling up to sit directly from supine); she was unable to attend to instructions and continued struggling to come to sitting at EOB, reaching for PT to pull her to sitting and then falling back towards mattress Transfers Overall transfer level:  Needs assistance Equipment used: 1 person hand held assist, None Transfers: Sit to/from Stand Sit to Stand: Min assist General transfer comment: Min assist to boost up from EOB and for balance in standing.; pt leaning excessively forward as coming to stand; improved on second and third trials Ambulation/Gait Ambulation/Gait assistance: Min assist Ambulation Distance (Feet): 110 Feet Assistive device: None Gait Pattern/deviations: Step-through pattern, Decreased stride length, Staggering right, Drifts right/left General Gait Details: Attempted without RW as she appeared to have difficulty using properly 11/5. She became very distracted in hallway with multiple losses of balance to her right.  Gait velocity interpretation: Below normal speed for age/gender    ADL: ADL Overall ADL's : Needs assistance/impaired Grooming: Min guard, Sitting Upper Body Bathing: Min guard, Sitting Lower Body Bathing: Minimal assistance, Sit to/from stand Upper Body Dressing : Min guard, Sitting Lower Body Dressing: Minimal assistance, Sit to/from stand Lower Body Dressing Details (indicate cue type and reason): Pt able to don pants and pull up in standing but required min assist to steady due to balance deficits. Toilet Transfer: Moderate assistance, Ambulation, BSC (hand held assist) Toilet Transfer Details (indicate cue type and reason): Simulated by sit to stand from EOB with functional mobility in room. Functional mobility during ADLs: Moderate assistance General ADL Comments: Pt with difficutly with word finding and noted to have difficulty managing secretions; coughing frequently.   Cognition: Cognition Overall Cognitive Status: Impaired/Different from baseline Arousal/Alertness: Awake/alert Orientation Level: Oriented to person, Oriented to place, Disoriented to time Cognition Arousal/Alertness: Awake/alert Behavior During Therapy: Flat affect Overall Cognitive Status: Impaired/Different from  baseline Area of Impairment: Safety/judgement, Attention, Following commands Current Attention Level: Sustained (difficulty focusing in distracting environment) Following Commands: Follows one step commands inconsistently (especially in busier environment) Safety/Judgement: Decreased awareness of safety, Decreased awareness of deficits Problem Solving: Slow processing, Difficulty sequencing, Requires verbal cues, Requires tactile cues General Comments: Patient perseverating on brushing a particular tooth; couldn't attend to PT instructions while walking in hall (required tactile cues to redirect); stafggering LOB x 4 in hall and when asked if she lost her balance while walking "no"   Blood pressure (!) 142/38, pulse 72, temperature 98.2 F (36.8 C), temperature source Oral, resp. rate 20, height '5\' 1"'  (1.549 m), weight 81.6 kg (180 lb), SpO2 94 %. Physical Exam  Constitutional: She appears well-developed.  HENT:  Head: Normocephalic and atraumatic.  Eyes: Conjunctivae are normal. Pupils are equal, round, and reactive to light.  Neck: No tracheal deviation present. No thyromegaly present.  Cardiovascular: Normal rate and intact distal pulses.   Respiratory: Effort normal. No respiratory distress. She  has no wheezes.  GI: She exhibits no distension. There is no tenderness. There is no rebound.  Musculoskeletal:  Alert. Oriented to name, person, that she had a stroke. Right central 7 and mild tongue deviation. Speech dysarthric. Needs extra time for swallowing. Speech volume low. Delays in processing and initiation although was feeding self fairly well. RUE 4/5 with mild intentional tremor. RLE 4/5 prox to distal. LUE and LLE grossly 4+/5. Senses pain in all 4. Good sitting balance.   Psychiatric:  Flat but cooperative    Results for orders placed or performed during the hospital encounter of 03/20/16 (from the past 48 hour(s))  Glucose, capillary     Status: Abnormal   Collection Time:  03/21/16 12:37 PM  Result Value Ref Range   Glucose-Capillary 127 (H) 65 - 99 mg/dL  Glucose, capillary     Status: Abnormal   Collection Time: 03/21/16  6:03 PM  Result Value Ref Range   Glucose-Capillary 141 (H) 65 - 99 mg/dL  Glucose, capillary     Status: Abnormal   Collection Time: 03/21/16  9:40 PM  Result Value Ref Range   Glucose-Capillary 177 (H) 65 - 99 mg/dL  TSH     Status: None   Collection Time: 03/22/16  6:31 AM  Result Value Ref Range   TSH 3.699 0.350 - 4.500 uIU/mL    Comment: Performed by a 3rd Generation assay with a functional sensitivity of <=0.01 uIU/mL.  Vitamin B12     Status: None   Collection Time: 03/22/16  6:31 AM  Result Value Ref Range   Vitamin B-12 776 180 - 914 pg/mL    Comment: (NOTE) This assay is not validated for testing neonatal or myeloproliferative syndrome specimens for Vitamin B12 levels.   Sedimentation rate     Status: Abnormal   Collection Time: 03/22/16  6:31 AM  Result Value Ref Range   Sed Rate 42 (H) 0 - 22 mm/hr  C-reactive protein     Status: None   Collection Time: 03/22/16  6:31 AM  Result Value Ref Range   CRP 0.9 <1.0 mg/dL  Glucose, capillary     Status: Abnormal   Collection Time: 03/22/16  8:25 AM  Result Value Ref Range   Glucose-Capillary 105 (H) 65 - 99 mg/dL  Glucose, capillary     Status: None   Collection Time: 03/22/16 12:29 PM  Result Value Ref Range   Glucose-Capillary 99 65 - 99 mg/dL  Glucose, capillary     Status: Abnormal   Collection Time: 03/22/16  4:55 PM  Result Value Ref Range   Glucose-Capillary 198 (H) 65 - 99 mg/dL  Glucose, capillary     Status: Abnormal   Collection Time: 03/22/16  9:57 PM  Result Value Ref Range   Glucose-Capillary 103 (H) 65 - 99 mg/dL  Glucose, capillary     Status: Abnormal   Collection Time: 03/23/16  7:55 AM  Result Value Ref Range   Glucose-Capillary 120 (H) 65 - 99 mg/dL   No results found.     Medical Problem List and Plan: 1.  Functional, mobility,  and swallowing deficits secondary to left internal capsule infarct  -admit to inpatient rehab 2.  DVT Prophylaxis/Anticoagulation: Pharmaceutical: Lovenox 3. Headaches/fibromyalgia with diffuse pain/Pain Management: Used flexeril prn, hydrocodone prn and cymbalta daily at home for diffuse pain.  Will resume cymbalta.  4. Mood: LCSW to follow for evaluation and support.  5. Neuropsych: This patient is not fully capable of making decisions on her  own behalf. 6. Skin/Wound Care: Routine pressure relief measures.  7. Fluids/Electrolytes/Nutrition: Monitor I/O and offer prosource supplement for low protein calorie malnutrition.  8. Blood pressure:   Has been elevated-- to allow for adequate perfusion at this time. Will monitor and add medication as indicated.   9.T2DM: Monitor BS ac/hs. Continue to use SSI for now and resume Amaryl as intake improves and BS start trending upwards. .  10. GERD: On Nexium at home--resume PPI. 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  12. Hypokalemia: Likely dilutional. Will recheck in am.     Post Admission Physician Evaluation: 1. Functional deficits secondary  to left internal capsule infarct. 2. Patient is admitted to receive collaborative, interdisciplinary care between the physiatrist, rehab nursing staff, and therapy team. 3. Patient's level of medical complexity and substantial therapy needs in context of that medical necessity cannot be provided at a lesser intensity of care such as a SNF. 4. Patient has experienced substantial functional loss from his/her baseline which was documented above under the "Functional History" and "Functional Status" headings.  Judging by the patient's diagnosis, physical exam, and functional history, the patient has potential for functional progress which will result in measurable gains while on inpatient rehab.  These gains will be of substantial and practical use upon discharge  in facilitating mobility  and self-care at the household level. 5. Physiatrist will provide 24 hour management of medical needs as well as oversight of the therapy plan/treatment and provide guidance as appropriate regarding the interaction of the two. 6. 24 hour rehab nursing will assist with bladder management, bowel management, safety, skin/wound care, disease management, medication administration, pain management and patient education  and help integrate therapy concepts, techniques,education, etc. 7. PT will assess and treat for/with: Lower extremity strength, range of motion, stamina, balance, functional mobility, safety, adaptive techniques and equipment, NMR, family education.   Goals are: mod I. 8. OT will assess and treat for/with: ADL's, functional mobility, safety, upper extremity strength, adaptive techniques and equipment, NMR, family ed, community reintegration.   Goals are: mod I to supervision. Therapy may proceed with showering this patient. 9. SLP will assess and treat for/with: speech, swallowing, cognition.  Goals are: mod I to supervision. 10. Case Management and Social Worker will assess and treat for psychological issues and discharge planning. 11. Team conference will be held weekly to assess progress toward goals and to determine barriers to discharge. 12. Patient will receive at least 3 hours of therapy per day at least 5 days per week. 13. ELOS: 7 days       14. Prognosis:  excellent     Meredith Staggers, MD, [facility] 03/23/2016  03/23/2016

## 2016-03-24 ENCOUNTER — Inpatient Hospital Stay (HOSPITAL_COMMUNITY): Payer: Medicare Other | Admitting: Speech Pathology

## 2016-03-24 ENCOUNTER — Inpatient Hospital Stay (HOSPITAL_COMMUNITY): Payer: Medicare Other | Admitting: Physical Therapy

## 2016-03-24 ENCOUNTER — Inpatient Hospital Stay (HOSPITAL_COMMUNITY): Payer: Medicare Other | Admitting: Occupational Therapy

## 2016-03-24 LAB — COMPREHENSIVE METABOLIC PANEL
ALK PHOS: 115 U/L (ref 38–126)
ALT: 22 U/L (ref 14–54)
ANION GAP: 11 (ref 5–15)
AST: 32 U/L (ref 15–41)
Albumin: 3.7 g/dL (ref 3.5–5.0)
BILIRUBIN TOTAL: 0.8 mg/dL (ref 0.3–1.2)
BUN: 17 mg/dL (ref 6–20)
CALCIUM: 9.5 mg/dL (ref 8.9–10.3)
CO2: 21 mmol/L — ABNORMAL LOW (ref 22–32)
Chloride: 108 mmol/L (ref 101–111)
Creatinine, Ser: 0.93 mg/dL (ref 0.44–1.00)
GFR, EST NON AFRICAN AMERICAN: 60 mL/min — AB (ref >60)
Glucose, Bld: 137 mg/dL — ABNORMAL HIGH (ref 65–99)
POTASSIUM: 3.9 mmol/L (ref 3.5–5.1)
Sodium: 140 mmol/L (ref 135–145)
TOTAL PROTEIN: 6.9 g/dL (ref 6.5–8.1)

## 2016-03-24 LAB — CBC WITH DIFFERENTIAL/PLATELET
BASOS PCT: 1 %
Basophils Absolute: 0.1 10*3/uL (ref 0.0–0.1)
EOS ABS: 0.2 10*3/uL (ref 0.0–0.7)
Eosinophils Relative: 2 %
HEMATOCRIT: 40.3 % (ref 36.0–46.0)
HEMOGLOBIN: 13.4 g/dL (ref 12.0–15.0)
LYMPHS ABS: 2.6 10*3/uL (ref 0.7–4.0)
Lymphocytes Relative: 26 %
MCH: 28 pg (ref 26.0–34.0)
MCHC: 33.3 g/dL (ref 30.0–36.0)
MCV: 84.3 fL (ref 78.0–100.0)
MONO ABS: 1 10*3/uL (ref 0.1–1.0)
MONOS PCT: 10 %
NEUTROS ABS: 6.3 10*3/uL (ref 1.7–7.7)
NEUTROS PCT: 61 %
Platelets: 441 10*3/uL — ABNORMAL HIGH (ref 150–400)
RBC: 4.78 MIL/uL (ref 3.87–5.11)
RDW: 14.4 % (ref 11.5–15.5)
WBC: 10.2 10*3/uL (ref 4.0–10.5)

## 2016-03-24 LAB — GLUCOSE, CAPILLARY
GLUCOSE-CAPILLARY: 134 mg/dL — AB (ref 65–99)
GLUCOSE-CAPILLARY: 106 mg/dL — AB (ref 65–99)
GLUCOSE-CAPILLARY: 108 mg/dL — AB (ref 65–99)
GLUCOSE-CAPILLARY: 159 mg/dL — AB (ref 65–99)

## 2016-03-24 MED ORDER — RESOURCE THICKENUP CLEAR PO POWD
ORAL | Status: DC | PRN
  Filled 2016-03-24: qty 125

## 2016-03-24 NOTE — Progress Notes (Signed)
Social Work Patient ID: Susan Patel, female   DOB: Aug 04, 1940, 75 y.o.   MRN: [id number]  MD presented pt at the team conference. Therapy team is evaluting and setting goals. Will have a formal team conference Next Wed. If pt moving quickly will meet before this time.

## 2016-03-24 NOTE — Evaluation (Signed)
Occupational Therapy Assessment and Plan  Patient Details  Name: Susan Patel MRN: [id number] Date of Birth: 1940-09-09  OT Diagnosis: cognitive deficits, hemiplegia affecting dominant side and muscle weakness (generalized) Rehab Potential: Rehab Potential (ACUTE ONLY): Excellent ELOS: 7-9 days   Today's Date: 03/24/2016 OT Individual Time: 7062-3762 OT Individual Time Calculation (min): 88 min      Problem List: Patient Active Problem List   Diagnosis Date Noted  . 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 Type 2 diabetes mellitus with peripheral neuropathy (HCC)   . Benign essential HTN   . Fibromyalgia   . Chronic pain syndrome   . Chronic obstructive pulmonary disease ([place])   . Gait disturbance, post-stroke   . Dysarthria, post-stroke   . Dysphagia, post-stroke   . AKI (acute kidney injury) ([place])   . Acute blood loss anemia   . 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 Mixed hyperlipidemia   . Stroke ([place]) 03/20/2016  . CVA (cerebral vascular accident) ([place]) 03/20/2016  . Allergic rhinitis 12/17/2015  . Primary osteoarthritis of both knees 11/19/2015  . Obesity (BMI 30-39.9) 09/04/2015  . Essential hypertension 09/04/2015  . Constipation 09/04/2015  . Asthma 08/26/2015  . Raynaud's phenomenon 08/26/2015  . Gastro-esophageal reflux disease with esophagitis 08/26/2015  . Spinal stenosis 08/26/2015  . Vitamin D deficiency 08/26/2015  . Diabetes mellitus, type 2 ([place]) 08/22/2015  . HNP (herniated nucleus pulposus), cervical 07/10/2015  . Cervical radicular pain 04/30/2013  . Knee osteoarthritis 01/05/2012    Past Medical History:  Past Medical History:  Diagnosis Date  . Arthritis    'all over"  . Asthma   . COPD (chronic obstructive pulmonary disease) (HCC)    O2 per [place] at nights   . Depression   . Diabetes mellitus without complication ([place])    borderline- , states she was on med., but MD  told her "everything is under control so I threw the bottle away"  . Fibromyalgia   . GERD (gastroesophageal reflux disease)   . Hypertension   . Neuromuscular disorder (HCC)    parkinson, neuropathy- both feet & hands.   Past Surgical History:  Past Surgical History:  Procedure Laterality Date  . ABDOMINAL HYSTERECTOMY    . ANTERIOR CERVICAL DECOMP/DISCECTOMY FUSION N/A 07/10/2015   Procedure: Cervical five-six, Cervical six-seven anterior cervical decompression with fusion plating and bonegraft;  Surgeon: Jovita Gamma, MD;  Location: [facility];  Service: Neurosurgery;  Laterality: N/A;  . APPENDECTOMY    . BIOPSY EYE MUSCLE  03/20/2016   biopsy vessel to right eye due to swelling  . EYE SURGERY Bilateral    cataracts removed, /w "cyrstal lenses"   . SHOULDER ARTHROSCOPY Right    x2   RCR- spurs removed     Assessment & Plan Clinical Impression: Patient is a 75 y.o. year old female with recent admission to the hospital on 03/20/16 with confusion, right facial droop with slurred speech and word finding deficits as well as difficulty walking. Last seen normal the night before going to bed. MRI/MRA brain revealed acute left internal capsule infarct with extensive chronic small vessel disease and no large vessel occlusion  Patient transferred to CIR on 03/23/2016 .    Patient currently requires min with basic self-care skills secondary to decreased coordination, decreased problem solving and decreased memory and decreased standing balance and decreased balance strategies.  Prior to hospitalization, patient could complete ADLs with independent .  Patient will benefit from skilled intervention to decrease level of assist with basic self-care skills and increase independence with basic self-care skills prior to discharge home with care partner.  Anticipate patient will require 24 hour supervision and follow up outpatient.  OT - End of Session Activity Tolerance: Tolerates 30+ min activity  with multiple rests Endurance Deficit: Yes OT Assessment Rehab Potential (ACUTE ONLY): Excellent OT Patient demonstrates impairments in the following area(s): Balance;Cognition;Motor;Safety OT Basic ADL's Functional Problem(s): Grooming;Bathing;Dressing;Toileting;Eating OT Advanced ADL's Functional Problem(s): Simple Meal Preparation;Laundry;Light Housekeeping OT Transfers Functional Problem(s): Tub/Shower;Toilet OT Additional Impairment(s): Fuctional Use of Upper Extremity OT Plan OT Intensity: Minimum of 1-2 x/day, 45 to 90 minutes OT Frequency: 5 out of 7 days OT Duration/Estimated Length of Stay: 7-9 days OT Treatment/Interventions: Balance/vestibular training;Self Care/advanced ADL retraining;Therapeutic Activities;Patient/family education;UE/LE Strength taining/ROM;DME/adaptive equipment instruction;UE/LE Coordination activities;Discharge planning;Functional mobility training;Neuromuscular re-education OT Self Feeding Anticipated Outcome(s): independent OT Basic Self-Care Anticipated Outcome(s): modified independent OT Toileting Anticipated Outcome(s): modified independent OT Bathroom Transfers Anticipated Outcome(s): supervision level OT Recommendation Patient destination: Home Follow Up Recommendations: 24 hour supervision/assistance Equipment Recommended: To be determined   Skilled Therapeutic Intervention Pt began working on functional transfers and selfcare retraining shower level.  Spouse was present as well and educated on safe assistance with toilet transfers using the hand held assist on the right side.  Pt tends to take slightly longer with bathing and grooming tasks, demonstrating coordination deficits and slower movement in the RUE.  LOB to the right on 2 occasions requiring min assist for balance.  Discussed ELOS with pt and spouse as well as anticipation of goals to be reached.  Pt left in bed at end of session with husband present.   OT  Evaluation Precautions/Restrictions  Precautions Precautions: Fall Precaution Comments: Impulsive Restrictions Weight Bearing Restrictions: No  Pain Pain Assessment Pain Assessment: No/denies pain Pain Score: 0-No pain Home Living/Prior Functioning Home Living Living Arrangements: Spouse/significant other Available Help at Discharge: Available 24 hours/day Type of Home: House Home Access: Ramped entrance, Stairs to enter (ramp at the back) Entrance Stairs-Number of Steps: 4 Entrance Stairs-Rails: Right Home Layout: One level, Able to live on main level with bedroom/bathroom Bathroom Shower/Tub:  (walk-in tub) Bathroom Toilet: Standard  Lives With: Spouse IADL History Homemaking Responsibilities: Yes Meal Prep Responsibility: Primary Laundry Responsibility: Primary Cleaning Responsibility: Primary Current License: Yes Mode of Transportation: Lucianne Lei Occupation: Retired Prior Function Level of Independence: Independent with basic ADLs  Able to Take Stairs?: Yes Driving: Yes Vocation: Retired Leisure: Hobbies-yes (Comment) Comments: used cane after initial spine surgery in Feb but not recently ADL  See Function Section of chart for details  Vision/Perception  Vision- History Baseline Vision/History: Wears glasses Patient Visual Report: No change from baseline Vision- Assessment Vision Assessment?: Yes Eye Alignment: Within Functional Limits Ocular Range of Motion: Within Functional Limits Tracking/Visual Pursuits: Decreased smoothness of horizontal tracking Visual Fields: No apparent deficits (difficult to assess secondary to pt continually turning head toward stimulus.  Will continue to monitor in function and with use of [employer]. )  Cognition Overall Cognitive Status: Impaired/Different from baseline Arousal/Alertness: Awake/alert Orientation Level: Person;Place;Situation Person: Oriented Place: Oriented Situation: Oriented Year: 2017 Month: November Day of  Week: Correct Memory: Impaired Immediate Memory Recall: Sock;Blue;Bed Memory Recall: Sock;Blue Memory Recall Sock: Without Cue Memory Recall Blue: Without Cue Attention: Sustained Sustained Attention: Appears intact Awareness: Impaired (Mod questioning cueing needed for her to realize balance deficits) Awareness Impairment: Anticipatory impairment Problem Solving: Impaired Problem Solving Impairment: Functional basic;Functional complex Executive  Function: Sequencing Sequencing: Impaired Behaviors: Impulsive Safety/Judgment: Impaired Comments: Pt attempting to stand from toilet without having assist.  Sensation Sensation Light Touch: Appears Intact Stereognosis: Appears Intact Hot/Cold: Appears Intact Proprioception: Appears Intact Additional Comments: Sensation intact in BUE Coordination Gross Motor Movements are Fluid and Coordinated: No Fine Motor Movements are Fluid and Coordinated: No Coordination and Movement Description: Slightly slower speed of movement noted with dysmetria testing on the right.  Slower performance of grooming tasks with the right hand as well.   Motor  Motor Motor: Hemiplegia Motor - Skilled Clinical Observations: Minor right sided weakness. Mobility  Bed Mobility Bed Mobility: Rolling Right;Rolling Left;Supine to Sit;Sit to Supine Rolling Right: 5: Supervision Rolling Right Details: Verbal cues for technique;Verbal cues for precautions/safety Rolling Left: 5: Supervision Rolling Left Details: Verbal cues for technique;Verbal cues for precautions/safety Supine to Sit: 4: Min assist Supine to Sit Details: Verbal cues for technique;Verbal cues for precautions/safety Sit to Supine: 5: Supervision Sit to Supine - Details: Verbal cues for technique;Verbal cues for precautions/safety Transfers Transfers: Sit to Stand;Stand to Sit Sit to Stand: With armrests;4: Min assist;Without upper extremity assist;From toilet Sit to Stand Details: Verbal cues for  precautions/safety Stand to Sit: With upper extremity assist;To chair/3-in-1;To toilet;4: Min guard  Trunk/Postural Assessment  Cervical Assessment Cervical Assessment: Within Functional Limits Thoracic Assessment Thoracic Assessment: Within Functional Limits Lumbar Assessment Lumbar Assessment: Within Functional Limits Postural Control Postural Control: Deficits on evaluation (Occasional LOB to the right with dynamic movement.)  Balance Balance Balance Assessed: Yes Standardized Balance Assessment Standardized Balance Assessment: Berg Balance Test Berg Balance Test Sit to Stand: Able to stand  independently using hands Standing Unsupported: Able to stand safely 2 minutes Sitting with Back Unsupported but Feet Supported on Floor or Stool: Able to sit safely and securely 2 minutes Stand to Sit: Sits safely with minimal use of hands Transfers: Able to transfer safely, definite need of hands Standing Unsupported with Eyes Closed: Able to stand 10 seconds safely Standing Ubsupported with Feet Together: Able to place feet together independently and stand 1 minute safely From Standing, Reach Forward with Outstretched Arm: Can reach forward >5 cm safely (2") From Standing Position, Pick up Object from Floor: Able to pick up shoe, needs supervision From Standing Position, Turn to Look Behind Over each Shoulder: Looks behind one side only/other side shows less weight shift Turn 360 Degrees: Able to turn 360 degrees safely but slowly Standing Unsupported, Alternately Place Feet on Step/Stool: Able to complete 4 steps without aid or supervision Standing Unsupported, One Foot in Front: Able to plae foot ahead of the other independently and hold 30 seconds Standing on One Leg: Able to lift leg independently and hold equal to or more than 3 seconds Total Score: 43 Static Sitting Balance Static Sitting - Balance Support: No upper extremity supported Static Sitting - Level of Assistance: 5: Stand  by assistance Dynamic Sitting Balance Dynamic Sitting - Balance Support: During functional activity Dynamic Sitting - Level of Assistance: 5: Stand by assistance Static Standing Balance Static Standing - Balance Support: During functional activity Static Standing - Level of Assistance: 5: Stand by assistance Dynamic Standing Balance Dynamic Standing - Balance Support: During functional activity Dynamic Standing - Level of Assistance: 4: Min assist Extremity/Trunk Assessment RUE Assessment RUE Assessment: Exceptions to [facility] RUE Strength RUE Overall Strength Comments: AROM WFLS for all joints except slight 15 degree limitation in internal rotation when reaching behind her back.  Slower functional use and decreased coordinaton noted with finger to  nose testing but uses functionally at a diminished level.   LUE Assessment LUE Assessment: Within Functional Limits   See Function Navigator for Current Functional Status.   Refer to Care Plan for Long Term Goals  Recommendations for other services: None  Discharge Criteria: Patient will be discharged from OT if patient refuses treatment 3 consecutive times without medical reason, if treatment goals not met, if there is a change in medical status, if patient makes no progress towards goals or if patient is discharged from hospital.  The above assessment, treatment plan, treatment alternatives and goals were discussed and mutually agreed upon: by patient and by family  Bayan Kushnir OTR/L 03/24/2016, 2:14 PM

## 2016-03-24 NOTE — Progress Notes (Signed)
75 y.o.femalewith history of T2DM with neuropathy, HTN, fibromyalgia--chronic pain, COPD--oxygen at nights,right visual deficits (negative work up) who was admitted on 03/20/16 with confusion, right facial droop with slurred speech and word finding deficits as well as difficulty walking. Last seen normal the night before going to bed. 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 Neurology consulted and recommended resuming ASA as patient not compliant with ASA. Work up in progress.   Subjective/Complaints:   Objective: Vital Signs: Blood pressure (!) 150/69, pulse 71, temperature 98.2 F (36.8 C), temperature source Oral, resp. rate 19, height 5\' 1"  (1.549 m), weight 83.2 kg (183 lb 6.8 oz), SpO2 97 %. No results found. Results for orders placed or performed during the hospital encounter of 03/23/16 (from the past 72 hour(s))  Glucose, capillary     Status: Abnormal   Collection Time: 03/23/16  4:40 PM  Result Value Ref Range   Glucose-Capillary 128 (H) 65 - 99 mg/dL  Glucose, capillary     Status: Abnormal   Collection Time: 03/23/16 10:29 PM  Result Value Ref Range   Glucose-Capillary 175 (H) 65 - 99 mg/dL  Glucose, capillary     Status: Abnormal   Collection Time: 03/24/16  6:56 AM  Result Value Ref Range   Glucose-Capillary 134 (H) 65 - 99 mg/dL      General: No acute distress Mood and affect are appropriate Heart: Regular rate and rhythm no rubs murmurs or extra sounds Lungs: Clear to auscultation, breathing unlabored, no rales or wheezes Abdomen: Positive bowel sounds, soft nontender to palpation, nondistended Extremities: No clubbing, cyanosis, or edema Skin: No evidence of breakdown, no evidence of rash Neurologic: Cranial nerves II through XII intact, motor strength is 5/5 in bilateral deltoid, bicep, tricep, grip, hip flexor, knee extensors, ankle dorsiflexor and plantar  flexor Speech dysarthric and aphasic  Musculoskeletal: Full range of motion in all 4 extremities. No joint swelling   Assessment/Plan: 1. 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 Physiatrist is providing close team supervision and 24 hour management of active medical problems listed below. Physiatrist and rehab team continue to assess barriers to discharge/monitor patient progress toward functional and medical goals. FIM:                   Function - Comprehension Comprehension: Auditory Comprehension assist level: Follows basic conversation/direction with extra time/assistive device  Function - Expression Expression: Verbal Expression assist level: Expresses basic needs/ideas: With extra time/assistive device  Function - Social Interaction Social Interaction assist level: Interacts appropriately 90% of the time - Needs monitoring or encouragement for participation or interaction.       Medical Problem List and Plan: 1.  Functional, mobility, and swallowing deficits secondary to left internal capsule infarct             -CIR PT, OT, SLP 2.  DVT Prophylaxis/Anticoagulation: Pharmaceutical: Lovenox 3. Headaches/fibromyalgia with diffuse pain/Pain Management: Used flexeril prn, hydrocodone prn and cymbalta daily at home for diffuse pain.  Will resume cymbalta.  4. Mood: LCSW to follow for evaluation and support.  5. Neuropsych: This patient is not fully capable of making decisions on her own behalf. 6. Skin/Wound Care: Routine pressure relief measures.  7. Fluids/Electrolytes/Nutrition: Monitor I/O and offer prosource supplement for low protein calorie malnutrition.  8. Blood pressure:   Has been elevated-- to allow for  adequate perfusion at this time. Will monitor and add medication as indicated.   Vitals:   03/23/16 1556 03/24/16 0450  BP: (!) 180/73 (!)  150/69  Pulse: 82 71  Resp: 17 19  Temp: 98.3 F (36.8 C) 98.2 F (36.8 C)   9.T2DM: Monitor BS ac/hs. Continue to use SSI for now and resume Amaryl as intake improves and BS start trending upwards. .  CBG (last 3)   Recent Labs  03/23/16 1640 03/23/16 2229 03/24/16 0656  GLUCAP 128* 175* 134*    10. GERD: On Nexium at home--resume PPI. 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  12. Hypokalemia: Likely dilutional. Will recheck  LOS (Days) 1 A FACE TO FACE EVALUATION WAS PERFORMED  Willett Lefeber E 03/24/2016, 8:21 AM

## 2016-03-24 NOTE — Progress Notes (Signed)
Social Work Assessment and Plan Social Work Assessment and Plan  Patient Details  Name: Susan Patel MRN: [id number] Date of Birth: 10-25-1940  Today's Date: 03/24/2016  Problem List:  Patient Active Problem List   Diagnosis Date Noted  . 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 Type 2 diabetes mellitus with peripheral neuropathy (HCC)   . Benign essential HTN   . Fibromyalgia   . Chronic pain syndrome   . Chronic obstructive pulmonary disease ([place])   . Gait disturbance, post-stroke   . Dysarthria, post-stroke   . Dysphagia, post-stroke   . AKI (acute kidney injury) ([place])   . Acute blood loss anemia   . 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 Mixed hyperlipidemia   . Stroke ([place]) 03/20/2016  . CVA (cerebral vascular accident) ([place]) 03/20/2016  . Allergic rhinitis 12/17/2015  . Primary osteoarthritis of both knees 11/19/2015  . Obesity (BMI 30-39.9) 09/04/2015  . Essential hypertension 09/04/2015  . Constipation 09/04/2015  . Asthma 08/26/2015  . Raynaud's phenomenon 08/26/2015  . Gastro-esophageal reflux disease with esophagitis 08/26/2015  . Spinal stenosis 08/26/2015  . Vitamin D deficiency 08/26/2015  . Diabetes mellitus, type 2 ([place]) 08/22/2015  . HNP (herniated nucleus pulposus), cervical 07/10/2015  . Cervical radicular pain 04/30/2013  . Knee osteoarthritis 01/05/2012   Past Medical History:  Past Medical History:  Diagnosis Date  . Arthritis    'all over"  . Asthma   . COPD (chronic obstructive pulmonary disease) (HCC)    O2 per [place] at nights   . Depression   . Diabetes mellitus without complication ([place])    borderline- , states she was on med., but MD told her "everything is under control so I threw the bottle away"  . Fibromyalgia   . GERD (gastroesophageal reflux disease)   . Hypertension   . Neuromuscular disorder (HCC)    parkinson, neuropathy- both feet & hands.   Past Surgical  History:  Past Surgical History:  Procedure Laterality Date  . ABDOMINAL HYSTERECTOMY    . ANTERIOR CERVICAL DECOMP/DISCECTOMY FUSION N/A 07/10/2015   Procedure: Cervical five-six, Cervical six-seven anterior cervical decompression with fusion plating and bonegraft;  Surgeon: Jovita Gamma, MD;  Location: [facility];  Service: Neurosurgery;  Laterality: N/A;  . APPENDECTOMY    . BIOPSY EYE MUSCLE  03/20/2016   biopsy vessel to right eye due to swelling  . EYE SURGERY Bilateral    cataracts removed, /w "cyrstal lenses"   . SHOULDER ARTHROSCOPY Right    x2   RCR- spurs removed    Social History:  reports that she has never smoked. She has never used smokeless tobacco. She reports that she does not drink alcohol or use drugs.  Family / Support Systems Marital Status: Married Patient Roles: Spouse, Parent, Volunteer Spouse/Significant Other: [name] [phone]  [phone] Children: Son and daughter Other Supports: Church members and friends Anticipated Caregiver: Husband Ability/Limitations of Caregiver: Husband is getting someone to manage his rental properties but does preach on Sundays but will take her with him. Caregiver Availability: 24/7 Family Dynamics: Close with family members, church members and friends. They have good supports and rely upon each other if have any needs.  Social History Preferred language: English Religion: Pentecostal Cultural Background: No issues Education: High School Read: Yes Write: Yes Employment Status: Retired Freight forwarder Issues: No issues Guardian/Conservator: None-according to MD pt is not capable of making her own decisions while  here. Will rely upon her husband if any decisions need to be made while here.   Abuse/Neglect Physical Abuse: Denies Verbal Abuse: Denies Sexual Abuse: Denies Exploitation of patient/patient's resources: Denies Self-Neglect: Denies  Emotional Status Pt's affect, behavior adn adjustment  status: Pt is motivated to improve and hopefully get out of her by Sat. Both are trying to be realistic about her progress and her rehab stay. She has always been independent and wants to get back to this. Recent Psychosocial Issues: other health issues managed by PCP Pyschiatric History: History of depression takes medications and feels they are helpful. She would benefit from seeing neuro-psych while here due to higher risk for major depression due to stroke and deficits. Substance Abuse History: No issues  Patient / Family Perceptions, Expectations & Goals Pt/Family understanding of illness & functional limitations: Pt and husband who are aware of her stroke and deficits, she has made good progress since it happened and both optimisitic about her rehab stay. Both talk with the MD daily while rounding and want to focus on her recovery. Premorbid pt/family roles/activities: Wife, Mother, church member, Psychologist, occupational, friend, etc Anticipated changes in roles/activities/participation: resume Pt/family expectations/goals: Pt states: " I will reach my goals while I am here."  Husband states: " I will be here and help her at home."  [employer]: None Premorbid Home Care/DME Agencies: None Transportation available at discharge: husband Resource referrals recommended: Support group (specify), Neuropsychology  Discharge Planning Living Arrangements: Spouse/significant other Support Systems: Spouse/significant other, Children, Other relatives, Water engineer, Social worker community Type of Residence: Private residence Insurance Resources: [employer], Multimedia programmer (specify) Printmaker) Financial Resources: Social Security, Family Support Financial Screen Referred: No Living Expenses: Own Money Management: Spouse, Patient Does the patient have any problems obtaining your medications?: No Home Management: Both did but primarily was the wife Patient/Family Preliminary  Plans: Return home with husband who will make arrangements to be there or have someone else there if he can not be. He plans to be here and observe her in therapies while here. Aware of team conference and evaluations. Social Work Anticipated Follow Up Needs: HH/OP, Support Group  Clinical Impression Pleasant female who is motivated to do well and reach her mod/i level goals she has set for herself. Her husband is committed to her and will provide 24 hr care if needed. He is here today to observe and See the goals the therapy team set. Will work on a safe discharge plan.  May benefit from seeing neuro-psych while here.  Elease Hashimoto 03/24/2016, 9:27 AM

## 2016-03-24 NOTE — Evaluation (Signed)
Physical Therapy Assessment and Plan  Patient Details  Name: Susan Patel MRN: [id number] Date of Birth: 1940-09-11  PT Diagnosis: Abnormality of gait, Difficulty walking, Hemiplegia dominant and Impaired cognition Rehab Potential: Excellent ELOS: 7-9 days    Today's Date: 03/24/2016 PT Individual Time: 1100-1200 PT Individual Time Calculation (min): 60 min     Problem List:  Patient Active Problem List   Diagnosis Date Noted  . 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 Type 2 diabetes mellitus with peripheral neuropathy (HCC)   . Benign essential HTN   . Fibromyalgia   . Chronic pain syndrome   . Chronic obstructive pulmonary disease ([place])   . Gait disturbance, post-stroke   . Dysarthria, post-stroke   . Dysphagia, post-stroke   . AKI (acute kidney injury) ([place])   . Acute blood loss anemia   . 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 Mixed hyperlipidemia   . Stroke ([place]) 03/20/2016  . CVA (cerebral vascular accident) ([place]) 03/20/2016  . Allergic rhinitis 12/17/2015  . Primary osteoarthritis of both knees 11/19/2015  . Obesity (BMI 30-39.9) 09/04/2015  . Essential hypertension 09/04/2015  . Constipation 09/04/2015  . Asthma 08/26/2015  . Raynaud's phenomenon 08/26/2015  . Gastro-esophageal reflux disease with esophagitis 08/26/2015  . Spinal stenosis 08/26/2015  . Vitamin D deficiency 08/26/2015  . Diabetes mellitus, type 2 ([place]) 08/22/2015  . HNP (herniated nucleus pulposus), cervical 07/10/2015  . Cervical radicular pain 04/30/2013  . Knee osteoarthritis 01/05/2012    Past Medical History:  Past Medical History:  Diagnosis Date  . Arthritis    'all over"  . Asthma   . COPD (chronic obstructive pulmonary disease) (HCC)    O2 per [place] at nights   . Depression   . Diabetes mellitus without complication ([place])    borderline- , states she was on med., but MD told her "everything is under control so  I threw the bottle away"  . Fibromyalgia   . GERD (gastroesophageal reflux disease)   . Hypertension   . Neuromuscular disorder (HCC)    parkinson, neuropathy- both feet & hands.   Past Surgical History:  Past Surgical History:  Procedure Laterality Date  . ABDOMINAL HYSTERECTOMY    . ANTERIOR CERVICAL DECOMP/DISCECTOMY FUSION N/A 07/10/2015   Procedure: Cervical five-six, Cervical six-seven anterior cervical decompression with fusion plating and bonegraft;  Surgeon: Jovita Gamma, MD;  Location: [facility];  Service: Neurosurgery;  Laterality: N/A;  . APPENDECTOMY    . BIOPSY EYE MUSCLE  03/20/2016   biopsy vessel to right eye due to swelling  . EYE SURGERY Bilateral    cataracts removed, /w "cyrstal lenses"   . SHOULDER ARTHROSCOPY Right    x2   RCR- spurs removed     Assessment & Plan Clinical Impression: Patient is a 75 y.o.femalewith history of T2DM with neuropathy, HTN, fibromyalgia--chronic pain, COPD--oxygen at nights,right visual deficits (negative work up) who was admitted on 03/20/16 with confusion, right facial droop with slurred speech and word finding deficits as well as difficulty walking. Last seen normal the night before going to bed. 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 Neurology consulted and recommended resuming ASA as patient not compliant with ASA. Work up in progress.   Speech therapy evaluation with moderate dysarthria with mild word finding deficits and mild pharyngeal dysphagia with trace aspiration of thins--placed on dysphagia 3,  nectar liquids. Patient with resultant, balance deficits, visual deficits, decreased attention, poor safety awareness with impulsivity affecting safety with mobility. Patient transferred to CIR on 03/23/2016 .   Patient currently requires min with mobility secondary to muscle weakness, unbalanced muscle activation, decreased attention  to right and decreased standing balance, hemiplegia and decreased balance strategies.  Prior to hospitalization, patient was independent  with mobility and lived with Spouse in a House home.  Home access is 4Ramped entrance, Stairs to enter (ramp at the back).  Patient will benefit from skilled PT intervention to maximize safe functional mobility, minimize fall risk and decrease caregiver burden for planned discharge home with intermittent assist.  Anticipate patient will benefit from follow up [facility] at discharge.  PT - End of Session Activity Tolerance: Tolerates 30+ min activity with multiple rests Endurance Deficit: Yes PT Assessment Rehab Potential (ACUTE/IP ONLY): Excellent Barriers to Discharge: Inaccessible home environment PT Patient demonstrates impairments in the following area(s): Balance;Behavior;Endurance;Motor;Perception;Safety PT Transfers Functional Problem(s): Bed Mobility;Bed to Chair;Floor;Car;Furniture PT Locomotion Functional Problem(s): Ambulation;Wheelchair Mobility;Stairs PT Plan PT Intensity: Minimum of 1-2 x/day ,45 to 90 minutes PT Frequency: 5 out of 7 days PT Duration Estimated Length of Stay: 7-9 days  PT Treatment/Interventions: Ambulation/gait training;Cognitive remediation/compensation;Community reintegration;Discharge planning;Balance/vestibular training;Disease management/prevention;DME/adaptive equipment instruction;Functional mobility training;Neuromuscular re-education;Patient/family education;Psychosocial support;Skin care/wound management;Stair training;Therapeutic Activities;Therapeutic Exercise;Visual/perceptual remediation/compensation;UE/LE Coordination activities;Wheelchair propulsion/positioning;UE/LE Strength taining/ROM PT Transfers Anticipated Outcome(s): Mod I with LRAD  PT Locomotion Anticipated Outcome(s): Mod I- Supervision Assist with LRAD   PT Recommendation Follow Up Recommendations: Home health PT Patient destination: Home Equipment  Recommended: To be determined     Skilled Therapeutic Intervention Patient received supine in bed and agreeable to PT. PT instructed patient in PT evaluation and initiated treatment intervention; see below for results. PT educated patient in rehab goals, LOS, treatment interventions. PT instructed patient in transfers as listed below including WC<>bed, car, and sit<>stand. Gait training performed over level and unlevel surface with min assist on un level surface and min-supervision on level surface. PT instructed patient in berg balance test; see below for detailed results Patient demonstrates increased fall risk as noted by score of  43/56 on Berg Balance Scale.  (<36= high risk for falls, close to 100%; 37-45 significant >80%; 46-51 moderate >50%; 52-55 lower >25%).   Patient returned to room and left sitting on couch with husband present and call bell in reach.     PT Evaluation Precautions/Restrictions   General   Vital SignsTherapy Vitals Temp: 98.4 F (36.9 C) Temp Source: Oral Pulse Rate: 76 Resp: 18 BP: (!) 166/61 (Reported to Ed RN) Patient Position (if appropriate): Lying Oxygen Therapy SpO2: 99 % O2 Device: Not Delivered Pain Pain Assessment Pain Assessment: No/denies pain Home Living/Prior Functioning Home Living Available Help at Discharge: Available 24 hours/day Type of Home: House  Lives With: Spouse Prior Function Vocation: Retired  Associate Professor Overall Cognitive Status: Impaired/Different from baseline Arousal/Alertness: Awake/alert Orientation Level: Oriented to situation;Oriented to person;Oriented to place Attention: Sustained Sustained Attention: Appears intact Memory: Impaired Memory Impairment: Storage deficit;Retrieval deficit Awareness: Impaired (Mod questioning cueing needed for her to realize balance deficits) Awareness Impairment: Anticipatory impairment Problem Solving: Impaired Problem Solving Impairment: Functional basic;Functional  complex Executive Function: Sequencing Sequencing: Impaired Sequencing Impairment: Verbal basic;Functional basic Organizing: Impaired Organizing Impairment: Verbal basic;Functional basic Behaviors: Impulsive Safety/Judgment: Impaired Comments: pt initiated sit<>stand from [facility] several times throughout treatment without Assist from PT.  Sensation Sensation Light Touch: Appears Intact Proprioception: Appears Intact Additional Comments: Reports decreased appreciation to light touch  in distal R LE.  Coordination Gross Motor Movements are Fluid and Coordinated: No Fine Motor Movements are Fluid and Coordinated: No Motor  Motor Motor: Hemiplegia Motor - Skilled Clinical Observations: Minor right sided weakness.  Mobility Bed Mobility Bed Mobility: Rolling Right;Rolling Left;Supine to Sit;Sit to Supine Rolling Right: 5: Supervision Rolling Right Details: Verbal cues for technique;Verbal cues for precautions/safety Rolling Left: 5: Supervision Rolling Left Details: Verbal cues for technique;Verbal cues for precautions/safety Supine to Sit: 4: Min assist Supine to Sit Details: Verbal cues for technique;Verbal cues for precautions/safety Sit to Supine: 5: Supervision Sit to Supine - Details: Verbal cues for technique;Verbal cues for precautions/safety Transfers Transfers: Yes Sit to Stand: 5: Supervision Sit to Stand Details: Verbal cues for precautions/safety Stand Pivot Transfers: 4: Min assist Stand Pivot Transfer Details: Verbal cues for technique;Verbal cues for precautions/safety Locomotion  Ambulation Ambulation: Yes Ambulation/Gait Assistance: 4: Min assist Assistive device: None Ambulation/Gait Assistance Details: Verbal cues for gait pattern;Verbal cues for technique;Verbal cues for precautions/safety Gait Gait: Yes Gait Pattern: Impaired Gait Pattern: Step-through pattern;Decreased step length - right Stairs / Additional Locomotion Stairs: Yes Stairs Assistance: 4:  Min guard Stair Management Technique: Two rails Height of Stairs: 6 Ramp: 4: Min assist Curb: 4: Min Chemical engineer: Yes Wheelchair Assistance: 3: Building surveyor Details: Verbal cues for technique;Verbal cues for precautions/safety;Verbal cues for gait Product/process development scientist: Both upper extremities Wheelchair Parts Management: Needs assistance  Trunk/Postural Assessment  Cervical Assessment Cervical Assessment: Within Scientist, physiological Assessment: Within Functional Limits Lumbar Assessment Lumbar Assessment: Within Functional Limits Postural Control Postural Control: Deficits on evaluation (delayed righting reactions in standing. )  Balance Balance Balance Assessed: Yes Standardized Balance Assessment Standardized Balance Assessment: Berg Balance Test Berg Balance Test Sit to Stand: Able to stand  independently using hands Standing Unsupported: Able to stand safely 2 minutes Sitting with Back Unsupported but Feet Supported on Floor or Stool: Able to sit safely and securely 2 minutes Stand to Sit: Sits safely with minimal use of hands Transfers: Able to transfer safely, definite need of hands Standing Unsupported with Eyes Closed: Able to stand 10 seconds safely Standing Ubsupported with Feet Together: Able to place feet together independently and stand 1 minute safely From Standing, Reach Forward with Outstretched Arm: Can reach forward >5 cm safely (2") From Standing Position, Pick up Object from Floor: Able to pick up shoe, needs supervision From Standing Position, Turn to Look Behind Over each Shoulder: Looks behind one side only/other side shows less weight shift Turn 360 Degrees: Able to turn 360 degrees safely but slowly Standing Unsupported, Alternately Place Feet on Step/Stool: Able to complete 4 steps without aid or supervision Standing Unsupported, One Foot in Front: Able to plae foot  ahead of the other independently and hold 30 seconds Standing on One Leg: Able to lift leg independently and hold equal to or more than 3 seconds Total Score: 43 Static Sitting Balance Static Sitting - Level of Assistance: 6: Modified independent (Device/Increase time) Dynamic Sitting Balance Dynamic Sitting - Level of Assistance: 5: Stand by assistance Static Standing Balance Static Standing - Level of Assistance: 5: Stand by assistance Dynamic Standing Balance Dynamic Standing - Level of Assistance: 4: Min assist Extremity Assessment  RUE Assessment RUE Assessment: Exceptions to [facility] RUE Strength RUE Overall Strength Comments: AROM WFLS for all joints except slight 15 degree limitation in internal rotation when reaching behind her back.  Slower functional use and decreased coordinaton noted with finger  to nose testing but uses functionally at a diminished level.   LUE Assessment LUE Assessment: Within Functional Limits RLE Assessment RLE Assessment: Exceptions to [facility] (4/5 hip flexion all others 4+/5 ) LLE Assessment LLE Assessment: Exceptions to [facility] (4/5 hip flexion, all others 4+/%)   See Function Navigator for Current Functional Status.   Refer to Care Plan for Long Term Goals  Recommendations for other services: None  Discharge Criteria: Patient will be discharged from PT if patient refuses treatment 3 consecutive times without medical reason, if treatment goals not met, if there is a change in medical status, if patient makes no progress towards goals or if patient is discharged from hospital.  The above assessment, treatment plan, treatment alternatives and goals were discussed and mutually agreed upon: by patient and by family  Lorie Phenix 03/24/2016, 5:50 PM

## 2016-03-24 NOTE — Care Management Note (Signed)
[facility] Individual Statement of Services  Patient Name:  Susan Patel  Date:  03/24/2016  Welcome to the [facility].  Our goal is to provide you with an individualized program based on your diagnosis and situation, designed to meet your specific needs.  With this comprehensive rehabilitation program, you will be expected to participate in at least 3 hours of rehabilitation therapies Monday-Friday, with modified therapy programming on the weekends.  Your rehabilitation program will include the following services:  Physical Therapy (PT), Occupational Therapy (OT), Speech Therapy (ST), 24 hour per day rehabilitation nursing, Therapeutic Recreaction (TR), Neuropsychology, Case Management (Social Worker), Rehabilitation Medicine, Nutrition Services and Pharmacy Services  Weekly team conferences will be held on Wednesday to discuss your progress.  Your Social Worker will talk with you frequently to get your input and to update you on team discussions.  Team conferences with you and your family in attendance may also be held.  Expected length of stay: 7-9 days  Overall anticipated outcome: supervision-mod/i level  Depending on your progress and recovery, your program may change. Your Social Worker will coordinate services and will keep you informed of any changes. Your Social Worker's name and contact numbers are listed  below.  The following services may also be recommended but are not provided by the [facility] will be made to provide these services after discharge if needed.  Arrangements include referral to agencies that provide these services.  Your insurance has been verified to be:  [place] Your primary doctor is:  Evelina Dun  Pertinent information will be shared with your doctor and your  insurance company.  Social Worker:  Ovidio Kin, [place] or ([phone]  Information discussed with and copy given to patient by: Elease Hashimoto, 03/24/2016, 9:29 AM

## 2016-03-24 NOTE — Progress Notes (Signed)
Recreational Therapy Session Note  Patient Details  Name: Susan Patel MRN: [id number] Date of Birth: [birth year] Today's Date: 03/24/2016  Pain: no c/o  Pt participated in animal assisted activity/therapy bed level with supervision.  Pt's husband present and participatory.  Danaya Geddis 03/24/2016, 2:44 PM

## 2016-03-24 NOTE — Evaluation (Signed)
Speech Language Pathology Assessment and Plan  Patient Details  Name: Susan Patel MRN: [id number] Date of Birth: July 01, 1940  SLP Diagnosis: Aphasia;Dysarthria;Apraxia;Dysphagia  Rehab Potential: Good ELOS: 7 days     Today's Date: 03/24/2016 SLP Individual Time: 1430-1530 SLP Individual Time Calculation (min): 60 min    Problem List: Patient Active Problem List   Diagnosis Date Noted  . 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 Type 2 diabetes mellitus with peripheral neuropathy (HCC)   . Benign essential HTN   . Fibromyalgia   . Chronic pain syndrome   . Chronic obstructive pulmonary disease ([place])   . Gait disturbance, post-stroke   . Dysarthria, post-stroke   . Dysphagia, post-stroke   . AKI (acute kidney injury) ([place])   . Acute blood loss anemia   . 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 Mixed hyperlipidemia   . Stroke ([place]) 03/20/2016  . CVA (cerebral vascular accident) ([place]) 03/20/2016  . Allergic rhinitis 12/17/2015  . Primary osteoarthritis of both knees 11/19/2015  . Obesity (BMI 30-39.9) 09/04/2015  . Essential hypertension 09/04/2015  . Constipation 09/04/2015  . Asthma 08/26/2015  . Raynaud's phenomenon 08/26/2015  . Gastro-esophageal reflux disease with esophagitis 08/26/2015  . Spinal stenosis 08/26/2015  . Vitamin D deficiency 08/26/2015  . Diabetes mellitus, type 2 ([place]) 08/22/2015  . HNP (herniated nucleus pulposus), cervical 07/10/2015  . Cervical radicular pain 04/30/2013  . Knee osteoarthritis 01/05/2012   Past Medical History:  Past Medical History:  Diagnosis Date  . Arthritis    'all over"  . Asthma   . COPD (chronic obstructive pulmonary disease) (HCC)    O2 per [place] at nights   . Depression   . Diabetes mellitus without complication ([place])    borderline- , states she was on med., but MD told her "everything is under control so I threw the bottle away"  . Fibromyalgia    . GERD (gastroesophageal reflux disease)   . Hypertension   . Neuromuscular disorder (HCC)    parkinson, neuropathy- both feet & hands.   Past Surgical History:  Past Surgical History:  Procedure Laterality Date  . ABDOMINAL HYSTERECTOMY    . ANTERIOR CERVICAL DECOMP/DISCECTOMY FUSION N/A 07/10/2015   Procedure: Cervical five-six, Cervical six-seven anterior cervical decompression with fusion plating and bonegraft;  Surgeon: Jovita Gamma, MD;  Location: [facility];  Service: Neurosurgery;  Laterality: N/A;  . APPENDECTOMY    . BIOPSY EYE MUSCLE  03/20/2016   biopsy vessel to right eye due to swelling  . EYE SURGERY Bilateral    cataracts removed, /w "cyrstal lenses"   . SHOULDER ARTHROSCOPY Right    x2   RCR- spurs removed     Assessment / Plan / Recommendation Clinical Impression   Susan Patel is a 75 y.o. female with history of T2DM with neuropathy, HTN, fibromyalgia--chronic pain, COPD--oxygen at nights, right visual deficits (negative work up) who was admitted on 03/20/16 with confusion, right facial droop with slurred speech and word finding deficits as well as difficulty walking. 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 Patient with resultant, balance deficits, visual deficits, decreased attention, poor safety awareness with impulsivity affecting safety with mobility. Pt admitted to CIR on 03/23/2016.  SLP evaluation completed on 03/24/2016 with the following results:  Pt presents with mild expressive>receptive aphasia (difficult to differentiate transcortical motor versus anomic) with mild-moderate  verbal apraxia as well as a moderate dysarthria.  Pt is able to follow 1 and 2 step commands without difficulty although certain motor acts may be prolonged, effortful or delayed at times.  Pt can also answer basic to semi-complex yes/no questions without difficulty.  Pt's confrontational naming  of basic objects is grossly intact but pt struggles with conveying more abstract ideas and concepts.  Pt's motor speech is characterized by slowed effortful verbal output with consistent sound distortions and prolongations across multiple productions.  Multisyllabic words were consistently more difficulty than single syllable words.  Pt's intelligibility is also impacted by right sided oral motor weakness which impacts articulatory precision at the word level.   A bedside swallow evaluation was also completed on this date with result consistent with most recent objective study.  Pt demonstrated s/s of airway compromise with thin liquids and ice chips characterized by delayed throat clear which were mitigated with nectar thick liquids.  No overt s/s of aspiration were evident with solids or purees and pt was able to clear materials from the oral cavity without significant residuals post swallow.   Given the abovementioned deficits, pt would benefit from skilled ST while inpatient in order to maximize functional independence and reduce burden of care prior to discharge.  Anticipate that pt will need 24/7 supervision at discharge in addition to [place] [facility] follow up at next level of care.    Skilled Therapeutic Interventions          Cognitive-linguistic and bedside swallow evaluation completed with results and recommendations reviewed with patient and family.  Pt needed overall mod assist for functional communication of more complex and abstract ideas.  Of note, pt also struggled with written expression and demonstrated awareness of decline in function s/p CVA but was unable to correct written errors.  Pt consumed trials of advanced textures as well as presentations of her currently prescribed diet with distant supervision cues for use of swallowing precautions. Husband verbalizes good toleration of current diet.  Discussed with pt and pt's husband rationale for ST interventions and realistic prognostic expectations for  recovery.  All questions were answered to their satisfaction at this time.     SLP Assessment  Patient will need skilled Speech Lanaguage Pathology Services during CIR admission    Recommendations  SLP Diet Recommendations: Dysphagia 3 (Mech soft);Nectar Liquid Administration via: Cup Medication Administration: Whole meds with puree Supervision: Patient able to self feed;Intermittent supervision to cue for compensatory strategies Compensations: Minimize environmental distractions;Slow rate;Small sips/bites Postural Changes and/or Swallow Maneuvers: Seated upright 90 degrees Oral Care Recommendations: Oral care BID Patient destination: Home Follow up Recommendations: Outpatient SLP;24 hour supervision/assistance Equipment Recommended: To be determined    SLP Frequency 3 to 5 out of 7 days   SLP Duration  SLP Intensity  SLP Treatment/Interventions 7 days   Minumum of 1-2 x/day, 30 to 90 minutes  Cognitive remediation/compensation;Cueing hierarchy;Dysphagia/aspiration precaution training;Environmental controls;Functional tasks;Patient/family education;Speech/Language facilitation;Multimodal communication approach;Internal/external aids    Pain Pain Assessment Pain Assessment: No/denies pain Pain Score: 0-No pain  Prior Functioning Cognitive/Linguistic Baseline: Within functional limits Type of Home: House  Lives With: Spouse Available Help at Discharge: Available 24 hours/day Vocation: Retired  Function:  Eating Eating   Modified Consistency Diet: Yes Eating Assist Level: Supervision or verbal cues           Cognition Comprehension Comprehension assist level: Follows basic conversation/direction with extra time/assistive device  Expression   Expression assist level: Expresses basic 50 - 74% of the time/requires cueing  25 - 49% of the time. Needs to repeat parts of sentences.  Social Interaction Social Interaction assist level: Interacts appropriately 75 - 89% of  the time - Needs redirection for appropriate language or to initiate interaction.  Problem Solving Problem solving assist level: Solves basic 75 - 89% of the time/requires cueing 10 - 24% of the time  Memory Memory assist level: Recognizes or recalls 50 - 74% of the time/requires cueing 25 - 49% of the time   Short Term Goals: Week 1: SLP Short Term Goal 1 (Week 1): STG=LTG due to ELOS   Refer to Care Plan for Long Term Goals  Recommendations for other services: None  Discharge Criteria: Patient will be discharged from SLP if patient refuses treatment 3 consecutive times without medical reason, if treatment goals not met, if there is a change in medical status, if patient makes no progress towards goals or if patient is discharged from hospital.  The above assessment, treatment plan, treatment alternatives and goals were discussed and mutually agreed upon: by patient and by family  Emilio Math 03/24/2016, 4:19 PM

## 2016-03-25 ENCOUNTER — Inpatient Hospital Stay (HOSPITAL_COMMUNITY): Payer: Medicare Other | Admitting: Speech Pathology

## 2016-03-25 ENCOUNTER — Inpatient Hospital Stay (HOSPITAL_COMMUNITY): Payer: Medicare Other | Admitting: Occupational Therapy

## 2016-03-25 ENCOUNTER — Inpatient Hospital Stay (HOSPITAL_COMMUNITY): Payer: Medicare Other | Admitting: Physical Therapy

## 2016-03-25 ENCOUNTER — Ambulatory Visit (HOSPITAL_COMMUNITY): Payer: Medicare Other | Admitting: Physical Therapy

## 2016-03-25 ENCOUNTER — Inpatient Hospital Stay (HOSPITAL_COMMUNITY): Payer: Medicare Other | Admitting: *Deleted

## 2016-03-25 LAB — RAPID URINE DRUG SCREEN, HOSP PERFORMED

## 2016-03-25 LAB — URINALYSIS, ROUTINE W REFLEX MICROSCOPIC

## 2016-03-25 LAB — GLUCOSE, CAPILLARY
GLUCOSE-CAPILLARY: 89 mg/dL (ref 65–99)
GLUCOSE-CAPILLARY: 114 mg/dL — AB (ref 65–99)
Glucose-Capillary: 114 mg/dL — ABNORMAL HIGH (ref 65–99)
Glucose-Capillary: 131 mg/dL — ABNORMAL HIGH (ref 65–99)

## 2016-03-25 NOTE — Progress Notes (Signed)
Speech Language Pathology Daily Session Note  Patient Details  Name: Susan Patel MRN: [id number] Date of Birth: 29-May-1940  Today's Date: 03/25/2016 SLP Individual Time: 0905-1000 SLP Individual Time Calculation (min): 55 min   Short Term Goals: Week 1: SLP Short Term Goal 1 (Week 1): STG=LTG due to ELOS   Skilled Therapeutic Interventions:  Pt was seen for skilled ST targeting cognitive-linguistic and dysphagia goals.  Pt consumed trials of regular water following oral care to continue working towards readiness for repeat objective study.  Pt demonstrated no overt s/s of aspiration with controlled cup sips of water, but demonstrated immediate and delayed coughing with larger sips of water which were both preceded by wet vocal quality.  Recommend trials of water with SLP only at this time given consistent aspiration with thin liquids noted on most recent objective study.  Therapist also facilitated the session with a verbal picture description task targeting functional communication.  Pt needed mod assist verbal cues to increase vocal intensity to achieve intelligibility in sentences.  Pt was able to correct verbal errors due to motor planning with min assist verbal cues to increase awareness.  Pt only demonstrated intermittent word finding difficulty for which she benefited from min assist verbal cues.  Pt needed min assist verbal cues for route recall when ambulating back to her room from treatment room.  Pt was left in room with husband at bedside, sitting at edge of bed.  Continue per current plan of care.       Function:  Eating Eating   Modified Consistency Diet: Yes Eating Assist Level: Supervision or verbal cues           Cognition Comprehension Comprehension assist level: Follows basic conversation/direction with extra time/assistive device  Expression   Expression assist level: Expresses basic 75 - 89% of the time/requires cueing 10 - 24% of the time. Needs helper to occlude  trach/needs to repeat words.  Social Interaction Social Interaction assist level: Interacts appropriately 75 - 89% of the time - Needs redirection for appropriate language or to initiate interaction.  Problem Solving Problem solving assist level: Solves basic 75 - 89% of the time/requires cueing 10 - 24% of the time  Memory Memory assist level: Recognizes or recalls 50 - 74% of the time/requires cueing 25 - 49% of the time    Pain Pain Assessment Pain Assessment: No/denies pain  Therapy/Group: Individual Therapy  Ashonte Angelucci, Selinda Orion 03/25/2016, 12:29 PM

## 2016-03-25 NOTE — Evaluation (Signed)
Recreational Therapy Assessment and Plan  Patient Details  Name: Susan Patel MRN: [id number] Date of Birth: 06/16/1940 Today's Date: 03/25/2016  Rehab Potential: Good ELOS: 7 days   Assessment Clinical Impression:  Problem List: Patient Active Problem List   Diagnosis Date Noted  . 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 Type 2 diabetes mellitus with peripheral neuropathy (HCC)   . Benign essential HTN   . Fibromyalgia   . Chronic pain syndrome   . Chronic obstructive pulmonary disease ([place])   . Gait disturbance, post-stroke   . Dysarthria, post-stroke   . Dysphagia, post-stroke   . AKI (acute kidney injury) ([place])   . Acute blood loss anemia   . 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 Mixed hyperlipidemia   . Stroke ([place]) 03/20/2016  . CVA (cerebral vascular accident) ([place]) 03/20/2016  . Allergic rhinitis 12/17/2015  . Primary osteoarthritis of both knees 11/19/2015  . Obesity (BMI 30-39.9) 09/04/2015  . Essential hypertension 09/04/2015  . Constipation 09/04/2015  . Asthma 08/26/2015  . Raynaud's phenomenon 08/26/2015  . Gastro-esophageal reflux disease with esophagitis 08/26/2015  . Spinal stenosis 08/26/2015  . Vitamin D deficiency 08/26/2015  . Diabetes mellitus, type 2 ([place]) 08/22/2015  . HNP (herniated nucleus pulposus), cervical 07/10/2015  . Cervical radicular pain 04/30/2013  . Knee osteoarthritis 01/05/2012    Past Medical History:      Past Medical History:  Diagnosis Date  . Arthritis    'all over"  . Asthma   . COPD (chronic obstructive pulmonary disease) (HCC)    O2 per [place] at nights   . Depression   . Diabetes mellitus without complication ([place])    borderline- , states she was on med., but MD told her "everything is under control so I threw the bottle away"  . Fibromyalgia   . GERD (gastroesophageal reflux disease)   . Hypertension   .  Neuromuscular disorder (HCC)    parkinson, neuropathy- both feet & hands.   Past Surgical History:       Past Surgical History:  Procedure Laterality Date  . ABDOMINAL HYSTERECTOMY    . ANTERIOR CERVICAL DECOMP/DISCECTOMY FUSION N/A 07/10/2015   Procedure: Cervical five-six, Cervical six-seven anterior cervical decompression with fusion plating and bonegraft;  Surgeon: Jovita Gamma, MD;  Location: [facility];  Service: Neurosurgery;  Laterality: N/A;  . APPENDECTOMY    . BIOPSY EYE MUSCLE  03/20/2016   biopsy vessel to right eye due to swelling  . EYE SURGERY Bilateral    cataracts removed, /w "cyrstal lenses"   . SHOULDER ARTHROSCOPY Right    x2   RCR- spurs removed     Assessment & Plan Clinical Impression: Patient is a 75 y.o. year old female with recent admission to the hospital on 03/20/16 with confusion, rightfacial droop with slurred speech and word finding deficits as well as difficulty walking. Last seen normal the night before going to bed. MRI/MRA brain revealed acute left internal capsule infarct with extensive chronic small vessel disease and no large vessel occlusion  Patient transferred to CIR on 03/23/2016.    Met with pt and pts husband to discuss TR services, use of leisure time, activity analysis with potential modifications & community pursuits.  No further TR, will monitor through team for community reintegration.   Leisure History/Participation Premorbid leisure interest/current participation: Medical laboratory scientific officer - Travel (Comment);Community - Psychologist, forensic;[place]ra [place] - Flower gardening Expression Interests: Music (Comment);Play  instrument (Comment);Singing (piano) Other Leisure Interests: Television;Reading;Cooking/Baking;Housework Leisure Participation Style: With Family/Friends Awareness of Community Resources: Excellent Psychosocial / Spiritual Spiritual Interests: Church Stress Management: Good Methods of Stress Management:  prayer Patient agreeable to Pet Therapy: Yes Social interaction - Mood/Behavior: Cooperative Academic librarian Appropriate for Education?: Yes Strengths/Weaknesses Patient Strengths/Abilities: Willingness to participate Patient weaknesses: Physical limitations  Plan No further TR, will monitor through team for community reintegration.  Recommendations for other services: None  Discharge Criteria: Patient will be discharged from TR if patient refuses treatment 3 consecutive times without medical reason.  If treatment goals not met, if there is a change in medical status, if patient makes no progress towards goals or if patient is discharged from hospital.  The above assessment, treatment plan, treatment alternatives and goals were discussed and mutually agreed upon: by patient  [place] 03/25/2016, 9:44 AM

## 2016-03-25 NOTE — Progress Notes (Signed)
Physical Therapy Session Note  Patient Details  Name: Susan Patel MRN: [id number] Date of Birth: 20-Jun-1940  Today's Date: 03/25/2016 PT Individual Time: 1100-1200 PT Individual Time Calculation (min): 60 min    Short Term Goals: Week 1:  PT Short Term Goal 1 (Week 1): STG=LTG due to ELOS   Skilled Therapeutic Interventions/Progress Updates:    Pt resting in bed on arrival, no c/o pain, and agreeable to therapy session.  Session focus on high level balance, gait, problem solving, sequencing, and NMR.    Gait training to and from therapy gym with steady assist for balance.  Pt demos decreased foot clearance bilaterally and occasional stumbling to R side which she is able to correct with stepping strategy.  High level gait training for balance and turning weaving through cones and side stepping through cones 4 trials each activity.  Attempted gait throwing and catching a ball but pt unable to divide attention to complete task effectively.    NMR for balance standing on foam wedge for reaching task focus on use of RUE, trunk rotation, and forward weight shift.  Kinetron x8 minutes with 2 rest breaks at 25-30 cm/s in seated position focus on pushing through heels to improve heel strike during ambulation.    Pt completed 2 pipe tree designs in standing for standing tolerance and cognitive change for problem solving and sequencing.  Pt required mod cues overall to complete designs correctly.    Pt returned to room at end of session and positioned seated EOB with call bell in reach and needs met.   Therapy Documentation Precautions:  Precautions Precautions: Fall Precaution Comments: Impulsive Restrictions Weight Bearing Restrictions: No   See Function Navigator for Current Functional Status.   Therapy/Group: Individual Therapy  Earnest Conroy [place] 03/25/2016, 12:25 PM

## 2016-03-25 NOTE — Progress Notes (Signed)
75 y.o.femalewith history of T2DM with neuropathy, HTN, fibromyalgia--chronic pain, COPD--oxygen at nights,right visual deficits (negative work up) who was admitted on 03/20/16 with confusion, right facial droop with slurred speech and word finding deficits as well as difficulty walking. Last seen normal the night before going to bed. 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 Neurology consulted and recommended resuming ASA as patient not compliant with ASA. Work up in progress.   Subjective/Complaints: Expressive language remains limited  ROS- unable to obtain due to speech issues  Objective: Vital Signs: Blood pressure (!) 148/78, pulse 71, temperature 97.1 F (36.2 C), temperature source Oral, resp. rate 18, height _0  (1.549 m), weight 83.2 kg (183 lb 6.8 oz), SpO2 98 %. No results found. Results for orders placed or performed during the hospital encounter of 03/23/16 (from the past 72 hour(s))  Glucose, capillary     Status: Abnormal   Collection Time: 03/23/16  4:40 PM  Result Value Ref Range   Glucose-Capillary 128 (H) 65 - 99 mg/dL  Glucose, capillary     Status: Abnormal   Collection Time: 03/23/16 10:29 PM  Result Value Ref Range   Glucose-Capillary 175 (H) 65 - 99 mg/dL  Glucose, capillary     Status: Abnormal   Collection Time: 03/24/16  6:56 AM  Result Value Ref Range   Glucose-Capillary 134 (H) 65 - 99 mg/dL  CBC WITH DIFFERENTIAL     Status: Abnormal   Collection Time: 03/24/16  8:01 AM  Result Value Ref Range   WBC 10.2 4.0 - 10.5 K/uL   RBC 4.78 3.87 - 5.11 MIL/uL   Hemoglobin 13.4 12.0 - 15.0 g/dL   HCT 40.3 36.0 - 46.0 %   MCV 84.3 78.0 - 100.0 fL   MCH 28.0 26.0 - 34.0 pg   MCHC 33.3 30.0 - 36.0 g/dL   RDW 14.4 11.5 - 15.5 %   Platelets 441 (H) 150 - 400 K/uL   Neutrophils Relative % 61 %   Neutro Abs 6.3 1.7 - 7.7 K/uL   Lymphocytes Relative 26 %   Lymphs Abs  2.6 0.7 - 4.0 K/uL   Monocytes Relative 10 %   Monocytes Absolute 1.0 0.1 - 1.0 K/uL   Eosinophils Relative 2 %   Eosinophils Absolute 0.2 0.0 - 0.7 K/uL   Basophils Relative 1 %   Basophils Absolute 0.1 0.0 - 0.1 K/uL  Comprehensive metabolic panel     Status: Abnormal   Collection Time: 03/24/16  8:01 AM  Result Value Ref Range   Sodium 140 135 - 145 mmol/L   Potassium 3.9 3.5 - 5.1 mmol/L   Chloride 108 101 - 111 mmol/L   CO2 21 (L) 22 - 32 mmol/L   Glucose, Bld 137 (H) 65 - 99 mg/dL   BUN 17 6 - 20 mg/dL   Creatinine, Ser 0.93 0.44 - 1.00 mg/dL   Calcium 9.5 8.9 - 10.3 mg/dL   Total Protein 6.9 6.5 - 8.1 g/dL   Albumin 3.7 3.5 - 5.0 g/dL   AST 32 15 - 41 U/L   ALT 22 14 - 54 U/L   Alkaline Phosphatase 115 38 - 126 U/L   Total Bilirubin 0.8 0.3 - 1.2 mg/dL   GFR calc non Af Amer 60 (L) >60 mL/min   GFR calc Af Amer >60 >60 mL/min    Comment: (NOTE) The eGFR has been calculated using the CKD EPI equation. This calculation  has not been validated in all clinical situations. eGFR's persistently <60 mL/min signify possible Chronic Kidney Disease.    Anion gap 11 5 - 15  Glucose, capillary     Status: Abnormal   Collection Time: 03/24/16 12:06 PM  Result Value Ref Range   Glucose-Capillary 106 (H) 65 - 99 mg/dL  Glucose, capillary     Status: Abnormal   Collection Time: 03/24/16  4:53 PM  Result Value Ref Range   Glucose-Capillary 108 (H) 65 - 99 mg/dL  Glucose, capillary     Status: Abnormal   Collection Time: 03/24/16  8:29 PM  Result Value Ref Range   Glucose-Capillary 159 (H) 65 - 99 mg/dL  Glucose, capillary     Status: Abnormal   Collection Time: 03/25/16  6:33 AM  Result Value Ref Range   Glucose-Capillary 114 (H) 65 - 99 mg/dL      General: No acute distress, sleeping Mood and affect are appropriate Heart: Regular rate and rhythm no rubs murmurs or extra sounds Lungs: Clear to auscultation, breathing unlabored, no rales or wheezes Abdomen: Positive bowel  sounds, soft nontender to palpation, nondistended Extremities: No clubbing, cyanosis, or edema Skin: No evidence of breakdown, no evidence of rash Neurologic: Cranial nerves II through XII intact, grip is equal bilateral Speech dysarthric and aphasic  Musculoskeletal: Full range of motion in all 4 extremities. No joint swelling   Assessment/Plan: 1. 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 Physiatrist is providing close team supervision and 24 hour management of active medical problems listed below. Physiatrist and rehab team continue to assess barriers to discharge/monitor patient progress toward functional and medical goals. FIM: Function - Bathing Position: Shower Body parts bathed by patient: Right arm, Left arm, Chest, Abdomen, Front perineal area, Buttocks, Right upper leg, Left lower leg, Right lower leg, Left upper leg Body parts bathed by helper: Back Assist Level: Touching or steadying assistance(Pt > 75%)  Function- Upper Body Dressing/Undressing What is the patient wearing?: Pull over shirt/dress Pull over shirt/dress - Perfomed by patient: Put head through opening, Thread/unthread right sleeve, Thread/unthread left sleeve, Pull shirt over trunk Function - Lower Body Dressing/Undressing What is the patient wearing?: Non-skid slipper socks, Pants, Underwear Underwear - Performed by patient: Thread/unthread right underwear leg, Thread/unthread left underwear leg, Pull underwear up/down Pants- Performed by patient: Thread/unthread right pants leg, Thread/unthread left pants leg, Pull pants up/down Non-skid slipper socks- Performed by patient: Don/doff right sock, Don/doff left sock Assist for lower body dressing: Touching or steadying assistance (Pt > 75%)  Function - Toileting Toileting steps completed by patient: Adjust clothing prior to toileting, Performs  perineal hygiene, Adjust clothing after toileting Assist level: Supervision or verbal cues  Function - Air cabin crew transfer assistive device: Grab bar Assist level to toilet: Touching or steadying assistance (Pt > 75%) Assist level from toilet: Touching or steadying assistance (Pt > 75%)  Function - Chair/bed transfer Chair/bed transfer method: Ambulatory Chair/bed transfer assist level: Supervision or verbal cues Chair/bed transfer assistive device: Bedrails  Function - Locomotion: Wheelchair Will patient use wheelchair at discharge?: No Type: Manual Max wheelchair distance: 134f  Assist Level: Moderate assistance (Pt 50 - 74%) Assist Level: Moderate assistance (Pt 50 - 74%) Assist Level: Moderate assistance (Pt 50 - 74%) Turns around,maneuvers to table,bed, and toilet,negotiates 3% grade,maneuvers on rugs and over doorsills: No Function - Locomotion: Ambulation Assistive device: No device Max distance: 1637f  Assist level: Touching or steadying assistance (Pt > 75%) Assist level: Touching or steadying assistance (Pt > 75%) Assist level: Touching or steadying assistance (Pt > 75%) Assist level: Touching or steadying assistance (Pt > 75%) Assist level: Touching or steadying assistance (Pt > 75%)  Function - Comprehension Comprehension: Auditory Comprehension assist level: Follows basic conversation/direction with extra time/assistive device  Function - Expression Expression: Verbal Expression assist level: Expresses basic 50 - 74% of the time/requires cueing 25 - 49% of the time. Needs to repeat parts of sentences.  Function - Social Interaction Social Interaction assist level: Interacts appropriately 75 - 89% of the time - Needs redirection for appropriate language or to initiate interaction.  Function - Problem Solving Problem solving assist level: Solves basic 75 - 89% of the time/requires cueing 10 - 24% of the time  Function - Memory Memory assist level:  Recognizes or recalls 50 - 74% of the time/requires cueing 25 - 49% of the time Patient normally able to recall (first 3 days only): That he or she is in a hospital Medical Problem List and Plan: 1.  Functional, mobility, and swallowing deficits secondary to left internal capsule infarct             -CIR PT, OT, SLP, will likely d/c in ~ 80moe week, formal care team not performed yesterday 2.  DVT Prophylaxis/Anticoagulation: Pharmaceutical: Lovenox 3. Headaches/fibromyalgia with diffuse pain/Pain Management: Used flexeril prn, hydrocodone prn and cymbalta daily at home for diffuse pain.  Will resume cymbalta.  4. Mood: LCSW to follow for evaluation and support.  5. Neuropsych: This patient is not fully capable of making decisions on her own behalf. 6. Skin/Wound Care: Routine pressure relief measures.  7. Fluids/Electrolytes/Nutrition: Monitor I/O and offer prosource supplement for low protein calorie malnutrition.  8. Blood pressure:   Has been elevated-- to allow for adequate perfusion at this time. Will monitor and add medication as indicated.   Vitals:   03/24/16 1430 03/25/16 0548  BP: (!) 166/61 (!) 148/78  Pulse: 76 71  Resp: 18 18  Temp: 98.4 F (36.9 C) 97.1 F (36.2 C)   9.T2DM: Monitor BS ac/hs. Continue to use SSI for now and resume Amaryl as intake improves and BS start trending upwards. .  CBG (last 3)   Recent Labs  03/24/16 1653 03/24/16 2029 03/25/16 0633  GLUCAP 108* 159* 114*    10. GERD: On Nexium at home--resume PPI. 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  12. Hypokalemia: Likely dilutional. Will recheck  LOS (Days) 2 A FACE TO FACE EVALUATION WAS PERFORMED  Susan Patel E 03/25/2016, 7:26 AM

## 2016-03-25 NOTE — Progress Notes (Signed)
Physical Therapy Session Note  Patient Details  Name: Susan Patel MRN: [id number] Date of Birth: [birth year]  Today's Date: 03/25/2016 PT Individual Time: [id number] PT Individual Time Calculation (min): 55 min    Short Term Goals: Week 1:  PT Short Term Goal 1 (Week 1): STG=LTG due to ELOS   Skilled Therapeutic Interventions/Progress Updates:   Pt received seated on EOB with family present, denies pain and agreeable to treatment. Gait to gym with no AD, close S and noted possible R inattention walking into or very near objects on R side. Standing balance on airex pad while engaged in peg board task building designs based on picture models; 100% accuracy however significantly increased time required for sequencing, problem solving and initiation. Occasional minor LOB posteriorly while standing on airex however recovers balance with ankle strategy and no assist from therapist. Nustep x8 min with BUE/BLE on level 3 with average 45 steps/min for strengthening and aerobic endurance. Dynavision x90 seconds on mode A for visual scanning, attention, reaction time. While performing, pt report she needs to use the restroom. Ambulated in/out of bathroom; pt had incontinent bladder accident and pad was not correctly fit in underwear and therefore leaked into underwear and pants. Returned to room with gait x200' with S. Seated on EOB, pt changed clothes and performed hygiene with S and occasional assist from husband. Seated in w/c at end of session with husband agreeable to transport to day room for stroke support group.   Therapy Documentation Precautions:  Precautions Precautions: Fall Precaution Comments: Impulsive Restrictions Weight Bearing Restrictions: No   See Function Navigator for Current Functional Status.   Therapy/Group: Individual Therapy  Luberta Mutter 03/25/2016, 4:09 PM

## 2016-03-25 NOTE — Progress Notes (Signed)
Occupational Therapy Session Note  Patient Details  Name: Susan Patel MRN: [id number] Date of Birth: 06/24/1940  Today's Date: 03/25/2016 OT Individual Time: 1300-1400 OT Individual Time Calculation (min): 60 min   Short Term Goals: Week 1:  OT Short Term Goal 1 (Week 1): STGs equal to LTGs set at supervision to modified independent level.   Skilled Therapeutic Interventions/Progress Updates:   1:1 OT session focused on functional mobility, problem solving, sequencing, dynamic standing balance, and R hand coordination. 9 hole peg test completed with deficits on R hand ( L hand- 23.40s R hand- 52.60s). Problem solving and fine-motor coordination of R hand using tall-stacker pegs. Pt required min instructional cues to copy image and increased time to correct mistakes. Fine motor coordination, sequencing, and scanning w/ grocery task. Pt able to write ingredients for her potato salad with min questioning cues, increased time, and min assist initially to grip pencil. Pt then used grocery cart to locate  Items placed in hallway. Pt required cues to scan to L and R to locate appropriate items. She then ambulated back to the room with min A and no Ad.   Therapy Documentation Precautions:  Precautions Precautions: Fall Precaution Comments: Impulsive Restrictions Weight Bearing Restrictions: No Pain:  none/denies pain See Function Navigator for Current Functional Status.   Therapy/Group: Individual Therapy  Valma Cava 03/25/2016, 4:37 PM

## 2016-03-26 ENCOUNTER — Inpatient Hospital Stay (HOSPITAL_COMMUNITY): Payer: Medicare Other | Admitting: Physical Therapy

## 2016-03-26 ENCOUNTER — Inpatient Hospital Stay (HOSPITAL_COMMUNITY): Payer: Medicare Other | Admitting: Occupational Therapy

## 2016-03-26 ENCOUNTER — Inpatient Hospital Stay (HOSPITAL_COMMUNITY): Payer: Medicare Other | Admitting: Speech Pathology

## 2016-03-26 LAB — GLUCOSE, CAPILLARY
GLUCOSE-CAPILLARY: 101 mg/dL — AB (ref 65–99)
GLUCOSE-CAPILLARY: 120 mg/dL — AB (ref 65–99)
Glucose-Capillary: 134 mg/dL — ABNORMAL HIGH (ref 65–99)
Glucose-Capillary: 154 mg/dL — ABNORMAL HIGH (ref 65–99)

## 2016-03-26 NOTE — Progress Notes (Signed)
Subjective/Complaints: No issues overnite  ROS- unable to obtain due to speech issues  Objective: Vital Signs: Blood pressure (!) 188/72, pulse 79, temperature 98.1 F (36.7 C), temperature source Oral, resp. rate 17, height '5\' 1"'$  (1.549 m), weight 83.2 kg (183 lb 6.8 oz), SpO2 97 %. No results found. Results for orders placed or performed during the hospital encounter of 03/23/16 (from the past 72 hour(s))  Glucose, capillary     Status: Abnormal   Collection Time: 03/23/16  4:40 PM  Result Value Ref Range   Glucose-Capillary 128 (H) 65 - 99 mg/dL  Glucose, capillary     Status: Abnormal   Collection Time: 03/23/16 10:29 PM  Result Value Ref Range   Glucose-Capillary 175 (H) 65 - 99 mg/dL  Glucose, capillary     Status: Abnormal   Collection Time: 03/24/16  6:56 AM  Result Value Ref Range   Glucose-Capillary 134 (H) 65 - 99 mg/dL  CBC WITH DIFFERENTIAL     Status: Abnormal   Collection Time: 03/24/16  8:01 AM  Result Value Ref Range   WBC 10.2 4.0 - 10.5 K/uL   RBC 4.78 3.87 - 5.11 MIL/uL   Hemoglobin 13.4 12.0 - 15.0 g/dL   HCT 40.3 36.0 - 46.0 %   MCV 84.3 78.0 - 100.0 fL   MCH 28.0 26.0 - 34.0 pg   MCHC 33.3 30.0 - 36.0 g/dL   RDW 14.4 11.5 - 15.5 %   Platelets 441 (H) 150 - 400 K/uL   Neutrophils Relative % 61 %   Neutro Abs 6.3 1.7 - 7.7 K/uL   Lymphocytes Relative 26 %   Lymphs Abs 2.6 0.7 - 4.0 K/uL   Monocytes Relative 10 %   Monocytes Absolute 1.0 0.1 - 1.0 K/uL   Eosinophils Relative 2 %   Eosinophils Absolute 0.2 0.0 - 0.7 K/uL   Basophils Relative 1 %   Basophils Absolute 0.1 0.0 - 0.1 K/uL  Comprehensive metabolic panel     Status: Abnormal   Collection Time: 03/24/16  8:01 AM  Result Value Ref Range   Sodium 140 135 - 145 mmol/L   Potassium 3.9 3.5 - 5.1 mmol/L   Chloride 108 101 - 111 mmol/L   CO2 21 (L) 22 - 32 mmol/L   Glucose, Bld 137 (H) 65 - 99 mg/dL   BUN 17 6 - 20 mg/dL   Creatinine, Ser 0.93 0.44 - 1.00 mg/dL   Calcium 9.5 8.9 - 10.3  mg/dL   Total Protein 6.9 6.5 - 8.1 g/dL   Albumin 3.7 3.5 - 5.0 g/dL   AST 32 15 - 41 U/L   ALT 22 14 - 54 U/L   Alkaline Phosphatase 115 38 - 126 U/L   Total Bilirubin 0.8 0.3 - 1.2 mg/dL   GFR calc non Af Amer 60 (L) >60 mL/min   GFR calc Af Amer >60 >60 mL/min    Comment: (NOTE) The eGFR has been calculated using the CKD EPI equation. This calculation has not been validated in all clinical situations. eGFR's persistently <60 mL/min signify possible Chronic Kidney Disease.    Anion gap 11 5 - 15  Glucose, capillary     Status: Abnormal   Collection Time: 03/24/16 12:06 PM  Result Value Ref Range   Glucose-Capillary 106 (H) 65 - 99 mg/dL  Glucose, capillary     Status: Abnormal   Collection Time: 03/24/16  4:53 PM  Result Value Ref Range   Glucose-Capillary 108 (H) 65 - 99  mg/dL  Glucose, capillary     Status: Abnormal   Collection Time: 03/24/16  8:29 PM  Result Value Ref Range   Glucose-Capillary 159 (H) 65 - 99 mg/dL  Glucose, capillary     Status: Abnormal   Collection Time: 03/25/16  6:33 AM  Result Value Ref Range   Glucose-Capillary 114 (H) 65 - 99 mg/dL  Glucose, capillary     Status: None   Collection Time: 03/25/16 12:10 PM  Result Value Ref Range   Glucose-Capillary 89 65 - 99 mg/dL  Glucose, capillary     Status: Abnormal   Collection Time: 03/25/16  4:59 PM  Result Value Ref Range   Glucose-Capillary 131 (H) 65 - 99 mg/dL  Glucose, capillary     Status: Abnormal   Collection Time: 03/25/16  8:54 PM  Result Value Ref Range   Glucose-Capillary 114 (H) 65 - 99 mg/dL  Glucose, capillary     Status: Abnormal   Collection Time: 03/26/16  6:39 AM  Result Value Ref Range   Glucose-Capillary 134 (H) 65 - 99 mg/dL      General: No acute distress, sleeping Mood and affect are appropriate Heart: Regular rate and rhythm no rubs murmurs or extra sounds Lungs: Clear to auscultation, breathing unlabored, no rales or wheezes Abdomen: Positive bowel sounds, soft  nontender to palpation, nondistended Extremities: No clubbing, cyanosis, or edema Skin: No evidence of breakdown, no evidence of rash Neurologic: Cranial nerves II through XII intact, grip is equal bilateral Speech dysarthric and aphasic  Musculoskeletal: Full range of motion in all 4 extremities. No joint swelling   Assessment/Plan: 1. 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 Physiatrist is providing close team supervision and 24 hour management of active medical problems listed below. Physiatrist and rehab team continue to assess barriers to discharge/monitor patient progress toward functional and medical goals. FIM: Function - Bathing Position: Shower Body parts bathed by patient: Right arm, Left arm, Chest, Abdomen, Front perineal area, Buttocks, Right upper leg, Left lower leg, Right lower leg, Left upper leg Body parts bathed by helper: Back Assist Level: Touching or steadying assistance(Pt > 75%)  Function- Upper Body Dressing/Undressing What is the patient wearing?: Pull over shirt/dress Pull over shirt/dress - Perfomed by patient: Put head through opening, Thread/unthread right sleeve, Thread/unthread left sleeve, Pull shirt over trunk Function - Lower Body Dressing/Undressing What is the patient wearing?: Non-skid slipper socks, Pants, Underwear Underwear - Performed by patient: Thread/unthread right underwear leg, Thread/unthread left underwear leg, Pull underwear up/down Pants- Performed by patient: Thread/unthread right pants leg, Thread/unthread left pants leg, Pull pants up/down Non-skid slipper socks- Performed by patient: Don/doff right sock, Don/doff left sock Assist for lower body dressing: Touching or steadying assistance (Pt > 75%)  Function - Toileting Toileting steps completed by patient: Adjust clothing prior to toileting, Performs perineal hygiene,  Adjust clothing after toileting Toileting Assistive Devices: Grab bar or rail Assist level: Supervision or verbal cues  Function Midwife transfer assistive device: Grab bar Assist level to toilet: Touching or steadying assistance (Pt > 75%) Assist level from toilet: Touching or steadying assistance (Pt > 75%)  Function - Chair/bed transfer Chair/bed transfer method: Ambulatory Chair/bed transfer assist level: Supervision or verbal cues Chair/bed transfer assistive device: Bedrails Chair/bed transfer details: Verbal cues for precautions/safety  Function - Locomotion: Wheelchair Will patient use wheelchair at discharge?: No Type: Manual Max wheelchair distance: 128f  Assist Level: Moderate assistance (Pt 50 - 74%) Assist Level: Moderate assistance (Pt 50 - 74%) Assist Level: Moderate assistance (Pt 50 - 74%) Turns around,maneuvers to table,bed, and toilet,negotiates 3% grade,maneuvers on rugs and over doorsills: No Function - Locomotion: Ambulation Assistive device: No device Max distance: 175 Assist level: Supervision or verbal cues Assist level: Supervision or verbal cues Assist level: Supervision or verbal cues Assist level: Touching or steadying assistance (Pt > 75%) Assist level: Touching or steadying assistance (Pt > 75%)  Function - Comprehension Comprehension: Auditory Comprehension assist level: Follows basic conversation/direction with no assist  Function - Expression Expression: Verbal Expression assist level: Expresses basic 75 - 89% of the time/requires cueing 10 - 24% of the time. Needs helper to occlude trach/needs to repeat words.  Function - Social Interaction Social Interaction assist level: Interacts appropriately 75 - 89% of the time - Needs redirection for appropriate language or to initiate interaction.  Function - Problem Solving Problem solving assist level: Solves basic 75 - 89% of the time/requires cueing 10 - 24% of the  time  Function - Memory Memory assist level: Recognizes or recalls 50 - 74% of the time/requires cueing 25 - 49% of the time Patient normally able to recall (first 3 days only): Current season, Location of own room, Staff names and faces, That he or she is in a hospital Medical Problem List and Plan: 1.  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  DVT Prophylaxis/Anticoagulation: Pharmaceutical: Lovenox 3. Headaches/fibromyalgia with diffuse pain/Pain Management: Used flexeril prn, hydrocodone prn and cymbalta daily at home for diffuse pain.  Will resume cymbalta.  4. Mood: LCSW to follow for evaluation and support.  5. Neuropsych: This patient is not fully capable of making decisions on her own behalf. 6. Skin/Wound Care: Routine pressure relief measures.  7. Fluids/Electrolytes/Nutrition: Monitor I/O and offer prosource supplement for low protein calorie malnutrition.  8. Blood pressure:   Has been elevated-- to allow for adequate perfusion at this time. Will monitor and add medication as indicated.  AM CBG elevated , monitor for trend Vitals:   03/25/16 1605 03/26/16 0537  BP: (!) 112/93 (!) 188/72  Pulse: 70 79  Resp: 18 17  Temp: 98.6 F (37 C) 98.1 F (36.7 C)   9.T2DM: Monitor BS ac/hs. Continue to use SSI for now and resume Amaryl as intake improves and BS CBGs still controlled CBG (last 3)   Recent Labs  03/25/16 1659 03/25/16 2054 03/26/16 0639  GLUCAP 131* 114* 134*    10. GERD: On Nexium at home--resume PPI. 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  12. Hypokalemia: Likely dilutional. Will recheck  LOS (Days) 3 A FACE TO FACE EVALUATION WAS PERFORMED  Susan Patel E 03/26/2016, 7:37 AM

## 2016-03-26 NOTE — Progress Notes (Signed)
Speech Language Pathology Daily Session Note  Patient Details  Name: Susan Patel MRN: [id number] Date of Birth: 26-Mar-1941  Today's Date: 03/26/2016 SLP Individual Time: 0805-0900 SLP Individual Time Calculation (min): 55 min   Short Term Goals: Week 1: SLP Short Term Goal 1 (Week 1): STG=LTG due to ELOS   Skilled Therapeutic Interventions:  Pt was seen for skilled ST targeting communication and dysphagia goals.  SLP facilitated the session with trials of thin liquids to continue working towards repeat objective assessment.  Pt demonstrated x1 immediate reflexive cough following a large cup sip of thin liquids but no other overt s/s of aspiration were evident with small, controlled cup sips.  Recommend initiating the water protocol over the weekend with repeat MBS on 03/29/16.  Pt needed mod assist verbal cues for intelligibility during a picture description task.  Min assist was needed for word finding as well as to recognize and correct verbal errors during task.  Pt needed min assist verbal cues for route recall when ambulating back to her room.  Pt was left in recliner with husband at bedside.  Continue per current plan of care.    Function:  Eating Eating   Modified Consistency Diet: Yes Eating Assist Level: Supervision or verbal cues           Cognition Comprehension Comprehension assist level: Follows basic conversation/direction with no assist  Expression   Expression assist level: Expresses basic 75 - 89% of the time/requires cueing 10 - 24% of the time. Needs helper to occlude trach/needs to repeat words.  Social Interaction Social Interaction assist level: Interacts appropriately 75 - 89% of the time - Needs redirection for appropriate language or to initiate interaction.  Problem Solving Problem solving assist level: Solves basic 75 - 89% of the time/requires cueing 10 - 24% of the time  Memory Memory assist level: Recognizes or recalls 50 - 74% of the time/requires  cueing 25 - 49% of the time    Pain Pain Assessment Pain Assessment: No/denies pain  Therapy/Group: Individual Therapy  Osric Klopf, Selinda Orion 03/26/2016, 12:39 PM

## 2016-03-26 NOTE — IPOC Note (Signed)
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 ([place]) Active Problems:   Benign essential HTN   Chronic obstructive pulmonary disease (HCC)   Dysarthria, post-stroke   Dysphagia, post-stroke     Functional Problem List: Nursing Bladder, Bowel, Edema, Endurance, Medication Management, Pain, Perception, Safety, Skin Integrity, Nutrition  PT Balance, Behavior, Endurance, Motor, Perception, Safety  OT Balance, Cognition, Motor, Safety  SLP Linguistic, Motor, Nutrition  TR         Basic ADL's: OT Grooming, Bathing, Dressing, Toileting, Eating     Advanced  ADL's: OT Simple Meal Preparation, Laundry, Light Housekeeping     Transfers: PT Bed Mobility, Bed to Chair, Floor, Car, Patent attorney, Agricultural engineer: PT Ambulation, Emergency planning/management officer, Stairs     Additional Impairments: OT Fuctional Use of Upper Extremity  SLP Swallowing, Communication comprehension    TR      Anticipated Outcomes Item Anticipated Outcome  Self Feeding independent  Swallowing  mod I    Basic self-care  modified independent  Toileting  modified independent   Bathroom Transfers supervision level  Bowel/Bladder  continent of bowel and bladder with min assist  Transfers  Mod I with LRAD   Locomotion  Mod I- Supervision Assist with LRAD    Communication  min assist   Cognition  supervision   Pain  pain less than or equal to 4/10 with min assist  Safety/Judgment  free from falls/injury and displaying sound safety judgement   Therapy Plan: PT Intensity: Minimum of 1-2 x/day ,45 to 90 minutes PT Frequency: 5 out of 7 days PT Duration Estimated Length of Stay: 7-9 days  OT Intensity: Minimum of 1-2 x/day, 45 to 90 minutes OT Frequency: 5 out of 7 days OT Duration/Estimated Length of Stay: 7-9  days SLP Intensity: Minumum of 1-2 x/day, 30 to 90 minutes SLP Frequency: 3 to 5 out of 7 days SLP Duration/Estimated Length of Stay: 7 days        Team Interventions: Nursing Interventions Patient/Family Education, Bladder Management, Bowel Management, Disease Management/Prevention, Pain Management, Medication Management, Skin Care/Wound Management, Discharge Planning, Cognitive Remediation/Compensation, Dysphagia/Aspiration Precaution Training  PT interventions Ambulation/gait training, Cognitive remediation/compensation, Community reintegration, Discharge planning, Training and development officer, Disease management/prevention, DME/adaptive equipment instruction, Functional mobility training, Neuromuscular re-education, Patient/family education, Psychosocial support, Skin care/wound management, Stair training, Therapeutic Activities, Therapeutic Exercise, Visual/perceptual remediation/compensation, UE/LE Coordination activities, Wheelchair propulsion/positioning, UE/LE Strength taining/ROM  OT Interventions Training and development officer, Self Care/advanced ADL retraining, Therapeutic Activities, Patient/family education, UE/LE Strength taining/ROM, DME/adaptive equipment instruction, UE/LE Coordination activities, Discharge planning, Functional mobility training, Neuromuscular re-education  SLP Interventions Cognitive remediation/compensation, Cueing hierarchy, Dysphagia/aspiration precaution training, Environmental controls, Functional tasks, Patient/family education, Speech/Language facilitation, Multimodal communication approach, Internal/external aids  TR Interventions    SW/CM Interventions Discharge Planning, Psychosocial Support, Patient/Family Education    Team Discharge Planning: Destination: PT-Home ,OT- Home , SLP-Home Projected Follow-up: PT-Home health PT, OT-  24 hour supervision/assistance, SLP-Outpatient SLP, 24 hour supervision/assistance Projected Equipment Needs: PT-To be determined,  OT- To be determined, SLP-To be determined Equipment Details: PT- , OT-  Patient/family involved in discharge planning: PT- Patient, Family member/caregiver,  OT-Patient, Family member/caregiver, SLP-Patient, Family member/caregiver  MD ELOS: 7d Medical Rehab Prognosis:  Excellent Assessment: 75 y.o.femalewith history of T2DM with neuropathy, HTN, fibromyalgia--chronic pain, COPD--oxygen at nights,right visual deficits (negative work up) who was  admitted on 03/20/16 with confusion, right facial droop with slurred speech and word finding deficits as well as difficulty walking. Last seen normal the night before going to bed. 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 Neurology consulted and recommended resuming ASA as patient not compliant with ASA   Now requiring 24/7 Rehab RN,MD, as well as CIR level PT, OT and SLP.  Treatment team will focus on ADLs and mobility with goals set at Mod I  See Team Conference Notes for weekly updates to the plan of care

## 2016-03-26 NOTE — Progress Notes (Signed)
Occupational Therapy Session Note  Patient Details  Name: Susan Patel MRN: [id number] Date of Birth: Aug 02, 1940  Today's Date: 03/26/2016 OT Individual Time:  -   1000-1120  (80 min)       Short Term Goals: Week 1:  OT Short Term Goal 1 (Week 1): STGs equal to LTGs set at supervision to modified independent level.     Skilled Therapeutic Interventions/Progress Updates:    Focus of treatment was bed mobility, transfers,  Neuro-muscular reeducation, sitting balance, standing balance, attention, therapeutic activities, sustained attention, postural control>  [name] to bathroom with SBA and transferred to toilet with SBA.  Completed toileting.  Ambulated to gym with education to walk slowly and perform heel toe stride.  [name] with ankle control and achieving only partial dorsi flexion.  Engaged in [name] with threading shoe laces; simple kitchen activity of making oatmeal and cleaning kitchen; and making bed.  [name] used RUE in all task with no dropping or difficulty manipulating.  Task were performed slow and purposeful  activities  .[name] demonstrated appropriate sequencing   Addressed attention to right and locating items on right.  [name] on speaking clearly and loudly to go over tasks completed in session.  [name] better for a minute and then reverted back to low voice.   Husband present during session.    umentation Precautions:  Precautions Precautions: Fall Precaution Comments: Impulsive Restrictions Weight Bearing Restrictions: No General:     Pain:  none        See Function Navigator for Current Functional Status.   Therapy/Group: Individual Therapy  Lisa Roca 03/26/2016, 11:27 AM

## 2016-03-26 NOTE — Progress Notes (Signed)
Physical Therapy Session Note  Patient Details  Name: Susan Patel MRN: [id number] Date of Birth: 1941/03/29  Today's Date: 03/26/2016 PT Individual Time: 1430-1535 PT Individual Time Calculation (min): 65 min    Short Term Goals: Week 1:  PT Short Term Goal 1 (Week 1): STG=LTG due to ELOS   Skilled Therapeutic Interventions/Progress Updates:   Pt received ambulating in room with no shoes or grip socks on, husband supervising. Husband quickly corrected pt and suggested she go back to sit on EOB and don shoes. Reminded pt and husband of recommendation she wear shoes or grip socks at all time to reduce risk of falls. Gait to/from gym no AD and close S. Performed progressive balance exercises in parallel bars including narrow BOS, tandem stance, eyes open/closed in both conditions. Initially required min guard and use of UEs on rails to catch LOB while performing tandem stance, improved with repetition to 15 sec hold. Heel/toe raises 2x15. Side stepping R/L for hip abduction strengthening. Side stepping and forward/backward stepping over walking stick in preparation for 4 square step test; min guard and repetitive cues for larger steps with side stepping to improve foot placement and reduce shuffling steps. Performed 4 square step test with min verbal cues for sequencing x3 trials overall, pt with difficulty recalling sequencing of pattern. Gait in hallway forward/backward while carrying tennis ball on flat tray for cognitive/physical dual task. Sit <>stand with stability ball throws to therapist for anticipatory postural adjustments and automatic postural reactions, 2x15 reps/. Nustep level 4 with BUE/BLE with pt cued to remain >40 steps/min. Ambulated back to room with S; remained seated in recliner at end of session, husband present.   Therapy Documentation Precautions:  Precautions Precautions: Fall Precaution Comments: Impulsive Restrictions Weight Bearing Restrictions: No Pain: Pain  Assessment Pain Assessment: No/denies pain   See Function Navigator for Current Functional Status.   Therapy/Group: Individual Therapy  Luberta Mutter 03/26/2016, 3:42 PM

## 2016-03-27 ENCOUNTER — Inpatient Hospital Stay (HOSPITAL_COMMUNITY): Payer: Medicare Other | Admitting: Occupational Therapy

## 2016-03-27 ENCOUNTER — Inpatient Hospital Stay (HOSPITAL_COMMUNITY): Payer: Medicare Other

## 2016-03-27 ENCOUNTER — Inpatient Hospital Stay (HOSPITAL_COMMUNITY): Payer: Medicare Other | Admitting: Physical Therapy

## 2016-03-27 LAB — GLUCOSE, CAPILLARY
GLUCOSE-CAPILLARY: 142 mg/dL — AB (ref 65–99)
GLUCOSE-CAPILLARY: 125 mg/dL — AB (ref 65–99)
GLUCOSE-CAPILLARY: 113 mg/dL — AB (ref 65–99)
GLUCOSE-CAPILLARY: 169 mg/dL — AB (ref 65–99)

## 2016-03-27 MED ORDER — POLYETHYLENE GLYCOL 3350 17 G PO PACK
17.0000 g | PACK | Freq: Every day | ORAL | Status: DC | PRN
  Administered 2016-03-28 – 2016-03-29 (×2): 17 g via ORAL
  Filled 2016-03-27 (×3): qty 1

## 2016-03-27 MED ORDER — HYDROCODONE-ACETAMINOPHEN 5-325 MG PO TABS
1.0000 | ORAL_TABLET | Freq: Two times a day (BID) | ORAL | Status: AC | PRN
  Administered 2016-03-27: 1 via ORAL
  Filled 2016-03-27: qty 1

## 2016-03-27 NOTE — Progress Notes (Signed)
Occupational Therapy Session Note  Patient Details  Name: Susan Patel MRN: [id number] Date of Birth: 1941-03-19  Today's Date: 03/27/2016 OT Individual Time:  -   0800-0900  (60 min)  1st session                                            1100-1145  (45 min)  2nd session       Short Term Goals: Week 1:  OT Short Term Goal 1 (Week 1): STGs equal to LTGs set at supervision to modified independent level.  Week 2:     Skilled Therapeutic Interventions/Progress Updates:     1st session:  Focus of treatment was bed mobility, transfers,  Neuro-muscular reeducation, sitting balance, standing balance, attention, therapeutic activities, sustained attention, postural control.  [name] ambulating around room with husband present.  Performed standing at sink for grooming with SBA.  [name] with SBA to NS and asked for pain bills.  RN provided with applesauce.  Ambulated to atrium; provided cues for right foot dorsiflexion. [name] played piano with moderate difficulty.  She plays by ear, but OT unaable to discern a melody.  She had no coordination problems with playing keys.   Ambulated back to room with OT allowing ppt to negotiate path back to her room.  Pt did with no cues.  .  Left ppt on EOB with bed alarm on.    2nd session:  Pt agreed to engage in OT.  Ambualated to OT kitchen.  Engaged in snapping beans using RUE.  Needed minimal assist for organizing and sequencing the cooking of the beans( OT provided cues for heat adjustments, and setting up).  Pt stood for 40 minutes during activity. She ambulated back to room.  Left with husband in room.   Therapy Documentation Precautions:  Precautions Precautions: Fall Precaution Comments: Impulsive Restrictions Weight Bearing Restrictions: No General:   Vital Signs: Therapy Vitals Temp: 98.2 F (36.8 C) Temp Source: Oral Pulse Rate: 72 Resp: 17 BP: (!) 170/74 (notify nurse) Patient Position (if appropriate): Lying Oxygen Therapy SpO2: 96  % O2 Device: Not Delivered Pain: Pain Assessment Pain Assessment: 0-10 Pain Score: 8  Pain Type: Acute pain Pain Location: Leg Pain Orientation: Left;Right Pain Descriptors / Indicators: Aching Pain Onset: On-going Pain Intervention(s): Medication (See eMAR)         See Function Navigator for Current Functional Status.   Therapy/Group: Individual Therapy  Lisa Roca 03/27/2016, 9:14 AM

## 2016-03-27 NOTE — Progress Notes (Signed)
Physical Therapy Session Note  Patient Details  Name: Susan EICHHOLZ MRN: [id number] Date of Birth: 23-Oct-1940  Today's Date: 03/27/2016 PT Individual Time:1615-1700 PT Individual Time Calculation: 45 min       Short Term Goals: Week 1:  PT Short Term Goal 1 (Week 1): STG=LTG due to ELOS   Skilled Therapeutic Interventions/Progress Updates:     Patient received sitting on couch and agreeable to PT. PT instructed pt in gait training in hall through hospital x 543f x2. to find gift shop min-mod cues to utilize maps and signs to locate proper area of hospital. Patient able to retain target destination after cueing from PT. Proper use of elevators noted without instruction from PT  Gait through gift shop and identification of familiar objects with min cues to vocalize names of items x 3013fno LOB noted throughout gait in gift shop. Money management to pay for 2 item in gift shop with min cues for problem solving for best break down to reduce change.   Following treatment, Patient returned to room and left sitting EOB with husband present and all needs met.   Therapy Documentation Precautions:  Precautions Precautions: Fall Precaution Comments: Impulsive Restrictions Weight Bearing Restrictions: No   Pain: Pain Assessment Pain Assessment: 0-10 Pain Score: 7  Pain Type: Acute pain Pain Location: Leg Pain Orientation: Right;Left Pain Descriptors / Indicators: Aching Pain Onset: On-going Pain Intervention(s): Medication (See eMAR)   See Function Navigator for Current Functional Status.   Therapy/Group: Individual Therapy  AuLorie Phenix1/03/2016, 1:00 PM

## 2016-03-27 NOTE — Progress Notes (Signed)
Physical Therapy Note  Patient Details  Name: BEYOUNCE LENSER MRN: [id number] Date of Birth: 1940-07-12 Today's Date: 03/27/2016  1355-1450, 55 min individual tx Pain: none reported  Gait without AD on level tile with close supervision.  Pt distracted and drifting L throughout walk.  neuromuscular re-education via forced use, multimodal cues for alternating reciprocal movement x 4 extremities on NuStep at level 3 x 5 minutes. Neuromuscular re-education via forced use, multimodal cues for : trunk flexion in sitting, bil hip adduction with core activation in sitting, and prolonged stretch bil heel cords in standing on wedge; in standing, LLE lateral kicks to kick over cones.  [place] A exs in standing: 10 x 1 heel/toe raises, R/L hip abduction.  Given external perturbations, pt demonstrated bil ankle strategy, bil hip strategy and LLE stepping strategy.  Dual task to challenge balance: standing on wedge x 5 minutes during construction of PVC pipe tree with min cues. Therapeutic activity for dynmaic balance In standing: retrieving 8 cones from floor using R hand. Advanced gait to return to room, carrying slide board with 5 cups on top, spilling 1.  Pt left resting in recliner, husband present.   Roston Grunewald 03/27/2016, 2:07 PM

## 2016-03-28 ENCOUNTER — Inpatient Hospital Stay (HOSPITAL_COMMUNITY): Payer: Medicare Other | Admitting: Physical Therapy

## 2016-03-28 DIAGNOSIS — G894 Chronic pain syndrome: Secondary | ICD-10-CM

## 2016-03-28 LAB — GLUCOSE, CAPILLARY
GLUCOSE-CAPILLARY: 106 mg/dL — AB (ref 65–99)
GLUCOSE-CAPILLARY: 118 mg/dL — AB (ref 65–99)
Glucose-Capillary: 114 mg/dL — ABNORMAL HIGH (ref 65–99)
Glucose-Capillary: 119 mg/dL — ABNORMAL HIGH (ref 65–99)

## 2016-03-28 MED ORDER — TRAMADOL HCL 50 MG PO TABS: 50.0000 mg | ORAL_TABLET | Freq: Four times a day (QID) | ORAL | Status: DC | PRN

## 2016-03-28 NOTE — Progress Notes (Signed)
Physical Therapy Session Note  Patient Details  Name: Susan Patel MRN: [id number] Date of Birth: 07-15-1940  Today's Date: 03/28/2016 PT Individual Time: 0800-0900 PT Individual Time Calculation (min): 60 min    Short Term Goals: Week 1:  PT Short Term Goal 1 (Week 1): STG=LTG due to ELOS   Skilled Therapeutic Interventions/Progress Updates:    Pt was seen bedside in the am. Pt performed all sit to stand and stand pivot transfers with S. Pt ambulated 200 feet x 3 and 175 feet without assistive device and S, no LOB. Pt performed cone taps and alternating cone taps, 3 sets x 10 reps each for NMR. Pt performed slalom course 50 feet x 3 with S and verbal cues. Pt rode Nu-step 10 minutes at level 3 with B LEs. Pt returned to following treatment. Pt transferred edge of bed to supine with S and verbal cues. Pt left sitting up in bed with call bell within reach and bed alarm on.   Therapy Documentation Precautions:  Precautions Precautions: Fall Precaution Comments: Impulsive Restrictions Weight Bearing Restrictions: No General:   Pain: Pain Assessment Pain Assessment: No/denies pain  See Function Navigator for Current Functional Status.   Therapy/Group: Individual Therapy  Dub Amis 03/28/2016, 11:17 AM

## 2016-03-28 NOTE — Progress Notes (Signed)
Subjective/Complaints: SOme shoulder and knee pain, chronic, states it is from her fibromyalgia Took Hydrocodone at home, discussed fall risk with that med as well as constipation, which she has had  ROS- unable to obtain due to speech issues  Objective: Vital Signs: Blood pressure (!) 134/54, pulse 66, temperature 98.9 F (37.2 C), temperature source Oral, resp. rate 16, height 5\' 1"  (1.549 m), weight 83.2 kg (183 lb 6.8 oz), SpO2 97 %. No results found. Results for orders placed or performed during the hospital encounter of 03/23/16 (from the past 72 hour(s))  Glucose, capillary     Status: None   Collection Time: 03/25/16 12:10 PM  Result Value Ref Range   Glucose-Capillary 89 65 - 99 mg/dL  Glucose, capillary     Status: Abnormal   Collection Time: 03/25/16  4:59 PM  Result Value Ref Range   Glucose-Capillary 131 (H) 65 - 99 mg/dL  Glucose, capillary     Status: Abnormal   Collection Time: 03/25/16  8:54 PM  Result Value Ref Range   Glucose-Capillary 114 (H) 65 - 99 mg/dL  Glucose, capillary     Status: Abnormal   Collection Time: 03/26/16  6:39 AM  Result Value Ref Range   Glucose-Capillary 134 (H) 65 - 99 mg/dL  Glucose, capillary     Status: Abnormal   Collection Time: 03/26/16 11:36 AM  Result Value Ref Range   Glucose-Capillary 101 (H) 65 - 99 mg/dL  Glucose, capillary     Status: Abnormal   Collection Time: 03/26/16  4:45 PM  Result Value Ref Range   Glucose-Capillary 120 (H) 65 - 99 mg/dL  Glucose, capillary     Status: Abnormal   Collection Time: 03/26/16  9:05 PM  Result Value Ref Range   Glucose-Capillary 154 (H) 65 - 99 mg/dL  Glucose, capillary     Status: Abnormal   Collection Time: 03/27/16  6:35 AM  Result Value Ref Range   Glucose-Capillary 142 (H) 65 - 99 mg/dL  Glucose, capillary     Status: Abnormal   Collection Time: 03/27/16 11:30 AM  Result Value Ref Range   Glucose-Capillary 125 (H) 65 - 99 mg/dL  Glucose, capillary     Status: Abnormal    Collection Time: 03/27/16  5:01 PM  Result Value Ref Range   Glucose-Capillary 113 (H) 65 - 99 mg/dL  Glucose, capillary     Status: Abnormal   Collection Time: 03/27/16  8:40 PM  Result Value Ref Range   Glucose-Capillary 169 (H) 65 - 99 mg/dL  Glucose, capillary     Status: Abnormal   Collection Time: 03/28/16  6:38 AM  Result Value Ref Range   Glucose-Capillary 114 (H) 65 - 99 mg/dL      General: No acute distress, sleeping Mood and affect are appropriate Heart: Regular rate and rhythm no rubs murmurs or extra sounds Lungs: Clear to auscultation, breathing unlabored, no rales or wheezes Abdomen: Positive bowel sounds, soft nontender to palpation, nondistended Extremities: No clubbing, cyanosis, or edema Skin: No evidence of breakdown, no evidence of rash Neurologic: Cranial nerves II through XII intact, grip is equal bilateral Speech dysarthric and aphasic  Musculoskeletal: Full range of motion in all 4 extremities. No joint swelling   Assessment/Plan: 1. 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 Physiatrist is providing close team supervision and 24 hour management of active medical problems listed below. Physiatrist and rehab team  continue to assess barriers to discharge/monitor patient progress toward functional and medical goals. FIM: Function - Bathing Position: Shower Body parts bathed by patient: Right arm, Left arm, Chest, Abdomen, Front perineal area, Buttocks, Right upper leg, Left lower leg, Right lower leg, Left upper leg Body parts bathed by helper: Back Assist Level: Touching or steadying assistance(Pt > 75%)  Function- Upper Body Dressing/Undressing What is the patient wearing?: Pull over shirt/dress Pull over shirt/dress - Perfomed by patient: Put head through opening, Thread/unthread right sleeve, Thread/unthread left sleeve, Pull shirt over  trunk Assist Level: Supervision or verbal cues Function - Lower Body Dressing/Undressing What is the patient wearing?: Non-skid slipper socks, Pants, Underwear Underwear - Performed by patient: Thread/unthread right underwear leg, Thread/unthread left underwear leg, Pull underwear up/down Pants- Performed by patient: Thread/unthread right pants leg, Thread/unthread left pants leg, Pull pants up/down Non-skid slipper socks- Performed by patient: Don/doff right sock, Don/doff left sock Assist for lower body dressing: Touching or steadying assistance (Pt > 75%)  Function - Toileting Toileting steps completed by patient: Adjust clothing prior to toileting, Performs perineal hygiene, Adjust clothing after toileting Toileting steps completed by helper: Adjust clothing prior to toileting, Adjust clothing after toileting (per Candice Applewhite, NT) Toileting Assistive Devices: Grab bar or rail Assist level: Supervision or verbal cues  Function Midwife transfer assistive device: Grab bar Assist level to toilet: Touching or steadying assistance (Pt > 75%) Assist level from toilet: Touching or steadying assistance (Pt > 75%)  Function - Chair/bed transfer Chair/bed transfer method: Ambulatory Chair/bed transfer assist level: Supervision or verbal cues Chair/bed transfer assistive device: Bedrails Chair/bed transfer details: Verbal cues for precautions/safety  Function - Locomotion: Wheelchair Will patient use wheelchair at discharge?: No Type: Manual Max wheelchair distance: 129ft  Assist Level: Moderate assistance (Pt 50 - 74%) Assist Level: Moderate assistance (Pt 50 - 74%) Assist Level: Moderate assistance (Pt 50 - 74%) Turns around,maneuvers to table,bed, and toilet,negotiates 3% grade,maneuvers on rugs and over doorsills: No Function - Locomotion: Ambulation Assistive device: No device Max distance: 150 Assist level: Supervision or verbal cues Assist level:  Supervision or verbal cues Assist level: Supervision or verbal cues Assist level: Supervision or verbal cues Assist level: Touching or steadying assistance (Pt > 75%)  Function - Comprehension Comprehension: Auditory Comprehension assist level: Follows complex conversation/direction with no assist  Function - Expression Expression: Verbal Expression assist level: Expresses basic 75 - 89% of the time/requires cueing 10 - 24% of the time. Needs helper to occlude trach/needs to repeat words.  Function - Social Interaction Social Interaction assist level: Interacts appropriately 75 - 89% of the time - Needs redirection for appropriate language or to initiate interaction.  Function - Problem Solving Problem solving assist level: Solves basic 75 - 89% of the time/requires cueing 10 - 24% of the time  Function - Memory Memory assist level: Recognizes or recalls 25 - 49% of the time/requires cueing 50 - 75% of the time Patient normally able to recall (first 3 days only): Current season, Location of own room, Staff names and faces, That he or she is in a hospital Medical Problem List and Plan: 1.  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  DVT Prophylaxis/Anticoagulation: Pharmaceutical: Lovenox 3. Headaches/fibromyalgia with diffuse pain/Pain Management: Used flexeril prn,  hydrocodone prn and cymbalta daily at home for diffuse pain. Change hydrocodone to tramadol  Will resume cymbalta.  4. Mood: LCSW to follow for evaluation and support.  5. Neuropsych: This patient is not fully capable of making decisions on her own behalf. 6. Skin/Wound Care: Routine pressure relief measures.  7. Fluids/Electrolytes/Nutrition: Monitor I/O and offer prosource supplement for low protein calorie malnutrition.  8. Blood pressure:   Has been elevated-- to allow for adequate  perfusion at this time. Will monitor and add medication as indicated.  AM CBG elevated , monitor for trend Vitals:   03/27/16 1313 03/28/16 0545  BP: (!) 146/51 (!) 134/54  Pulse: 67 66  Resp: 18 16  Temp: 97.7 F (36.5 C) 98.9 F (37.2 C)   9.T2DM: Monitor BS ac/hs. Continue to use SSI for now and resume Amaryl as intake improves and BS CBGs still controlled CBG (last 3)   Recent Labs  03/27/16 1701 03/27/16 2040 03/28/16 0638  GLUCAP 113* 169* 114*    10. GERD: On Nexium at home--resume PPI. 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  12. Hypokalemia: Likely dilutional. Will recheck  LOS (Days) 5 A FACE TO FACE EVALUATION WAS PERFORMED  Jayson Waterhouse E 03/28/2016, 8:09 AM

## 2016-03-29 ENCOUNTER — Inpatient Hospital Stay (HOSPITAL_COMMUNITY): Payer: Medicare Other | Admitting: Speech Pathology

## 2016-03-29 ENCOUNTER — Inpatient Hospital Stay (HOSPITAL_COMMUNITY): Payer: Medicare Other | Admitting: Physical Therapy

## 2016-03-29 ENCOUNTER — Inpatient Hospital Stay (HOSPITAL_COMMUNITY): Payer: Medicare Other | Admitting: Occupational Therapy

## 2016-03-29 ENCOUNTER — Inpatient Hospital Stay (HOSPITAL_COMMUNITY): Payer: Medicare Other

## 2016-03-29 DIAGNOSIS — I69398 Other sequelae of cerebral infarction: Secondary | ICD-10-CM

## 2016-03-29 DIAGNOSIS — R269 Unspecified abnormalities of gait and mobility: Secondary | ICD-10-CM

## 2016-03-29 LAB — GLUCOSE, CAPILLARY
GLUCOSE-CAPILLARY: 97 mg/dL (ref 65–99)
GLUCOSE-CAPILLARY: 138 mg/dL — AB (ref 65–99)
Glucose-Capillary: 134 mg/dL — ABNORMAL HIGH (ref 65–99)
Glucose-Capillary: 121 mg/dL — ABNORMAL HIGH (ref 65–99)

## 2016-03-29 MED ORDER — SORBITOL 70 % SOLN
30.0000 mL | Freq: Every day | Status: DC | PRN
  Administered 2016-03-29: 30 mL via ORAL
  Filled 2016-03-29: qty 30

## 2016-03-29 NOTE — Progress Notes (Signed)
Subjective/Complaints: No issues overnite, no BM x 3 d, pain controlled by tramadol  ROS- unable to obtain due to speech issues  Objective: Vital Signs: Blood pressure 138/68, pulse 69, temperature 98.9 F (37.2 C), temperature source Oral, resp. rate 18, height 5\' 1"  (1.549 m), weight 83.2 kg (183 lb 6.8 oz), SpO2 99 %. No results found. Results for orders placed or performed during the hospital encounter of 03/23/16 (from the past 72 hour(s))  Glucose, capillary     Status: Abnormal   Collection Time: 03/26/16  6:39 AM  Result Value Ref Range   Glucose-Capillary 134 (H) 65 - 99 mg/dL  Glucose, capillary     Status: Abnormal   Collection Time: 03/26/16 11:36 AM  Result Value Ref Range   Glucose-Capillary 101 (H) 65 - 99 mg/dL  Glucose, capillary     Status: Abnormal   Collection Time: 03/26/16  4:45 PM  Result Value Ref Range   Glucose-Capillary 120 (H) 65 - 99 mg/dL  Glucose, capillary     Status: Abnormal   Collection Time: 03/26/16  9:05 PM  Result Value Ref Range   Glucose-Capillary 154 (H) 65 - 99 mg/dL  Glucose, capillary     Status: Abnormal   Collection Time: 03/27/16  6:35 AM  Result Value Ref Range   Glucose-Capillary 142 (H) 65 - 99 mg/dL  Glucose, capillary     Status: Abnormal   Collection Time: 03/27/16 11:30 AM  Result Value Ref Range   Glucose-Capillary 125 (H) 65 - 99 mg/dL  Glucose, capillary     Status: Abnormal   Collection Time: 03/27/16  5:01 PM  Result Value Ref Range   Glucose-Capillary 113 (H) 65 - 99 mg/dL  Glucose, capillary     Status: Abnormal   Collection Time: 03/27/16  8:40 PM  Result Value Ref Range   Glucose-Capillary 169 (H) 65 - 99 mg/dL  Glucose, capillary     Status: Abnormal   Collection Time: 03/28/16  6:38 AM  Result Value Ref Range   Glucose-Capillary 114 (H) 65 - 99 mg/dL  Glucose, capillary     Status: Abnormal   Collection Time: 03/28/16 11:37 AM  Result Value Ref Range   Glucose-Capillary 119 (H) 65 - 99 mg/dL   Glucose, capillary     Status: Abnormal   Collection Time: 03/28/16  4:52 PM  Result Value Ref Range   Glucose-Capillary 106 (H) 65 - 99 mg/dL  Glucose, capillary     Status: Abnormal   Collection Time: 03/28/16  8:36 PM  Result Value Ref Range   Glucose-Capillary 118 (H) 65 - 99 mg/dL      General: No acute distress, sleeping Mood and affect are appropriate Heart: Regular rate and rhythm no rubs murmurs or extra sounds Lungs: Clear to auscultation, breathing unlabored, no rales or wheezes Abdomen: Positive bowel sounds, soft nontender to palpation, nondistended Extremities: No clubbing, cyanosis, or edema Skin: No evidence of breakdown, no evidence of rash Neurologic: Cranial nerves II through XII intact, grip is equal bilateral Speech dysarthric and aphasic  Musculoskeletal: Full range of motion in all 4 extremities. No joint swelling   Assessment/Plan: 1. 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 Physiatrist is providing close team supervision and 24 hour management of active medical problems listed below. Physiatrist and rehab team continue to assess barriers to discharge/monitor patient progress toward functional and medical goals. FIM: Function - Bathing Position:  Shower Body parts bathed by patient: Right arm, Left arm, Chest, Abdomen, Front perineal area, Buttocks, Right upper leg, Left lower leg, Right lower leg, Left upper leg Body parts bathed by helper: Back Assist Level: Touching or steadying assistance(Pt > 75%)  Function- Upper Body Dressing/Undressing What is the patient wearing?: Pull over shirt/dress Pull over shirt/dress - Perfomed by patient: Put head through opening, Thread/unthread right sleeve, Thread/unthread left sleeve, Pull shirt over trunk Assist Level: Supervision or verbal cues Function - Lower Body Dressing/Undressing What is the  patient wearing?: Non-skid slipper socks, Pants, Underwear Underwear - Performed by patient: Thread/unthread right underwear leg, Thread/unthread left underwear leg, Pull underwear up/down Pants- Performed by patient: Thread/unthread right pants leg, Thread/unthread left pants leg, Pull pants up/down Non-skid slipper socks- Performed by patient: Don/doff right sock, Don/doff left sock Assist for lower body dressing: Touching or steadying assistance (Pt > 75%)  Function - Toileting Toileting steps completed by patient: Adjust clothing prior to toileting, Performs perineal hygiene, Adjust clothing after toileting Toileting steps completed by helper: Adjust clothing prior to toileting, Adjust clothing after toileting (per Candice Applewhite, NT) Toileting Assistive Devices: Grab bar or rail Assist level: Supervision or verbal cues  Function Midwife transfer assistive device: Grab bar Assist level to toilet: Touching or steadying assistance (Pt > 75%) Assist level from toilet: Touching or steadying assistance (Pt > 75%)  Function - Chair/bed transfer Chair/bed transfer method: Ambulatory Chair/bed transfer assist level: Supervision or verbal cues Chair/bed transfer assistive device: Bedrails Chair/bed transfer details: Verbal cues for precautions/safety  Function - Locomotion: Wheelchair Will patient use wheelchair at discharge?: No Type: Manual Max wheelchair distance: 138ft  Assist Level: Moderate assistance (Pt 50 - 74%) Assist Level: Moderate assistance (Pt 50 - 74%) Assist Level: Moderate assistance (Pt 50 - 74%) Turns around,maneuvers to table,bed, and toilet,negotiates 3% grade,maneuvers on rugs and over doorsills: No Function - Locomotion: Ambulation Assistive device: No device Max distance: 200 Assist level: Supervision or verbal cues Assist level: Supervision or verbal cues Assist level: Supervision or verbal cues Assist level: Supervision or verbal  cues Assist level: Touching or steadying assistance (Pt > 75%)  Function - Comprehension Comprehension: Auditory Comprehension assist level: Follows basic conversation/direction with extra time/assistive device  Function - Expression Expression: Verbal Expression assist level: Expresses basic 75 - 89% of the time/requires cueing 10 - 24% of the time. Needs helper to occlude trach/needs to repeat words.  Function - Social Interaction Social Interaction assist level: Interacts appropriately 75 - 89% of the time - Needs redirection for appropriate language or to initiate interaction.  Function - Problem Solving Problem solving assist level: Solves basic 75 - 89% of the time/requires cueing 10 - 24% of the time  Function - Memory Memory assist level: Recognizes or recalls 50 - 74% of the time/requires cueing 25 - 49% of the time Patient normally able to recall (first 3 days only): Current season, Location of own room, Staff names and faces, That he or she is in a hospital Medical Problem List and Plan: 1.  Functional, mobility, and swallowing deficits secondary to left internal capsule infarct             -CIR PT, OT, SLP,        2.  DVT Prophylaxis/Anticoagulation: Pharmaceutical: Lovenox 3. Headaches/fibromyalgia with diffuse pain/Pain Management: Used flexeril prn, hydrocodone prn and cymbalta daily at home for diffuse pain. cont tramadol ,  Cont low dose cymbalta.  4. Mood: LCSW to follow for evaluation  and support.  5. Neuropsych: This patient is not fully capable of making decisions on her own behalf. 6. Skin/Wound Care: Routine pressure relief measures.  7. Fluids/Electrolytes/Nutrition: Monitor I/O and offer prosource supplement for low protein calorie malnutrition.  8. Blood pressure:   Has been elevated-- to allow for adequate perfusion at this time. Will monitor and add medication as indicated. Vitals:   03/28/16 0545 03/29/16 0541  BP: (!) 134/54 138/68  Pulse: 66 69  Resp:  16 18  Temp: 98.9 F (37.2 C) 98.9 F (37.2 C)   9.T2DM: Monitor BS ac/hs. Continue to use SSI for now and resume Amaryl as intake improves and BS CBG (last 3)   Recent Labs  03/28/16 1137 03/28/16 1652 03/28/16 2036  GLUCAP 119* 106* 118*    10. GERD: On Nexium at home--resume PPI. 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 Lungs clear, sats good 12. Hypokalemia: Likely dilutional. Will recheck 13.  Constipation- sorbitol today LOS (Days) 6 A FACE TO FACE EVALUATION WAS PERFORMED  Abelardo Seidner E 03/29/2016, 6:30 AM

## 2016-03-29 NOTE — Progress Notes (Signed)
Speech Language Pathology Note  Patient Details  Name: Susan Patel MRN: [id number] Date of Birth: June 07, 1940 Today's Date: 03/29/2016  MBSS complete. Full report located under chart review in imaging section.    Dailah Opperman, Selinda Orion 03/29/2016, 10:02 AM

## 2016-03-29 NOTE — Progress Notes (Signed)
Occupational Therapy Session Note  Patient Details  Name: Susan Patel MRN: [id number] Date of Birth: Mar 03, 1941  Today's Date: 03/29/2016 OT Individual Time: 0732-0830 OT Individual Time Calculation (min): 58 min     Short Term Goals: Week 1:  OT Short Term Goal 1 (Week 1): STGs equal to LTGs set at supervision to modified independent level.   Skilled Therapeutic Interventions/Progress Updates:    Treatment session with focus on dynamic standing balance and sequencing during familiar and novel tasks.  Pt received seated in w/c at sink applying makeup.  Therapist encouraged pt to stand to complete task.  Pt required increased time with sequencing makeup and when cleaning up.  Min cues for attention to spilled makeup on shirt.  Pt ambulated to therapy gym without AD and supervision.  Engaged in visuo-cognitive task with replicating picture with use of colored pegs.  Pt utilized Lt hand throughout activity with mod cues to utilize only LUE as pt would reposition pegs with Rt hand.  Pt demonstrated good sequencing with picture task, requiring min cues for problem solving during 2nd more complex pattern.  Utilized money for problem solving and sequencing with counting coins and dollar bills as well as figuring out various scenarios and providing appropriate monetary amounts.  Pt required min cues during initial scenario and was able to complete following 2 without additional cues.  Returned to room and left seated at EOB to complete breakfast tray.  Therapy Documentation Precautions:  Precautions Precautions: Fall Precaution Comments: Impulsive Restrictions Weight Bearing Restrictions: No General:   Vital Signs: Therapy Vitals Temp: 97.5 F (36.4 C) Temp Source: Oral Pulse Rate: 65 Resp: 17 BP: (!) 164/58 Patient Position (if appropriate): Lying Oxygen Therapy SpO2: 100 % O2 Device: Not Delivered Pain:   Pt with c/o pain in BLE, RN aware. Limited activity within tolerance  See  Function Navigator for Current Functional Status.   Therapy/Group: Individual Therapy  Simonne Come 03/29/2016, 3:23 PM

## 2016-03-29 NOTE — Progress Notes (Signed)
Physical Therapy Session Note  Patient Details  Name: Susan Patel MRN: [id number] Date of Birth: 1941/01/24  Today's Date: 03/29/2016 PT Individual Time: 1015-1130 PT Individual Time Calculation (min): 75 min    Short Term Goals: Week 1:  PT Short Term Goal 1 (Week 1): STG=LTG due to ELOS   Skilled Therapeutic Interventions/Progress Updates:    Pt resting edge of bed on arrival, no c/o pain, and agreeable to therapy session.  Session focus on core strength, trunk elongation/shortenting/rotation, high level balance, and cognitive tasks.    Gait training throughout unit, max distance 200' on even tile, with distant supervision.  Backwards walking 4x25' with no rest breaks and initial steady assist fade to close supervision focus on R=L step length and head turns in distracting environment.    Core strengthening/NMR with reaching task in sitting and in R side lying focus on activation of core muscles for elongation, shortening, and rotation.  Throwing/catching tennis ball in sitting with cognitive challenge naming animals (mod cues to recall instructions), and throwing/catching beach ball in standing focus on anticipatory adjustments and increased challenge with cognitive task counting number of catches.    Nustep x10 minutes at level 4 with 4 extremities focus on attention to task maintaining steps/minute >40 and focus on forced use and reciprocal stepping motion.    Pt returned to room at end of session and positioned edge of bed.  Pt took one large sip of thin water with strong cough noted, and pt requiring up to 3 minutes to successfully clear throat.  Pt able to take one smaller sip without signs/symptoms of aspiration.  Pt left sitting EOB with call bell in reach and needs met.  Therapy Documentation Precautions:  Precautions Precautions: Fall Precaution Comments: Impulsive Restrictions Weight Bearing Restrictions: No   See Function Navigator for Current Functional  Status.   Therapy/Group: Individual Therapy  Earnest Conroy Penven-Crew 03/29/2016, 12:07 PM

## 2016-03-29 NOTE — Progress Notes (Signed)
Speech Language Pathology Daily Session Note  Patient Details  Name: JANAEH STEINHART MRN: [id number] Date of Birth: 12-22-1940  Today's Date: 03/29/2016 SLP Individual Time: [id number] SLP Individual Time Calculation (min): 54 min   Short Term Goals: Week 1: SLP Short Term Goal 1 (Week 1): STG=LTG due to ELOS   Skilled Therapeutic Interventions:Skilled therapy intervention focused on speech and cognitive goals. Given an item description task, patient independently verbalized at the sentence level given Mod A verbal cues for verbal description. Patient evidenced minimal groping for word retrieval while expressing herself at the phrase and sentence level. Patient was 75% intelligible at the sentence level and reports that she has not recently had difficulty communicating with friends and family. Patient left supine in bed with bed alarm on and visiting friend in room. Continue current plan of care.   Function:  Cognition Comprehension Comprehension assist level: Follows basic conversation/direction with no assist  Expression   Expression assist level: Expresses basic 75 - 89% of the time/requires cueing 10 - 24% of the time. Needs helper to occlude trach/needs to repeat words.  Social Interaction Social Interaction assist level: Interacts appropriately 75 - 89% of the time - Needs redirection for appropriate language or to initiate interaction.  Problem Solving Problem solving assist level: Solves basic 75 - 89% of the time/requires cueing 10 - 24% of the time  Memory Memory assist level: Recognizes or recalls 50 - 74% of the time/requires cueing 25 - 49% of the time    Pain Pain Assessment Pain Assessment: No/denies pain  Therapy/Group: Individual Therapy  Thornton Papas 03/29/2016, 4:39 PM

## 2016-03-30 ENCOUNTER — Inpatient Hospital Stay (HOSPITAL_COMMUNITY): Payer: Medicare Other | Admitting: Occupational Therapy

## 2016-03-30 ENCOUNTER — Inpatient Hospital Stay (HOSPITAL_COMMUNITY): Payer: Medicare Other | Admitting: Speech Pathology

## 2016-03-30 ENCOUNTER — Encounter (HOSPITAL_COMMUNITY): Payer: Medicare Other | Admitting: *Deleted

## 2016-03-30 LAB — BASIC METABOLIC PANEL
Anion gap: 5 (ref 5–15)
BUN: 18 mg/dL (ref 6–20)
CALCIUM: 9.2 mg/dL (ref 8.9–10.3)
CO2: 30 mmol/L (ref 22–32)
CREATININE: 0.85 mg/dL (ref 0.44–1.00)
Chloride: 106 mmol/L (ref 101–111)
GFR calc Af Amer: >60 mL/min (ref >60)
GFR calc non Af Amer: >60 mL/min (ref >60)
GLUCOSE: 118 mg/dL — AB (ref 65–99)
Potassium: 4.3 mmol/L (ref 3.5–5.1)
Sodium: 141 mmol/L (ref 135–145)

## 2016-03-30 LAB — GLUCOSE, CAPILLARY
GLUCOSE-CAPILLARY: 107 mg/dL — AB (ref 65–99)
Glucose-Capillary: 120 mg/dL — ABNORMAL HIGH (ref 65–99)
Glucose-Capillary: 117 mg/dL — ABNORMAL HIGH (ref 65–99)
Glucose-Capillary: 126 mg/dL — ABNORMAL HIGH (ref 65–99)

## 2016-03-30 LAB — CBC
HCT: 35.8 % — ABNORMAL LOW (ref 36.0–46.0)
Hemoglobin: 11.6 g/dL — ABNORMAL LOW (ref 12.0–15.0)
MCH: 27.8 pg (ref 26.0–34.0)
MCHC: 32.4 g/dL (ref 30.0–36.0)
MCV: 85.6 fL (ref 78.0–100.0)
PLATELETS: 366 10*3/uL (ref 150–400)
RBC: 4.18 MIL/uL (ref 3.87–5.11)
RDW: 14.3 % (ref 11.5–15.5)
WBC: 8 10*3/uL (ref 4.0–10.5)

## 2016-03-30 LAB — CREATININE, SERUM: CREATININE: 0.88 mg/dL (ref 0.44–1.00)

## 2016-03-30 NOTE — Progress Notes (Signed)
Speech Language Pathology Discharge Summary  Patient Details  Name: Susan Patel MRN: [id number] Date of Birth: Apr 15, 1941  Today's Date: 03/30/2016 SLP Individual Time: 1320-1405 SLP Individual Time Calculation (min): 45 min    Skilled Therapeutic Interventions:  Pt was seen for skilled ST targeting goals for dysphagia and communication.  SLP facilitated the session with a functional snack of regular textures and thin liquids to assess toleration of diet following yesterday's MBS.  Pt consumed current textures with supervision verbal cues for use of swallowing precautions.  Pt demonstrated no overt s/s of aspiration with small controlled cup sips of thin liquids; however, she did demonstrate immediate, strong reflexive coughing with mixed consistencies and larger sips of thins which is consistent with delay in swallow initiation on yesterday's study.  Reviewed and reinforced results from [facility] with pt and pt's husband.  Pt's husband reports good toleration of upgrade since yesterday but does endorse needing to provide pt with intermittent cues for rate and portion control due to pt's impulsivity.  SLP also facilitated the session with a novel descriptive naming task.  Pt needed mod assist for phrase formulation as pt was noted to respond at the simple word level during task.  Min assist verbal cues were also needed for intelligibility during task due to decreased vocal intensity and imprecise articulation of consonants.  Pt was returned to room and left in bed with husband at bedside.  SLP discussed compensatory techniques to maximize pt's functional communication, including allowing pt extra time for communication and setting up opportunities for pt that require her to initiate a request.  Pt's husband verbalized understanding.  All questions were answered to pt's and husband's satisfaction at this time.     Patient has met 2 of 5 long term goals.  Patient to discharge at [facility] level.  Reasons  goals not met: pt needs min assist for functional communication and intermittent supervision level cues for use of swallowing precautions   Clinical Impression/Discharge Summary:   Pt has made functional gains while inpatient and is discharging having met 2 out of 5 long term goals.  Pt's diet has been upgraded to regular textures and thin liquids per MBS on 11/13 which she is consuming with supervision cues for use of swallowing precautions due to impulsivity with intake.  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  Pt is discharging home with 24/7 supervision from family.  Recommend ongoing ST follow up at next level of care to address dysphagia as well as cognitive-linguistic goals.  Pt and family education is complete at this time.    Care Partner:  Caregiver Able to Provide Assistance: Yes  Type of Caregiver Assistance: Physical;Cognitive  Recommendation:  Outpatient SLP;24 hour supervision/assistance  Rationale for SLP Follow Up: Maximize functional communication;Maximize cognitive function and independence;Maximize swallowing safety;Reduce caregiver burden   Equipment: none recommended by SLP    Reasons for discharge: Discharged from hospital   Patient/Family Agrees with Progress Made and Goals Achieved: Yes   Function:  Eating Eating   Modified Consistency Diet: No Eating Assist Level: Supervision or verbal cues           Cognition Comprehension Comprehension assist level: Follows basic conversation/direction with extra time/assistive device  Expression   Expression assist level: Expresses basic 75 - 89% of the time/requires cueing 10 - 24% of the time. Needs helper to occlude trach/needs to  repeat words.  Social Interaction Social Interaction assist level: Interacts appropriately 75 - 89% of the time - Needs redirection  for appropriate language or to initiate interaction.  Problem Solving Problem solving assist level: Solves basic 75 - 89% of the time/requires cueing 10 - 24% of the time  Memory Memory assist level: Recognizes or recalls 50 - 74% of the time/requires cueing 25 - 49% of the time   Emilio Math 03/30/2016, 3:49 PM

## 2016-03-30 NOTE — Progress Notes (Signed)
Physical Therapy Discharge Summary  Patient Details  Name: Susan Patel MRN: [id number] Date of Birth: 1941-01-20   Patient has met 8 of 10 long term goals due to improved activity tolerance, improved balance, improved postural control, increased strength, ability to compensate for deficits, functional use of  right upper extremity and right lower extremity, improved attention, improved awareness and improved coordination.  Patient to discharge at an ambulatory level supervision>mod I.   Patient's care partner is independent to provide the necessary cognitive assistance at discharge.  Reasons goals not met:  Due to pt's short length of stay, did not reassess BERG, and did not attempt floor transfer.   Recommendation:  Patient will benefit from ongoing skilled PT services in outpatient setting to continue to advance safe functional mobility, address ongoing impairments in balance, awareness, and problem solving, and minimize fall risk.  Equipment: No equipment provided  Reasons for discharge: treatment goals met and discharge from hospital  Patient/family agrees with progress made and goals achieved: Yes  PT Discharge Precautions/Restrictions Precautions Precautions: None Restrictions Weight Bearing Restrictions: No Pain Pain Assessment Pain Assessment: No/denies pain  Cognition Overall Cognitive Status: Impaired/Different from baseline Arousal/Alertness: Awake/alert Orientation Level: Oriented X4 Attention: Sustained Sustained Attention: Appears intact Memory: Impaired Memory Impairment: Storage deficit;Retrieval deficit Awareness: Impaired Awareness Impairment: Emergent impairment Problem Solving: Impaired Problem Solving Impairment: Functional basic;Verbal basic Executive Function: Self Monitoring;Self Correcting Self Monitoring: Impaired Self Monitoring Impairment: Verbal basic;Functional basic Behaviors: Impulsive Safety/Judgment:  Impaired Sensation Coordination Gross Motor Movements are Fluid and Coordinated: Yes Fine Motor Movements are Fluid and Coordinated: Yes Coordination and Movement Description: slower movement  Motor  Motor Motor: Within Functional Limits  Mobility Bed Mobility Rolling Right: 6: Modified independent (Device/Increase time) Rolling Left: 6: Modified independent (Device/Increase time) Supine to Sit: 6: Modified independent (Device/Increase time) Sit to Supine: 6: Modified independent (Device/Increase time) Transfers Transfers: Yes Sit to Stand: 6: Modified independent (Device/Increase time) Stand to Sit: 6: Modified independent (Device/Increase time) Stand Pivot Transfers: 6: Modified independent (Device/Increase time) Locomotion  Ambulation Ambulation: Yes Ambulation/Gait Assistance: 6: Modified independent (Device/Increase time);5: Supervision (supervision in community setting) Ambulation Distance (Feet): 1000 Feet Assistive device: None Gait Gait: Yes Gait Pattern: Impaired Gait Pattern: Shuffle;Poor foot clearance - left;Poor foot clearance - right Stairs / Additional Locomotion Stairs: Yes Stairs Assistance: 4: Min assist (HHA on van steps) Stair Management Technique: Other (comment) Number of Stairs: 4 Ramp: 6: Modified independent (Device) Curb: 6: Modified independent (Device/increase time) Product manager Mobility: No  Trunk/Postural Assessment  Cervical Assessment Cervical Assessment: Within Functional Limits Thoracic Assessment Thoracic Assessment: Within Functional Limits Postural Control Postural Control: Deficits on evaluation Righting Reactions: delayed Protective Responses: delayed  Balance Balance Balance Assessed: Yes Static Sitting Balance Static Sitting - Balance Support: No upper extremity supported Static Sitting - Level of Assistance: 6: Modified independent (Device/Increase time) Dynamic Sitting Balance Dynamic Sitting -  Balance Support: During functional activity;No upper extremity supported Dynamic Sitting - Level of Assistance: 6: Modified independent (Device/Increase time) Static Standing Balance Static Standing - Balance Support: During functional activity;No upper extremity supported Static Standing - Level of Assistance: 6: Modified independent (Device/Increase time) Dynamic Standing Balance Dynamic Standing - Balance Support: During functional activity;No upper extremity supported Dynamic Standing - Level of Assistance: 6: Modified independent (Device/Increase time)   See Function Navigator for Current Functional Status.  Avelardo Reesman E Penven-Crew 03/30/2016, 1:22 PM

## 2016-03-30 NOTE — Progress Notes (Signed)
Social Work  Discharge Note  The overall goal for the admission was met for:   Discharge location: Yes-HOME WITH HUSBAND WHO CAN PROVIDE 24 HR SUPERVISION   Length of Stay: Yes-8 DAYS  Discharge activity level: Yes-MOD/I-SUPERVISION LEVEL  Home/community participation: Yes  Services provided included: MD, RD, PT, OT, SLP, RN, CM, TR, Pharmacy and SW  Financial Services: Medicare and Private Insurance: [employer]  Follow-up services arranged: Outpatient: [facility]-11/20 MONDAY 1:45 PM THEN WORK IN PT APPOINTMENT  Comments (or additional information):HUSBAND HAS BEEN HERE DAILY AND PARTICIPATED IN THERAPIES WITH PT, BOTH FEEL COMFORTABLE ABOUT GOING HOME AND HER CARE NEEDS. BOTH VERY PLEASED WITH THE PROGRESS SHE MADE WHILE HERE.  Patient/Family verbalized understanding of follow-up arrangements: Yes  Individual responsible for coordination of the follow-up plan: WAYNE-HUSBAND  Confirmed correct DME delivered: Elease Hashimoto 03/30/2016    Elease Hashimoto

## 2016-03-30 NOTE — Progress Notes (Addendum)
Occupational Therapy Session Note  Patient Details  Name: Susan Patel MRN: [id number] Date of Birth: 11/12/1940  Today's Date: 03/30/2016 OT Individual Time: [id number]   OT Individual Time=73 minutes   Short Term Goals: Week 1:  OT Short Term Goal 1 (Week 1): STGs equal to LTGs set at supervision to modified independent level.   Skilled Therapeutic Interventions/Progress Updates:   Patient completed skilled OT as follows:    Supine with head of bed elevated to edge of bed transfer with supervision.   Bent over to don and tie shoes without losses of balance.    Then safely ambulated without an assistive device to comb hair with supervision.     As well, patient participated in [facility] home skills activities and only required one prompt for sequencing - she donned pillow sham before pillow case though case was lying on bed.   Of note:  Case was in her right visual field.     She was able to complete challenged balance activities without losses of balance and completed at a slow, safe pace.    As well, she completed bilateral UE and digital activities.- need require a little extra time for bilateral dexterity activities.   Patient was able to ambulate back to room further working on balance bouncing soft medium sized ball without losses of balance-even when bending over to retrieve ball she did not catch  She was left in her room toileting with her husband supervising  Therapy Documentation Precautions:  Precautions Precautions: None Precaution Comments: Impulsive Restrictions Weight Bearing Restrictions: No    Pain: Pain Assessment Pain Assessment: No/denies pain See Function Navigator for Current Functional Status.   Therapy/Group: Individual Therapy  Alfredia Ferguson [facility] 03/30/2016, 4:53 PM

## 2016-03-30 NOTE — Plan of Care (Signed)
Problem: RH Dressing Goal: LTG Patient will perform upper body dressing (OT) LTG Patient will perform upper body dressing with assist, with/without cues (OT).  Downgraded as pt's husband typically assists by fastening bra

## 2016-03-30 NOTE — Plan of Care (Signed)
Problem: RH BOWEL ELIMINATION Goal: RH STG MANAGE BOWEL WITH ASSISTANCE STG Manage Bowel with min Assistance.   Outcome: Not Progressing LBM 03-25-16 Goal: RH STG MANAGE BOWEL W/MEDICATION W/ASSISTANCE STG Manage Bowel with Medication with min Assistance.   Outcome: Not Progressing LBM 03-25-16

## 2016-03-30 NOTE — Progress Notes (Signed)
Subjective/Complaints: Finally had BM, no abd pain  ROS- unable to obtain due to speech issues  Objective: Vital Signs: Blood pressure 108/77, pulse 71, temperature 98.3 F (36.8 C), temperature source Oral, resp. rate 18, height '5\' 1"'  (1.549 m), weight 83.2 kg (183 lb 6.8 oz), SpO2 99 %. Dg Swallowing Func-speech Pathology  Result Date: 03/29/2016 Objective Swallowing Evaluation: Type of Study: MBS-Modified Barium Swallow Study Patient Details Name: Susan Patel MRN: [id number] Date of Birth: 1941-02-03 Today's Date: 03/29/2016 Time: SLP Start Time: 0912-SLP Stop Time : 0940 SLP Time Calculation (min) : 28 min Past Medical History: Past Medical History: Diagnosis Date . Arthritis   'all over" . Asthma  . COPD (chronic obstructive pulmonary disease) (HCC)   O2 per [place] at nights  . Depression  . Diabetes mellitus without complication ([place])   borderline- , states she was on med., but MD told her "everything is under control so I threw the bottle away" . Fibromyalgia  . GERD (gastroesophageal reflux disease)  . Hypertension  . Neuromuscular disorder (HCC)   parkinson, neuropathy- both feet & hands. Past Surgical History: Past Surgical History: Procedure Laterality Date . ABDOMINAL HYSTERECTOMY   . ANTERIOR CERVICAL DECOMP/DISCECTOMY FUSION N/A 07/10/2015  Procedure: Cervical five-six, Cervical six-seven anterior cervical decompression with fusion plating and bonegraft;  Surgeon: Jovita Gamma, MD;  Location: [facility];  Service: Neurosurgery;  Laterality: N/A; . APPENDECTOMY   . BIOPSY EYE MUSCLE  03/20/2016  biopsy vessel to right eye due to swelling . EYE SURGERY Bilateral   cataracts removed, /w "cyrstal lenses"  . SHOULDER ARTHROSCOPY Right   x2   RCR- spurs removed  HPI: 75 y.o.femalewith h/o HTN, DM, HLD, fibromyalgia, GERD, ACDF Feb 2017 admitted with symptoms of stroke. 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  Failed RN stroke swallow.  MRI left internal capsule infarct.   Admitted to CIR on dys 3, nectar thick liquids due to consistent aspiration of thin liquids on MBS.  Repeat MBS ordered today to determine readiness for advancement given improvements noted at bedside.    Assessment / Plan / Recommendation CHL IP CLINICAL IMPRESSIONS 03/29/2016 Therapy Diagnosis Mild pharyngeal phase dysphagia Clinical Impression Pt demonstrated improved airway protection during today's swallow evaluation in comparison to initial study.  Pt's swallow response remains delayed to the level of the pyriforms; however, despite delay pt is able to maintain a clear airway with thin liquids via straws or cup sips.  Pt was noted with x1 instance of trace, sensed aspiration when swallowing a barium tablet with thin liquids due to decreased oral cohesion of mixed consistencies which lead to quicker filling of the pyriforms and spillage of bolus into the airway.  Pt's delayed reflexive cough appeared effective for clearing aspirates from just below the cords.  Recommend liberalizing pt's diet to regular textures and thin liquids with intermittent supervision for use of swallowing precautions: slow rate, small bites and sips, meds whole in puree, and avoid mixed consistencies.  Discussed findings and recommendations with pt's husband immediately following study.  All questions were answered to his satisfaction at this time.  Prognosis for toleration of upgrade good with ST interventions to reinforce use of precautions.   Impact on safety and function Mild aspiration risk     Prognosis 03/29/2016 Prognosis for Safe Diet Advancement Good Barriers to Reach Goals -- Barriers/Prognosis Comment -- CHL IP DIET RECOMMENDATION 03/29/2016 SLP Diet Recommendations Regular solids;Thin liquid Liquid Administration via Cup;Straw Medication Administration Whole meds with puree  Compensations Minimize environmental distractions;Slow rate;Small sips/bites Postural Changes Seated upright at 90 degrees            CHL IP ORAL PHASE  03/29/2016 Oral Phase WFL Oral - Pudding Teaspoon -- Oral - Pudding Cup -- Oral - Honey Teaspoon -- Oral - Honey Cup -- Oral - Nectar Teaspoon -- Oral - Nectar Cup -- Oral - Nectar Straw -- Oral - Thin Teaspoon -- Oral - Thin Cup -- Oral - Thin Straw -- Oral - Puree -- Oral - Mech Soft -- Oral - Regular -- Oral - Multi-Consistency -- Oral - Pill -- Oral Phase - Comment --  CHL IP PHARYNGEAL PHASE 03/29/2016 Pharyngeal Phase Impaired Pharyngeal- Pudding Teaspoon -- Pharyngeal -- Pharyngeal- Pudding Cup -- Pharyngeal -- Pharyngeal- Honey Teaspoon -- Pharyngeal -- Pharyngeal- Honey Cup -- Pharyngeal -- Pharyngeal- Nectar Teaspoon -- Pharyngeal -- Pharyngeal- Nectar Cup Delayed swallow initiation-pyriform sinuses Pharyngeal -- Pharyngeal- Nectar Straw -- Pharyngeal -- Pharyngeal- Thin Teaspoon -- Pharyngeal -- Pharyngeal- Thin Cup Delayed swallow initiation-pyriform sinuses Pharyngeal -- Pharyngeal- Thin Straw Delayed swallow initiation-pyriform sinuses;Reduced airway/laryngeal closure;Penetration/Aspiration during swallow Pharyngeal Material enters airway, remains ABOVE vocal cords then ejected out;Material enters airway, passes BELOW cords then ejected out Pharyngeal- Puree WFL Pharyngeal -- Pharyngeal- Mechanical Soft -- Pharyngeal -- Pharyngeal- Regular WFL Pharyngeal -- Pharyngeal- Multi-consistency -- Pharyngeal -- Pharyngeal- Pill -- Pharyngeal -- Pharyngeal Comment --  CHL IP CERVICAL ESOPHAGEAL PHASE 03/29/2016 Cervical Esophageal Phase WFL Pudding Teaspoon -- Pudding Cup -- Honey Teaspoon -- Honey Cup -- Nectar Teaspoon -- Nectar Cup -- Nectar Straw -- Thin Teaspoon -- Thin Cup -- Thin Straw -- Puree -- Mechanical Soft -- Regular -- Multi-consistency -- Pill -- Cervical Esophageal Comment -- No flowsheet data found. Page, Selinda Orion 03/29/2016, 9:52 AM              Results for orders placed or performed during the hospital encounter of 03/23/16 (from the past 72 hour(s))  Glucose, capillary     Status:  Abnormal   Collection Time: 03/27/16 11:30 AM  Result Value Ref Range   Glucose-Capillary 125 (H) 65 - 99 mg/dL  Glucose, capillary     Status: Abnormal   Collection Time: 03/27/16  5:01 PM  Result Value Ref Range   Glucose-Capillary 113 (H) 65 - 99 mg/dL  Glucose, capillary     Status: Abnormal   Collection Time: 03/27/16  8:40 PM  Result Value Ref Range   Glucose-Capillary 169 (H) 65 - 99 mg/dL  Glucose, capillary     Status: Abnormal   Collection Time: 03/28/16  6:38 AM  Result Value Ref Range   Glucose-Capillary 114 (H) 65 - 99 mg/dL  Glucose, capillary     Status: Abnormal   Collection Time: 03/28/16 11:37 AM  Result Value Ref Range   Glucose-Capillary 119 (H) 65 - 99 mg/dL  Glucose, capillary     Status: Abnormal   Collection Time: 03/28/16  4:52 PM  Result Value Ref Range   Glucose-Capillary 106 (H) 65 - 99 mg/dL  Glucose, capillary     Status: Abnormal   Collection Time: 03/28/16  8:36 PM  Result Value Ref Range   Glucose-Capillary 118 (H) 65 - 99 mg/dL  Glucose, capillary     Status: Abnormal   Collection Time: 03/29/16  6:50 AM  Result Value Ref Range   Glucose-Capillary 134 (H) 65 - 99 mg/dL  Glucose, capillary     Status: Abnormal   Collection Time: 03/29/16 12:20 PM  Result Value Ref  Range   Glucose-Capillary 121 (H) 65 - 99 mg/dL   Comment 1 Notify RN   Glucose, capillary     Status: None   Collection Time: 03/29/16  5:10 PM  Result Value Ref Range   Glucose-Capillary 97 65 - 99 mg/dL   Comment 1 Notify RN   Glucose, capillary     Status: Abnormal   Collection Time: 03/29/16  9:18 PM  Result Value Ref Range   Glucose-Capillary 138 (H) 65 - 99 mg/dL  CBC     Status: Abnormal   Collection Time: 03/30/16  5:01 AM  Result Value Ref Range   WBC 8.0 4.0 - 10.5 K/uL   RBC 4.18 3.87 - 5.11 MIL/uL   Hemoglobin 11.6 (L) 12.0 - 15.0 g/dL   HCT 35.8 (L) 36.0 - 46.0 %   MCV 85.6 78.0 - 100.0 fL   MCH 27.8 26.0 - 34.0 pg   MCHC 32.4 30.0 - 36.0 g/dL   RDW 14.3  11.5 - 15.5 %   Platelets 366 150 - 400 K/uL  Creatinine, serum     Status: None   Collection Time: 03/30/16  5:01 AM  Result Value Ref Range   Creatinine, Ser 0.88 0.44 - 1.00 mg/dL   GFR calc non Af Amer >60 >60 mL/min   GFR calc Af Amer >60 >60 mL/min    Comment: (NOTE) The eGFR has been calculated using the CKD EPI equation. This calculation has not been validated in all clinical situations. eGFR's persistently <60 mL/min signify possible Chronic Kidney Disease.   Glucose, capillary     Status: Abnormal   Collection Time: 03/30/16  6:52 AM  Result Value Ref Range   Glucose-Capillary 120 (H) 65 - 99 mg/dL      General: No acute distress, sleeping Mood and affect are appropriate Heart: Regular rate and rhythm no rubs murmurs or extra sounds Lungs: Clear to auscultation, breathing unlabored, no rales or wheezes Abdomen: Positive bowel sounds, soft nontender to palpation, nondistended Extremities: No clubbing, cyanosis, or edema Skin: No evidence of breakdown, no evidence of rash Neurologic: Cranial nerves II through XII intact, 4/5 in RUE, 5/5 on left, decreased toe tapping speed on RLE Aphasia, expressive Speech dysarthric   Musculoskeletal: Full range of motion in all 4 extremities. No joint swelling   Assessment/Plan: 1. 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 Physiatrist is providing close team supervision and 24 hour management of active medical problems listed below. Physiatrist and rehab team continue to assess barriers to discharge/monitor patient progress toward functional and medical goals. FIM: Function - Bathing Position: Shower Body parts bathed by patient: Right arm, Left arm, Chest, Abdomen, Front perineal area, Buttocks, Right upper leg, Left lower leg, Right lower leg, Left upper leg Body parts bathed by helper: Back Assist Level: Touching or  steadying assistance(Pt > 75%)  Function- Upper Body Dressing/Undressing What is the patient wearing?: Pull over shirt/dress Pull over shirt/dress - Perfomed by patient: Put head through opening, Thread/unthread right sleeve, Thread/unthread left sleeve, Pull shirt over trunk Assist Level: Supervision or verbal cues Function - Lower Body Dressing/Undressing What is the patient wearing?: Non-skid slipper socks, Pants, Underwear Underwear - Performed by patient: Thread/unthread right underwear leg, Thread/unthread left underwear leg, Pull underwear up/down Pants- Performed by patient: Thread/unthread right pants leg, Thread/unthread left pants leg, Pull pants up/down Non-skid slipper socks- Performed by patient: Don/doff right sock, Don/doff left sock Assist  for lower body dressing: Touching or steadying assistance (Pt > 75%)  Function - Toileting Toileting steps completed by patient: Adjust clothing prior to toileting, Performs perineal hygiene, Adjust clothing after toileting Toileting steps completed by helper: Adjust clothing prior to toileting, Adjust clothing after toileting (per Candice Applewhite, NT) Toileting Assistive Devices: Grab bar or rail Assist level: Supervision or verbal cues  Function Midwife transfer assistive device: Grab bar Assist level to toilet: Touching or steadying assistance (Pt > 75%) Assist level from toilet: Touching or steadying assistance (Pt > 75%)  Function - Chair/bed transfer Chair/bed transfer method: Ambulatory Chair/bed transfer assist level: Supervision or verbal cues Chair/bed transfer assistive device: Bedrails Chair/bed transfer details: Verbal cues for precautions/safety  Function - Locomotion: Wheelchair Will patient use wheelchair at discharge?: No Type: Manual Max wheelchair distance: 146f  Assist Level: Moderate assistance (Pt 50 - 74%) Assist Level: Moderate assistance (Pt 50 - 74%) Assist Level: Moderate  assistance (Pt 50 - 74%) Turns around,maneuvers to table,bed, and toilet,negotiates 3% grade,maneuvers on rugs and over doorsills: No Function - Locomotion: Ambulation Assistive device: No device Max distance: 200 Assist level: Supervision or verbal cues Assist level: Supervision or verbal cues Assist level: Supervision or verbal cues Assist level: Supervision or verbal cues Assist level: Supervision or verbal cues  Function - Comprehension Comprehension: Auditory Comprehension assist level: Follows basic conversation/direction with no assist  Function - Expression Expression: Verbal Expression assist level: Expresses basic 75 - 89% of the time/requires cueing 10 - 24% of the time. Needs helper to occlude trach/needs to repeat words.  Function - Social Interaction Social Interaction assist level: Interacts appropriately 75 - 89% of the time - Needs redirection for appropriate language or to initiate interaction.  Function - Problem Solving Problem solving assist level: Solves basic 75 - 89% of the time/requires cueing 10 - 24% of the time  Function - Memory Memory assist level: Recognizes or recalls 50 - 74% of the time/requires cueing 25 - 49% of the time Patient normally able to recall (first 3 days only): Current season, Location of own room, Staff names and faces, That he or she is in a hospital Medical Problem List and Plan: 1.  Functional, mobility, and swallowing deficits secondary to left internal capsule infarct             -CIR PT, OT, SLP,    Team conf in am    2.  DVT Prophylaxis/Anticoagulation: Pharmaceutical: Lovenox 3. Headaches/fibromyalgia with diffuse pain/Pain Management: Used flexeril prn, hydrocodone prn and cymbalta daily at home for diffuse pain. cont tramadol ,  Cont low dose cymbalta.  4. Mood: LCSW to follow for evaluation and support.  5. Neuropsych: This patient is not fully capable of making decisions on her own behalf. 6. Skin/Wound Care: Routine  pressure relief measures.  7. Fluids/Electrolytes/Nutrition: Monitor I/O and offer prosource supplement for low protein calorie malnutrition.  8. Blood pressure:   Has been elevated-- to allow for adequate perfusion at this time. Will monitor and add medication as indicated. Vitals:   03/29/16 1429 03/30/16 0540  BP: (!) 164/58 108/77  Pulse: 65 71  Resp: 17 18  Temp: 97.5 F (36.4 C) 98.3 F (36.8 C)   9.T2DM: Monitor BS ac/hs. Continue to use SSI for now and resume Amaryl as intake improves and BS CBG (last 3)   Recent Labs  03/29/16 1710 03/29/16 2118 03/30/16 0652  GLUCAP 97 138* 120*    10. GERD: On Nexium at home--resume PPI.  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 Lungs clear, sats good 12. Hypokalemia: Likely dilutional. Will recheck 13.  Constipation- sorbitol today,Had BM this am, chronic issue per husband LOS (Days) 7 A FACE TO FACE EVALUATION WAS PERFORMED  KIRSTEINS,ANDREW E 03/30/2016, 7:05 AM

## 2016-03-30 NOTE — Patient Care Conference (Signed)
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 Type 2 diabetes mellitus with peripheral neuropathy (HCC)   . Benign essential HTN   . Fibromyalgia   . Chronic pain syndrome   . Chronic obstructive pulmonary disease ([place])   . Gait disturbance, post-stroke   . Dysarthria, post-stroke   . Dysphagia, post-stroke   . AKI (acute kidney injury) ([place])   . Acute blood loss anemia   . 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 Mixed hyperlipidemia   . Stroke ([place]) 03/20/2016  . CVA (cerebral vascular accident) ([place]) 03/20/2016  . Allergic rhinitis 12/17/2015  . Primary osteoarthritis of both knees 11/19/2015  . Obesity (BMI 30-39.9) 09/04/2015  . Essential hypertension 09/04/2015  . Constipation 09/04/2015  . Asthma 08/26/2015  . Raynaud's phenomenon 08/26/2015  . Gastro-esophageal reflux disease with esophagitis 08/26/2015  . Spinal stenosis 08/26/2015  . Vitamin D deficiency 08/26/2015  . Diabetes mellitus, type 2 ([place]) 08/22/2015  . HNP (herniated nucleus pulposus), cervical 07/10/2015  . Cervical radicular pain 04/30/2013  . Knee osteoarthritis 01/05/2012    Expected Discharge Date: Expected Discharge Date: 03/31/16  Team Members Present: Physician leading conference:  Dr. Alysia Penna Social Worker Present: Ovidio Kin, LCSW Nurse Present: Heather Roberts, RN PT Present: Dwyane Dee, PT OT Present: Simonne Come, OT SLP Present: Windell Moulding, SLP PPS Coordinator present : Daiva Nakayama, RN, CRRN     Current Status/Progress Goal Weekly Team Focus  Medical     ready for DC   medical stability-HTN & DM     Bowel/Bladder   continent of bowel and bladder  Remain continent of bowel and bladder  Assist with toileting needs    Swallow/Nutrition/ Hydration   upgraded to regular, thin liquids   mod I   education complete, husband able to provide supervision during meals to reinforce use of swallowing precautions.    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  Barriers to Discharge:   HTN controlled, diabetes managed-ready for DC today   Possible Resolutions to Barriers:    none   Discharge Planning/Teaching Needs:    Home with hubabnd who has been here and attended therapies with pt. Feel ready to go home today.     Team Discussion:  Pt reached her goals of supervision level and passed MBS so now on regular with thin liquids. Outing yesterday went well and she was supervision level in the community. Medically ready for DC    Revisions to Treatment Plan:  DC today      Elease Hashimoto 03/31/2016, 11:36 AM

## 2016-03-30 NOTE — Progress Notes (Signed)
Recreational Therapy Session Note  Patient Details  Name: RAQUELLE GISMONDI MRN: [id number] Date of Birth: 06-19-1940 Today's Date: 03/30/2016  Pain: no c/o Skilled Therapeutic Interventions/Progress Updates: Pt referred by team for participation in community reintegration and agreeable to participate in an outing today with goals at supervision ambulatory level and min assist for cognition & communication.  Pt participated in community reintegration to [employer] at goal level.  Pt ambulated throughout the entire outing without rest breaks other than during the [place] ride to and from [employer].  Goals focused on safe functional mobility on various community surfaces, identification & negotiation of obstacles, accessing public restroom, right sided attention, wayfinding, communication, identification & use of energy conservation techniques.  See outing goal sheet in shadow chart for full details.  No further TR.  Therapy/Group: [employer]   Dmarcus Decicco 03/30/2016, 8:12 AM

## 2016-03-30 NOTE — Progress Notes (Signed)
Social Work Patient ID: Susan Patel, female   DOB: 1941-01-23, 75 y.o.   MRN: [id number]  Met with pt and husband to discuss team feels she will be ready tomorrow for discharge, as long as MD ok with this. Husband very pleased with her progress and pt excited she passed the swallowing test yesterday. Plan for outing today and see how she does. Discussed OP therapies, both feel Susan Patel is closest. Will make referral and work toward discharge tomorrow.

## 2016-03-30 NOTE — Plan of Care (Signed)
Problem: RH Floor Transfers Goal: LTG Patient will perform floor transfers w/assist (PT) LTG: Patient will perform floor transfers with assistance (PT).  Outcome: Not Met (add Reason) Did not assess on this admission  Problem: RH Other (Specify) Goal: RH LTG Other (Specify)2 Outcome: Not Met (add Reason) Unable to assess 2/2 short ELOS and pt d/c

## 2016-03-30 NOTE — Plan of Care (Signed)
Problem: RH Swallowing Goal: LTG Patient will consume least restrictive PO diet (SLP) LTG:  Patient will consume least restrictive PO diet with assist for use of compensatory strategies  (SLP)  Outcome: Not Met (add Reason) Pt at times needs supervision cues for rate and portion control due to impulsivity    Problem: RH Expression Communication Goal: LTG Patient will verbally express basic/complex needs (SLP) LTG:  Patient will verbally express basic/complex needs, wants or ideas with cues  (SLP)  Outcome: Not Met (add Reason) Min assist  Goal: LTG Patient will increase speech intelligibility (SLP) LTG: Patient will increase speech intelligibility at word/phrase/conversation level with cues, % of the time (SLP)  Outcome: Not Met (add Reason) Min assist needed for intelligibility

## 2016-03-30 NOTE — Progress Notes (Signed)
RT attempted to give Breo inhaler this AM but the patient was at OT.  The RN stated she would give the treatment when the patient returned. At 12:15 RT called patient's RN to verify the patient received the treatment. RN stated yes treatment was given and she would update the patient's chart.

## 2016-03-30 NOTE — Progress Notes (Signed)
Occupational Therapy Discharge Summary  Patient Details  Name: Susan Patel MRN: [id number] Date of Birth: 1940/08/05  Patient has met 12 of 14 long term goals due to improved activity tolerance, improved balance, ability to compensate for deficits and functional use of  RIGHT upper extremity.  Patient to discharge at overall Modified Independent level, supervision to min assist with dressing.  Patient's care partner is independent to provide the necessary  assistance at discharge.    Reasons goals not met: Pt has not completed bathing or dressing with therapy since initial evaluation.  She has been completing dressing with husband, who reports that he typically sets her up and then fastens bra as he typically did that prior to admission.  Recommendation:  Patient will benefit from ongoing skilled OT services in outpatient setting to continue to advance functional skills in the area of BADL, iADL and Reduce care partner burden.  Equipment: No equipment provided  Reasons for discharge: treatment goals met and discharge from hospital  Patient/family agrees with progress made and goals achieved: Yes  OT Discharge Precautions/Restrictions  Precautions Precaution Comments: decreased awareness General   Vital Signs Therapy Vitals Temp: 98 F (36.7 C) Temp Source: Oral Pulse Rate: 75 Resp: 19 BP: (!) 145/53 Patient Position (if appropriate): Lying Oxygen Therapy SpO2: 96 % O2 Device: Not Delivered Pain Pain Assessment Pain Assessment: No/denies pain ADL  See Function Navigator Vision/Perception  Vision- History Baseline Vision/History: Wears glasses Patient Visual Report: No change from baseline Vision- Assessment Vision Assessment?: Yes Eye Alignment: Within Functional Limits Ocular Range of Motion: Within Functional Limits Tracking/Visual Pursuits: Decreased smoothness of horizontal tracking  Cognition Overall Cognitive Status: Impaired/Different from  baseline Arousal/Alertness: Awake/alert Orientation Level: Oriented X4 Attention: Sustained Sustained Attention: Appears intact Memory: Impaired Memory Impairment: Storage deficit;Retrieval deficit Awareness: Impaired Awareness Impairment: Emergent impairment Problem Solving: Impaired Problem Solving Impairment: Functional basic;Verbal basic Executive Function: Self Monitoring;Self Correcting Self Monitoring: Impaired Self Monitoring Impairment: Verbal basic;Functional basic Behaviors: Impulsive Safety/Judgment: Impaired Sensation Sensation Light Touch: Appears Intact Stereognosis: Appears Intact Proprioception: Appears Intact Coordination Gross Motor Movements are Fluid and Coordinated: Yes Fine Motor Movements are Fluid and Coordinated: Yes Coordination and Movement Description: slower movement  Extremity/Trunk Assessment RUE Assessment RUE Assessment: Within Functional Limits LUE Assessment LUE Assessment: Within Functional Limits   See Function Navigator for Current Functional Status.  Simonne Come 03/30/2016, 5:39 PM

## 2016-03-30 NOTE — Progress Notes (Signed)
Physical Therapy Session Note  Patient Details  Name: LACARA DUNSWORTH MRN: [id number] Date of Birth: 07/14/1940  Today's Date: 03/30/2016 PT Individual Time: 1000-1200 PT Individual Time Calculation (min): 120 min    Short Term Goals: Week 1:  PT Short Term Goal 1 (Week 1): STG=LTG due to ELOS   Skilled Therapeutic Interventions/Progress Updates:    Pt participated in community outing to [employer] focus on community level ambulation, transfers, bathroom transfers, path finding, problem solving, attention, awareness, and memory.  See outing goal sheet in shadow chart for details.  Pt returned to room at end of session and left positioned edge of bed with call bell in reach and needs met.    Therapy Documentation Precautions:  Precautions Precautions: Fall Precaution Comments: Impulsive Restrictions Weight Bearing Restrictions: No   See Function Navigator for Current Functional Status.   Therapy/Group: Individual Therapy  Earnest Conroy Penven-Crew 03/30/2016, 12:10 PM

## 2016-03-30 NOTE — Progress Notes (Addendum)
Occupational Therapy Session Note  Patient Details  Name: Susan Patel MRN: [id number] Date of Birth: Jun 18, 1940  Today's Date: 03/30/2016 OT Individual Time: 0805-0900 OT Individual Time Calculation (min): 55 min     Short Term Goals: Week 1:  OT Short Term Goal 1 (Week 1): STGs equal to LTGs set at supervision to modified independent level.   Skilled Therapeutic Interventions/Progress Updates:    Treatment session with focus on dynamic standing balance, functional use of BUE, and sequencing during familiar household tasks.  Pt received in room finishing dressing with pt's husband present.  Discussed goals of OT with focus on increased independence with self-care tasks and pt's husband stating that "she can do it all" but then reports that he typically assists with fastening her bra as well as setting out clothes occasionally.  Pt completed 3 grooming tasks in standing at sink without assist.  Ambulated to ADL apt kitchen without assist.  Engaged in simulated meal prep with focus on locating items and following a direction, with plans to complete actual cooking task during next session.  Completed house keeping and laundry tasks with supervision and increased time when utilizing BUE to fold towels and place in dresser drawers.  During mobility around ADL apt, pt bumped into wall on Rt x2 with decreased awareness.  Also demonstrated difficulty problem solving unlocking apt door.  Pt reports excited about her outing to the store and left with a list of items to buy for cooking task.  Left seated at EOB in room with husband present.  Therapy Documentation Precautions:  Precautions Precautions: Fall Precaution Comments: Impulsive Restrictions Weight Bearing Restrictions: No Pain:  Pt with no c/o pain  See Function Navigator for Current Functional Status.   Therapy/Group: Individual Therapy  Simonne Come 03/30/2016, 9:41 AM

## 2016-03-30 NOTE — Discharge Summary (Signed)
Physician Discharge Summary  Patient ID: Susan Patel MRN: [id number] DOB/AGE: 11-14-1940 75 y.o.  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 [facility]) Active Problems:   Benign essential HTN   Chronic obstructive pulmonary disease (HCC)   Dysarthria, post-stroke   Dysphagia, post-stroke   Discharged Condition: stable   Significant Diagnostic Studies: N/A   Labs:  Basic Metabolic Panel: BMP Latest Ref Rng & Units 03/30/2016 03/30/2016 03/24/2016  Glucose 65 - 99 mg/dL 118(H) - 137(H)  BUN 6 - 20 mg/dL 18 - 17  Creatinine 0.44 - 1.00 mg/dL 0.85 0.88 0.93  BUN/Creat Ratio 12 - 28 - - -  Sodium 135 - 145 mmol/L 141 - 140  Potassium 3.5 - 5.1 mmol/L 4.3 - 3.9  Chloride 101 - 111 mmol/L 106 - 108  CO2 22 - 32 mmol/L 30 - 21(L)  Calcium 8.9 - 10.3 mg/dL 9.2 - 9.5    CBC:  Recent Labs Lab 03/30/16 0501  WBC 8.0  HGB 11.6*  HCT 35.8*  MCV 85.6  PLT 366    CBG:  Recent Labs Lab 03/30/16 0652 03/30/16 1203 03/30/16 1654 03/30/16 2027 03/31/16 0632  GLUCAP 120* 117* 107* 126* 120*    Brief HPI:   Susan Patel a 75 y.o.femalewith history of T2DM with neuropathy, HTN, fibromyalgia, chronic pain, COPD who was admitted on 03/20/16 with confusion, right facial droop with slurred speech and word finding deficits as well as difficulty walking. 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 Neurology consulted and recommended resuming ASA (as has not been compliant)  for thrombotic stroke due to SVD. Patient with resultant, balance deficits, aphasia, dysphagia and poor safety awareness with impulsivity affecting safety with mobility. Therapy initiated and and CIR recommended for follow up therapy.    Hospital Course: MEKAILA CRAIL was admitted to rehab 03/23/2016 for inpatient  therapies to consist of PT, ST and OT at least three hours five days a week. Past admission physiatrist, therapy team and rehab RN have worked together to provide customized collaborative inpatient rehab. Blood pressures were monitored on bid basis with permissive HTN to allow for perfusion. Po intake has been good and BS have been controlled on SSI.  Bowel program was adjusted to help manage constipation and she is continent of bowel and bladder. Paint due to fibromyalagia has been managed with prn use of tylenol or tramadol. She has had improvement in mobility as well as improvement in awareness/ability to correct verbal errors. Supervision is recommended due to aphasia. She will continue to receive follow up outpatient PT and ST at [facility] beginning 11/20.    Rehab course: During patient's stay in rehab weekly team conferences were held to monitor patient's progress, set goals and discuss barriers to discharge. At admission, patient required min assist with mobility and self care tasks. She has had improvement in activity tolerance, balance, postural control, as well as ability to compensate for deficits. She is has had improvement in fine motor control/utilization  of left hand as well as improvement in awareness. She needs supervision to min assist for ADL tasks. She is ambulating at modified independent level.  She requires min assist for functional communication due to dysarthria and expressive > receptive aphasia. She is tolerating regular diet, thin liquids with intermittent supervision to utilize for swallow strategies. Family education was  completed with husband who will provide supervision after discharge.    Disposition: 01-Home or Self Care  Diet: Heart healthy/Carb modified diet.    Special Instructions: 1. Monitor blood sugars bid and follow up with MD next week for input on resuming Amaryl.  2. No driving till cleared by MD.    Discharge Instructions    Ambulatory  referral to Physical Medicine Rehab    Complete by:  As directed    1-2 week follow up        Medication List    STOP taking these medications   cyclobenzaprine 5 MG tablet Commonly known as:  FLEXERIL   glimepiride 2 MG tablet Commonly known as:  AMARYL   HYDROcodone-acetaminophen 5-325 MG tablet Commonly known as:  NORCO/VICODIN   losartan 100 MG tablet Commonly known as:  COZAAR   MYRBETRIQ 50 MG Tb24 tablet Generic drug:  mirabegron ER     TAKE these medications   acetaminophen 500 MG tablet Commonly known as:  TYLENOL Take 1,000 mg by mouth every 8 (eight) hours as needed for mild pain or moderate pain.   albuterol 108 (90 Base) MCG/ACT inhaler Commonly known as:  PROVENTIL HFA;VENTOLIN HFA Inhale 1-2 puffs into the lungs every 6 (six) hours as needed for wheezing or shortness of breath.   aspirin 325 MG EC tablet Take 1 tablet (325 mg total) by mouth daily.   BREO ELLIPTA 200-25 MCG/INH Aepb Generic drug:  fluticasone furoate-vilanterol Inhale 1 puff into the lungs daily.   cetirizine 10 MG tablet Commonly known as:  ZYRTEC Take 10 mg by mouth daily.   DULoxetine 30 MG capsule Commonly known as:  CYMBALTA Take 1 capsule (30 mg total) by mouth daily. Start taking on:  04/01/2016 What changed:  medication strength  how much to take   fluticasone 50 MCG/ACT nasal spray Commonly known as:  FLONASE Place 2 sprays into both nostrils daily. What changed:  when to take this  reasons to take this   latanoprost 0.005 % ophthalmic solution Commonly known as:  XALATAN Place 1 drop into both eyes at bedtime. What changed:  when to take this   montelukast 10 MG tablet Commonly known as:  SINGULAIR Take 10 mg by mouth at bedtime.   NEXIUM PO Take 1 capsule by mouth daily.   OXYGEN Inhale 3-4 L into the lungs at bedtime.   polyethylene glycol powder powder Commonly known as:  GLYCOLAX/MIRALAX TAKE 1 CAPFUL IN 8 OUNCES OF WATER JUICE OR TEA AND  DRINK DAILY What changed:  See the new instructions.   rosuvastatin 40 MG tablet Commonly known as:  CRESTOR Take 1 tablet (40 mg total) by mouth daily.   traMADol 50 MG tablet Commonly known as:  ULTRAM Take 0.5-1 tablets (25-50 mg total) by mouth every 6 (six) hours as needed for moderate pain or severe pain.   traZODone 50 MG tablet Commonly known as:  DESYREL TAKE 1 TABLET (50 MG TOTAL) BY MOUTH NIGHTLY AS NEEDED FOR UP TO 30 DAYS FOR SLEEP. What changed:  See the new instructions.   Vitamin D (Ergocalciferol) 50000 units Caps capsule Commonly known as:  DRISDOL Take 50,000 Units by mouth every Monday.      Follow-up Information    Evelina Dun, FNP Follow up on 04/07/2016.   Specialty:  Family Medicine Why:  Appointment @ 2:25 PM Contact information: [place] [address] [phone]        Charlett Blake, MD Follow up.  Specialty:  Physical Medicine and Rehabilitation Contact information: [place] [address] [phone]        Dennie Bible, NP. Call.   Specialty:  Family Medicine Why:  for neurological follow up/Works with Dr. Sherilyn Cooter information: [address] [place] [address] [phone]           Signed: Bary Leriche 03/31/2016, 5:46 PM

## 2016-03-31 LAB — GLUCOSE, CAPILLARY: GLUCOSE-CAPILLARY: 120 mg/dL — AB (ref 65–99)

## 2016-03-31 MED ORDER — LATANOPROST 0.005 % OP SOLN: 1.0000 [drp] | Freq: Every day | OPHTHALMIC | 12 refills | Status: DC

## 2016-03-31 MED ORDER — TRAMADOL HCL 50 MG PO TABS: 25.0000 mg | ORAL_TABLET | Freq: Four times a day (QID) | ORAL | 0 refills | Status: DC | PRN

## 2016-03-31 MED ORDER — ROSUVASTATIN CALCIUM 40 MG PO TABS: 40.0000 mg | ORAL_TABLET | Freq: Every day | ORAL | 0 refills | Status: DC

## 2016-03-31 MED ORDER — DULOXETINE HCL 30 MG PO CPEP: 30.0000 mg | ORAL_CAPSULE | Freq: Every day | ORAL | 0 refills | Status: DC

## 2016-03-31 NOTE — Progress Notes (Signed)
Social Work Elease Hashimoto, LCSW Social Worker Signed   Patient Care Conference Date of Service: 03/30/2016  2:48 PM      Hide copied text Hover for attribution information Inpatient RehabilitationTeam Conference and Plan of Care Update Date: 03/31/2016   Time: 11:00AM      Patient Name: Susan Patel      Medical Record Number: [id number]  Date of Birth: [birth year] Sex: Female         Room/Bed: 4M08C/4M08C-01 Payor Info: Payor: MEDICARE / Plan: MEDICARE PART A AND B / Product Type: *No Product type* /     Admitting Diagnosis: CVA  Admit Date/Time:  03/23/2016  3:55 PM Admission Comments: No comment available    Primary Diagnosis:  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 Type 2 diabetes mellitus with peripheral neuropathy (HCC)    . Benign essential HTN    . Fibromyalgia    . Chronic pain syndrome    . Chronic obstructive pulmonary disease ([place])    . Gait disturbance, post-stroke    . Dysarthria, post-stroke    . Dysphagia, post-stroke    . AKI (acute kidney injury) ([place])    . Acute blood loss anemia    . 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 Mixed hyperlipidemia    . Stroke ([place]) 03/20/2016  . CVA (cerebral vascular accident) ([place]) 03/20/2016  . Allergic rhinitis 12/17/2015  . Primary osteoarthritis of both knees 11/19/2015  . Obesity (BMI 30-39.9) 09/04/2015  . Essential hypertension 09/04/2015  . Constipation 09/04/2015  . Asthma 08/26/2015  . Raynaud's phenomenon 08/26/2015  . Gastro-esophageal reflux disease with esophagitis 08/26/2015  . Spinal stenosis 08/26/2015  . Vitamin D deficiency 08/26/2015  . Diabetes mellitus, type 2 ([place]) 08/22/2015  . HNP (herniated nucleus pulposus), cervical  07/10/2015  . Cervical radicular pain 04/30/2013  . Knee osteoarthritis 01/05/2012      Expected Discharge Date: Expected Discharge Date: 03/31/16   Team Members Present: Physician leading conference: Dr. Alysia Penna Social Worker Present: Ovidio Kin, LCSW Nurse Present: Heather Roberts, RN PT Present: Dwyane Dee, PT OT Present: Simonne Come, OT SLP Present: Windell Moulding, SLP PPS Coordinator present : Daiva Nakayama, RN, CRRN       Current Status/Progress Goal Weekly Team Focus  Medical     ready for DC   medical stability-HTN & DM     Bowel/Bladder   continent of bowel and bladder  Remain continent of bowel and bladder  Assist with toileting needs    Swallow/Nutrition/ Hydration   upgraded to regular, thin liquids   mod I   education complete, husband able to provide supervision during meals to reinforce use of swallowing precautions.    ADL's   Supervision - Mod I  Supervision  d/c planning, family education   Mobility   mod I in controlled environment, supervision in community environment  supervision/mod I ambulatory  community outing, d/c planning, d/c Wednesday    Communication   moderate expressive>receptive aphasia; pt and family education is complete at this time  supervision   pt is ready for discharge    Safety/Cognition/ Behavioral Observations moderate impairment  supervision   pt is ready for discharge, education is  complete    Pain   No complaints of pain  Remain pain free  Assess and treat while in rehab   Skin   No skin problem  No new breakdown while in rehab  Continue monitoring the skin Q shift and PRN       *See Care Plan and progress notes for long and short-term goals.   Barriers to Discharge:   HTN controlled, diabetes managed-ready for DC today  Possible Resolutions to Barriers:    none  Discharge Planning/Teaching Needs:    Home with hubabnd who has been here and attended therapies with pt. Feel ready to go home today.     Team Discussion:    Pt reached her goals of supervision level and passed MBS so now on regular with thin liquids. Outing yesterday went well and she was supervision level in the community. Medically ready for DC   Revisions to Treatment Plan:  DC today        Elease Hashimoto 03/31/2016, 11:36 AM       Patient ID: Susan Patel, female   DOB: Dec 27, 1940, 75 y.o.   MRN: [id number]

## 2016-03-31 NOTE — Discharge Instructions (Signed)
Inpatient Rehab Discharge Instructions  Susan Patel Discharge date and time:  03/31/16  Activities/Precautions/ Functional Status: Activity: no lifting, driving, or strenuous exercise for till cleared by MD Diet: cardiac diet Wound Care: keep wound clean and dry   Functional status:  ___ No restrictions     ___ Walk up steps independently _X__ 24/7 supervision/assistance   ___ Walk up steps with assistance ___ Intermittent supervision/assistance  ___ Bathe/dress independently ___ Walk with walker     _X__ Bathe/dress with supervision ___ Walk Independently    ___ Shower independently ___ Walk with assistance    ___ Shower with assistance _X__ No alcohol     ___ Return to work/school ________  Special Instructions:    COMMUNITY REFERRALS UPON DISCHARGE:    Outpatient: PT & SP  [place]   [id number]  Date of Last Service:03/31/2016  Appointment Date/Time:NOVEMBER 20 Monday @ 1:45-2:45 PM  Medical Equipment/Items Ordered:HAS FROM PREVIOUS ADMITS  Agency/Supplier:   GENERAL COMMUNITY RESOURCES FOR PATIENT/FAMILY: Support Groups:[facility] SUPPORT GROUP EVERY SECOND Thursday @ 3:00-4:00 PM ON THE REHAB UNIT QUESTIONS CONTACT KATIE [id number]   STROKE/TIA DISCHARGE INSTRUCTIONS SMOKING Cigarette smoking nearly doubles your risk of having a stroke & is the single most alterable risk factor  If you smoke or have smoked in the last 12 months, you are advised to quit smoking for your health.  Most of the excess cardiovascular risk related to smoking disappears within a year of stopping.  Ask you doctor about anti-smoking medications  [place] Quit Line: 1-800-QUIT NOW  Free Smoking Cessation Classes [phone]  CHOLESTEROL Know your levels; limit fat & cholesterol in your diet  Lipid Panel     Component Value Date/Time   CHOL 263 (H) 03/21/2016 0517   CHOL 290 (H) 10/23/2015 1725   TRIG 138 03/21/2016 0517   HDL 46 03/21/2016 0517   HDL 45  10/23/2015 1725   CHOLHDL 5.7 03/21/2016 0517   VLDL 28 03/21/2016 0517   LDLCALC 189 (H) 03/21/2016 0517   LDLCALC 208 (H) 10/23/2015 1725      Many patients benefit from treatment even if their cholesterol is at goal.  Goal: Total Cholesterol (CHOL) less than 160  Goal:  Triglycerides (TRIG) less than 150  Goal:  HDL greater than 40  Goal:  LDL (LDLCALC) less than 100   BLOOD PRESSURE American Stroke Association blood pressure target is less that 120/80 mm/Hg  Your discharge blood pressure is:  BP: (!) 149/68  Monitor your blood pressure  Limit your salt and alcohol intake  Many individuals will require more than one medication for high blood pressure  DIABETES (A1c is a blood sugar average for last 3 months) Goal HGBA1c is under 7% (HBGA1c is blood sugar average for last 3 months)  Diabetes:     Lab Results  Component Value Date   HGBA1C 6.6 (H) 03/21/2016     Your HGBA1c can be lowered with medications, healthy diet, and exercise.  Check your blood sugar as directed by your physician  Call your physician if you experience unexplained or low blood sugars.  PHYSICAL ACTIVITY/REHABILITATION Goal is 30 minutes at least 4 days per week  Activity: No driving, Therapies:  See above Return to work: N/A  Activity decreases your risk of heart attack and stroke and makes your heart stronger.  It helps control your weight and blood pressure; helps you relax and can improve your mood.  Participate in a regular exercise program.  Talk with your  doctor about the best form of exercise for you (dancing, walking, swimming, cycling).  DIET/WEIGHT Goal is to maintain a healthy weight  Your discharge diet is: Diet heart healthy/carb modified Room service appropriate? Yes; Fluid consistency: Thin liquids Your height is:  Height: 5\' 1"  (154.9 cm) Your current weight is: Weight: 84.2 kg (185 lb 9.6 oz) Your Body Mass Index (BMI) is:  BMI (Calculated): 34.8  Following the type of diet  specifically designed for you will help prevent another stroke.  Your goal weight  is: 132 lbs  Your goal Body Mass Index (BMI) is 19-24.  Healthy food habits can help reduce 3 risk factors for stroke:  High cholesterol, hypertension, and excess weight.  RESOURCES Stroke/Support Group:  Call [phone]   STROKE EDUCATION PROVIDED/REVIEWED AND GIVEN TO PATIENT Stroke warning signs and symptoms How to activate emergency medical system (call 911). Medications prescribed at discharge. Need for follow-up after discharge. Personal risk factors for stroke. Pneumonia vaccine given:  Flu vaccine given:  My questions have been answered, the writing is legible, and I understand these instructions.  I will adhere to these goals & educational materials that have been provided to me after my discharge from the hospital.     My questions have been answered and I understand these instructions. I will adhere to these goals and the provided educational materials after my discharge from the hospital.  Patient/Caregiver Signature _______________________________ Date __________  Clinician Signature _______________________________________ Date __________  Please bring this form and your medication list with you to all your follow-up doctor's appointments.

## 2016-03-31 NOTE — Progress Notes (Signed)
Pt prepared for discharge. Pt given all discharge instructions and patient discharged to home with husband and all belongings

## 2016-03-31 NOTE — Progress Notes (Signed)
Subjective/Complaints: Remains aphasic, had BM x 3 yesterday  ROS- unable to obtain due to speech issues  Objective: Vital Signs: Blood pressure (!) 149/68, pulse 67, temperature 97.6 F (36.4 C), temperature source Oral, resp. rate 19, height '5\' 1"'  (1.549 m), weight 84.2 kg (185 lb 9.6 oz), SpO2 97 %. Dg Swallowing Func-speech Pathology  Result Date: 03/29/2016 Objective Swallowing Evaluation: Type of Study: MBS-Modified Barium Swallow Study Patient Details Name: Susan Patel MRN: [id number] Date of Birth: [birth year] Today's Date: 03/29/2016 Time: SLP Start Time: 0912-SLP Stop Time : 0940 SLP Time Calculation (min) : 28 min Past Medical History: Past Medical History: Diagnosis Date . Arthritis   'all over" . Asthma  . COPD (chronic obstructive pulmonary disease) (HCC)   O2 per [place] at nights  . Depression  . Diabetes mellitus without complication ([place])   borderline- , states she was on med., but MD told her "everything is under control so I threw the bottle away" . Fibromyalgia  . GERD (gastroesophageal reflux disease)  . Hypertension  . Neuromuscular disorder (HCC)   parkinson, neuropathy- both feet & hands. Past Surgical History: Past Surgical History: Procedure Laterality Date . ABDOMINAL HYSTERECTOMY   . ANTERIOR CERVICAL DECOMP/DISCECTOMY FUSION N/A 07/10/2015  Procedure: Cervical five-six, Cervical six-seven anterior cervical decompression with fusion plating and bonegraft;  Surgeon: Jovita Gamma, MD;  Location: [facility];  Service: Neurosurgery;  Laterality: N/A; . APPENDECTOMY   . BIOPSY EYE MUSCLE  03/20/2016  biopsy vessel to right eye due to swelling . EYE SURGERY Bilateral   cataracts removed, /w "cyrstal lenses"  . SHOULDER ARTHROSCOPY Right   x2   RCR- spurs removed  HPI: 75 y.o.femalewith h/o HTN, DM, HLD, fibromyalgia, GERD, ACDF Feb 2017 admitted with symptoms of stroke. 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  Failed RN stroke swallow.  MRI left internal  capsule infarct.  Admitted to CIR on dys 3, nectar thick liquids due to consistent aspiration of thin liquids on MBS.  Repeat MBS ordered today to determine readiness for advancement given improvements noted at bedside.    Assessment / Plan / Recommendation CHL IP CLINICAL IMPRESSIONS 03/29/2016 Therapy Diagnosis Mild pharyngeal phase dysphagia Clinical Impression Pt demonstrated improved airway protection during today's swallow evaluation in comparison to initial study.  Pt's swallow response remains delayed to the level of the pyriforms; however, despite delay pt is able to maintain a clear airway with thin liquids via straws or cup sips.  Pt was noted with x1 instance of trace, sensed aspiration when swallowing a barium tablet with thin liquids due to decreased oral cohesion of mixed consistencies which lead to quicker filling of the pyriforms and spillage of bolus into the airway.  Pt's delayed reflexive cough appeared effective for clearing aspirates from just below the cords.  Recommend liberalizing pt's diet to regular textures and thin liquids with intermittent supervision for use of swallowing precautions: slow rate, small bites and sips, meds whole in puree, and avoid mixed consistencies.  Discussed findings and recommendations with pt's husband immediately following study.  All questions were answered to his satisfaction at this time.  Prognosis for toleration of upgrade good with ST interventions to reinforce use of precautions.   Impact on safety and function Mild aspiration risk     Prognosis 03/29/2016 Prognosis for Safe Diet Advancement Good Barriers to Reach Goals -- Barriers/Prognosis Comment -- CHL IP DIET RECOMMENDATION 03/29/2016 SLP Diet Recommendations Regular solids;Thin liquid Liquid Administration via Cup;Straw Medication Administration Whole meds  with puree Compensations Minimize environmental distractions;Slow rate;Small sips/bites Postural Changes Seated upright at 90 degrees             CHL IP ORAL PHASE 03/29/2016 Oral Phase WFL Oral - Pudding Teaspoon -- Oral - Pudding Cup -- Oral - Honey Teaspoon -- Oral - Honey Cup -- Oral - Nectar Teaspoon -- Oral - Nectar Cup -- Oral - Nectar Straw -- Oral - Thin Teaspoon -- Oral - Thin Cup -- Oral - Thin Straw -- Oral - Puree -- Oral - Mech Soft -- Oral - Regular -- Oral - Multi-Consistency -- Oral - Pill -- Oral Phase - Comment --  CHL IP PHARYNGEAL PHASE 03/29/2016 Pharyngeal Phase Impaired Pharyngeal- Pudding Teaspoon -- Pharyngeal -- Pharyngeal- Pudding Cup -- Pharyngeal -- Pharyngeal- Honey Teaspoon -- Pharyngeal -- Pharyngeal- Honey Cup -- Pharyngeal -- Pharyngeal- Nectar Teaspoon -- Pharyngeal -- Pharyngeal- Nectar Cup Delayed swallow initiation-pyriform sinuses Pharyngeal -- Pharyngeal- Nectar Straw -- Pharyngeal -- Pharyngeal- Thin Teaspoon -- Pharyngeal -- Pharyngeal- Thin Cup Delayed swallow initiation-pyriform sinuses Pharyngeal -- Pharyngeal- Thin Straw Delayed swallow initiation-pyriform sinuses;Reduced airway/laryngeal closure;Penetration/Aspiration during swallow Pharyngeal Material enters airway, remains ABOVE vocal cords then ejected out;Material enters airway, passes BELOW cords then ejected out Pharyngeal- Puree WFL Pharyngeal -- Pharyngeal- Mechanical Soft -- Pharyngeal -- Pharyngeal- Regular WFL Pharyngeal -- Pharyngeal- Multi-consistency -- Pharyngeal -- Pharyngeal- Pill -- Pharyngeal -- Pharyngeal Comment --  CHL IP CERVICAL ESOPHAGEAL PHASE 03/29/2016 Cervical Esophageal Phase WFL Pudding Teaspoon -- Pudding Cup -- Honey Teaspoon -- Honey Cup -- Nectar Teaspoon -- Nectar Cup -- Nectar Straw -- Thin Teaspoon -- Thin Cup -- Thin Straw -- Puree -- Mechanical Soft -- Regular -- Multi-consistency -- Pill -- Cervical Esophageal Comment -- No flowsheet data found. Page, Selinda Orion 03/29/2016, 9:52 AM              Results for orders placed or performed during the hospital encounter of 03/23/16 (from the past 72 hour(s))  Glucose, capillary      Status: Abnormal   Collection Time: 03/28/16 11:37 AM  Result Value Ref Range   Glucose-Capillary 119 (H) 65 - 99 mg/dL  Glucose, capillary     Status: Abnormal   Collection Time: 03/28/16  4:52 PM  Result Value Ref Range   Glucose-Capillary 106 (H) 65 - 99 mg/dL  Glucose, capillary     Status: Abnormal   Collection Time: 03/28/16  8:36 PM  Result Value Ref Range   Glucose-Capillary 118 (H) 65 - 99 mg/dL  Glucose, capillary     Status: Abnormal   Collection Time: 03/29/16  6:50 AM  Result Value Ref Range   Glucose-Capillary 134 (H) 65 - 99 mg/dL  Glucose, capillary     Status: Abnormal   Collection Time: 03/29/16 12:20 PM  Result Value Ref Range   Glucose-Capillary 121 (H) 65 - 99 mg/dL   Comment 1 Notify RN   Glucose, capillary     Status: None   Collection Time: 03/29/16  5:10 PM  Result Value Ref Range   Glucose-Capillary 97 65 - 99 mg/dL   Comment 1 Notify RN   Glucose, capillary     Status: Abnormal   Collection Time: 03/29/16  9:18 PM  Result Value Ref Range   Glucose-Capillary 138 (H) 65 - 99 mg/dL  CBC     Status: Abnormal   Collection Time: 03/30/16  5:01 AM  Result Value Ref Range   WBC 8.0 4.0 - 10.5 K/uL   RBC 4.18 3.87 - 5.11  MIL/uL   Hemoglobin 11.6 (L) 12.0 - 15.0 g/dL   HCT 35.8 (L) 36.0 - 46.0 %   MCV 85.6 78.0 - 100.0 fL   MCH 27.8 26.0 - 34.0 pg   MCHC 32.4 30.0 - 36.0 g/dL   RDW 14.3 11.5 - 15.5 %   Platelets 366 150 - 400 K/uL  Creatinine, serum     Status: None   Collection Time: 03/30/16  5:01 AM  Result Value Ref Range   Creatinine, Ser 0.88 0.44 - 1.00 mg/dL   GFR calc non Af Amer >60 >60 mL/min   GFR calc Af Amer >60 >60 mL/min    Comment: (NOTE) The eGFR has been calculated using the CKD EPI equation. This calculation has not been validated in all clinical situations. eGFR's persistently <60 mL/min signify possible Chronic Kidney Disease.   Basic metabolic panel     Status: Abnormal   Collection Time: 03/30/16  5:01 AM  Result  Value Ref Range   Sodium 141 135 - 145 mmol/L   Potassium 4.3 3.5 - 5.1 mmol/L   Chloride 106 101 - 111 mmol/L   CO2 30 22 - 32 mmol/L   Glucose, Bld 118 (H) 65 - 99 mg/dL   BUN 18 6 - 20 mg/dL   Creatinine, Ser 0.85 0.44 - 1.00 mg/dL   Calcium 9.2 8.9 - 10.3 mg/dL   GFR calc non Af Amer >60 >60 mL/min   GFR calc Af Amer >60 >60 mL/min    Comment: (NOTE) The eGFR has been calculated using the CKD EPI equation. This calculation has not been validated in all clinical situations. eGFR's persistently <60 mL/min signify possible Chronic Kidney Disease.    Anion gap 5 5 - 15  Glucose, capillary     Status: Abnormal   Collection Time: 03/30/16  6:52 AM  Result Value Ref Range   Glucose-Capillary 120 (H) 65 - 99 mg/dL  Glucose, capillary     Status: Abnormal   Collection Time: 03/30/16 12:03 PM  Result Value Ref Range   Glucose-Capillary 117 (H) 65 - 99 mg/dL   Comment 1 Notify RN   Glucose, capillary     Status: Abnormal   Collection Time: 03/30/16  4:54 PM  Result Value Ref Range   Glucose-Capillary 107 (H) 65 - 99 mg/dL   Comment 1 Notify RN   Glucose, capillary     Status: Abnormal   Collection Time: 03/30/16  8:27 PM  Result Value Ref Range   Glucose-Capillary 126 (H) 65 - 99 mg/dL   Comment 1 Notify RN   Glucose, capillary     Status: Abnormal   Collection Time: 03/31/16  6:32 AM  Result Value Ref Range   Glucose-Capillary 120 (H) 65 - 99 mg/dL   Comment 1 Notify RN       General: No acute distress, sleeping Mood and affect are appropriate Heart: Regular rate and rhythm no rubs murmurs or extra sounds Lungs: Clear to auscultation, breathing unlabored, no rales or wheezes Abdomen: Positive bowel sounds, soft nontender to palpation, nondistended Extremities: No clubbing, cyanosis, or edema Skin: No evidence of breakdown, no evidence of rash Neurologic: Cranial nerves II through XII intact, 4/5 in RUE, 5/5 on left, decreased toe tapping speed on RLE Aphasia,  expressive Speech dysarthric   Musculoskeletal: Full range of motion in all 4 extremities. No joint swelling   Assessment/Plan: 1. 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  Stable for D/C today F/u PCP in 3-4 weeks F/u PM&R  2 weeks See D/C summary See D/C instructionsFIM: Function - Bathing Position: Shower Body parts bathed by patient: Right arm, Left arm, Chest, Abdomen, Front perineal area, Buttocks, Right upper leg, Left lower leg, Right lower leg, Left upper leg Body parts bathed by helper: Back Assist Level: Touching or steadying assistance(Pt > 75%)  Function- Upper Body Dressing/Undressing What is the patient wearing?: Pull over shirt/dress, Bra Bra - Perfomed by patient: Thread/unthread right bra strap, Thread/unthread left bra strap Bra - Perfomed by helper: Hook/unhook bra (pull down sports bra) Pull over shirt/dress - Perfomed by patient: Put head through opening, Thread/unthread right sleeve, Thread/unthread left sleeve, Pull shirt over trunk (per pt and husband report) Assist Level: Touching or steadying assistance(Pt > 75%) (husband typically assists with fastening bra) Function - Lower Body Dressing/Undressing What is the patient wearing?: Underwear, Pants, Socks, Shoes Underwear - Performed by patient: Thread/unthread right underwear leg, Thread/unthread left underwear leg, Pull underwear up/down Pants- Performed by patient: Thread/unthread right pants leg, Thread/unthread left pants leg, Pull pants up/down Non-skid slipper socks- Performed by patient: Don/doff right sock, Don/doff left sock Socks - Performed by patient: Don/doff right sock, Don/doff left sock Shoes - Performed by patient: Don/doff right shoe, Don/doff left shoe, Fasten right, Fasten left Assist for lower body dressing: Set up (per pt and husband report)  Function - Toileting Toileting steps completed by patient: Adjust clothing prior to toileting, Performs perineal hygiene, Adjust clothing  after toileting Toileting steps completed by helper: Adjust clothing prior to toileting, Adjust clothing after toileting (per Candice Applewhite, NT) Toileting Assistive Devices: Grab bar or rail Assist level: Set up/obtain supplies  Function - Air cabin crew transfer assistive device: Grab bar Assist level to toilet: Set up only Assist level from toilet: Set up only  Function - Chair/bed transfer Chair/bed transfer method: Ambulatory Chair/bed transfer assist level: No Help, no cues, assistive device, takes more than a reasonable amount of time Chair/bed transfer assistive device: Bedrails Chair/bed transfer details: Verbal cues for precautions/safety  Function - Locomotion: Wheelchair Will patient use wheelchair at discharge?: No Type: Manual Max wheelchair distance: 117f  Assist Level: Moderate assistance (Pt 50 - 74%) Assist Level: Moderate assistance (Pt 50 - 74%) Assist Level: Moderate assistance (Pt 50 - 74%) Turns around,maneuvers to table,bed, and toilet,negotiates 3% grade,maneuvers on rugs and over doorsills: No Function - Locomotion: Ambulation Assistive device: No device Max distance: >1000' Assist level: No help, No cues, assistive device, takes more than a reasonable amount of time (in controlled environment, mod I, supervision in community environment) Assist level: No help, No cues, assistive device, takes more than a reasonable amount of time Assist level: No help, No cues, assistive device, takes more than a reasonable amount of time Assist level: No help, No cues, assistive device, takes more than a reasonable amount of time Assist level: Supervision or verbal cues  Function - Comprehension Comprehension: Auditory Comprehension assist level: Follows complex conversation/direction with extra time/assistive device  Function - Expression Expression: Verbal Expression assist level: Expresses basic 75 - 89% of the time/requires cueing 10 - 24% of the  time. Needs helper to occlude trach/needs to repeat words.  Function - Social Interaction Social Interaction assist level: Interacts appropriately 75 - 89% of the time - Needs redirection for appropriate language or to initiate interaction.  Function - Problem Solving Problem solving assist level: Solves basic 75 - 89% of the time/requires cueing 10 - 24% of the time  Function - Memory Memory assist level: Recognizes or recalls  50 - 74% of the time/requires cueing 25 - 49% of the time Patient normally able to recall (first 3 days only): Current season, Location of own room, Staff names and faces, That he or she is in a hospital Medical Problem List and Plan: 1.  Functional, mobility, and swallowing deficits secondary to left internal capsule infarct                 Team conference today please see physician documentation under team conference tab, met with team face-to-face to discuss problems,progress, and goals. Formulized individual treatment plan based on medical history, underlying problem and comorbidities.   2.  DVT Prophylaxis/Anticoagulation: Pharmaceutical: Lovenox 3. Headaches/fibromyalgia with diffuse pain/Pain Management: Used flexeril prn, hydrocodone prn and cymbalta daily at home for diffuse pain. cont tramadol ,  Cont low dose cymbalta.  4. Mood: LCSW to follow for evaluation and support.  5. Neuropsych: This patient is not fully capable of making decisions on her own behalf. 6. Skin/Wound Care: Routine pressure relief measures.  7. Fluids/Electrolytes/Nutrition: Monitor I/O and offer prosource supplement for low protein calorie malnutrition.  8. Blood pressure:   Has been elevated-- to allow for adequate perfusion at this time. Will monitor and add medication as indicated. Vitals:   03/30/16 1541 03/31/16 0455  BP: (!) 145/53 (!) 149/68  Pulse: 75 67  Resp: 19   Temp: 98 F (36.7 C) 97.6 F (36.4 C)   9.T2DM: Monitor BS ac/hs. Continue to use SSI for now and resume  Amaryl as intake improves and BS CBG (last 3)   Recent Labs  03/30/16 1654 03/30/16 2027 03/31/16 0632  GLUCAP 107* 126* 120*    10. GERD: On Nexium at home--resume PPI. 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 12. Hypokalemia: Likely dilutional. Will recheck 13.  Constipation- sorbitol today,Had BM this am, chronic issue per husband LOS (Days) 8 A FACE TO FACE EVALUATION WAS PERFORMED  Susan Patel 03/31/2016, 7:27 AM

## 2016-04-01 ENCOUNTER — Telehealth: Payer: Self-pay | Admitting: *Deleted

## 2016-04-01 NOTE — Telephone Encounter (Signed)
Transitional call contact made with husband and Torianne 10 :30 04/01/16   1. Are you/is patient experiencing any problems since coming home? Are there any questions regarding any aspect of care? No  2. Are there any questions regarding medications administration/dosing? Are meds being taken as prescribed? Patient should review meds with caller to confirm Yes has all of medication 3. Have there been any falls? No 4. Has Home Health been to the house and/or have they contacted you? If not, have you tried to contact them? Can we help you contact them? They are not receiving home health  5. Are bowels and bladder emptying properly? Are there any unexpected incontinence issues? If applicable, is patient following bowel/bladder programs? No problems 6. Any fevers, problems with breathing, unexpected pain? No 7. Are there any skin problems or new areas of breakdown? 8. Has the patient/family member arranged specialty MD follow up (ie cardiology/neurology/renal/surgical/etc)?  Can we help arrange? Appt given today with Dr Letta Pate 9. Does the patient need any other services or support that we can help arrange? No, outpt rehab set up 10. Are caregivers following through as expected in assisting the patient? yes 11. Has the patient quit smoking, drinking alcohol, or using drugs as recommended? N/A  Kathia could not make the TC visit scheduled so changed to hospital follow up 04/23/16@ 12:30 arriving at [place]

## 2016-04-05 ENCOUNTER — Ambulatory Visit (HOSPITAL_COMMUNITY): Payer: Medicare Other | Admitting: Speech Pathology

## 2016-04-05 ENCOUNTER — Ambulatory Visit (HOSPITAL_COMMUNITY): Payer: Medicare Other | Attending: Family

## 2016-04-05 ENCOUNTER — Encounter (HOSPITAL_COMMUNITY): Payer: Self-pay

## 2016-04-05 DIAGNOSIS — R41841 Cognitive communication deficit: Secondary | ICD-10-CM

## 2016-04-05 DIAGNOSIS — R1312 Dysphagia, oropharyngeal phase: Secondary | ICD-10-CM | POA: Diagnosis not present

## 2016-04-05 DIAGNOSIS — I63412 Cerebral infarction due to embolism of left middle cerebral artery: Secondary | ICD-10-CM | POA: Insufficient documentation

## 2016-04-05 DIAGNOSIS — I69822 Dysarthria following other cerebrovascular disease: Secondary | ICD-10-CM

## 2016-04-06 ENCOUNTER — Ambulatory Visit: Payer: Medicare Other | Admitting: Family

## 2016-04-06 ENCOUNTER — Encounter (HOSPITAL_COMMUNITY): Payer: Self-pay

## 2016-04-06 NOTE — Therapy (Addendum)
[place], [place], [address] Phone: [phone]   Fax:  [phone]  Speech Language Pathology Evaluation  Patient Details  Name: Susan Patel MRN: [id number] Date of Birth: Dec 09, 1940 Referring Provider: Dr. Alysia Penna   Encounter Date: 04/05/2016      End of Session - 04/05/16 1622    Visit Number 1   Number of Visits 17   Date for SLP Re-Evaluation 06/02/15   Authorization Type medicare part A - primary; BCBS state health -secondary    SLP Start Time 1520   SLP Stop Time  1618   SLP Time Calculation (min) 58 min   Activity Tolerance Patient tolerated treatment well      Past Medical History:  Diagnosis Date  . Arthritis    'all over"  . Asthma   . COPD (chronic obstructive pulmonary disease) (HCC)    O2 per [place] at nights   . Depression   . Diabetes mellitus without complication ([place])    borderline- , states she was on med., but MD told her "everything is under control so I threw the bottle away"  . Fibromyalgia   . GERD (gastroesophageal reflux disease)   . Hypertension   . Neuromuscular disorder (HCC)    parkinson, neuropathy- both feet & hands.    Past Surgical History:  Procedure Laterality Date  . ABDOMINAL HYSTERECTOMY    . ANTERIOR CERVICAL DECOMP/DISCECTOMY FUSION N/A 07/10/2015   Procedure: Cervical five-six, Cervical six-seven anterior cervical decompression with fusion plating and bonegraft;  Surgeon: Jovita Gamma, MD;  Location: [facility];  Service: Neurosurgery;  Laterality: N/A;  . APPENDECTOMY    . BIOPSY EYE MUSCLE  03/20/2016   biopsy vessel to right eye due to swelling  . EYE SURGERY Bilateral    cataracts removed, /w "cyrstal lenses"   . SHOULDER ARTHROSCOPY Right    x2   RCR- spurs removed     There were no vitals filed for this visit.      Subjective Assessment - 04/05/16 1330    Subjective " I want to work on my speech"    Patient is accompained by:  Family member   Special Tests MOCA    Currently in Pain? No/denies   Multiple Pain Sites No            SLP Evaluation [facility] - 04/06/16 0001      SLP Visit Information   SLP Received On 04/05/16   Referring Provider Dr. Alysia Penna    Onset Date 03-20-16   Medical Diagnosis CVA   PT/OT/SLP Co-Evaluation/Treatment --  pending PT eval, requesting OT referral    Reason Eval/Treat Not Completed Other (comment)  PT eval is not yet scheduled      Subjective   Subjective Pt. was cooperative during visit   Patient/Family Stated Goal to improve speech      Pain Assessment   Currently in Pain? No/denies   Pain Score 0-No pain     General Information   HPI 75 y.o.femalewith h/o HTN, DM, HLD, fibromyalgia, GERD, ACDF Feb 2017 admitted with symptoms of stroke. 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  Failed RN stroke swallow.  MRI left internal capsule infarct.  Admitted to CIR on dys 3, nectar thick liquids due to consistent aspiration of thin liquids on MBS.  Repeat MBS  to determine readiness for advancement given improvements noted at bedside.  Outpatient ST recommended d/t swallowing and cognitive-linguistic deficits.  Behavioral/Cognition cognitive deficits    Mobility Status pt. walks      Prior Functional Status   Cognitive/Linguistic Baseline Within functional limits   Baseline deficit details --  speech and cognition are [facility]    Type of Home House    Lives With Spouse   Available Support Family   Vocation Retired     Associate Professor   Overall Cognitive Status Impaired/Different from baseline   Area of Impairment Memory;Problem solving   Memory Decreased short-term memory   Problem Solving Slow processing   Attention --  WFL    Memory Impaired   Memory Impairment Decreased recall of new information;Decreased short term memory   Decreased Short Term Memory Functional basic   Problem Solving Impaired   Problem Solving Impairment Functional basic    Executive Function Reasoning   Reasoning Impaired   Behaviors Other (comment)  WFL      Auditory Comprehension   Overall Auditory Comprehension Impaired   Yes/No Questions Within Functional Limits   Commands Impaired   Conversation Complex     Reading Comprehension   Reading Status Within funtional limits     Expression   Primary Mode of Expression Verbal     Verbal Expression   Overall Verbal Expression Impaired   Initiation No impairment   Automatic Speech --  WFL   Level of Generative/Spontaneous Verbalization Phrase   Repetition Impaired   Level of Impairment Word level;Phrase level   Naming Impairment   Responsive 73-100% accurate   Confrontation 75-100% accurate   Convergent 50-74% accurate   Divergent 50-74% accurate     Written Expression   Dominant Hand Right   Written Expression Exceptions to [facility]   Overall Writen Expression --  difficulty holding pen      Oral Motor/Sensory Function   Overall Oral Motor/Sensory Function Impaired   Labial ROM Within Functional Limits   Lingual ROM Reduced left;Reduced right   Lingual Coordination Reduced   Facial ROM Reduced right;Reduced left     Motor Speech   Overall Motor Speech Impaired   Intelligibility Intelligibility reduced   Word 50-74% accurate   Phrase 50-74% accurate   Sentence 25-49% accurate   Conversation 25-49% accurate     Standardized Assessments   Standardized Assessments  Other Assessment  MOCA      Plan   Treatment/Interventions --  8 wk                          SLP Education - 04/06/16 1331    Education provided Yes   Education Details Provided aspiration pecautions handout as well as oral motor exercises for speech and swallowing    Person(s) Educated Patient;Spouse   Methods Demonstration;Explanation   Comprehension Verbalized understanding;Returned demonstration          SLP Short Term Goals - 04/06/16 1653      SLP SHORT TERM GOAL #1   Title Pt. will name  items up to 12 items in a given category with accuracy in 8/10 trials with min assistance.    Baseline names 2 items in a category    Time 8   Period Weeks   Status New     SLP SHORT TERM GOAL #2   Title Pt. will complete recall of information for completion of functional daily tasks with 90% accuracy with no assistance.    Baseline 70% accuracy    Time 8   Period Weeks     SLP SHORT TERM  GOAL #3   Title Pt. will complete oral motor exercises, pharyngeal strengthening, and lingual ROM exercises with accuracy in 8/10 trials given minimal feedback.    Baseline 50% accuracy    Time 8   Period Weeks   Status New     SLP SHORT TERM GOAL #4   Title Pt. will demonstrate use and understanding of safe swallowing strategeis and aspiration precautions with accuracy in 9/10 trials across 3 sessions with no assistance.    Baseline 70% accuracy    Time 8   Period Weeks   Status New     SLP SHORT TERM GOAL #5   Title Pt. will use compensatory strategies for improved speech intelligiblity with min verbal cues in 9/10 trials.    Baseline 50%   Time 8   Period Weeks   Status New          SLP Long Term Goals - 04/06/16 1700      SLP LONG TERM GOAL #1   Title Pt. will increase speech intelligibility to 85% during conversational exchanges with no assistance.    Baseline 60% accuracy    Time 8   Period Weeks   Status New     SLP LONG TERM GOAL #2   Title Pt. will complete expressive language tasks with 85% accuracy and no assistance.    Baseline 20% accuracy    Time 8   Period Weeks   Status New     SLP LONG TERM GOAL #3   Title Pt. will safelty swallow least restrictive diet with no s/s of aspiration in 9/10 trials and no cues.    Baseline 50% accuracy    Time 8   Period Weeks   Status New          Plan - 04/05/16 1622    Clinical Impression Statement 75 y/o female with recent CVA (Novemver 2017) dx of arthritis, asthma, COPD, oxygen at night, depression, DM, fibromyalgia,  GERD, HTN, and parkinson neuropathy. Recent MBSS (03/29/16) recommends regular solids and thin liquisd with use of aspiration precautions and no mixed consistency  as well as whole meds in puree. During oral mech exam, noted  decreased initiation of swallowing, weak  airway protection, and decreased lingual ROM and coordination.  No overt s/s of aspiration were noted with  crackers and thin water via cup sip today.  Educated pt. and husband about safe swallowing  precautions and provided handout. Administered the [facility] with pt. and she scored  a  22/30 with deficits noted in executive functioning, category naming,  auditory coprehensive and recall. Pt. also presents with moderate dysarthria , with approximately 60% speech intelligibility in short phrases.  Patient's main goal is to improve her speech.  She presents with  impairments in language, cognition, and swallowing and would benefit for ST intervention . Pt. agreeable to ST POC.    Speech Therapy Frequency 2x / week   Duration Other (comment)  8 weeks    Treatment/Interventions Aspiration precaution training;Pharyngeal strengthening exercises;Diet toleration management by SLP;Oral motor exercises;Compensatory strategies;Cognitive reorganization;Trials of upgraded texture/liquids;Patient/family education;Compensatory techniques   Potential to Achieve Goals Good   SLP Home Exercise Plan provided aspiration precautions and oral mechanism exercises for speech and swallowing    Consulted and Agree with Plan of Care Patient;Family member/caregiver   Family Member Consulted patient's husband       Patient will benefit from skilled therapeutic intervention in order to improve the following deficits and impairments:   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 Type 2 diabetes mellitus with peripheral neuropathy (HCC)   . Benign essential HTN   . Fibromyalgia   . Chronic pain syndrome   . Chronic obstructive pulmonary disease ([place])   . Gait disturbance, post-stroke   . Dysarthria, post-stroke   . Dysphagia, post-stroke   . AKI (acute kidney injury) ([place])   . Acute blood loss anemia   . 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 Mixed hyperlipidemia   . Stroke ([place]) 03/20/2016  . CVA (cerebral vascular accident) ([place]) 03/20/2016  . Allergic rhinitis 12/17/2015  . Primary osteoarthritis of both knees 11/19/2015  . Obesity (BMI 30-39.9) 09/04/2015  . Essential hypertension 09/04/2015  . Constipation 09/04/2015  . Asthma 08/26/2015  . Raynaud's phenomenon 08/26/2015  . Gastro-esophageal reflux disease with esophagitis 08/26/2015  . Spinal stenosis 08/26/2015  . Vitamin D deficiency 08/26/2015  . Diabetes mellitus, type 2 ([place]) 08/22/2015  . HNP (herniated nucleus pulposus), cervical 07/10/2015  . Cervical radicular pain 04/30/2013  . Knee osteoarthritis 01/05/2012    Thank you,   Renato Gails. Megan Salon [place], CCC-SLP  Speech-Language Pathologist [phone]     Luther Redo 04-09-16, 5:34 PM  [facility] [address] [place], [place], [address] Phone: [phone]   Fax:  [phone]  Name: Susan Patel MRN: [id number] Date of Birth: 03-11-1941

## 2016-04-12 ENCOUNTER — Encounter: Payer: Medicare Other | Admitting: Physical Medicine & Rehabilitation

## 2016-04-19 ENCOUNTER — Ambulatory Visit (HOSPITAL_COMMUNITY): Payer: Medicare Other | Attending: Family

## 2016-04-19 ENCOUNTER — Encounter (HOSPITAL_COMMUNITY): Payer: Self-pay

## 2016-04-19 DIAGNOSIS — R2681 Unsteadiness on feet: Secondary | ICD-10-CM | POA: Insufficient documentation

## 2016-04-19 DIAGNOSIS — I69318 Other symptoms and signs involving cognitive functions following cerebral infarction: Secondary | ICD-10-CM | POA: Insufficient documentation

## 2016-04-19 DIAGNOSIS — M6281 Muscle weakness (generalized): Secondary | ICD-10-CM | POA: Insufficient documentation

## 2016-04-19 DIAGNOSIS — R296 Repeated falls: Secondary | ICD-10-CM | POA: Diagnosis not present

## 2016-04-19 DIAGNOSIS — R1312 Dysphagia, oropharyngeal phase: Secondary | ICD-10-CM | POA: Insufficient documentation

## 2016-04-19 DIAGNOSIS — R41841 Cognitive communication deficit: Secondary | ICD-10-CM | POA: Insufficient documentation

## 2016-04-19 DIAGNOSIS — R278 Other lack of coordination: Secondary | ICD-10-CM | POA: Diagnosis not present

## 2016-04-19 DIAGNOSIS — I6322 Cerebral infarction due to unspecified occlusion or stenosis of basilar arteries: Secondary | ICD-10-CM | POA: Diagnosis not present

## 2016-04-19 NOTE — Therapy (Signed)
[place], [place], [address] Phone: [phone]   Fax:  [phone]  Speech Language Pathology Treatment  Patient Details  Name: Susan Patel MRN: [id number] Date of Birth: [birth year] Referring Provider: Dr. Alysia Penna   Encounter Date: 04/19/2016      End of Session - 04/19/16 1542    Visit Number 2   Number of Visits 17   Date for SLP Re-Evaluation 06/02/15   Authorization Type medicare part A - primary; BCBS state health -secondary    SLP Start Time 1315   SLP Stop Time  1345   SLP Time Calculation (min) 30 min   Activity Tolerance Patient tolerated treatment well      Past Medical History:  Diagnosis Date  . Arthritis    'all over"  . Asthma   . COPD (chronic obstructive pulmonary disease) (HCC)    O2 per [place] at nights   . Depression   . Diabetes mellitus without complication ([place])    borderline- , states she was on med., but MD told her "everything is under control so I threw the bottle away"  . Fibromyalgia   . GERD (gastroesophageal reflux disease)   . Hypertension   . Neuromuscular disorder (HCC)    parkinson, neuropathy- both feet & hands.    Past Surgical History:  Procedure Laterality Date  . ABDOMINAL HYSTERECTOMY    . ANTERIOR CERVICAL DECOMP/DISCECTOMY FUSION N/A 07/10/2015   Procedure: Cervical five-six, Cervical six-seven anterior cervical decompression with fusion plating and bonegraft;  Surgeon: Jovita Gamma, MD;  Location: [facility];  Service: Neurosurgery;  Laterality: N/A;  . APPENDECTOMY    . BIOPSY EYE MUSCLE  03/20/2016   biopsy vessel to right eye due to swelling  . EYE SURGERY Bilateral    cataracts removed, /w "cyrstal lenses"   . SHOULDER ARTHROSCOPY Right    x2   RCR- spurs removed     There were no vitals filed for this visit.      Subjective Assessment - 04/19/16 1540    Subjective "I'm not having troulble swallowing"    Currently in Pain?  No/denies   Multiple Pain Sites No               ADULT SLP TREATMENT - 04/19/16 1604      General Information   Behavior/Cognition Alert   HPI 75 y/o female with recent CVA (Novemver 2017) dx of arthritis, asthma, COPD, oxygen at night, depression, DM, fibromyalgia, GERD, HTN, and parkinson neuropathy. Recent MBSS (03/29/16) recommends regtular solids and thin liquisd with use of aspiration precautions and no mixed consistency as well as whole meds in puree. During oral mech exam, noted decreased initiation of swallowing, weak airway protection, and decreased lingual ROM and coordination. No overt s/s of aspiration were noted with crackers and thin water via cup sip today. Educated pt. and husband about safe swallowing precautions and provided handout. Administered the [facility] with pt. and she scored a 22/30 with deficits noted in executive functioning, category naming, auditory comprehensive and recall. Pt. also presents with moderate dysarthria , with approximately 60% speech intelligibility in short phrases. Patient's main goal is to improve her speech. She presents with impairments in language, cognition, and swallowing and would benefit for ST intervention .      Dysphagia Treatment   Treatment Methods Therapeutic exercise;Compensation strategy training;Patient/caregiver education   Liquids provided via Straw;Cup   Pharyngeal Phase Signs & Symptoms Immediate throat clear  Type of cueing Visual     Cognitive-Linquistic Treatment   Treatment focused on Cognition     Dysphagia Recommendations   Liquids provided via Straw;Cup          SLP Education - 04/19/16 1541    Education provided Yes   Education Details educated regarding use of safe swallowing precautions   Person(s) Educated Patient;Spouse   Methods Explanation   Comprehension Verbalized understanding          SLP Short Term Goals - 04/19/16 1550      SLP SHORT TERM GOAL #1   Title Pt. will name items up to 12  items in a given category with accuracy in 8/10 trials with min assistance.    Baseline names 2 items in a category    Time 8   Period Weeks   Status On-going     SLP SHORT TERM GOAL #2   Title Pt. will complete recall of information for completion of functional daily tasks with 90% accuracy with no assistance.    Baseline 70% accuracy    Time 8   Period Weeks   Status On-going     SLP SHORT TERM GOAL #3   Title Pt. will complete oral motor exercises, pharyngeal strengthening, and lingual ROM exercises with accuracy in 8/10 trials given minimal feedback.    Baseline 50% accuracy    Time 8   Period Weeks   Status New     SLP SHORT TERM GOAL #4   Title Pt. will demonstrate use and understanding of safe swallowing strategeis and aspiration precautions with accuracy in 9/10 trials across 3 sessions with no assistance.    Baseline 70% accuracy    Time 8   Period Weeks   Status On-going     SLP SHORT TERM GOAL #5   Title Pt. will use compensatory strategies for improved speech intelligiblity with min verbal cues in 9/10 trials.    Baseline 50%   Time 8   Period Weeks   Status On-going          SLP Long Term Goals - 04/19/16 1551      SLP LONG TERM GOAL #1   Title Pt. will increase speech intelligibility to 85% during conversational exchanges with no assistance.    Baseline 60% accuracy    Time 8   Period Weeks   Status On-going     SLP LONG TERM GOAL #2   Title Pt. will complete expressive language tasks with 85% accuracy and no assistance.    Baseline 20% accuracy    Time 8   Period Weeks   Status On-going     SLP LONG TERM GOAL #3   Title Pt. will safelty swallow least restrictive diet with no s/s of aspiration in 9/10 trials and no cues.    Baseline 50% accuracy    Time 8   Period Weeks   Status On-going          Plan - 04/19/16 1543    Clinical Impression Statement Pt. was seen for ST to target swallowing, communication, and cognition. She consumed  thins via cup x 5 with no difficulty and via straw x5 with throat clearing x1. She consumed graham cracker with no difficulty. Husband states that she sometimes takes large sips at home with straw - educated her to slow rate of intake with straw. Introduced masako to strengthen pharyngeal musculature, and she produced this x2 with min visual cues. Provided [employer] handout for home exercises. She named  avg. of 10 words in 4 given categories, which is an improvement from last session. She recalled 3 routine daily events today with no difficulty - plan to target more challenging recall tasks in upcoming session. Pt. continues to present with deficits impacting swallowing, cognition, and communication - continued ST is recommended.    Speech Therapy Frequency 2x / week   Duration Other (comment)  8 weeks    Treatment/Interventions Aspiration precaution training;Pharyngeal strengthening exercises;Diet toleration management by SLP;Oral motor exercises;Compensatory strategies;Cognitive reorganization;Trials of upgraded texture/liquids;Patient/family education;Compensatory techniques   Potential to Achieve Goals Good   SLP Home Exercise Plan introduced masako today -handout provided    Consulted and Agree with Plan of Care Patient;Family member/caregiver   Family Member Consulted patient's husband       Patient will benefit from skilled therapeutic intervention in order to improve the following deficits and impairments:   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 Type 2 diabetes mellitus with peripheral neuropathy (HCC)   . Benign essential HTN   . Fibromyalgia   . Chronic pain syndrome   . Chronic obstructive pulmonary disease ([place])   . Gait disturbance, post-stroke   . Dysarthria, post-stroke   . Dysphagia, post-stroke   .  AKI (acute kidney injury) ([place])   . Acute blood loss anemia   . 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)   . Mixed hyperlipidemia   . Stroke ([place]) 03/20/2016  . CVA (cerebral vascular accident) ([place]) 03/20/2016  . Allergic rhinitis 12/17/2015  . Primary osteoarthritis of both knees 11/19/2015  . Obesity (BMI 30-39.9) 09/04/2015  . Essential hypertension 09/04/2015  . Constipation 09/04/2015  . Asthma 08/26/2015  . Raynaud's phenomenon 08/26/2015  . Gastro-esophageal reflux disease with esophagitis 08/26/2015  . Spinal stenosis 08/26/2015  . Vitamin D deficiency 08/26/2015  . Diabetes mellitus, type 2 ([place]) 08/22/2015  . HNP (herniated nucleus pulposus), cervical 07/10/2015  . Cervical radicular pain 04/30/2013  . Knee osteoarthritis 01/05/2012    Thank you,   Renato Gails. Megan Salon [place], CCC-SLP  Speech-Language Pathologist [phone]     Luther Redo 04/19/2016, 4:04 PM  [place] [address] [place], [place], [address] Phone: [phone]   Fax:  [phone]   Name: Susan Patel MRN: [id number] Date of Birth: 1940-05-18

## 2016-04-21 ENCOUNTER — Encounter: Payer: Self-pay | Admitting: Pediatrics

## 2016-04-21 ENCOUNTER — Ambulatory Visit (INDEPENDENT_AMBULATORY_CARE_PROVIDER_SITE_OTHER): Payer: Medicare Other | Admitting: Pediatrics

## 2016-04-21 ENCOUNTER — Ambulatory Visit (HOSPITAL_COMMUNITY): Payer: Medicare Other

## 2016-04-21 ENCOUNTER — Encounter (HOSPITAL_COMMUNITY): Payer: Self-pay

## 2016-04-21 VITALS — BP 183/78 | HR 89 | Temp 97.0°F | Ht 61.0 in | Wt 184.0 lb

## 2016-04-21 DIAGNOSIS — R2681 Unsteadiness on feet: Secondary | ICD-10-CM | POA: Diagnosis not present

## 2016-04-21 DIAGNOSIS — R269 Unspecified abnormalities of gait and mobility: Secondary | ICD-10-CM

## 2016-04-21 DIAGNOSIS — I69398 Other sequelae of cerebral infarction: Secondary | ICD-10-CM | POA: Diagnosis not present

## 2016-04-21 DIAGNOSIS — R1312 Dysphagia, oropharyngeal phase: Secondary | ICD-10-CM

## 2016-04-21 DIAGNOSIS — H65111 Acute and subacute allergic otitis media (mucoid) (sanguinous) (serous), right ear: Secondary | ICD-10-CM

## 2016-04-21 DIAGNOSIS — R296 Repeated falls: Secondary | ICD-10-CM | POA: Diagnosis not present

## 2016-04-21 DIAGNOSIS — G8929 Other chronic pain: Secondary | ICD-10-CM | POA: Diagnosis not present

## 2016-04-21 DIAGNOSIS — I10 Essential (primary) hypertension: Secondary | ICD-10-CM | POA: Diagnosis not present

## 2016-04-21 DIAGNOSIS — R41841 Cognitive communication deficit: Secondary | ICD-10-CM

## 2016-04-21 DIAGNOSIS — Z780 Asymptomatic menopausal state: Secondary | ICD-10-CM | POA: Diagnosis not present

## 2016-04-21 DIAGNOSIS — M6281 Muscle weakness (generalized): Secondary | ICD-10-CM | POA: Diagnosis not present

## 2016-04-21 DIAGNOSIS — I6930 Unspecified sequelae of cerebral infarction: Secondary | ICD-10-CM | POA: Diagnosis not present

## 2016-04-21 DIAGNOSIS — R278 Other lack of coordination: Secondary | ICD-10-CM | POA: Diagnosis not present

## 2016-04-21 DIAGNOSIS — I6322 Cerebral infarction due to unspecified occlusion or stenosis of basilar arteries: Secondary | ICD-10-CM | POA: Diagnosis not present

## 2016-04-21 MED ORDER — LOSARTAN POTASSIUM 50 MG PO TABS: 50.0000 mg | ORAL_TABLET | Freq: Every day | ORAL | 3 refills | Status: DC

## 2016-04-21 MED ORDER — AMOXICILLIN 500 MG PO CAPS: 500.0000 mg | ORAL_CAPSULE | Freq: Two times a day (BID) | ORAL | 0 refills | Status: DC

## 2016-04-21 MED ORDER — TRAMADOL HCL 50 MG PO TABS: 25.0000 mg | ORAL_TABLET | Freq: Every day | ORAL | 0 refills | Status: DC | PRN

## 2016-04-21 NOTE — Progress Notes (Signed)
Subjective:   Patient ID: Susan Patel, female    DOB: April 26, 1941, 75 y.o.   MRN: [id number] CC: Hospitalization Follow-up (Stroke)  HPI: Susan Patel is a 75 y.o. female presenting for Hospitalization Follow-up (Stroke)  Recent hospitalization for acute stroke: Had acute L internal capsule infarct Losartan was held amaryl for DM2 was held, A1c was 6.6 Started back on ASA Still in therapy--OT, PT, speech Has f/u with neurology in two weeks  Pt says has been feelin gwell since discharge Has had three falls at home "tripping over clutter" per husband, magazines next to her normal sitting area or chair in bathroom  Fibromyalgia: continues to have pain hands, feet, lower back Tramadol helps her get up and do ADLs, participate in PT  HTN: no headaches, no chest pain BP has remained elevated at home, over 150  Still with some vision problems, describes as lace over field of vision R eye, sometimes both eyes Was ongoing prior to stroke Has f/u with eye doctor scheduled, seen several specialists for vision problems  Relevant past medical, surgical, family and social history reviewed. Allergies and medications reviewed and updated. History  Smoking Status  . Never Smoker  Smokeless Tobacco  . Never Used   ROS: Per HPI   Objective:    BP (!) 183/78   Pulse 89   Temp 97 F (36.1 C) (Oral)   Ht 5\' 1"  (1.549 m)   Wt 184 lb (83.5 kg)   BMI 34.77 kg/m   Wt Readings from Last 3 Encounters:  04/21/16 184 lb (83.5 kg)  03/31/16 185 lb 9.6 oz (84.2 kg)  03/20/16 180 lb (81.6 kg)    Gen: NAD, alert, cooperative with exam, NCAT EYES: EOMI, no conjunctival injection, or no icterus ENT:  R TM dull, erythematous, L TM erythematous, OP without erythema LYMPH: no cervical LAD CV: NRRR, normal S1/S2, no murmur, distal pulses 2+ b/l Resp: CTABL, no wheezes, normal WOB Ext: No edema, warm Neuro: Alert and oriented, hand grip 4/5 R hand, 5/5 L hand, strength equal b/l shoulder  ext/abduction, elbow ext/abd, speech slow, slightly dysarthric  Assessment & Plan:  [name] was seen today for hospitalization follow-up. I have reviewed the hospital records, labs and imaging. I have reviewed the hospital notes.  Diagnoses and all orders for this visit:  Other chronic pain Pt with fibromyalgia Previously on hydrocodone for fibromyalgia symptoms Pain medication has helped with ongoing pain in hands, feet, back Able to do more ADLs with medication Will refill tramadol today, #30 tabs, take daily as needed once a day Must be seen for further refills Needs utox, chronic pain agreement signed at next visit -     traMADol (ULTRAM) 50 MG tablet; Take 0.5-1 tablets (25-50 mg total) by mouth daily as needed for moderate pain or severe pain.  Essential hypertension Elevated BP Has been elevated since discharge Losartan was held in the hospital for permissive hypertension Cr normal Will restart today Cont to check at home, let me know if stays elevated -     losartan (COZAAR) 50 MG tablet; Take 1 tablet (50 mg total) by mouth daily.  Late effects of cerebral ischemic stroke Remains in speech, OT, PT Pt noticing improvement Walking has been fine  Gait disturbance, post-stroke Frequent falls Post-menopausal Has had three falls at home since discharge In PT now Mechanical falls due to clutter at home, pt and husband to remove magazines from around her chair -     DG [facility] DEXA  Acute mucoid otitis media of right ear Treat with below No h/o allergy to PCN, confirmed with pt and husband -     amoxicillin (AMOXIL) 500 MG capsule; Take 1 capsule (500 mg total) by mouth 2 (two) times daily.  DM2: Cont to hold glimeperide A1c most recently 6.6  Follow up plan: Return in about 4 weeks (around 05/19/2016).   Assunta Found, MD [place]

## 2016-04-21 NOTE — Patient Instructions (Addendum)
Check blood pressure daily Restart losartan 50mg  once a day If regulalry over 150 on top after 5 days, call and let me know  Tramadol 05-1 pill once a day as needed. We have to see you in clinic for any refills.  I ordered a bone density scan

## 2016-04-21 NOTE — Therapy (Signed)
[facility] [place], [place], [address] Phone: [phone]   Fax:  [phone]  Speech Language Pathology Treatment  Patient Details  Name: Susan Patel MRN: [id number] Date of Birth: [birth year] Referring Provider: Dr. Alysia Penna   Encounter Date: 04/21/2016      End of Session - 04/21/16 1623    Visit Number 3   Number of Visits 17   Date for SLP Re-Evaluation 06/02/15   Authorization Type medicare part A - primary; [employer] state health -secondary    SLP Start Time 1302   SLP Stop Time  1347   SLP Time Calculation (min) 45 min   Activity Tolerance Patient tolerated treatment well      Past Medical History:  Diagnosis Date  . Arthritis    'all over"  . Asthma   . COPD (chronic obstructive pulmonary disease) (HCC)    O2 per [place] at nights   . Depression   . Diabetes mellitus without complication ([place])    borderline- , states she was on med., but MD told her "everything is under control so I threw the bottle away"  . Fibromyalgia   . GERD (gastroesophageal reflux disease)   . Hypertension   . Neuromuscular disorder (HCC)    parkinson, neuropathy- both feet & hands.    Past Surgical History:  Procedure Laterality Date  . ABDOMINAL HYSTERECTOMY    . ANTERIOR CERVICAL DECOMP/DISCECTOMY FUSION N/A 07/10/2015   Procedure: Cervical five-six, Cervical six-seven anterior cervical decompression with fusion plating and bonegraft;  Surgeon: Jovita Gamma, MD;  Location: [facility];  Service: Neurosurgery;  Laterality: N/A;  . APPENDECTOMY    . BIOPSY EYE MUSCLE  03/20/2016   biopsy vessel to right eye due to swelling  . EYE SURGERY Bilateral    cataracts removed, /w "cyrstal lenses"   . SHOULDER ARTHROSCOPY Right    x2   RCR- spurs removed     There were no vitals filed for this visit.      Subjective Assessment - 04/21/16 1621    Subjective "I have a cold"    Patient is accompained by: Family member    Currently in Pain? No/denies   Multiple Pain Sites No               ADULT SLP TREATMENT - 04/21/16 0001      General Information   Behavior/Cognition Alert   Patient Positioning Upright in chair   HPI 75 y/o female with recent CVA (Novemver 2017) dx of arthritis, asthma, COPD, oxygen at night, depression, DM, fibromyalgia, GERD, HTN, and parkinson neuropathy. Recent MBSS (03/29/16) recommends regtular solids and thin liquisd with use of aspiration precautions and no mixed consistency as well as whole meds in puree. During oral mech exam, noted decreased initiation of swallowing, weak airway protection, and decreased lingual ROM and coordination. No overt s/s of aspiration were noted with crackers and thin water via cup sip today. Educated pt. and husband about safe swallowing precautions and provided handout. Administered the [facility] with pt. and she scored a 22/30 with deficits noted in executive functioning, category naming, auditory comprehensive and recall. Pt. also presents with moderate dysarthria , with approximately 60% speech intelligibility in short phrases. Patient's main goal is to improve her speech. She presents with impairments in language, cognition, and swallowing and would benefit for ST intervention .      Dysphagia Treatment   Temperature Spikes Noted No   Treatment Methods Therapeutic exercise;Compensation strategy  training;Patient/caregiver education   Other treatment/comments --  mendelsohn completed      Pain Assessment   Pain Assessment No/denies pain     Cognitive-Linquistic Treatment   Treatment focused on Cognition;Dysarthria;Patient/family/caregiver education   Skilled Treatment assesed information recall, assessed naming and assessed formation of phrases and sentences     Dysphagia Recommendations   Diet recommendations Regular     Progression Toward Goals   Progression toward goals Progressing toward goals          SLP Education - 04/21/16 1622     Education provided Yes   Education Details provided education regarding Emergency planning/management officer) Educated Patient;Spouse   Methods Explanation;Handout;Tactile cues;Verbal cues;Demonstration   Comprehension Verbalized understanding;Returned demonstration          SLP Short Term Goals - 04/21/16 1634      SLP SHORT TERM GOAL #1   Title Pt. will name items up to 12 items in a given category with accuracy in 8/10 trials with min assistance.    Baseline names 2 items in a category    Time 8   Period Weeks   Status On-going     SLP SHORT TERM GOAL #2   Title Pt. will complete recall of information for completion of functional daily tasks with 90% accuracy with no assistance.    Baseline 70% accuracy    Time 8   Period Weeks   Status On-going     SLP SHORT TERM GOAL #3   Title Pt. will complete oral motor exercises, pharyngeal strengthening, and lingual ROM exercises with accuracy in 8/10 trials given minimal feedback.    Baseline 50% accuracy    Time 8   Period Weeks   Status New     SLP SHORT TERM GOAL #4   Title Pt. will demonstrate use and understanding of safe swallowing strategeis and aspiration precautions with accuracy in 9/10 trials across 3 sessions with no assistance.    Baseline 70% accuracy    Time 8   Period Weeks   Status On-going     SLP SHORT TERM GOAL #5   Title Pt. will use compensatory strategies for improved speech intelligiblity with min verbal cues in 9/10 trials.    Baseline 50%   Time 8   Period Weeks   Status On-going          SLP Long Term Goals - 04/21/16 1635      SLP LONG TERM GOAL #1   Title Pt. will increase speech intelligibility to 85% during conversational exchanges with no assistance.    Baseline 60% accuracy    Time 8   Period Weeks   Status On-going     SLP LONG TERM GOAL #2   Title Pt. will complete expressive language tasks with 85% accuracy and no assistance.    Baseline 20% accuracy    Time 8   Period Weeks    Status On-going     SLP LONG TERM GOAL #3   Title Pt. will safelty swallow least restrictive diet with no s/s of aspiration in 9/10 trials and no cues.    Baseline 50% accuracy    Time 8   Period Weeks   Status On-going          Plan - 04/21/16 1624    Clinical Impression Statement Pt. was seen by ST to target cognition, communication, and swallowing with husband present. They report no coughing/choking episodes. She states that she's had difficulty with [facility] home exercise-assessed  completion and she completed with max cues. Then, provided explanation of Mendelsohn maneuver with demonstration pt. completed this with increased ease x3 with min cues required. Provided home handout to target this. Pt. recalled new vs. old cards  in a stack of 15 and 20 with accuracy in 3/3 trials with no difficulty, which is an improvement compared to previous sessions. Targeted increased utterance length during responses to picture cards, which she completed with prompting in 80% of opportunities. She completed category naming tasks with 90% accuracy  using picture cards with visual cues. Pt. continues to present with speech and language deficits, continue ST POC.    Speech Therapy Frequency 2x / week   Duration Other (comment)  8 weeks    Treatment/Interventions Aspiration precaution training;Pharyngeal strengthening exercises;Diet toleration management by SLP;Oral motor exercises;Compensatory strategies;Cognitive reorganization;Trials of upgraded texture/liquids;Patient/family education;Compensatory techniques   Potential to Achieve Goals Good   SLP Home Exercise Plan swallowing strategies, communication techniques    Consulted and Agree with Plan of Care Patient;Family member/caregiver   Family Member Consulted patient's husband       Patient will benefit from skilled therapeutic intervention in order to improve the following deficits and impairments:   Cognitive communication deficit  Dysphagia,  oropharyngeal phase    Problem List Patient Active Problem List   Diagnosis Date Noted  . 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 Type 2 diabetes mellitus with peripheral neuropathy (HCC)   . Benign essential HTN   . Fibromyalgia   . Chronic pain syndrome   . Chronic obstructive pulmonary disease ([place])   . Gait disturbance, post-stroke   . Dysarthria, post-stroke   . Dysphagia, post-stroke   . AKI (acute kidney injury) ([place])   . Acute blood loss anemia   . 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 Mixed hyperlipidemia   . Stroke ([place]) 03/20/2016  . CVA (cerebral vascular accident) ([place]) 03/20/2016  . Allergic rhinitis 12/17/2015  . Primary osteoarthritis of both knees 11/19/2015  . Obesity (BMI 30-39.9) 09/04/2015  . Essential hypertension 09/04/2015  . Constipation 09/04/2015  . Asthma 08/26/2015  . Raynaud's phenomenon 08/26/2015  . Gastro-esophageal reflux disease with esophagitis 08/26/2015  . Spinal stenosis 08/26/2015  . Vitamin D deficiency 08/26/2015  . Diabetes mellitus, type 2 ([place]) 08/22/2015  . HNP (herniated nucleus pulposus), cervical 07/10/2015  . Cervical radicular pain 04/30/2013  . Knee osteoarthritis 01/05/2012   Thank you,   Renato Gails. Megan Salon [place], CCC-SLP  Speech-Language Pathologist [phone]     Luther Redo 04/21/2016, 4:42 PM  [facility] [address] [place], [place], [address] Phone: [phone]   Fax:  [phone]   Name: FARRA VONASEK MRN: [id number] Date of Birth: [birth year]

## 2016-04-23 ENCOUNTER — Ambulatory Visit (HOSPITAL_BASED_OUTPATIENT_CLINIC_OR_DEPARTMENT_OTHER): Payer: Medicare Other | Admitting: Physical Medicine & Rehabilitation

## 2016-04-23 ENCOUNTER — Encounter: Payer: Medicare Other | Attending: Physical Medicine & Rehabilitation

## 2016-04-23 ENCOUNTER — Encounter: Payer: Self-pay | Admitting: Physical Medicine & Rehabilitation

## 2016-04-23 VITALS — BP 153/85 | HR 66 | Resp 14

## 2016-04-23 DIAGNOSIS — J45909 Unspecified asthma, uncomplicated: Secondary | ICD-10-CM | POA: Diagnosis not present

## 2016-04-23 DIAGNOSIS — G20 Parkinson's disease: Secondary | ICD-10-CM | POA: Diagnosis not present

## 2016-04-23 DIAGNOSIS — E119 Type 2 diabetes mellitus without complications: Secondary | ICD-10-CM | POA: Diagnosis not present

## 2016-04-23 DIAGNOSIS — I69351 Hemiplegia and hemiparesis following cerebral infarction affecting right dominant side: Secondary | ICD-10-CM | POA: Diagnosis not present

## 2016-04-23 DIAGNOSIS — K219 Gastro-esophageal reflux disease without esophagitis: Secondary | ICD-10-CM | POA: Diagnosis not present

## 2016-04-23 DIAGNOSIS — I69398 Other sequelae of cerebral infarction: Secondary | ICD-10-CM

## 2016-04-23 DIAGNOSIS — I639 Cerebral infarction, unspecified: Secondary | ICD-10-CM

## 2016-04-23 DIAGNOSIS — I10 Essential (primary) hypertension: Secondary | ICD-10-CM | POA: Insufficient documentation

## 2016-04-23 DIAGNOSIS — I69319 Unspecified symptoms and signs involving cognitive functions following cerebral infarction: Secondary | ICD-10-CM | POA: Insufficient documentation

## 2016-04-23 DIAGNOSIS — J449 Chronic obstructive pulmonary disease, unspecified: Secondary | ICD-10-CM | POA: Insufficient documentation

## 2016-04-23 DIAGNOSIS — M797 Fibromyalgia: Secondary | ICD-10-CM | POA: Diagnosis not present

## 2016-04-23 DIAGNOSIS — Z8673 Personal history of transient ischemic attack (TIA), and cerebral infarction without residual deficits: Secondary | ICD-10-CM

## 2016-04-23 DIAGNOSIS — R269 Unspecified abnormalities of gait and mobility: Secondary | ICD-10-CM | POA: Diagnosis not present

## 2016-04-23 NOTE — Progress Notes (Signed)
Subjective:    Patient ID: Susan Patel, female    DOB: [birth year], 75 y.o.   MRN: [id number] 75 y.o.femalewith history of T2DM with neuropathy, HTN, fibromyalgia, chronic pain, COPD who was admitted on 03/20/16 with confusion, rightfacial droop with slurred speech and word finding deficits as well as difficulty walking. 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 Neurology consulted and recommended resuming ASA (as has not been compliant)  for thrombotic stroke due to SVD. Patient with resultant, balance deficits, aphasia, dysphagia and poor safety awareness with impulsivity affecting safety with mobility.  HPI Admit date: 03/23/2016 Discharge date: 03/31/2016 Recent modified barium swallow upgrade to regular diet, thin liquids. Continues to receive outpatient speech therapy MOCA 22/30. Deficits in executive functioning category naming auditory comprehension and recall Has 7 more weeks of SLP  Dressing and bathing are mod I In one day 3 falls- tripped over mail, tripped over a chair and once while removing pants Husband has cleared off floor,no falls since BP med restarted per PCP, Cozaar 50mg  on 12/6  Chronic constipation on Miralax Pain Inventory Average Pain 8 Pain Right Now 9 My pain is stabbing and aching  In the last 24 hours, has pain interfered with the following? General activity 9 Relation with others 9 Enjoyment of life 9 What TIME of day is your pain at its worst? evening Sleep (in general) Good  Pain is worse with: bending Pain improves with: medication Relief from Meds: no selection  Mobility walk without assistance walk with assistance use a cane ability to climb steps?  yes do you drive?  yes Do you have any goals in this area?  yes  Function retired  Neuro/Psych No problems in this area  Prior Studies hospital f/u  Physicians involved in your  care hospital f/u   Family History  Problem Relation Age of Onset  . Heart disease Mother   . Cancer Father   . Heart disease Father    Social History   Social History  . Marital status: Married    Spouse name: N/A  . Number of children: N/A  . Years of education: N/A   Social History Main Topics  . Smoking status: Never Smoker  . Smokeless tobacco: Never Used  . Alcohol use No  . Drug use: No  . Sexual activity: Not Asked   Other Topics Concern  . None   Social History Narrative  . None   Past Surgical History:  Procedure Laterality Date  . ABDOMINAL HYSTERECTOMY    . ANTERIOR CERVICAL DECOMP/DISCECTOMY FUSION N/A 07/10/2015   Procedure: Cervical five-six, Cervical six-seven anterior cervical decompression with fusion plating and bonegraft;  Surgeon: Jovita Gamma, MD;  Location: [facility];  Service: Neurosurgery;  Laterality: N/A;  . APPENDECTOMY    . BIOPSY EYE MUSCLE  03/20/2016   biopsy vessel to right eye due to swelling  . EYE SURGERY Bilateral    cataracts removed, /w "cyrstal lenses"   . SHOULDER ARTHROSCOPY Right    x2   RCR- spurs removed    Past Medical History:  Diagnosis Date  . Arthritis    'all over"  . Asthma   . COPD (chronic obstructive pulmonary disease) (HCC)    O2 per [place] at nights   . Depression   . Diabetes mellitus without complication ([employer])    borderline- , states she was on med., but MD told her "everything is under control so  I threw the bottle away"  . Fibromyalgia   . GERD (gastroesophageal reflux disease)   . Hypertension   . Neuromuscular disorder (HCC)    parkinson, neuropathy- both feet & hands.   BP (!) 153/85 (BP Location: Left Arm, Patient Position: Sitting, Cuff Size: Large)   Pulse 66   Resp 14   SpO2 95%   Opioid Risk Score:   Fall Risk Score:  `1  Depression screen PHQ 2/9  Depression screen [facility] 2/9 04/21/2016 01/01/2016 12/17/2015 10/23/2015 08/21/2015  Decreased Interest 0 0 0 0 0  Down, Depressed, Hopeless  0 0 0 0 0  PHQ - 2 Score 0 0 0 0 0    Review of Systems  Constitutional: Negative.   HENT: Negative.   Eyes: Negative.   Respiratory: Negative.   Cardiovascular: Negative.   Gastrointestinal: Negative.   Endocrine: Negative.   Genitourinary: Negative.   Musculoskeletal: Negative.   Skin: Negative.   Allergic/Immunologic: Negative.   Neurological: Negative.   Hematological: Negative.   Psychiatric/Behavioral: Negative.   All other systems reviewed and are negative.      Objective:   Physical Exam  Constitutional: She appears well-developed and well-nourished.  Psychiatric: Judgment and thought content normal. Her affect is blunt. Her speech is delayed. She is slowed. Cognition and memory are impaired.  Nursing note and vitals reviewed.  Oriented to person, place not time  Neuro:  Eyes without evidence of nystagmus  Tone is normal without evidence of spasticity Cerebellar exam shows no evidence of ataxia on finger nose finger or heel to shin testing No evidence of trunkal ataxia Romberg negative Motor strength is 5/5 in bilateral deltoid, biceps, triceps, finger flexors and extensors, wrist flexors and extensors, hip flexors, knee flexors and extensors, ankle dorsiflexors, plantar flexors, invertors and evertors, toe flexors and extensors  Sensory exam is normal to pinprick, proprioception and light touch in the upper and lower limbs   Cranial nerves II- Visual fields are intact to confrontation testing, no blurring of vision III- no evidence of ptosis, upward, downward and medial gaze intact IV- no vertical diplopia or head tilt V- no facial numbness or masseter weakness VI- no pupil abduction weakness VII-Central 7 . Right, good lid closure VII- normal auditory acuity IX- no pharygeal weakness,  X- no pharyngeal weakness, no hoarseness XI- no trap or SCM weakness XII- no glossal weakness  Delayed response time     Assessment & Plan:  1. 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 Weakness has resolved, still has cognitive dysfunction. Mild balance deficits which are in part due to poor attention to environment.  Husband is now maintaining safe environment. No recurrent falls.  No further physical medicine and rehabilitation follow-up needed Husband has not made a follow-up appointment with neurology, will make referral to help initiate the process. Follow-up with primary care physician

## 2016-04-23 NOTE — Patient Instructions (Signed)
Neurology office will call you to make appointment, Dr.Xu

## 2016-04-26 ENCOUNTER — Encounter (HOSPITAL_COMMUNITY): Payer: Self-pay

## 2016-04-26 ENCOUNTER — Ambulatory Visit (HOSPITAL_COMMUNITY): Payer: Medicare Other

## 2016-04-26 DIAGNOSIS — R278 Other lack of coordination: Secondary | ICD-10-CM | POA: Diagnosis not present

## 2016-04-26 DIAGNOSIS — R1312 Dysphagia, oropharyngeal phase: Secondary | ICD-10-CM

## 2016-04-26 DIAGNOSIS — R41841 Cognitive communication deficit: Secondary | ICD-10-CM | POA: Diagnosis not present

## 2016-04-26 DIAGNOSIS — M6281 Muscle weakness (generalized): Secondary | ICD-10-CM | POA: Diagnosis not present

## 2016-04-26 DIAGNOSIS — R296 Repeated falls: Secondary | ICD-10-CM | POA: Diagnosis not present

## 2016-04-26 DIAGNOSIS — R2681 Unsteadiness on feet: Secondary | ICD-10-CM | POA: Diagnosis not present

## 2016-04-26 DIAGNOSIS — I6322 Cerebral infarction due to unspecified occlusion or stenosis of basilar arteries: Secondary | ICD-10-CM | POA: Diagnosis not present

## 2016-04-26 NOTE — Therapy (Signed)
[facility] [place], [place], [address] Phone: [phone]   Fax:  [phone]  Speech Language Pathology Treatment  Patient Details  Name: Susan Patel MRN: [id number] Date of Birth: [birth year] Referring Provider: Dr. Alysia Penna   Encounter Date: 04/26/2016      End of Session - 04/26/16 1405    Visit Number 4   Number of Visits 17   Date for SLP Re-Evaluation 06/02/15   Authorization Type medicare part A - primary; [employer] state health -secondary    SLP Start Time 1257   SLP Stop Time  1342   SLP Time Calculation (min) 45 min   Activity Tolerance Patient tolerated treatment well      Past Medical History:  Diagnosis Date  . Arthritis    'all over"  . Asthma   . COPD (chronic obstructive pulmonary disease) (HCC)    O2 per [place] at nights   . Depression   . Diabetes mellitus without complication ([place])    borderline- , states she was on med., but MD told her "everything is under control so I threw the bottle away"  . Fibromyalgia   . GERD (gastroesophageal reflux disease)   . Hypertension   . Neuromuscular disorder (HCC)    parkinson, neuropathy- both feet & hands.    Past Surgical History:  Procedure Laterality Date  . ABDOMINAL HYSTERECTOMY    . ANTERIOR CERVICAL DECOMP/DISCECTOMY FUSION N/A 07/10/2015   Procedure: Cervical five-six, Cervical six-seven anterior cervical decompression with fusion plating and bonegraft;  Surgeon: Jovita Gamma, MD;  Location: [facility];  Service: Neurosurgery;  Laterality: N/A;  . APPENDECTOMY    . BIOPSY EYE MUSCLE  03/20/2016   biopsy vessel to right eye due to swelling  . EYE SURGERY Bilateral    cataracts removed, /w "cyrstal lenses"   . SHOULDER ARTHROSCOPY Right    x2   RCR- spurs removed     There were no vitals filed for this visit.      Subjective Assessment - 04/26/16 1402    Subjective " I'm doing ok today"   Patient is accompained by: Family  member   Currently in Pain? No/denies   Multiple Pain Sites No               ADULT SLP TREATMENT - 04/26/16 0001      General Information   Behavior/Cognition Alert   Patient Positioning Upright in chair   HPI 75 y/o female with recent CVA (Novemver 2017) dx of arthritis, asthma, COPD, oxygen at night, depression, DM, fibromyalgia, GERD, HTN, and parkinson neuropathy. Recent MBSS (03/29/16) recommends regtular solids and thin liquisd with use of aspiration precautions and no mixed consistency as well as whole meds in puree. During oral mech exam, noted decreased initiation of swallowing, weak airway protection, and decreased lingual ROM and coordination. No overt s/s of aspiration were noted with crackers and thin water via cup sip today. Educated pt. and husband about safe swallowing precautions and provided handout. Administered the [facility] with pt. and she scored a 22/30 with deficits noted in executive functioning, category naming, auditory comprehensive and recall. Pt. also presents with moderate dysarthria , with approximately 60% speech intelligibility in short phrases. Patient's main goal is to improve her speech. She presents with impairments in language, cognition, and swallowing and would benefit for ST intervention .      Treatment Provided   Treatment provided Cognitive-Linquistic;Dysphagia     Dysphagia Treatment  Temperature Spikes Noted No   Treatment Methods Skilled observation;Therapeutic exercise;Compensation strategy training;Patient/caregiver education   Patient observed directly with PO's Yes   Type of PO's observed Regular   Feeding Able to feed self   Liquids provided via Cup   Pharyngeal Phase Signs & Symptoms --  WFL      Pain Assessment   Pain Assessment No/denies pain     Cognitive-Linquistic Treatment   Treatment focused on Cognition   Skilled Treatment assesed information recall, assessed naming and assessed formation of phrases and sentences      Dysphagia Recommendations   Diet recommendations Regular   Liquids provided via Cup;Straw     Progression Toward Goals   Progression toward goals Progressing toward goals          SLP Education - 04/26/16 1403    Education Details educated pt. to increase speech productions at home in order to gain awareness regarding her speech clarity    Person(s) Educated Patient;Spouse   Methods Explanation   Comprehension Verbalized understanding          SLP Short Term Goals - 04/26/16 1421      SLP SHORT TERM GOAL #1   Title Pt. will name items up to 12 items in a given category with accuracy in 8/10 trials with min assistance.    Baseline names 2 items in a category    Time 8   Period Weeks   Status On-going     SLP SHORT TERM GOAL #2   Title Pt. will complete recall of information for completion of functional daily tasks with 90% accuracy with no assistance over 5 sessions.    Baseline 70% accuracy    Time 8   Period Weeks   Status Revised     SLP SHORT TERM GOAL #3   Title Pt. will complete oral motor exercises, pharyngeal strengthening, and lingual ROM exercises with accuracy in 8/10 trials given minimal feedback.    Baseline 50% accuracy    Time 8   Period Weeks   Status Partially Met     SLP SHORT TERM GOAL #4   Title Pt. will demonstrate use and understanding of safe swallowing strategies and aspiration precautions with accuracy in 9/10 trials across 3 sessions with no assistance.    Baseline 70% accuracy    Time 8   Period Weeks   Status Partially Met     SLP SHORT TERM GOAL #5   Title Pt. will use compensatory strategies for improved speech intelligiblity with min verbal cues in 9/10 trials.    Baseline 50%   Time 8   Period Weeks   Status On-going          SLP Long Term Goals - 04/26/16 1428      SLP LONG TERM GOAL #1   Title Pt. will increase speech intelligibility to 85% during conversational exchanges with no assistance.    Baseline 60% accuracy     Time 8   Period Weeks   Status On-going     SLP LONG TERM GOAL #2   Title Pt. will complete expressive language tasks with 85% accuracy and no assistance.    Baseline 20% accuracy    Time 8   Period Weeks   Status On-going     SLP LONG TERM GOAL #3   Title Pt. will safelty swallow least restrictive diet with no s/s of aspiration in 9/10 trials and no cues.    Baseline 50% accuracy    Time 8  Period Weeks   Status On-going          Plan - 04/26/16 1406    Clinical Impression Statement Pt. was seen for speech therapy to target improving cognitive and swallowing ability, with husband present throughout session. She reports no coughing of choking episodes at home with regular consistency and thin liquids. She consumed graham cracker and thin liquids with no overt s/s of aspiration today. She answered questions about safe swallowing strategies and techniques with no difficulty. She completed masako x5 with minimal difficulty today. Pt. had no questions about swallowing precautions or recommended diet consistency. She then participated in timed category naming tasks (parts of a home, types of drinks, and TV shows), and she named an average of 7 words in a given category in 1 minute. Assessed recall using spaced retrieval tasks and she independently recalled 3/3 words with 10, 15, and 25 minute delay. Introduced word association strategy to enable improved recall of information, which she demonstrated use of with no difficulty today. She answered basic questions about safety awareness cards with mod verbal cues to increase length of response (3+ words), which she completed with 75% accuracy. Pt. states that she would like to target speech clarity in next ST session. Pt. continues to present with deficits impacting swallowing, communication, and cognition - Continue with ST intervention.    Speech Therapy Frequency 2x / week   Duration Other (comment)  8 weeks    Treatment/Interventions  Aspiration precaution training;Pharyngeal strengthening exercises;Diet toleration management by SLP;Oral motor exercises;Compensatory strategies;Cognitive reorganization;Trials of upgraded texture/liquids;Patient/family education;Compensatory techniques   Potential to Achieve Goals Good   SLP Home Exercise Plan swallowing strategies, communication techniques    Consulted and Agree with Plan of Care Patient;Family member/caregiver   Family Member Consulted patient's husband       Patient will benefit from skilled therapeutic intervention in order to improve the following deficits and impairments:   Cognitive communication deficit  Dysphagia, oropharyngeal phase    Problem List Patient Active Problem List   Diagnosis Date Noted  . 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 Frequent falls 04/21/2016  . 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 Type 2 diabetes mellitus with peripheral neuropathy (HCC)   . Fibromyalgia   . Chronic pain syndrome   . Chronic obstructive pulmonary disease ([place])   . Gait disturbance, post-stroke   . Dysarthria, post-stroke   . Dysphagia, post-stroke   . AKI (acute kidney injury) ([place])   . Acute blood loss anemia   . 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 Mixed hyperlipidemia   . 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  . Allergic rhinitis 12/17/2015  . Primary osteoarthritis of both knees 11/19/2015  . Obesity (BMI 30-39.9) 09/04/2015  . Essential hypertension 09/04/2015  . Constipation 09/04/2015  . Asthma 08/26/2015  . Raynaud's phenomenon 08/26/2015  . Gastro-esophageal reflux disease with esophagitis 08/26/2015  . Spinal stenosis 08/26/2015  . Vitamin D deficiency 08/26/2015  . Diabetes mellitus, type 2 ([place]) 08/22/2015  . HNP (herniated nucleus pulposus), cervical 07/10/2015  . Cervical radicular pain 04/30/2013  . Knee osteoarthritis 01/05/2012    Thank you,   Renato Gails. Megan Salon [place], CCC-SLP  Speech-Language Pathologist [phone]      Luther Redo 04/26/2016, 2:29 PM  [facility] [address] [place], [place], [address] Phone: [phone]   Fax:  [phone]   Name: Susan Patel  Susan Patel MRN: [id number] Date of Birth: January 29, 1941

## 2016-04-28 ENCOUNTER — Encounter (HOSPITAL_COMMUNITY): Payer: Self-pay

## 2016-04-28 ENCOUNTER — Ambulatory Visit (HOSPITAL_COMMUNITY): Payer: Medicare Other | Admitting: Physical Therapy

## 2016-04-28 ENCOUNTER — Ambulatory Visit (HOSPITAL_COMMUNITY): Payer: Medicare Other

## 2016-04-28 DIAGNOSIS — R296 Repeated falls: Secondary | ICD-10-CM | POA: Diagnosis not present

## 2016-04-28 DIAGNOSIS — R2681 Unsteadiness on feet: Secondary | ICD-10-CM | POA: Diagnosis not present

## 2016-04-28 DIAGNOSIS — I6322 Cerebral infarction due to unspecified occlusion or stenosis of basilar arteries: Secondary | ICD-10-CM | POA: Diagnosis not present

## 2016-04-28 DIAGNOSIS — R41841 Cognitive communication deficit: Secondary | ICD-10-CM | POA: Diagnosis not present

## 2016-04-28 DIAGNOSIS — M6281 Muscle weakness (generalized): Secondary | ICD-10-CM | POA: Diagnosis not present

## 2016-04-28 DIAGNOSIS — R278 Other lack of coordination: Secondary | ICD-10-CM | POA: Diagnosis not present

## 2016-04-28 NOTE — Therapy (Signed)
[facility] [address] [place], [place], [address] Phone: [phone]   Fax:  [phone]  Physical Therapy Evaluation  Patient Details  Name: Susan Patel MRN: [id number] Date of Birth: Apr 21, 1941 Referring Provider: Tyson Dense   Encounter Date: 04/28/2016      PT End of Session - 04/28/16 1600    Visit Number 1   Number of Visits 16   Date for PT Re-Evaluation 05/28/16   Authorization Type medicare   Authorization - Visit Number 1   Authorization - Number of Visits 10   PT Start Time 1350   PT Stop Time 1430   PT Time Calculation (min) 40 min   Activity Tolerance Patient tolerated treatment well   Behavior During Therapy [facility] for tasks assessed/performed      Past Medical History:  Diagnosis Date  . Arthritis    'all over"  . Asthma   . COPD (chronic obstructive pulmonary disease) (HCC)    O2 per [place] at nights   . Depression   . Diabetes mellitus without complication ([place])    borderline- , states she was on med., but MD told her "everything is under control so I threw the bottle away"  . Fibromyalgia   . GERD (gastroesophageal reflux disease)   . Hypertension   . Neuromuscular disorder (HCC)    parkinson, neuropathy- both feet & hands.    Past Surgical History:  Procedure Laterality Date  . ABDOMINAL HYSTERECTOMY    . ANTERIOR CERVICAL DECOMP/DISCECTOMY FUSION N/A 07/10/2015   Procedure: Cervical five-six, Cervical six-seven anterior cervical decompression with fusion plating and bonegraft;  Surgeon: Jovita Gamma, MD;  Location: [facility];  Service: Neurosurgery;  Laterality: N/A;  . APPENDECTOMY    . BIOPSY EYE MUSCLE  03/20/2016   biopsy vessel to right eye due to swelling  . EYE SURGERY Bilateral    cataracts removed, /w "cyrstal lenses"   . SHOULDER ARTHROSCOPY Right    x2   RCR- spurs removed     There were no vitals filed for this visit.       Subjective Assessment - 04/28/16 1356     Subjective Information is provided via chart review.  Pt hasd a stroke on 03/20/2016 she was discharged to inpatient on 03/23/2016 and discharged home on 06/01/2015.  Per pt husband she has always had difficulty going up and down steps,  but he has noticed that getting up and down from low chairs and getting in and out of bed is more difficult than prior to the stroke.     Pertinent History fibromyalgia, DM, HTN,    How long can you sit comfortably? no problem   How long can you stand comfortably? not sure    How long can you walk comfortably? 30 minutes or longer    Currently in Pain? --  Multiple complaints from fibromyalgia chronic and will not be addressed.             [facility] PT Assessment - 04/28/16 0001      Assessment   Medical Diagnosis Lt CVA   Referring Provider Tyson Dense    Onset Date/Surgical Date 03/20/16   Hand Dominance Right   Next MD Visit not scheduled   Prior Therapy IP     Precautions   Precautions Fall     Restrictions   Weight Bearing Restrictions No     Balance Screen   Has the patient fallen in the past 6 months Yes  How many times? 3  once slipped on magazine; once tripped over table, 3rd tub   Has the patient had a decrease in activity level because of a fear of falling?  Yes   Is the patient reluctant to leave their home because of a fear of falling?  No     Home Environment   Living Environment Private residence   Type of [place] Access Stairs to enter   Entrance Stairs-Number of Steps 5   Entrance Stairs-Rails Right   [place] to live on main level with bedroom/bathroom     Prior Function   Level of Independence Independent   Leisure play piano, keyboard      Cognition   Overall Cognitive Status Impaired/Different from baseline   Area of Impairment Memory;Problem solving   Memory Decreased short-term memory   Problem Solving Slow processing   Attention --  WFL    Memory Impaired   Memory Impairment Decreased  recall of new information;Decreased short term memory   Decreased Short Term Memory Functional basic   Problem Solving Impaired   Problem Solving Impairment Functional basic   Executive Function Reasoning   Reasoning Impaired   Behaviors Other (comment)  WFL      Functional Tests   Functional tests Single leg stance;Sit to Stand     Single Leg Stance   Comments Rt:  3  Lt: 4 seconds      Sit to Stand   Comments 5 x 18 seconds      ROM / Strength   AROM / PROM / Strength Strength     Strength   Strength Assessment Site Hip;Knee;Ankle   Right/Left Hip Right;Left   Right Hip Flexion 5/5   Right Hip Extension 3-/5   Right Hip ABduction 4+/5   Left Hip Flexion 4/5   Left Hip Extension 3-/5   Left Hip ABduction 5/5   Right/Left Knee Right;Left   Right Knee Flexion 4+/5   Right Knee Extension 5/5   Left Knee Flexion 4+/5   Left Knee Extension 5/5   Right/Left Ankle Right;Left   Right Ankle Dorsiflexion 5/5   Left Ankle Dorsiflexion 5/5     Bed Mobility   Rolling Right 6: Modified independent (Device/Increase time)   Rolling Left 6: Modified independent (Device/Increase time)   Supine to Sit 3: Mod assist   Sit to Supine 6: Modified independent (Device/Increase time)     Ambulation/Gait   Ambulation/Gait Yes   Ambulation/Gait Assistance 5: Supervision   Ambulation Distance (Feet) 574 Feet   Assistive device None   Gait Comments 3'      Balance   Balance Assessed Yes     Standardized Balance Assessment   Standardized Balance Assessment Dynamic Gait Index     Dynamic Gait Index   Level Surface Mild Impairment   Change in Gait Speed Mild Impairment   Gait with Horizontal Head Turns Moderate Impairment   Gait with Vertical Head Turns Moderate Impairment   Gait and Pivot Turn Moderate Impairment   Step Over Obstacle Moderate Impairment   Step Around Obstacles Moderate Impairment   Steps Mild Impairment   Total Score 11                            PT Education - 04/28/16 1558    Education provided Yes   Education Details HEP   Person(s) Educated Patient   Methods Explanation;Handout   Comprehension  Verbalized understanding;Returned demonstration          PT Short Term Goals - 04/28/16 1608      PT SHORT TERM GOAL #1   Title Pt single leg stance to be 15 seconds to decrease pt risk of falling    Time 4   Period Weeks   Status New     PT SHORT TERM GOAL #2   Title Pt hip extension strength bilaterally to be at least 4/5 to allow pt to come sit to stand with ease from lower chairs.    Time 4   Period Weeks   Status New     PT SHORT TERM GOAL #3   Title Pt activity tolerance to be increased to where she is able to be up for two hours without having to rest to complete laundry and light housework    Time 4   Period Weeks   Status New           PT Long Term Goals - 04/28/16 1611      PT LONG TERM GOAL #1   Title Pt to be able to single leg stance for 30 seconds to allow pt to be confident walking on uneven ground   Time 8   Period Weeks   Status New     PT LONG TERM GOAL #2   Title Pt core strength to be increased one grade to allow pt to be able to complete bed mobilty with ease.    Time 6   Period Weeks   Status New     PT LONG TERM GOAL #3   Title Pt activity tolerance to be increased to allow pt to be up for 3 hours without resting to allow patient to go on outings.    Time 8   Period Weeks   Status New     PT LONG TERM GOAL #4   Title Pt to have had no falls in the past four weeks    Time 8   Period Weeks   Status New               Plan - 04/28/16 1601    Clinical Impression Statement Ms. Westgard is a 75 yo female who had a Lt middle cerebral artery infarct on 03/20/2016.  She was discharged to in-patient rehab on 03/23/16 and discharged home on 03/31/2016.  She has not recieved any home health and is being referred to outpatient therapy to return her to her maximal functional level.   Per pt husband the main deficit that he feels his wife has is with bed mobility and balance.  Ms. Stingley's evaluation demonstrates weakened gluteal maximus, decreased balance and decreased coordination and abnormal gait.  She will benefit from skilled physical therapy to maximize her functional ability and decrease her risk of falling.    Rehab Potential Good   PT Frequency 2x / week   PT Duration 8 weeks   PT Treatment/Interventions ADLs/Self Care Home Management;Gait training;Functional mobility training;Therapeutic activities;Therapeutic exercise;Balance training;Neuromuscular re-education;Patient/family education   PT Next Visit Plan begin sit to stand, lunging, narrow base of support with arm motion, tandem stance, progress to tandem gait if tandem stance goes well.     PT Home Exercise Plan heel raises, functional squats and prone SLR    Consulted and Agree with Plan of Care Patient;Family member/caregiver   Family Member Consulted husband       Patient will benefit from skilled therapeutic intervention in order to improve the  following deficits and impairments:  Abnormal gait, Decreased activity tolerance, Decreased balance, Decreased mobility, Decreased strength  Visit Diagnosis: Unsteadiness on feet - Plan: PT plan of care cert/re-cert  Repeated falls - Plan: PT plan of care cert/re-cert  Muscle weakness (generalized) - Plan: PT plan of care cert/re-cert      G-Codes - [id number]    Functional Assessment Tool Used clinical judgement:  history of falls; 3 minutes walk test    Functional Limitation Mobility: Walking and moving around   Mobility: Walking and Moving Around Current Status [id number]) At least 20 percent but less than 40 percent impaired, limited or restricted   Mobility: Walking and Moving Around Goal Status [id number]) At least 1 percent but less than 20 percent impaired, limited or restricted       Problem List Patient Active Problem List   Diagnosis Date Noted  .  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 Frequent falls 04/21/2016  . 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 Type 2 diabetes mellitus with peripheral neuropathy (HCC)   . Fibromyalgia   . Chronic pain syndrome   . Chronic obstructive pulmonary disease ([place])   . Gait disturbance, post-stroke   . Dysarthria, post-stroke   . Dysphagia, post-stroke   . AKI (acute kidney injury) ([place])   . Acute blood loss anemia   . 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 Mixed hyperlipidemia   . 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  . Allergic rhinitis 12/17/2015  . Primary osteoarthritis of both knees 11/19/2015  . Obesity (BMI 30-39.9) 09/04/2015  . Essential hypertension 09/04/2015  . Constipation 09/04/2015  . Asthma 08/26/2015  . Raynaud's phenomenon 08/26/2015  . Gastro-esophageal reflux disease with esophagitis 08/26/2015  . Spinal stenosis 08/26/2015  . Vitamin D deficiency 08/26/2015  . Diabetes mellitus, type 2 ([place]) 08/22/2015  . HNP (herniated nucleus pulposus), cervical 07/10/2015  . Cervical radicular pain 04/30/2013  . Knee osteoarthritis 01/05/2012    Rayetta Humphrey, PT CLT [phone] 04/28/2016, 4:22 PM  [facility] [address] [place], [place], [address] Phone: [phone]   Fax:  [phone]  Name: Susan Patel MRN: [id number] Date of Birth: Oct 15, 1940

## 2016-04-28 NOTE — Patient Instructions (Addendum)
Heel Raise: Bilateral (Standing)   At the kitchen sink  Rise on balls of feet. Repeat __10__ times per set. Do _1__ sets per session. Do 2____ sessions per day.  http://orth.exer.us/38   Copyright  VHI. All rights reserved.  Functional Quadriceps: Chair Squat   May due at the kitchen sink Keeping feet flat on floor, shoulder width apart, squat as low as is comfortable. Use support as necessary. Repeat 10____ times per set. Do _1___ sets per session. Do __2__ sessions per day.  http://orth.exer.us/736   Copyright  VHI. All rights reserved.  Straight Leg Raise (Prone)    Abdomen and head supported, keep left knee locked and raise leg at hip. Avoid arching low back. Repeat __10__ times per set. Do __1__ sets per session. Do 2____ sessions per day. 1 http://orth.exer.us/1112   Copyright  VHI. All rights reserved.

## 2016-04-28 NOTE — Addendum Note (Signed)
Addended by: Leeroy Cha on: 04/28/2016 04:43 PM   Modules accepted: Orders

## 2016-04-28 NOTE — Therapy (Signed)
[place], [place], [address] Phone: [phone]   Fax:  [phone]  Speech Language Pathology Treatment  Patient Details  Name: Susan Patel MRN: [id number] Date of Birth: 1940/08/04 Referring Provider: Dr. Alysia Penna   Encounter Date: 04/28/2016      End of Session - 04/28/16 1412    Visit Number 5   Number of Visits 17   Date for SLP Re-Evaluation 06/02/15   Authorization Type medicare part A - primary; BC[employer] state health -secondary    SLP Start Time 1303   SLP Stop Time  1348   SLP Time Calculation (min) 45 min   Activity Tolerance Patient tolerated treatment well      Past Medical History:  Diagnosis Date  . Arthritis    'all over"  . Asthma   . COPD (chronic obstructive pulmonary disease) (HCC)    O2 per [place] at nights   . Depression   . Diabetes mellitus without complication ([place])    borderline- , states she was on med., but MD told her "everything is under control so I threw the bottle away"  . Fibromyalgia   . GERD (gastroesophageal reflux disease)   . Hypertension   . Neuromuscular disorder (HCC)    parkinson, neuropathy- both feet & hands.    Past Surgical History:  Procedure Laterality Date  . ABDOMINAL HYSTERECTOMY    . ANTERIOR CERVICAL DECOMP/DISCECTOMY FUSION N/A 07/10/2015   Procedure: Cervical five-six, Cervical six-seven anterior cervical decompression with fusion plating and bonegraft;  Surgeon: Jovita Gamma, MD;  Location: [facility];  Service: Neurosurgery;  Laterality: N/A;  . APPENDECTOMY    . BIOPSY EYE MUSCLE  03/20/2016   biopsy vessel to right eye due to swelling  . EYE SURGERY Bilateral    cataracts removed, /w "cyrstal lenses"   . SHOULDER ARTHROSCOPY Right    x2   RCR- spurs removed     There were no vitals filed for this visit.      Subjective Assessment - 04/28/16 1409    Subjective "I'm having a good day"    Currently in Pain? No/denies   Multiple Pain Sites No               ADULT SLP TREATMENT - 04/28/16 0001      General Information   Behavior/Cognition Alert   Patient Positioning Upright in chair   HPI 75 y/o female with recent CVA (Novemver 2017) dx of arthritis, asthma, COPD, oxygen at night, depression, DM, fibromyalgia, GERD, HTN, and parkinson neuropathy. Recent MBSS (03/29/16) recommends regtular solids and thin liquisd with use of aspiration precautions and no mixed consistency as well as whole meds in puree. During oral mech exam, noted decreased initiation of swallowing, weak airway protection, and decreased lingual ROM and coordination. No overt s/s of aspiration were noted with crackers and thin water via cup sip today. Educated pt. and husband about safe swallowing precautions and provided handout. Administered the [facility] with pt. and she scored a 22/30 with deficits noted in executive functioning, category naming, auditory comprehensive and recall. Pt. also presents with moderate dysarthria , with approximately 60% speech intelligibility in short phrases. Patient's main goal is to improve her speech. She presents with impairments in language, cognition, and swallowing and would benefit for ST intervention .      Treatment Provided   Treatment provided Cognitive-Linquistic     Dysphagia Treatment   Temperature Spikes Noted No   Treatment Methods  Skilled observation;Upgraded PO texture trial;Other (comment)  educated regarding safe swallowing strategies    Patient observed directly with PO's Yes   Type of PO's observed Regular  mixed consistency    Feeding Able to feed self   Liquids provided via Cup;Straw   Other treatment/comments --  [facility]      Pain Assessment   Pain Assessment No/denies pain     Cognitive-Linquistic Treatment   Treatment focused on Cognition;Dysarthria   Skilled Treatment assessed category naming, assessed compensatory strategies for speech production      Assessment /  Recommendations / Plan   Plan Other (Comment);Continue with current plan of care  Long term swallowing goal met      Dysphagia Recommendations   Diet recommendations Regular   Liquids provided via Cup;Straw     Progression Toward Goals   Progression toward goals Progressing toward goals          SLP Education - 04/28/16 1410    Education provided Yes   Education Details provided education regarding discharging swallowing goals, educated pt. to continue with safe swallowing precautions, educated pt. about need to use over-articulation and slow speech rate when speaking   Person(s) Educated Patient;Spouse   Methods Explanation;Demonstration   Comprehension Verbalized understanding;Returned demonstration          SLP Short Term Goals - 04/28/16 1434      SLP SHORT TERM GOAL #1   Title Pt. will name items up to 12 items in a given category with accuracy in 8/10 trials with min assistance.    Baseline names 2 items in a category    Time 8   Period Weeks   Status On-going     SLP SHORT TERM GOAL #2   Title Pt. will complete recall of information for completion of functional daily tasks with 90% accuracy with no assistance over 5 sessions.    Baseline 70% accuracy    Time 8   Period Weeks   Status Revised     SLP SHORT TERM GOAL #3   Title Pt. will complete oral motor exercises, pharyngeal strengthening, and lingual ROM exercises with accuracy in 8/10 trials given minimal feedback.    Baseline 50% accuracy    Time 8   Period Weeks   Status Not Met     SLP SHORT TERM GOAL #4   Title Pt. will demonstrate use and understanding of safe swallowing strategies and aspiration precautions with accuracy in 9/10 trials across 3 sessions with no assistance.    Baseline 70% accuracy    Time 8   Period Weeks   Status Partially Met     SLP SHORT TERM GOAL #5   Title Pt. will use compensatory strategies for improved speech intelligiblity with min verbal cues in 9/10 trials.     Baseline 50%   Time 8   Period Weeks   Status On-going          SLP Long Term Goals - 04/28/16 1437      SLP LONG TERM GOAL #1   Title Pt. will increase speech intelligibility to 85% during conversational exchanges with no assistance.    Baseline 60% accuracy    Time 8   Period Weeks   Status On-going     SLP LONG TERM GOAL #2   Title Pt. will complete expressive language tasks with 85% accuracy and no assistance.    Baseline 20% accuracy    Time 8   Period Weeks   Status On-going  SLP LONG TERM GOAL #3   Title Pt. will safelty swallow least restrictive diet with no s/s of aspiration in 9/10 trials and no cues.    Baseline 50% accuracy    Time 8   Period Weeks   Status Achieved          Plan - 04/28/16 1414    Clinical Impression Statement  Pt. was seen for ST today, with husband present throughout session and providing input as well as feedback when asked. She consumed trials of mixed consistency with combined peaches and graham cracker with no overt s/s of aspiration noted. She consumed thin liquids via cup sip and straw with no overt s/s of aspiration. She answered questions about and used safe swallowing strategies and precautions with 100% accuracy. No reports of coughing/choking at home. Plan to d/c swallowing goals today as they are  met. Pt. completed category naming x5 trials with average of 6  words per trial, she benefited from verbal cues to name more items per category. Targeted speech clarity by implementing  use of over-articulation and slow speech rate today with  SLP repeating  words and phrases pt. stated (without SLP knowledge of words). She correctly articulated 17/18 single syllable words from a list with noted concentration to over-articulate today. She read short phrases aloud from list with difficulty in 3/13 trials. Husband stated that she "does not use her loud voice" with him like she does "when she's in therapy". Plan to target consistent use of  loud voice in next session. Carryover task - instruction to husband to cue pt. to use loud voice and over-articulate during communication exchanges.    Speech Therapy Frequency 2x / week   Duration Other (comment)  8 weeks    Treatment/Interventions Aspiration precaution training;Diet toleration management by SLP;Compensatory strategies;Cognitive reorganization;Trials of upgraded texture/liquids;Patient/family education;Compensatory techniques   Potential to Achieve Goals Good   SLP Home Exercise Plan communication techniques    Consulted and Agree with Plan of Care Patient;Family member/caregiver   Family Member Consulted patient's husband       Patient will benefit from skilled therapeutic intervention in order to improve the following deficits and impairments:   Cognitive communication deficit    Problem List Patient Active Problem List   Diagnosis Date Noted  . 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 Frequent falls 04/21/2016  . 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 Type 2 diabetes mellitus with peripheral neuropathy (HCC)   . Fibromyalgia   . Chronic pain syndrome   . Chronic obstructive pulmonary disease ([place])   . Gait disturbance, post-stroke   . Dysarthria, post-stroke   . Dysphagia, post-stroke   . AKI (acute kidney injury) ([place])   . Acute blood loss anemia   . 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 Mixed hyperlipidemia   . 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  . Allergic rhinitis 12/17/2015  . Primary osteoarthritis of both knees 11/19/2015  . Obesity (BMI 30-39.9) 09/04/2015  . Essential hypertension 09/04/2015  . Constipation 09/04/2015  . Asthma 08/26/2015  . Raynaud's phenomenon 08/26/2015  . Gastro-esophageal reflux disease with esophagitis 08/26/2015  . Spinal stenosis 08/26/2015  . Vitamin D deficiency 08/26/2015  . Diabetes mellitus, type 2 ([place])  08/22/2015  . HNP (herniated nucleus pulposus), cervical 07/10/2015  . Cervical radicular pain 04/30/2013  . Knee osteoarthritis 01/05/2012   Thank you,   Renato Gails. Megan Salon, [place], [place]  Speech-Language Pathologist [phone]     Luther Redo 04/28/2016, 3:34 PM  [facility] [address] [place], [place], [address] Phone: [phone]   Fax:  [phone]   Name: Susan Patel MRN: [id number] Date of Birth: [birth year]

## 2016-04-30 ENCOUNTER — Telehealth: Payer: Self-pay | Admitting: Pediatrics

## 2016-05-03 ENCOUNTER — Encounter (HOSPITAL_COMMUNITY): Payer: Medicare Other

## 2016-05-04 ENCOUNTER — Other Ambulatory Visit: Payer: Medicare Other

## 2016-05-05 ENCOUNTER — Encounter (HOSPITAL_COMMUNITY): Payer: Medicare Other

## 2016-05-08 ENCOUNTER — Other Ambulatory Visit: Payer: Self-pay | Admitting: Pediatrics

## 2016-05-08 ENCOUNTER — Other Ambulatory Visit: Payer: Self-pay | Admitting: Family

## 2016-05-08 DIAGNOSIS — G8929 Other chronic pain: Secondary | ICD-10-CM

## 2016-05-08 DIAGNOSIS — H65111 Acute and subacute allergic otitis media (mucoid) (sanguinous) (serous), right ear: Secondary | ICD-10-CM

## 2016-05-11 ENCOUNTER — Telehealth (HOSPITAL_COMMUNITY): Payer: Self-pay | Admitting: Pediatrics

## 2016-05-11 NOTE — Telephone Encounter (Signed)
05/11/16 I called to cx the SP appt and reminded them to just come to the 1:45 OT appt.  Caryl Pina was scik

## 2016-05-12 ENCOUNTER — Ambulatory Visit (HOSPITAL_COMMUNITY): Payer: Medicare Other | Admitting: Occupational Therapy

## 2016-05-12 ENCOUNTER — Ambulatory Visit (HOSPITAL_COMMUNITY): Payer: Medicare Other

## 2016-05-12 ENCOUNTER — Encounter (HOSPITAL_COMMUNITY): Payer: Self-pay | Admitting: Occupational Therapy

## 2016-05-12 ENCOUNTER — Ambulatory Visit (INDEPENDENT_AMBULATORY_CARE_PROVIDER_SITE_OTHER): Payer: Medicare Other

## 2016-05-12 DIAGNOSIS — R278 Other lack of coordination: Secondary | ICD-10-CM | POA: Diagnosis not present

## 2016-05-12 DIAGNOSIS — R41841 Cognitive communication deficit: Secondary | ICD-10-CM | POA: Diagnosis not present

## 2016-05-12 DIAGNOSIS — R2681 Unsteadiness on feet: Secondary | ICD-10-CM | POA: Diagnosis not present

## 2016-05-12 DIAGNOSIS — R296 Repeated falls: Secondary | ICD-10-CM | POA: Diagnosis not present

## 2016-05-12 DIAGNOSIS — I69318 Other symptoms and signs involving cognitive functions following cerebral infarction: Secondary | ICD-10-CM

## 2016-05-12 DIAGNOSIS — I6322 Cerebral infarction due to unspecified occlusion or stenosis of basilar arteries: Secondary | ICD-10-CM | POA: Diagnosis not present

## 2016-05-12 DIAGNOSIS — M6281 Muscle weakness (generalized): Secondary | ICD-10-CM | POA: Diagnosis not present

## 2016-05-12 DIAGNOSIS — Z780 Asymptomatic menopausal state: Secondary | ICD-10-CM

## 2016-05-12 NOTE — Therapy (Addendum)
[place], [place], [address] Phone: [phone]   Fax:  [phone]  Occupational Therapy Evaluation  Patient Details  Name: Susan Patel MRN: [id number] Date of Birth: 07-19-1940 Referring Provider: Dr. Alysia Penna  Encounter Date: 05/12/2016      OT End of Session - 05/12/16 1610    Visit Number 1   Number of Visits 4   Date for OT Re-Evaluation 06/11/16   Authorization Type Medicare A & B   Authorization Time Period Before 10th visit   Authorization - Visit Number 1   Authorization - Number of Visits 10   OT Start Time [id number]   OT Stop Time 1425   OT Time Calculation (min) 40 min   Activity Tolerance Patient tolerated treatment well   Behavior During Therapy [facility] for tasks assessed/performed      Past Medical History:  Diagnosis Date  . Arthritis    'all over"  . Asthma   . COPD (chronic obstructive pulmonary disease) (HCC)    O2 per [place] at nights   . Depression   . Diabetes mellitus without complication ([place])    borderline- , states she was on med., but MD told her "everything is under control so I threw the bottle away"  . Fibromyalgia   . GERD (gastroesophageal reflux disease)   . Hypertension   . Neuromuscular disorder (HCC)    parkinson, neuropathy- both feet & hands.    Past Surgical History:  Procedure Laterality Date  . ABDOMINAL HYSTERECTOMY    . ANTERIOR CERVICAL DECOMP/DISCECTOMY FUSION N/A 07/10/2015   Procedure: Cervical five-six, Cervical six-seven anterior cervical decompression with fusion plating and bonegraft;  Surgeon: Jovita Gamma, MD;  Location: [facility];  Service: Neurosurgery;  Laterality: N/A;  . APPENDECTOMY    . BIOPSY EYE MUSCLE  03/20/2016   biopsy vessel to right eye due to swelling  . EYE SURGERY Bilateral    cataracts removed, /w "cyrstal lenses"   . SHOULDER ARTHROSCOPY Right    x2   RCR- spurs removed     There were no vitals filed for this  visit.      Subjective Assessment - 05/12/16 1355    Subjective  S: I am having trouble with writing.    Pertinent History Pt is a 75 y/o female s/p left MCA CVA on 03/20/16. Pt received OT/PT/SLP at [facility] from 03/23/16-03/31/16, no HH services post discharge. Pt was referred to occupational therapy for evaluation and treatment by Dr. Alysia Penna.    Limitations Fall risk-pt reports 3 falls in one week recently   Patient Stated Goals To have better handwriting   Currently in Pain? No/denies           [facility] OT Assessment - 05/12/16 1343      Assessment   Diagnosis Left MCA CVA   Referring Provider Dr. Alysia Penna   Onset Date 03/20/16   Prior Therapy [facility] 11/7-11/15/17     Precautions   Precautions Fall     Balance Screen   Has the patient fallen in the past 6 months Yes  hit head once   How many times? 3  in past week   Has the patient had a decrease in activity level because of a fear of falling?  No   Is the patient reluctant to leave their home because of a fear of falling?  No     Home  Environment   Family/patient expects to be discharged  to: Private residence   Armed forces logistics/support/administrative officer other   Available Help at Discharge Family   Type of [place] With Spouse  husband     Prior Function   Level of Independence Independent   Vocation Retired   Leisure reading, playing piano, keyboard     ADL   ADL comments Pt is having difficulty with writing, playing piano, fine motor tasks/in-hand manipulation     Written Expression   Dominant Hand Right     Cognition   Area of Impairment Memory;Problem solving   Memory Decreased short-term memory   Problem Solving Slow processing   Memory Impaired   Memory Impairment Decreased recall of new information;Decreased short term memory   Decreased Short Term Memory Functional basic   Problem Solving Impaired   Problem Solving Impairment Functional basic   Executive Function Reasoning    Reasoning Impaired     Sensation   Light Touch Appears Intact     Coordination   Gross Motor Movements are Fluid and Coordinated Yes   Fine Motor Movements are Fluid and Coordinated Yes   9 Hole Peg Test Right;Left   Right 9 Hole Peg Test 38.68"   Left 9 Hole Peg Test 33.98"     ROM / Strength   AROM / PROM / Strength Strength     Strength   Strength Assessment Site Shoulder;Elbow;Forearm;Hand;Wrist   Right/Left Shoulder Right   Right Shoulder Flexion 4+/5   Right Shoulder ABduction 4+/5   Right Shoulder Internal Rotation 4+/5   Right Shoulder External Rotation 4+/5   Right/Left Elbow Right   Right Elbow Flexion 4+/5   Right Elbow Extension 4+/5   Right/Left Forearm Right   Right Forearm Pronation 4+/5   Right Forearm Supination 4+/5   Right/Left Wrist Right   Right Wrist Flexion 4+/5   Right Wrist Extension 4+/5   Right Wrist Radial Deviation 4+/5   Right Wrist Ulnar Deviation 4+/5   Right/Left hand Right;Left   Right Hand Gross Grasp Functional   Right Hand Grip (lbs) 25   Right Hand Lateral Pinch 12 lbs   Right Hand 3 Point Pinch 9 lbs   Left Hand Gross Grasp Functional   Left Hand Grip (lbs) 30   Left Hand Lateral Pinch 10 lbs   Left Hand 3 Point Pinch 10 lbs                         OT Education - 05/12/16 1622    Education provided Yes   Education Details Provided fine motor HEP (see scan). Provided lined paper with instructions to copy 1 to 2 sentences working to fill [employer]) Educated Patient   Methods Explanation;Demonstration;Handout   Comprehension Verbalized understanding;Returned demonstration          OT Short Term Goals - 05/12/16 1614      OT SHORT TERM GOAL #1   Title Pt will be educated on and independent in HEP to improve fine motor and handwriting skills at home.    Time 4   Period Weeks   Status New     OT SHORT TERM GOAL #2   Title Pt will improve right fine motor coordination to increase ability to play  piano by completing 9 hole peg test in under 35 seconds.    Time 4   Period Weeks   Status New     OT SHORT TERM GOAL #3   Title Pt will  improve handwriting legibility with RUE by 25% to improve ability to complete IADL tasks independently.    Time 4   Period Weeks   Status New     OT SHORT TERM GOAL #4   Title Pt will return to highest level of functioning using RUE as dominant during ADL and leisure task completion.    Time 4   Period Weeks   Status New                  Plan - 05/12/16 1611    Clinical Impression Statement A: Pt is a 75 y/o female s/p left CVA on 03/20/16 presenting with increased difficulty with fine motor tasks, main concern is handwriting. Pt reports she would also like to attempt to play piano again, has not played since stroke. Pt provided with fine motor exercises and lined paper with instructions for handwriting practice for HEP.    Rehab Potential Good   OT Frequency 1x / week   OT Duration 4 weeks   OT Treatment/Interventions Self-care/ADL training;Therapeutic exercise;Patient/family education;Therapeutic activities   Plan P: Pt will benefit from skilled OT services to increased fine motor coordination, improve handwriting skills, and increase in-hand manipulation skills to promote improve functional task performance using RUE as dominant. Treatment plan: fine motor coordination activities, in-hand manipuation, handwriting, grip and pinch strengthening as needed.   OT Home Exercise Plan 12/27: lined paper with handwriting practice instructions, fine motor exercises.    Consulted and Agree with Plan of Care Patient      Patient will benefit from skilled therapeutic intervention in order to improve the following deficits and impairments:  Decreased strength, Impaired UE functional use, Decreased coordination  Visit Diagnosis: Other lack of coordination - Plan: Ot plan of care cert/re-cert  Other symptoms and signs involving cognitive functions  following cerebral infarction - Plan: Ot plan of care cert/re-cert      G-Codes - [id number]    Functional Assessment Tool Used clinical judgement   Functional Limitation Carrying, moving and handling objects   Carrying, Moving and Handling Objects Current Status [id number]) At least 40 percent but less than 60 percent impaired, limited or restricted   Carrying, Moving and Handling Objects Goal Status [id number]) At least 20 percent but less than 40 percent impaired, limited or restricted      Problem List Patient Active Problem List   Diagnosis Date Noted  . 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 Frequent falls 04/21/2016  . 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 Type 2 diabetes mellitus with peripheral neuropathy (HCC)   . Fibromyalgia   . Chronic pain syndrome   . Chronic obstructive pulmonary disease ([place])   . Gait disturbance, post-stroke   . Dysarthria, post-stroke   . Dysphagia, post-stroke   . AKI (acute kidney injury) ([place])   . Acute blood loss anemia   . 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 Mixed hyperlipidemia   . 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  . Allergic rhinitis 12/17/2015  . Primary osteoarthritis of both knees 11/19/2015  . Obesity (BMI 30-39.9) 09/04/2015  . Essential hypertension 09/04/2015  . Constipation 09/04/2015  . Asthma 08/26/2015  . Raynaud's phenomenon 08/26/2015  . Gastro-esophageal reflux disease with esophagitis 08/26/2015  . Spinal stenosis 08/26/2015  . Vitamin D deficiency 08/26/2015  . Diabetes mellitus, type 2 ([place]) 08/22/2015  . HNP (herniated nucleus pulposus), cervical 07/10/2015  . Cervical  radicular pain 04/30/2013  . Knee osteoarthritis 01/05/2012   Guadelupe Sabin, OTR/L  [phone] 05/12/2016, 4:22 PM  [facility] [address] [place], [place], [address] Phone: [phone]    Fax:  [phone]  Name: JAHLIYA OSHINSKI MRN: [id number] Date of Birth: 1941-02-04

## 2016-05-12 NOTE — Telephone Encounter (Signed)
Pt scheduled DEXA for 12/*27/2017 at 3:30pm

## 2016-05-13 ENCOUNTER — Ambulatory Visit (INDEPENDENT_AMBULATORY_CARE_PROVIDER_SITE_OTHER): Payer: Medicare Other | Admitting: Pediatrics

## 2016-05-13 ENCOUNTER — Encounter: Payer: Self-pay | Admitting: Pediatrics

## 2016-05-13 ENCOUNTER — Telehealth (HOSPITAL_COMMUNITY): Payer: Self-pay | Admitting: Pediatrics

## 2016-05-13 ENCOUNTER — Ambulatory Visit: Payer: Medicare Other | Admitting: Pediatrics

## 2016-05-13 VITALS — BP 184/88 | HR 75 | Temp 97.0°F | Ht 61.0 in | Wt 179.8 lb

## 2016-05-13 DIAGNOSIS — L309 Dermatitis, unspecified: Secondary | ICD-10-CM | POA: Diagnosis not present

## 2016-05-13 DIAGNOSIS — G8929 Other chronic pain: Secondary | ICD-10-CM

## 2016-05-13 DIAGNOSIS — I10 Essential (primary) hypertension: Secondary | ICD-10-CM | POA: Diagnosis not present

## 2016-05-13 DIAGNOSIS — J329 Chronic sinusitis, unspecified: Secondary | ICD-10-CM | POA: Diagnosis not present

## 2016-05-13 DIAGNOSIS — I6930 Unspecified sequelae of cerebral infarction: Secondary | ICD-10-CM | POA: Diagnosis not present

## 2016-05-13 MED ORDER — AMOXICILLIN 500 MG PO CAPS: 500.0000 mg | ORAL_CAPSULE | Freq: Two times a day (BID) | ORAL | 0 refills | Status: DC

## 2016-05-13 MED ORDER — HYDROCHLOROTHIAZIDE 12.5 MG PO TABS: 25.0000 mg | ORAL_TABLET | Freq: Every day | ORAL | 0 refills | Status: DC

## 2016-05-13 MED ORDER — LOSARTAN POTASSIUM 100 MG PO TABS: 100.0000 mg | ORAL_TABLET | Freq: Every day | ORAL | 1 refills | Status: DC

## 2016-05-13 NOTE — Patient Instructions (Addendum)
Increase losartan to 100mg  daily Start Hydrochlorothiazide 12.5mg  daily Check at home Call me if remains >150 after 5 days  Start amoxicillin 500mg  twice a day for 1 week

## 2016-05-13 NOTE — Progress Notes (Signed)
  Subjective:   Patient ID: Susan Patel, female    DOB: 1941/04/26, 75 y.o.   MRN: [id number] CC: Cough and Nasal Congestion  HPI: Susan Patel is a 75 y.o. female presenting for Cough and Nasal Congestion  Started getting sick 4-5 days ago Coughing Minimally productive Subjective fevers Lousy appetite Got better after last illness, now new symptoms Coughing some  BP has remained high at home with restart of losartan No CP, HA, SOB  Tramadol is helping with pain  On ASA 325mg  for recent CVA Residual dysarthria, in speech therapy  Has itching areas on flexor surface L wrist, has been scratching  Relevant past medical, surgical, family and social history reviewed. Allergies and medications reviewed and updated. History  Smoking Status  . Never Smoker  Smokeless Tobacco  . Never Used   ROS: Per HPI   Objective:    BP (!) 184/88   Pulse 75   Temp 97 F (36.1 C)   Ht 5\' 1"  (1.549 m)   Wt 179 lb 12.8 oz (81.6 kg)   BMI 33.97 kg/m   Wt Readings from Last 3 Encounters:  05/13/16 179 lb 12.8 oz (81.6 kg)  04/21/16 184 lb (83.5 kg)  03/31/16 185 lb 9.6 oz (84.2 kg)     Gen: NAD, alert, cooperative with exam, NCAT, congested EYES: EOMI, no conjunctival injection, or no icterus ENT:  TMs dull pink b/l, small effusion, OP without erythema LYMPH: no cervical LAD CV: NRRR, normal S1/S2 Resp: CTABL, no wheezes, normal WOB Abd: +BS, soft, NTND. no guarding or organomegaly Ext: No edema, warm Neuro: Alert MSK: normal muscle bulk Skin: red papules with excorations L wrist flexor surface, no surrounding erythema  Assessment & Plan:  Susan Patel was seen today for cough and nasal congestion and follow up med problems.  Diagnoses and all orders for this visit:  Sinusitis, unspecified chronicity, unspecified location Discussed sx care, start below -     amoxicillin (AMOXIL) 500 MG capsule; Take 1 capsule (500 mg total) by mouth 2 (two) times daily.  Essential  hypertension Remains elevated, asymptomatic, increase losartan, if still elevated start HCTZ Let me knwo if remains >150 SBP -     losartan (COZAAR) 100 MG tablet; Take 1 tablet (100 mg total) by mouth daily. -     hydrochlorothiazide (HYDRODIURIL) 12.5 MG tablet; Take 2 tablets (25 mg total) by mouth daily.  Eczema, unspecified type Use on wrist, if not improving let me know -     triamcinolone (KENALOG) 0.025 % ointment; Apply 1 application topically 2 (two) times daily.  Other chronic pain Takes once a day Improves ability to perform ADLs and quality of life Needs pain contract, u tox per clinic policy next visit -     traMADol (ULTRAM) 50 MG tablet; Take 0.5-1 tablets (25-50 mg total) by mouth daily as needed for moderate pain or severe pain.   Follow up plan: 2-4 weeks for pain contract appt Assunta Found, MD [place]

## 2016-05-13 NOTE — Telephone Encounter (Signed)
05/13/16 called and spoke to husband to let him know that the 12/29 SP appt was cx because Caryl Pina is sick

## 2016-05-14 ENCOUNTER — Telehealth: Payer: Self-pay | Admitting: Pediatrics

## 2016-05-14 ENCOUNTER — Ambulatory Visit (HOSPITAL_COMMUNITY): Payer: Medicare Other

## 2016-05-14 MED ORDER — TRAMADOL HCL 50 MG PO TABS: 25.0000 mg | ORAL_TABLET | Freq: Every day | ORAL | 0 refills | Status: DC | PRN

## 2016-05-14 MED ORDER — TRIAMCINOLONE ACETONIDE 0.025 % EX OINT: 1.0000 "application " | TOPICAL_OINTMENT | Freq: Two times a day (BID) | CUTANEOUS | 0 refills | Status: DC

## 2016-05-14 MED ORDER — MONTELUKAST SODIUM 10 MG PO TABS: 10.0000 mg | ORAL_TABLET | Freq: Every day | ORAL | 3 refills | Status: DC

## 2016-05-14 MED ORDER — ESOMEPRAZOLE MAGNESIUM 20 MG PO CPDR: 20.0000 mg | DELAYED_RELEASE_CAPSULE | Freq: Every day | ORAL | 1 refills | Status: DC

## 2016-05-14 MED ORDER — ASPIRIN 325 MG PO TBEC: 325.0000 mg | DELAYED_RELEASE_TABLET | Freq: Every day | ORAL | 1 refills | Status: DC

## 2016-05-14 NOTE — Telephone Encounter (Signed)
Called back, talked with husband.

## 2016-05-14 NOTE — Telephone Encounter (Signed)
Patient was seen yesterday by Evette Doffing. Husband states patient needs refills which are pending, a refill on Tramadol and also was told a rx was going to be called in for patients insect bites on her arms. To help with itching. Please advise and send back to pools.

## 2016-05-19 ENCOUNTER — Telehealth (HOSPITAL_COMMUNITY): Payer: Self-pay

## 2016-05-19 ENCOUNTER — Ambulatory Visit (HOSPITAL_COMMUNITY): Payer: Medicare Other

## 2016-05-19 NOTE — Telephone Encounter (Signed)
husband called she is sick and  will not be here today

## 2016-05-20 ENCOUNTER — Telehealth: Payer: Self-pay | Admitting: Pediatrics

## 2016-05-20 ENCOUNTER — Ambulatory Visit: Payer: Medicare Other | Admitting: Pediatrics

## 2016-05-20 MED ORDER — AMOXICILLIN-POT CLAVULANATE 875-125 MG PO TABS: 1.0000 | ORAL_TABLET | Freq: Two times a day (BID) | ORAL | 0 refills | Status: DC

## 2016-05-20 NOTE — Telephone Encounter (Signed)
No improvement, sending in augmentinx7 days

## 2016-05-21 ENCOUNTER — Ambulatory Visit (HOSPITAL_COMMUNITY): Payer: Medicare Other

## 2016-05-21 ENCOUNTER — Telehealth (HOSPITAL_COMMUNITY): Payer: Self-pay

## 2016-05-21 NOTE — Telephone Encounter (Signed)
Patient's husband called and canceled her appt ,she have the flu.

## 2016-05-21 NOTE — Telephone Encounter (Signed)
Informative phone call:  Completed - pt still sick, calledto educate husband about possibility of d/cduring visit next week d/t max progress, he is in agreement , [place] but a family member will bring her to therapy    By Luther Redo, [place]     Thank you,   Renato Gails. Megan Salon, MS, CCC-SLP  Speech-Language Pathologist [phone]

## 2016-05-24 ENCOUNTER — Encounter (HOSPITAL_COMMUNITY): Payer: Self-pay

## 2016-05-24 ENCOUNTER — Other Ambulatory Visit: Payer: Self-pay

## 2016-05-24 ENCOUNTER — Ambulatory Visit (HOSPITAL_COMMUNITY): Payer: Medicare Other

## 2016-05-24 ENCOUNTER — Ambulatory Visit (HOSPITAL_COMMUNITY): Payer: Medicare Other | Admitting: Physical Therapy

## 2016-05-24 DIAGNOSIS — I10 Essential (primary) hypertension: Secondary | ICD-10-CM

## 2016-05-24 MED ORDER — LOSARTAN POTASSIUM 100 MG PO TABS: 100.0000 mg | ORAL_TABLET | Freq: Every day | ORAL | 0 refills | Status: DC

## 2016-05-24 NOTE — Telephone Encounter (Signed)
05/24/16 husband called to cx - he said that she was still scik

## 2016-05-25 ENCOUNTER — Ambulatory Visit: Payer: Medicare Other | Admitting: Nurse Practitioner

## 2016-05-26 ENCOUNTER — Ambulatory Visit (HOSPITAL_COMMUNITY): Payer: Medicare Other | Admitting: Occupational Therapy

## 2016-05-26 ENCOUNTER — Telehealth (HOSPITAL_COMMUNITY): Payer: Self-pay | Admitting: Occupational Therapy

## 2016-05-26 ENCOUNTER — Ambulatory Visit (HOSPITAL_COMMUNITY): Payer: Medicare Other

## 2016-05-26 NOTE — Telephone Encounter (Signed)
Dont feel up to coming today.

## 2016-05-31 ENCOUNTER — Ambulatory Visit (HOSPITAL_COMMUNITY): Payer: Medicare Other | Attending: Physical Medicine and Rehabilitation

## 2016-05-31 ENCOUNTER — Encounter (HOSPITAL_COMMUNITY): Payer: Self-pay

## 2016-05-31 DIAGNOSIS — M6281 Muscle weakness (generalized): Secondary | ICD-10-CM | POA: Diagnosis present

## 2016-05-31 DIAGNOSIS — R278 Other lack of coordination: Secondary | ICD-10-CM | POA: Diagnosis present

## 2016-05-31 DIAGNOSIS — R296 Repeated falls: Secondary | ICD-10-CM | POA: Diagnosis present

## 2016-05-31 DIAGNOSIS — R2681 Unsteadiness on feet: Secondary | ICD-10-CM | POA: Diagnosis present

## 2016-05-31 DIAGNOSIS — I69318 Other symptoms and signs involving cognitive functions following cerebral infarction: Secondary | ICD-10-CM | POA: Diagnosis present

## 2016-05-31 DIAGNOSIS — R41841 Cognitive communication deficit: Secondary | ICD-10-CM | POA: Insufficient documentation

## 2016-05-31 NOTE — Therapy (Signed)
[facility] [place], [place], [address] Phone: [phone]   Fax:  [phone]  Speech Language Pathology Treatment  Patient Details  Name: Susan Patel MRN: [id number] Date of Birth: 20-Dec-1940 Referring Provider: Dr. Alysia Penna   Encounter Date: 05/31/2016      End of Session - 05/31/16 1417    Visit Number 6   Number of Visits 17   Date for SLP Re-Evaluation 06/02/15   Authorization Type medicare part A - primary; [employer] state health -secondary    SLP Start Time 30   SLP Stop Time  1340   SLP Time Calculation (min) 40 min   Activity Tolerance Patient tolerated treatment well      Past Medical History:  Diagnosis Date  . Arthritis    'all over"  . Asthma   . COPD (chronic obstructive pulmonary disease) (HCC)    O2 per [place] at nights   . Depression   . Diabetes mellitus without complication ([place])    borderline- , states she was on med., but MD told her "everything is under control so I threw the bottle away"  . Fibromyalgia   . GERD (gastroesophageal reflux disease)   . Hypertension   . Neuromuscular disorder (HCC)    parkinson, neuropathy- both feet & hands.    Past Surgical History:  Procedure Laterality Date  . ABDOMINAL HYSTERECTOMY    . ANTERIOR CERVICAL DECOMP/DISCECTOMY FUSION N/A 07/10/2015   Procedure: Cervical five-six, Cervical six-seven anterior cervical decompression with fusion plating and bonegraft;  Surgeon: Jovita Gamma, MD;  Location: [facility];  Service: Neurosurgery;  Laterality: N/A;  . APPENDECTOMY    . BIOPSY EYE MUSCLE  03/20/2016   biopsy vessel to right eye due to swelling  . EYE SURGERY Bilateral    cataracts removed, /w "cyrstal lenses"   . SHOULDER ARTHROSCOPY Right    x2   RCR- spurs removed     There were no vitals filed for this visit.      Subjective Assessment - 05/31/16 1413    Subjective "I'm still feeling down after having bronchitis"    Currently in  Pain? No/denies   Pain Score 0-No pain   Multiple Pain Sites No               ADULT SLP TREATMENT - 05/31/16 0001      General Information   Behavior/Cognition Alert   Patient Positioning Upright in chair   HPI 76 y/o female with recent CVA (Novemver 2017) dx of arthritis, asthma, COPD, oxygen at night, depression, DM, fibromyalgia, GERD, HTN, and parkinson neuropathy. Recent MBSS (03/29/16) recommends regtular solids and thin liquisd with use of aspiration precautions and no mixed consistency as well as whole meds in puree. During oral mech exam, noted decreased initiation of swallowing, weak airway protection, and decreased lingual ROM and coordination. No overt s/s of aspiration were noted with crackers and thin water via cup sip today. Educated pt. and husband about safe swallowing precautions and provided handout. Administered the [facility] with pt. and she scored a 22/30 with deficits noted in executive functioning, category naming, auditory comprehensive and recall. Pt. also presents with moderate dysarthria , with approximately 60% speech intelligibility in short phrases. Patient's main goal is to improve her speech. She presents with impairments in language, cognition, and swallowing and would benefit for ST intervention .      Treatment Provided   Treatment provided Cognitive-Linquistic     Dysphagia Treatment  Temperature Spikes Noted No   Treatment Methods Skilled observation;Compensation strategy training;Patient/caregiver education   Type of PO's observed Thin liquids   Feeding Able to feed self   Liquids provided via Cup;Straw     Pain Assessment   Pain Assessment No/denies pain     Cognitive-Linquistic Treatment   Treatment focused on Cognition   Skilled Treatment spaced retrival, assessed compensatory strategy for speech production     Assessment / Recommendations / Plan   Plan Discharge SLP treatment due to (comment)  long term goals met     Dysphagia  Recommendations   Liquids provided via Cup;Straw     Progression Toward Goals   Progression toward goals Goals met, education completed, patient discharged from SLP  all long term goals met          SLP Education - 05/31/16 1415    Education provided Yes   Education Details educted pt. about strategies to make gains with speech intelligibility  and strategies to decrease risk of aspiration   Person(s) Educated Patient   Methods Explanation   Comprehension Verbalized understanding;Returned demonstration          SLP Short Term Goals - 05/31/16 1439      SLP SHORT TERM GOAL #1   Title Pt. will name items up to 12 items in a given category with accuracy in 8/10 trials with min assistance.    Baseline names 2 items in a category    Time 8   Period Weeks   Status Achieved     SLP SHORT TERM GOAL #2   Title Pt. will complete recall of information for completion of functional daily tasks with 90% accuracy with no assistance over 5 sessions.    Baseline 70% accuracy    Time 8   Period Weeks   Status Achieved      SLP SHORT TERM GOAL #3   Title Pt. will complete oral motor exercises, pharyngeal strengthening, and lingual ROM exercises with accuracy in 8/10 trials given minimal feedback.    Baseline 50% accuracy    Time 8   Period Weeks   Status Deferred     SLP SHORT TERM GOAL #4   Title Pt. will demonstrate use and understanding of safe swallowing strategies and aspiration precautions with accuracy in 9/10 trials across 3 sessions with no assistance.    Baseline 70% accuracy    Time 8   Period Weeks   Status Deferred     SLP SHORT TERM GOAL #5   Title Pt. will use compensatory strategies for improved speech intelligiblity with min verbal cues in 9/10 trials.    Baseline 50%   Time 8   Period Weeks   Status Achieved          SLP Long Term Goals - 05/31/16 1446      SLP LONG TERM GOAL #1   Title Pt. will increase speech intelligibility to 85% during  conversational exchanges with no assistance.    Baseline 60% accuracy    Time 8   Period Weeks   Status Achieved     SLP LONG TERM GOAL #2   Title Pt. will complete expressive language tasks with 85% accuracy and no assistance.    Baseline 20% accuracy    Time 8   Period Weeks   Status Achieved     SLP LONG TERM GOAL #3   Title Pt. will safelty swallow least restrictive diet with no s/s of aspiration in 9/10 trials and no cues.  Baseline 50% accuracy    Time 8   Period Weeks   Status Achieved          Plan - 06/26/16 1418    Clinical Impression Statement Pt. was cooperative and attentive throughout visit. She states that her friends and family members are able to understand her with no difficulty. She also reports no coughing/choking episodes at home. She consumed thin liquids via straw sip with no overt s/s of aspiration. She completed spaced retrieval task with 100% accuracy in 5/5 trials today. She named an average of 12 words in a given category in 9/10 trials today. She recalled strategies for improving speech intelligibility with min verbal cueing. Speech intelligibility was 85%-95% today during unstructured conversation. Pt has made several gains with speech, cognition, and swallowing over the course of treatment and she has met several ST goals (see goals section), please note that some goals have been deferred due to the patient's ability to now safely swallow solids and liquids (no need to further target these goals). Educated pt about recommendation to discharge from [facility], she agrees and she  does not have questions about discharge.    Speech Therapy Frequency 2x / week   Duration Other (comment)  8 weeks    Treatment/Interventions Aspiration precaution training;Diet toleration management by SLP;Compensatory strategies;Cognitive reorganization;Trials of upgraded texture/liquids;Patient/family education;Compensatory techniques   Potential to Achieve Goals Good   SLP Home  Exercise Plan although pt is being discharged today, SLP encouraged her to remain social and continue to complete activities to target recall (word puzzles, read newspaper)    Consulted and Agree with Plan of Care Patient;Family member/caregiver      Patient will benefit from skilled therapeutic intervention in order to improve the following deficits and impairments:   Cognitive communication deficit      G-Codes - Jun 26, 2016 1542    Functional Assessment Tool Used skilled observation    Functional Limitations Spoken language expressive   Spoken Language Expression Goal Status [id number]) At least 20 percent but less than 40 percent impaired, limited or restricted   Spoken Language Expression Discharge Status [phone]) At least 1 percent but less than 20 percent impaired, limited or restricted      Problem List Patient Active Problem List   Diagnosis Date Noted  . 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 Frequent falls 04/21/2016  . 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 Type 2 diabetes mellitus with peripheral neuropathy (HCC)   . Fibromyalgia   . Chronic pain syndrome   . Chronic obstructive pulmonary disease ([place])   . Gait disturbance, post-stroke   . Dysarthria, post-stroke   . Dysphagia, post-stroke   . AKI (acute kidney injury) ([place])   . Acute blood loss anemia   . 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 Mixed hyperlipidemia   . 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  . Allergic rhinitis 12/17/2015  . Primary osteoarthritis of both knees 11/19/2015  . Obesity (BMI 30-39.9) 09/04/2015  . Essential hypertension 09/04/2015  . Constipation 09/04/2015  . Asthma 08/26/2015  . Raynaud's phenomenon 08/26/2015  . Gastro-esophageal reflux disease with esophagitis 08/26/2015  . Spinal stenosis 08/26/2015  . Vitamin D deficiency 08/26/2015  . Diabetes mellitus, type 2 ([place]) 08/22/2015  .  HNP (herniated nucleus pulposus), cervical 07/10/2015  . Cervical radicular pain 04/30/2013  . Knee osteoarthritis 01/05/2012    SPEECH THERAPY DISCHARGE SUMMARY  Visits from [facility]  of Care: 6  Current functional level related to goals / functional outcomes: Pt speech intelligibility is [facility], naming ability is [facility], recall is [facility] , swallowing ability is [facility]   Remaining deficits:  Pt has speech difficulty at times but she uses strategies to improve speech intelligibility.     Education / Equipment: Handouts have been provided over the course of treatment to target safe swallowing strategies and handout for swallowing exercises  Plan: Patient agrees to discharge.  Patient goals were met. Patient is being discharged due to being pleased with the current functional level.  ?????      Thank you,   Renato Gails. Megan Salon [place], CCC-SLP  Speech-Language Pathologist [phone]     Luther Redo 05/31/2016, 3:44 PM  [facility] [address] [place], [place], [address] Phone: [phone]   Fax:  [phone]   Name: DOLLIE BRESSI MRN: [id number] Date of Birth: [birth year]

## 2016-05-31 NOTE — Therapy (Deleted)
[facility] [address] [place], [place], [address] Phone: [phone]   Fax:  [phone]  May 31, 2016     Speech Language Pathology Therapy Discharge Summary   Patient: Susan Patel  MRN: [id number]  Date of Birth: July 21, 1940   Diagnosis: Cognitive communication deficit Referring Provider: Dr. Alysia Penna   The above patient had been seen in Speech Language Pathology times of 6 treatments scheduled with SLP, 0 no shows and 4 cancellations due to illness, and 2 clinic cancellations (due to SLP illness).  The treatment consisted of dysphagia therapy to target safe swallowing of regular consistency solids and thin liquids, eduction regarding safe swallowing strategies, exercises to strengthen swallowing musculature, recall of information, naming ability, and improved speech intelligibility  The patient is: Improved  Subjective: Pt feels that she is ready for ST discharge, she denies choking episodes at home (consuming regular consistency solids and thin liquids) and she states that friends and family are able to understand her.   Discharge Findings: pt demonstrates gains with cognition, communication, and ability to safely swallow solids and liquids  Functional Status at Discharge: regular consistency solids, thin liquids; functional and effective communication, improved recall of information  All Goals Met, all long term goals met       Plan - 05/31/16 1418    Clinical Impression Statement Pt. was cooperative and attentive throughout visit. She states that her friends and family members are able to understand her with no difficulty. She also reports no coughing/choking episodes at home. She consumed thin liquids via straw sip with no overt s/s of aspiration. She completed spaced retrieval task with 100% accuracy in 5/5 trials today. She named an average of 12 words in a given category in 9/10 trials today. She recalled  strategies for improving speech intelligibility with min verbal cueing. Speech intelligibility was 85%-95% today during unstructured conversation. Pt has made several gains with speech, cognition, and swallowing over the course of treatment and she has met several ST goals (see goals section), please note that some goals have been deferred due to the patient's ability to now safely swallow solids and liquids (no need to further target these goals). Educated pt about recommendation to discharge from [facility], she agrees and she  does not have questions about discharge.    Speech Therapy Frequency 2x / week   Duration Other (comment)  8 weeks    Treatment/Interventions Aspiration precaution training;Diet toleration management by SLP;Compensatory strategies;Cognitive reorganization;Trials of upgraded texture/liquids;Patient/family education;Compensatory techniques   Potential to Achieve Goals Good   SLP Home Exercise Plan although pt is being discharged today, SLP encouraged her to remain social and continue to complete activities to target recall (word puzzles, read newspaper)    Consulted and Agree with Plan of Care Patient;Family member/caregiver         Sincerely,   Luther Redo, CCC-SLP    Thank you,   Renato Gails. Megan Salon [place], CCC-SLP  Speech-Language Pathologist [phone]      [facility] [address] [place], [place], [address] Phone: [phone]   Fax:  [phone]

## 2016-06-02 ENCOUNTER — Encounter (HOSPITAL_COMMUNITY): Payer: Medicare Other

## 2016-06-02 ENCOUNTER — Ambulatory Visit (HOSPITAL_COMMUNITY): Payer: Medicare Other | Admitting: Occupational Therapy

## 2016-06-02 ENCOUNTER — Other Ambulatory Visit: Payer: Self-pay | Admitting: Family

## 2016-06-02 ENCOUNTER — Encounter (HOSPITAL_COMMUNITY): Payer: Medicare Other | Admitting: Occupational Therapy

## 2016-06-04 NOTE — Telephone Encounter (Signed)
She doesn't need it, already taking nexium.

## 2016-06-04 NOTE — Telephone Encounter (Signed)
I do not see pantoprazole on patients list. Please advise

## 2016-06-06 ENCOUNTER — Other Ambulatory Visit: Payer: Self-pay | Admitting: Pediatrics

## 2016-06-06 DIAGNOSIS — I10 Essential (primary) hypertension: Secondary | ICD-10-CM

## 2016-06-07 ENCOUNTER — Encounter (HOSPITAL_COMMUNITY): Payer: Medicare Other

## 2016-06-09 ENCOUNTER — Ambulatory Visit (HOSPITAL_COMMUNITY): Payer: Medicare Other | Admitting: Occupational Therapy

## 2016-06-09 ENCOUNTER — Ambulatory Visit (HOSPITAL_COMMUNITY): Payer: Medicare Other | Admitting: Physical Therapy

## 2016-06-09 ENCOUNTER — Encounter (HOSPITAL_COMMUNITY): Payer: Medicare Other

## 2016-06-09 ENCOUNTER — Encounter (HOSPITAL_COMMUNITY): Payer: Medicare Other | Admitting: Occupational Therapy

## 2016-06-09 ENCOUNTER — Encounter (HOSPITAL_COMMUNITY): Payer: Self-pay | Admitting: Occupational Therapy

## 2016-06-09 ENCOUNTER — Other Ambulatory Visit: Payer: Self-pay | Admitting: Pediatrics

## 2016-06-09 DIAGNOSIS — R41841 Cognitive communication deficit: Secondary | ICD-10-CM | POA: Diagnosis not present

## 2016-06-09 DIAGNOSIS — R278 Other lack of coordination: Secondary | ICD-10-CM

## 2016-06-09 DIAGNOSIS — I69318 Other symptoms and signs involving cognitive functions following cerebral infarction: Secondary | ICD-10-CM

## 2016-06-09 DIAGNOSIS — R2681 Unsteadiness on feet: Secondary | ICD-10-CM

## 2016-06-09 DIAGNOSIS — M6281 Muscle weakness (generalized): Secondary | ICD-10-CM

## 2016-06-09 DIAGNOSIS — R296 Repeated falls: Secondary | ICD-10-CM

## 2016-06-09 NOTE — Therapy (Signed)
[place], [place], [address] Phone: [phone]   Fax:  [phone]  Physical Therapy Treatment/Discharge  Patient Details  Name: Susan Patel MRN: [id number] Date of Birth: [birth year] Referring Provider: Alysia Penna, MD  Encounter Date: 06/09/2016      PT End of Session - 06/09/16 1531    Visit Number 2   Number of Visits 16   Date for PT Re-Evaluation 05/28/16   Authorization Type medicare   Authorization - Visit Number 2   Authorization - Number of Visits 10   PT Start Time 3833   PT Stop Time 1513   PT Time Calculation (min) 39 min   Activity Tolerance Patient tolerated treatment well;No increased pain   Behavior During Therapy WFL for tasks assessed/performed      Past Medical History:  Diagnosis Date  . Arthritis    'all over"  . Asthma   . COPD (chronic obstructive pulmonary disease) (HCC)    O2 per [place] at nights   . Depression   . Diabetes mellitus without complication ([place])    borderline- , states she was on med., but MD told her "everything is under control so I threw the bottle away"  . Fibromyalgia   . GERD (gastroesophageal reflux disease)   . Hypertension   . Neuromuscular disorder (HCC)    parkinson, neuropathy- both feet & hands.    Past Surgical History:  Procedure Laterality Date  . ABDOMINAL HYSTERECTOMY    . ANTERIOR CERVICAL DECOMP/DISCECTOMY FUSION N/A 07/10/2015   Procedure: Cervical five-six, Cervical six-seven anterior cervical decompression with fusion plating and bonegraft;  Surgeon: Jovita Gamma, MD;  Location: [facility];  Service: Neurosurgery;  Laterality: N/A;  . APPENDECTOMY    . BIOPSY EYE MUSCLE  03/20/2016   biopsy vessel to right eye due to swelling  . EYE SURGERY Bilateral    cataracts removed, /w "cyrstal lenses"   . SHOULDER ARTHROSCOPY Right    x2   RCR- spurs removed     There were no vitals filed for this visit.      Subjective  Assessment - 06/09/16 1437    Subjective Pt and her husband report that things have improved significantly as far as her mobility is concerned. She has been doing some of the exercises provided by the evaluating therapist. No other complaints other than 1 LOB recently which she was unable to specifically identify. No injury reported with this.   Pertinent History fibromyalgia, DM, HTN,    How long can you sit comfortably? no problem   How long can you stand comfortably? unlimited   How long can you walk comfortably? unlimited    Currently in Pain? No/denies            [facility] PT Assessment - 06/09/16 1440      Assessment   Medical Diagnosis Lt CVA   Referring Provider Lupe Carney, MD   Onset Date/Surgical Date 03/20/16   Hand Dominance Right   Next MD Visit none    Prior Therapy IP     Precautions   Precautions Fall     Restrictions   Weight Bearing Restrictions No     Balance Screen   Has the patient fallen in the past 6 months Yes   How many times? 1   Has the patient had a decrease in activity level because of a fear of falling?  Yes   Is the patient reluctant to leave their home because  of a fear of falling?  Yes     [place]   Type of [place] Access Stairs to enter   Entrance Stairs-Number of Steps 5   Entrance Stairs-Rails Right   [place] to live on main level with bedroom/bathroom   Additional Comments handle is available with this      Prior Function   Level of Independence Independent   Leisure play piano, keyboard      Cognition   Overall Cognitive Status Impaired/Different from baseline   Area of Impairment Memory;Problem solving   Memory Decreased short-term memory   Problem Solving Slow processing   Attention --  WFL    Memory Impaired   Memory Impairment Decreased recall of new information;Decreased short term memory   Decreased Short Term Memory Functional basic   Problem  Solving Impaired   Problem Solving Impairment Functional basic   Executive Function Reasoning   Reasoning Impaired   Behaviors Other (comment)  WFL      Functional Tests   Functional tests Single leg stance;Sit to Stand     Single Leg Stance   Comments Lt:  5-10 sec  Rt: 3-5 seconds      Sit to Stand   Comments weight shifted Rt      Strength   Right Hip Flexion 5/5   Right Hip Extension 4-/5   Right Hip ABduction 4+/5   Left Hip Flexion 4+/5   Left Hip Extension 4-/5   Left Hip ABduction 5/5   Right Knee Flexion 5/5   Right Knee Extension 5/5   Left Knee Flexion 5/5   Left Knee Extension 5/5   Right Ankle Dorsiflexion 5/5   Left Ankle Dorsiflexion 5/5     Bed Mobility   Rolling Right 6: Modified independent (Device/Increase time)   Rolling Left 6: Modified independent (Device/Increase time)   Supine to Sit 3: Mod assist   Sit to Supine 6: Modified independent (Device/Increase time)     Transfers   Five time sit to stand comments  13.9 sec, UE support on thighs      Ambulation/Gait   Ambulation/Gait Yes   Ambulation/Gait Assistance 5: Supervision   Ambulation Distance (Feet) 600 Feet   Assistive device None   Gait Comments 3'      Balance   Balance Assessed Yes     Standardized Balance Assessment   Standardized Balance Assessment Dynamic Gait Index     Dynamic Gait Index   Level Surface Normal   Change in Gait Speed Mild Impairment   Gait with Horizontal Head Turns Normal   Gait with Vertical Head Turns Mild Impairment   Gait and Pivot Turn Normal   Step Over Obstacle Mild Impairment   Step Around Obstacles Mild Impairment   Steps Mild Impairment   Total Score 19                             PT Education - 06/09/16 1530    Education provided Yes   Education Details re-evaluation findings and improvements made in all areas of mobility/balance/etc; additional activities pt can continue to perform at home for further improvements in  function    Person(s) Educated Patient;Spouse   Methods Explanation;Handout   Comprehension Verbalized understanding          PT Short Term Goals - 06/09/16 1502      PT SHORT TERM GOAL #  1   Title Pt single leg stance to be 15 seconds to decrease pt risk of falling    Baseline 5-10 sec    Time 4   Period Weeks   Status Partially Met     PT SHORT TERM GOAL #2   Title Pt hip extension strength bilaterally to be at least 4/5 to allow pt to come sit to stand with ease from lower chairs.    Time 4   Period Weeks   Status Achieved     PT SHORT TERM GOAL #3   Title Pt activity tolerance to be increased to where she is able to be up for two hours without having to rest to complete laundry and light housework    Time 4   Period Weeks   Status Achieved           PT Long Term Goals - 06/09/16 1503      PT LONG TERM GOAL #1   Title Pt to be able to single leg stance for 30 seconds to allow pt to be confident walking on uneven ground   Time 8   Period Weeks   Status Partially Met     PT LONG TERM GOAL #2   Title Pt core strength to be increased one grade to allow pt to be able to complete bed mobilty with ease.    Time 6   Period Weeks   Status Achieved     PT LONG TERM GOAL #3   Title Pt activity tolerance to be increased to allow pt to be up for 3 hours without resting to allow patient to go on outings.    Time 8   Period Weeks   Status Achieved     PT LONG TERM GOAL #4   Title Pt to have had no falls in the past four weeks    Baseline only 1 fall reported since evaluation   Time 8   Period Weeks   Status Partially Met               Plan - 06/09/16 1531    Clinical Impression Statement Mrs. Chervenak was reassessed this visit having made significant progress towards her goals in all areas of strength, balance, mobility and endurance. She was able to maintain SLS on each LE for up to 5-10 sec without LOB, her BLE strength improved to atleast 4/5 MMT, and her  functional testing improved as well. Her DGI score improved from 11 to 19 putting her at a decreased risk of falling out in the community and she and her husband report overall improved activity tolerance. At this time, she could benefit from a few more sessions of skilled PT to further improve her safety with activity, however she feels that she is capable of continuing at home with activity/exercise on her own. Both her and her husband feel comfortable performing her HEP at this point and are in agreement with d/c from PT to allow her to make further progress at home. PT provided an updated HEP to address remaining deficits and there were no further questions.    Rehab Potential Good   PT Frequency 2x / week   PT Duration 8 weeks   PT Treatment/Interventions ADLs/Self Care Home Management;Gait training;Functional mobility training;Therapeutic activities;Therapeutic exercise;Balance training;Neuromuscular re-education;Patient/family education   PT Next Visit Plan begin sit to stand, lunging, narrow base of support with arm motion, tandem stance, progress to tandem gait if tandem stance goes well.  PT Home Exercise Plan heel raises, functional squats and prone SLR, sit to stand, SLS, daily walking    Consulted and Agree with Plan of Care Patient;Family member/caregiver   Family Member Consulted husband       Patient will benefit from skilled therapeutic intervention in order to improve the following deficits and impairments:  Abnormal gait, Decreased activity tolerance, Decreased balance, Decreased mobility, Decreased strength  Visit Diagnosis: Unsteadiness on feet  Repeated falls  Muscle weakness (generalized)       G-Codes - 07-02-2016 1539    Functional Assessment Tool Used Clinical judgement based on assessment of balance, strength, and mobility.    Functional Limitation Mobility: Walking and moving around   Mobility: Walking and Moving Around Goal Status [phone]) At least 1 percent  but less than 20 percent impaired, limited or restricted   Mobility: Walking and Moving Around Discharge Status [phone]) At least 1 percent but less than 20 percent impaired, limited or restricted      Problem List Patient Active Problem List   Diagnosis Date Noted  . 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 Frequent falls 04/21/2016  . 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 Type 2 diabetes mellitus with peripheral neuropathy (HCC)   . Fibromyalgia   . Chronic pain syndrome   . Chronic obstructive pulmonary disease ([place])   . Gait disturbance, post-stroke   . Dysarthria, post-stroke   . Dysphagia, post-stroke   . AKI (acute kidney injury) ([place])   . Acute blood loss anemia   . 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 Mixed hyperlipidemia   . 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  . Allergic rhinitis 12/17/2015  . Primary osteoarthritis of both knees 11/19/2015  . Obesity (BMI 30-39.9) 09/04/2015  . Essential hypertension 09/04/2015  . Constipation 09/04/2015  . Asthma 08/26/2015  . Raynaud's phenomenon 08/26/2015  . Gastro-esophageal reflux disease with esophagitis 08/26/2015  . Spinal stenosis 08/26/2015  . Vitamin D deficiency 08/26/2015  . Diabetes mellitus, type 2 ([place]) 08/22/2015  . HNP (herniated nucleus pulposus), cervical 07/10/2015  . Cervical radicular pain 04/30/2013  . Knee osteoarthritis 01/05/2012    PHYSICAL THERAPY DISCHARGE SUMMARY  Visits from Start of Care: 2  Current functional level related to goals / functional outcomes: See above for most current functional performance.   Remaining deficits: See above for more details regarding remaining deficits   Education / Equipment: re-evaluation findings and improvements made in all areas of mobility/balance/etc; additional activities pt can continue to perform at home for further improvements in function   Plan: Patient agrees to discharge.  Patient goals were partially met. Patient is being discharged due to being pleased with the current functional level.  ?????       3:45 PM,07-02-16 Elly Modena PT, DPT [facility] Outpatient Physical Therapy [phone]   [place] [address] [place], [place], [address] Phone: [phone]   Fax:  [phone]  Name: KRISTEN FROMM MRN: [id number] Date of Birth: 03-18-1941

## 2016-06-09 NOTE — Therapy (Signed)
[place], [place], [address] Phone: [phone]   Fax:  [phone]  Occupational Therapy Treatment and Discharge  Patient Details  Name: Susan Patel MRN: [id number] Date of Birth: Jul 20, 1940 Referring Provider: Dr. Alysia Penna  Encounter Date: 06/09/2016      OT End of Session - 06/09/16 1443    Visit Number 2   Number of Visits 4   Date for OT Re-Evaluation 06/11/16   Authorization Type Medicare A & B   Authorization Time Period Before 10th visit   Authorization - Visit Number 2   Authorization - Number of Visits 10   OT Start Time 2229   OT Stop Time 1430   OT Time Calculation (min) 41 min   Activity Tolerance Patient tolerated treatment well   Behavior During Therapy [facility] for tasks assessed/performed      Past Medical History:  Diagnosis Date  . Arthritis    'all over"  . Asthma   . COPD (chronic obstructive pulmonary disease) (HCC)    O2 per [place] at nights   . Depression   . Diabetes mellitus without complication ([place])    borderline- , states she was on med., but MD told her "everything is under control so I threw the bottle away"  . Fibromyalgia   . GERD (gastroesophageal reflux disease)   . Hypertension   . Neuromuscular disorder (HCC)    parkinson, neuropathy- both feet & hands.    Past Surgical History:  Procedure Laterality Date  . ABDOMINAL HYSTERECTOMY    . ANTERIOR CERVICAL DECOMP/DISCECTOMY FUSION N/A 07/10/2015   Procedure: Cervical five-six, Cervical six-seven anterior cervical decompression with fusion plating and bonegraft;  Surgeon: Jovita Gamma, MD;  Location: [facility];  Service: Neurosurgery;  Laterality: N/A;  . APPENDECTOMY    . BIOPSY EYE MUSCLE  03/20/2016   biopsy vessel to right eye due to swelling  . EYE SURGERY Bilateral    cataracts removed, /w "cyrstal lenses"   . SHOULDER ARTHROSCOPY Right    x2   RCR- spurs removed     There were no vitals filed  for this visit.      Subjective Assessment - 06/09/16 1437    Subjective  S: I didn't do the worksheets, I just practiced on my own.    Currently in Pain? No/denies            [facility] OT Assessment - 06/09/16 1353      Assessment   Diagnosis Left MCA CVA     Coordination   Right 9 Hole Peg Test 33.11"  38.68" previous     Strength   Right Hand Grip (lbs) 35  25 previous   Right Hand Lateral Pinch 10 lbs  12 previous   Right Hand 3 Point Pinch 10 lbs  9 previous   Left Hand Grip (lbs) 33  30 previous   Left Hand Lateral Pinch 10 lbs  same as previous   Left Hand 3 Point Pinch 10 lbs  same as previous                  OT Treatments/Exercises (OP) - 06/09/16 1438      Fine Motor Coordination   Fine Motor Coordination Handwriting   Handwriting Pt completed multiple handwriting activities this session, working on grasp, fluidity of movement, and legibility. Pt completed copying activity with lined paper, copying paragraph from magazine. Mod difficulty with fluidity due to looking back and forth from  magazine to paper and holding paper to right side of table. Pt then completed activity writing names, dates, and address on unlined paper. Pt with min difficulty with paper positioned in front of her on table. Educated pt on posture and set-up of handwriting tools for optimal movement and legibility. Also discussed various handwriting practice tasks.                 OT Education - Jul 06, 2016 1442    Education provided Yes   Education Details Educated on positioning and practice strategies for improved Geophysical data processor) Educated Patient;Spouse   Methods Explanation;Demonstration   Comprehension Verbalized understanding;Returned demonstration          OT Short Term Goals - 07/06/16 1444      OT SHORT TERM GOAL #1   Title Pt will be educated on and independent in HEP to improve fine motor and handwriting skills at home.    Time 4   Period  Weeks   Status Achieved     OT SHORT TERM GOAL #2   Title Pt will improve right fine motor coordination to increase ability to play piano by completing 9 hole peg test in under 35 seconds.    Time 4   Period Weeks   Status Achieved     OT SHORT TERM GOAL #3   Title Pt will improve handwriting legibility with RUE by 25% to improve ability to complete IADL tasks independently.    Time 4   Period Weeks   Status Achieved     OT SHORT TERM GOAL #4   Title Pt will return to highest level of functioning using RUE as dominant during ADL and leisure task completion.    Time 4   Period Weeks   Status Achieved                  Plan - 07/06/16 1443    Clinical Impression Statement A: Pt presents for first treatment visit since evaluation on 05/12/16. Pt reports she feels that she has improved with her handwriting, however did not complete exercises or handwriting practice provided as HEP. Reassessment completed, pt has met all STGs and is using RUE as dominant hand for all tasks. Pt continues to have difficulty with handwriting tasks, brainstormed strategies and problem-solved for handwriting practice this session. Husband present for session. Pt would like to discharge with handwriting practice strategies to continue at home.    Plan P: Discharge pt.    OT Home Exercise Plan 12/27: lined paper with handwriting practice instructions, fine motor exercises.    Consulted and Agree with Plan of Care Patient      Patient will benefit from skilled therapeutic intervention in order to improve the following deficits and impairments:  Decreased strength, Impaired UE functional use, Decreased coordination  Visit Diagnosis: Other lack of coordination  Other symptoms and signs involving cognitive functions following cerebral infarction      G-Codes - Jul 06, 2016 1450    Functional Assessment Tool Used clinical judgement   Functional Limitation Carrying, moving and handling objects    Carrying, Moving and Handling Objects Goal Status (S9702) At least 20 percent but less than 40 percent impaired, limited or restricted   Carrying, Moving and Handling Objects Discharge Status [phone]) At least 20 percent but less than 40 percent impaired, limited or restricted      Problem List Patient Active Problem List   Diagnosis Date Noted  . 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  Frequent falls 04/21/2016  . 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 Type 2 diabetes mellitus with peripheral neuropathy (HCC)   . Fibromyalgia   . Chronic pain syndrome   . Chronic obstructive pulmonary disease ([place])   . Gait disturbance, post-stroke   . Dysarthria, post-stroke   . Dysphagia, post-stroke   . AKI (acute kidney injury) ([place])   . Acute blood loss anemia   . 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 Mixed hyperlipidemia   . 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  . Allergic rhinitis 12/17/2015  . Primary osteoarthritis of both knees 11/19/2015  . Obesity (BMI 30-39.9) 09/04/2015  . Essential hypertension 09/04/2015  . Constipation 09/04/2015  . Asthma 08/26/2015  . Raynaud's phenomenon 08/26/2015  . Gastro-esophageal reflux disease with esophagitis 08/26/2015  . Spinal stenosis 08/26/2015  . Vitamin D deficiency 08/26/2015  . Diabetes mellitus, type 2 ([place]) 08/22/2015  . HNP (herniated nucleus pulposus), cervical 07/10/2015  . Cervical radicular pain 04/30/2013  . Knee osteoarthritis 01/05/2012    Guadelupe Sabin, OTR/L  [phone] 06/09/2016, 2:50 PM  [place] [address] [place], [place], [address] Phone: [phone]   Fax:  [phone]  Name: JASENIA WEILBACHER MRN: [id number] Date of Birth: February 16, 1941     OCCUPATIONAL THERAPY DISCHARGE SUMMARY  Visits from Start of Care: 2  Current functional level related to goals /  functional outcomes: See above. Pt has met all STGs and is using RUE as dominant in all functional tasks including fine motor coordination tasks. Pt reports improved handwriting skills, demonstrating improved legibility with practice.    Remaining deficits: Pt continues to have handwriting deficits with legibility and fluidity when focus is poor and when writing conditions are not ideal (poor posture, not using table, distracted).    Education / Equipment: Strategies for Research scientist (life sciences) Plan: Patient agrees to discharge.  Patient goals were met. Patient is being discharged due to meeting the stated rehab goals.  ?????

## 2016-06-30 ENCOUNTER — Other Ambulatory Visit: Payer: Self-pay | Admitting: Pediatrics

## 2016-07-02 ENCOUNTER — Other Ambulatory Visit: Payer: Self-pay | Admitting: Pediatrics

## 2016-07-06 ENCOUNTER — Other Ambulatory Visit: Payer: Self-pay | Admitting: Pediatrics

## 2016-07-09 DIAGNOSIS — H43812 Vitreous degeneration, left eye: Secondary | ICD-10-CM | POA: Diagnosis not present

## 2016-07-09 DIAGNOSIS — H3582 Retinal ischemia: Secondary | ICD-10-CM | POA: Diagnosis not present

## 2016-07-09 DIAGNOSIS — H35373 Puckering of macula, bilateral: Secondary | ICD-10-CM | POA: Diagnosis not present

## 2016-07-12 ENCOUNTER — Other Ambulatory Visit: Payer: Self-pay | Admitting: Pediatrics

## 2016-07-12 DIAGNOSIS — G8929 Other chronic pain: Secondary | ICD-10-CM

## 2016-07-15 ENCOUNTER — Encounter: Payer: Self-pay | Admitting: Pediatrics

## 2016-07-15 ENCOUNTER — Ambulatory Visit (INDEPENDENT_AMBULATORY_CARE_PROVIDER_SITE_OTHER): Payer: Medicare Other | Admitting: Pediatrics

## 2016-07-15 VITALS — BP 127/80 | HR 80 | Temp 96.7°F | Ht 61.0 in | Wt 178.0 lb

## 2016-07-15 DIAGNOSIS — M797 Fibromyalgia: Secondary | ICD-10-CM

## 2016-07-15 DIAGNOSIS — L309 Dermatitis, unspecified: Secondary | ICD-10-CM | POA: Diagnosis not present

## 2016-07-15 DIAGNOSIS — G8929 Other chronic pain: Secondary | ICD-10-CM

## 2016-07-15 DIAGNOSIS — I10 Essential (primary) hypertension: Secondary | ICD-10-CM | POA: Diagnosis not present

## 2016-07-15 MED ORDER — DULOXETINE HCL 30 MG PO CPEP: 60.0000 mg | ORAL_CAPSULE | Freq: Two times a day (BID) | ORAL | 0 refills | Status: DC

## 2016-07-15 MED ORDER — TRIAMCINOLONE ACETONIDE 0.025 % EX OINT: 1.0000 "application " | TOPICAL_OINTMENT | Freq: Two times a day (BID) | CUTANEOUS | 1 refills | Status: DC

## 2016-07-15 MED ORDER — TRAMADOL HCL 50 MG PO TABS: 25.0000 mg | ORAL_TABLET | Freq: Two times a day (BID) | ORAL | 2 refills | Status: DC | PRN

## 2016-07-15 MED ORDER — DULOXETINE HCL 30 MG PO CPEP: 60.0000 mg | ORAL_CAPSULE | Freq: Every day | ORAL | 3 refills | Status: DC

## 2016-07-15 MED ORDER — DULOXETINE HCL 30 MG PO CPEP: 60.0000 mg | ORAL_CAPSULE | Freq: Two times a day (BID) | ORAL | 3 refills | Status: DC

## 2016-07-15 NOTE — Progress Notes (Signed)
  Subjective:   Patient ID: Susan Patel, female    DOB: 02/25/1941, 76 y.o.   MRN: [id number] CC: Medication Refill  HPI: Susan Patel is a 76 y.o. female presenting for Medication Refill  Late effects CVA:  Finished with rehab Still feels some weakness R side, feels comfortable getting up moving around  HTN: taking meds regularly, BPs much improved at home  Having some headaches at night with hot flashes  Pain assessment: Cause of pain- fibromyalgia, OA, stroke Pain location- shoulders, back, arms, legs Pain on scale of 1-10- 10/10 at worst, 5/10 at best Frequency- daily What increases pain- walking sometimes What makes pain Better- tramadol Effects on ADL - helps with getting up and moving around Any change in general medical condition- no  Current medications- tramadol 50mg  BID prn, usually takes twice a day Effectiveness of current meds-helping, improves her ability to get up, move around Adverse reactions form pain meds-none  Was the [place] reviewed- yes  If yes were their any concerning findings? - no   Relevant past medical, surgical, family and social history reviewed. Allergies and medications reviewed and updated. History  Smoking Status  . Never Smoker  Smokeless Tobacco  . Never Used   ROS: Per HPI   Objective:    BP 127/80   Pulse 80   Temp (!) 96.7 F (35.9 C) (Oral)   Ht 5\' 1"  (1.549 m)   Wt 178 lb (80.7 kg)   BMI 33.63 kg/m   Wt Readings from Last 3 Encounters:  07/15/16 178 lb (80.7 kg)  05/13/16 179 lb 12.8 oz (81.6 kg)  04/21/16 184 lb (83.5 kg)    Gen: NAD, alert, cooperative with exam, NCAT EYES: EOMI, no conjunctival injection, or no icterus ENT:  OP without erythema LYMPH: no cervical LAD CV: NRRR, normal S1/S2, no murmur Resp: CTABL, no wheezes, normal WOB Abd: +BS, soft, NTND. no guarding or organomegaly Ext: No edema, warm Neuro: Alert, slightly dysarthric speech Skin: L inner wrist with small amount of redness over itchy  area  Assessment & Plan:  Susan Patel was seen today for medication refill.  Diagnoses and all orders for this visit:  Essential hypertension Much improved, continue current meds  Other chronic pain Tramadol helping with ADLs, gets her up moving around Take as needed up to twice a day, 1 Rx #60 tabs with refills given F/u 3 mo for next Rx -     traMADol (ULTRAM) 50 MG tablet; Take 0.5-1 tablets (25-50 mg total) by mouth every 12 (twelve) hours as needed for moderate pain or severe pain.  Eczema, unspecified type Cont steroid cream as needed -     triamcinolone (KENALOG) 0.025 % ointment; Apply 1 application topically 2 (two) times daily.  Fibromyalgia Take 60mg  cymbalta once a day, may help with hotflashes -     DULoxetine (CYMBALTA) 30 MG capsule; Take 2 capsules (60 mg total) by mouth daily.  Follow up plan: Return in about 3 months (around 10/15/2016). Assunta Found, MD [place]

## 2016-07-19 ENCOUNTER — Telehealth: Payer: Self-pay | Admitting: *Deleted

## 2016-07-19 NOTE — Telephone Encounter (Signed)
Called to check on patient and to see if patient is taking medication correctly.  Patient states that she is taking 60mg  of cymbalta once daily as prescribed.

## 2016-07-21 ENCOUNTER — Other Ambulatory Visit: Payer: Self-pay | Admitting: Pediatrics

## 2016-07-21 DIAGNOSIS — I10 Essential (primary) hypertension: Secondary | ICD-10-CM

## 2016-07-22 ENCOUNTER — Other Ambulatory Visit: Payer: Self-pay | Admitting: Family

## 2016-08-02 ENCOUNTER — Other Ambulatory Visit: Payer: Self-pay | Admitting: Pediatrics

## 2016-08-08 ENCOUNTER — Other Ambulatory Visit: Payer: Self-pay | Admitting: Pediatrics

## 2016-08-08 DIAGNOSIS — I10 Essential (primary) hypertension: Secondary | ICD-10-CM

## 2016-08-09 ENCOUNTER — Telehealth: Payer: Self-pay | Admitting: Pediatrics

## 2016-08-09 DIAGNOSIS — R232 Flushing: Secondary | ICD-10-CM

## 2016-08-09 MED ORDER — GABAPENTIN 100 MG PO CAPS: ORAL_CAPSULE | ORAL | 0 refills | Status: DC

## 2016-08-09 NOTE — Addendum Note (Signed)
Addended by: Eustaquio Maize on: 08/09/2016 03:49 PM   Modules accepted: Orders

## 2016-08-09 NOTE — Telephone Encounter (Signed)
Patient aware. Appointment made 4/25 at 2:30.

## 2016-08-09 NOTE — Telephone Encounter (Signed)
Per pt since d/cing HRT due to stroke, pt is having worsening night sweats Would like RX for this Please advise

## 2016-08-30 ENCOUNTER — Ambulatory Visit (INDEPENDENT_AMBULATORY_CARE_PROVIDER_SITE_OTHER): Payer: Medicare Other

## 2016-08-30 ENCOUNTER — Ambulatory Visit (INDEPENDENT_AMBULATORY_CARE_PROVIDER_SITE_OTHER): Payer: Medicare Other | Admitting: Family Medicine

## 2016-08-30 ENCOUNTER — Encounter: Payer: Self-pay | Admitting: Family Medicine

## 2016-08-30 VITALS — BP 124/69 | HR 77 | Temp 97.3°F | Ht 61.0 in | Wt 193.0 lb

## 2016-08-30 DIAGNOSIS — M545 Low back pain, unspecified: Secondary | ICD-10-CM

## 2016-08-30 DIAGNOSIS — M25531 Pain in right wrist: Secondary | ICD-10-CM | POA: Diagnosis not present

## 2016-08-30 DIAGNOSIS — M25511 Pain in right shoulder: Secondary | ICD-10-CM

## 2016-08-30 DIAGNOSIS — M25532 Pain in left wrist: Secondary | ICD-10-CM | POA: Diagnosis not present

## 2016-08-30 NOTE — Progress Notes (Signed)
BP 124/69   Pulse 77   Temp 97.3 F (36.3 C) (Oral)   Ht 5\' 1"  (1.549 m)   Wt 193 lb (87.5 kg)   BMI 36.47 kg/m    Subjective:    Patient ID: Susan Patel, female    DOB: [birth year], 76 y.o.   MRN: [id number]  HPI: Susan Patel is a 76 y.o. female presenting on 08/30/2016 for Back and buttock pain (fell 3 days ago) and Right ear pain (x 4 days, worsening)   HPI Fall and wrists shoulder and back pain Patient was up and moving around in her house and tripped over something and when she tripped she fell down just right so she landed on the her outstretched hands and her right shoulder and her buttocks. Since then she has been having some low back pain and wrist pain in both of her arms, left worse than right. She is also been having some right shoulder pain as well. All of the pain is about 5 out of 10 except in the lower back which is about 6 out of 10. She thinks she has some swelling in her wrists associated with this. She denies any deformity or bruising or numbness or weakness. She says she tripped over something is why she fell and it was not because she was off balance.  Relevant past medical, surgical, family and social history reviewed and updated as indicated. Interim medical history since our last visit reviewed. Allergies and medications reviewed and updated.  Review of Systems  Constitutional: Negative for chills and fever.  HENT: Negative for congestion, ear discharge and ear pain.   Eyes: Negative for redness and visual disturbance.  Respiratory: Negative for chest tightness and shortness of breath.   Cardiovascular: Negative for chest pain and leg swelling.  Genitourinary: Negative for difficulty urinating and dysuria.  Musculoskeletal: Positive for arthralgias and myalgias. Negative for back pain and gait problem.  Skin: Negative for rash.  Neurological: Negative for light-headedness and headaches.  Psychiatric/Behavioral: Negative for agitation and behavioral  problems.  All other systems reviewed and are negative.   Per HPI unless specifically indicated above     Objective:    BP 124/69   Pulse 77   Temp 97.3 F (36.3 C) (Oral)   Ht 5\' 1"  (1.549 m)   Wt 193 lb (87.5 kg)   BMI 36.47 kg/m   Wt Readings from Last 3 Encounters:  08/30/16 193 lb (87.5 kg)  07/15/16 178 lb (80.7 kg)  05/13/16 179 lb 12.8 oz (81.6 kg)    Physical Exam  Constitutional: She is oriented to person, place, and time. She appears well-developed and well-nourished. No distress.  Eyes: Conjunctivae are normal.  Cardiovascular: Normal rate, regular rhythm, normal heart sounds and intact distal pulses.   No murmur heard. Pulmonary/Chest: Effort normal and breath sounds normal. No respiratory distress. She has no wheezes.  Musculoskeletal: Normal range of motion. She exhibits tenderness. She exhibits no edema.       Right shoulder: She exhibits tenderness (Tenderness over superior aspect of the shoulder.). She exhibits normal range of motion, no bony tenderness, no swelling, no deformity and normal strength.       Right wrist: She exhibits tenderness (No decreased range of motion or deformity, tenderness is mostly over anterior wrist on the thumb side). She exhibits normal range of motion, no swelling and no laceration.       Left wrist: She exhibits tenderness (No decreased range of motion or deformity,  tenderness is mostly over anterior wrist on the thumb side). She exhibits normal range of motion, no swelling and no deformity.       Lumbar back: She exhibits tenderness (Bilateral paraspinal bandlike tenderness across lower back. No midline tenderness noted.). She exhibits normal range of motion, no bony tenderness, no edema and normal pulse.  Neurological: She is alert and oriented to person, place, and time. Coordination normal.  Skin: Skin is warm and dry. No rash noted. She is not diaphoretic.  Psychiatric: She has a normal mood and affect. Her behavior is normal.    Nursing note and vitals reviewed.   X-ray low back: No signs of acute bony abnormality or fractures, degeneration throughout. Await final read by radiologist  Shoulder x-ray right: No signs of acute bony abnormality, some arthritis and degenerative changes  Left wrist x-ray: No signs of acute bony abnormality, await final read by radiologist  Right wrist x-ray: No signs of acute bony abnormality await final read by radiologist    Assessment & Plan:   Problem List Items Addressed This Visit    None    Visit Diagnoses    Acute bilateral low back pain without sciatica    -  Primary   Relevant Orders   DG Lumbar Spine 2-3 Views (Completed)   Acute pain of left wrist       Relevant Orders   DG Wrist Complete Left (Completed)   Acute pain of right wrist       Relevant Orders   DG Wrist Complete Right (Completed)   Acute pain of right shoulder       Relevant Orders   DG Shoulder Right (Completed)      No fractures were noted on any of the x-rays, patient was instructed to continue using Tylenol and that the pain would likely improve quickly   Follow up plan: Return if symptoms worsen or fail to improve.  Counseling provided for all of the vaccine components Orders Placed This Encounter  Procedures  . DG Lumbar Spine 2-3 Views  . DG Wrist Complete Left  . DG Wrist Complete Right  . DG Shoulder Right    Caryl Pina, MD [facility] 08/30/2016, 2:25 PM

## 2016-09-03 ENCOUNTER — Other Ambulatory Visit: Payer: Self-pay | Admitting: Pediatrics

## 2016-09-03 NOTE — Telephone Encounter (Signed)
done

## 2016-09-07 ENCOUNTER — Other Ambulatory Visit: Payer: Self-pay | Admitting: Pediatrics

## 2016-09-07 DIAGNOSIS — R232 Flushing: Secondary | ICD-10-CM

## 2016-09-08 ENCOUNTER — Ambulatory Visit (INDEPENDENT_AMBULATORY_CARE_PROVIDER_SITE_OTHER): Payer: Medicare Other | Admitting: Pediatrics

## 2016-09-14 ENCOUNTER — Other Ambulatory Visit: Payer: Self-pay | Admitting: *Deleted

## 2016-09-15 ENCOUNTER — Other Ambulatory Visit: Payer: Self-pay

## 2016-09-15 ENCOUNTER — Ambulatory Visit (INDEPENDENT_AMBULATORY_CARE_PROVIDER_SITE_OTHER): Payer: Medicare Other | Admitting: Pediatrics

## 2016-09-15 ENCOUNTER — Encounter: Payer: Self-pay | Admitting: Pediatrics

## 2016-09-15 VITALS — BP 140/85 | HR 106 | Temp 97.4°F | Ht 61.0 in | Wt 193.2 lb

## 2016-09-15 DIAGNOSIS — M797 Fibromyalgia: Secondary | ICD-10-CM | POA: Diagnosis not present

## 2016-09-15 DIAGNOSIS — E119 Type 2 diabetes mellitus without complications: Secondary | ICD-10-CM

## 2016-09-15 DIAGNOSIS — I10 Essential (primary) hypertension: Secondary | ICD-10-CM | POA: Diagnosis not present

## 2016-09-15 DIAGNOSIS — E785 Hyperlipidemia, unspecified: Secondary | ICD-10-CM

## 2016-09-15 DIAGNOSIS — K5900 Constipation, unspecified: Secondary | ICD-10-CM

## 2016-09-15 DIAGNOSIS — G8929 Other chronic pain: Secondary | ICD-10-CM

## 2016-09-15 DIAGNOSIS — G4700 Insomnia, unspecified: Secondary | ICD-10-CM | POA: Diagnosis not present

## 2016-09-15 DIAGNOSIS — R232 Flushing: Secondary | ICD-10-CM | POA: Diagnosis not present

## 2016-09-15 LAB — BAYER DCA HB A1C WAIVED: HB A1C (BAYER DCA - WAIVED): 6.3 % (ref <7.0)

## 2016-09-15 MED ORDER — LOSARTAN POTASSIUM-HCTZ 100-25 MG PO TABS: 1.0000 | ORAL_TABLET | Freq: Every day | ORAL | 3 refills | Status: DC

## 2016-09-15 MED ORDER — TRAMADOL HCL 50 MG PO TABS: 50.0000 mg | ORAL_TABLET | Freq: Three times a day (TID) | ORAL | 2 refills | Status: DC | PRN

## 2016-09-15 MED ORDER — LINACLOTIDE 72 MCG PO CAPS: 72.0000 ug | ORAL_CAPSULE | Freq: Every day | ORAL | 0 refills | Status: DC

## 2016-09-15 MED ORDER — GABAPENTIN 300 MG PO CAPS: 300.0000 mg | ORAL_CAPSULE | Freq: Every day | ORAL | 3 refills | Status: DC

## 2016-09-15 MED ORDER — ROSUVASTATIN CALCIUM 40 MG PO TABS: 40.0000 mg | ORAL_TABLET | Freq: Every evening | ORAL | 0 refills | Status: DC

## 2016-09-15 NOTE — Progress Notes (Signed)
  Subjective:   Patient ID: Susan Patel, female    DOB: Oct 05, 1940, 76 y.o.   MRN: [id number] CC: Follow-up (4 week rck from adding gabapentin, helping )  HPI: Susan Patel is a 76 y.o. female presenting for Follow-up (4 week rck from adding gabapentin, helping )  Here today with her husband  Nightsweats: ongoing Stopped premarin after stroke last fall, hot flashes bothering her day and night since then Started on gabapentin last visit Helping some, but not enough  Mood has been fine  Continues to have constipation regularly OTC meds not helping  Fibromyalgia: has pain in shoulders, legs that limits her ADLs Has been taking tramadol, helps some with keeping her active at home  HLD: taking meds daily  Insomnia: continues to have trouble sleeping, taking trazodone which helps some, she thinks night sweats bothering her the most  Relevant past medical, surgical, family and social history reviewed. Allergies and medications reviewed and updated. History  Smoking Status  . Never Smoker  Smokeless Tobacco  . Never Used   ROS: Per HPI   Objective:    BP 140/85   Pulse (!) 106   Temp 97.4 F (36.3 C) (Oral)   Ht '5\' 1"'$  (1.549 m)   Wt 193 lb 3.2 oz (87.6 kg)   BMI 36.50 kg/m   Wt Readings from Last 3 Encounters:  09/15/16 193 lb 3.2 oz (87.6 kg)  08/30/16 193 lb (87.5 kg)  07/15/16 178 lb (80.7 kg)    Gen: NAD, alert, cooperative with exam, NCAT EYES: EOMI, no conjunctival injection, or no icterus ENT:  TMs pearly gray b/l, OP without erythema LYMPH: no cervical LAD CV: NRRR, normal S1/S2 Resp: CTABL, no wheezes, normal WOB Abd: +BS, soft, NTND. Ext: No edema, warm Neuro: Alert and oriented  Assessment & Plan:  Dennys was seen today for follow-up.  Diagnoses and all orders for this visit:  Essential hypertension slighlty elevated today, cont below meds, check at home, let me know if remains elevated -     losartan-hydrochlorothiazide (HYZAAR) 100-25 MG  tablet; Take 1 tablet by mouth daily. -     BMP8+EGFR  Other chronic pain Below improves QOL, helps her get up to do ADLs, will cont for now -     traMADol (ULTRAM) 50 MG tablet; Take 1 tablet (50 mg total) by mouth every 8 (eight) hours as needed for moderate pain or severe pain.  Hot flashes Pt stayed at '100mg'$  after last visit, will increase to '30mg'$  at night -     gabapentin (NEURONTIN) 300 MG capsule; Take 1 capsule (300 mg total) by mouth at bedtime.  Insomnia, unspecified type Likely multifactorial, will try to improve hotflahses as above, discussed sleep hygiene  Constipation, unspecified constipation type Ongoing symptom, trial of below -     linaclotide (LINZESS) 72 MCG capsule; Take 1 capsule (72 mcg total) by mouth daily before breakfast.  Type 2 diabetes mellitus without complication, without long-term current use of insulin (HCC)  A1c 6.3, diet controlled -     Bayer DCA Hb A1c Waived  Hyperlipidemia, unspecified hyperlipidemia type Stable, on rosuvastatin, continue -     Lipid Panel  Other orders -     Lipid panel   Follow up plan: Return in about 3 months (around 12/16/2016). Assunta Found, MD [place]

## 2016-09-16 LAB — LIPID PANEL
CHOL/HDL RATIO: 4 ratio (ref 0.0–4.4)
Cholesterol, Total: 175 mg/dL (ref 100–199)
HDL: 44 mg/dL (ref >39)
LDL Calculated: 97 mg/dL (ref 0–99)
Triglycerides: 169 mg/dL — ABNORMAL HIGH (ref 0–149)
VLDL CHOLESTEROL CAL: 34 mg/dL (ref 5–40)

## 2016-09-16 LAB — BMP8+EGFR
BUN/Creatinine Ratio: 31 — ABNORMAL HIGH (ref 12–28)
BUN: 26 mg/dL (ref 8–27)
CALCIUM: 9.4 mg/dL (ref 8.7–10.3)
CO2: 26 mmol/L (ref 18–29)
Chloride: 98 mmol/L (ref 96–106)
Creatinine, Ser: 0.85 mg/dL (ref 0.57–1.00)
GFR, EST AFRICAN AMERICAN: 79 mL/min/{1.73_m2} (ref >59)
GFR, EST NON AFRICAN AMERICAN: 68 mL/min/{1.73_m2} (ref >59)
GLUCOSE: 90 mg/dL (ref 65–99)
Potassium: 4 mmol/L (ref 3.5–5.2)
Sodium: 140 mmol/L (ref 134–144)

## 2016-09-20 ENCOUNTER — Telehealth: Payer: Self-pay | Admitting: Pediatrics

## 2016-09-24 NOTE — Telephone Encounter (Signed)
Form given to Dr. Evette Doffing to sign

## 2016-09-27 ENCOUNTER — Other Ambulatory Visit: Payer: Self-pay | Admitting: Pediatrics

## 2016-09-29 ENCOUNTER — Telehealth: Payer: Self-pay | Admitting: *Deleted

## 2016-09-29 ENCOUNTER — Other Ambulatory Visit: Payer: Self-pay | Admitting: *Deleted

## 2016-09-29 DIAGNOSIS — J410 Simple chronic bronchitis: Secondary | ICD-10-CM

## 2016-09-29 DIAGNOSIS — J45909 Unspecified asthma, uncomplicated: Secondary | ICD-10-CM

## 2016-09-29 NOTE — Telephone Encounter (Signed)
Called Patient.  Patient states that she is currently using 3L of O2.Marland Kitchen  Request for portable oxygen concentrator sent to [employer] at [phone] Per Dr. Evette Doffing.  Copy to be scanned into chart.

## 2016-09-29 NOTE — Telephone Encounter (Signed)
Referral placed to pulmonology per Dr. Autumn Patty request.   Patient previously seen Dr. Welford Roche at [facility]

## 2016-10-09 ENCOUNTER — Emergency Department (HOSPITAL_COMMUNITY): Payer: Medicare Other

## 2016-10-09 ENCOUNTER — Inpatient Hospital Stay (HOSPITAL_COMMUNITY)
Admission: EM | Admit: 2016-10-09 | Discharge: 2016-10-12 | DRG: 062 | Disposition: A | Discharge status: Rehabilitation Facility | Payer: Medicare Other | Attending: Neurology | Admitting: Neurology

## 2016-10-09 ENCOUNTER — Encounter (HOSPITAL_COMMUNITY): Payer: Self-pay | Admitting: Cardiology

## 2016-10-09 DIAGNOSIS — I639 Cerebral infarction, unspecified: Secondary | ICD-10-CM | POA: Diagnosis present

## 2016-10-09 DIAGNOSIS — I69322 Dysarthria following cerebral infarction: Secondary | ICD-10-CM | POA: Diagnosis not present

## 2016-10-09 DIAGNOSIS — E669 Obesity, unspecified: Secondary | ICD-10-CM | POA: Diagnosis present

## 2016-10-09 DIAGNOSIS — G4700 Insomnia, unspecified: Secondary | ICD-10-CM | POA: Diagnosis present

## 2016-10-09 DIAGNOSIS — Z7951 Long term (current) use of inhaled steroids: Secondary | ICD-10-CM

## 2016-10-09 DIAGNOSIS — E785 Hyperlipidemia, unspecified: Secondary | ICD-10-CM | POA: Diagnosis present

## 2016-10-09 DIAGNOSIS — J449 Chronic obstructive pulmonary disease, unspecified: Secondary | ICD-10-CM | POA: Diagnosis present

## 2016-10-09 DIAGNOSIS — R2689 Other abnormalities of gait and mobility: Secondary | ICD-10-CM | POA: Diagnosis not present

## 2016-10-09 DIAGNOSIS — R278 Other lack of coordination: Secondary | ICD-10-CM | POA: Diagnosis not present

## 2016-10-09 DIAGNOSIS — E1140 Type 2 diabetes mellitus with diabetic neuropathy, unspecified: Secondary | ICD-10-CM | POA: Diagnosis present

## 2016-10-09 DIAGNOSIS — R4701 Aphasia: Secondary | ICD-10-CM | POA: Diagnosis present

## 2016-10-09 DIAGNOSIS — Z7982 Long term (current) use of aspirin: Secondary | ICD-10-CM

## 2016-10-09 DIAGNOSIS — G8194 Hemiplegia, unspecified affecting left nondominant side: Secondary | ICD-10-CM | POA: Diagnosis present

## 2016-10-09 DIAGNOSIS — G4440 Drug-induced headache, not elsewhere classified, not intractable: Secondary | ICD-10-CM | POA: Diagnosis present

## 2016-10-09 DIAGNOSIS — I10 Essential (primary) hypertension: Secondary | ICD-10-CM | POA: Diagnosis present

## 2016-10-09 DIAGNOSIS — E1142 Type 2 diabetes mellitus with diabetic polyneuropathy: Secondary | ICD-10-CM | POA: Diagnosis present

## 2016-10-09 DIAGNOSIS — Z6837 Body mass index (BMI) 37.0-37.9, adult: Secondary | ICD-10-CM

## 2016-10-09 DIAGNOSIS — R29810 Facial weakness: Secondary | ICD-10-CM | POA: Diagnosis present

## 2016-10-09 DIAGNOSIS — Z90710 Acquired absence of both cervix and uterus: Secondary | ICD-10-CM | POA: Diagnosis not present

## 2016-10-09 DIAGNOSIS — I6300 Cerebral infarction due to thrombosis of unspecified precerebral artery: Secondary | ICD-10-CM | POA: Diagnosis not present

## 2016-10-09 DIAGNOSIS — Z9981 Dependence on supplemental oxygen: Secondary | ICD-10-CM | POA: Diagnosis not present

## 2016-10-09 DIAGNOSIS — T45615A Adverse effect of thrombolytic drugs, initial encounter: Secondary | ICD-10-CM | POA: Diagnosis present

## 2016-10-09 DIAGNOSIS — E46 Unspecified protein-calorie malnutrition: Secondary | ICD-10-CM | POA: Diagnosis not present

## 2016-10-09 DIAGNOSIS — R269 Unspecified abnormalities of gait and mobility: Secondary | ICD-10-CM | POA: Diagnosis not present

## 2016-10-09 DIAGNOSIS — E876 Hypokalemia: Secondary | ICD-10-CM | POA: Diagnosis present

## 2016-10-09 DIAGNOSIS — M797 Fibromyalgia: Secondary | ICD-10-CM | POA: Diagnosis present

## 2016-10-09 DIAGNOSIS — K219 Gastro-esophageal reflux disease without esophagitis: Secondary | ICD-10-CM | POA: Diagnosis present

## 2016-10-09 DIAGNOSIS — Z8249 Family history of ischemic heart disease and other diseases of the circulatory system: Secondary | ICD-10-CM | POA: Diagnosis not present

## 2016-10-09 DIAGNOSIS — R1310 Dysphagia, unspecified: Secondary | ICD-10-CM | POA: Diagnosis present

## 2016-10-09 DIAGNOSIS — I360 Nonrheumatic tricuspid (valve) stenosis: Secondary | ICD-10-CM | POA: Diagnosis not present

## 2016-10-09 DIAGNOSIS — G20 Parkinson's disease: Secondary | ICD-10-CM | POA: Diagnosis present

## 2016-10-09 DIAGNOSIS — G8929 Other chronic pain: Secondary | ICD-10-CM | POA: Diagnosis present

## 2016-10-09 DIAGNOSIS — R29717 NIHSS score 17: Secondary | ICD-10-CM | POA: Diagnosis present

## 2016-10-09 DIAGNOSIS — R1312 Dysphagia, oropharyngeal phase: Secondary | ICD-10-CM | POA: Diagnosis not present

## 2016-10-09 DIAGNOSIS — R05 Cough: Secondary | ICD-10-CM

## 2016-10-09 DIAGNOSIS — I69391 Dysphagia following cerebral infarction: Secondary | ICD-10-CM | POA: Diagnosis not present

## 2016-10-09 DIAGNOSIS — R4182 Altered mental status, unspecified: Secondary | ICD-10-CM | POA: Diagnosis not present

## 2016-10-09 DIAGNOSIS — R059 Cough, unspecified: Secondary | ICD-10-CM

## 2016-10-09 DIAGNOSIS — Z7984 Long term (current) use of oral hypoglycemic drugs: Secondary | ICD-10-CM | POA: Diagnosis not present

## 2016-10-09 DIAGNOSIS — I69398 Other sequelae of cerebral infarction: Secondary | ICD-10-CM | POA: Diagnosis not present

## 2016-10-09 DIAGNOSIS — K5900 Constipation, unspecified: Secondary | ICD-10-CM | POA: Diagnosis present

## 2016-10-09 DIAGNOSIS — E119 Type 2 diabetes mellitus without complications: Secondary | ICD-10-CM | POA: Diagnosis not present

## 2016-10-09 HISTORY — DX: Other chronic pain: G89.29

## 2016-10-09 HISTORY — DX: Cerebral infarction, unspecified: I63.9

## 2016-10-09 LAB — COMPREHENSIVE METABOLIC PANEL
ALBUMIN: 3.6 g/dL (ref 3.5–5.0)
ALK PHOS: 167 U/L — AB (ref 38–126)
ALT: 18 U/L (ref 14–54)
AST: 21 U/L (ref 15–41)
Anion gap: 11 (ref 5–15)
BILIRUBIN TOTAL: 0.6 mg/dL (ref 0.3–1.2)
BUN: 29 mg/dL — ABNORMAL HIGH (ref 6–20)
CALCIUM: 9.3 mg/dL (ref 8.9–10.3)
CO2: 29 mmol/L (ref 22–32)
CREATININE: 0.97 mg/dL (ref 0.44–1.00)
Chloride: 99 mmol/L — ABNORMAL LOW (ref 101–111)
GFR calc Af Amer: >60 mL/min (ref >60)
GFR calc non Af Amer: 57 mL/min — ABNORMAL LOW (ref >60)
GLUCOSE: 112 mg/dL — AB (ref 65–99)
Potassium: 3.5 mmol/L (ref 3.5–5.1)
SODIUM: 139 mmol/L (ref 135–145)
TOTAL PROTEIN: 6.9 g/dL (ref 6.5–8.1)

## 2016-10-09 LAB — URINALYSIS, ROUTINE W REFLEX MICROSCOPIC
Bilirubin Urine: NEGATIVE
GLUCOSE, UA: NEGATIVE mg/dL
Hgb urine dipstick: NEGATIVE
Ketones, ur: NEGATIVE mg/dL
NITRITE: NEGATIVE
PH: 5 (ref 5.0–8.0)
PROTEIN: NEGATIVE mg/dL
SPECIFIC GRAVITY, URINE: 1.014 (ref 1.005–1.030)

## 2016-10-09 LAB — I-STAT CHEM 8, ED
BUN: 27 mg/dL — AB (ref 6–20)
CHLORIDE: 97 mmol/L — AB (ref 101–111)
CREATININE: 1 mg/dL (ref 0.44–1.00)
Calcium, Ion: 1.12 mmol/L — ABNORMAL LOW (ref 1.15–1.40)
GLUCOSE: 108 mg/dL — AB (ref 65–99)
HCT: 36 % (ref 36.0–46.0)
Hemoglobin: 12.2 g/dL (ref 12.0–15.0)
Potassium: 3.4 mmol/L — ABNORMAL LOW (ref 3.5–5.1)
SODIUM: 137 mmol/L (ref 135–145)
TCO2: 31 mmol/L (ref 0–100)

## 2016-10-09 LAB — DIFFERENTIAL
Basophils Absolute: 0.1 10*3/uL (ref 0.0–0.1)
Basophils Relative: 1 %
Eosinophils Absolute: 0.2 10*3/uL (ref 0.0–0.7)
Eosinophils Relative: 3 %
LYMPHS ABS: 1.7 10*3/uL (ref 0.7–4.0)
LYMPHS PCT: 22 %
MONO ABS: 0.9 10*3/uL (ref 0.1–1.0)
Monocytes Relative: 11 %
NEUTROS ABS: 4.9 10*3/uL (ref 1.7–7.7)
NEUTROS PCT: 63 %

## 2016-10-09 LAB — I-STAT TROPONIN, ED: Troponin i, poc: 0 ng/mL (ref 0.00–0.08)

## 2016-10-09 LAB — APTT: aPTT: 29 seconds (ref 24–36)

## 2016-10-09 LAB — PROTIME-INR
INR: 1.03
PROTHROMBIN TIME: 13.5 s (ref 11.4–15.2)

## 2016-10-09 LAB — RAPID URINE DRUG SCREEN, HOSP PERFORMED
AMPHETAMINES: NOT DETECTED
BARBITURATES: NOT DETECTED
Benzodiazepines: NOT DETECTED
Cocaine: NOT DETECTED
Opiates: NOT DETECTED
TETRAHYDROCANNABINOL: NOT DETECTED

## 2016-10-09 LAB — CBC
HCT: 37.8 % (ref 36.0–46.0)
HEMOGLOBIN: 12.5 g/dL (ref 12.0–15.0)
MCH: 28.9 pg (ref 26.0–34.0)
MCHC: 33.1 g/dL (ref 30.0–36.0)
MCV: 87.3 fL (ref 78.0–100.0)
PLATELETS: 286 10*3/uL (ref 150–400)
RBC: 4.33 MIL/uL (ref 3.87–5.11)
RDW: 13.4 % (ref 11.5–15.5)
WBC: 7.8 10*3/uL (ref 4.0–10.5)

## 2016-10-09 LAB — GLUCOSE, CAPILLARY
GLUCOSE-CAPILLARY: 115 mg/dL — AB (ref 65–99)
GLUCOSE-CAPILLARY: 61 mg/dL — AB (ref 65–99)
GLUCOSE-CAPILLARY: 65 mg/dL (ref 65–99)
GLUCOSE-CAPILLARY: 75 mg/dL (ref 65–99)
GLUCOSE-CAPILLARY: 88 mg/dL (ref 65–99)

## 2016-10-09 LAB — ETHANOL: Alcohol, Ethyl (B): <5 mg/dL (ref <5)

## 2016-10-09 LAB — MRSA PCR SCREENING: MRSA by PCR: NEGATIVE

## 2016-10-09 MED ORDER — SODIUM CHLORIDE 0.9 % IV SOLN
INTRAVENOUS | Status: AC
  Administered 2016-10-09: 15:00:00 via INTRAVENOUS

## 2016-10-09 MED ORDER — STROKE: EARLY STAGES OF RECOVERY BOOK
Freq: Once | Status: AC
  Administered 2016-10-09: 1
  Filled 2016-10-09: qty 1

## 2016-10-09 MED ORDER — DEXTROSE 50 % IV SOLN
25.0000 mL | Freq: Once | INTRAVENOUS | Status: AC
  Administered 2016-10-09: 25 mL via INTRAVENOUS
  Filled 2016-10-09: qty 50

## 2016-10-09 MED ORDER — FENTANYL CITRATE (PF) 100 MCG/2ML IJ SOLN
12.5000 ug | Freq: Once | INTRAMUSCULAR | Status: AC
  Administered 2016-10-09: 12.5 ug via INTRAVENOUS
  Filled 2016-10-09: qty 2

## 2016-10-09 MED ORDER — INSULIN ASPART 100 UNIT/ML ~~LOC~~ SOLN
2.0000 [IU] | SUBCUTANEOUS | Status: DC
  Administered 2016-10-11 – 2016-10-12 (×3): 2 [IU] via SUBCUTANEOUS

## 2016-10-09 MED ORDER — PANTOPRAZOLE SODIUM 40 MG IV SOLR
40.0000 mg | Freq: Every day | INTRAVENOUS | Status: DC
  Administered 2016-10-09 – 2016-10-11 (×3): 40 mg via INTRAVENOUS
  Filled 2016-10-09 (×3): qty 40

## 2016-10-09 MED ORDER — ALTEPLASE (STROKE) FULL DOSE INFUSION
0.9000 mg/kg | Freq: Once | INTRAVENOUS | Status: AC
  Administered 2016-10-09: 78.8 mg via INTRAVENOUS
  Filled 2016-10-09: qty 100

## 2016-10-09 MED ORDER — ALTEPLASE 100 MG IV SOLR
INTRAVENOUS | Status: AC
  Filled 2016-10-09: qty 100

## 2016-10-09 MED ORDER — SODIUM CHLORIDE 0.9 % IV BOLUS (SEPSIS)
500.0000 mL | Freq: Once | INTRAVENOUS | Status: AC
  Administered 2016-10-09: 500 mL via INTRAVENOUS

## 2016-10-09 MED ORDER — ACETAMINOPHEN 160 MG/5ML PO SOLN: 650.0000 mg | ORAL | Status: DC | PRN

## 2016-10-09 MED ORDER — SODIUM CHLORIDE 0.9 % IV SOLN
INTRAVENOUS | Status: DC
  Administered 2016-10-09 – 2016-10-10 (×2): via INTRAVENOUS

## 2016-10-09 MED ORDER — DEXTROSE 50 % IV SOLN
25.0000 mL | Freq: Once | INTRAVENOUS | Status: AC
  Administered 2016-10-09: 25 mL via INTRAVENOUS

## 2016-10-09 MED ORDER — SODIUM CHLORIDE 0.9 % IV SOLN: 50.0000 mL | Freq: Once | INTRAVENOUS | Status: DC

## 2016-10-09 MED ORDER — DEXTROSE 50 % IV SOLN
INTRAVENOUS | Status: AC
  Filled 2016-10-09: qty 50

## 2016-10-09 MED ORDER — ASPIRIN 300 MG RE SUPP: 300.0000 mg | Freq: Every day | RECTAL | Status: DC

## 2016-10-09 MED ORDER — ACETAMINOPHEN 325 MG PO TABS
650.0000 mg | ORAL_TABLET | ORAL | Status: DC | PRN
  Administered 2016-10-11: 650 mg via ORAL
  Filled 2016-10-09: qty 2

## 2016-10-09 MED ORDER — IOPAMIDOL (ISOVUE-370) INJECTION 76%
80.0000 mL | Freq: Once | INTRAVENOUS | Status: AC | PRN
  Administered 2016-10-09: 80 mL via INTRAVENOUS

## 2016-10-09 MED ORDER — ACETAMINOPHEN 650 MG RE SUPP: 650.0000 mg | RECTAL | Status: DC | PRN

## 2016-10-09 NOTE — Progress Notes (Signed)
CODE STROKE Call time, 12:29 Beeper     12:28 Exam started 12: 37 Exam finished 12:40 images sent to [facility] Exam compleded in Epic 12:44, and called [facility] spoke with Dr. [place] Pink

## 2016-10-09 NOTE — ED Provider Notes (Signed)
[facility] DEPT Provider Note   CSN: [id number] Arrival date & time: 10/09/16  1228   An emergency department physician performed an initial assessment on this suspected stroke patient at 1230.  History   Chief Complaint Chief Complaint  Patient presents with  . Code Stroke    HPI Susan Patel is a 76 y.o. female.  The history is provided by the EMS personnel. The history is limited by the condition of the patient (non-verbal).  Pt was seen on arrival to ED exam room at 1225. Per EMS report:  Pt with sudden onset of "inability to speak, facial droop, and abnormal gait" that began approximately 11am today. Family spoke with pt at 10am and she could talk per her baseline at that time. EMS states pt unable to speak en route and would not follow commands. Code Stroke called by EMS en route.   Past Medical History:  Diagnosis Date  . Arthritis    'all over"  . Asthma   . Chronic pain   . COPD (chronic obstructive pulmonary disease) (HCC)    O2 per [place] at nights   . Depression   . Diabetes mellitus without complication ([place])    borderline- , states she was on med., but MD told her "everything is under control so I threw the bottle away"  . Fibromyalgia   . GERD (gastroesophageal reflux disease)   . Hypertension   . Neuromuscular disorder (HCC)    parkinson, neuropathy- both feet & hands.  . Stroke [facility])    residual dysarthria    Patient Active Problem List   Diagnosis Date Noted  . 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 Frequent falls 04/21/2016  . 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 Type 2 diabetes mellitus with peripheral neuropathy (HCC)   . Fibromyalgia   . Chronic pain syndrome   . Chronic obstructive pulmonary disease ([place])   . Gait disturbance, post-stroke   . Dysarthria, post-stroke   . Dysphagia, post-stroke   . 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 Mixed  hyperlipidemia   . 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  . Allergic rhinitis 12/17/2015  . Primary osteoarthritis of both knees 11/19/2015  . Obesity (BMI 30-39.9) 09/04/2015  . Essential hypertension 09/04/2015  . Constipation 09/04/2015  . Asthma 08/26/2015  . Raynaud's phenomenon 08/26/2015  . Gastro-esophageal reflux disease with esophagitis 08/26/2015  . Spinal stenosis 08/26/2015  . Vitamin D deficiency 08/26/2015  . Diabetes mellitus, type 2 ([place]) 08/22/2015  . HNP (herniated nucleus pulposus), cervical 07/10/2015  . Cervical radicular pain 04/30/2013  . Knee osteoarthritis 01/05/2012    Past Surgical History:  Procedure Laterality Date  . ABDOMINAL HYSTERECTOMY    . ANTERIOR CERVICAL DECOMP/DISCECTOMY FUSION N/A 07/10/2015   Procedure: Cervical five-six, Cervical six-seven anterior cervical decompression with fusion plating and bonegraft;  Surgeon: Jovita Gamma, MD;  Location: [place] NEURO ORS;  Service: Neurosurgery;  Laterality: N/A;  . APPENDECTOMY    . BIOPSY EYE MUSCLE  03/20/2016   biopsy vessel to right eye due to swelling  . EYE SURGERY Bilateral    cataracts removed, /w "cyrstal lenses"   . SHOULDER ARTHROSCOPY Right    x2   RCR- spurs removed     OB History    No data available       Home Medications    Prior to Admission medications   Medication Sig Start Date End Date  Taking? Authorizing Provider  acetaminophen (TYLENOL) 500 MG tablet Take 1,000 mg by mouth every 8 (eight) hours as needed for mild pain or moderate pain.    [provider]  albuterol (PROVENTIL HFA;VENTOLIN HFA) 108 (90 Base) MCG/ACT inhaler Inhale 1-2 puffs into the lungs every 6 (six) hours as needed for wheezing or shortness of breath. 01/01/16   Eustaquio Maize, MD  aspirin 325 MG EC tablet Take 1 tablet (325 mg total) by mouth daily. 05/14/16   Eustaquio Maize, MD  cetirizine (ZYRTEC) 10 MG tablet Take 10 mg by mouth daily.    [provider]    DULoxetine (CYMBALTA) 30 MG capsule Take 2 capsules (60 mg total) by mouth daily. 07/15/16   Eustaquio Maize, MD  fluticasone (FLONASE) 50 MCG/ACT nasal spray Place 2 sprays into both nostrils daily. Patient taking differently: Place 2 sprays into both nostrils daily as needed for allergies.  10/29/15   Evelina Dun A, FNP  fluticasone furoate-vilanterol (BREO ELLIPTA) 200-25 MCG/INH AEPB Inhale 1 puff into the lungs daily.    [provider]  gabapentin (NEURONTIN) 300 MG capsule Take 1 capsule (300 mg total) by mouth at bedtime. 09/15/16   Eustaquio Maize, MD  glimepiride (AMARYL) 2 MG tablet TAKE 1 TABLET (2 MG TOTAL) BY MOUTH DAILY BEFORE BREAKFAST. 09/27/16   Eustaquio Maize, MD  latanoprost (XALATAN) 0.005 % ophthalmic solution Place 1 drop into both eyes at bedtime. 03/31/16   Love, Ivan Anchors, PA-C  linaclotide (LINZESS) 72 MCG capsule Take 1 capsule (72 mcg total) by mouth daily before breakfast. 09/15/16   Eustaquio Maize, MD  losartan-hydrochlorothiazide (HYZAAR) 100-25 MG tablet Take 1 tablet by mouth daily. 09/15/16   Eustaquio Maize, MD  montelukast (SINGULAIR) 10 MG tablet TAKE 1 TABLET (10 MG TOTAL) BY MOUTH AT BEDTIME. 09/03/16   Eustaquio Maize, MD  [facility] LANCETS FINE MISC TEST TWICE DAILY 07/23/16   Eustaquio Maize, MD  OXYGEN Inhale 3-4 L into the lungs at bedtime.    [provider]  rosuvastatin (CRESTOR) 40 MG tablet Take 1 tablet (40 mg total) by mouth every evening. 09/15/16   Eustaquio Maize, MD  traMADol (ULTRAM) 50 MG tablet Take 1 tablet (50 mg total) by mouth every 8 (eight) hours as needed for moderate pain or severe pain. 09/15/16   Eustaquio Maize, MD  traZODone (DESYREL) 50 MG tablet TAKE 1 TABLET (50 MG TOTAL) BY MOUTH NIGHTLY AS NEEDED FOR FOR SLEEP. 09/03/16   Eustaquio Maize, MD  triamcinolone (KENALOG) 0.025 % ointment Apply 1 application topically 2 (two) times daily. 07/15/16   Eustaquio Maize, MD  Vitamin D, Ergocalciferol, (DRISDOL)  50000 units CAPS capsule Take 50,000 Units by mouth every Monday.  04/17/15   [provider]    Family History Family History  Problem Relation Age of Onset  . Heart disease Mother   . Cancer Father   . Heart disease Father     Social History Social History  Substance Use Topics  . Smoking status: Never Smoker  . Smokeless tobacco: Never Used  . Alcohol use No     Allergies   Azithromycin; Prednisone; Sulfamethoxazole-trimethoprim; Amlodipine; Ciprofloxacin; and Tape   Review of Systems Review of Systems  Unable to perform ROS: Patient nonverbal     Physical Exam Updated Vital Signs BP (!) 132/58   Pulse 65   Resp 19   Wt 87.5 kg (193 lb)   SpO2  99%   BMI 36.47 kg/m   Physical Exam 1230: Physical examination:  Nursing notes reviewed; Vital signs and O2 SAT reviewed;  Constitutional: Well developed, Well nourished, Well hydrated, In no acute distress; Head:  Normocephalic, atraumatic; Eyes: EOMI, PERRL, No scleral icterus; ENMT: Mouth and pharynx normal, Mucous membranes moist; Neck: Supple, Full range of motion, No lymphadenopathy; Cardiovascular: Regular rate and rhythm, No gallop; Respiratory: Breath sounds clear & equal bilaterally, No wheezes.  Speaking full sentences with ease, Normal respiratory effort/excursion; Chest: Nontender, Movement normal; Abdomen: Soft, Nontender, Nondistended, Normal bowel sounds; Genitourinary: No CVA tenderness; Extremities: Pulses normal, No tenderness, No edema, No calf edema or asymmetry.; Neuro: Awake, alert. Eyes open. Non-verbal. Shakes head "no" when asked if she can say her name. Grips equal. Strength 5/5 equal bilat UE's. 4/5 strength RLE, 2/4 strength LLE (drops when leg is lifted off bed)..; Skin: Color normal, Warm, Dry.   ED Treatments / Results  Labs (all labs ordered are listed, but only abnormal results are displayed)   EKG  EKG Interpretation  Date/Time:  Saturday Oct 09 2016 12:47:56 EDT Ventricular  Rate:  66 PR Interval:    QRS Duration: 106 QT Interval:  444 QTC Calculation: 466 R Axis:   9 Text Interpretation:  Sinus rhythm When compared with ECG of 03/20/2016 No significant change was found Confirmed by [facility]  MD, Nunzio Cory [phone]) on 10/09/2016 1:10:42 PM       Radiology   Procedures Procedures (including critical care time)  Medications Ordered in ED Medications  alteplase (ACTIVASE) 1 mg/mL infusion 79 mg (78.8 mg Intravenous New Bag/Given 10/09/16 1321)    Followed by  0.9 %  sodium chloride infusion (not administered)  alteplase (ACTIVASE) 1 mg/mL injection (not administered)     Initial Impression / Assessment and Plan / ED Course  I have reviewed the triage vital signs and the nursing notes.  Pertinent labs & imaging results that were available during my care of the patient were reviewed by me and considered in my medical decision making (see chart for details).  MDM Reviewed: previous chart, nursing note and vitals Reviewed previous: labs and ECG Interpretation: labs, ECG and CT scan Total time providing critical care: 30-74 minutes. This excludes time spent performing separately reportable procedures and services. Consults: neurology   CRITICAL CARE Performed by: Alfonzo Feller Total critical care time: 35 minutes Critical care time was exclusive of separately billable procedures and treating other patients. Critical care was necessary to treat or prevent imminent or life-threatening deterioration. Critical care was time spent personally by me on the following activities: development of treatment plan with patient and/or surrogate as well as nursing, discussions with consultants, evaluation of patient's response to treatment, examination of patient, obtaining history from patient or surrogate, ordering and performing treatments and interventions, ordering and review of laboratory studies, ordering and review of radiographic studies, pulse oximetry and  re-evaluation of patient's condition.   Results for orders placed or performed during the hospital encounter of 10/09/16  Ethanol  Result Value Ref Range   Alcohol, Ethyl (B) <5 <5 mg/dL  Protime-INR  Result Value Ref Range   Prothrombin Time 13.5 11.4 - 15.2 seconds   INR 1.03   APTT  Result Value Ref Range   aPTT 29 24 - 36 seconds  CBC  Result Value Ref Range   WBC 7.8 4.0 - 10.5 K/uL   RBC 4.33 3.87 - 5.11 MIL/uL   Hemoglobin 12.5 12.0 - 15.0 g/dL   HCT 37.8  36.0 - 46.0 %   MCV 87.3 78.0 - 100.0 fL   MCH 28.9 26.0 - 34.0 pg   MCHC 33.1 30.0 - 36.0 g/dL   RDW 13.4 11.5 - 15.5 %   Platelets 286 150 - 400 K/uL  Differential  Result Value Ref Range   Neutrophils Relative % 63 %   Neutro Abs 4.9 1.7 - 7.7 K/uL   Lymphocytes Relative 22 %   Lymphs Abs 1.7 0.7 - 4.0 K/uL   Monocytes Relative 11 %   Monocytes Absolute 0.9 0.1 - 1.0 K/uL   Eosinophils Relative 3 %   Eosinophils Absolute 0.2 0.0 - 0.7 K/uL   Basophils Relative 1 %   Basophils Absolute 0.1 0.0 - 0.1 K/uL  Comprehensive metabolic panel  Result Value Ref Range   Sodium 139 135 - 145 mmol/L   Potassium 3.5 3.5 - 5.1 mmol/L   Chloride 99 (L) 101 - 111 mmol/L   CO2 29 22 - 32 mmol/L   Glucose, Bld 112 (H) 65 - 99 mg/dL   BUN 29 (H) 6 - 20 mg/dL   Creatinine, Ser 0.97 0.44 - 1.00 mg/dL   Calcium 9.3 8.9 - 10.3 mg/dL   Total Protein 6.9 6.5 - 8.1 g/dL   Albumin 3.6 3.5 - 5.0 g/dL   AST 21 15 - 41 U/L   ALT 18 14 - 54 U/L   Alkaline Phosphatase 167 (H) 38 - 126 U/L   Total Bilirubin 0.6 0.3 - 1.2 mg/dL   GFR calc non Af Amer 57 (L) >60 mL/min   GFR calc Af Amer >60 >60 mL/min   Anion gap 11 5 - 15  Urine rapid drug screen (hosp performed)not at [facility]  Result Value Ref Range   Opiates NONE DETECTED NONE DETECTED   Cocaine NONE DETECTED NONE DETECTED   Benzodiazepines NONE DETECTED NONE DETECTED   Amphetamines NONE DETECTED NONE DETECTED   Tetrahydrocannabinol NONE DETECTED NONE DETECTED   Barbiturates  NONE DETECTED NONE DETECTED  Urinalysis, Routine w reflex microscopic  Result Value Ref Range   Color, Urine YELLOW YELLOW   APPearance HAZY (A) CLEAR   Specific Gravity, Urine 1.014 1.005 - 1.030   pH 5.0 5.0 - 8.0   Glucose, UA NEGATIVE NEGATIVE mg/dL   Hgb urine dipstick NEGATIVE NEGATIVE   Bilirubin Urine NEGATIVE NEGATIVE   Ketones, ur NEGATIVE NEGATIVE mg/dL   Protein, ur NEGATIVE NEGATIVE mg/dL   Nitrite NEGATIVE NEGATIVE   Leukocytes, UA SMALL (A) NEGATIVE   RBC / HPF 0-5 0 - 5 RBC/hpf   WBC, UA 6-30 0 - 5 WBC/hpf   Bacteria, UA FEW (A) NONE SEEN   Squamous Epithelial / LPF 0-5 (A) NONE SEEN   WBC Clumps PRESENT    Hyaline Casts, UA PRESENT   I-Stat Chem 8, ED  (not at [facility], [facility])  Result Value Ref Range   Sodium 137 135 - 145 mmol/L   Potassium 3.4 (L) 3.5 - 5.1 mmol/L   Chloride 97 (L) 101 - 111 mmol/L   BUN 27 (H) 6 - 20 mg/dL   Creatinine, Ser 1.00 0.44 - 1.00 mg/dL   Glucose, Bld 108 (H) 65 - 99 mg/dL   Calcium, Ion 1.12 (L) 1.15 - 1.40 mmol/L   TCO2 31 0 - 100 mmol/L   Hemoglobin 12.2 12.0 - 15.0 g/dL   HCT 36.0 36.0 - 46.0 %  I-stat troponin, ED (not at [facility], [facility])  Result Value Ref Range   Troponin i, poc 0.00 0.00 -  0.08 ng/mL   Comment 3           Ct Head Code Stroke W/o Cm Result Date: 10/09/2016 CLINICAL DATA:  Code stroke. Aphasia. Possible left facial droop and left lower extremity weakness. Right lower extremity weakness as well which may be chronic. EXAM: CT HEAD WITHOUT CONTRAST TECHNIQUE: Contiguous axial images were obtained from the base of the skull through the vertex without intravenous contrast. COMPARISON:  Head CT 03/20/2016 and MRI 03/21/2016 FINDINGS: Brain: A chronic infarct in the left internal capsule was acute on the prior MRI. Chronic lacunar infarcts are again seen in the right lentiform nucleus, periventricular white matter, corpus callosum, and left thalamus. There is a new small right occipital lobe infarct which may be subacute. There  is also a new 1 cm infarct in the right lentiform nucleus which may be acute or subacute. A new 5 mm infarct in the left lentiform nucleus is also of indeterminate age. No acute intracranial hemorrhage, mass, midline shift, or extra-axial fluid collection is identified. Cerebral atrophy is unchanged. Confluent cerebral white matter hypodensities are compatible with extensive chronic small vessel ischemia. Vascular: Calcified atherosclerosis at the skullbase. No hyperdense vessel. Skull: No fracture or focal osseous lesion. Sinuses/Orbits: Visualized paranasal sinuses and mastoid air cells are clear. Bilateral cataract extraction. Other: None. ASPECTS [facility] Stroke Program Early CT Score) Not scored due to extensive chronic ischemic changes and difficulty with symptom localization on physical examination. IMPRESSION: 1. Suspected acute right basal ganglia infarct. 2. New left basal ganglia lacunar infarct, of indeterminate age. 3. New right occipital lobe infarct, possibly subacute. 4. Extensive chronic small vessel ischemic disease. 5. No acute intracranial hemorrhage. These results were called by telephone at the time of interpretation on 10/09/2016 at 12:57 pm to Dr. Francine Graven , who verbally acknowledged these results. Electronically Signed   By: Logan Bores M.D.   On: 10/09/2016 13:03    1310:  [facility] Neuro Dr. Nicole Kindred has evaluated pt: requests to order TPA and obtain CT-A head and neck before transfer/admit to [facility]. T/C to [facility] Neuro Dr. Leonel Ramsay, case discussed, including:  HPI, pertinent PM/SHx, VS/PE, dx testing, ED course and treatment:  Agreeable with plan and to accept transfer/admit, requests to write temporary orders, obtain Neuro ICU bed to his service.   Final Clinical Impressions(s) / ED Diagnoses   Final diagnoses:  None    New Prescriptions New Prescriptions   No medications on file      Francine Graven, DO 10/10/16 2138

## 2016-10-09 NOTE — Evaluation (Signed)
Clinical/Bedside Swallow Evaluation Patient Details  Name: Susan Patel MRN: [id number] Date of Birth: [birth year]  Today's Date: 10/09/2016 Time: SLP Start Time (ACUTE ONLY): 51 SLP Stop Time (ACUTE ONLY): 1610 SLP Time Calculation (min) (ACUTE ONLY): 20 min  Past Medical History:  Past Medical History:  Diagnosis Date  . Arthritis    'all over"  . Asthma   . Chronic pain   . COPD (chronic obstructive pulmonary disease) (HCC)    O2 per [place] at nights   . Depression   . Diabetes mellitus without complication ([place])    borderline- , states she was on med., but MD told her "everything is under control so I threw the bottle away"  . Fibromyalgia   . GERD (gastroesophageal reflux disease)   . Hypertension   . Neuromuscular disorder (HCC)    parkinson, neuropathy- both feet & hands.  . Stroke [facility])    residual dysarthria   Past Surgical History:  Past Surgical History:  Procedure Laterality Date  . ABDOMINAL HYSTERECTOMY    . ANTERIOR CERVICAL DECOMP/DISCECTOMY FUSION N/A 07/10/2015   Procedure: Cervical five-six, Cervical six-seven anterior cervical decompression with fusion plating and bonegraft;  Surgeon: Jovita Gamma, MD;  Location: [place] NEURO ORS;  Service: Neurosurgery;  Laterality: N/A;  . APPENDECTOMY    . BIOPSY EYE MUSCLE  03/20/2016   biopsy vessel to right eye due to swelling  . EYE SURGERY Bilateral    cataracts removed, /w "cyrstal lenses"   . SHOULDER ARTHROSCOPY Right    x2   RCR- spurs removed    HPI:  Susan Patel a 76 y.o.femalewith history of T2DM with neuropathy, HTN, fibromyalgia--chronic pain, COPD--oxygen at nights,right visual deficits with recent CVA (left internal capsule) 03/2016 and resultant mild pharyngeal phase dysphagia (MBS showed swallow delay to pyriform sinus with thin and nectar-thick liquids). Pt with CIR, OP therapy d/c from OP 05/31/16 on regular diet with thin liquids. Admitted 10/09/16, CT shows suspected acute right basal ganglia  infarct, new left basal ganglia lacunar infarct, possibly subacute right occipital infarct.   Assessment / Plan / Recommendation Clinical Impression  Patient presents with severe risk for aspiration given decreased secretion management, prior CVA  with history of dysphagia and oral apraxia. Pt is alert and responds to Y/N questions via head nod, no functional speech observed during this evaluation. Follows commands to open mouth, phonate /a/, is otherwise unable to complete volitional oral movements on command. When presented with ice chip, manipulates orally with adequate containment, appearance of delayed swallow initiation though no overt signs of aspiration. 1/2 teaspoon pureed solid trialed; pt with prolonged oral holding, absent swallow initiation despite verbal and tactile cues. SLP requested to suction orally; pt continued attempting to initiate swallow, presented with strong cough and ultimately allowed SLP to suction remainder of oral residue. Recommend pt remain NPO, may have ice chips after oral care. SLP will f/u for PO/instrumental readiness, cognitive linguistic evaluation. SLP Visit Diagnosis: Dysphagia, unspecified (R13.10)    Aspiration Risk  Severe aspiration risk;Risk for inadequate nutrition/hydration    Diet Recommendation NPO;Ice chips PRN after oral care   Medication Administration: Via alternative means    Other  Recommendations Oral Care Recommendations: Oral care QID;Oral care prior to ice chip/H20   Follow up Recommendations        Frequency and Duration            Prognosis Prognosis for Safe Diet Advancement: Good Barriers to Reach Goals: Severity of deficits  Swallow Study   General Date of Onset: 10/09/16 HPI: Susan Patel a 76 y.o.femalewith history of T2DM with neuropathy, HTN, fibromyalgia--chronic pain, COPD--oxygen at nights,right visual deficits with recent CVA (left internal capsule) 03/2016 and resultant mild pharyngeal phase  dysphagia (MBS showed swallow delay to pyriform sinus with thin and nectar-thick liquids). Pt with CIR, OP therapy d/c from OP 05/31/16 on regular diet with thin liquids. Admitted 10/09/16, CT shows suspected acute right basal ganglia infarct, new left basal ganglia lacunar infarct, possibly subacute right occipital infarct. Type of Study: Bedside Swallow Evaluation Previous Swallow Assessment: see HPI Diet Prior to this Study: NPO Temperature Spikes Noted: No Respiratory Status: Room air History of Recent Intubation: No Behavior/Cognition: Alert;Cooperative Oral Cavity Assessment: Excessive secretions Oral Care Completed by SLP: Yes Oral Cavity - Dentition: Adequate natural dentition Vision: Functional for self-feeding Self-Feeding Abilities: Total assist Patient Positioning: Upright in bed Baseline Vocal Quality: Other (comment);Low vocal intensity (phonates /a/ only) Volitional Cough: Other (Comment) (unable to elicit 2/2 apraxia) Volitional Swallow: Unable to elicit    Oral/Motor/Sensory Function Overall Oral Motor/Sensory Function: Other (comment) (Difficult to assess fully given severe apraxia) Facial ROM: Reduced right;Reduced left Lingual ROM: Reduced right;Reduced left   Ice Chips Ice chips: Impaired Presentation: Spoon Pharyngeal Phase Impairments: Suspected delayed Swallow   Thin Liquid Thin Liquid: Not tested    Nectar Thick Nectar Thick Liquid: Not tested   Honey Thick Honey Thick Liquid: Not tested   Puree Puree: Impaired Presentation: Spoon Oral Phase Impairments: Reduced lingual movement/coordination Oral Phase Functional Implications: Prolonged oral transit;Oral holding Pharyngeal Phase Impairments: Cough - Delayed;Change in Vital Signs;Unable to trigger swallow Other Comments: required max cues to allow SLP to suction bolus from oral cavity   Solid   GO   Solid: Not tested        Susan Patel 10/09/2016,5:33 PM  Deneise Lever, [place],  [place] Pathologist [phone]

## 2016-10-09 NOTE — ED Triage Notes (Signed)
Family talked to her an hour ago and she could speak at that time. Now unable to speak,  Facial droop,  Not following commands and has abnormal gait.   CBG 115

## 2016-10-09 NOTE — ED Notes (Signed)
Pt having tele neurology done.  Unable to get complete vitals at this time due to MD assessment.

## 2016-10-09 NOTE — ED Notes (Signed)
Son at bedside and tele neurology being done.  Per son grand daughter spoke to her at 10 am on the phone and pt could talk at that time.  Son then states that pt came to kitchen table to eat about 11 and could not talk at that time.

## 2016-10-10 ENCOUNTER — Inpatient Hospital Stay (HOSPITAL_COMMUNITY): Payer: Medicare Other

## 2016-10-10 DIAGNOSIS — I360 Nonrheumatic tricuspid (valve) stenosis: Secondary | ICD-10-CM

## 2016-10-10 LAB — CBC
HCT: 36 % (ref 36.0–46.0)
HEMOGLOBIN: 11.7 g/dL — AB (ref 12.0–15.0)
MCH: 28.1 pg (ref 26.0–34.0)
MCHC: 32.5 g/dL (ref 30.0–36.0)
MCV: 86.5 fL (ref 78.0–100.0)
PLATELETS: 317 10*3/uL (ref 150–400)
RBC: 4.16 MIL/uL (ref 3.87–5.11)
RDW: 13.5 % (ref 11.5–15.5)
WBC: 8.5 10*3/uL (ref 4.0–10.5)

## 2016-10-10 LAB — ECHOCARDIOGRAM COMPLETE
HEIGHTINCHES: 61 in
WEIGHTICAEL: 3135.82 [oz_av]

## 2016-10-10 LAB — GLUCOSE, CAPILLARY
GLUCOSE-CAPILLARY: 105 mg/dL — AB (ref 65–99)
GLUCOSE-CAPILLARY: 102 mg/dL — AB (ref 65–99)
GLUCOSE-CAPILLARY: 85 mg/dL (ref 65–99)
GLUCOSE-CAPILLARY: 82 mg/dL (ref 65–99)
Glucose-Capillary: 85 mg/dL (ref 65–99)
Glucose-Capillary: 116 mg/dL — ABNORMAL HIGH (ref 65–99)
Glucose-Capillary: 88 mg/dL (ref 65–99)

## 2016-10-10 LAB — LIPID PANEL
CHOLESTEROL: 154 mg/dL (ref 0–200)
HDL: 43 mg/dL (ref >40)
LDL Cholesterol: 90 mg/dL (ref 0–99)
Total CHOL/HDL Ratio: 3.6 RATIO
Triglycerides: 104 mg/dL (ref <150)
VLDL: 21 mg/dL (ref 0–40)

## 2016-10-10 MED ORDER — POTASSIUM CHLORIDE 10 MEQ/100ML IV SOLN
10.0000 meq | INTRAVENOUS | Status: AC
  Administered 2016-10-10 (×3): 10 meq via INTRAVENOUS
  Filled 2016-10-10 (×3): qty 100

## 2016-10-10 MED ORDER — SODIUM CHLORIDE 0.9 % IV SOLN
INTRAVENOUS | Status: DC
  Administered 2016-10-10 – 2016-10-11 (×2): via INTRAVENOUS

## 2016-10-10 MED ORDER — CHLORHEXIDINE GLUCONATE 0.12 % MT SOLN
15.0000 mL | Freq: Two times a day (BID) | OROMUCOSAL | Status: DC
  Administered 2016-10-10: 15 mL via OROMUCOSAL

## 2016-10-10 MED ORDER — ORAL CARE MOUTH RINSE
15.0000 mL | Freq: Two times a day (BID) | OROMUCOSAL | Status: DC
  Administered 2016-10-11: 15 mL via OROMUCOSAL

## 2016-10-10 MED ORDER — PROMETHAZINE HCL 25 MG/ML IJ SOLN
12.5000 mg | Freq: Once | INTRAMUSCULAR | Status: AC
  Administered 2016-10-10: 12.5 mg via INTRAVENOUS
  Filled 2016-10-10: qty 1

## 2016-10-10 MED ORDER — ASPIRIN 300 MG RE SUPP
300.0000 mg | Freq: Every day | RECTAL | Status: DC
  Administered 2016-10-10: 300 mg via RECTAL
  Filled 2016-10-10: qty 1

## 2016-10-10 NOTE — Progress Notes (Signed)
PT Cancellation Note  Patient Details Name: Susan Patel MRN: [id number] DOB: 1940/07/09   Cancelled Treatment:    Reason Eval/Treat Not Completed: Medical issues which prohibited therapy   [name] is currently on strict bedrest;   Will need incr activity orders to proceed with PT eval;   Will follow up later today as time allows;  Otherwise, will follow up for PT tomorrow;   Thank you,  Roney [name], PT  Acute Rehabilitation Services Pager [phone] Office [place] 10/10/2016, 7:14 AM

## 2016-10-10 NOTE — Progress Notes (Signed)
STROKE TEAM PROGRESS NOTE   HISTORY OF PRESENT ILLNESS (per record) Susan Patel is a 76 y.o. female with a historyof previous stroke who was in her normal state of health at bedtime last night. Her daughter reported that she spoke with her at 10am and that she was in her normal state of health at that time. Subsequently she endorsed through head shaking that she was having more difficulty walking even on awakening, but this was after tpa.   She was speaking still and apparently normal enough at10am to be reported as normal. She went to breakfast after this and needed a little more help to get in her chair. She then was having some trouble swallowing and speaking and therefor 911 was called.    LKW: initially reported as 10am, but I suspect 5/5 prior to bed.  tpa given?: yes Saturday 10/09/2016 at 1415 ROS:  Unable to obtain due to altered mental status.     SUBJECTIVE (INTERVAL HISTORY) Patient's family is at bedside. She is improving.    OBJECTIVE Temp:  [97 F (36.1 C)-99.9 F (37.7 C)] 98.2 F (36.8 C) (05/27 0718) Pulse Rate:  [62-80] 71 (05/27 0600) Cardiac Rhythm: Normal sinus rhythm (05/27 0000) Resp:  [10-26] 18 (05/27 0600) BP: (123-165)/(48-133) 156/133 (05/27 0600) SpO2:  [92 %-100 %] 97 % (05/27 0600) Weight:  [87.5 kg (193 lb)-88.9 kg (195 lb 15.8 oz)] 88.9 kg (195 lb 15.8 oz) (05/26 1500)  CBC:  Recent Labs Lab 10/09/16 1235 10/09/16 1242  WBC 7.8  --   NEUTROABS 4.9  --   HGB 12.5 12.2  HCT 37.8 36.0  MCV 87.3  --   PLT 286  --     Basic Metabolic Panel:  Recent Labs Lab 10/09/16 1235 10/09/16 1242  NA 139 137  K 3.5 3.4*  CL 99* 97*  CO2 29  --   GLUCOSE 112* 108*  BUN 29* 27*  CREATININE 0.97 1.00  CALCIUM 9.3  --     Lipid Panel:    Component Value Date/Time   CHOL 154 10/10/2016 0141   CHOL 175 09/15/2016 1723   TRIG 104 10/10/2016 0141   HDL 43 10/10/2016 0141   HDL 44 09/15/2016 1723   CHOLHDL 3.6 10/10/2016 0141   VLDL 21  10/10/2016 0141   LDLCALC 90 10/10/2016 0141   LDLCALC 97 09/15/2016 1723   HgbA1c:  Lab Results  Component Value Date   HGBA1C 6.6 (H) 03/21/2016   Urine Drug Screen:    Component Value Date/Time   LABOPIA NONE DETECTED 10/09/2016 1350   COCAINSCRNUR NONE DETECTED 10/09/2016 1350   LABBENZ NONE DETECTED 10/09/2016 1350   AMPHETMU NONE DETECTED 10/09/2016 1350   THCU NONE DETECTED 10/09/2016 1350   LABBARB NONE DETECTED 10/09/2016 1350    Alcohol Level     Component Value Date/Time   ETH <5 10/09/2016 1235    IMAGING  Ct Angio Head W/cm &/or Wo Cm Ct Angio Neck W And/or Wo Contrast 10/09/2016 1. No emergent large vessel occlusion.  2. Progressive, advanced intracranial atherosclerosis including severe stenoses involving both supraclinoid ICAs, MCA bifurcation regions, distal left vertebral artery, proximal PCAs, and numerous anterior and posterior circulation branch vessels. Right PCA branch vessel occlusion corresponding to the previously described infarct.  3. No cervical carotid artery stenosis.     Ct Head Wo Contrast 10/10/2016 No intracranial hemorrhage or acute intracranial process. Stable age indeterminate basal ganglia and thalamus lacunar infarcts. Subacute to old RIGHT occipital lobe/ PCA territory  infarct. Severe chronic small vessel ischemic disease.     Ct Head Code Stroke W/o Cm 10/09/2016 1. Suspected acute right basal ganglia infarct.  2. New left basal ganglia lacunar infarct, of indeterminate age.  3. New right occipital lobe infarct, possibly subacute.  4. Extensive chronic small vessel ischemic disease.  5. No acute intracranial hemorrhage.     Physical exam: Exam: Gen: NAD, alert            CV: RRR, no MRG. No Carotid Bruits. No peripheral edema, warm, nontender Eyes: Conjunctivae clear without exudates or hemorrhage  Neuro: Detailed Neurologic Exam  Speech:    Speech is aphasic more expressive than receptive Cognition:    The patient  is oriented to person, month, year. Can follow simple commands.    Cranial Nerves:    The pupils are equal, round, and reactive to light. Visual fields are full to finger confrontation. Extraocular movements are intact. Trigeminal sensation is intact and the muscles of mastication are normal. The face is symmetric. The palate elevates in the midline. Hearing intact. Voice is normal. Shoulder shrug is normal. The tongue has normal motion without fasciculations.   Coordination:    No dysmetria noted   Gait:    Heel-toe and tandem gait are normal.   Motor Observation:    no involuntary movements noted. Tone:    Normal muscle tone.     Strength:    Left arm and leg antigravity with drift to bed. Right side intact.      Sensation: intact to LT       ASSESSMENT/PLAN Ms. AUDRAY RUMORE is a 76 y.o. female with history of previous stroke, hypertension, neuromuscular disorder, fibromyalgia, diabetes mellitus, COPD, chronic pain, neuropathy, and depression presenting with gait disturbance, occult swallowing, and speech difficulties.  The patient received TPA - Saturday 10/09/2016 at 1415   Stroke:  Right basal ganglia infarct. Likely due to small vessel disease.  Resultant  Left hemiparesis, expressive aphasia  CT head - multiple subacute to old infarcts.  MRI head - pending  MRA head - not performed  CTA H&N - Severe, progressive, advanced intracranial atherosclerosis as above.  Carotid Doppler - CTA neck  2D Echo - pending  LDL - 90  HgbA1c - pending  VTE prophylaxis  Diet NPO time specified  aspirin 325 mg daily prior to admission, now on No antithrombotic secondary to TPA therapy.  Patient counseled to be compliant with her antithrombotic medications  Ongoing aggressive stroke risk factor management  Therapy recommendations:  pending  Disposition: Pending  Hypertension  Stable - avoid hypotension  Permissive hypertension (OK if < 220/120) but gradually  normalize in 5-7 days  Long-term BP goal normotensive  Hyperlipidemia  Home meds:  Crestor 40 mg daily - not resumed in hospital  LDL 90, goal < 70  Resume Crestor 40 mg daily (max dose) when PO access is available.  Continue statin at discharge  Diabetes  HgbA1c pending, goal < 7.0  Unc / Controlled  Other Stroke Risk Factors  Advanced age  Obesity, Body mass index is 37.03 kg/m., recommend weight loss, diet and exercise as appropriate   Hx stroke/TIA   Other Active Problems  Mild hypokalemia - 3.4 - supplement - recheck Tuesday.  Possible aspiration per nursing - CXR and CBC (afebrile - temp 99.9 yesterday)  Hospital day # 1  Personally examined patient and images, and have participated in and made any corrections needed to history, physical, neuro exam,assessment and plan as  stated above.  I have personally obtained the history, evaluated lab date, reviewed imaging studies and agree with radiology interpretations.    Sarina Ill, MD Stroke Neurology   To contact Stroke Continuity provider, please refer to [email or web address] After hours, contact General Neurology

## 2016-10-10 NOTE — H&P (Signed)
Neurology H&P  CC: Aphasia  History is obtained from:Husband  HPI: Susan Patel is a 76 y.o. female with a historyof previousstroke who was in her normla state of health at bedtime last night. Her daughter reported that she spoke with her at 10am and that she was in her normal state of health at that time. Subsequently she endorsed through head shaking that she was having more difficulty walking even on awakening, but this was after tpa.   She was speaking still and apparently normal enough at10am to be reported as normal. She went to breakfast after this and needed a little more help to get in her chair. She then was having some trouble swallowing and speaking and therefor e911 was called.    LKW: initially reported as 10am, but I suspect 5/5 prior to bed.  tpa given?: yes ROS:  Unable to obtain due to altered mental status.   Past Medical History:  Diagnosis Date  . Arthritis    'all over"  . Asthma   . Chronic pain   . COPD (chronic obstructive pulmonary disease) (HCC)    O2 per [place] at nights   . Depression   . Diabetes mellitus without complication ([place])    borderline- , states she was on med., but MD told her "everything is under control so I threw the bottle away"  . Fibromyalgia   . GERD (gastroesophageal reflux disease)   . Hypertension   . Neuromuscular disorder (HCC)    parkinson, neuropathy- both feet & hands.  . Stroke [facility])    residual dysarthria     Family History  Problem Relation Age of Onset  . Heart disease Mother   . Cancer Father   . Heart disease Father      Social History:  reports that she has never smoked. She has never used smokeless tobacco. She reports that she does not drink alcohol or use drugs.   Exam: Current vital signs: BP (!) 148/78   Pulse 75   Temp 99.3 F (37.4 C)   Resp 14   Ht 5\' 1"  (1.549 m)   Wt 88.9 kg (195 lb 15.8 oz)   SpO2 99%   BMI 37.03 kg/m  Vital signs in last 24 hours: Temp:  [97 F (36.1 C)-99.9 F (37.7  C)] 99.3 F (37.4 C) (05/27 0400) Pulse Rate:  [62-80] 75 (05/27 0400) Resp:  [10-26] 14 (05/27 0400) BP: (123-165)/(48-105) 148/78 (05/27 0400) SpO2:  [92 %-100 %] 99 % (05/27 0400) Weight:  [87.5 kg (193 lb)-88.9 kg (195 lb 15.8 oz)] 88.9 kg (195 lb 15.8 oz) (05/26 1500)  Physical Exam  Constitutional: Appears elderly Psych: Affect appropriate to situation Eyes: No scleral injection HENT: No OP obstrucion Head: Normocephalic.  Cardiovascular: Normal rate and regular rhythm.  Respiratory: Effort normal  GI: Soft.  No distension. There is no tenderness.  Skin: WDI  Neuro: Mental Status: Patient is awake, alert, unable to speak but no apparent difficulty with comprehension Appears to possibly have some neglect.  Cranial Nerves: II: Visual Fields are full. Pupils are equal, round, and reactive to light.   III,IV, VI: EOMI without ptosis or diploplia.  V: Facial sensation is symmetric to temperature VII: She is unable to move either side of her face well.  VIII: hearing is intact to voice X: palate does not elevate XII: unable to protrude tongue Motor: She is able to move the rightside well, but does not lift the left voluntarily, but does move both the  left arm and leg against gravity when not asked to (suspect neglect) Sensory: Reports symmetric sensation, difficult to assess double simultaneous stimulation.  Cerebellar: No clear ataxia on right   I have reviewed labs in epic and the results pertinent to this consultation are: cmp - unremarkable Cbc - unremarkable  I have reviewed the images obtained:CT head - new multifocal strokes  Impression: 76 yo F with new strokes. I am actually not sure if this is aphasia or severe bulbar dysfunction. I have made her strict npo pending swallow eval.   Recommendations: 1. HgbA1c, fasting lipid panel 2. MRI of the brain without contrast 3. Frequent neuro checks 4. Echocardiogram 5. Carotid dopplers are not needed given CTA 6.  Prophylactic therapy-Antiplatelet med: none for 24 hours.  7. Risk factor modification 8. Telemetry monitoring 9. PT consult, OT consult, Speech consult 10. please page stroke NP  Or  PA  Or MD  from 8am -4 pm as this patient will be followed by the stroke team at this point.   You can look them up on [email or web address]      This patient is critically ill and at significant risk of neurological worsening, death and care requires constant monitoring of vital signs, hemodynamics,respiratory and cardiac monitoring, neurological assessment, discussion with family, other specialists and medical decision making of high complexity. I spent 35 minutes of neurocritical care time  in the care of  this patient.  Roland Rack, MD [facility] [phone]  If 7pm- 7am, please page neurology on call as listed in [place]. 10/10/2016  4:38 AM

## 2016-10-10 NOTE — Progress Notes (Signed)
Around 0030 pt complained of sudden onset 10/10 headache. No other change in neuro status noted.  Neurology consulted and pt taken for STAT head CT.Susan Patel

## 2016-10-10 NOTE — Evaluation (Signed)
Speech Language Pathology Evaluation Patient Details Name: Susan Patel MRN: [id number] DOB: 1940/09/27 Today's Date: 10/10/2016 Time: 0076-2263 SLP Time Calculation (min) (ACUTE ONLY): 45 min  Problem List:  Patient Active Problem List   Diagnosis Date Noted  . Stroke ([place]) 10/09/2016  . Stroke (cerebrum) ([place]) 10/09/2016  . 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 Frequent falls 04/21/2016  . 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 Type 2 diabetes mellitus with peripheral neuropathy (HCC)   . Fibromyalgia   . Chronic pain syndrome   . Chronic obstructive pulmonary disease ([place])   . Gait disturbance, post-stroke   . Dysarthria, post-stroke   . Dysphagia, post-stroke   . Cerebrovascular accident (CVA) due to occlusion of basilar artery ([place])   . Mixed hyperlipidemia   . 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  . Allergic rhinitis 12/17/2015  . Primary osteoarthritis of both knees 11/19/2015  . Obesity (BMI 30-39.9) 09/04/2015  . Essential hypertension 09/04/2015  . Constipation 09/04/2015  . Asthma 08/26/2015  . Raynaud's phenomenon 08/26/2015  . Gastro-esophageal reflux disease with esophagitis 08/26/2015  . Spinal stenosis 08/26/2015  . Vitamin D deficiency 08/26/2015  . Diabetes mellitus, type 2 ([place]) 08/22/2015  . HNP (herniated nucleus pulposus), cervical 07/10/2015  . Cervical radicular pain 04/30/2013  . Knee osteoarthritis 01/05/2012   Past Medical History:  Past Medical History:  Diagnosis Date  . Arthritis    'all over"  . Asthma   . Chronic pain   . COPD (chronic obstructive pulmonary disease) (HCC)    O2 per [place] at nights   . Depression   . Diabetes mellitus without complication ([place])    borderline- , states she was on med., but MD told her "everything is under control so I threw the bottle away"  . Fibromyalgia   . GERD (gastroesophageal reflux disease)   . Hypertension   . Neuromuscular  disorder (HCC)    parkinson, neuropathy- both feet & hands.  . Stroke [facility])    residual dysarthria   Past Surgical History:  Past Surgical History:  Procedure Laterality Date  . ABDOMINAL HYSTERECTOMY    . ANTERIOR CERVICAL DECOMP/DISCECTOMY FUSION N/A 07/10/2015   Procedure: Cervical five-six, Cervical six-seven anterior cervical decompression with fusion plating and bonegraft;  Surgeon: Jovita Gamma, MD;  Location: [facility];  Service: Neurosurgery;  Laterality: N/A;  . APPENDECTOMY    . BIOPSY EYE MUSCLE  03/20/2016   biopsy vessel to right eye due to swelling  . EYE SURGERY Bilateral    cataracts removed, /w "cyrstal lenses"   . SHOULDER ARTHROSCOPY Right    x2   RCR- spurs removed    HPI:  Susan Patel a 76 y.o.femalewith history of T2DM with neuropathy, HTN, fibromyalgia--chronic pain, COPD--oxygen at nights,right visual deficits with recent CVA (left internal capsule) 03/2016 and resultant mild pharyngeal phase dysphagia (MBS showed swallow delay to pyriform sinus with thin and nectar-thick liquids). Pt with CIR, OP therapy d/c from OP 05/31/16 on regular diet with thin liquids. Admitted 10/09/16, CT shows suspected acute right basal ganglia infarct, new left basal ganglia lacunar infarct, possibly subacute right occipital infarct.   Assessment / Plan / Recommendation Clinical Impression  Pt currently with a severe dysarthria and severe expressive language deficits, although suspect that motor planning is playing a role in language difficulties. Pt's speech is ~30% intelligible given context. Motor planning difficulties were inconsistent with pt occasionally groping for words. Was able to  state basic biographical information and answer some yes/ no questions; confrontation naming ~25% with phonemic/ articulatory cues beneficial. Receptive language appears relatively intact as pt able to follow 2-step commands without difficulty and answer yes/ no questions accurately.  Sustained attention at times reduced in conversation and during PO trials. Pt will benefit from skilled SLP services for for motor speech and language skills to increase functional communication to have wants/ needs met in hospital.    SLP Assessment  SLP Recommendation/Assessment: Patient needs continued Speech [facility] SLP Visit Diagnosis: Aphasia (R47.01);Dysarthria and anarthria (R47.1)    Follow Up Recommendations  Skilled Nursing facility    Frequency and Duration min 3x week  2 weeks      SLP Evaluation Cognition  Overall Cognitive Status: Impaired/Different from baseline Arousal/Alertness: Awake/alert Orientation Level: Oriented to person;Oriented to place;Oriented to time (difficult to fully assess due to aphasia) Attention: Sustained;Selective Sustained Attention: Impaired Sustained Attention Impairment: Verbal basic;Functional basic Selective Attention: Impaired Selective Attention Impairment: Verbal basic;Functional basic       Comprehension  Auditory Comprehension Overall Auditory Comprehension: Appears within functional limits for tasks assessed Yes/No Questions: Within Functional Limits Commands: Within Functional Limits    Expression Expression Primary Mode of Expression: Verbal Verbal Expression Overall Verbal Expression: Impaired Initiation: Impaired Automatic Speech: Name Level of Generative/Spontaneous Verbalization: Phrase Repetition: Impaired Naming: Impairment Confrontation: Impaired Pragmatics: No impairment Effective Techniques: Phonemic cues;Articulatory cues   Oral / Motor  Oral Motor/Sensory Function Overall Oral Motor/Sensory Function: Severe impairment Facial ROM: Reduced left Facial Symmetry: Abnormal symmetry left;Suspected CN VII (facial) dysfunction Lingual ROM: Reduced right;Reduced left Lingual Strength: Reduced;Suspected CN XII (hypoglossal) dysfunction Motor Speech Overall Motor Speech: Impaired Respiration:  Within functional limits Phonation: Hoarse;Low vocal intensity Resonance: Within functional limits Articulation: Impaired Level of Impairment: Word Intelligibility: Intelligibility reduced Word: 25-49% accurate Motor Planning: Impaired Level of Impairment: Word Motor Speech Errors: Aware;Inconsistent   GO                    Saverio Kader Dionicia Abler, MA, CCC-SLP 10/10/2016, 11:04 AM

## 2016-10-10 NOTE — Progress Notes (Signed)
  Echocardiogram 2D Echocardiogram has been performed.  Donata Clay 10/10/2016, 5:00 PM

## 2016-10-10 NOTE — Progress Notes (Signed)
  Speech Language Pathology Treatment: Dysphagia  Patient Details Name: Susan Patel MRN: [id number] DOB: 04-24-1941 Today's Date: 10/10/2016 Time: 9983-3825 SLP Time Calculation (min) (ACUTE ONLY): 45 min  Assessment / Plan / Recommendation Clinical Impression  Dysphagia treatment provided for PO trials. Pt alert, oriented to self and location, some decreased sustained attention to PO trials and needed occasional verbal cues. Pt showed immediate and delayed s/s of aspiration (cough) following trials of ice chips and small sips of thin liquid by cup. With trials of puree pt was unable to orally manipulate bolus and required suction for removal. Pt also appeared to have some motor planning difficulties including occasional difficulty initiating a cough; face turned red and held hand to mouth to cover mouth but took ~5 seconds after that before cough began. Given these findings pt continues to be at high risk of aspiration. Recommend that pt remain strictly NPO with meds via alternative means. Will continue to follow closely for PO readiness.   HPI HPI: Susan Lizama Knightis a 76 y.o.femalewith history of T2DM with neuropathy, HTN, fibromyalgia--chronic pain, COPD--oxygen at nights,right visual deficits with recent CVA (left internal capsule) 03/2016 and resultant mild pharyngeal phase dysphagia (MBS showed swallow delay to pyriform sinus with thin and nectar-thick liquids). Pt with CIR, OP therapy d/c from OP 05/31/16 on regular diet with thin liquids. Admitted 10/09/16, CT shows suspected acute right basal ganglia infarct, new left basal ganglia lacunar infarct, possibly subacute right occipital infarct.      SLP Plan  Continue with current plan of care       Recommendations  Diet recommendations: NPO Medication Administration: Via alternative means                Oral Care Recommendations: Oral care QID Follow up Recommendations: Skilled Nursing facility SLP Visit Diagnosis: Dysphagia,  unspecified (R13.10) Plan: Continue with current plan of care       GO                Susan Reap, MA, [place] 10/10/2016, 10:45 AM

## 2016-10-10 NOTE — Progress Notes (Signed)
STAT CT negative for new changes. Treating headache with phenergan.   Electronically signed: Dr. Kerney Elbe

## 2016-10-11 ENCOUNTER — Inpatient Hospital Stay (HOSPITAL_COMMUNITY): Payer: Medicare Other

## 2016-10-11 DIAGNOSIS — I6300 Cerebral infarction due to thrombosis of unspecified precerebral artery: Secondary | ICD-10-CM

## 2016-10-11 DIAGNOSIS — R1312 Dysphagia, oropharyngeal phase: Secondary | ICD-10-CM

## 2016-10-11 DIAGNOSIS — I639 Cerebral infarction, unspecified: Principal | ICD-10-CM

## 2016-10-11 LAB — CBC WITH DIFFERENTIAL/PLATELET
BASOS ABS: 0.1 10*3/uL (ref 0.0–0.1)
BASOS PCT: 1 %
EOS ABS: 0.2 10*3/uL (ref 0.0–0.7)
Eosinophils Relative: 4 %
HEMATOCRIT: 39.5 % (ref 36.0–46.0)
Hemoglobin: 13.2 g/dL (ref 12.0–15.0)
Lymphocytes Relative: 37 %
Lymphs Abs: 2.4 10*3/uL (ref 0.7–4.0)
MCH: 29.7 pg (ref 26.0–34.0)
MCHC: 33.4 g/dL (ref 30.0–36.0)
MCV: 89 fL (ref 78.0–100.0)
MONOS PCT: 8 %
Monocytes Absolute: 0.6 10*3/uL (ref 0.1–1.0)
NEUTROS ABS: 3.3 10*3/uL (ref 1.7–7.7)
NEUTROS PCT: 50 %
Platelets: 113 10*3/uL — ABNORMAL LOW (ref 150–400)
RBC: 4.44 MIL/uL (ref 3.87–5.11)
RDW: 13.8 % (ref 11.5–15.5)
WBC: 6.5 10*3/uL (ref 4.0–10.5)

## 2016-10-11 LAB — BASIC METABOLIC PANEL
ANION GAP: 12 (ref 5–15)
BUN: 12 mg/dL (ref 6–20)
CO2: 22 mmol/L (ref 22–32)
Calcium: 9 mg/dL (ref 8.9–10.3)
Chloride: 106 mmol/L (ref 101–111)
Creatinine, Ser: 0.75 mg/dL (ref 0.44–1.00)
Glucose, Bld: 89 mg/dL (ref 65–99)
Potassium: 4.7 mmol/L (ref 3.5–5.1)
SODIUM: 140 mmol/L (ref 135–145)

## 2016-10-11 LAB — GLUCOSE, CAPILLARY
GLUCOSE-CAPILLARY: 97 mg/dL (ref 65–99)
Glucose-Capillary: 93 mg/dL (ref 65–99)
Glucose-Capillary: 95 mg/dL (ref 65–99)
Glucose-Capillary: 120 mg/dL — ABNORMAL HIGH (ref 65–99)
Glucose-Capillary: 112 mg/dL — ABNORMAL HIGH (ref 65–99)

## 2016-10-11 MED ORDER — CLOPIDOGREL BISULFATE 75 MG PO TABS
75.0000 mg | ORAL_TABLET | Freq: Every day | ORAL | Status: DC
  Administered 2016-10-11 – 2016-10-12 (×2): 75 mg via ORAL
  Filled 2016-10-11 (×2): qty 1

## 2016-10-11 MED ORDER — ROSUVASTATIN CALCIUM 5 MG PO TABS
40.0000 mg | ORAL_TABLET | Freq: Every day | ORAL | Status: DC
  Administered 2016-10-11 – 2016-10-12 (×2): 40 mg via ORAL
  Filled 2016-10-11 (×2): qty 8

## 2016-10-11 MED ORDER — RESOURCE THICKENUP CLEAR PO POWD
ORAL | Status: DC | PRN
  Filled 2016-10-11: qty 125

## 2016-10-11 NOTE — Progress Notes (Signed)
STROKE TEAM PROGRESS NOTE   HISTORY OF PRESENT ILLNESS (per record) Susan Patel is a 76 y.o. female with a historyof previous stroke who was in her normal state of health at bedtime last night. Her daughter reported that she spoke with her at 10am and that she was in her normal state of health at that time. Subsequently she endorsed through head shaking that she was having more difficulty walking even on awakening, but this was after tpa.   She was speaking still and apparently normal enough at10am to be reported as normal. She went to breakfast after this and needed a little more help to get in her chair. She then was having some trouble swallowing and speaking and therefor 911 was called.    LKW: initially reported as 10am, but I suspect 5/5 prior to bed.  tpa given?: yes Saturday 10/09/2016 at 1415 ROS:  Unable to obtain due to altered mental status.     SUBJECTIVE (INTERVAL HISTORY) Patient's husband is at bedside.   She states she has improved significantly almost back to her baseline   OBJECTIVE Temp:  [98.2 F (36.8 C)-99.1 F (37.3 C)] 98.5 F (36.9 C) (05/28 0929) Pulse Rate:  [64-83] 74 (05/28 0929) Cardiac Rhythm: Normal sinus rhythm (05/28 0900) Resp:  [14-22] 17 (05/28 0929) BP: (149-176)/(52-81) 168/77 (05/28 0929) SpO2:  [97 %-100 %] 100 % (05/28 0929) Weight:  [194 lb 4.8 oz (88.1 kg)] 194 lb 4.8 oz (88.1 kg) (05/27 2200)  CBC:  Recent Labs Lab 10/09/16 1235  10/10/16 1108 10/11/16 0437  WBC 7.8  --  8.5 6.5  NEUTROABS 4.9  --   --  3.3  HGB 12.5  < > 11.7* 13.2  HCT 37.8  < > 36.0 39.5  MCV 87.3  --  86.5 89.0  PLT 286  --  317 113*  < > = values in this interval not displayed.  Basic Metabolic Panel:   Recent Labs Lab 10/09/16 1235 10/09/16 1242 10/11/16 0437  NA 139 137 140  K 3.5 3.4* 4.7  CL 99* 97* 106  CO2 29  --  22  GLUCOSE 112* 108* 89  BUN 29* 27* 12  CREATININE 0.97 1.00 0.75  CALCIUM 9.3  --  9.0    Lipid Panel:      Component Value Date/Time   CHOL 154 10/10/2016 0141   CHOL 175 09/15/2016 1723   TRIG 104 10/10/2016 0141   HDL 43 10/10/2016 0141   HDL 44 09/15/2016 1723   CHOLHDL 3.6 10/10/2016 0141   VLDL 21 10/10/2016 0141   LDLCALC 90 10/10/2016 0141   LDLCALC 97 09/15/2016 1723   HgbA1c:  Lab Results  Component Value Date   HGBA1C 6.6 (H) 03/21/2016   Urine Drug Screen:     Component Value Date/Time   LABOPIA NONE DETECTED 10/09/2016 1350   COCAINSCRNUR NONE DETECTED 10/09/2016 1350   LABBENZ NONE DETECTED 10/09/2016 1350   AMPHETMU NONE DETECTED 10/09/2016 1350   THCU NONE DETECTED 10/09/2016 1350   LABBARB NONE DETECTED 10/09/2016 1350    Alcohol Level     Component Value Date/Time   ETH <5 10/09/2016 1235    IMAGING  Ct Angio Head W/cm &/or Wo Cm Ct Angio Neck W And/or Wo Contrast 10/09/2016 1. No emergent large vessel occlusion.  2. Progressive, advanced intracranial atherosclerosis including severe stenoses involving both supraclinoid ICAs, MCA bifurcation regions, distal left vertebral artery, proximal PCAs, and numerous anterior and posterior circulation branch vessels. Right PCA branch  vessel occlusion corresponding to the previously described infarct.  3. No cervical carotid artery stenosis.     Ct Head Wo Contrast 10/10/2016 No intracranial hemorrhage or acute intracranial process. Stable age indeterminate basal ganglia and thalamus lacunar infarcts. Subacute to old RIGHT occipital lobe/ PCA territory infarct. Severe chronic small vessel ischemic disease.     Ct Head Code Stroke W/o Cm 10/09/2016 1. Suspected acute right basal ganglia infarct.  2. New left basal ganglia lacunar infarct, of indeterminate age.  3. New right occipital lobe infarct, possibly subacute.  4. Extensive chronic small vessel ischemic disease.  5. No acute intracranial hemorrhage.     Physical exam: Exam: Gen: Obese elderly Caucasian lady not in distress          CV: RRR, no MRG.  No Carotid Bruits. No peripheral edema, warm, nontender Eyes: Conjunctivae clear without exudates or hemorrhage  Neuro: Detailed Neurologic Exam  Speech:    Speech is clear without evidence of dysarthria or aphasia  Cognition:    The patient is oriented to person, month, year. Can follow  Commands well.    Cranial Nerves:    The pupils are equal, round, and reactive to light. Visual fields are full to finger confrontation. Extraocular movements are intact. Trigeminal sensation is intact and the muscles of mastication are normal. The face is symmetric. The palate elevates in the midline. Hearing intact. Voice is normal. Shoulder shrug is normal. The tongue has normal motion without fasciculations.   Coordination:    No dysmetria noted   Gait:    Heel-toe and tandem gait are normal.   Motor Observation:    no involuntary movements noted. Tone:    Normal muscle tone.     Strength:    Mild Left arm and leg weakness 4+/5. Right side intact.      Sensation: intact to LT       ASSESSMENT/PLAN [name] is a 76 y.o. female with history of previous stroke, hypertension, neuromuscular disorder, fibromyalgia, diabetes mellitus, COPD, chronic pain, neuropathy, and depression presenting with gait disturbance, occult swallowing, and speech difficulties.  The patient received TPA - Saturday 10/09/2016 at 1415   Stroke:  Right basal ganglia infarct. Likely due to small vessel disease.  Resultant  Left hemiparesis   CT head - multiple subacute to old infarcts.  MRI head - pending  MRA head - not performed  CTA H&N - Severe, progressive, advanced intracranial atherosclerosis as above.  Carotid Doppler - CTA neck 2D Echo -Left ventricle: The cavity size was normal. There was mild   concentric hypertrophy. Systolic function was normal. The   estimated ejection fraction was in the range of 60% to 65%. Wall   motion was normal; there were no regional wall motion     abnormalities  LDL - 90  HgbA1c - pending  VTE prophylaxis DIET - DYS 1 Room service appropriate? Yes; Fluid consistency: Honey Thick  aspirin 325 mg daily prior to admission, now on No antithrombotic secondary to TPA therapy.  Patient counseled to be compliant with her antithrombotic medications  Ongoing aggressive stroke risk factor management  Therapy recommendations:  pending  Disposition: Pending  Hypertension  Stable - avoid hypotension  Permissive hypertension (OK if < 220/120) but gradually normalize in 5-7 days  Long-term BP goal normotensive  Hyperlipidemia  Home meds:  Crestor 40 mg daily - not resumed in hospital  LDL 90, goal < 70  Resume Crestor 40 mg daily (max dose) when PO access  is available.  Continue statin at discharge  Diabetes  HgbA1c pending, goal < 7.0  Unc / Controlled  Other Stroke Risk Factors  Advanced age  Obesity, Body mass index is 35.54 kg/m., recommend weight loss, diet and exercise as appropriate   Hx stroke/TIA   Other Active Problems  Mild hypokalemia - 3.4 - supplement - recheck Tuesday.  Possible aspiration per nursing - CXR and CBC (afebrile - temp 99.9 yesterday)  Hospital day # 2  Personally examined patient and images, and have participated in and made any corrections needed to history, physical, neuro exam,assessment and plan as stated above.  I have personally obtained the history, evaluated lab date, reviewed imaging studies and agree with radiology interpretations.  Plan the level out of bed. Physical occupational therapy and rehabilitation consults. Transfer to inpatient rehabilitation over the next few days when bed available. Change aspirin to Plavix for secondary stroke prevention. Patient's LDL cholesterol is yet suboptimal despite maximum dose of Crestor and she may benefit with addition of new PCS 9 inhibitor injections as an outpatient. Discussed with patient and husband and answered questions. Greater  than 50% time during this 35 minute visit was spent on counseling and coordination of care about her stroke, discussion about prevention and treatment options and answered questions  Antony Contras,, MD Stroke Neurology   To contact Stroke Continuity provider, please refer to [email or web address] After hours, contact General Neurology

## 2016-10-11 NOTE — Consult Note (Signed)
Physical Medicine and Rehabilitation Consult   Reason for Consult:  Stroke with left sided weakness, severe dysarthria, expressive aphasia and dysphagia.  Referring Physician: Dr. Leonie Man.    HPI: Susan Patel is a 76 y.o. female COPD- oxygen dependent, T2DM, Neuropathy, chronic pain, CVA with residual visual deficits?, Parkinsonism; who was admitted on 10/09/16 with inability to speak, left sided weakness and difficulty walking. CT head done revealing suspicion of acute right basal infarct, subacute right occipital infarct, left basal infarct question age and extensive small vessel disease. She receive tPA and continues on ASA for secondary stroke prevention.  MRI brain done revealing acute infracts right basal ganglia nd anteiro right parietal white matter with chronic right occipital cortical infarct. CTA head/neck showed severe progressive advanced intracranial atherosclerosis.  2 D echo revealed EF 60-65%, with grade one diastolic dysfunction, severe aspiration risk--NPO. She reported severe HA yesterday and repeat CT head without acute changes.  Patient with sever dysarthria, severe expressive deficits, poor safety as well as decrease in mobility and ability to carry out ADL tasks. CIR recommended by rehab team.    Review of Systems  Constitutional: Negative for fever.  HENT: Negative for hearing loss.   Eyes: Negative for blurred vision.  Respiratory: Negative for cough.   Cardiovascular: Negative for chest pain.  Gastrointestinal: Negative for heartburn.  Genitourinary: Negative for dysuria.  Musculoskeletal: Negative for myalgias.  Skin: Negative for rash.  Neurological: Negative for dizziness.  Psychiatric/Behavioral: Negative for depression.      Past Medical History:  Diagnosis Date  . Arthritis    'all over"  . Asthma   . Chronic pain   . COPD (chronic obstructive pulmonary disease) (HCC)    O2 per [place] at nights   . Depression   . Diabetes mellitus without  complication ([place])    borderline- , states she was on med., but MD told her "everything is under control so I threw the bottle away"  . Fibromyalgia   . GERD (gastroesophageal reflux disease)   . Hypertension   . Neuromuscular disorder (HCC)    parkinson, neuropathy- both feet & hands.  . Stroke [facility])    residual dysarthria    Past Surgical History:  Procedure Laterality Date  . ABDOMINAL HYSTERECTOMY    . ANTERIOR CERVICAL DECOMP/DISCECTOMY FUSION N/A 07/10/2015   Procedure: Cervical five-six, Cervical six-seven anterior cervical decompression with fusion plating and bonegraft;  Surgeon: Jovita Gamma, MD;  Location: [facility];  Service: Neurosurgery;  Laterality: N/A;  . APPENDECTOMY    . BIOPSY EYE MUSCLE  03/20/2016   biopsy vessel to right eye due to swelling  . EYE SURGERY Bilateral    cataracts removed, /w "cyrstal lenses"   . SHOULDER ARTHROSCOPY Right    x2   RCR- spurs removed     Family History  Problem Relation Age of Onset  . Heart disease Mother   . Cancer Father   . Heart disease Father     Social History:  reports that she has never smoked. She has never used smokeless tobacco. She reports that she does not drink alcohol or use drugs.    Allergies  Allergen Reactions  . Azithromycin Other (See Comments)    Renal failure  . Prednisone Other (See Comments)    Irritability and insomnia  . Sulfamethoxazole-Trimethoprim Other (See Comments)    Renal failure  . Amlodipine     Caused swelling in legs  . Ciprofloxacin Itching and Rash  . Tape  Rash    Medications Prior to Admission  Medication Sig Dispense Refill  . aspirin 325 MG EC tablet Take 1 tablet (325 mg total) by mouth daily. 90 tablet 1  . cetirizine (ZYRTEC) 10 MG tablet Take 10 mg by mouth daily.    . DULoxetine (CYMBALTA) 30 MG capsule Take 2 capsules (60 mg total) by mouth daily. (Patient taking differently: Take 60 mg by mouth 2 (two) times daily. ) 60 capsule 3  . esomeprazole (NEXIUM)  20 MG capsule Take 20 mg by mouth daily.    . fluticasone (FLONASE) 50 MCG/ACT nasal spray Place 2 sprays into both nostrils daily. (Patient taking differently: Place 2 sprays into both nostrils daily as needed for allergies. ) 54 g 0  . fluticasone furoate-vilanterol (BREO ELLIPTA) 200-25 MCG/INH AEPB Inhale 1 puff into the lungs daily.    Marland Kitchen gabapentin (NEURONTIN) 300 MG capsule Take 1 capsule (300 mg total) by mouth at bedtime. 30 capsule 3  . glimepiride (AMARYL) 2 MG tablet TAKE 1 TABLET (2 MG TOTAL) BY MOUTH DAILY BEFORE BREAKFAST. 90 tablet 0  . latanoprost (XALATAN) 0.005 % ophthalmic solution Place 1 drop into both eyes at bedtime. 2.5 mL 12  . linaclotide (LINZESS) 72 MCG capsule Take 1 capsule (72 mcg total) by mouth daily before breakfast. 90 capsule 0  . losartan-hydrochlorothiazide (HYZAAR) 100-25 MG tablet Take 1 tablet by mouth daily. 90 tablet 3  . montelukast (SINGULAIR) 10 MG tablet TAKE 1 TABLET (10 MG TOTAL) BY MOUTH AT BEDTIME. 30 tablet 9  . OXYGEN Inhale 3-4 L into the lungs at bedtime.    . rosuvastatin (CRESTOR) 40 MG tablet Take 1 tablet (40 mg total) by mouth every evening. 90 tablet 0  . traMADol (ULTRAM) 50 MG tablet Take 1 tablet (50 mg total) by mouth every 8 (eight) hours as needed for moderate pain or severe pain. (Patient taking differently: Take 50 mg by mouth 2 (two) times daily. ) 90 tablet 2  . traZODone (DESYREL) 50 MG tablet TAKE 1 TABLET (50 MG TOTAL) BY MOUTH NIGHTLY AS NEEDED FOR FOR SLEEP. 30 tablet 0  . Vitamin D, Ergocalciferol, (DRISDOL) 50000 units CAPS capsule Take 50,000 Units by mouth every Monday.   3  . acetaminophen (TYLENOL) 500 MG tablet Take 1,000 mg by mouth every 8 (eight) hours as needed for mild pain or moderate pain.    Marland Kitchen albuterol (PROVENTIL HFA;VENTOLIN HFA) 108 (90 Base) MCG/ACT inhaler Inhale 1-2 puffs into the lungs every 6 (six) hours as needed for wheezing or shortness of breath. 1 Inhaler 1  . triamcinolone (KENALOG) 0.025 %  ointment Apply 1 application topically 2 (two) times daily. (Patient taking differently: Apply 1 application topically 2 (two) times daily as needed. ) 30 g 1    Home: Home Living Family/patient expects to be discharged to:: Private residence Living Arrangements: Spouse/significant other Available Help at Discharge: Available 24 hours/day Type of Home: House Home Access: Ramped entrance, Stairs to enter [employer] of Steps: 4 Entrance Stairs-Rails: Right Home Layout: One level, Able to live on main level with bedroom/bathroom (has chair lift for upstairs) Bathroom Shower/Tub: Other (comment) (safe step walk in tub) Bathroom Toilet: Standard Bathroom Accessibility: Yes Home Equipment: Shower seat - built in, [employer] - tub/shower, [employer] - single point, Bedside commode  Functional History: Prior Function Level of Independence: Independent Functional Status:  Mobility: Bed Mobility Overal bed mobility: Needs Assistance Bed Mobility: Supine to Sit Supine to sit: Mod assist General bed mobility  comments: Pt required vc to problem solve how to roll to side before pushing up then mod A to help transition trunk from sidelying to sit Transfers Overall transfer level: Needs assistance Transfers: Sit to/from Stand, Stand Pivot Transfers Sit to Stand: Min assist Stand pivot transfers: Mod assist      ADL: ADL Overall ADL's : Needs assistance/impaired Eating/Feeding: NPO Grooming: Minimal assistance, Sitting Upper Body Bathing: Minimal assistance, Sitting Lower Body Bathing: Moderate assistance, Sit to/from stand Upper Body Dressing : Minimal assistance, Sitting Lower Body Dressing: Maximal assistance, Sit to/from stand Toilet Transfer: Moderate assistance, BSC, Stand-pivot Toileting- Clothing Manipulation and Hygiene: Moderate assistance, Sit to/from stand Toileting - Clothing Manipulation Details (indicate cue type and reason): using L hand for pericare Functional  mobility during ADLs: Moderate assistance  Cognition: Cognition Overall Cognitive Status: Impaired/Different from baseline Arousal/Alertness: Awake/alert Orientation Level: Oriented X4 Attention: Sustained, Selective Sustained Attention: Impaired Sustained Attention Impairment: Verbal basic, Functional basic Selective Attention: Impaired Selective Attention Impairment: Verbal basic, Functional basic Cognition Arousal/Alertness: Awake/alert Behavior During Therapy: Flat affect Overall Cognitive Status: Impaired/Different from baseline Area of Impairment: Attention, Memory, Safety/judgement, Awareness, Problem solving Current Attention Level: Sustained Memory: Decreased short-term memory Safety/Judgement: Decreased awareness of safety, Decreased awareness of deficits Awareness: Emergent Problem Solving: Slow processing General Comments: will further assess   Blood pressure (!) 168/77, pulse 74, temperature 98.5 F (36.9 C), temperature source Oral, resp. rate 17, height 5\' 2"  (1.575 m), weight 88.1 kg (194 lb 4.8 oz), SpO2 100 %. Physical Exam  Constitutional: She appears well-developed.  HENT:  Head: Normocephalic.  Eyes: EOM are normal.  Neck: No thyromegaly present.  Cardiovascular: Normal rate.   Respiratory: Effort normal.  GI: Soft.  Neurological:  Pt alert. Right facial weakness. Decreased cough. Speech slurred. Has some motor apraxia as well. UE 4/5 prox to distal with slightly less strength on left. LE: 4/5 prox to distal. Sensation intact  Psychiatric: She has a normal mood and affect. Her behavior is normal.    Results for orders placed or performed during the hospital encounter of 10/09/16 (from the past 24 hour(s))  CBC     Status: Abnormal   Collection Time: 10/10/16 11:08 AM  Result Value Ref Range   WBC 8.5 4.0 - 10.5 K/uL   RBC 4.16 3.87 - 5.11 MIL/uL   Hemoglobin 11.7 (L) 12.0 - 15.0 g/dL   HCT 36.0 36.0 - 46.0 %   MCV 86.5 78.0 - 100.0 fL   MCH 28.1  26.0 - 34.0 pg   MCHC 32.5 30.0 - 36.0 g/dL   RDW 13.5 11.5 - 15.5 %   Platelets 317 150 - 400 K/uL  Glucose, capillary     Status: Abnormal   Collection Time: 10/10/16 11:10 AM  Result Value Ref Range   Glucose-Capillary 116 (H) 65 - 99 mg/dL  Glucose, capillary     Status: None   Collection Time: 10/10/16  3:32 PM  Result Value Ref Range   Glucose-Capillary 88 65 - 99 mg/dL  Glucose, capillary     Status: None   Collection Time: 10/10/16  7:19 PM  Result Value Ref Range   Glucose-Capillary 85 65 - 99 mg/dL  Glucose, capillary     Status: None   Collection Time: 10/10/16 11:11 PM  Result Value Ref Range   Glucose-Capillary 82 65 - 99 mg/dL   Comment 1 Notify RN    Comment 2 Document in Chart   Glucose, capillary     Status: None   Collection  Time: 10/11/16  4:27 AM  Result Value Ref Range   Glucose-Capillary 97 65 - 99 mg/dL   Comment 1 Notify RN    Comment 2 Document in Chart   CBC with Differential/Platelet     Status: Abnormal   Collection Time: 10/11/16  4:37 AM  Result Value Ref Range   WBC 6.5 4.0 - 10.5 K/uL   RBC 4.44 3.87 - 5.11 MIL/uL   Hemoglobin 13.2 12.0 - 15.0 g/dL   HCT 39.5 36.0 - 46.0 %   MCV 89.0 78.0 - 100.0 fL   MCH 29.7 26.0 - 34.0 pg   MCHC 33.4 30.0 - 36.0 g/dL   RDW 13.8 11.5 - 15.5 %   Platelets 113 (L) 150 - 400 K/uL   Neutrophils Relative % 50 %   Neutro Abs 3.3 1.7 - 7.7 K/uL   Lymphocytes Relative 37 %   Lymphs Abs 2.4 0.7 - 4.0 K/uL   Monocytes Relative 8 %   Monocytes Absolute 0.6 0.1 - 1.0 K/uL   Eosinophils Relative 4 %   Eosinophils Absolute 0.2 0.0 - 0.7 K/uL   Basophils Relative 1 %   Basophils Absolute 0.1 0.0 - 0.1 K/uL  Basic metabolic panel     Status: None   Collection Time: 10/11/16  4:37 AM  Result Value Ref Range   Sodium 140 135 - 145 mmol/L   Potassium 4.7 3.5 - 5.1 mmol/L   Chloride 106 101 - 111 mmol/L   CO2 22 22 - 32 mmol/L   Glucose, Bld 89 65 - 99 mg/dL   BUN 12 6 - 20 mg/dL   Creatinine, Ser 0.75 0.44 -  1.00 mg/dL   Calcium 9.0 8.9 - 10.3 mg/dL   GFR calc non Af Amer >60 >60 mL/min   GFR calc Af Amer >60 >60 mL/min   Anion gap 12 5 - 15  Glucose, capillary     Status: None   Collection Time: 10/11/16  7:45 AM  Result Value Ref Range   Glucose-Capillary 93 65 - 99 mg/dL   Comment 1 Notify RN    Comment 2 Document in Chart    Ct Angio Head W/cm &/or Wo Cm  Result Date: 10/09/2016 CLINICAL DATA:  Aphasia.  Facial droop. EXAM: CT ANGIOGRAPHY HEAD AND NECK TECHNIQUE: Multidetector CT imaging of the head and neck was performed using the standard protocol during bolus administration of intravenous contrast. Multiplanar CT image reconstructions and MIPs were obtained to evaluate the vascular anatomy. Carotid stenosis measurements (when applicable) are obtained utilizing NASCET criteria, using the distal internal carotid diameter as the denominator. CONTRAST:  80 mL Isovue 370 COMPARISON:  Noncontrast head CT earlier today. Head MRI/ MRA 03/21/2016. FINDINGS: CTA NECK FINDINGS Aortic arch: Standard 3 vessel aortic arch. Widely patent brachiocephalic and subclavian arteries. Right carotid system: Patent without evidence of stenosis or dissection. Left carotid system: Patent without evidence of stenosis or dissection. Mild calcified and soft plaque in the proximal ICA. Vertebral arteries: Patent and codominant without definite stenosis or dissection, although refluxed venous contrast limits assessment of the right V1 segment. Skeleton: C5-C7 ACDF.  Moderate cervical facet arthrosis. Other neck: No mass or lymph node enlargement. Upper chest: Mild motion artifact and atelectasis in the visualized lungs. Review of the MIP images confirms the above findings CTA HEAD FINDINGS Anterior circulation: The internal carotid arteries are patent from skullbase to carotid termini. There are progressive, now severe stenoses of both supraclinoid segments. The M1 segments are patent without significant proximal  stenosis,  however there are severe stenoses involving both MCA bifurcations and multiple M2 and more distal branches. ACAs are patent without significant A1 stenosis, although there are severe A2 and more distal branch vessel stenoses. No aneurysm. Posterior circulation: The intracranial vertebral arteries are patent to the basilar. V4 segment atherosclerosis results in mild distal narrowing on the right but progressive, severe stenosis on the left. There is new mild stenosis of the midbasilar artery. There are severe tandem stenoses involving the P1 and P2 segments bilaterally including a persistent high-grade stenosis or segmental occlusion of the mid right P2 segment with decreased number of distal right PCA branch vessels. No aneurysm. Venous sinuses: Patent. Anatomic variants: None. Delayed phase: No abnormal enhancement. Review of the MIP images confirms the above findings IMPRESSION: 1. No emergent large vessel occlusion. 2. Progressive, advanced intracranial atherosclerosis including severe stenoses involving both supraclinoid ICAs, MCA bifurcation regions, distal left vertebral artery, proximal PCAs, and numerous anterior and posterior circulation branch vessels. Right PCA branch vessel occlusion corresponding to the previously described infarct. 3. No cervical carotid artery stenosis. These results were called by telephone at the time of interpretation on 10/09/2016 at 2:30 pm to Dr. Leonel Ramsay, who verbally acknowledged these results. Electronically Signed   By: Logan Bores M.D.   On: 10/09/2016 14:51   Ct Head Wo Contrast  Result Date: 10/10/2016 CLINICAL DATA:  Acute onset headache after tPA. History of hypertension, diabetes. EXAM: CT HEAD WITHOUT CONTRAST TECHNIQUE: Contiguous axial images were obtained from the base of the skull through the vertex without intravenous contrast. COMPARISON:  CT HEAD Oct 09, 2016 FINDINGS: BRAIN: No intraparenchymal hemorrhage, mass effect nor midline shift. No acute large  vascular territory infarct. Bilateral basal ganglia and thalamus lacunar infarcts. RIGHT mesial occipital lobe encephalomalacia. Confluent supratentorial white matter hypodensities. No abnormal extra-axial fluid collections. Basal cisterns are patent. VASCULAR: Moderate calcific atherosclerosis of the carotid siphons. SKULL: No skull fracture. No significant scalp soft tissue swelling. SINUSES/ORBITS: Trace LEFT mastoid effusion. Paranasal sinus are well aerated. The included ocular globes and orbital contents are non-suspicious. Status post bilateral ocular lens implants. OTHER: None. IMPRESSION: No intracranial hemorrhage or acute intracranial process. Stable age indeterminate basal ganglia and thalamus lacunar infarcts. Subacute to old RIGHT occipital lobe/ PCA territory infarct. Severe chronic small vessel ischemic disease. Electronically Signed   By: Elon Alas M.D.   On: 10/10/2016 01:30   Ct Angio Neck W And/or Wo Contrast  Result Date: 10/09/2016 CLINICAL DATA:  Aphasia.  Facial droop. EXAM: CT ANGIOGRAPHY HEAD AND NECK TECHNIQUE: Multidetector CT imaging of the head and neck was performed using the standard protocol during bolus administration of intravenous contrast. Multiplanar CT image reconstructions and MIPs were obtained to evaluate the vascular anatomy. Carotid stenosis measurements (when applicable) are obtained utilizing NASCET criteria, using the distal internal carotid diameter as the denominator. CONTRAST:  80 mL Isovue 370 COMPARISON:  Noncontrast head CT earlier today. Head MRI/ MRA 03/21/2016. FINDINGS: CTA NECK FINDINGS Aortic arch: Standard 3 vessel aortic arch. Widely patent brachiocephalic and subclavian arteries. Right carotid system: Patent without evidence of stenosis or dissection. Left carotid system: Patent without evidence of stenosis or dissection. Mild calcified and soft plaque in the proximal ICA. Vertebral arteries: Patent and codominant without definite stenosis or  dissection, although refluxed venous contrast limits assessment of the right V1 segment. Skeleton: C5-C7 ACDF.  Moderate cervical facet arthrosis. Other neck: No mass or lymph node enlargement. Upper chest: Mild motion artifact and atelectasis in the visualized  lungs. Review of the MIP images confirms the above findings CTA HEAD FINDINGS Anterior circulation: The internal carotid arteries are patent from skullbase to carotid termini. There are progressive, now severe stenoses of both supraclinoid segments. The M1 segments are patent without significant proximal stenosis, however there are severe stenoses involving both MCA bifurcations and multiple M2 and more distal branches. ACAs are patent without significant A1 stenosis, although there are severe A2 and more distal branch vessel stenoses. No aneurysm. Posterior circulation: The intracranial vertebral arteries are patent to the basilar. V4 segment atherosclerosis results in mild distal narrowing on the right but progressive, severe stenosis on the left. There is new mild stenosis of the midbasilar artery. There are severe tandem stenoses involving the P1 and P2 segments bilaterally including a persistent high-grade stenosis or segmental occlusion of the mid right P2 segment with decreased number of distal right PCA branch vessels. No aneurysm. Venous sinuses: Patent. Anatomic variants: None. Delayed phase: No abnormal enhancement. Review of the MIP images confirms the above findings IMPRESSION: 1. No emergent large vessel occlusion. 2. Progressive, advanced intracranial atherosclerosis including severe stenoses involving both supraclinoid ICAs, MCA bifurcation regions, distal left vertebral artery, proximal PCAs, and numerous anterior and posterior circulation branch vessels. Right PCA branch vessel occlusion corresponding to the previously described infarct. 3. No cervical carotid artery stenosis. These results were called by telephone at the time of  interpretation on 10/09/2016 at 2:30 pm to Dr. Leonel Ramsay, who verbally acknowledged these results. Electronically Signed   By: Logan Bores M.D.   On: 10/09/2016 14:51   Mr Brain Wo Contrast  Result Date: 10/10/2016 CLINICAL DATA:  Expressive aphasia.  Left hemiparesis. EXAM: MRI HEAD WITHOUT CONTRAST TECHNIQUE: Multiplanar, multiecho pulse sequences of the brain and surrounding structures were obtained without intravenous contrast. COMPARISON:  Head CT 10/10/2016 and MRI 03/21/2016 FINDINGS: Brain: There is an acute right lateral lenticulostriate territory infarct involving the posterior lentiform nucleus and a portion of the corona radiata corresponding to the infarct described on recent CT. Additionally, there is a 2 cm region of acute to early subacute infarction more superiorly and posteriorly involving right parietal white matter. The small right occipital infarct is chronic though new from 03/2016. There also chronic lacunar infarcts in the basal ganglia bilaterally, left thalamus, deep cerebral white matter bilaterally, and anterior corpus callosum. Confluent cerebral white matter T2 hyperintensities are compatible with extensive chronic small vessel ischemic disease. Milder chronic small vessel ischemia is again noted in the pons. No intracranial hemorrhage, mass, midline shift, or extra-axial fluid collection is identified. There is moderate cerebral atrophy. Vascular: Major intracranial vascular flow voids are preserved. Skull and upper cervical spine: Unremarkable bone marrow signal. Sinuses/Orbits: Bilateral cataract extraction. Clear paranasal sinuses. Trace left mastoid effusion. Other: None. IMPRESSION: 1. Acute infarcts in the right basal ganglia and anterior right parietal white matter. 2. Chronic right occipital cortical infarct and extensive chronic small vessel ischemic changes. Electronically Signed   By: Logan Bores M.D.   On: 10/10/2016 14:33   Dg Chest Port 1 View  Result Date:  10/10/2016 CLINICAL DATA:  Cough. EXAM: PORTABLE CHEST 1 VIEW COMPARISON:  11/06/2015 FINDINGS: The heart size and mediastinal contours are within normal limits. Both lungs are clear. The visualized skeletal structures are unremarkable. IMPRESSION: No active disease. Electronically Signed   By: Kerby Moors M.D.   On: 10/10/2016 11:35   Ct Head Code Stroke W/o Cm  Result Date: 10/09/2016 CLINICAL DATA:  Code stroke. Aphasia. Possible left facial droop  and left lower extremity weakness. Right lower extremity weakness as well which may be chronic. EXAM: CT HEAD WITHOUT CONTRAST TECHNIQUE: Contiguous axial images were obtained from the base of the skull through the vertex without intravenous contrast. COMPARISON:  Head CT 03/20/2016 and MRI 03/21/2016 FINDINGS: Brain: A chronic infarct in the left internal capsule was acute on the prior MRI. Chronic lacunar infarcts are again seen in the right lentiform nucleus, periventricular white matter, corpus callosum, and left thalamus. There is a new small right occipital lobe infarct which may be subacute. There is also a new 1 cm infarct in the right lentiform nucleus which may be acute or subacute. A new 5 mm infarct in the left lentiform nucleus is also of indeterminate age. No acute intracranial hemorrhage, mass, midline shift, or extra-axial fluid collection is identified. Cerebral atrophy is unchanged. Confluent cerebral white matter hypodensities are compatible with extensive chronic small vessel ischemia. Vascular: Calcified atherosclerosis at the skullbase. No hyperdense vessel. Skull: No fracture or focal osseous lesion. Sinuses/Orbits: Visualized paranasal sinuses and mastoid air cells are clear. Bilateral cataract extraction. Other: None. ASPECTS [facility] Stroke Program Early CT Score) Not scored due to extensive chronic ischemic changes and difficulty with symptom localization on physical examination. IMPRESSION: 1. Suspected acute right basal ganglia  infarct. 2. New left basal ganglia lacunar infarct, of indeterminate age. 3. New right occipital lobe infarct, possibly subacute. 4. Extensive chronic small vessel ischemic disease. 5. No acute intracranial hemorrhage. These results were called by telephone at the time of interpretation on 10/09/2016 at 12:57 pm to Dr. Francine Graven , who verbally acknowledged these results. Electronically Signed   By: Logan Bores M.D.   On: 10/09/2016 13:03    Assessment/Plan: Diagnosis: hx of left IC infarct now with acute right BG/occipital infarcts with left hemiparesis and dysphagia 1. Does the need for close, 24 hr/day medical supervision in concert with the patient's rehab needs make it unreasonable for this patient to be served in a less intensive setting? Yes 2. Co-Morbidities requiring supervision/potential complications: dysphagia, dm2, obesity, fibromyalgia 3. Due to bladder management, bowel management, safety, skin/wound care, disease management, medication administration, pain management and patient education, does the patient require 24 hr/day rehab nursing? Yes 4. Does the patient require coordinated care of a physician, rehab nurse, PT (1-2 hrs/day, 5 days/week), OT (1-2 hrs/day, 5 days/week) and SLP (1-2 hrs/day, 5 days/week) to address physical and functional deficits in the context of the above medical diagnosis(es)? Yes Addressing deficits in the following areas: balance, endurance, locomotion, strength, transferring, bowel/bladder control, bathing, dressing, feeding, grooming, toileting, cognition, speech, swallowing and psychosocial support 5. Can the patient actively participate in an intensive therapy program of at least 3 hrs of therapy per day at least 5 days per week? Yes 6. The potential for patient to make measurable gains while on inpatient rehab is excellent 7. Anticipated functional outcomes upon discharge from inpatient rehab are modified independent  with PT, modified independent  and supervision with OT, modified independent, supervision and min assist with SLP. 8. Estimated rehab length of stay to reach the above functional goals is: 7 days 9. Anticipated D/C setting: Home 10. Anticipated post D/C treatments: HH therapy and Outpatient therapy 11. Overall Rehab/Functional Prognosis: excellent  RECOMMENDATIONS: This patient's condition is appropriate for continued rehabilitative care in the following setting: CIR Patient has agreed to participate in recommended program. Yes Note that insurance prior authorization may be required for reimbursement for recommended care.  Comment: Rehab Admissions Coordinator to follow  up.  Thanks,  Meredith Staggers, MD, [place], [place] 10/11/2016

## 2016-10-11 NOTE — Progress Notes (Signed)
Pt requests tramadol and sleep aid which she takes at home. Dr. Leonie Man paged x2 with no new order.

## 2016-10-11 NOTE — Progress Notes (Signed)
Occupational Therapy Evaluation Patient Details Name: Susan Patel MRN: [id number] DOB: 1940/06/27 Today's Date: 10/11/2016    History of Present Illness 76 yo with PMH significant for HTN, DM, fibropmyalgia, previous CVA (L internal capsule) with new onset of L sided weakness and increased difficulty speaking. +tPA. Pt with new L BG lacunar infarct of indeterminant age New R occipital infarct possibly acute and acute R BG infarct.    Clinical Impression   PTA, pt had returned to modified independent level without an AD with mobility and ADL. Pt was "cooking and doing what she wanted to do". Pt presents with functional status change and currently requires mod A with mobility and ADL and would benefit from intensive rehab at CIR to facilitate return to PLOF. Very supportive family who can provide 24/7 A after DC. Will follow acutely to maximize functional level of independence and address established goals. Pt very appreciative.     Follow Up Recommendations  CIR;Supervision/Assistance - 24 hour    Equipment Recommendations  None recommended by OT    Recommendations for Other Services Rehab consult     Precautions / Restrictions Precautions Precautions: Fall Restrictions Weight Bearing Restrictions: No      Mobility Bed Mobility Overal bed mobility: Needs Assistance Bed Mobility: Supine to Sit     Supine to sit: Mod assist     General bed mobility comments: Pt required vc to problem solve how to roll to side before pushing up then mod A to help transition trunk from sidelying to sit  Transfers Overall transfer level: Needs assistance   Transfers: Sit to/from Stand;Stand Pivot Transfers Sit to Stand: Min assist Stand pivot transfers: Mod assist  posterior bias          Balance Overall balance assessment: Needs assistance   Sitting balance-Leahy Scale: Fair       Standing balance-Leahy Scale: Poor      leans posteriorly in standing.                         ADL either performed or assessed with clinical judgement   ADL Overall ADL's : Needs assistance/impaired Eating/Feeding: NPO  At baseline, has difficulty managing saliva   Grooming: Minimal assistance;Sitting   Upper Body Bathing: Minimal assistance;Sitting   Lower Body Bathing: Moderate assistance;Sit to/from stand   Upper Body Dressing : Minimal assistance;Sitting   Lower Body Dressing: Maximal assistance;Sit to/from stand   Toilet Transfer: Moderate assistance;BSC;Stand-pivot   Toileting- Clothing Manipulation and Hygiene: Moderate assistance;Sit to/from stand Toileting - Clothing Manipulation Details (indicate cue type and reason): using L hand for pericare     Functional mobility during ADLs: Moderate assistance       Vision   Vision Assessment?: Yes Eye Alignment: Within Functional Limits Ocular Range of Motion: Within Functional Limits Alignment/Gaze Preference: Within Defined Limits Tracking/Visual Pursuits: Decreased smoothness of horizontal tracking;Decreased smoothness of vertical tracking Saccades: Decreased speed of saccadic movement;Impaired - to be further tested in functional context Convergence: Within functional limits Visual Fields: Impaired-to be further tested in functional context Additional Comments: At baseline, husband states pt has visual deficits, but unable to give detail     Perception Perception Spatial deficits: appears to demoonstrate impaired sense of midline orientation Comments: ? L inattention   Praxis Praxis Praxis-Other Comments: will further assess    Pertinent Vitals/Pain Pain Assessment: 0-10 Pain Score: 0-No pain     Hand Dominance Right   Extremity/Trunk Assessment Upper Extremity Assessment Upper Extremity Assessment: RUE  deficits/detail;LUE deficits/detail RUE Deficits / Details: using functionally. slower movemetns; per husband, hx of R shoulder pain. not painful on eval LUE Deficits / Details:  generalized weakness but AROM WFL for ADL. slower movements but using funcitonally LUE Coordination: decreased fine motor   Lower Extremity Assessment Lower Extremity Assessment: LLE deficits/detail;Defer to PT evaluation LLE Deficits / Details: LLE appears weaker than R. More difficulty with heel to shin   Cervical / Trunk Assessment Cervical / Trunk Assessment: Other exceptions Cervical / Trunk Exceptions: posterior bias   Communication Communication Communication: Expressive difficulties   Cognition Arousal/Alertness: Awake/alert Behavior During Therapy: Flat affect   Area of Impairment: Attention;Memory;Safety/judgement;Awareness;Problem solving                   Current Attention Level: Sustained Memory: Decreased short-term memory   Safety/Judgement: Decreased awareness of safety;Decreased awareness of deficits Awareness: Emergent Problem Solving: Slow processing General Comments: will further assess   General Comments       Exercises     Shoulder Instructions      Home Living Family/patient expects to be discharged to:: Private residence Living Arrangements: Spouse/significant other Available Help at Discharge: Available 24 hours/day Type of Home: House Home Access: Ramped entrance;Stairs to enter Entrance Stairs-Number of Steps: 4 Entrance Stairs-Rails: Right Home Layout: One level;Able to live on main level with bedroom/bathroom (has chair lift for upstairs)     Bathroom Shower/Tub: Other (comment) (safe step walk in tub)   Bathroom Toilet: Standard Bathroom Accessibility: Yes How Accessible: Accessible via walker Home Equipment: Shower seat - built in;Grab bars - tub/shower;Cane - single point;Bedside commode          Prior Functioning/Environment Level of Independence: Independent                 OT Problem List: Decreased strength;Decreased activity tolerance;Impaired balance (sitting and/or standing);Impaired  vision/perception;Decreased coordination;Decreased cognition;Decreased safety awareness;Decreased knowledge of use of DME or AE;Obesity;Impaired UE functional use      OT Treatment/Interventions: Self-care/ADL training;Therapeutic exercise;Neuromuscular education;DME and/or AE instruction;Therapeutic activities;Cognitive remediation/compensation;Visual/perceptual remediation/compensation;Patient/family education;Balance training    OT Goals(Current goals can be found in the care plan section) Acute Rehab OT Goals Patient Stated Goal: to get better OT Goal Formulation: With patient Time For Goal Achievement: 10/25/16 Potential to Achieve Goals: Good ADL Goals Pt Will Perform Grooming: with set-up;standing Pt Will Perform Upper Body Bathing: with set-up;sitting Pt Will Perform Lower Body Bathing: with set-up;sit to/from stand Pt Will Transfer to Toilet: with supervision;ambulating;bedside commode Pt Will Perform Toileting - Clothing Manipulation and hygiene: with supervision;sit to/from stand  OT Frequency: Min 2X/week   Barriers to D/C:            Co-evaluation              AM-PAC PT "6 Clicks" Daily Activity     Outcome Measure Help from another person eating meals?: Total Help from another person taking care of personal grooming?: A Little Help from another person toileting, which includes using toliet, bedpan, or urinal?: A Lot Help from another person bathing (including washing, rinsing, drying)?: A Lot Help from another person to put on and taking off regular upper body clothing?: A Little Help from another person to put on and taking off regular lower body clothing?: A Lot 6 Click Score: 13   End of Session Equipment Utilized During Treatment: Gait belt Nurse Communication: Mobility status  Activity Tolerance: Patient tolerated treatment well Patient left: in chair;with call bell/phone within reach;with chair alarm set;with family/visitor  present  OT Visit  Diagnosis: Unsteadiness on feet (R26.81);Low vision, both eyes (H54.2);Muscle weakness (generalized) (M62.81);Cognitive communication deficit (R41.841) Symptoms and signs involving cognitive functions: Cerebral infarction                Time: 1610-9604 OT Time Calculation (min): 35 min Charges:  OT General Charges $OT Visit: 1 Procedure OT Evaluation $OT Eval Moderate Complexity: 1 Procedure OT Treatments $Self Care/Home Management : 8-22 mins G-Codes:     [facility], OT/L  [phone] 10/11/2016  Meka Lewan,[place] 10/11/2016, 9:32 AM

## 2016-10-11 NOTE — Progress Notes (Signed)
Rehab Admissions Coordinator Note:  Patient was screened by Cleatrice Burke for appropriateness for an Inpatient Acute Rehab Consult per OT recommendation. Previous CIR admit 03/2016. At this time, we are recommending Inpatient Rehab consult.  Cleatrice Burke 10/11/2016, 10:04 AM  I can be reached at [phone].

## 2016-10-11 NOTE — Progress Notes (Signed)
Modified Barium Swallow Progress Note  Patient Details  Name: Susan Patel MRN: [id number] Date of Birth: 05/03/1941  Today's Date: 10/11/2016  Modified Barium Swallow completed.  Full report located under Chart Review in the Imaging Section.  Brief recommendations include the following:  Clinical Impression  Pt has a moderate, primarily oral dysphagia characterized by oral holding, lingual pumping, piecemeal swallows, and decreased bolus cohesion. Mod cues were provided throughout study for pt to initiate oral transit. She is not able to obtain liquids via straw and has mild-moderate anterior loss with cup sips. Nectar thick liquids are aspirated as they spill prematurely into her pharynx/larynx. She triggers a consistent cough response, which is protective enough to clear most of her aspirates but not all of them. Recommend Dys 1 diet and honey thick liquids by cup with meds crushed in puree. Suspect that consuming an entire meal tray may be a laborious task for her, so she will likely benefit from smaller, more frequent intake and rest breaks.   Swallow Evaluation Recommendations       SLP Diet Recommendations: Dysphagia 1 (Puree) solids;Honey thick liquids   Liquid Administration via: Cup;No straw   Medication Administration: Whole meds with puree   Supervision: Patient able to self feed;Full supervision/cueing for compensatory strategies   Compensations: Slow rate;Small sips/bites;Follow solids with liquid   Postural Changes: Remain semi-upright after after feeds/meals (Comment);Seated upright at 90 degrees   Oral Care Recommendations: Oral care BID   Other Recommendations: Order thickener from pharmacy;Prohibited food (jello, ice cream, thin soups);Remove water pitcher;Have oral suction available    Germain Osgood 10/11/2016,2:24 PM   Germain Osgood, M.A. CCC-SLP [phone]

## 2016-10-11 NOTE — Evaluation (Signed)
Physical Therapy Evaluation Patient Details Name: Susan Patel MRN: [id number] DOB: 1940/10/14 Today's Date: 10/11/2016   History of Present Illness  76 yo with PMH significant for HTN, DM, fibropmyalgia, previous CVA (L internal capsule) with new onset of L sided weaknessa nd increased difficulty speaking. +tPA. Pt with new L BG lacunar infarct of indeterminant age New R occipital infarct possibly acute and acute R BG infarct.   Clinical Impression  Pt admitted with/for new stroke s/s.  Pt generally at a min assist level for basic mobility and gait.  Pt currently limited functionally due to the problems listed. ( See problems list.)   Pt will benefit from PT to maximize function and safety in order to get ready for next venue listed below.     Follow Up Recommendations CIR    Equipment Recommendations  None recommended by PT    Recommendations for Other Services Rehab consult     Precautions / Restrictions Precautions Precautions: Fall Restrictions Weight Bearing Restrictions: No      Mobility  Bed Mobility Overal bed mobility: Needs Assistance Bed Mobility: Supine to Sit     Supine to sit: Mod assist;Min assist (min to come up to sitting and mod to help coord. scoot.)     General bed mobility comments: Pt required vc to problem solve how to roll to side before pushing up then mod A to help transition trunk from sidelying to sit  Transfers Overall transfer level: Needs assistance   Transfers: Sit to/from Stand;Stand Pivot Transfers Sit to Stand: Min assist Stand pivot transfers: Min assist       General transfer comment: pt needs stability assist with tendency to list posteriorly initially  Ambulation/Gait Ambulation/Gait assistance: Min assist Ambulation Distance (Feet): 100 Feet Assistive device: 1 person hand held assist Gait Pattern/deviations: Step-through pattern     General Gait Details: wide BOS, tends to start speeding up out of control until  reigned in.  Unsteadiness increases with scanning though no LOB  Stairs            Wheelchair Mobility    Modified Rankin (Stroke Patients Only) Modified Rankin (Stroke Patients Only) Pre-Morbid Rankin Score: Moderate disability Modified Rankin: Moderately severe disability     Balance Overall balance assessment: Needs assistance Sitting-balance support: No upper extremity supported Sitting balance-Leahy Scale: Good Sitting balance - Comments: accepted moderate challenge and generally maintained midline   Standing balance support: No upper extremity supported Standing balance-Leahy Scale: Poor Standing balance comment: for stability assist needed external support.                             Pertinent Vitals/Pain Pain Assessment: Faces Pain Score: 0-No pain Faces Pain Scale: No hurt    Home Living Family/patient expects to be discharged to:: Private residence Living Arrangements: Spouse/significant other Available Help at Discharge: Available 24 hours/day Type of Home: House Home Access: Ramped entrance;Stairs to enter Entrance Stairs-Rails: Right Entrance Stairs-Number of Steps: 4 Home Layout: One level;Able to live on main level with bedroom/bathroom (has chair lift for upstairs) Home Equipment: Shower seat - built in;Grab bars - tub/shower;Cane - single point;Bedside commode      Prior Function Level of Independence: Independent               Hand Dominance   Dominant Hand: Right    Extremity/Trunk Assessment   Upper Extremity Assessment RUE Deficits / Details: using functionally. slower movemetns; per husband, hx of  R shoulder pain. not painful on eval LUE Deficits / Details: generalized weakness but AROM WFL for ADL. slower movements but using funcitonally LUE Coordination: decreased fine motor    Lower Extremity Assessment Lower Extremity Assessment: RLE deficits/detail;LLE deficits/detail RLE Deficits / Details: grossly >=4/5,  some proximal weakness more coordination than L LE RLE Coordination: decreased fine motor LLE Deficits / Details: LLE appears weaker than R. More difficulty with heel to shin LLE Coordination: decreased fine motor    Cervical / Trunk Assessment Cervical / Trunk Assessment: Other exceptions Cervical / Trunk Exceptions: posterior bias  Communication   Communication: Expressive difficulties  Cognition Arousal/Alertness: Awake/alert Behavior During Therapy: WFL for tasks assessed/performed Overall Cognitive Status: Impaired/Different from baseline Area of Impairment: Attention;Memory;Safety/judgement;Awareness;Problem solving                   Current Attention Level: Sustained Memory: Decreased short-term memory   Safety/Judgement: Decreased awareness of safety;Decreased awareness of deficits Awareness: Emergent Problem Solving: Slow processing (b) General Comments: will further assess      General Comments      Exercises     Assessment/Plan    PT Assessment Patient needs continued PT services  PT Problem List Decreased strength;Decreased activity tolerance;Decreased balance;Decreased mobility;Decreased coordination       PT Treatment Interventions Gait training;Functional mobility training;Therapeutic activities;Balance training;DME instruction;Neuromuscular re-education;Patient/family education    PT Goals (Current goals can be found in the Care Plan section)  Acute Rehab PT Goals Patient Stated Goal: to get better PT Goal Formulation: With patient Time For Goal Achievement: 10/18/16 Potential to Achieve Goals: Good    Frequency Min 4X/week   Barriers to discharge        Co-evaluation               AM-PAC PT "6 Clicks" Daily Activity  Outcome Measure Difficulty turning over in bed (including adjusting bedclothes, sheets and blankets)?: Total Difficulty moving from lying on back to sitting on the side of the bed? : Total Difficulty sitting down  on and standing up from a chair with arms (e.g., wheelchair, bedside commode, etc,.)?: Total Help needed moving to and from a bed to chair (including a wheelchair)?: A Little Help needed walking in hospital room?: A Little Help needed climbing 3-5 steps with a railing? : A Lot 6 Click Score: 11    End of Session   Activity Tolerance: Patient tolerated treatment well Patient left: in bed;with call bell/phone within reach;with bed alarm set;with family/visitor present Nurse Communication: Mobility status PT Visit Diagnosis: Unsteadiness on feet (R26.81);Other symptoms and signs involving the nervous system (R29.898)    Time: 1401-1436 PT Time Calculation (min) (ACUTE ONLY): 35 min   Charges:   PT Evaluation $PT Eval Moderate Complexity: 1 Procedure PT Treatments $Gait Training: 8-22 mins   PT G Codes:        Oct 25, 2016  Donnella Sham, PT [phone]  (pager)  Susan Patel 10/25/2016, 3:50 PM

## 2016-10-11 NOTE — PMR Pre-admission (Signed)
PMR Admission Coordinator Pre-Admission Assessment  Patient: Susan Patel is an 76 y.o., female MRN: [id number] DOB: November 15, 1940 Height: 5\' 2"  (157.5 cm) Weight: 88.1 kg (194 lb 4.8 oz)              Insurance Information HMO:     PPO: yes     PCP:      IPA:      80/20:      OTHER: medicare advantage plan PRIMARY: [employer] Medicare      Policy#: [id number]      Subscriber: pt CM Name: Orvan July      Phone#: [phone]     Fax#: [phone] Pre-Cert#: [id number] approved for 7 days with Vevelyn Royals to f/u in 7 days    Employer: retired Benefits:  Phone #: [phone]     Name: 10/12/16 Eff. Date: 05/17/16     Deduct: none      Out of Pocket Max: $4000      Life Max: none CIR: $16o co pay per day days 1-10 then covered at 100%     SNF: no co pay days 1-20; $50 co pay per day days 21-100 Outpatient: $20 co pay per visit     Co-Pay: visits per medical necessity Home Health: 100%      Co-Pay: visits per medical necessity DME: 80%     Co-Pay: 20% Providers:  In network  SECONDARY: none        Medicaid Application Date:       Case Manager:  Disability Application Date:       Case Worker:   Emergency Facilities manager Information    Name Relation Home Work Mobile   Liddicoat,Wayne Spouse [phone]  [phone]     Current Medical History  Patient Admitting Diagnosis:  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 female COPD- oxygen dependent, T2DM, Neuropathy, chronic pain, CVA with residual visual deficits?, Parkinsonism; who was admitted on 10/09/16 with inability to speak, left sided weakness and difficulty walking. CT head done revealing suspicion of acute right basal infarct, subacute right occipital infarct, left basal infarct question age and extensive small vessel disease. She receive TPA and continues on ASA for secondary stroke prevention.  MRI brain done revealing acute infracts  right basal ganglia nd anteiro right parietal white matter with chronic right occipital cortical infarct. CTA head/neck showed severe progressive advanced intracranial atherosclerosis.  2 D echo revealed EF 60-65%, with grade one diastolic dysfunction, severe aspiration risk--NPO. She reported severe HA yesterday and repeat CT head without acute changes.  Patient with sever dysarthria, severe expressive deficits, poor safety as well as decrease in mobility and ability to carry out ADL tasks.   Total: 4 NIHSS  Past Medical History  Past Medical History:  Diagnosis Date  . Arthritis    'all over"  . Asthma   . Chronic pain   . COPD (chronic obstructive pulmonary disease) (HCC)    O2 per [place] at nights   . Depression   . Diabetes mellitus without complication ([place])    borderline- , states she was on med., but MD told her "everything is under control so I threw the bottle away"  . Fibromyalgia   . GERD (gastroesophageal reflux disease)   . Hypertension   . Neuromuscular disorder (HCC)    parkinson, neuropathy- both feet & hands.  . Stroke [facility])    residual dysarthria  Family History  family history includes Cancer in her father; Heart disease in her father and mother.  Prior Rehab/Hospitalizations:  Has the patient had major surgery during 100 days prior to admission? No   CIR 03/2016 with d/c home after discharge. Was at outpatient rehab at [facility] until January  Current Medications   Current Facility-Administered Medications:  .  0.9 %  sodium chloride infusion, , Intravenous, Continuous, [place], Early Chars, PA-C, Last Rate: 75 mL/hr at 10/11/16 0435 .  acetaminophen (TYLENOL) tablet 650 mg, 650 mg, Oral, Q4H PRN, 650 mg at 10/11/16 2225 **OR** acetaminophen (TYLENOL) solution 650 mg, 650 mg, Per Tube, Q4H PRN **OR** acetaminophen (TYLENOL) suppository 650 mg, 650 mg, Rectal, Q4H PRN, [name], MD .  clopidogrel (PLAVIX) tablet 75 mg, 75 mg, Oral, Daily,  Garvin Fila, MD, 75 mg at 10/12/16 1027 .  insulin aspart (novoLOG) injection 2-6 Units, 2-6 Units, Subcutaneous, Q4H, [name], MD, 2 Units at 10/12/16 0830 .  MEDLINE mouth rinse, 15 mL, Mouth Rinse, q12n4p, Sarina Ill B, MD, 15 mL at 10/11/16 1200 .  pantoprazole (PROTONIX) injection 40 mg, 40 mg, Intravenous, QHS, [name], MD, 40 mg at 10/11/16 2225 .  RESOURCE THICKENUP CLEAR, , Oral, PRN, Garvin Fila, MD .  rosuvastatin (CRESTOR) tablet 40 mg, 40 mg, Oral, q1800, Garvin Fila, MD, 40 mg at 10/11/16 1818  Patients Current Diet: DIET - DYS 1 Room service appropriate? Yes; Fluid consistency: Honey Thick. Meds whole in puree. No straws  Precautions / Restrictions Precautions Precautions: Fall Precaution Comments: full supervision for eating, puree diet, honey thick liquids Restrictions Weight Bearing Restrictions: No   Has the patient had 2 or more falls or a fall with injury in the past year?Yes spouse explains that pt leaves a lot of things at her feet when she is seated and then trips over these things. Maybe 3 falls since January  Prior Activity Level Limited Community (1-2x/wk): ambulatory without AD since receiving oupt therapy in January  Home Assistive Devices / [place] Devices/Equipment: Eyeglasses, Other (Comment) (Walker and cane, but does not use) Home Equipment: Civil engineer, contracting - built in, [employer] - tub/shower, [employer] - single point, Bedside commode  Prior Device Use: Indicate devices/aids used by the patient prior to current illness, exacerbation or injury? None of the above  Prior Functional Level Prior Function Level of Independence: Independent  Self Care: Did the patient need help bathing, dressing, using the toilet or eating?  Independent  Indoor Mobility: Did the patient need assistance with walking from room to room (with or without device)? Independent  Stairs: Did the patient need assistance with  internal or external stairs (with or without device)? Needed some help  Functional Cognition: Did the patient need help planning regular tasks such as shopping or remembering to take medications? Needed some help  Current Functional Level Cognition  Arousal/Alertness: Awake/alert Overall Cognitive Status: Impaired/Different from baseline Current Attention Level: Sustained Orientation Level: Oriented X4 Following Commands: Follows multi-step commands inconsistently, Follows multi-step commands with increased time Safety/Judgement: Decreased awareness of safety, Decreased awareness of deficits General Comments: administerred DGI and pt required multiple v/c's for each task on assessment Attention: Sustained, Selective Sustained Attention: Impaired Sustained Attention Impairment: Verbal basic, Functional basic Selective Attention: Impaired Selective Attention Impairment: Verbal basic, Functional basic    Extremity Assessment (includes Sensation/Coordination)  Upper Extremity Assessment: RUE deficits/detail, LUE deficits/detail RUE Deficits / Details: using functionally. slower movemetns; per husband, hx of  R shoulder pain. not painful on eval LUE Deficits / Details: generalized weakness but AROM WFL for ADL. slower movements but using funcitonally LUE Coordination: decreased fine motor  Lower Extremity Assessment: RLE deficits/detail, LLE deficits/detail RLE Deficits / Details: grossly >=4/5, some proximal weakness more coordination than L LE RLE Coordination: decreased fine motor LLE Deficits / Details: LLE appears weaker than R. More difficulty with heel to shin LLE Coordination: decreased fine motor    ADLs  Overall ADL's : Needs assistance/impaired Eating/Feeding: NPO Grooming: Minimal assistance, Sitting Upper Body Bathing: Minimal assistance, Sitting Lower Body Bathing: Moderate assistance, Sit to/from stand Upper Body Dressing : Minimal assistance, Sitting Lower Body  Dressing: Maximal assistance, Sit to/from stand Toilet Transfer: Moderate assistance, BSC, Stand-pivot Toileting- Clothing Manipulation and Hygiene: Moderate assistance, Sit to/from stand Toileting - Clothing Manipulation Details (indicate cue type and reason): using L hand for pericare Functional mobility during ADLs: Moderate assistance    Mobility  Overal bed mobility: Needs Assistance Bed Mobility: Supine to Sit Supine to sit: Mod assist, Min assist (min to come up to sitting and mod to help coord. scoot.) General bed mobility comments: pt up in chair upon PT arrival    Transfers  Overall transfer level: Needs assistance Transfers: Sit to/from Stand Sit to Stand: Min assist Stand pivot transfers: Min assist General transfer comment: v/c's to push up from chair, v/c's to slow down, posterior lean initially    Ambulation / Gait / Stairs / Wheelchair Mobility  Ambulation/Gait Ambulation/Gait assistance: Museum/gallery curator (Feet): 150 Feet Assistive device: 1 person hand held assist Gait Pattern/deviations: Step-through pattern, Decreased stride length, Shuffle, Wide base of support, Staggering left, Staggering right General Gait Details: pt unsteady requiring minA to maintain balance. pt with staggering L/R despite wide base of support and decreased step height Gait velocity: varied between slow and fast Stairs: Yes Stairs assistance: Min assist Stair Management: One rail Right Number of Stairs: 12 General stair comments: pt required minA and increased time. v/c's for step to pattern as pt unable to step up with R LE due to weakness    Posture / Balance Dynamic Sitting Balance Sitting balance - Comments: accepted moderate challenge and generally maintained midline Balance Overall balance assessment: Needs assistance Sitting-balance support: No upper extremity supported Sitting balance-Leahy Scale: Good Sitting balance - Comments: accepted moderate challenge and  generally maintained midline Standing balance support: No upper extremity supported Standing balance-Leahy Scale: Poor Standing balance comment: for stability assist needed external support. Standardized Balance Assessment Standardized Balance Assessment : Dynamic Gait Index Dynamic Gait Index Level Surface: Mild Impairment Change in Gait Speed: Mild Impairment Gait with Horizontal Head Turns: Mild Impairment Gait with Vertical Head Turns: Moderate Impairment Gait and Pivot Turn: Moderate Impairment Step Over Obstacle: Mild Impairment Step Around Obstacles: Mild Impairment Steps: Moderate Impairment Total Score: 13    Special needs/care consideration BiPAP/CPAP n/a CPM  N/a Continuous Drip IV  N/a Dialysis  N/a Life Vest n/a Oxygen oxygen at night 3 liters [place] Special Bed  N/a Trach Size  N/a Wound Vac n/a Skin intact                             Bowel mgmt:continent LBM 10/11/16 Bladder mgmt:some incontinence Diabetic mgmt Hgb A1c 6.3   Previous Home Environment Living Arrangements: Spouse/significant other  Lives With: Spouse Available Help at Discharge: Available 24 hours/day Type of Home: House Home Layout: Two level, Able to live on  main level with bedroom/bathroom Home Access: Ramped entrance, Stairs to enter Entrance Stairs-Rails: Right Entrance Stairs-Number of Steps: 4 Bathroom Shower/Tub:  (walk in tub with door) Bathroom Toilet: Standard Bathroom Accessibility: Yes How Accessible: Accessible via walker [place]: No  Discharge Living Setting Plans for Discharge Living Setting: Patient's home, Lives with (comment) (spouse) Type of Home at Discharge: House Discharge Home Layout: Two level, Able to live on main level with bedroom/bathroom Discharge Home Access: Stairs to enter, Ramped entrance Entrance Stairs-Rails: Right Entrance Stairs-Number of Steps: 4 Discharge Bathroom Shower/Tub:  (walk in tub) Discharge Bathroom Toilet: Standard Discharge  Bathroom Accessibility: Yes How Accessible: Accessible via walker Does the patient have any problems obtaining your medications?: No  Social/Family/Support Systems Patient Roles: Spouse Contact Information: [name], spouse Anticipated Caregiver: spouse Anticipated Ambulance person Information: see above Ability/Limitations of Caregiver: none Caregiver Availability: 24/7 Discharge Plan Discussed with Primary Caregiver: Yes Is Caregiver In Agreement with Plan?: Yes Does Caregiver/Family have Issues with Lodging/Transportation while Pt is in Rehab?: No  Goals/Additional Needs Patient/Family Goal for Rehab: Mod I with Pt, OT, and SLP Expected length of stay: ELOS 7 days Pt/Family Agrees to Admission and willing to participate: Yes Program Orientation Provided & Reviewed with Pt/Caregiver Including Roles  & Responsibilities: Yes  Decrease burden of Care through IP rehab admission: n/a  Possible need for SNF placement upon discharge: not anticipated  Patient Condition: This patient's condition remains as documented in the consult dated 10/11/2016, in which the Rehabilitation Physician determined and documented that the patient's condition is appropriate for intensive rehabilitative care in an inpatient rehabilitation facility. Will admit to inpatient rehab today.  Preadmission Screen Completed By:  Cleatrice Burke, 10/12/2016 1:47 PM ______________________________________________________________________   Discussed status with Dr. Naaman Plummer on 10/12/2016 at  1347 and received telephone approval for admission today.  Admission Coordinator:  Cleatrice Burke, time 1749 Date 10/12/2016

## 2016-10-11 NOTE — Progress Notes (Signed)
  Speech Language Pathology Treatment: Dysphagia  Patient Details Name: Susan Patel MRN: [id number] DOB: March 10, 1941 Today's Date: 10/11/2016 Time: 1771-1657 SLP Time Calculation (min) (ACUTE ONLY): 23 min  Assessment / Plan / Recommendation Clinical Impression  Pt has oral holding and what appears to be piecemeal swallows, although ultimately she has good oral clearance with no need for oral suction today. Thin liquids appear to be swallowed with improved automaticity as compared to purees. She does however have immediate coughing that follows thin liquids and bites of purees when thin liquids are used as a liquid wash. Recommend to proceed with MBS to better assess oropharyngeal swallow. Will plan for this afternoon.    HPI HPI: Cadance Raus Knightis a 76 y.o.femalewith history of T2DM with neuropathy, HTN, fibromyalgia--chronic pain, COPD--oxygen at nights,right visual deficits with recent CVA (left internal capsule) 03/2016 and resultant mild pharyngeal phase dysphagia (MBS showed swallow delay to pyriform sinus with thin and nectar-thick liquids). Pt with CIR, OP therapy d/c from OP 05/31/16 on regular diet with thin liquids. Admitted 10/09/16, CT shows suspected acute right basal ganglia infarct, new left basal ganglia lacunar infarct, possibly subacute right occipital infarct.      SLP Plan  MBS       Recommendations  Diet recommendations: NPO Medication Administration: Via alternative means                Oral Care Recommendations: Oral care QID Follow up Recommendations: Inpatient Rehab SLP Visit Diagnosis: Dysphagia, unspecified (R13.10) Plan: MBS       GO                Germain Osgood 10/11/2016, 11:37 AM  Germain Osgood, M.A. CCC-SLP [phone]

## 2016-10-11 NOTE — Discharge Summary (Signed)
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  Past Medical History:  Diagnosis Date  . Arthritis    'all over"  . Asthma   . Chronic pain   . COPD (chronic obstructive pulmonary disease) (HCC)    O2 per [place] at nights   . Depression   . Diabetes mellitus without complication ([place])    borderline- , states she was on med., but MD told her "everything is under control so I threw the bottle away"  . Fibromyalgia   . GERD (gastroesophageal reflux disease)   . Hypertension   . Neuromuscular disorder (HCC)    parkinson, neuropathy- both feet & hands.  . Stroke [facility])    residual dysarthria   Past Surgical History:  Procedure Laterality Date  . ABDOMINAL HYSTERECTOMY    . ANTERIOR CERVICAL DECOMP/DISCECTOMY FUSION N/A 07/10/2015   Procedure: Cervical five-six, Cervical six-seven anterior cervical decompression with fusion plating and bonegraft;  Surgeon: Jovita Gamma, MD;  Location: [facility];  Service: Neurosurgery;  Laterality: N/A;  . APPENDECTOMY    . BIOPSY EYE MUSCLE  03/20/2016   biopsy vessel to right eye due to swelling  . EYE SURGERY Bilateral    cataracts removed, /w "cyrstal lenses"   . SHOULDER ARTHROSCOPY Right    x2   RCR- spurs removed     Medications to be continued on Rehab . clopidogrel  75 mg Oral Daily  . insulin aspart  2-6 Units Subcutaneous Q4H  . mouth rinse  15 mL Mouth Rinse q12n4p  . pantoprazole (PROTONIX) IV  40 mg Intravenous  QHS  . rosuvastatin  40 mg Oral q1800    LABORATORY STUDIES CBC    Component Value Date/Time   WBC 6.5 10/11/2016 0437   RBC 4.44 10/11/2016 0437   HGB 13.2 10/11/2016 0437   HCT 39.5 10/11/2016 0437   HCT 37.7 11/06/2015 1257   PLT 113 (L) 10/11/2016 0437   PLT 480 (H) 11/06/2015 1257   MCV 89.0 10/11/2016 0437   MCV 85 11/06/2015 1257   MCH 29.7 10/11/2016 0437   MCHC 33.4 10/11/2016 0437   RDW 13.8 10/11/2016 0437   RDW 14.7 11/06/2015 1257   LYMPHSABS 2.4 10/11/2016 0437   LYMPHSABS 2.2 11/06/2015 1257   MONOABS 0.6 10/11/2016 0437   EOSABS 0.2 10/11/2016 0437   EOSABS 0.1 11/06/2015 1257   BASOSABS 0.1 10/11/2016 0437   BASOSABS 0.1 11/06/2015 1257   CMP    Component Value Date/Time   NA 140 10/11/2016 0437   NA 140 09/15/2016 1723   K 4.7 10/11/2016 0437   CL 106 10/11/2016 0437   CO2 22 10/11/2016 0437   GLUCOSE 89 10/11/2016 0437   BUN 12 10/11/2016 0437   BUN 26 09/15/2016 1723   CREATININE 0.75 10/11/2016 0437   CALCIUM 9.0 10/11/2016 0437   PROT 6.9 10/09/2016 1235   PROT 6.9 11/06/2015 1257   ALBUMIN 3.6 10/09/2016 1235   ALBUMIN  4.1 11/06/2015 1257   AST 21 10/09/2016 1235   ALT 18 10/09/2016 1235   ALKPHOS 167 (H) 10/09/2016 1235   BILITOT 0.6 10/09/2016 1235   BILITOT 0.4 11/06/2015 1257   GFRNONAA >60 10/11/2016 0437   GFRAA >60 10/11/2016 0437   COAGS Lab Results  Component Value Date   INR 1.03 10/09/2016   INR 0.96 03/20/2016   Lipid Panel    Component Value Date/Time   CHOL 154 10/10/2016 0141   CHOL 175 09/15/2016 1723   TRIG 104 10/10/2016 0141   HDL 43 10/10/2016 0141   HDL 44 09/15/2016 1723   CHOLHDL 3.6 10/10/2016 0141   VLDL 21 10/10/2016 0141   LDLCALC 90 10/10/2016 0141   LDLCALC 97 09/15/2016 1723   HgbA1C  Lab Results  Component Value Date   HGBA1C 6.6 (H) 03/21/2016   Urinalysis    Component Value Date/Time   COLORURINE YELLOW 10/09/2016 1350   APPEARANCEUR HAZY (A) 10/09/2016 1350   LABSPEC 1.014  10/09/2016 1350   PHURINE 5.0 10/09/2016 1350   GLUCOSEU NEGATIVE 10/09/2016 1350   HGBUR NEGATIVE 10/09/2016 1350   BILIRUBINUR NEGATIVE 10/09/2016 1350   KETONESUR NEGATIVE 10/09/2016 1350   PROTEINUR NEGATIVE 10/09/2016 1350   NITRITE NEGATIVE 10/09/2016 1350   LEUKOCYTESUR SMALL (A) 10/09/2016 1350   Urine Drug Screen     Component Value Date/Time   LABOPIA NONE DETECTED 10/09/2016 1350   COCAINSCRNUR NONE DETECTED 10/09/2016 1350   LABBENZ NONE DETECTED 10/09/2016 1350   AMPHETMU NONE DETECTED 10/09/2016 1350   THCU NONE DETECTED 10/09/2016 1350   LABBARB NONE DETECTED 10/09/2016 1350    Alcohol Level    Component Value Date/Time   ETH <5 10/09/2016 1235     SIGNIFICANT DIAGNOSTIC STUDIES Ct Angio Head W/cm &/or Wo Cm Ct Angio Neck W And/or Wo Contrast 10/09/2016 1. No emergent large vessel occlusion.  2. Progressive, advanced intracranial atherosclerosis including severe stenoses involving both supraclinoid ICAs, MCA bifurcation regions, distal left vertebral artery, proximal PCAs, and numerous anterior and posterior circulation branch vessels. Right PCA branch vessel occlusion corresponding to the previously described infarct.  3. No cervical carotid artery stenosis.     Ct Head Wo Contrast 10/10/2016 No intracranial hemorrhage or acute intracranial process. Stable age indeterminate basal ganglia and thalamus lacunar infarcts. Subacute to old RIGHT occipital lobe/ PCA territory infarct. Severe chronic small vessel ischemic disease.     Ct Head Code Stroke W/o Cm 10/09/2016 1. Suspected acute right basal ganglia infarct.  2. New left basal ganglia lacunar infarct, of indeterminate age.  3. New right occipital lobe infarct, possibly subacute.  4. Extensive chronic small vessel ischemic disease.  5. No acute intracranial hemorrhage.    2DEcho : Left ventricle: The cavity size was normal. There was mild   concentric hypertrophy. Systolic function was normal.  The estimated ejection fraction was in the range of 60% to 65%. Wall motion was normal; there were no regional wall motion   abnormalities HISTORY OF PRESENT ILLNESS Esti Demello Knightis a 76 y.o.femalewith a historyof previous stroke who was in her normal state of health at bedtime last night 10/08/2016. Her daughter reported that she spoke with her at 10 am and that she was in her normal state of health at that time. She presented to [facility] where she was evaluated by teleneurology [facility]). She received IV tPA 10/09/2016 at 1415. Subsequently she endorsed through head shaking that she was having more difficulty walking even on awakening, but this was after  tpa.   She was speaking still and apparently normal enough at10am to be reported as normal. She went to breakfast after this and needed a little more help to get in her chair. She then was having some trouble swallowing and speaking and therefor 911 was called. Dr. Tobias Alexander saw on arrival to [facility]. Her LKW was initially reported as 10am, but he suspects her LKW was 10/08/16 prior to bed. He quesitoned true aphasia vs bulbar dysfunction. Patient was admitted to [facility] for further evaluation and treatment.    HOSPITAL COURSE  Patient was admitted to the intensive care unit and blood pressure was tightly controlled. She remained neurologically stable. She was subsequently transferred to the floor and seen by the therapist. She was felt neurologically stable to be transferred to inpatient rehabilitation for continuing ongoing therapies. The results of the studies are pending as follows.   CT head - multiple subacute to old infarcts. MRI head - 1. Acute infarcts in the right basal ganglia and anterior right parietal white matter..2. Chronic right occipital cortical infarct and extensive chronic  small vessel ischemic changes  MRA head - not performed  CTA H&N - Severe, progressive, advanced intracranial atherosclerosis as above.  Carotid Doppler - CTA  neck 2D Echo -Left ventricle: The cavity size was normal. There was mild concentric hypertrophy. Systolic function was normal. The estimated ejection fraction was in the range of 60% to 65%. Wall motion was normal; there were no regional wall motion  abnormalities  LDL - 90  HgbA1c - 6.3  VTE prophylaxis  DIET - DYS 1 Room service appropriate? Yes; Fluid consistency: Honey Thick  aspirin 325 mg daily prior to admission, now on No antithrombotic secondary to TPA therapy.  Patient counseled to be compliant with her antithrombotic medications  Ongoing aggressive stroke risk factor management  DISCHARGE EXAM Blood pressure (!) 168/77, pulse 74, temperature 98.5 F (36.9 C), temperature source Oral, resp. rate 17, height 5\' 2"  (1.575 m), weight 88.1 kg (194 lb 4.8 oz), SpO2 100 %. Physical exam: Exam: Gen: Obese elderly Caucasian lady not in distress          CV: RRR, no MRG. No Carotid Bruits. No peripheral edema, warm, nontender Eyes: Conjunctivae clear without exudates or hemorrhage  Neuro: Detailed Neurologic Exam  Speech:    Speech is clear without evidence of dysarthria or aphasia  Cognition:    The patient is oriented to person, month, year. Can follow  Commands well.    Cranial Nerves:    The pupils are equal, round, and reactive to light. Visual fields are full to finger confrontation. Extraocular movements are intact. Trigeminal sensation is intact and the muscles of mastication are normal. The face is symmetric. The palate elevates in the midline. Hearing intact. Voice is normal. Shoulder shrug is normal. The tongue has normal motion without fasciculations.   Coordination:    No dysmetria noted   Gait:    Heel-toe and tandem gait are normal.   Motor Observation:    no involuntary movements noted. Tone:    Normal muscle tone.     Strength:    Mild Left arm and leg weakness 4+/5. Right side intact.      Sensation: intact to LT        Discharge Diet  Diet NPO time specified liquids  DISCHARGE PLAN  Disposition:  Transfer to [place] for ongoing PT, OT and ST  aspirin 325 mg daily for secondary stroke prevention.  Recommend ongoing risk  factor control by Primary Care Physician at time of discharge from inpatient rehabilitation.  Follow-up Eustaquio Maize, MD in 2 weeks following discharge from rehab.  Follow-up with Dr. Rosalin Hawking  [facility] in 6 weeks, office to schedule an appointment.   45 minutes were spent preparing discharge.  I have personally examined this patient, reviewed notes, independently viewed imaging studies, participated in medical decision making and plan of care.ROS completed by me personally and pertinent positives fully documented  I have made any additions or clarifications directly to the above note.   Antony Contras, MD Medical Director [facility] Pager: [phone] 10/14/2016 4:49 PM

## 2016-10-11 NOTE — Progress Notes (Signed)
I met with pt and her spouse at bedside. We discussed an inpt rehab admit and they are in agreement. [employer] medicare is closed today. I will begin insurance approval in the morning for possible admit if approved by insurance. I will follow up tomorrow. [phone]

## 2016-10-12 ENCOUNTER — Encounter (HOSPITAL_COMMUNITY): Payer: Self-pay

## 2016-10-12 ENCOUNTER — Inpatient Hospital Stay (HOSPITAL_COMMUNITY)
Admission: RE | Admit: 2016-10-12 | Discharge: 2016-10-20 | DRG: 092 | Disposition: A | Discharge status: Home / Self Care | Payer: Medicare Other | Source: Intra-hospital | Attending: Physical Medicine & Rehabilitation | Admitting: Physical Medicine & Rehabilitation

## 2016-10-12 DIAGNOSIS — E1151 Type 2 diabetes mellitus with diabetic peripheral angiopathy without gangrene: Secondary | ICD-10-CM | POA: Diagnosis present

## 2016-10-12 DIAGNOSIS — G20 Parkinson's disease: Secondary | ICD-10-CM | POA: Diagnosis present

## 2016-10-12 DIAGNOSIS — R2689 Other abnormalities of gait and mobility: Principal | ICD-10-CM | POA: Diagnosis present

## 2016-10-12 DIAGNOSIS — E46 Unspecified protein-calorie malnutrition: Secondary | ICD-10-CM

## 2016-10-12 DIAGNOSIS — F329 Major depressive disorder, single episode, unspecified: Secondary | ICD-10-CM | POA: Diagnosis present

## 2016-10-12 DIAGNOSIS — G4700 Insomnia, unspecified: Secondary | ICD-10-CM | POA: Diagnosis present

## 2016-10-12 DIAGNOSIS — K5900 Constipation, unspecified: Secondary | ICD-10-CM | POA: Diagnosis present

## 2016-10-12 DIAGNOSIS — I639 Cerebral infarction, unspecified: Secondary | ICD-10-CM | POA: Diagnosis not present

## 2016-10-12 DIAGNOSIS — Z9981 Dependence on supplemental oxygen: Secondary | ICD-10-CM | POA: Diagnosis not present

## 2016-10-12 DIAGNOSIS — I69322 Dysarthria following cerebral infarction: Secondary | ICD-10-CM

## 2016-10-12 DIAGNOSIS — E119 Type 2 diabetes mellitus without complications: Secondary | ICD-10-CM | POA: Diagnosis not present

## 2016-10-12 DIAGNOSIS — K219 Gastro-esophageal reflux disease without esophagitis: Secondary | ICD-10-CM | POA: Diagnosis present

## 2016-10-12 DIAGNOSIS — I69354 Hemiplegia and hemiparesis following cerebral infarction affecting left non-dominant side: Secondary | ICD-10-CM | POA: Diagnosis not present

## 2016-10-12 DIAGNOSIS — M797 Fibromyalgia: Secondary | ICD-10-CM | POA: Diagnosis present

## 2016-10-12 DIAGNOSIS — E441 Mild protein-calorie malnutrition: Secondary | ICD-10-CM | POA: Diagnosis present

## 2016-10-12 DIAGNOSIS — Z91048 Other nonmedicinal substance allergy status: Secondary | ICD-10-CM

## 2016-10-12 DIAGNOSIS — I69398 Other sequelae of cerebral infarction: Secondary | ICD-10-CM | POA: Diagnosis not present

## 2016-10-12 DIAGNOSIS — E8809 Other disorders of plasma-protein metabolism, not elsewhere classified: Secondary | ICD-10-CM

## 2016-10-12 DIAGNOSIS — R1312 Dysphagia, oropharyngeal phase: Secondary | ICD-10-CM | POA: Diagnosis present

## 2016-10-12 DIAGNOSIS — E785 Hyperlipidemia, unspecified: Secondary | ICD-10-CM | POA: Diagnosis present

## 2016-10-12 DIAGNOSIS — Z79899 Other long term (current) drug therapy: Secondary | ICD-10-CM

## 2016-10-12 DIAGNOSIS — I10 Essential (primary) hypertension: Secondary | ICD-10-CM | POA: Diagnosis present

## 2016-10-12 DIAGNOSIS — R278 Other lack of coordination: Secondary | ICD-10-CM

## 2016-10-12 DIAGNOSIS — I69392 Facial weakness following cerebral infarction: Secondary | ICD-10-CM | POA: Diagnosis not present

## 2016-10-12 DIAGNOSIS — L299 Pruritus, unspecified: Secondary | ICD-10-CM | POA: Diagnosis not present

## 2016-10-12 DIAGNOSIS — Z881 Allergy status to other antibiotic agents status: Secondary | ICD-10-CM

## 2016-10-12 DIAGNOSIS — I69391 Dysphagia following cerebral infarction: Secondary | ICD-10-CM | POA: Diagnosis not present

## 2016-10-12 DIAGNOSIS — D62 Acute posthemorrhagic anemia: Secondary | ICD-10-CM | POA: Diagnosis not present

## 2016-10-12 DIAGNOSIS — E11649 Type 2 diabetes mellitus with hypoglycemia without coma: Secondary | ICD-10-CM | POA: Diagnosis not present

## 2016-10-12 DIAGNOSIS — Z8249 Family history of ischemic heart disease and other diseases of the circulatory system: Secondary | ICD-10-CM

## 2016-10-12 DIAGNOSIS — G8929 Other chronic pain: Secondary | ICD-10-CM | POA: Diagnosis present

## 2016-10-12 DIAGNOSIS — Z90710 Acquired absence of both cervix and uterus: Secondary | ICD-10-CM

## 2016-10-12 DIAGNOSIS — Z888 Allergy status to other drugs, medicaments and biological substances status: Secondary | ICD-10-CM

## 2016-10-12 DIAGNOSIS — Z6834 Body mass index (BMI) 34.0-34.9, adult: Secondary | ICD-10-CM

## 2016-10-12 DIAGNOSIS — E1140 Type 2 diabetes mellitus with diabetic neuropathy, unspecified: Secondary | ICD-10-CM | POA: Diagnosis present

## 2016-10-12 DIAGNOSIS — Z7982 Long term (current) use of aspirin: Secondary | ICD-10-CM

## 2016-10-12 DIAGNOSIS — J449 Chronic obstructive pulmonary disease, unspecified: Secondary | ICD-10-CM | POA: Diagnosis present

## 2016-10-12 DIAGNOSIS — E876 Hypokalemia: Secondary | ICD-10-CM | POA: Diagnosis present

## 2016-10-12 DIAGNOSIS — Z981 Arthrodesis status: Secondary | ICD-10-CM

## 2016-10-12 DIAGNOSIS — R269 Unspecified abnormalities of gait and mobility: Secondary | ICD-10-CM | POA: Diagnosis not present

## 2016-10-12 LAB — GLUCOSE, CAPILLARY
GLUCOSE-CAPILLARY: 110 mg/dL — AB (ref 65–99)
GLUCOSE-CAPILLARY: 121 mg/dL — AB (ref 65–99)
Glucose-Capillary: 92 mg/dL (ref 65–99)
Glucose-Capillary: 120 mg/dL — ABNORMAL HIGH (ref 65–99)
Glucose-Capillary: 91 mg/dL (ref 65–99)

## 2016-10-12 LAB — HEMOGLOBIN A1C
HEMOGLOBIN A1C: 6.3 % — AB (ref 4.8–5.6)
Mean Plasma Glucose: 134 mg/dL

## 2016-10-12 MED ORDER — VITAMIN D (ERGOCALCIFEROL) 1.25 MG (50000 UNIT) PO CAPS
50000.0000 [IU] | ORAL_CAPSULE | ORAL | Status: DC
  Administered 2016-10-18: 50000 [IU] via ORAL
  Filled 2016-10-12: qty 1

## 2016-10-12 MED ORDER — ALBUTEROL SULFATE HFA 108 (90 BASE) MCG/ACT IN AERS: 1.0000 | INHALATION_SPRAY | Freq: Four times a day (QID) | RESPIRATORY_TRACT | Status: DC | PRN

## 2016-10-12 MED ORDER — DIPHENHYDRAMINE HCL 12.5 MG/5ML PO ELIX: 12.5000 mg | ORAL_SOLUTION | Freq: Four times a day (QID) | ORAL | Status: DC | PRN

## 2016-10-12 MED ORDER — TRAZODONE HCL 50 MG PO TABS
25.0000 mg | ORAL_TABLET | Freq: Every evening | ORAL | Status: DC | PRN
  Administered 2016-10-13 – 2016-10-19 (×6): 50 mg via ORAL
  Filled 2016-10-12 (×6): qty 1

## 2016-10-12 MED ORDER — PROCHLORPERAZINE 25 MG RE SUPP: 12.5000 mg | Freq: Four times a day (QID) | RECTAL | Status: DC | PRN

## 2016-10-12 MED ORDER — MONTELUKAST SODIUM 10 MG PO TABS
10.0000 mg | ORAL_TABLET | Freq: Every day | ORAL | Status: DC
  Administered 2016-10-12 – 2016-10-19 (×8): 10 mg via ORAL
  Filled 2016-10-12 (×8): qty 1

## 2016-10-12 MED ORDER — PROCHLORPERAZINE EDISYLATE 5 MG/ML IJ SOLN: 5.0000 mg | Freq: Four times a day (QID) | INTRAMUSCULAR | Status: DC | PRN

## 2016-10-12 MED ORDER — RESOURCE THICKENUP CLEAR PO POWD
ORAL | Status: DC | PRN
  Filled 2016-10-12: qty 125

## 2016-10-12 MED ORDER — LINACLOTIDE 72 MCG PO CAPS
72.0000 ug | ORAL_CAPSULE | Freq: Every day | ORAL | Status: DC
  Administered 2016-10-13 – 2016-10-19 (×7): 72 ug via ORAL
  Filled 2016-10-12 (×8): qty 1

## 2016-10-12 MED ORDER — POLYETHYLENE GLYCOL 3350 17 G PO PACK: 17.0000 g | PACK | Freq: Every day | ORAL | Status: DC | PRN

## 2016-10-12 MED ORDER — ACETAMINOPHEN 325 MG PO TABS
325.0000 mg | ORAL_TABLET | ORAL | Status: DC | PRN
  Administered 2016-10-13 – 2016-10-17 (×3): 650 mg via ORAL
  Filled 2016-10-12 (×3): qty 2

## 2016-10-12 MED ORDER — PANTOPRAZOLE SODIUM 40 MG IV SOLR
40.0000 mg | Freq: Every day | INTRAVENOUS | Status: DC
  Administered 2016-10-12: 40 mg via INTRAVENOUS
  Filled 2016-10-12: qty 40

## 2016-10-12 MED ORDER — ALUM & MAG HYDROXIDE-SIMETH 200-200-20 MG/5ML PO SUSP: 30.0000 mL | ORAL | Status: DC | PRN

## 2016-10-12 MED ORDER — ORAL CARE MOUTH RINSE
15.0000 mL | Freq: Two times a day (BID) | OROMUCOSAL | Status: DC
  Administered 2016-10-13 – 2016-10-18 (×7): 15 mL via OROMUCOSAL

## 2016-10-12 MED ORDER — SODIUM CHLORIDE 0.9 % IV SOLN
INTRAVENOUS | Status: DC
  Filled 2016-10-12: qty 1000

## 2016-10-12 MED ORDER — GABAPENTIN 300 MG PO CAPS
300.0000 mg | ORAL_CAPSULE | Freq: Every day | ORAL | Status: DC
  Administered 2016-10-12 – 2016-10-19 (×8): 300 mg via ORAL
  Filled 2016-10-12 (×8): qty 1

## 2016-10-12 MED ORDER — GUAIFENESIN-DM 100-10 MG/5ML PO SYRP
5.0000 mL | ORAL_SOLUTION | Freq: Four times a day (QID) | ORAL | Status: DC | PRN
  Administered 2016-10-17: 5 mL via ORAL
  Filled 2016-10-12: qty 10

## 2016-10-12 MED ORDER — FLEET ENEMA 7-19 GM/118ML RE ENEM: 1.0000 | ENEMA | Freq: Once | RECTAL | Status: DC | PRN

## 2016-10-12 MED ORDER — SODIUM CHLORIDE 0.9 % IV SOLN: INTRAVENOUS | Status: DC

## 2016-10-12 MED ORDER — FLUTICASONE FUROATE-VILANTEROL 200-25 MCG/INH IN AEPB
1.0000 | INHALATION_SPRAY | Freq: Every day | RESPIRATORY_TRACT | Status: DC
  Administered 2016-10-13 – 2016-10-20 (×7): 1 via RESPIRATORY_TRACT
  Filled 2016-10-12: qty 28

## 2016-10-12 MED ORDER — SODIUM CHLORIDE 0.9 % IV SOLN
INTRAVENOUS | Status: AC
  Administered 2016-10-12: 22:00:00 via INTRAVENOUS

## 2016-10-12 MED ORDER — GLIMEPIRIDE 2 MG PO TABS
2.0000 mg | ORAL_TABLET | Freq: Every day | ORAL | Status: DC
  Administered 2016-10-13 – 2016-10-14 (×2): 2 mg via ORAL
  Filled 2016-10-12 (×2): qty 1

## 2016-10-12 MED ORDER — FLUTICASONE PROPIONATE 50 MCG/ACT NA SUSP
2.0000 | Freq: Every day | NASAL | Status: DC | PRN
  Filled 2016-10-12: qty 16

## 2016-10-12 MED ORDER — ROSUVASTATIN CALCIUM 20 MG PO TABS
40.0000 mg | ORAL_TABLET | Freq: Every day | ORAL | Status: DC
  Administered 2016-10-13 – 2016-10-19 (×7): 40 mg via ORAL
  Filled 2016-10-12 (×7): qty 2

## 2016-10-12 MED ORDER — CLOPIDOGREL BISULFATE 75 MG PO TABS
75.0000 mg | ORAL_TABLET | Freq: Every day | ORAL | Status: DC
  Administered 2016-10-13 – 2016-10-20 (×8): 75 mg via ORAL
  Filled 2016-10-12 (×8): qty 1

## 2016-10-12 MED ORDER — PROCHLORPERAZINE MALEATE 5 MG PO TABS: 5.0000 mg | ORAL_TABLET | Freq: Four times a day (QID) | ORAL | Status: DC | PRN

## 2016-10-12 MED ORDER — ALBUTEROL SULFATE (2.5 MG/3ML) 0.083% IN NEBU: 2.5000 mg | INHALATION_SOLUTION | Freq: Four times a day (QID) | RESPIRATORY_TRACT | Status: DC | PRN

## 2016-10-12 MED ORDER — ENOXAPARIN SODIUM 40 MG/0.4ML ~~LOC~~ SOLN
40.0000 mg | SUBCUTANEOUS | Status: DC
  Administered 2016-10-13 – 2016-10-20 (×8): 40 mg via SUBCUTANEOUS
  Filled 2016-10-12 (×8): qty 0.4

## 2016-10-12 MED ORDER — TRAMADOL HCL 50 MG PO TABS
50.0000 mg | ORAL_TABLET | Freq: Four times a day (QID) | ORAL | Status: DC | PRN
  Administered 2016-10-12 – 2016-10-20 (×12): 50 mg via ORAL
  Filled 2016-10-12 (×12): qty 1

## 2016-10-12 MED ORDER — BISACODYL 10 MG RE SUPP: 10.0000 mg | Freq: Every day | RECTAL | Status: DC | PRN

## 2016-10-12 MED ORDER — LORATADINE 10 MG PO TABS
10.0000 mg | ORAL_TABLET | Freq: Every day | ORAL | Status: DC
  Administered 2016-10-13 – 2016-10-20 (×8): 10 mg via ORAL
  Filled 2016-10-12 (×8): qty 1

## 2016-10-12 MED ORDER — LATANOPROST 0.005 % OP SOLN
1.0000 [drp] | Freq: Every day | OPHTHALMIC | Status: DC
  Administered 2016-10-12 – 2016-10-19 (×8): 1 [drp] via OPHTHALMIC
  Filled 2016-10-12: qty 2.5

## 2016-10-12 NOTE — H&P (Signed)
Physical Medicine and Rehabilitation Admission H&P    Chief Complaint  Patient presents with  . Stroke with Left sided weakness, dysphagia, left facial weakness with dysarthria and expressive deficits.     HPI: Susan Patel is a 76 y.o. female COPD- oxygen dependent, T2DM, Neuropathy, chronic pain, CVA with residual visual deficits?, Parkinsonism; who was admitted on 10/09/16 with inability to speak, left sided weakness and difficulty walking. CT head done revealing suspicion of acute right basal ganglia infarct, subacute right occipital infarct, left basal infarct question age and extensive small vessel disease. She receive tPA and continues on ASA for secondary stroke prevention.  MRI brain done revealing acute infarcts right basal ganglia anterior right parietal white matter with chronic right occipital cortical infarct. CTA head/neck showed severe progressive advanced intracranial atherosclerosis.  2 D echo revealed EF 60-65%, with grade one diastolic dysfunction. MBS done yesterday and she was started on dysphagia 1, honey liquids. ASA changed to plavix due to progressive intracranial atherosclerosis  and stroke felt to be thrombotic due to small vessel disease.  She reported severe 5/27 and repeat CT head without acute changes.  Patient with severe dysarthria, severe expressive deficits, poor safety as well as decrease in mobility and ability to carry out ADL tasks. CIR recommended by rehab team   Review of Systems  Constitutional: Negative for chills and fever.  HENT: Negative for hearing loss and tinnitus.   Eyes:       Decreased vision right eye from prior stroke  Respiratory: Negative for cough and shortness of breath.   Cardiovascular: Negative for chest pain, palpitations and leg swelling.  Gastrointestinal: Negative for heartburn and nausea.  Genitourinary: Negative for dysuria and urgency.  Musculoskeletal: Positive for back pain and myalgias.  Skin: Negative for itching and  rash.  Neurological: Positive for sensory change (increase pain), speech change and focal weakness.  Psychiatric/Behavioral: The patient is nervous/anxious.       Past Medical History:  Diagnosis Date  . Arthritis    'all over"  . Asthma   . Chronic pain   . COPD (chronic obstructive pulmonary disease) (HCC)    O2 per [place] at nights   . Depression   . Diabetes mellitus without complication ([place])    borderline- , states she was on med., but MD told her "everything is under control so I threw the bottle away"  . Fibromyalgia   . GERD (gastroesophageal reflux disease)   . Hypertension   . Neuromuscular disorder (HCC)    parkinson, neuropathy- both feet & hands.  . Stroke [facility])    residual dysarthria    Past Surgical History:  Procedure Laterality Date  . ABDOMINAL HYSTERECTOMY    . ANTERIOR CERVICAL DECOMP/DISCECTOMY FUSION N/A 07/10/2015   Procedure: Cervical five-six, Cervical six-seven anterior cervical decompression with fusion plating and bonegraft;  Surgeon: Jovita Gamma, MD;  Location: [place];  Service: Neurosurgery;  Laterality: N/A;  . APPENDECTOMY    . BIOPSY EYE MUSCLE  03/20/2016   biopsy vessel to right eye due to swelling  . EYE SURGERY Bilateral    cataracts removed, /w "cyrstal lenses"   . SHOULDER ARTHROSCOPY Right    x2   RCR- spurs removed     Family History  Problem Relation Age of Onset  . Heart disease Mother   . Cancer Father   . Heart disease Father     Social History:  Married.  reports that she has never smoked. She has never used  smokeless tobacco. She reports that she does not drink alcohol or use drugs.     Allergies  Allergen Reactions  . Azithromycin Other (See Comments)    Renal failure  . Prednisone Other (See Comments)    Irritability and insomnia  . Sulfamethoxazole-Trimethoprim Other (See Comments)    Renal failure  . Amlodipine     Caused swelling in legs  . Ciprofloxacin Itching and Rash  . Tape Rash     Medications Prior to Admission  Medication Sig Dispense Refill  . aspirin 325 MG EC tablet Take 1 tablet (325 mg total) by mouth daily. 90 tablet 1  . cetirizine (ZYRTEC) 10 MG tablet Take 10 mg by mouth daily.    . DULoxetine (CYMBALTA) 30 MG capsule Take 2 capsules (60 mg total) by mouth daily. (Patient taking differently: Take 60 mg by mouth 2 (two) times daily. ) 60 capsule 3  . esomeprazole (NEXIUM) 20 MG capsule Take 20 mg by mouth daily.    . fluticasone (FLONASE) 50 MCG/ACT nasal spray Place 2 sprays into both nostrils daily. (Patient taking differently: Place 2 sprays into both nostrils daily as needed for allergies. ) 54 g 0  . fluticasone furoate-vilanterol (BREO ELLIPTA) 200-25 MCG/INH AEPB Inhale 1 puff into the lungs daily.    Marland Kitchen gabapentin (NEURONTIN) 300 MG capsule Take 1 capsule (300 mg total) by mouth at bedtime. 30 capsule 3  . glimepiride (AMARYL) 2 MG tablet TAKE 1 TABLET (2 MG TOTAL) BY MOUTH DAILY BEFORE BREAKFAST. 90 tablet 0  . latanoprost (XALATAN) 0.005 % ophthalmic solution Place 1 drop into both eyes at bedtime. 2.5 mL 12  . linaclotide (LINZESS) 72 MCG capsule Take 1 capsule (72 mcg total) by mouth daily before breakfast. 90 capsule 0  . losartan-hydrochlorothiazide (HYZAAR) 100-25 MG tablet Take 1 tablet by mouth daily. 90 tablet 3  . montelukast (SINGULAIR) 10 MG tablet TAKE 1 TABLET (10 MG TOTAL) BY MOUTH AT BEDTIME. 30 tablet 9  . OXYGEN Inhale 3-4 L into the lungs at bedtime.    . rosuvastatin (CRESTOR) 40 MG tablet Take 1 tablet (40 mg total) by mouth every evening. 90 tablet 0  . traMADol (ULTRAM) 50 MG tablet Take 1 tablet (50 mg total) by mouth every 8 (eight) hours as needed for moderate pain or severe pain. (Patient taking differently: Take 50 mg by mouth 2 (two) times daily. ) 90 tablet 2  . traZODone (DESYREL) 50 MG tablet TAKE 1 TABLET (50 MG TOTAL) BY MOUTH NIGHTLY AS NEEDED FOR FOR SLEEP. 30 tablet 0  . Vitamin D, Ergocalciferol, (DRISDOL) 50000  units CAPS capsule Take 50,000 Units by mouth every Monday.   3  . acetaminophen (TYLENOL) 500 MG tablet Take 1,000 mg by mouth every 8 (eight) hours as needed for mild pain or moderate pain.    Marland Kitchen albuterol (PROVENTIL HFA;VENTOLIN HFA) 108 (90 Base) MCG/ACT inhaler Inhale 1-2 puffs into the lungs every 6 (six) hours as needed for wheezing or shortness of breath. 1 Inhaler 1  . triamcinolone (KENALOG) 0.025 % ointment Apply 1 application topically 2 (two) times daily. (Patient taking differently: Apply 1 application topically 2 (two) times daily as needed. ) 30 g 1    Home: Home Living Family/patient expects to be discharged to:: Private residence Living Arrangements: Spouse/significant other Available Help at Discharge: Available 24 hours/day Type of Home: House Home Access: Ramped entrance, Stairs to enter [employer] of Steps: 4 Entrance Stairs-Rails: Right Home Layout: Two level, Able to live  on main level with bedroom/bathroom Bathroom Shower/Tub: Other (comment) (safe step walk in tub) Bathroom Toilet: Standard Bathroom Accessibility: Yes Home Equipment: Shower seat - built in, [employer] - tub/shower, Radio producer - single point, Bedside commode  Lives With: Spouse   Functional History: Prior Function Level of Independence: Independent  Functional Status:  Mobility: Bed Mobility Overal bed mobility: Needs Assistance Bed Mobility: Supine to Sit Supine to sit: Mod assist, Min assist (min to come up to sitting and mod to help coord. scoot.) General bed mobility comments: pt up in chair upon PT arrival Transfers Overall transfer level: Needs assistance Transfers: Sit to/from Stand Sit to Stand: Min assist Stand pivot transfers: Min assist General transfer comment: v/c's to push up from chair, v/c's to slow down, posterior lean initially Ambulation/Gait Ambulation/Gait assistance: Min assist Ambulation Distance (Feet): 150 Feet Assistive device: 1 person hand held  assist Gait Pattern/deviations: Step-through pattern, Decreased stride length, Shuffle, Wide base of support, Staggering left, Staggering right General Gait Details: pt unsteady requiring minA to maintain balance. pt with staggering L/R despite wide base of support and decreased step height Gait velocity: varied between slow and fast Stairs: Yes Stairs assistance: Min assist Stair Management: One rail Right Number of Stairs: 12 General stair comments: pt required minA and increased time. v/c's for step to pattern as pt unable to step up with R LE due to weakness    ADL: ADL Overall ADL's : Needs assistance/impaired Eating/Feeding: NPO Grooming: Minimal assistance, Sitting Upper Body Bathing: Minimal assistance, Sitting Lower Body Bathing: Moderate assistance, Sit to/from stand Upper Body Dressing : Minimal assistance, Sitting Lower Body Dressing: Maximal assistance, Sit to/from stand Toilet Transfer: Moderate assistance, BSC, Stand-pivot Toileting- Clothing Manipulation and Hygiene: Moderate assistance, Sit to/from stand Toileting - Clothing Manipulation Details (indicate cue type and reason): using L hand for pericare Functional mobility during ADLs: Moderate assistance  Cognition: Cognition Overall Cognitive Status: Impaired/Different from baseline Arousal/Alertness: Awake/alert Orientation Level: Oriented X4 Attention: Sustained, Selective Sustained Attention: Impaired Sustained Attention Impairment: Verbal basic, Functional basic Selective Attention: Impaired Selective Attention Impairment: Verbal basic, Functional basic Cognition Arousal/Alertness: Awake/alert Behavior During Therapy: WFL for tasks assessed/performed Overall Cognitive Status: Impaired/Different from baseline Area of Impairment: Attention, Following commands, Safety/judgement, Problem solving Current Attention Level: Sustained Memory: Decreased short-term memory Following Commands: Follows multi-step  commands inconsistently, Follows multi-step commands with increased time Safety/Judgement: Decreased awareness of safety, Decreased awareness of deficits Awareness: Emergent Problem Solving: Slow processing, Difficulty sequencing, Requires verbal cues, Requires tactile cues General Comments: administerred DGI and pt required multiple v/c's for each task on assessment   Blood pressure (!) 156/75, pulse 78, temperature 98.4 F (36.9 C), temperature source Oral, resp. rate 20, height _0  (1.575 m), weight 88.1 kg (194 lb 4.8 oz), SpO2 100 %. Physical Exam  Nursing note and vitals reviewed. Constitutional: She is oriented to person, place, and time. She appears well-developed and well-nourished.  HENT:  Head: Normocephalic and atraumatic.  Mouth/Throat: Oropharynx is clear and moist.  Eyes: Conjunctivae and EOM are normal. Pupils are equal, round, and reactive to light.  Neck: Normal range of motion. Neck supple.  Cardiovascular: Normal rate and regular rhythm.   No murmur heard. Respiratory: Effort normal and breath sounds normal. No stridor. No respiratory distress. She has no wheezes.  GI: Soft. Bowel sounds are normal. She exhibits no distension. There is no tenderness.  Musculoskeletal: She exhibits no edema or tenderness.  Neurological: She is alert and oriented to person, place, and time. A cranial  nerve deficit is present.  Moderate dysarthria with expressive deficits. Right facial weakness.  Speech slurred. Has some motor apraxia as well. Able to follow simple one and two step commands.  Motor  UE 4/5 prox to distal with slightly less strength on left. LE: 4/5 prox to distal. Sensation intact   Skin: Skin is warm and dry.  Psychiatric: She has a normal mood and affect. Her behavior is normal.    Results for orders placed or performed during the hospital encounter of 10/09/16 (from the past 48 hour(s))  CBC     Status: Abnormal   Collection Time: 10/10/16 11:08 AM  Result Value  Ref Range   WBC 8.5 4.0 - 10.5 K/uL   RBC 4.16 3.87 - 5.11 MIL/uL   Hemoglobin 11.7 (L) 12.0 - 15.0 g/dL   HCT 36.0 36.0 - 46.0 %   MCV 86.5 78.0 - 100.0 fL   MCH 28.1 26.0 - 34.0 pg   MCHC 32.5 30.0 - 36.0 g/dL   RDW 13.5 11.5 - 15.5 %   Platelets 317 150 - 400 K/uL  Glucose, capillary     Status: Abnormal   Collection Time: 10/10/16 11:10 AM  Result Value Ref Range   Glucose-Capillary 116 (H) 65 - 99 mg/dL  Glucose, capillary     Status: None   Collection Time: 10/10/16  3:32 PM  Result Value Ref Range   Glucose-Capillary 88 65 - 99 mg/dL  Glucose, capillary     Status: None   Collection Time: 10/10/16  7:19 PM  Result Value Ref Range   Glucose-Capillary 85 65 - 99 mg/dL  Glucose, capillary     Status: None   Collection Time: 10/10/16 11:11 PM  Result Value Ref Range   Glucose-Capillary 82 65 - 99 mg/dL   Comment 1 Notify RN    Comment 2 Document in Chart   Glucose, capillary     Status: None   Collection Time: 10/11/16  4:27 AM  Result Value Ref Range   Glucose-Capillary 97 65 - 99 mg/dL   Comment 1 Notify RN    Comment 2 Document in Chart   CBC with Differential/Platelet     Status: Abnormal   Collection Time: 10/11/16  4:37 AM  Result Value Ref Range   WBC 6.5 4.0 - 10.5 K/uL   RBC 4.44 3.87 - 5.11 MIL/uL   Hemoglobin 13.2 12.0 - 15.0 g/dL   HCT 39.5 36.0 - 46.0 %   MCV 89.0 78.0 - 100.0 fL   MCH 29.7 26.0 - 34.0 pg   MCHC 33.4 30.0 - 36.0 g/dL   RDW 13.8 11.5 - 15.5 %   Platelets 113 (L) 150 - 400 K/uL    Comment: REPEATED TO VERIFY DELTA CHECK NOTED SPECIMEN CHECKED FOR CLOTS PLATELET COUNT CONFIRMED BY SMEAR    Neutrophils Relative % 50 %   Neutro Abs 3.3 1.7 - 7.7 K/uL   Lymphocytes Relative 37 %   Lymphs Abs 2.4 0.7 - 4.0 K/uL   Monocytes Relative 8 %   Monocytes Absolute 0.6 0.1 - 1.0 K/uL   Eosinophils Relative 4 %   Eosinophils Absolute 0.2 0.0 - 0.7 K/uL   Basophils Relative 1 %   Basophils Absolute 0.1 0.0 - 0.1 K/uL  Basic metabolic panel      Status: None   Collection Time: 10/11/16  4:37 AM  Result Value Ref Range   Sodium 140 135 - 145 mmol/L   Potassium 4.7 3.5 - 5.1 mmol/L  Comment: SLIGHT HEMOLYSIS   Chloride 106 101 - 111 mmol/L   CO2 22 22 - 32 mmol/L   Glucose, Bld 89 65 - 99 mg/dL   BUN 12 6 - 20 mg/dL   Creatinine, Ser 0.75 0.44 - 1.00 mg/dL   Calcium 9.0 8.9 - 10.3 mg/dL   GFR calc non Af Amer >60 >60 mL/min   GFR calc Af Amer >60 >60 mL/min    Comment: (NOTE) The eGFR has been calculated using the CKD EPI equation. This calculation has not been validated in all clinical situations. eGFR's persistently <60 mL/min signify possible Chronic Kidney Disease.    Anion gap 12 5 - 15  Glucose, capillary     Status: None   Collection Time: 10/11/16  7:45 AM  Result Value Ref Range   Glucose-Capillary 93 65 - 99 mg/dL   Comment 1 Notify RN    Comment 2 Document in Chart   Glucose, capillary     Status: None   Collection Time: 10/11/16 11:46 AM  Result Value Ref Range   Glucose-Capillary 95 65 - 99 mg/dL   Comment 1 Notify RN    Comment 2 Document in Chart   Glucose, capillary     Status: Abnormal   Collection Time: 10/11/16  4:51 PM  Result Value Ref Range   Glucose-Capillary 120 (H) 65 - 99 mg/dL   Comment 1 Notify RN    Comment 2 Document in Chart   Glucose, capillary     Status: Abnormal   Collection Time: 10/11/16  7:47 PM  Result Value Ref Range   Glucose-Capillary 112 (H) 65 - 99 mg/dL   Comment 1 Notify RN    Comment 2 Document in Chart   Glucose, capillary     Status: Abnormal   Collection Time: 10/12/16 12:11 AM  Result Value Ref Range   Glucose-Capillary 121 (H) 65 - 99 mg/dL   Comment 1 Notify RN    Comment 2 Document in Chart   Glucose, capillary     Status: None   Collection Time: 10/12/16  3:36 AM  Result Value Ref Range   Glucose-Capillary 92 65 - 99 mg/dL   Comment 1 Notify RN    Comment 2 Document in Chart   Glucose, capillary     Status: Abnormal   Collection Time: 10/12/16   8:07 AM  Result Value Ref Range   Glucose-Capillary 120 (H) 65 - 99 mg/dL   Comment 1 Notify RN    Comment 2 Document in Chart    Mr Brain Wo Contrast  Result Date: 10/10/2016 CLINICAL DATA:  Expressive aphasia.  Left hemiparesis. EXAM: MRI HEAD WITHOUT CONTRAST TECHNIQUE: Multiplanar, multiecho pulse sequences of the brain and surrounding structures were obtained without intravenous contrast. COMPARISON:  Head CT 10/10/2016 and MRI 03/21/2016 FINDINGS: Brain: There is an acute right lateral lenticulostriate territory infarct involving the posterior lentiform nucleus and a portion of the corona radiata corresponding to the infarct described on recent CT. Additionally, there is a 2 cm region of acute to early subacute infarction more superiorly and posteriorly involving right parietal white matter. The small right occipital infarct is chronic though new from 03/2016. There also chronic lacunar infarcts in the basal ganglia bilaterally, left thalamus, deep cerebral white matter bilaterally, and anterior corpus callosum. Confluent cerebral white matter T2 hyperintensities are compatible with extensive chronic small vessel ischemic disease. Milder chronic small vessel ischemia is again noted in the pons. No intracranial hemorrhage, mass, midline shift, or extra-axial fluid  collection is identified. There is moderate cerebral atrophy. Vascular: Major intracranial vascular flow voids are preserved. Skull and upper cervical spine: Unremarkable bone marrow signal. Sinuses/Orbits: Bilateral cataract extraction. Clear paranasal sinuses. Trace left mastoid effusion. Other: None. IMPRESSION: 1. Acute infarcts in the right basal ganglia and anterior right parietal white matter. 2. Chronic right occipital cortical infarct and extensive chronic small vessel ischemic changes. Electronically Signed   By: Logan Bores M.D.   On: 10/10/2016 14:33   Dg Chest Port 1 View  Result Date: 10/10/2016 CLINICAL DATA:  Cough. EXAM:  PORTABLE CHEST 1 VIEW COMPARISON:  11/06/2015 FINDINGS: The heart size and mediastinal contours are within normal limits. Both lungs are clear. The visualized skeletal structures are unremarkable. IMPRESSION: No active disease. Electronically Signed   By: Kerby Moors M.D.   On: 10/10/2016 11:35   Dg Swallowing Func-speech Pathology  Result Date: 10/11/2016 Objective Swallowing Evaluation: Type of Study: MBS-Modified Barium Swallow Study Patient Details Name: Susan Patel MRN: [id number] Date of Birth: June 02, 1940 Today's Date: 10/11/2016 Time: SLP Start Time (ACUTE ONLY): 1315-SLP Stop Time (ACUTE ONLY): 1333 SLP Time Calculation (min) (ACUTE ONLY): 18 min Past Medical History: Past Medical History: Diagnosis Date . Arthritis   'all over" . Asthma  . Chronic pain  . COPD (chronic obstructive pulmonary disease) (HCC)   O2 per [place] at nights  . Depression  . Diabetes mellitus without complication ([place])   borderline- , states she was on med., but MD told her "everything is under control so I threw the bottle away" . Fibromyalgia  . GERD (gastroesophageal reflux disease)  . Hypertension  . Neuromuscular disorder (HCC)   parkinson, neuropathy- both feet & hands. . Stroke [facility])   residual dysarthria Past Surgical History: Past Surgical History: Procedure Laterality Date . ABDOMINAL HYSTERECTOMY   . ANTERIOR CERVICAL DECOMP/DISCECTOMY FUSION N/A 07/10/2015  Procedure: Cervical five-six, Cervical six-seven anterior cervical decompression with fusion plating and bonegraft;  Surgeon: Jovita Gamma, MD;  Location: [place] [facility];  Service: Neurosurgery;  Laterality: N/A; . APPENDECTOMY   . BIOPSY EYE MUSCLE  03/20/2016  biopsy vessel to right eye due to swelling . EYE SURGERY Bilateral   cataracts removed, /w "cyrstal lenses"  . SHOULDER ARTHROSCOPY Right   x2   RCR- spurs removed  HPI: Yuliana Vandrunen Knightis a 76 y.o.femalewith history of T2DM with neuropathy, HTN, fibromyalgia--chronic pain, COPD--oxygen at nights,right  visual deficits with recent CVA (left internal capsule) 03/2016 and resultant mild pharyngeal phase dysphagia (MBS showed swallow delay to pyriform sinus with thin and nectar-thick liquids). Pt with CIR, OP therapy d/c from OP 05/31/16 on regular diet with thin liquids. Admitted 10/09/16, CT shows suspected acute right basal ganglia infarct, new left basal ganglia lacunar infarct, possibly subacute right occipital infarct. Subjective: alert, significantly dysarthric Assessment / Plan / Recommendation CHL IP CLINICAL IMPRESSIONS 10/11/2016 Clinical Impression Pt has a moderate, primarily oral dysphagia characterized by oral holding, lingual pumping, piecemeal swallows, and decreased bolus cohesion. Mod cues were provided throughout study for pt to initiate oral transit. She is not able to obtain liquids via straw and has mild-moderate anterior loss with cup sips. Nectar thick liquids are aspirated as they spill prematurely into her pharynx/larynx. She triggers a consistent cough response, which is protective enough to clear most of her aspirates but not all of them. Recommend Dys 1 diet and honey thick liquids by cup with meds crushed in puree. Suspect that consuming an entire meal tray may be a laborous task for her,  so she will likely benefit from smaller, more frequent intake and rest breaks. SLP Visit Diagnosis Dysphagia, oral phase (R13.11) Attention and concentration deficit following -- Frontal lobe and executive function deficit following -- Impact on safety and function Moderate aspiration risk   CHL IP TREATMENT RECOMMENDATION 10/11/2016 Treatment Recommendations Therapy as outlined in treatment plan below   Prognosis 10/11/2016 Prognosis for Safe Diet Advancement Good Barriers to Reach Goals Severity of deficits Barriers/Prognosis Comment -- CHL IP DIET RECOMMENDATION 10/11/2016 SLP Diet Recommendations Dysphagia 1 (Puree) solids;Honey thick liquids Liquid Administration via Cup;No straw Medication  Administration Whole meds with puree Compensations Slow rate;Small sips/bites;Follow solids with liquid Postural Changes Remain semi-upright after after feeds/meals (Comment);Seated upright at 90 degrees   CHL IP OTHER RECOMMENDATIONS 10/11/2016 Recommended Consults -- Oral Care Recommendations Oral care BID Other Recommendations Order thickener from pharmacy;Prohibited food (jello, ice cream, thin soups);Remove water pitcher;Have oral suction available   CHL IP FOLLOW UP RECOMMENDATIONS 10/11/2016 Follow up Recommendations Inpatient Rehab   CHL IP FREQUENCY AND DURATION 10/11/2016 Speech Therapy Frequency (ACUTE ONLY) min 2x/week Treatment Duration 2 weeks      CHL IP ORAL PHASE 10/11/2016 Oral Phase Impaired Oral - Pudding Teaspoon -- Oral - Pudding Cup -- Oral - Honey Teaspoon -- Oral - Honey Cup Holding of bolus;Piecemeal swallowing;Lingual pumping;Decreased bolus cohesion;Premature spillage;Delayed oral transit;Lingual/palatal residue Oral - Nectar Teaspoon -- Oral - Nectar Cup Holding of bolus;Piecemeal swallowing;Lingual pumping;Decreased bolus cohesion;Premature spillage;Delayed oral transit;Lingual/palatal residue Oral - Nectar Straw -- Oral - Thin Teaspoon -- Oral - Thin Cup -- Oral - Thin Straw -- Oral - Puree Holding of bolus;Piecemeal swallowing;Lingual pumping;Decreased bolus cohesion;Premature spillage;Delayed oral transit;Lingual/palatal residue Oral - Mech Soft -- Oral - Regular -- Oral - Multi-Consistency -- Oral - Pill -- Oral Phase - Comment --  CHL IP PHARYNGEAL PHASE 10/11/2016 Pharyngeal Phase Impaired Pharyngeal- Pudding Teaspoon -- Pharyngeal -- Pharyngeal- Pudding Cup -- Pharyngeal -- Pharyngeal- Honey Teaspoon -- Pharyngeal -- Pharyngeal- Honey Cup WFL Pharyngeal -- Pharyngeal- Nectar Teaspoon -- Pharyngeal -- Pharyngeal- Nectar Cup Penetration/Aspiration before swallow Pharyngeal Material enters airway, passes BELOW cords and not ejected out despite cough attempt by patient Pharyngeal- Nectar  Straw -- Pharyngeal -- Pharyngeal- Thin Teaspoon -- Pharyngeal -- Pharyngeal- Thin Cup -- Pharyngeal -- Pharyngeal- Thin Straw -- Pharyngeal -- Pharyngeal- Puree WFL Pharyngeal -- Pharyngeal- Mechanical Soft -- Pharyngeal -- Pharyngeal- Regular -- Pharyngeal -- Pharyngeal- Multi-consistency -- Pharyngeal -- Pharyngeal- Pill -- Pharyngeal -- Pharyngeal Comment --  CHL IP CERVICAL ESOPHAGEAL PHASE 10/11/2016 Cervical Esophageal Phase WFL Pudding Teaspoon -- Pudding Cup -- Honey Teaspoon -- Honey Cup -- Nectar Teaspoon -- Nectar Cup -- Nectar Straw -- Thin Teaspoon -- Thin Cup -- Thin Straw -- Puree -- Mechanical Soft -- Regular -- Multi-consistency -- Pill -- Cervical Esophageal Comment -- CHL IP GO 05/31/2016 Functional Assessment Tool Used skilled observation  Functional Limitations Spoken language expressive Swallow Current Status [phone]) (None) Swallow Goal Status (C9470) (None) Swallow Discharge Status (J6283) (None) Motor Speech Current Status (M6294) (None) Motor Speech Goal Status (T6546) (None) Motor Speech Goal Status (T0354) (None) Spoken Language Comprehension Current Status (S5681) (None) Spoken Language Comprehension Goal Status (E7517) (None) Spoken Language Comprehension Discharge Status [phone]) (None) Spoken Language Expression Current Status (B4496) (None) Spoken Language Expression Goal Status (P5916) CJ Spoken Language Expression Discharge Status (B8466) CI Attention Current Status (Z9935) (None) Attention Goal Status (T0177) (None) Attention Discharge Status (L3903) (None) Memory Current Status (E0923) (None) Memory Goal Status (R0076) (None) Memory Discharge Status (A2633) (None) Voice  Current Status [phone]) (None) Voice Goal Status [phone]) (None) Voice Discharge Status [phone]) (None) Other Speech-Language Pathology Functional Limitation Current Status [phone]) (None) Other Speech-Language Pathology Functional Limitation Goal Status (R1165) (None) Other Speech-Language Pathology Functional  Limitation Discharge Status [phone]) (None) Germain Osgood 10/11/2016, 2:21 PM  Germain Osgood, M.A. CCC-SLP [phone]                 Medical Problem List and Plan: 1.  Functional and swallowing deficits secondary to right basal ganglia /parietal infarcts  -admit to inpatient rehab 2.  DVT Prophylaxis/Anticoagulation: Pharmaceutical: Lovenox 3. Chronic pain/Pain Management: continue tramadol prn. Resume Neurontin as indicating increase pain in hands/feet.  4. Mood: LCSW to follow for evaluation and support.  5. Neuropsych: This patient is not fully capable of making decisions on her own behalf. 6. Skin/Wound Care: routine pressure relief measures.  7. Fluids/Electrolytes/Nutrition: Monitor I/O. Check lytes in am. n adequate hydration-- change to HS. Encourage fluid intake during the day.  Will continue to monitor for now.  8. T2DM: Hgb A1c- 6.3. Resume amaryl and monitor BS ac/hs. Use SSI for elevated BS and titrated meds as needed for tighter control.  9. COPD: Uses 3 L oxygen at nights.  10. Insomnia: continue trazodone prn.  11. Constipation: Resume linzess.  12. Dyslipidemia : On Crestor.  13. COPD: Resume Breo. Continue 3 L oxygen at nights.  14. HTN: Monitor BP bid with permissive HTN for few more days. Continue to hold losartan/HCTZ 15. Dysphagia: On dysphagia 1, honey liquids by tsp. Add IVF at nights to help maintain adequate hydration.    Post Admission Physician Evaluation: 1. Functional deficits secondary  to right basal ganglia and parietal infarcts. 2. Patient is admitted to receive collaborative, interdisciplinary care between the physiatrist, rehab nursing staff, and therapy team. 3. Patient's level of medical complexity and substantial therapy needs in context of that medical necessity cannot be provided at a lesser intensity of care such as a SNF. 4. Patient has experienced substantial functional loss from his/her baseline which was documented above under the  "Functional History" and "Functional Status" headings.  Judging by the patient's diagnosis, physical exam, and functional history, the patient has potential for functional progress which will result in measurable gains while on inpatient rehab.  These gains will be of substantial and practical use upon discharge  in facilitating mobility and self-care at the household level. 5. Physiatrist will provide 24 hour management of medical needs as well as oversight of the therapy plan/treatment and provide guidance as appropriate regarding the interaction of the two. 6. The Preadmission Screening has been reviewed and patient status is unchanged unless otherwise stated above. 7. 24 hour rehab nursing will assist with bladder management, bowel management, safety, skin/wound care, disease management, medication administration, pain management and patient education  and help integrate therapy concepts, techniques,education, etc. 8. PT will assess and treat for/with: Lower extremity strength, range of motion, stamina, balance, functional mobility, safety, adaptive techniques and equipment, NMR, family education.   Goals are: mod I to supervision. 9. OT will assess and treat for/with: ADL's, functional mobility, safety, upper extremity strength, adaptive techniques and equipment, NMR, family education, community reintegration.   Goals are: mod I to supervision. Therapy may proceed with showering this patient. 10. SLP will assess and treat for/with: cognition, communication, swallowing, family education.  Goals are: mod I to supervision. 11. Case Management and Social Worker will assess and treat for psychological issues and discharge planning. 12. Team conference will be held weekly  to assess progress toward goals and to determine barriers to discharge. 13. Patient will receive at least 3 hours of therapy per day at least 5 days per week. 14. ELOS: 7 days       15. Prognosis:  excellent     Meredith Staggers,  MD, [facility] 10/12/2016  Bary Leriche, Hershal Coria 10/12/2016

## 2016-10-12 NOTE — Care Management Note (Signed)
Case Management Note  Patient Details  Name: TOSHIA LARKIN MRN: [id number] Date of Birth: 25-Jun-1940  Subjective/Objective:                    Action/Plan: Patient discharging to CIR today. No further needs per CM.   Expected Discharge Date:                  Expected Discharge Plan:  [place]  In-House Referral:     Discharge planning Services     Post Acute Care Choice:    Choice offered to:     DME Arranged:    DME Agency:     HH Arranged:    [place] Agency:     Status of Service:  Completed, signed off  If discussed at [employer] of Stay Meetings, dates discussed:    Additional Comments:  Pollie Friar, RN 10/12/2016, 12:04 PM

## 2016-10-12 NOTE — Progress Notes (Signed)
Physical Therapy Treatment Patient Details Name: Susan Patel MRN: [id number] DOB: Apr 01, 1941 Today's Date: 10/12/2016    History of Present Illness 76 yo with PMH significant for HTN, DM, fibropmyalgia, previous CVA (L internal capsule) with new onset of L sided weaknessa nd increased difficulty speaking. +tPA. Pt with new L BG lacunar infarct of indeterminant age New R occipital infarct possibly acute and acute R BG infarct.     PT Comments    Pt progressing towards goals however continues to require minA for safe ambulation as she demonstrates significant balance impairment as indicated by score of 13 on DGI. Pt also with noted decreased insight to deficits and safety. Pt to strongly benefit from CIR to achieve safe supervision level of function for safe transition home with spouse.  Follow Up Recommendations  CIR     Equipment Recommendations  None recommended by PT    Recommendations for Other Services Rehab consult     Precautions / Restrictions Precautions Precautions: Fall Precaution Comments: full supervision for eating, puree diet, honey thick liquids Restrictions Weight Bearing Restrictions: No    Mobility  Bed Mobility               General bed mobility comments: pt up in chair upon PT arrival  Transfers Overall transfer level: Needs assistance   Transfers: Sit to/from Stand Sit to Stand: Min assist         General transfer comment: v/c's to push up from chair, v/c's to slow down, posterior lean initially  Ambulation/Gait Ambulation/Gait assistance: Min assist Ambulation Distance (Feet): 150 Feet Assistive device: 1 person hand held assist Gait Pattern/deviations: Step-through pattern;Decreased stride length;Shuffle;Wide base of support;Staggering left;Staggering right Gait velocity: varied between slow and fast   General Gait Details: pt unsteady requiring minA to maintain balance. pt with staggering L/R despite wide base of support and  decreased step height   Stairs Stairs: Yes   Stair Management: One rail Right Number of Stairs: 12 General stair comments: pt required minA and increased time. v/c's for step to pattern as pt unable to step up with R LE due to weakness  Wheelchair Mobility    Modified Rankin (Stroke Patients Only) Modified Rankin (Stroke Patients Only) Pre-Morbid Rankin Score: Moderate disability Modified Rankin: Moderately severe disability     Balance                                 Standardized Balance Assessment Standardized Balance Assessment : Dynamic Gait Index   Dynamic Gait Index Level Surface: Mild Impairment Change in Gait Speed: Mild Impairment Gait with Horizontal Head Turns: Mild Impairment Gait with Vertical Head Turns: Moderate Impairment Gait and Pivot Turn: Moderate Impairment Step Over Obstacle: Mild Impairment Step Around Obstacles: Mild Impairment Steps: Moderate Impairment Total Score: 13      Cognition Arousal/Alertness: Awake/alert Behavior During Therapy: WFL for tasks assessed/performed Overall Cognitive Status: Impaired/Different from baseline Area of Impairment: Attention;Following commands;Safety/judgement;Problem solving                   Current Attention Level: Sustained Memory: Decreased short-term memory Following Commands: Follows multi-step commands inconsistently;Follows multi-step commands with increased time Safety/Judgement: Decreased awareness of safety;Decreased awareness of deficits Awareness: Emergent Problem Solving: Slow processing;Difficulty sequencing;Requires verbal cues;Requires tactile cues General Comments: administerred DGI and pt required multiple v/c's for each task on assessment      Exercises      General Comments  Pertinent Vitals/Pain Pain Assessment: No/denies pain    Home Living                      Prior Function            PT Goals (current goals can now be found in  the care plan section) Progress towards PT goals: Progressing toward goals    Frequency    Min 4X/week      PT Plan Current plan remains appropriate    Co-evaluation              AM-PAC PT "6 Clicks" Daily Activity  Outcome Measure  Difficulty turning over in bed (including adjusting bedclothes, sheets and blankets)?: A Lot Difficulty moving from lying on back to sitting on the side of the bed? : A Lot Difficulty sitting down on and standing up from a chair with arms (e.g., wheelchair, bedside commode, etc,.)?: A Lot Help needed moving to and from a bed to chair (including a wheelchair)?: A Little Help needed walking in hospital room?: A Little Help needed climbing 3-5 steps with a railing? : A Lot 6 Click Score: 14    End of Session Equipment Utilized During Treatment: Gait belt Activity Tolerance: Patient tolerated treatment well Patient left: in chair;with call bell/phone within reach;with family/visitor present Nurse Communication: Mobility status PT Visit Diagnosis: Unsteadiness on feet (R26.81);Other symptoms and signs involving the nervous system (S92.330)     Time: 0762-2633 PT Time Calculation (min) (ACUTE ONLY): 24 min  Charges:  $Gait Training: 23-37 mins                    G Codes:       Kittie Plater, PT, DPT Pager #: [phone] Office #: [phone]   [place] 10/12/2016, 9:40 AM

## 2016-10-12 NOTE — Progress Notes (Signed)
  Speech Language Pathology Treatment: Dysphagia;Cognitive-Linquistic  Patient Details Name: Susan Patel MRN: [id number] DOB: 1941-03-14 Today's Date: 10/12/2016 Time: 9728-2060 SLP Time Calculation (min) (ACUTE ONLY): 24 min  Assessment / Plan / Recommendation Clinical Impression  Pt's speech continues to show slow improvements. Today she is speaking at the word and short-phrase level but with intelligibility moderately reduced within context of conversation. She is oriented x4 but shows reduced emergent awareness, particularly of swallowing difficulties. She consumed Dys 1 textures and honey thick liquids with Min cues for oral transit. Coughing was observed after trials of honey thick liquids, which was reduced but not eliminated with presentation by spoon. NT and RN report that pt did well with breakfast without significant coughing. Recommend to continue Dys 1 diet but with honey thick liquids administered by spoon under full supervision. Should she continue to have coughing, would hold honey thick liquids and continue with purees only (discussed with NT).   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 Pt with CIR, OP therapy d/c from OP 05/31/16 on regular diet with thin liquids. Admitted 10/09/16, CT shows suspected acute right basal ganglia infarct, new left basal ganglia lacunar infarct, possibly subacute right occipital infarct.      SLP Plan  Continue with current plan of care       Recommendations  Diet recommendations: Dysphagia 1 (puree);Honey-thick liquid Liquids provided via: Teaspoon Medication Administration: Crushed with puree Supervision: Patient able to self feed;Full supervision/cueing  for compensatory strategies Compensations: Slow rate;Small sips/bites;Other (Comment) (cue to swallow PRN) Postural Changes and/or Swallow Maneuvers: Seated upright 90 degrees;Upright 30-60 min after meal                Oral Care Recommendations: Oral care BID Follow up Recommendations: Inpatient Rehab SLP Visit Diagnosis: Dysphagia, oropharyngeal phase (R13.12);Cognitive communication deficit (R56.153) Plan: Continue with current plan of care       GO                Germain Osgood 10/12/2016, 12:05 PM  Germain Osgood, M.A. CCC-SLP [phone]

## 2016-10-12 NOTE — Progress Notes (Signed)
I have insurance approval to admit pt to inpt rehab today. I have left a message for her spouse for he is at an appointment this morning. RN CM as well as Dr. Leonie Man are aware. I will make the arrangements to admit pt today. [phone]

## 2016-10-12 NOTE — Progress Notes (Signed)
STROKE TEAM PROGRESS NOTE   HISTORY OF PRESENT ILLNESS (per record) Susan Patel is a 76 y.o. female with a historyof previous stroke who was in her normal state of health at bedtime last night. Her daughter reported that she spoke with her at 10am and that she was in her normal state of health at that time. Subsequently she endorsed through head shaking that she was having more difficulty walking even on awakening, but this was after tpa.   She was speaking still and apparently normal enough at10am to be reported as normal. She went to breakfast after this and needed a little more help to get in her chair. She then was having some trouble swallowing and speaking and therefor 911 was called.    LKW: initially reported as 10am, but I suspect 5/5 prior to bed.  tpa given?: yes Saturday 10/09/2016 at 1415 ROS:  Unable to obtain due to altered mental status.     SUBJECTIVE (INTERVAL HISTORY) Patient's husband is at bedside.   She continues to improve but does have persistent left-sided weakness. She has been seen by inpatient rehabilitation and felt to be a good candidate. Echocardiogram is unremarkable. OBJECTIVE Temp:  [98.4 F (36.9 C)-99 F (37.2 C)] 98.4 F (36.9 C) (05/29 0505) Pulse Rate:  [68-85] 78 (05/29 0505) Cardiac Rhythm: Normal sinus rhythm (05/29 0859) Resp:  [16-20] 20 (05/29 0505) BP: (154-178)/(63-75) 156/75 (05/29 0505) SpO2:  [100 %] 100 % (05/29 0505)  CBC:  Recent Labs Lab 10/09/16 1235  10/10/16 1108 10/11/16 0437  WBC 7.8  --  8.5 6.5  NEUTROABS 4.9  --   --  3.3  HGB 12.5  < > 11.7* 13.2  HCT 37.8  < > 36.0 39.5  MCV 87.3  --  86.5 89.0  PLT 286  --  317 113*  < > = values in this interval not displayed.  Basic Metabolic Panel:   Recent Labs Lab 10/09/16 1235 10/09/16 1242 10/11/16 0437  NA 139 137 140  K 3.5 3.4* 4.7  CL 99* 97* 106  CO2 29  --  22  GLUCOSE 112* 108* 89  BUN 29* 27* 12  CREATININE 0.97 1.00 0.75  CALCIUM 9.3  --  9.0     Lipid Panel:     Component Value Date/Time   CHOL 154 10/10/2016 0141   CHOL 175 09/15/2016 1723   TRIG 104 10/10/2016 0141   HDL 43 10/10/2016 0141   HDL 44 09/15/2016 1723   CHOLHDL 3.6 10/10/2016 0141   VLDL 21 10/10/2016 0141   LDLCALC 90 10/10/2016 0141   LDLCALC 97 09/15/2016 1723   HgbA1c:  Lab Results  Component Value Date   HGBA1C 6.3 (H) 10/10/2016   Urine Drug Screen:     Component Value Date/Time   LABOPIA NONE DETECTED 10/09/2016 1350   COCAINSCRNUR NONE DETECTED 10/09/2016 1350   LABBENZ NONE DETECTED 10/09/2016 1350   AMPHETMU NONE DETECTED 10/09/2016 1350   THCU NONE DETECTED 10/09/2016 1350   LABBARB NONE DETECTED 10/09/2016 1350    Alcohol Level     Component Value Date/Time   ETH <5 10/09/2016 1235    IMAGING  Ct Angio Head W/cm &/or Wo Cm Ct Angio Neck W And/or Wo Contrast 10/09/2016 1. No emergent large vessel occlusion.  2. Progressive, advanced intracranial atherosclerosis including severe stenoses involving both supraclinoid ICAs, MCA bifurcation regions, distal left vertebral artery, proximal PCAs, and numerous anterior and posterior circulation branch vessels. Right PCA branch vessel occlusion corresponding  to the previously described infarct.  3. No cervical carotid artery stenosis.     Ct Head Wo Contrast 10/10/2016 No intracranial hemorrhage or acute intracranial process. Stable age indeterminate basal ganglia and thalamus lacunar infarcts. Subacute to old RIGHT occipital lobe/ PCA territory infarct. Severe chronic small vessel ischemic disease.     Ct Head Code Stroke W/o Cm 10/09/2016 1. Suspected acute right basal ganglia infarct.  2. New left basal ganglia lacunar infarct, of indeterminate age.  3. New right occipital lobe infarct, possibly subacute.  4. Extensive chronic small vessel ischemic disease.  5. No acute intracranial hemorrhage.     Physical exam: Exam: Gen: Obese elderly Caucasian lady not in distress           CV: RRR, no MRG. No Carotid Bruits. No peripheral edema, warm, nontender Eyes: Conjunctivae clear without exudates or hemorrhage  Neuro: Detailed Neurologic Exam  Speech:    Speech is clear without evidence of dysarthria or aphasia  Cognition:    The patient is oriented to person, month, year. Can follow  Commands well.    Cranial Nerves:    The pupils are equal, round, and reactive to light. Visual fields are full to finger confrontation. Extraocular movements are intact. Trigeminal sensation is intact and the muscles of mastication are normal. The face is symmetric. The palate elevates in the midline. Hearing intact. Voice is normal. Shoulder shrug is normal. The tongue has normal motion without fasciculations.   Coordination:    No dysmetria noted   Gait:    Heel-toe and tandem gait are normal.   Motor Observation:    no involuntary movements noted. Tone:    Normal muscle tone.     Strength:    Mild Left arm and leg weakness 4+/5. Right side intact.      Sensation: intact to LT       ASSESSMENT/PLAN [name] is a 76 y.o. female with history of previous stroke, hypertension, neuromuscular disorder, fibromyalgia, diabetes mellitus, COPD, chronic pain, neuropathy, and depression presenting with gait disturbance, occult swallowing, and speech difficulties.  The patient received TPA - Saturday 10/09/2016 at 1415   Stroke:  Right basal ganglia infarct. Likely due to small vessel disease.  Resultant  Left hemiparesis   CT head - multiple subacute to old infarcts. MRI head - 1. Acute infarcts in the right basal ganglia and anterior right parietal white matter..2. Chronic right occipital cortical infarct and extensive chronic  small vessel ischemic changes  MRA head - not performed  CTA H&N - Severe, progressive, advanced intracranial atherosclerosis as above.  Carotid Doppler - CTA neck 2D Echo -Left ventricle: The cavity size was normal. There was mild    concentric hypertrophy. Systolic function was normal. The   estimated ejection fraction was in the range of 60% to 65%. Wall   motion was normal; there were no regional wall motion    abnormalities  LDL - 90  HgbA1c - 6.3  VTE prophylaxis DIET - DYS 1 Room service appropriate? Yes; Fluid consistency: Honey Thick  aspirin 325 mg daily prior to admission, now on No antithrombotic secondary to TPA therapy.  Patient counseled to be compliant with her antithrombotic medications  Ongoing aggressive stroke risk factor management  Therapy recommendations:  CLR  Disposition: Pending  Hypertension  Stable - avoid hypotension  Permissive hypertension (OK if < 220/120) but gradually normalize in 5-7 days  Long-term BP goal normotensive  Hyperlipidemia  Home meds:  Crestor 40 mg daily -  not resumed in hospital  LDL 90, goal < 70  Resume Crestor 40 mg daily (max dose) when PO access is available.  Continue statin at discharge  Diabetes  HgbA1c pending, goal < 7.0  Unc / Controlled  Other Stroke Risk Factors  Advanced age  Obesity, Body mass index is 35.54 kg/m., recommend weight loss, diet and exercise as appropriate   Hx stroke/TIA   Other Active Problems  Mild hypokalemia - 3.4 - supplement - recheck Tuesday.  Possible aspiration per nursing - CXR and CBC (afebrile - temp 99.9 yesterday)  Hospital day # 3  Personally examined patient and images, and have participated in and made any corrections needed to history, physical, neuro exam,assessment and plan as stated above.  I have personally obtained the history, evaluated lab date, reviewed imaging studies and agree with radiology interpretations.  Plan ; continue ongoing therapies. Transfer to inpatient rehabilitation over the next few days when bed available. Continue Plavix for secondary stroke prevention. Patient's LDL cholesterol is yet suboptimal despite maximum dose of Crestor and she may benefit with addition  of new PCS 9 inhibitor injections as an outpatient. Discussed with patient and husband and answered questions.   Antony Contras,, MD Stroke Neurology   To contact Stroke Continuity provider, please refer to [email or web address] After hours, contact General Neurology

## 2016-10-12 NOTE — H&P (Signed)
Physical Medicine and Rehabilitation Admission H&P       Chief Complaint  Patient presents with  . Stroke with Left sided weakness, dysphagia, left facial weakness with dysarthria and expressive deficits.     HPI: Susan Grabert Knightis a 76 y.o.femaleCOPD- oxygen dependent, T2DM, Neuropathy, chronic pain, CVA with residual visual deficits?, Parkinsonism; who was admitted on 10/09/16 with inability to speak, left sided weakness and difficulty walking. CT head done revealing suspicion of acute right basal ganglia infarct, subacute right occipital infarct, left basal infarct question age and extensive small vessel disease. She receive tPA and continues on ASA for secondary stroke prevention. MRI brain done revealing acute infarcts right basal ganglia anterior right parietal white matter with chronic right occipital cortical infarct. CTA head/neck showed severe progressive advanced intracranial atherosclerosis. 2 D echo revealed EF 60-65%, with grade one diastolic dysfunction. MBS done yesterday and she was started on dysphagia 1, honey liquids. ASA changed to plavix due to progressive intracranial atherosclerosis  and stroke felt to be thrombotic due to small vessel disease.  She reported severe 5/27 and repeat CT head without acute changes. Patient with severe dysarthria, severe expressive deficits, poor safety as well as decrease in mobility and ability to carry out ADL tasks. CIR recommended by rehab team   Review of Systems  Constitutional: Negative for chills and fever.  HENT: Negative for hearing loss and tinnitus.   Eyes:       Decreased vision right eye from prior stroke  Respiratory: Negative for cough and shortness of breath.   Cardiovascular: Negative for chest pain, palpitations and leg swelling.  Gastrointestinal: Negative for heartburn and nausea.  Genitourinary: Negative for dysuria and urgency.  Musculoskeletal: Positive for back pain and myalgias.  Skin: Negative for  itching and rash.  Neurological: Positive for sensory change (increase pain), speech change and focal weakness.  Psychiatric/Behavioral: The patient is nervous/anxious.           Past Medical History:  Diagnosis Date  . Arthritis    'all over"  . Asthma   . Chronic pain   . COPD (chronic obstructive pulmonary disease) (HCC)    O2 per [place] at nights   . Depression   . Diabetes mellitus without complication ([place])    borderline- , states she was on med., but MD told her "everything is under control so I threw the bottle away"  . Fibromyalgia   . GERD (gastroesophageal reflux disease)   . Hypertension   . Neuromuscular disorder (HCC)    parkinson, neuropathy- both feet & hands.  . Stroke [facility])    residual dysarthria         Past Surgical History:  Procedure Laterality Date  . ABDOMINAL HYSTERECTOMY    . ANTERIOR CERVICAL DECOMP/DISCECTOMY FUSION N/A 07/10/2015   Procedure: Cervical five-six, Cervical six-seven anterior cervical decompression with fusion plating and bonegraft;  Surgeon: Jovita Gamma, MD;  Location: [place] [facility];  Service: Neurosurgery;  Laterality: N/A;  . APPENDECTOMY    . BIOPSY EYE MUSCLE  03/20/2016   biopsy vessel to right eye due to swelling  . EYE SURGERY Bilateral    cataracts removed, /w "cyrstal lenses"   . SHOULDER ARTHROSCOPY Right    x2   RCR- spurs removed          Family History  Problem Relation Age of Onset  . Heart disease Mother   . Cancer Father   . Heart disease Father     Social History:  Married.  reports that she has never smoked. She has never used smokeless tobacco. She reports that she does not drink alcohol or use drugs.          Allergies  Allergen Reactions  . Azithromycin Other (See Comments)    Renal failure  . Prednisone Other (See Comments)    Irritability and insomnia  . Sulfamethoxazole-Trimethoprim Other (See Comments)    Renal failure  . Amlodipine      Caused swelling in legs  . Ciprofloxacin Itching and Rash  . Tape Rash          Medications Prior to Admission  Medication Sig Dispense Refill  . aspirin 325 MG EC tablet Take 1 tablet (325 mg total) by mouth daily. 90 tablet 1  . cetirizine (ZYRTEC) 10 MG tablet Take 10 mg by mouth daily.    . DULoxetine (CYMBALTA) 30 MG capsule Take 2 capsules (60 mg total) by mouth daily. (Patient taking differently: Take 60 mg by mouth 2 (two) times daily. ) 60 capsule 3  . esomeprazole (NEXIUM) 20 MG capsule Take 20 mg by mouth daily.    . fluticasone (FLONASE) 50 MCG/ACT nasal spray Place 2 sprays into both nostrils daily. (Patient taking differently: Place 2 sprays into both nostrils daily as needed for allergies. ) 54 g 0  . fluticasone furoate-vilanterol (BREO ELLIPTA) 200-25 MCG/INH AEPB Inhale 1 puff into the lungs daily.    Marland Kitchen gabapentin (NEURONTIN) 300 MG capsule Take 1 capsule (300 mg total) by mouth at bedtime. 30 capsule 3  . glimepiride (AMARYL) 2 MG tablet TAKE 1 TABLET (2 MG TOTAL) BY MOUTH DAILY BEFORE BREAKFAST. 90 tablet 0  . latanoprost (XALATAN) 0.005 % ophthalmic solution Place 1 drop into both eyes at bedtime. 2.5 mL 12  . linaclotide (LINZESS) 72 MCG capsule Take 1 capsule (72 mcg total) by mouth daily before breakfast. 90 capsule 0  . losartan-hydrochlorothiazide (HYZAAR) 100-25 MG tablet Take 1 tablet by mouth daily. 90 tablet 3  . montelukast (SINGULAIR) 10 MG tablet TAKE 1 TABLET (10 MG TOTAL) BY MOUTH AT BEDTIME. 30 tablet 9  . OXYGEN Inhale 3-4 L into the lungs at bedtime.    . rosuvastatin (CRESTOR) 40 MG tablet Take 1 tablet (40 mg total) by mouth every evening. 90 tablet 0  . traMADol (ULTRAM) 50 MG tablet Take 1 tablet (50 mg total) by mouth every 8 (eight) hours as needed for moderate pain or severe pain. (Patient taking differently: Take 50 mg by mouth 2 (two) times daily. ) 90 tablet 2  . traZODone (DESYREL) 50 MG tablet TAKE 1 TABLET (50 MG TOTAL) BY MOUTH  NIGHTLY AS NEEDED FOR FOR SLEEP. 30 tablet 0  . Vitamin D, Ergocalciferol, (DRISDOL) 50000 units CAPS capsule Take 50,000 Units by mouth every Monday.   3  . acetaminophen (TYLENOL) 500 MG tablet Take 1,000 mg by mouth every 8 (eight) hours as needed for mild pain or moderate pain.    Marland Kitchen albuterol (PROVENTIL HFA;VENTOLIN HFA) 108 (90 Base) MCG/ACT inhaler Inhale 1-2 puffs into the lungs every 6 (six) hours as needed for wheezing or shortness of breath. 1 Inhaler 1  . triamcinolone (KENALOG) 0.025 % ointment Apply 1 application topically 2 (two) times daily. (Patient taking differently: Apply 1 application topically 2 (two) times daily as needed. ) 30 g 1    Home: Home Living Family/patient expects to be discharged to:: Private residence Living Arrangements: Spouse/significant other Available Help at Discharge: Available 24 hours/day Type of Home: [place]  Access: Ramped entrance, Stairs to enter [employer] of Steps: 4 Entrance Stairs-Rails: Right Home Layout: Two level, Able to live on main level with bedroom/bathroom Bathroom Shower/Tub: Other (comment) (safe step walk in tub) Bathroom Toilet: Standard Bathroom Accessibility: Yes Home Equipment: Shower seat - built in, [employer] - tub/shower, Radio producer - single point, Bedside commode  Lives With: Spouse   Functional History: Prior Function Level of Independence: Independent  Functional Status:  Mobility: Bed Mobility Overal bed mobility: Needs Assistance Bed Mobility: Supine to Sit Supine to sit: Mod assist, Min assist (min to come up to sitting and mod to help coord. scoot.) General bed mobility comments: pt up in chair upon PT arrival Transfers Overall transfer level: Needs assistance Transfers: Sit to/from Stand Sit to Stand: Min assist Stand pivot transfers: Min assist General transfer comment: v/c's to push up from chair, v/c's to slow down, posterior lean initially Ambulation/Gait Ambulation/Gait  assistance: Min assist Ambulation Distance (Feet): 150 Feet Assistive device: 1 person hand held assist Gait Pattern/deviations: Step-through pattern, Decreased stride length, Shuffle, Wide base of support, Staggering left, Staggering right General Gait Details: pt unsteady requiring minA to maintain balance. pt with staggering L/R despite wide base of support and decreased step height Gait velocity: varied between slow and fast Stairs: Yes Stairs assistance: Min assist Stair Management: One rail Right Number of Stairs: 12 General stair comments: pt required minA and increased time. v/c's for step to pattern as pt unable to step up with R LE due to weakness  ADL: ADL Overall ADL's : Needs assistance/impaired Eating/Feeding: NPO Grooming: Minimal assistance, Sitting Upper Body Bathing: Minimal assistance, Sitting Lower Body Bathing: Moderate assistance, Sit to/from stand Upper Body Dressing : Minimal assistance, Sitting Lower Body Dressing: Maximal assistance, Sit to/from stand Toilet Transfer: Moderate assistance, BSC, Stand-pivot Toileting- Clothing Manipulation and Hygiene: Moderate assistance, Sit to/from stand Toileting - Clothing Manipulation Details (indicate cue type and reason): using L hand for pericare Functional mobility during ADLs: Moderate assistance  Cognition: Cognition Overall Cognitive Status: Impaired/Different from baseline Arousal/Alertness: Awake/alert Orientation Level: Oriented X4 Attention: Sustained, Selective Sustained Attention: Impaired Sustained Attention Impairment: Verbal basic, Functional basic Selective Attention: Impaired Selective Attention Impairment: Verbal basic, Functional basic Cognition Arousal/Alertness: Awake/alert Behavior During Therapy: WFL for tasks assessed/performed Overall Cognitive Status: Impaired/Different from baseline Area of Impairment: Attention, Following commands, Safety/judgement, Problem solving Current Attention  Level: Sustained Memory: Decreased short-term memory Following Commands: Follows multi-step commands inconsistently, Follows multi-step commands with increased time Safety/Judgement: Decreased awareness of safety, Decreased awareness of deficits Awareness: Emergent Problem Solving: Slow processing, Difficulty sequencing, Requires verbal cues, Requires tactile cues General Comments: administerred DGI and pt required multiple v/c's for each task on assessment   Blood pressure (!) 156/75, pulse 78, temperature 98.4 F (36.9 C), temperature source Oral, resp. rate 20, height '5\' 2"'  (1.575 m), weight 88.1 kg (194 lb 4.8 oz), SpO2 100 %. Physical Exam  Nursing note and vitals reviewed. Constitutional: She is oriented to person, place, and time. She appears well-developed and well-nourished.  HENT:  Head: Normocephalic and atraumatic.  Mouth/Throat: Oropharynx is clear and moist.  Eyes: Conjunctivae and EOM are normal. Pupils are equal, round, and reactive to light.  Neck: Normal range of motion. Neck supple.  Cardiovascular: Normal rate and regular rhythm.   No murmur heard. Respiratory: Effort normal and breath sounds normal. No stridor. No respiratory distress. She has no wheezes.  GI: Soft. Bowel sounds are normal. She exhibits no distension. There is no tenderness.  Musculoskeletal: She  exhibits no edema or tenderness.  Neurological: She is alert and oriented to person, place, and time. A cranial nerve deficit is present.  Moderate dysarthria with expressive deficits. Right facial weakness.  Speech slurred. Has some motor apraxia as well. Able to follow simple one and two step commands.  Motor  UE 4/5 prox to distal with slightly less strength on left. LE: 4/5 prox to distal. Sensation intact   Skin: Skin is warm and dry.  Psychiatric: She has a normal mood and affect. Her behavior is normal.    Lab Results Last 48 Hours        Results for orders placed or performed during the  hospital encounter of 10/09/16 (from the past 48 hour(s))  CBC     Status: Abnormal   Collection Time: 10/10/16 11:08 AM  Result Value Ref Range   WBC 8.5 4.0 - 10.5 K/uL   RBC 4.16 3.87 - 5.11 MIL/uL   Hemoglobin 11.7 (L) 12.0 - 15.0 g/dL   HCT 36.0 36.0 - 46.0 %   MCV 86.5 78.0 - 100.0 fL   MCH 28.1 26.0 - 34.0 pg   MCHC 32.5 30.0 - 36.0 g/dL   RDW 13.5 11.5 - 15.5 %   Platelets 317 150 - 400 K/uL  Glucose, capillary     Status: Abnormal   Collection Time: 10/10/16 11:10 AM  Result Value Ref Range   Glucose-Capillary 116 (H) 65 - 99 mg/dL  Glucose, capillary     Status: None   Collection Time: 10/10/16  3:32 PM  Result Value Ref Range   Glucose-Capillary 88 65 - 99 mg/dL  Glucose, capillary     Status: None   Collection Time: 10/10/16  7:19 PM  Result Value Ref Range   Glucose-Capillary 85 65 - 99 mg/dL  Glucose, capillary     Status: None   Collection Time: 10/10/16 11:11 PM  Result Value Ref Range   Glucose-Capillary 82 65 - 99 mg/dL   Comment 1 Notify RN    Comment 2 Document in Chart   Glucose, capillary     Status: None   Collection Time: 10/11/16  4:27 AM  Result Value Ref Range   Glucose-Capillary 97 65 - 99 mg/dL   Comment 1 Notify RN    Comment 2 Document in Chart   CBC with Differential/Platelet     Status: Abnormal   Collection Time: 10/11/16  4:37 AM  Result Value Ref Range   WBC 6.5 4.0 - 10.5 K/uL   RBC 4.44 3.87 - 5.11 MIL/uL   Hemoglobin 13.2 12.0 - 15.0 g/dL   HCT 39.5 36.0 - 46.0 %   MCV 89.0 78.0 - 100.0 fL   MCH 29.7 26.0 - 34.0 pg   MCHC 33.4 30.0 - 36.0 g/dL   RDW 13.8 11.5 - 15.5 %   Platelets 113 (L) 150 - 400 K/uL    Comment: REPEATED TO VERIFY DELTA CHECK NOTED SPECIMEN CHECKED FOR CLOTS PLATELET COUNT CONFIRMED BY SMEAR    Neutrophils Relative % 50 %   Neutro Abs 3.3 1.7 - 7.7 K/uL   Lymphocytes Relative 37 %   Lymphs Abs 2.4 0.7 - 4.0 K/uL   Monocytes Relative 8 %   Monocytes  Absolute 0.6 0.1 - 1.0 K/uL   Eosinophils Relative 4 %   Eosinophils Absolute 0.2 0.0 - 0.7 K/uL   Basophils Relative 1 %   Basophils Absolute 0.1 0.0 - 0.1 K/uL  Basic metabolic panel     Status: None  Collection Time: 10/11/16  4:37 AM  Result Value Ref Range   Sodium 140 135 - 145 mmol/L   Potassium 4.7 3.5 - 5.1 mmol/L    Comment: SLIGHT HEMOLYSIS   Chloride 106 101 - 111 mmol/L   CO2 22 22 - 32 mmol/L   Glucose, Bld 89 65 - 99 mg/dL   BUN 12 6 - 20 mg/dL   Creatinine, Ser 0.75 0.44 - 1.00 mg/dL   Calcium 9.0 8.9 - 10.3 mg/dL   GFR calc non Af Amer >60 >60 mL/min   GFR calc Af Amer >60 >60 mL/min    Comment: (NOTE) The eGFR has been calculated using the CKD EPI equation. This calculation has not been validated in all clinical situations. eGFR's persistently <60 mL/min signify possible Chronic Kidney Disease.    Anion gap 12 5 - 15  Glucose, capillary     Status: None   Collection Time: 10/11/16  7:45 AM  Result Value Ref Range   Glucose-Capillary 93 65 - 99 mg/dL   Comment 1 Notify RN    Comment 2 Document in Chart   Glucose, capillary     Status: None   Collection Time: 10/11/16 11:46 AM  Result Value Ref Range   Glucose-Capillary 95 65 - 99 mg/dL   Comment 1 Notify RN    Comment 2 Document in Chart   Glucose, capillary     Status: Abnormal   Collection Time: 10/11/16  4:51 PM  Result Value Ref Range   Glucose-Capillary 120 (H) 65 - 99 mg/dL   Comment 1 Notify RN    Comment 2 Document in Chart   Glucose, capillary     Status: Abnormal   Collection Time: 10/11/16  7:47 PM  Result Value Ref Range   Glucose-Capillary 112 (H) 65 - 99 mg/dL   Comment 1 Notify RN    Comment 2 Document in Chart   Glucose, capillary     Status: Abnormal   Collection Time: 10/12/16 12:11 AM  Result Value Ref Range   Glucose-Capillary 121 (H) 65 - 99 mg/dL   Comment 1 Notify RN    Comment 2 Document in Chart   Glucose, capillary      Status: None   Collection Time: 10/12/16  3:36 AM  Result Value Ref Range   Glucose-Capillary 92 65 - 99 mg/dL   Comment 1 Notify RN    Comment 2 Document in Chart   Glucose, capillary     Status: Abnormal   Collection Time: 10/12/16  8:07 AM  Result Value Ref Range   Glucose-Capillary 120 (H) 65 - 99 mg/dL   Comment 1 Notify RN    Comment 2 Document in Chart       Imaging Results (Last 48 hours)  Mr Brain Wo Contrast  Result Date: 10/10/2016 CLINICAL DATA:  Expressive aphasia.  Left hemiparesis. EXAM: MRI HEAD WITHOUT CONTRAST TECHNIQUE: Multiplanar, multiecho pulse sequences of the brain and surrounding structures were obtained without intravenous contrast. COMPARISON:  Head CT 10/10/2016 and MRI 03/21/2016 FINDINGS: Brain: There is an acute right lateral lenticulostriate territory infarct involving the posterior lentiform nucleus and a portion of the corona radiata corresponding to the infarct described on recent CT. Additionally, there is a 2 cm region of acute to early subacute infarction more superiorly and posteriorly involving right parietal white matter. The small right occipital infarct is chronic though new from 03/2016. There also chronic lacunar infarcts in the basal ganglia bilaterally, left thalamus, deep cerebral white matter bilaterally,  and anterior corpus callosum. Confluent cerebral white matter T2 hyperintensities are compatible with extensive chronic small vessel ischemic disease. Milder chronic small vessel ischemia is again noted in the pons. No intracranial hemorrhage, mass, midline shift, or extra-axial fluid collection is identified. There is moderate cerebral atrophy. Vascular: Major intracranial vascular flow voids are preserved. Skull and upper cervical spine: Unremarkable bone marrow signal. Sinuses/Orbits: Bilateral cataract extraction. Clear paranasal sinuses. Trace left mastoid effusion. Other: None. IMPRESSION: 1. Acute infarcts in the right basal  ganglia and anterior right parietal white matter. 2. Chronic right occipital cortical infarct and extensive chronic small vessel ischemic changes. Electronically Signed   By: Logan Bores M.D.   On: 10/10/2016 14:33   Dg Chest Port 1 View  Result Date: 10/10/2016 CLINICAL DATA:  Cough. EXAM: PORTABLE CHEST 1 VIEW COMPARISON:  11/06/2015 FINDINGS: The heart size and mediastinal contours are within normal limits. Both lungs are clear. The visualized skeletal structures are unremarkable. IMPRESSION: No active disease. Electronically Signed   By: Kerby Moors M.D.   On: 10/10/2016 11:35   Dg Swallowing Func-speech Pathology  Result Date: 10/11/2016 Objective Swallowing Evaluation: Type of Study: MBS-Modified Barium Swallow Study Patient Details Name: Susan Patel MRN: [id number] Date of Birth: 07/27/1940 Today's Date: 10/11/2016 Time: SLP Start Time (ACUTE ONLY): 1315-SLP Stop Time (ACUTE ONLY): 1333 SLP Time Calculation (min) (ACUTE ONLY): 18 min Past Medical History: Past Medical History: Diagnosis Date . Arthritis   'all over" . Asthma  . Chronic pain  . COPD (chronic obstructive pulmonary disease) (HCC)   O2 per [place] at nights  . Depression  . Diabetes mellitus without complication ([place])   borderline- , states she was on med., but MD told her "everything is under control so I threw the bottle away" . Fibromyalgia  . GERD (gastroesophageal reflux disease)  . Hypertension  . Neuromuscular disorder (HCC)   parkinson, neuropathy- both feet & hands. . Stroke [facility])   residual dysarthria Past Surgical History: Past Surgical History: Procedure Laterality Date . ABDOMINAL HYSTERECTOMY   . ANTERIOR CERVICAL DECOMP/DISCECTOMY FUSION N/A 07/10/2015  Procedure: Cervical five-six, Cervical six-seven anterior cervical decompression with fusion plating and bonegraft;  Surgeon: Jovita Gamma, MD;  Location: [place] [facility];  Service: Neurosurgery;  Laterality: N/A; . APPENDECTOMY   . BIOPSY EYE MUSCLE  03/20/2016  biopsy  vessel to right eye due to swelling . EYE SURGERY Bilateral   cataracts removed, /w "cyrstal lenses"  . SHOULDER ARTHROSCOPY Right   x2   RCR- spurs removed  HPI: Susan Mcclane Knightis a 76 y.o.femalewith history of T2DM with neuropathy, HTN, fibromyalgia--chronic pain, COPD--oxygen at nights,right visual deficits with recent CVA (left internal capsule) 03/2016 and resultant mild pharyngeal phase dysphagia (MBS showed swallow delay to pyriform sinus with thin and nectar-thick liquids). Pt with CIR, OP therapy d/c from OP 05/31/16 on regular diet with thin liquids. Admitted 10/09/16, CT shows suspected acute right basal ganglia infarct, new left basal ganglia lacunar infarct, possibly subacute right occipital infarct. Subjective: alert, significantly dysarthric Assessment / Plan / Recommendation CHL IP CLINICAL IMPRESSIONS 10/11/2016 Clinical Impression Pt has a moderate, primarily oral dysphagia characterized by oral holding, lingual pumping, piecemeal swallows, and decreased bolus cohesion. Mod cues were provided throughout study for pt to initiate oral transit. She is not able to obtain liquids via straw and has mild-moderate anterior loss with cup sips. Nectar thick liquids are aspirated as they spill prematurely into her pharynx/larynx. She triggers a consistent cough response, which is protective enough to  clear most of her aspirates but not all of them. Recommend Dys 1 diet and honey thick liquids by cup with meds crushed in puree. Suspect that consuming an entire meal tray may be a laborous task for her, so she will likely benefit from smaller, more frequent intake and rest breaks. SLP Visit Diagnosis Dysphagia, oral phase (R13.11) Attention and concentration deficit following -- Frontal lobe and executive function deficit following -- Impact on safety and function Moderate aspiration risk   CHL IP TREATMENT RECOMMENDATION 10/11/2016 Treatment Recommendations Therapy as outlined in treatment plan below   Prognosis  10/11/2016 Prognosis for Safe Diet Advancement Good Barriers to Reach Goals Severity of deficits Barriers/Prognosis Comment -- CHL IP DIET RECOMMENDATION 10/11/2016 SLP Diet Recommendations Dysphagia 1 (Puree) solids;Honey thick liquids Liquid Administration via Cup;No straw Medication Administration Whole meds with puree Compensations Slow rate;Small sips/bites;Follow solids with liquid Postural Changes Remain semi-upright after after feeds/meals (Comment);Seated upright at 90 degrees   CHL IP OTHER RECOMMENDATIONS 10/11/2016 Recommended Consults -- Oral Care Recommendations Oral care BID Other Recommendations Order thickener from pharmacy;Prohibited food (jello, ice cream, thin soups);Remove water pitcher;Have oral suction available   CHL IP FOLLOW UP RECOMMENDATIONS 10/11/2016 Follow up Recommendations Inpatient Rehab   CHL IP FREQUENCY AND DURATION 10/11/2016 Speech Therapy Frequency (ACUTE ONLY) min 2x/week Treatment Duration 2 weeks      CHL IP ORAL PHASE 10/11/2016 Oral Phase Impaired Oral - Pudding Teaspoon -- Oral - Pudding Cup -- Oral - Honey Teaspoon -- Oral - Honey Cup Holding of bolus;Piecemeal swallowing;Lingual pumping;Decreased bolus cohesion;Premature spillage;Delayed oral transit;Lingual/palatal residue Oral - Nectar Teaspoon -- Oral - Nectar Cup Holding of bolus;Piecemeal swallowing;Lingual pumping;Decreased bolus cohesion;Premature spillage;Delayed oral transit;Lingual/palatal residue Oral - Nectar Straw -- Oral - Thin Teaspoon -- Oral - Thin Cup -- Oral - Thin Straw -- Oral - Puree Holding of bolus;Piecemeal swallowing;Lingual pumping;Decreased bolus cohesion;Premature spillage;Delayed oral transit;Lingual/palatal residue Oral - Mech Soft -- Oral - Regular -- Oral - Multi-Consistency -- Oral - Pill -- Oral Phase - Comment --  CHL IP PHARYNGEAL PHASE 10/11/2016 Pharyngeal Phase Impaired Pharyngeal- Pudding Teaspoon -- Pharyngeal -- Pharyngeal- Pudding Cup -- Pharyngeal -- Pharyngeal- Honey Teaspoon --  Pharyngeal -- Pharyngeal- Honey Cup WFL Pharyngeal -- Pharyngeal- Nectar Teaspoon -- Pharyngeal -- Pharyngeal- Nectar Cup Penetration/Aspiration before swallow Pharyngeal Material enters airway, passes BELOW cords and not ejected out despite cough attempt by patient Pharyngeal- Nectar Straw -- Pharyngeal -- Pharyngeal- Thin Teaspoon -- Pharyngeal -- Pharyngeal- Thin Cup -- Pharyngeal -- Pharyngeal- Thin Straw -- Pharyngeal -- Pharyngeal- Puree WFL Pharyngeal -- Pharyngeal- Mechanical Soft -- Pharyngeal -- Pharyngeal- Regular -- Pharyngeal -- Pharyngeal- Multi-consistency -- Pharyngeal -- Pharyngeal- Pill -- Pharyngeal -- Pharyngeal Comment --  CHL IP CERVICAL ESOPHAGEAL PHASE 10/11/2016 Cervical Esophageal Phase WFL Pudding Teaspoon -- Pudding Cup -- Honey Teaspoon -- Honey Cup -- Nectar Teaspoon -- Nectar Cup -- Nectar Straw -- Thin Teaspoon -- Thin Cup -- Thin Straw -- Puree -- Mechanical Soft -- Regular -- Multi-consistency -- Pill -- Cervical Esophageal Comment -- CHL IP GO 05/31/2016 Functional Assessment Tool Used skilled observation  Functional Limitations Spoken language expressive Swallow Current Status (G6659) (None) Swallow Goal Status (D3570) (None) Swallow Discharge Status (V7793) (None) Motor Speech Current Status (J0300) (None) Motor Speech Goal Status (P2330) (None) Motor Speech Goal Status (Q7622) (None) Spoken Language Comprehension Current Status (Q3335) (None) Spoken Language Comprehension Goal Status (K5625) (None) Spoken Language Comprehension Discharge Status (W3893) (None) Spoken Language Expression Current Status (T3428) (None) Spoken Language Expression Goal Status (J6811)  [place] Spoken Language Expression Discharge Status [phone]) CI Attention Current Status [phone]) (None) Attention Goal Status [phone]) (None) Attention Discharge Status [phone]) (None) Memory Current Status (F7902) (None) Memory Goal Status (I0973) (None) Memory Discharge Status (Z3299) (None) Voice Current Status (M4268) (None)  Voice Goal Status (T4196) (None) Voice Discharge Status (Q2297) (None) Other Speech-Language Pathology Functional Limitation Current Status (L8921) (None) Other Speech-Language Pathology Functional Limitation Goal Status (J9417) (None) Other Speech-Language Pathology Functional Limitation Discharge Status [phone]) (None) Germain Osgood 10/11/2016, 2:21 PM  Germain Osgood, M.A. CCC-SLP [phone]                  Medical Problem List and Plan: 1.  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  DVT Prophylaxis/Anticoagulation: Pharmaceutical: Lovenox 3. Chronic pain/Pain Management: continue tramadol prn. Resume Neurontin as indicating increase pain in hands/feet.  4. Mood: LCSW to follow for evaluation and support.  5. Neuropsych: This patient is not fully capable of making decisions on her own behalf. 6. Skin/Wound Care: routine pressure relief measures.  7. Fluids/Electrolytes/Nutrition: Monitor I/O. Check lytes in am. n adequate hydration-- change to HS. Encourage fluid intake during the day.  Will continue to monitor for now.  8. T2DM: Hgb A1c- 6.3. Resume amaryl and monitor BS ac/hs. Use SSI for elevated BS and titrated meds as needed for tighter control.  9. COPD: Uses 3 L oxygen at nights.  10. Insomnia: continue trazodone prn.  11. Constipation: Resume linzess.  12. Dyslipidemia : On Crestor.  13. COPD: Resume Breo. Continue 3 L oxygen at nights.  14. HTN: Monitor BP bid with permissive HTN for few more days. Continue to hold losartan/HCTZ 15. Dysphagia: On dysphagia 1, honey liquids by tsp. Add IVF at nights to help maintain adequate hydration.    Post Admission Physician Evaluation: 1. Functional deficits secondary  to right basal ganglia and parietal infarcts. 2. Patient is admitted to receive collaborative, interdisciplinary care between the physiatrist, rehab nursing staff, and  therapy team. 3. Patient's level of medical complexity and substantial therapy needs in context of that medical necessity cannot be provided at a lesser intensity of care such as a SNF. 4. Patient has experienced substantial functional loss from his/her baseline which was documented above under the "Functional History" and "Functional Status" headings.  Judging by the patient's diagnosis, physical exam, and functional history, the patient has potential for functional progress which will result in measurable gains while on inpatient rehab.  These gains will be of substantial and practical use upon discharge  in facilitating mobility and self-care at the household level. 5. Physiatrist will provide 24 hour management of medical needs as well as oversight of the therapy plan/treatment and provide guidance as appropriate regarding the interaction of the two. 6. The Preadmission Screening has been reviewed and patient status is unchanged unless otherwise stated above. 7. 24 hour rehab nursing will assist with bladder management, bowel management, safety, skin/wound care, disease management, medication administration, pain management and patient education  and help integrate therapy concepts, techniques,education, etc. 8. PT will assess and treat for/with: Lower extremity strength, range of motion, stamina, balance, functional mobility, safety, adaptive techniques and equipment, NMR, family education.   Goals are: mod I to supervision. 9. OT will assess and treat for/with: ADL's, functional mobility, safety, upper extremity strength, adaptive techniques and equipment, NMR, family education, community reintegration.   Goals are: mod I  to supervision. Therapy may proceed with showering this patient. 10. SLP will assess and treat for/with: cognition, communication, swallowing, family education.  Goals are: mod I to supervision. 11. Case Management and Social Worker will assess and treat for psychological issues and  discharge planning. 12. Team conference will be held weekly to assess progress toward goals and to determine barriers to discharge. 13. Patient will receive at least 3 hours of therapy per day at least 5 days per week. 14. ELOS: 7 days       15. Prognosis:  excellent     Meredith Staggers, MD, [facility] 10/12/2016  Bary Leriche, Hershal Coria 10/12/2016

## 2016-10-12 NOTE — Progress Notes (Signed)
Meredith Staggers, MD Physician Signed Physical Medicine and Rehabilitation  Consult Note Date of Service: 10/11/2016 10:31 AM  Related encounter: ED to Hosp-Admission (Current) from 10/09/2016 in [place] All Collapse All   [] Hide copied text [] Hover for attribution information      Physical Medicine and Rehabilitation Consult   Reason for Consult:  Stroke with left sided weakness, severe dysarthria, expressive aphasia and dysphagia.  Referring Physician: Dr. Leonie Man.    HPI: Susan Patel is a 76 y.o. female COPD- oxygen dependent, T2DM, Neuropathy, chronic pain, CVA with residual visual deficits?, Parkinsonism; who was admitted on 10/09/16 with inability to speak, left sided weakness and difficulty walking. CT head done revealing suspicion of acute right basal infarct, subacute right occipital infarct, left basal infarct question age and extensive small vessel disease. She receive tPA and continues on ASA for secondary stroke prevention.  MRI brain done revealing acute infracts right basal ganglia nd anteiro right parietal white matter with chronic right occipital cortical infarct. CTA head/neck showed severe progressive advanced intracranial atherosclerosis.  2 D echo revealed EF 60-65%, with grade one diastolic dysfunction, severe aspiration risk--NPO. She reported severe HA yesterday and repeat CT head without acute changes.  Patient with sever dysarthria, severe expressive deficits, poor safety as well as decrease in mobility and ability to carry out ADL tasks. CIR recommended by rehab team.    Review of Systems  Constitutional: Negative for fever.  HENT: Negative for hearing loss.   Eyes: Negative for blurred vision.  Respiratory: Negative for cough.   Cardiovascular: Negative for chest pain.  Gastrointestinal: Negative for heartburn.  Genitourinary: Negative for dysuria.  Musculoskeletal: Negative for myalgias.    Skin: Negative for rash.  Neurological: Negative for dizziness.  Psychiatric/Behavioral: Negative for depression.          Past Medical History:  Diagnosis Date  . Arthritis    'all over"  . Asthma   . Chronic pain   . COPD (chronic obstructive pulmonary disease) (HCC)    O2 per [place] at nights   . Depression   . Diabetes mellitus without complication ([place])    borderline- , states she was on med., but MD told her "everything is under control so I threw the bottle away"  . Fibromyalgia   . GERD (gastroesophageal reflux disease)   . Hypertension   . Neuromuscular disorder (HCC)    parkinson, neuropathy- both feet & hands.  . Stroke [facility])    residual dysarthria         Past Surgical History:  Procedure Laterality Date  . ABDOMINAL HYSTERECTOMY    . ANTERIOR CERVICAL DECOMP/DISCECTOMY FUSION N/A 07/10/2015   Procedure: Cervical five-six, Cervical six-seven anterior cervical decompression with fusion plating and bonegraft;  Surgeon: Jovita Gamma, MD;  Location: [facility];  Service: Neurosurgery;  Laterality: N/A;  . APPENDECTOMY    . BIOPSY EYE MUSCLE  03/20/2016   biopsy vessel to right eye due to swelling  . EYE SURGERY Bilateral    cataracts removed, /w "cyrstal lenses"   . SHOULDER ARTHROSCOPY Right    x2   RCR- spurs removed          Family History  Problem Relation Age of Onset  . Heart disease Mother   . Cancer Father   . Heart disease Father     Social History:  reports that she has never smoked. She has never used smokeless tobacco. She reports  that she does not drink alcohol or use drugs.         Allergies  Allergen Reactions  . Azithromycin Other (See Comments)    Renal failure  . Prednisone Other (See Comments)    Irritability and insomnia  . Sulfamethoxazole-Trimethoprim Other (See Comments)    Renal failure  . Amlodipine     Caused swelling in legs  . Ciprofloxacin Itching and Rash  . Tape  Rash          Medications Prior to Admission  Medication Sig Dispense Refill  . aspirin 325 MG EC tablet Take 1 tablet (325 mg total) by mouth daily. 90 tablet 1  . cetirizine (ZYRTEC) 10 MG tablet Take 10 mg by mouth daily.    . DULoxetine (CYMBALTA) 30 MG capsule Take 2 capsules (60 mg total) by mouth daily. (Patient taking differently: Take 60 mg by mouth 2 (two) times daily. ) 60 capsule 3  . esomeprazole (NEXIUM) 20 MG capsule Take 20 mg by mouth daily.    . fluticasone (FLONASE) 50 MCG/ACT nasal spray Place 2 sprays into both nostrils daily. (Patient taking differently: Place 2 sprays into both nostrils daily as needed for allergies. ) 54 g 0  . fluticasone furoate-vilanterol (BREO ELLIPTA) 200-25 MCG/INH AEPB Inhale 1 puff into the lungs daily.    Marland Kitchen gabapentin (NEURONTIN) 300 MG capsule Take 1 capsule (300 mg total) by mouth at bedtime. 30 capsule 3  . glimepiride (AMARYL) 2 MG tablet TAKE 1 TABLET (2 MG TOTAL) BY MOUTH DAILY BEFORE BREAKFAST. 90 tablet 0  . latanoprost (XALATAN) 0.005 % ophthalmic solution Place 1 drop into both eyes at bedtime. 2.5 mL 12  . linaclotide (LINZESS) 72 MCG capsule Take 1 capsule (72 mcg total) by mouth daily before breakfast. 90 capsule 0  . losartan-hydrochlorothiazide (HYZAAR) 100-25 MG tablet Take 1 tablet by mouth daily. 90 tablet 3  . montelukast (SINGULAIR) 10 MG tablet TAKE 1 TABLET (10 MG TOTAL) BY MOUTH AT BEDTIME. 30 tablet 9  . OXYGEN Inhale 3-4 L into the lungs at bedtime.    . rosuvastatin (CRESTOR) 40 MG tablet Take 1 tablet (40 mg total) by mouth every evening. 90 tablet 0  . traMADol (ULTRAM) 50 MG tablet Take 1 tablet (50 mg total) by mouth every 8 (eight) hours as needed for moderate pain or severe pain. (Patient taking differently: Take 50 mg by mouth 2 (two) times daily. ) 90 tablet 2  . traZODone (DESYREL) 50 MG tablet TAKE 1 TABLET (50 MG TOTAL) BY MOUTH NIGHTLY AS NEEDED FOR FOR SLEEP. 30 tablet 0  . Vitamin D,  Ergocalciferol, (DRISDOL) 50000 units CAPS capsule Take 50,000 Units by mouth every Monday.   3  . acetaminophen (TYLENOL) 500 MG tablet Take 1,000 mg by mouth every 8 (eight) hours as needed for mild pain or moderate pain.    Marland Kitchen albuterol (PROVENTIL HFA;VENTOLIN HFA) 108 (90 Base) MCG/ACT inhaler Inhale 1-2 puffs into the lungs every 6 (six) hours as needed for wheezing or shortness of breath. 1 Inhaler 1  . triamcinolone (KENALOG) 0.025 % ointment Apply 1 application topically 2 (two) times daily. (Patient taking differently: Apply 1 application topically 2 (two) times daily as needed. ) 30 g 1    Home: Home Living Family/patient expects to be discharged to:: Private residence Living Arrangements: Spouse/significant other Available Help at Discharge: Available 24 hours/day Type of Home: House Home Access: Ramped entrance, Stairs to enter [employer] of Steps: 4 Entrance Stairs-Rails: Right Home  Layout: One level, Able to live on main level with bedroom/bathroom (has chair lift for upstairs) Bathroom Shower/Tub: Other (comment) (safe step walk in tub) Bathroom Toilet: Standard Bathroom Accessibility: Yes Home Equipment: Shower seat - built in, [employer] - tub/shower, [employer] - single point, Bedside commode  Functional History: Prior Function Level of Independence: Independent Functional Status:  Mobility: Bed Mobility Overal bed mobility: Needs Assistance Bed Mobility: Supine to Sit Supine to sit: Mod assist General bed mobility comments: Pt required vc to problem solve how to roll to side before pushing up then mod A to help transition trunk from sidelying to sit Transfers Overall transfer level: Needs assistance Transfers: Sit to/from Stand, Stand Pivot Transfers Sit to Stand: Min assist Stand pivot transfers: Mod assist  ADL: ADL Overall ADL's : Needs assistance/impaired Eating/Feeding: NPO Grooming: Minimal assistance, Sitting Upper Body Bathing: Minimal  assistance, Sitting Lower Body Bathing: Moderate assistance, Sit to/from stand Upper Body Dressing : Minimal assistance, Sitting Lower Body Dressing: Maximal assistance, Sit to/from stand Toilet Transfer: Moderate assistance, BSC, Stand-pivot Toileting- Clothing Manipulation and Hygiene: Moderate assistance, Sit to/from stand Toileting - Clothing Manipulation Details (indicate cue type and reason): using L hand for pericare Functional mobility during ADLs: Moderate assistance  Cognition: Cognition Overall Cognitive Status: Impaired/Different from baseline Arousal/Alertness: Awake/alert Orientation Level: Oriented X4 Attention: Sustained, Selective Sustained Attention: Impaired Sustained Attention Impairment: Verbal basic, Functional basic Selective Attention: Impaired Selective Attention Impairment: Verbal basic, Functional basic Cognition Arousal/Alertness: Awake/alert Behavior During Therapy: Flat affect Overall Cognitive Status: Impaired/Different from baseline Area of Impairment: Attention, Memory, Safety/judgement, Awareness, Problem solving Current Attention Level: Sustained Memory: Decreased short-term memory Safety/Judgement: Decreased awareness of safety, Decreased awareness of deficits Awareness: Emergent Problem Solving: Slow processing General Comments: will further assess   Blood pressure (!) 168/77, pulse 74, temperature 98.5 F (36.9 C), temperature source Oral, resp. rate 17, height 5\' 2"  (1.575 m), weight 88.1 kg (194 lb 4.8 oz), SpO2 100 %. Physical Exam  Constitutional: She appears well-developed.  HENT:  Head: Normocephalic.  Eyes: EOM are normal.  Neck: No thyromegaly present.  Cardiovascular: Normal rate.   Respiratory: Effort normal.  GI: Soft.  Neurological:  Pt alert. Right facial weakness. Decreased cough. Speech slurred. Has some motor apraxia as well. UE 4/5 prox to distal with slightly less strength on left. LE: 4/5 prox to distal. Sensation  intact  Psychiatric: She has a normal mood and affect. Her behavior is normal.    Lab Results Last 24 Hours       Results for orders placed or performed during the hospital encounter of 10/09/16 (from the past 24 hour(s))  CBC     Status: Abnormal   Collection Time: 10/10/16 11:08 AM  Result Value Ref Range   WBC 8.5 4.0 - 10.5 K/uL   RBC 4.16 3.87 - 5.11 MIL/uL   Hemoglobin 11.7 (L) 12.0 - 15.0 g/dL   HCT 36.0 36.0 - 46.0 %   MCV 86.5 78.0 - 100.0 fL   MCH 28.1 26.0 - 34.0 pg   MCHC 32.5 30.0 - 36.0 g/dL   RDW 13.5 11.5 - 15.5 %   Platelets 317 150 - 400 K/uL  Glucose, capillary     Status: Abnormal   Collection Time: 10/10/16 11:10 AM  Result Value Ref Range   Glucose-Capillary 116 (H) 65 - 99 mg/dL  Glucose, capillary     Status: None   Collection Time: 10/10/16  3:32 PM  Result Value Ref Range   Glucose-Capillary 88 65 -  99 mg/dL  Glucose, capillary     Status: None   Collection Time: 10/10/16  7:19 PM  Result Value Ref Range   Glucose-Capillary 85 65 - 99 mg/dL  Glucose, capillary     Status: None   Collection Time: 10/10/16 11:11 PM  Result Value Ref Range   Glucose-Capillary 82 65 - 99 mg/dL   Comment 1 Notify RN    Comment 2 Document in Chart   Glucose, capillary     Status: None   Collection Time: 10/11/16  4:27 AM  Result Value Ref Range   Glucose-Capillary 97 65 - 99 mg/dL   Comment 1 Notify RN    Comment 2 Document in Chart   CBC with Differential/Platelet     Status: Abnormal   Collection Time: 10/11/16  4:37 AM  Result Value Ref Range   WBC 6.5 4.0 - 10.5 K/uL   RBC 4.44 3.87 - 5.11 MIL/uL   Hemoglobin 13.2 12.0 - 15.0 g/dL   HCT 39.5 36.0 - 46.0 %   MCV 89.0 78.0 - 100.0 fL   MCH 29.7 26.0 - 34.0 pg   MCHC 33.4 30.0 - 36.0 g/dL   RDW 13.8 11.5 - 15.5 %   Platelets 113 (L) 150 - 400 K/uL   Neutrophils Relative % 50 %   Neutro Abs 3.3 1.7 - 7.7 K/uL   Lymphocytes Relative 37 %   Lymphs Abs 2.4 0.7 - 4.0  K/uL   Monocytes Relative 8 %   Monocytes Absolute 0.6 0.1 - 1.0 K/uL   Eosinophils Relative 4 %   Eosinophils Absolute 0.2 0.0 - 0.7 K/uL   Basophils Relative 1 %   Basophils Absolute 0.1 0.0 - 0.1 K/uL  Basic metabolic panel     Status: None   Collection Time: 10/11/16  4:37 AM  Result Value Ref Range   Sodium 140 135 - 145 mmol/L   Potassium 4.7 3.5 - 5.1 mmol/L   Chloride 106 101 - 111 mmol/L   CO2 22 22 - 32 mmol/L   Glucose, Bld 89 65 - 99 mg/dL   BUN 12 6 - 20 mg/dL   Creatinine, Ser 0.75 0.44 - 1.00 mg/dL   Calcium 9.0 8.9 - 10.3 mg/dL   GFR calc non Af Amer >60 >60 mL/min   GFR calc Af Amer >60 >60 mL/min   Anion gap 12 5 - 15  Glucose, capillary     Status: None   Collection Time: 10/11/16  7:45 AM  Result Value Ref Range   Glucose-Capillary 93 65 - 99 mg/dL   Comment 1 Notify RN    Comment 2 Document in Chart       Imaging Results (Last 48 hours)  Ct Angio Head W/cm &/or Wo Cm  Result Date: 10/09/2016 CLINICAL DATA:  Aphasia.  Facial droop. EXAM: CT ANGIOGRAPHY HEAD AND NECK TECHNIQUE: Multidetector CT imaging of the head and neck was performed using the standard protocol during bolus administration of intravenous contrast. Multiplanar CT image reconstructions and MIPs were obtained to evaluate the vascular anatomy. Carotid stenosis measurements (when applicable) are obtained utilizing NASCET criteria, using the distal internal carotid diameter as the denominator. CONTRAST:  80 mL Isovue 370 COMPARISON:  Noncontrast head CT earlier today. Head MRI/ MRA 03/21/2016. FINDINGS: CTA NECK FINDINGS Aortic arch: Standard 3 vessel aortic arch. Widely patent brachiocephalic and subclavian arteries. Right carotid system: Patent without evidence of stenosis or dissection. Left carotid system: Patent without evidence of stenosis or dissection. Mild calcified and  soft plaque in the proximal ICA. Vertebral arteries: Patent and codominant without definite  stenosis or dissection, although refluxed venous contrast limits assessment of the right V1 segment. Skeleton: C5-C7 ACDF.  Moderate cervical facet arthrosis. Other neck: No mass or lymph node enlargement. Upper chest: Mild motion artifact and atelectasis in the visualized lungs. Review of the MIP images confirms the above findings CTA HEAD FINDINGS Anterior circulation: The internal carotid arteries are patent from skullbase to carotid termini. There are progressive, now severe stenoses of both supraclinoid segments. The M1 segments are patent without significant proximal stenosis, however there are severe stenoses involving both MCA bifurcations and multiple M2 and more distal branches. ACAs are patent without significant A1 stenosis, although there are severe A2 and more distal branch vessel stenoses. No aneurysm. Posterior circulation: The intracranial vertebral arteries are patent to the basilar. V4 segment atherosclerosis results in mild distal narrowing on the right but progressive, severe stenosis on the left. There is new mild stenosis of the midbasilar artery. There are severe tandem stenoses involving the P1 and P2 segments bilaterally including a persistent high-grade stenosis or segmental occlusion of the mid right P2 segment with decreased number of distal right PCA branch vessels. No aneurysm. Venous sinuses: Patent. Anatomic variants: None. Delayed phase: No abnormal enhancement. Review of the MIP images confirms the above findings IMPRESSION: 1. No emergent large vessel occlusion. 2. Progressive, advanced intracranial atherosclerosis including severe stenoses involving both supraclinoid ICAs, MCA bifurcation regions, distal left vertebral artery, proximal PCAs, and numerous anterior and posterior circulation branch vessels. Right PCA branch vessel occlusion corresponding to the previously described infarct. 3. No cervical carotid artery stenosis. These results were called by telephone at the time of  interpretation on 10/09/2016 at 2:30 pm to Dr. Leonel Ramsay, who verbally acknowledged these results. Electronically Signed   By: Logan Bores M.D.   On: 10/09/2016 14:51   Ct Head Wo Contrast  Result Date: 10/10/2016 CLINICAL DATA:  Acute onset headache after tPA. History of hypertension, diabetes. EXAM: CT HEAD WITHOUT CONTRAST TECHNIQUE: Contiguous axial images were obtained from the base of the skull through the vertex without intravenous contrast. COMPARISON:  CT HEAD Oct 09, 2016 FINDINGS: BRAIN: No intraparenchymal hemorrhage, mass effect nor midline shift. No acute large vascular territory infarct. Bilateral basal ganglia and thalamus lacunar infarcts. RIGHT mesial occipital lobe encephalomalacia. Confluent supratentorial white matter hypodensities. No abnormal extra-axial fluid collections. Basal cisterns are patent. VASCULAR: Moderate calcific atherosclerosis of the carotid siphons. SKULL: No skull fracture. No significant scalp soft tissue swelling. SINUSES/ORBITS: Trace LEFT mastoid effusion. Paranasal sinus are well aerated. The included ocular globes and orbital contents are non-suspicious. Status post bilateral ocular lens implants. OTHER: None. IMPRESSION: No intracranial hemorrhage or acute intracranial process. Stable age indeterminate basal ganglia and thalamus lacunar infarcts. Subacute to old RIGHT occipital lobe/ PCA territory infarct. Severe chronic small vessel ischemic disease. Electronically Signed   By: Elon Alas M.D.   On: 10/10/2016 01:30   Ct Angio Neck W And/or Wo Contrast  Result Date: 10/09/2016 CLINICAL DATA:  Aphasia.  Facial droop. EXAM: CT ANGIOGRAPHY HEAD AND NECK TECHNIQUE: Multidetector CT imaging of the head and neck was performed using the standard protocol during bolus administration of intravenous contrast. Multiplanar CT image reconstructions and MIPs were obtained to evaluate the vascular anatomy. Carotid stenosis measurements (when applicable) are  obtained utilizing NASCET criteria, using the distal internal carotid diameter as the denominator. CONTRAST:  80 mL Isovue 370 COMPARISON:  Noncontrast head CT earlier today.  Head MRI/ MRA 03/21/2016. FINDINGS: CTA NECK FINDINGS Aortic arch: Standard 3 vessel aortic arch. Widely patent brachiocephalic and subclavian arteries. Right carotid system: Patent without evidence of stenosis or dissection. Left carotid system: Patent without evidence of stenosis or dissection. Mild calcified and soft plaque in the proximal ICA. Vertebral arteries: Patent and codominant without definite stenosis or dissection, although refluxed venous contrast limits assessment of the right V1 segment. Skeleton: C5-C7 ACDF.  Moderate cervical facet arthrosis. Other neck: No mass or lymph node enlargement. Upper chest: Mild motion artifact and atelectasis in the visualized lungs. Review of the MIP images confirms the above findings CTA HEAD FINDINGS Anterior circulation: The internal carotid arteries are patent from skullbase to carotid termini. There are progressive, now severe stenoses of both supraclinoid segments. The M1 segments are patent without significant proximal stenosis, however there are severe stenoses involving both MCA bifurcations and multiple M2 and more distal branches. ACAs are patent without significant A1 stenosis, although there are severe A2 and more distal branch vessel stenoses. No aneurysm. Posterior circulation: The intracranial vertebral arteries are patent to the basilar. V4 segment atherosclerosis results in mild distal narrowing on the right but progressive, severe stenosis on the left. There is new mild stenosis of the midbasilar artery. There are severe tandem stenoses involving the P1 and P2 segments bilaterally including a persistent high-grade stenosis or segmental occlusion of the mid right P2 segment with decreased number of distal right PCA branch vessels. No aneurysm. Venous sinuses: Patent. Anatomic  variants: None. Delayed phase: No abnormal enhancement. Review of the MIP images confirms the above findings IMPRESSION: 1. No emergent large vessel occlusion. 2. Progressive, advanced intracranial atherosclerosis including severe stenoses involving both supraclinoid ICAs, MCA bifurcation regions, distal left vertebral artery, proximal PCAs, and numerous anterior and posterior circulation branch vessels. Right PCA branch vessel occlusion corresponding to the previously described infarct. 3. No cervical carotid artery stenosis. These results were called by telephone at the time of interpretation on 10/09/2016 at 2:30 pm to Dr. Leonel Ramsay, who verbally acknowledged these results. Electronically Signed   By: Logan Bores M.D.   On: 10/09/2016 14:51   Mr Brain Wo Contrast  Result Date: 10/10/2016 CLINICAL DATA:  Expressive aphasia.  Left hemiparesis. EXAM: MRI HEAD WITHOUT CONTRAST TECHNIQUE: Multiplanar, multiecho pulse sequences of the brain and surrounding structures were obtained without intravenous contrast. COMPARISON:  Head CT 10/10/2016 and MRI 03/21/2016 FINDINGS: Brain: There is an acute right lateral lenticulostriate territory infarct involving the posterior lentiform nucleus and a portion of the corona radiata corresponding to the infarct described on recent CT. Additionally, there is a 2 cm region of acute to early subacute infarction more superiorly and posteriorly involving right parietal white matter. The small right occipital infarct is chronic though new from 03/2016. There also chronic lacunar infarcts in the basal ganglia bilaterally, left thalamus, deep cerebral white matter bilaterally, and anterior corpus callosum. Confluent cerebral white matter T2 hyperintensities are compatible with extensive chronic small vessel ischemic disease. Milder chronic small vessel ischemia is again noted in the pons. No intracranial hemorrhage, mass, midline shift, or extra-axial fluid collection is identified.  There is moderate cerebral atrophy. Vascular: Major intracranial vascular flow voids are preserved. Skull and upper cervical spine: Unremarkable bone marrow signal. Sinuses/Orbits: Bilateral cataract extraction. Clear paranasal sinuses. Trace left mastoid effusion. Other: None. IMPRESSION: 1. Acute infarcts in the right basal ganglia and anterior right parietal white matter. 2. Chronic right occipital cortical infarct and extensive chronic small vessel ischemic changes. Electronically  Signed   By: Logan Bores M.D.   On: 10/10/2016 14:33   Dg Chest Port 1 View  Result Date: 10/10/2016 CLINICAL DATA:  Cough. EXAM: PORTABLE CHEST 1 VIEW COMPARISON:  11/06/2015 FINDINGS: The heart size and mediastinal contours are within normal limits. Both lungs are clear. The visualized skeletal structures are unremarkable. IMPRESSION: No active disease. Electronically Signed   By: Kerby Moors M.D.   On: 10/10/2016 11:35   Ct Head Code Stroke W/o Cm  Result Date: 10/09/2016 CLINICAL DATA:  Code stroke. Aphasia. Possible left facial droop and left lower extremity weakness. Right lower extremity weakness as well which may be chronic. EXAM: CT HEAD WITHOUT CONTRAST TECHNIQUE: Contiguous axial images were obtained from the base of the skull through the vertex without intravenous contrast. COMPARISON:  Head CT 03/20/2016 and MRI 03/21/2016 FINDINGS: Brain: A chronic infarct in the left internal capsule was acute on the prior MRI. Chronic lacunar infarcts are again seen in the right lentiform nucleus, periventricular white matter, corpus callosum, and left thalamus. There is a new small right occipital lobe infarct which may be subacute. There is also a new 1 cm infarct in the right lentiform nucleus which may be acute or subacute. A new 5 mm infarct in the left lentiform nucleus is also of indeterminate age. No acute intracranial hemorrhage, mass, midline shift, or extra-axial fluid collection is identified. Cerebral  atrophy is unchanged. Confluent cerebral white matter hypodensities are compatible with extensive chronic small vessel ischemia. Vascular: Calcified atherosclerosis at the skullbase. No hyperdense vessel. Skull: No fracture or focal osseous lesion. Sinuses/Orbits: Visualized paranasal sinuses and mastoid air cells are clear. Bilateral cataract extraction. Other: None. ASPECTS [facility] Stroke Program Early CT Score) Not scored due to extensive chronic ischemic changes and difficulty with symptom localization on physical examination. IMPRESSION: 1. Suspected acute right basal ganglia infarct. 2. New left basal ganglia lacunar infarct, of indeterminate age. 3. New right occipital lobe infarct, possibly subacute. 4. Extensive chronic small vessel ischemic disease. 5. No acute intracranial hemorrhage. These results were called by telephone at the time of interpretation on 10/09/2016 at 12:57 pm to Dr. Francine Graven , who verbally acknowledged these results. Electronically Signed   By: Logan Bores M.D.   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 Does the need for close, 24 hr/day medical supervision in concert with the patient's rehab needs make it unreasonable for this patient to be served in a less intensive setting? Yes 2. Co-Morbidities requiring supervision/potential complications: dysphagia, dm2, obesity, fibromyalgia 3. Due to bladder management, bowel management, safety, skin/wound care, disease management, medication administration, pain management and patient education, does the patient require 24 hr/day rehab nursing? Yes 4. Does the patient require coordinated care of a physician, rehab nurse, PT (1-2 hrs/day, 5 days/week), OT (1-2 hrs/day, 5 days/week) and SLP (1-2 hrs/day, 5 days/week) to address physical and functional deficits in the context of the above medical diagnosis(es)?  Yes Addressing deficits in the following areas: balance, endurance, locomotion, strength, transferring, bowel/bladder control, bathing, dressing, feeding, grooming, toileting, cognition, speech, swallowing and psychosocial support 5. Can the patient actively participate in an intensive therapy program of at least 3 hrs of therapy per day at least 5 days per week? Yes 6. The potential for patient to make measurable gains while on inpatient rehab is excellent 7. Anticipated functional outcomes upon discharge from inpatient rehab are modified  independent  with PT, modified independent and supervision with OT, modified independent, supervision and min assist with SLP. 8. Estimated rehab length of stay to reach the above functional goals is: 7 days 9. Anticipated D/C setting: Home 10. Anticipated post D/C treatments: HH therapy and Outpatient therapy 11. Overall Rehab/Functional Prognosis: excellent  RECOMMENDATIONS: This patient's condition is appropriate for continued rehabilitative care in the following setting: CIR Patient has agreed to participate in recommended program. Yes Note that insurance prior authorization may be required for reimbursement for recommended care.  Comment: Rehab Admissions Coordinator to follow up.  Thanks,  Meredith Staggers, MD, [place], [place] 10/11/2016    Revision History                        Routing History

## 2016-10-12 NOTE — Progress Notes (Signed)
Cristina Gong, RN Rehab Admission Coordinator Signed Physical Medicine and Rehabilitation  PMR Pre-admission Date of Service: 10/11/2016 4:15 PM  Related encounter: ED to Hosp-Admission (Current) from 10/09/2016 in [place]       [] Hide copied text PMR Admission Coordinator Pre-Admission Assessment  Patient: Susan Patel is an 76 y.o., female MRN: [id number] DOB: 11-25-1940 Height: 5\' 2"  (157.5 cm) Weight: 88.1 kg (194 lb 4.8 oz)                                                                                                                                                  Insurance Information HMO:     PPO: yes     PCP:      IPA:      80/20:      OTHER: medicare advantage plan PRIMARY: [employer] Medicare      Policy#: [id number]      Subscriber: pt CM Name: Orvan July      Phone#: [phone]     Fax#: [phone] Pre-Cert#: [id number] approved for 7 days with Vevelyn Royals to f/u in 7 days    Employer: retired Benefits:  Phone #: [phone]     Name: 10/12/16 Eff. Date: 05/17/16     Deduct: none      Out of Pocket Max: $4000      Life Max: none CIR: $16o co pay per day days 1-10 then covered at 100%     SNF: no co pay days 1-20; $50 co pay per day days 21-100 Outpatient: $20 co pay per visit     Co-Pay: visits per medical necessity Home Health: 100%      Co-Pay: visits per medical necessity DME: 80%     Co-Pay: 20% Providers:  In network  SECONDARY: none        Medicaid Application Date:       Case Manager:  Disability Application Date:       Case Worker:   Emergency Tax adviser Information    Name Relation Home Work Mobile   Korpela,Wayne Spouse [phone]  [phone]     Current Medical History  Patient Admitting Diagnosis:  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 CT head done revealing suspicion of acute right basal infarct, subacute right occipital infarct, left basal infarct question age and extensive small vessel disease. She receive TPA and continues on ASA for secondary stroke prevention. MRI brain done revealing acute infracts right basal ganglia nd anteiro right parietal white matter with chronic right occipital cortical infarct.  CTA head/neck showed severe progressive advanced intracranial atherosclerosis. 2 D echo revealed EF 60-65%, with grade one diastolic dysfunction, severe aspiration risk--NPO. She reported severe HA yesterday and repeat CT head without acute changes. Patient with sever dysarthria, severe expressive deficits, poor safety as well as decrease in mobility and ability to carry out ADL tasks.   Total: 4 NIHSS  Past Medical History      Past Medical History:  Diagnosis Date  . Arthritis    'all over"  . Asthma   . Chronic pain   . COPD (chronic obstructive pulmonary disease) (HCC)    O2 per [place] at nights   . Depression   . Diabetes mellitus without complication ([place])    borderline- , states she was on med., but MD told her "everything is under control so I threw the bottle away"  . Fibromyalgia   . GERD (gastroesophageal reflux disease)   . Hypertension   . Neuromuscular disorder (HCC)    parkinson, neuropathy- both feet & hands.  . Stroke [facility])    residual dysarthria    Family History  family history includes Cancer in her father; Heart disease in her father and mother.  Prior Rehab/Hospitalizations:  Has the patient had major surgery during 100 days prior to admission? No   CIR 03/2016 with d/c home after discharge. Was at [facility] until January  Current Medications   Current  Facility-Administered Medications:  .  0.9 %  sodium chloride infusion, , Intravenous, Continuous, [place], Early Chars, PA-C, Last Rate: 75 mL/hr at 10/11/16 0435 .  acetaminophen (TYLENOL) tablet 650 mg, 650 mg, Oral, Q4H PRN, 650 mg at 10/11/16 2225 **OR** acetaminophen (TYLENOL) solution 650 mg, 650 mg, Per Tube, Q4H PRN **OR** acetaminophen (TYLENOL) suppository 650 mg, 650 mg, Rectal, Q4H PRN, [name], MD .  clopidogrel (PLAVIX) tablet 75 mg, 75 mg, Oral, Daily, Garvin Fila, MD, 75 mg at 10/12/16 1027 .  insulin aspart (novoLOG) injection 2-6 Units, 2-6 Units, Subcutaneous, Q4H, [name], MD, 2 Units at 10/12/16 0830 .  MEDLINE mouth rinse, 15 mL, Mouth Rinse, q12n4p, Sarina Ill B, MD, 15 mL at 10/11/16 1200 .  pantoprazole (PROTONIX) injection 40 mg, 40 mg, Intravenous, QHS, [name], MD, 40 mg at 10/11/16 2225 .  RESOURCE THICKENUP CLEAR, , Oral, PRN, Garvin Fila, MD .  rosuvastatin (CRESTOR) tablet 40 mg, 40 mg, Oral, q1800, Garvin Fila, MD, 40 mg at 10/11/16 1818  Patients Current Diet: DIET - DYS 1 Room service appropriate? Yes; Fluid consistency: Honey Thick. Meds whole in puree. No straws  Precautions / Restrictions Precautions Precautions: Fall Precaution Comments: full supervision for eating, puree diet, honey thick liquids Restrictions Weight Bearing Restrictions: No   Has the patient had 2 or more falls or a fall with injury in the past year?Yes spouse explains that pt leaves a lot of things at her feet when she is seated and then trips over these things. Maybe 3 falls since January  Prior Activity Level Limited Community (1-2x/wk): ambulatory without AD since receiving oupt therapy in January  Home Assistive Devices / [place] Devices/Equipment: Eyeglasses, Other (Comment) (Walker and cane, but does not use) Home Equipment: Civil engineer, contracting - built in, [employer] - tub/shower, [employer] - single point,  Bedside commode  Prior Device Use: Indicate devices/aids used by the patient prior to current illness, exacerbation or injury? None of the above  Prior Functional Level Prior Function Level of  Independence: Independent  Self Care: Did the patient need help bathing, dressing, using the toilet or eating?  Independent  Indoor Mobility: Did the patient need assistance with walking from room to room (with or without device)? Independent  Stairs: Did the patient need assistance with internal or external stairs (with or without device)? Needed some help  Functional Cognition: Did the patient need help planning regular tasks such as shopping or remembering to take medications? Needed some help  Current Functional Level Cognition  Arousal/Alertness: Awake/alert Overall Cognitive Status: Impaired/Different from baseline Current Attention Level: Sustained Orientation Level: Oriented X4 Following Commands: Follows multi-step commands inconsistently, Follows multi-step commands with increased time Safety/Judgement: Decreased awareness of safety, Decreased awareness of deficits General Comments: administerred DGI and pt required multiple v/c's for each task on assessment Attention: Sustained, Selective Sustained Attention: Impaired Sustained Attention Impairment: Verbal basic, Functional basic Selective Attention: Impaired Selective Attention Impairment: Verbal basic, Functional basic    Extremity Assessment (includes Sensation/Coordination)  Upper Extremity Assessment: RUE deficits/detail, LUE deficits/detail RUE Deficits / Details: using functionally. slower movemetns; per husband, hx of R shoulder pain. not painful on eval LUE Deficits / Details: generalized weakness but AROM WFL for ADL. slower movements but using funcitonally LUE Coordination: decreased fine motor  Lower Extremity Assessment: RLE deficits/detail, LLE deficits/detail RLE Deficits / Details: grossly >=4/5, some  proximal weakness more coordination than L LE RLE Coordination: decreased fine motor LLE Deficits / Details: LLE appears weaker than R. More difficulty with heel to shin LLE Coordination: decreased fine motor    ADLs  Overall ADL's : Needs assistance/impaired Eating/Feeding: NPO Grooming: Minimal assistance, Sitting Upper Body Bathing: Minimal assistance, Sitting Lower Body Bathing: Moderate assistance, Sit to/from stand Upper Body Dressing : Minimal assistance, Sitting Lower Body Dressing: Maximal assistance, Sit to/from stand Toilet Transfer: Moderate assistance, BSC, Stand-pivot Toileting- Clothing Manipulation and Hygiene: Moderate assistance, Sit to/from stand Toileting - Clothing Manipulation Details (indicate cue type and reason): using L hand for pericare Functional mobility during ADLs: Moderate assistance    Mobility  Overal bed mobility: Needs Assistance Bed Mobility: Supine to Sit Supine to sit: Mod assist, Min assist (min to come up to sitting and mod to help coord. scoot.) General bed mobility comments: pt up in chair upon PT arrival    Transfers  Overall transfer level: Needs assistance Transfers: Sit to/from Stand Sit to Stand: Min assist Stand pivot transfers: Min assist General transfer comment: v/c's to push up from chair, v/c's to slow down, posterior lean initially    Ambulation / Gait / Stairs / Wheelchair Mobility  Ambulation/Gait Ambulation/Gait assistance: Museum/gallery curator (Feet): 150 Feet Assistive device: 1 person hand held assist Gait Pattern/deviations: Step-through pattern, Decreased stride length, Shuffle, Wide base of support, Staggering left, Staggering right General Gait Details: pt unsteady requiring minA to maintain balance. pt with staggering L/R despite wide base of support and decreased step height Gait velocity: varied between slow and fast Stairs: Yes Stairs assistance: Min assist Stair Management: One rail  Right Number of Stairs: 12 General stair comments: pt required minA and increased time. v/c's for step to pattern as pt unable to step up with R LE due to weakness    Posture / Balance Dynamic Sitting Balance Sitting balance - Comments: accepted moderate challenge and generally maintained midline Balance Overall balance assessment: Needs assistance Sitting-balance support: No upper extremity supported Sitting balance-Leahy Scale: Good Sitting balance - Comments: accepted moderate challenge and generally maintained midline Standing balance support: No upper extremity supported  Standing balance-Leahy Scale: Poor Standing balance comment: for stability assist needed external support. Standardized Balance Assessment Standardized Balance Assessment : Dynamic Gait Index Dynamic Gait Index Level Surface: Mild Impairment Change in Gait Speed: Mild Impairment Gait with Horizontal Head Turns: Mild Impairment Gait with Vertical Head Turns: Moderate Impairment Gait and Pivot Turn: Moderate Impairment Step Over Obstacle: Mild Impairment Step Around Obstacles: Mild Impairment Steps: Moderate Impairment Total Score: 13    Special needs/care consideration BiPAP/CPAP n/a CPM  N/a Continuous Drip IV  N/a Dialysis  N/a Life Vest n/a Oxygen oxygen at night 3 liters [place] Special Bed  N/a Trach Size  N/a Wound Vac n/a Skin intact                             Bowel mgmt:continent LBM 10/11/16 Bladder mgmt:some incontinence Diabetic mgmt Hgb A1c 6.3   Previous Home Environment Living Arrangements: Spouse/significant other  Lives With: Spouse Available Help at Discharge: Available 24 hours/day Type of Home: House Home Layout: Two level, Able to live on main level with bedroom/bathroom Home Access: Ramped entrance, Stairs to enter Entrance Stairs-Rails: Right Entrance Stairs-Number of Steps: 4 Bathroom Shower/Tub:  (walk in tub with door) Bathroom Toilet: Standard Bathroom Accessibility:  Yes How Accessible: Accessible via walker [place]: No  Discharge Living Setting Plans for Discharge Living Setting: Patient's home, Lives with (comment) (spouse) Type of Home at Discharge: House Discharge Home Layout: Two level, Able to live on main level with bedroom/bathroom Discharge Home Access: Stairs to enter, Ramped entrance Entrance Stairs-Rails: Right Entrance Stairs-Number of Steps: 4 Discharge Bathroom Shower/Tub:  (walk in tub) Discharge Bathroom Toilet: Standard Discharge Bathroom Accessibility: Yes How Accessible: Accessible via walker Does the patient have any problems obtaining your medications?: No  Social/Family/Support Systems Patient Roles: Spouse Contact Information: [name], spouse Anticipated Caregiver: spouse Anticipated Ambulance person Information: see above Ability/Limitations of Caregiver: none Caregiver Availability: 24/7 Discharge Plan Discussed with Primary Caregiver: Yes Is Caregiver In Agreement with Plan?: Yes Does Caregiver/Family have Issues with Lodging/Transportation while Pt is in Rehab?: No  Goals/Additional Needs Patient/Family Goal for Rehab: Mod I with Pt, OT, and SLP Expected length of stay: ELOS 7 days Pt/Family Agrees to Admission and willing to participate: Yes Program Orientation Provided & Reviewed with Pt/Caregiver Including Roles  & Responsibilities: Yes  Decrease burden of Care through IP rehab admission: n/a  Possible need for SNF placement upon discharge: not anticipated  Patient Condition: This patient's condition remains as documented in the consult dated 10/11/2016, in which the Rehabilitation Physician determined and documented that the patient's condition is appropriate for intensive rehabilitative care in an inpatient rehabilitation facility. Will admit to inpatient rehab today.  Preadmission Screen Completed By:  Cleatrice Burke, 10/12/2016 1:47  PM ______________________________________________________________________   Discussed status with Dr. Naaman Plummer on 10/12/2016 at  1347 and received telephone approval for admission today.  Admission Coordinator:  Cleatrice Burke, time [id number] Date 10/12/2016       Cosigned by: Meredith Staggers, MD at 10/12/2016 1:58 PM  Revision History

## 2016-10-13 ENCOUNTER — Inpatient Hospital Stay (HOSPITAL_COMMUNITY): Payer: Medicare Other | Admitting: Speech Pathology

## 2016-10-13 ENCOUNTER — Inpatient Hospital Stay (HOSPITAL_COMMUNITY): Payer: Medicare Other | Admitting: Occupational Therapy

## 2016-10-13 ENCOUNTER — Inpatient Hospital Stay (HOSPITAL_COMMUNITY): Payer: Medicare Other | Admitting: Physical Therapy

## 2016-10-13 DIAGNOSIS — E876 Hypokalemia: Secondary | ICD-10-CM

## 2016-10-13 DIAGNOSIS — E8809 Other disorders of plasma-protein metabolism, not elsewhere classified: Secondary | ICD-10-CM

## 2016-10-13 DIAGNOSIS — E119 Type 2 diabetes mellitus without complications: Secondary | ICD-10-CM

## 2016-10-13 DIAGNOSIS — Z9981 Dependence on supplemental oxygen: Secondary | ICD-10-CM

## 2016-10-13 DIAGNOSIS — J449 Chronic obstructive pulmonary disease, unspecified: Secondary | ICD-10-CM

## 2016-10-13 DIAGNOSIS — I10 Essential (primary) hypertension: Secondary | ICD-10-CM

## 2016-10-13 DIAGNOSIS — R278 Other lack of coordination: Secondary | ICD-10-CM

## 2016-10-13 DIAGNOSIS — E46 Unspecified protein-calorie malnutrition: Secondary | ICD-10-CM

## 2016-10-13 DIAGNOSIS — I69391 Dysphagia following cerebral infarction: Secondary | ICD-10-CM

## 2016-10-13 LAB — COMPREHENSIVE METABOLIC PANEL
ALT: 14 U/L (ref 14–54)
AST: 22 U/L (ref 15–41)
Albumin: 3.3 g/dL — ABNORMAL LOW (ref 3.5–5.0)
Alkaline Phosphatase: 128 U/L — ABNORMAL HIGH (ref 38–126)
Anion gap: 9 (ref 5–15)
BILIRUBIN TOTAL: 0.7 mg/dL (ref 0.3–1.2)
BUN: 10 mg/dL (ref 6–20)
CHLORIDE: 108 mmol/L (ref 101–111)
CO2: 25 mmol/L (ref 22–32)
Calcium: 9 mg/dL (ref 8.9–10.3)
Creatinine, Ser: 0.82 mg/dL (ref 0.44–1.00)
Glucose, Bld: 110 mg/dL — ABNORMAL HIGH (ref 65–99)
POTASSIUM: 3.2 mmol/L — AB (ref 3.5–5.1)
Sodium: 142 mmol/L (ref 135–145)
TOTAL PROTEIN: 6.4 g/dL — AB (ref 6.5–8.1)

## 2016-10-13 LAB — CBC WITH DIFFERENTIAL/PLATELET
Basophils Absolute: 0.1 10*3/uL (ref 0.0–0.1)
Basophils Relative: 1 %
EOS PCT: 5 %
Eosinophils Absolute: 0.4 10*3/uL (ref 0.0–0.7)
HEMATOCRIT: 39.6 % (ref 36.0–46.0)
Hemoglobin: 12.6 g/dL (ref 12.0–15.0)
LYMPHS PCT: 39 %
Lymphs Abs: 3.5 10*3/uL (ref 0.7–4.0)
MCH: 27.9 pg (ref 26.0–34.0)
MCHC: 31.8 g/dL (ref 30.0–36.0)
MCV: 87.6 fL (ref 78.0–100.0)
MONO ABS: 0.6 10*3/uL (ref 0.1–1.0)
Monocytes Relative: 7 %
NEUTROS ABS: 4.5 10*3/uL (ref 1.7–7.7)
Neutrophils Relative %: 48 %
Platelets: 313 10*3/uL (ref 150–400)
RBC: 4.52 MIL/uL (ref 3.87–5.11)
RDW: 13.7 % (ref 11.5–15.5)
WBC: 9.1 10*3/uL (ref 4.0–10.5)

## 2016-10-13 LAB — GLUCOSE, CAPILLARY
GLUCOSE-CAPILLARY: 74 mg/dL (ref 65–99)
GLUCOSE-CAPILLARY: 117 mg/dL — AB (ref 65–99)
Glucose-Capillary: 82 mg/dL (ref 65–99)

## 2016-10-13 MED ORDER — POTASSIUM CHLORIDE 20 MEQ PO PACK
20.0000 meq | PACK | Freq: Two times a day (BID) | ORAL | Status: AC
  Administered 2016-10-13 – 2016-10-14 (×3): 20 meq via ORAL
  Filled 2016-10-13 (×4): qty 1

## 2016-10-13 MED ORDER — PANTOPRAZOLE SODIUM 40 MG PO TBEC: 40.0000 mg | DELAYED_RELEASE_TABLET | Freq: Every day | ORAL | Status: DC

## 2016-10-13 MED ORDER — PANTOPRAZOLE SODIUM 40 MG PO PACK
40.0000 mg | PACK | Freq: Every day | ORAL | Status: DC
  Administered 2016-10-13 – 2016-10-20 (×8): 40 mg
  Filled 2016-10-13 (×7): qty 20

## 2016-10-13 MED ORDER — INSULIN ASPART 100 UNIT/ML ~~LOC~~ SOLN: 0.0000 [IU] | Freq: Every day | SUBCUTANEOUS | Status: DC

## 2016-10-13 MED ORDER — POTASSIUM CHLORIDE IN NACL 20-0.9 MEQ/L-% IV SOLN
INTRAVENOUS | Status: DC
  Filled 2016-10-13: qty 1000

## 2016-10-13 MED ORDER — INSULIN ASPART 100 UNIT/ML ~~LOC~~ SOLN
0.0000 [IU] | Freq: Three times a day (TID) | SUBCUTANEOUS | Status: DC
  Administered 2016-10-16 – 2016-10-17 (×2): 1 [IU] via SUBCUTANEOUS

## 2016-10-13 MED ORDER — PRO-STAT SUGAR FREE PO LIQD
30.0000 mL | Freq: Two times a day (BID) | ORAL | Status: DC
  Administered 2016-10-13 – 2016-10-20 (×15): 30 mL via ORAL
  Filled 2016-10-13 (×15): qty 30

## 2016-10-13 MED ORDER — POTASSIUM CHLORIDE CRYS ER 20 MEQ PO TBCR: 20.0000 meq | EXTENDED_RELEASE_TABLET | Freq: Two times a day (BID) | ORAL | Status: DC

## 2016-10-13 MED ORDER — POTASSIUM CHLORIDE 2 MEQ/ML IV SOLN
INTRAVENOUS | Status: DC
  Administered 2016-10-13 – 2016-10-14 (×2): via INTRAVENOUS
  Filled 2016-10-13 (×5): qty 1000

## 2016-10-13 NOTE — Evaluation (Addendum)
Physical Therapy Assessment and Plan  Patient Details  Name: KELIE GAINEY MRN: [id number] Date of Birth: [birth year]  PT Diagnosis: Hemiplegia (L), Impaired cognition, Impaired balance, and Muscle weakness Rehab Potential: Excellent ELOS: 1 week   Today's Date: 10/13/2016 PT Individual Time: 0905-1000 PT Individual Time Calculation (min): 55 min    Problem List:  Patient Active Problem List   Diagnosis Date Noted  . Dysmetria   . Diabetes mellitus type 2 in nonobese (HCC)   . Supplemental oxygen dependent   . Benign essential HTN   . Hypokalemia   . Hypoalbuminemia due to protein-calorie malnutrition (HCC)   . 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 Oropharyngeal dysphagia   . Stroke (HCC) 10/09/2016  . Stroke (cerebrum) (HCC) 10/09/2016  . 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 Frequent falls 04/21/2016  . 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 Type 2 diabetes mellitus with peripheral neuropathy (HCC)   . Fibromyalgia   . Chronic pain syndrome   . Chronic obstructive pulmonary disease (HCC)   . Gait disturbance, post-stroke   . Dysarthria, post-stroke   . Dysphagia, post-stroke   . 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 Mixed hyperlipidemia   . 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  . Allergic rhinitis 12/17/2015  . Primary osteoarthritis of both knees 11/19/2015  . Obesity (BMI 30-39.9) 09/04/2015  . Essential hypertension 09/04/2015  . Constipation 09/04/2015  . Asthma 08/26/2015  . Raynaud's phenomenon 08/26/2015  . Gastro-esophageal reflux disease with esophagitis 08/26/2015  . Spinal stenosis 08/26/2015  . Vitamin D deficiency 08/26/2015  . Diabetes mellitus, type 2 (HCC) 08/22/2015  . HNP (herniated nucleus pulposus), cervical 07/10/2015  . Cervical radicular pain 04/30/2013  . Knee osteoarthritis 01/05/2012    Past Medical History:   Past Medical History:  Diagnosis Date  . Arthritis    'all over"  . Asthma   . Chronic pain   . COPD (chronic obstructive pulmonary disease) (HCC)    O2 per [place] at nights   . Depression   . Diabetes mellitus without complication (HCC)    borderline- , states she was on med., but MD told her "everything is under control so I threw the bottle away"  . Fibromyalgia   . GERD (gastroesophageal reflux disease)   . Hypertension   . Neuromuscular disorder (HCC)    parkinson, neuropathy- both feet & hands.  . Stroke [facility])    residual dysarthria   Past Surgical History:  Past Surgical History:  Procedure Laterality Date  . ABDOMINAL HYSTERECTOMY    . ANTERIOR CERVICAL DECOMP/DISCECTOMY FUSION N/A 07/10/2015   Procedure: Cervical five-six, Cervical six-seven anterior cervical decompression with fusion plating and bonegraft;  Surgeon: Shirlean Kelly, MD;  Location: [facility];  Service: Neurosurgery;  Laterality: N/A;  . APPENDECTOMY    . BIOPSY EYE MUSCLE  03/20/2016   biopsy vessel to right eye due to swelling  . EYE SURGERY Bilateral    cataracts removed, /w "cyrstal lenses"   . SHOULDER ARTHROSCOPY Right    x2   RCR- spurs removed     Assessment & Plan Clinical Impression: Patient is a 76 y.o. year old female withCOPD- oxygen dependent, T2DM, Neuropathy, chronic pain, CVA with residual visual deficits?, Parkinsonism; who was admitted on 10/09/16 with inability to speak, left sided weakness and difficulty walking. CT head done revealing suspicion of acute right basal ganglia infarct,  subacute right occipital infarct, left basal infarct question age and extensive small vessel disease. She receive tPA and continues on ASA for secondary stroke prevention. MRI brain done revealing acute infarcts right basal ganglia anteriorright parietal white matter with chronic right occipital cortical infarct. CTA head/neck showed severe progressive advanced intracranial atherosclerosis. 2 D echo  revealed EF 60-65%, with grade one diastolic dysfunction. MBS done yesterday and she was started on dysphagia 1, honey liquids. ASA changed to plavix due to progressive intracranial atherosclerosis and stroke felt to be thrombotic due to small vessel disease. She reported severe 5/27and repeat CT head without acute changes. Patient with severedysarthria, severe expressive deficits, poor safety as well as decrease in mobility and ability to carry out ADL tasks. CIR recommended by rehab team.  Patient transferred to CIR on 10/12/2016.   Patient currently requires supervision with mobility secondary to muscle weakness, decreased cardiorespiratory endurance, decreased coordination, decreased visual acuity (following stroke in November 2017), decreased problem solving and decreased safety awareness, and decreased standing balance, decreased postural control, hemiplegia and decreased balance strategies.  Prior to hospitalization, patient was ambulatory without AD with mobility and lived with Spouse in a House home.  Home access is 4Stairs to enter, Ramped entrance.  Patient will benefit from skilled PT intervention to maximize safe functional mobility, minimize fall risk and decrease caregiver burden for planned discharge home with intermittent assist.  Anticipate patient will benefit from follow up OP at discharge.  PT - End of Session Activity Tolerance: Tolerates 30+ min activity with multiple rests Endurance Deficit: Yes PT Assessment Rehab Potential (ACUTE/IP ONLY): Excellent PT Patient demonstrates impairments in the following area(s): Balance;Behavior;Endurance;Motor;Safety;Sensory;Perception PT Transfers Functional Problem(s): Bed Mobility;Bed to Chair;Car;Furniture;Floor PT Locomotion Functional Problem(s): Ambulation;Stairs PT Plan PT Intensity: Minimum of 1-2 x/day ,45 to 90 minutes PT Frequency: 5 out of 7 days PT Duration Estimated Length of Stay: 1 week PT Treatment/Interventions:  Community reintegration;Ambulation/gait training;DME/adaptive equipment instruction;Neuromuscular re-education;Psychosocial support;Stair training;UE/LE Strength taining/ROM;UE/LE Coordination activities;Therapeutic Activities;Pain management;Balance/vestibular training;Discharge planning;Cognitive remediation/compensation;Disease management/prevention;Functional mobility training;Patient/family education;Therapeutic Exercise;Visual/perceptual remediation/compensation PT Transfers Anticipated Outcome(s): mod I with LRAD PT Locomotion Anticipated Outcome(s): mod I with LRAD PT Recommendation Recommendations for Other Services: Speech consult;Therapeutic Recreation consult Therapeutic Recreation Interventions: Pet therapy;Outing/community reintergration Follow Up Recommendations: Outpatient PT Patient destination: Home Equipment Recommended: To be determined  Skilled Therapeutic Intervention Pt received in room & agreeable to tx. Educated pt on ELOS, weekly interdisciplinary team meeting, daily therapy schedule, and other various CIR information. Pt completed functional mobility tasks with supervision overall without AD, please see above & below for details. Pt with great difficulty performing bed mobility, requiring close supervision, extra time, and heavy verbal cuing. Pt completed Berg Balance Test & scored 37/56. Patient demonstrates increased fall risk as noted by score of 37/56 on Berg Balance Scale.  (<36= high risk for falls, close to 100%; 37-45 significant >80%; 46-51 moderate >50%; 52-55 lower >25%). At end of session pt left sitting on EOB with bed alarm set & all needs within reach.    PT Evaluation Precautions/Restrictions Precautions Precautions: Fall Restrictions Weight Bearing Restrictions: No   General Chart Reviewed: Yes Additional Pertinent History: COPD (O2 at night), DM 2, neuropathy, chronic pain, Parkinsonism, chronic pain, depression, fibromyalgia, HTN, hx of  stroke Response to Previous Treatment: Patient with no complaints from previous session. Family/Caregiver Present: No   Vital Signs Therapy Vitals Pulse Rate: 80 BP: (!) 160/74 Patient Position (if appropriate): Sitting Oxygen Therapy SpO2: 97 % O2 Device: Not Delivered  Pain Pain Assessment  Pain Assessment: No/denies pain  Home Living/Prior Functioning Home Living Living Arrangements: Spouse/significant other Available Help at Discharge: Available 24 hours/day;Family Type of Home: House Home Access: Stairs to enter;Ramped entrance Entrance Stairs-Number of Steps: 4 Entrance Stairs-Rails: Right;Left;Can reach both Home Layout: Two level;Able to live on main level with bedroom/bathroom (bed/bath on first level, goes to 2nd level intermittently) Alternate Level Stairs-Number of Steps:  (uses lift chair to access 2nd level PTA)  Lives With: Spouse (husband - [name]) Prior Function Level of Independence: Independent with basic ADLs;Independent with gait;Independent with transfers;Independent with homemaking with ambulation  Able to Take Stairs?: Yes Driving: Yes Vocation: Retired Leisure: Hobbies-yes (Comment) Comments: reading, playing piano  Vision/Perception  Pt reports she wears glasses for reading only at baseline. Pt reports blurry vision in R eye since November, but no changes in vision since this admission to hospital.  Cognition Overall Cognitive Status: Difficult to assess Arousal/Alertness: Awake/alert Orientation Level: Oriented X4   Sensation Sensation Light Touch: Appears Intact (pt reports numbness/tingling in hands/feet 2/2 DM & fibromyalgia; BLE equal & intact to light touch testing) Proprioception: Appears Intact (BLE)  Motor  Motor Motor: Hemiplegia (general weakness, L hemi)   Mobility Bed Mobility Bed Mobility: Rolling Right;Rolling Left;Supine to Sit;Sit to Supine Rolling Right: 5: Supervision (max verbal cuing for technique) Rolling Left: 5:  Supervision (max verbal cuing for technique) Supine to Sit: 5: Supervision Sit to Supine: 5: Supervision Transfers Transfers: Yes Sit to Stand: 5: Supervision Stand Pivot Transfers: 5: Supervision  Locomotion  Ambulation Ambulation: Yes Ambulation/Gait Assistance: 5: Supervision Ambulation Distance (Feet): 150 Feet Assistive device: None Gait Gait: Yes (decreased gait speed) Stairs / Additional Locomotion Stairs: Yes Stairs Assistance: 5: Supervision Stair Management Technique: One rail Right (reciprocal pattern) Number of Stairs: 12 Height of Stairs:  (6 inches + 3 inches) Ramp: 5: Supervision (without AD) Wheelchair Mobility Wheelchair Mobility: No   Balance Balance Balance Assessed: Yes Standardized Balance Assessment Standardized Balance Assessment: Berg Balance Test Berg Balance Test Sit to Stand: Able to stand without using hands and stabilize independently Standing Unsupported: Able to stand safely 2 minutes Sitting with Back Unsupported but Feet Supported on Floor or Stool: Able to sit safely and securely 2 minutes Stand to Sit: Sits safely with minimal use of hands Transfers: Able to transfer with verbal cueing and /or supervision Standing Unsupported with Eyes Closed: Able to stand 10 seconds with supervision Standing Ubsupported with Feet Together: Able to place feet together independently and stand for 1 minute with supervision From Standing, Reach Forward with Outstretched Arm: Can reach forward >5 cm safely (2") From Standing Position, Pick up Object from Floor: Able to pick up shoe, needs supervision From Standing Position, Turn to Look Behind Over each Shoulder: Turn sideways only but maintains balance Turn 360 Degrees: Able to turn 360 degrees safely in 4 seconds or less Standing Unsupported, Alternately Place Feet on Step/Stool: Needs assistance to keep from falling or unable to try Standing Unsupported, One Foot in Front: Able to take small step  independently and hold 30 seconds Standing on One Leg: Unable to try or needs assist to prevent fall Total Score: 37  Extremity Assessment  RLE Assessment RLE Assessment: Within Functional Limits LLE Assessment LLE Assessment: Within Functional Limits   See Function Navigator for Current Functional Status.   Refer to Care Plan for Long Term Goals  Recommendations for other services: Therapeutic Recreation  Pet therapy and Outing/community reintegration  Discharge Criteria: Patient will be discharged from PT if patient  refuses treatment 3 consecutive times without medical reason, if treatment goals not met, if there is a change in medical status, if patient makes no progress towards goals or if patient is discharged from hospital.  The above assessment, treatment plan, treatment alternatives and goals were discussed and mutually agreed upon: by patient  Waunita Schooner 10/13/2016, 12:26 PM

## 2016-10-13 NOTE — Plan of Care (Signed)
Problem: RH Ambulation Goal: LTG Patient will ambulate in controlled environment (PT) LTG: Patient will ambulate in a controlled environment, # of feet with assistance (PT). 150 ft with LRAD Goal: LTG Patient will ambulate in home environment (PT) LTG: Patient will ambulate in home environment, # of feet with assistance (PT). 57 ft with LRAD Goal: LTG Patient will ambulate in community environment (PT) LTG: Patient will ambulate in community environment, # of feet with assistance (PT). 150 ft with LRAD  Problem: RH Stairs Goal: LTG Patient will ambulate up and down stairs w/assist (PT) LTG: Patient will ambulate up and down # of stairs with assistance (PT) 4 steps with B rails for home access

## 2016-10-13 NOTE — Progress Notes (Signed)
Social Work Brad Lieurance, Eliezer Champagne Social Worker Signed   Patient Care Conference Date of Service: 10/13/2016  2:01 PM      Hide copied text Hover for attribution information Inpatient RehabilitationTeam Conference and Plan of Care Update Date: 10/13/2016   Time: 10:00 AM      Patient Name: Susan Patel      Medical Record Number: [id number]  Date of Birth: [birth year] Sex: Female         Room/Bed: 4M04C/4M04C-01 Payor Info: Payor: MEDICARE / Plan: MEDICARE PART A AND B / Product Type: *No Product type* /     Admitting Diagnosis: R CVA  Admit Date/Time:  10/12/2016  5:55 PM Admission Comments: No comment available    Primary Diagnosis:  <principal problem not specified> Principal Problem: <principal problem not specified>       Patient Active Problem List    Diagnosis Date Noted  . Dysmetria    . Diabetes mellitus type 2 in nonobese (HCC)    . Supplemental oxygen dependent    . Benign essential HTN    . Hypokalemia    . Hypoalbuminemia due to protein-calorie malnutrition ([place])    . Embolic stroke of right basal ganglia ([place]) 10/12/2016  . Oropharyngeal dysphagia    . Stroke ([place]) 10/09/2016  . Stroke (cerebrum) ([place]) 10/09/2016  . 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 Frequent falls 04/21/2016  . 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 Type 2 diabetes mellitus with peripheral neuropathy (HCC)    . Fibromyalgia    . Chronic pain syndrome    . Chronic obstructive pulmonary disease ([place])    . Gait disturbance, post-stroke    . Dysarthria, post-stroke    . Dysphagia, post-stroke    . Cerebrovascular accident (CVA) due to occlusion of basilar artery ([place])    . Mixed hyperlipidemia    . 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  . Allergic rhinitis 12/17/2015  . Primary osteoarthritis of both knees 11/19/2015  . Obesity (BMI 30-39.9) 09/04/2015  . Essential hypertension 09/04/2015  . Constipation 09/04/2015  .  Asthma 08/26/2015  . Raynaud's phenomenon 08/26/2015  . Gastro-esophageal reflux disease with esophagitis 08/26/2015  . Spinal stenosis 08/26/2015  . Vitamin D deficiency 08/26/2015  . Diabetes mellitus, type 2 ([place]) 08/22/2015  . HNP (herniated nucleus pulposus), cervical 07/10/2015  . Cervical radicular pain 04/30/2013  . Knee osteoarthritis 01/05/2012      Expected Discharge Date:     Team Members Present: Physician leading conference: Dr. Delice Lesch Social Worker Present: Ovidio Kin, LCSW Nurse Present: Other (comment) Delia Chimes) PT Present: Jorge Mandril, PT OT Present: Clyda Greener, OT       Current Status/Progress Goal Weekly Team Focus  Medical      Functional and swallowing deficits secondary to right basal ganglia /parietal infarcts on 5/26  Improve mobility, cognition, DM, HTN, hypokalemia  See above   Bowel/Bladder               Swallow/Nutrition/ Hydration     Dys. 1 textures with honey-thick liquids, Full supervision-Overall Mod A  Min A  increase use of swallowing strategies. trials of upgraded liquids   ADL's       eval pending        Mobility     supervision overall without AD  mod I overall  balance, high level gait training, pt education, strengthening, NMR   Communication     Max A  Mod A  speech intelligibility    Safety/Cognition/ Behavioral Observations   Min A  Supervision  safety awareness   Pain               Skin                 *See Care Plan and progress notes for long and short-term goals.   Barriers to Discharge: Mobility, cognition, DM, HTN, hypokalemia     Possible Resolutions to Barriers:  Therapies, optimize DM, HTN, follow labs, supplement K+     Discharge Planning/Teaching Needs:    Home with husband who can provide care like he did last time she was here on rehab.     Team Discussion:  Evals pending MD working on medical stability and management. SP has evaluated.  Revisions to Treatment Plan:  New eval      Continued Need for Acute Rehabilitation Level of Care: The patient requires daily medical management by a physician with specialized training in physical medicine and rehabilitation for the following conditions: Daily direction of a multidisciplinary physical rehabilitation program to ensure safe treatment while eliciting the highest outcome that is of practical value to the patient.: Yes Daily medical management of patient stability for increased activity during participation in an intensive rehabilitation regime.: Yes Daily analysis of laboratory values and/or radiology reports with any subsequent need for medication adjustment of medical intervention for : Neurological problems;Diabetes problems;Blood pressure problems;Other   Chia Mowers, Gardiner Rhyme 10/13/2016, 2:02 PM      Elease Hashimoto, LCSW Social Worker Signed   Patient Care Conference Date of Service: 03/30/2016  2:48 PM      Hide copied text Hover for attribution information Inpatient RehabilitationTeam Conference and Plan of Care Update Date: 03/31/2016   Time: 11:00AM      Patient Name: Susan Patel      Medical Record Number: [id number]  Date of Birth: 06/23/1940 Sex: Female         Room/Bed: 4M08C/4M08C-01 Payor Info: Payor: MEDICARE / Plan: MEDICARE PART A AND B / Product Type: *No Product type* /     Admitting Diagnosis: CVA  Admit Date/Time:  03/23/2016  3:55 PM Admission Comments: No comment available    Primary Diagnosis:  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 Type 2 diabetes mellitus with peripheral neuropathy (HCC)    . Benign essential HTN    . Fibromyalgia    . Chronic pain syndrome    . Chronic obstructive pulmonary disease ([place])    . Gait disturbance, post-stroke    .  Dysarthria, post-stroke    . Dysphagia, post-stroke    . AKI (acute kidney injury) ([place])    . Acute blood loss anemia    . 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 Mixed hyperlipidemia    . Stroke ([place]) 03/20/2016  . CVA (cerebral vascular accident) ([place]) 03/20/2016  . Allergic rhinitis 12/17/2015  . Primary osteoarthritis of both knees 11/19/2015  . Obesity (BMI 30-39.9) 09/04/2015  . Essential hypertension 09/04/2015  . Constipation 09/04/2015  . Asthma 08/26/2015  . Raynaud's phenomenon 08/26/2015  . Gastro-esophageal reflux disease with esophagitis 08/26/2015  . Spinal stenosis 08/26/2015  . Vitamin  D deficiency 08/26/2015  . Diabetes mellitus, type 2 ([place]) 08/22/2015  . HNP (herniated nucleus pulposus), cervical 07/10/2015  . Cervical radicular pain 04/30/2013  . Knee osteoarthritis 01/05/2012      Expected Discharge Date: Expected Discharge Date: 03/31/16   Team Members Present: Physician leading conference: Dr. Alysia Penna Social Worker Present: Ovidio Kin, LCSW Nurse Present: Heather Roberts, RN PT Present: Dwyane Dee, PT OT Present: Simonne Come, OT SLP Present: Windell Moulding, SLP PPS Coordinator present : Daiva Nakayama, RN, CRRN       Current Status/Progress Goal Weekly Team Focus  Medical     ready for DC   medical stability-HTN & DM     Bowel/Bladder   continent of bowel and bladder  Remain continent of bowel and bladder  Assist with toileting needs    Swallow/Nutrition/ Hydration   upgraded to regular, thin liquids   mod I   education complete, husband able to provide supervision during meals to reinforce use of swallowing precautions.    ADL's   Supervision - Mod I  Supervision  d/c planning, family education   Mobility   mod I in controlled environment, supervision in community environment  supervision/mod I ambulatory  community outing, d/c planning, d/c Wednesday    Communication   moderate expressive>receptive  aphasia; pt and family education is complete at this time  supervision   pt is ready for discharge    Safety/Cognition/ Behavioral Observations moderate impairment  supervision   pt is ready for discharge, education is complete    Pain   No complaints of pain  Remain pain free  Assess and treat while in rehab   Skin   No skin problem  No new breakdown while in rehab  Continue monitoring the skin Q shift and PRN       *See Care Plan and progress notes for long and short-term goals.   Barriers to Discharge:   HTN controlled, diabetes managed-ready for DC today  Possible Resolutions to Barriers:    none  Discharge Planning/Teaching Needs:    Home with hubabnd who has been here and attended therapies with pt. Feel ready to go home today.     Team Discussion:  Pt reached her goals of supervision level and passed MBS so now on regular with thin liquids. Outing yesterday went well and she was supervision level in the community. Medically ready for DC   Revisions to Treatment Plan:  DC today        Elease Hashimoto 03/31/2016, 11:36 AM       Patient ID: Susan Patel, female   DOB: 22-Sep-1940, 76 y.o.   MRN: [id number]

## 2016-10-13 NOTE — Patient Care Conference (Signed)
Inpatient RehabilitationTeam Conference and Plan of Care Update Date: 10/13/2016   Time: 10:00 AM    Patient Name: Susan Patel      Medical Record Number: [id number]  Date of Birth: 1940-07-06 Sex: Female         Room/Bed: 4M04C/4M04C-01 Payor Info: Payor: MEDICARE / Plan: MEDICARE PART A AND B / Product Type: *No Product type* /    Admitting Diagnosis: R CVA  Admit Date/Time:  10/12/2016  5:55 PM Admission Comments: No comment available   Primary Diagnosis:  <principal problem not specified> Principal Problem: <principal problem not specified>  Patient Active Problem List   Diagnosis Date Noted  . Dysmetria   . Diabetes mellitus type 2 in nonobese (HCC)   . Supplemental oxygen dependent   . Benign essential HTN   . Hypokalemia   . Hypoalbuminemia due to protein-calorie malnutrition ([place])   . Embolic stroke of right basal ganglia ([place]) 10/12/2016  . Oropharyngeal dysphagia   . Stroke ([place]) 10/09/2016  . Stroke (cerebrum) ([place]) 10/09/2016  . 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 Frequent falls 04/21/2016  . 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 Type 2 diabetes mellitus with peripheral neuropathy (HCC)   . Fibromyalgia   . Chronic pain syndrome   . Chronic obstructive pulmonary disease ([place])   . Gait disturbance, post-stroke   . Dysarthria, post-stroke   . Dysphagia, post-stroke   . 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 Mixed hyperlipidemia   . 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  . Allergic rhinitis 12/17/2015  . Primary osteoarthritis of both knees 11/19/2015  . Obesity (BMI 30-39.9) 09/04/2015  . Essential hypertension 09/04/2015  . Constipation 09/04/2015  . Asthma 08/26/2015  . Raynaud's phenomenon 08/26/2015  . Gastro-esophageal reflux disease with esophagitis 08/26/2015  . Spinal stenosis 08/26/2015  . Vitamin D deficiency 08/26/2015  . Diabetes mellitus,  type 2 ([place]) 08/22/2015  . HNP (herniated nucleus pulposus), cervical 07/10/2015  . Cervical radicular pain 04/30/2013  . Knee osteoarthritis 01/05/2012    Expected Discharge Date:    Team Members Present: Physician leading conference: Dr. Delice Lesch Social Worker Present: Ovidio Kin, LCSW Nurse Present: Other (comment) Delia Chimes) PT Present: Jorge Mandril, PT OT Present: Clyda Greener, OT     Current Status/Progress Goal Weekly Team Focus  Medical    Functional and swallowing deficits secondary to right basal ganglia /parietal infarcts on 5/26  Improve mobility, cognition, DM, HTN, hypokalemia  See above   Bowel/Bladder             Swallow/Nutrition/ Hydration   Dys. 1 textures with honey-thick liquids, Full supervision-Overall Mod A  Min A  increase use of swallowing strategies. trials of upgraded liquids   ADL's     eval pending        Mobility   supervision overall without AD  mod I overall  balance, high level gait training, pt education, strengthening, NMR   Communication   Max A  Mod A  speech intelligibility    Safety/Cognition/ Behavioral Observations  Min A  Supervision  safety awareness   Pain             Skin                *See Care Plan and progress notes for long and short-term goals.  Barriers to Discharge: Mobility, cognition, DM, HTN, hypokalemia    Possible Resolutions to  Barriers:  Therapies, optimize DM, HTN, follow labs, supplement K+    Discharge Planning/Teaching Needs:    Home with husband who can provide care like he did last time she was here on rehab.     Team Discussion:  Evals pending MD working on medical stability and management. SP has evaluated.  Revisions to Treatment Plan:  New eval   Continued Need for Acute Rehabilitation Level of Care: The patient requires daily medical management by a physician with specialized training in physical medicine and rehabilitation for the following conditions: Daily direction of a  multidisciplinary physical rehabilitation program to ensure safe treatment while eliciting the highest outcome that is of practical value to the patient.: Yes Daily medical management of patient stability for increased activity during participation in an intensive rehabilitation regime.: Yes Daily analysis of laboratory values and/or radiology reports with any subsequent need for medication adjustment of medical intervention for : Neurological problems;Diabetes problems;Blood pressure problems;Other  Katai Marsico, Gardiner Rhyme 10/13/2016, 2:02 PM

## 2016-10-13 NOTE — Progress Notes (Signed)
PHYSICAL MEDICINE & REHABILITATION     PROGRESS NOTE  Subjective/Complaints:  Pt seen sitting up at edge of bed this AM working with SLP.  She slept well overnight.  She appears to be happy.   ROS: Denies CP, SOB, N/V/D.  Objective: Vital Signs: Blood pressure (!) 162/40, pulse 70, temperature 97.6 F (36.4 C), temperature source Oral, resp. rate 17, height 5\' 1"  (1.549 m), weight 84.5 kg (186 lb 3 oz), SpO2 98 %. Dg Swallowing Func-speech Pathology  Result Date: 10/11/2016 Objective Swallowing Evaluation: Type of Study: MBS-Modified Barium Swallow Study Patient Details Name: Susan Patel MRN: [id number] Date of Birth: 02/21/1941 Today's Date: 10/11/2016 Time: SLP Start Time (ACUTE ONLY): 1315-SLP Stop Time (ACUTE ONLY): 1333 SLP Time Calculation (min) (ACUTE ONLY): 18 min Past Medical History: Past Medical History: Diagnosis Date . Arthritis   'all over" . Asthma  . Chronic pain  . COPD (chronic obstructive pulmonary disease) (HCC)   O2 per  at nights  . Depression  . Diabetes mellitus without complication ([place])   borderline- , states she was on med., but MD told her "everything is under control so I threw the bottle away" . Fibromyalgia  . GERD (gastroesophageal reflux disease)  . Hypertension  . Neuromuscular disorder (HCC)   parkinson, neuropathy- both feet & hands. . Stroke [facility])   residual dysarthria Past Surgical History: Past Surgical History: Procedure Laterality Date . ABDOMINAL HYSTERECTOMY   . ANTERIOR CERVICAL DECOMP/DISCECTOMY FUSION N/A 07/10/2015  Procedure: Cervical five-six, Cervical six-seven anterior cervical decompression with fusion plating and bonegraft;  Surgeon: Jovita Gamma, MD;  Location: [facility];  Service: Neurosurgery;  Laterality: N/A; . APPENDECTOMY   . BIOPSY EYE MUSCLE  03/20/2016  biopsy vessel to right eye due to swelling . EYE SURGERY Bilateral   cataracts removed, /w "cyrstal lenses"  . SHOULDER ARTHROSCOPY Right   x2   RCR- spurs removed  HPI:  Susan Patel a 76 y.o.femalewith history of T2DM with neuropathy, HTN, fibromyalgia--chronic pain, COPD--oxygen at nights,right visual deficits with recent CVA (left internal capsule) 03/2016 and resultant mild pharyngeal phase dysphagia (MBS showed swallow delay to pyriform sinus with thin and nectar-thick liquids). Pt with CIR, OP therapy d/c from OP 05/31/16 on regular diet with thin liquids. Admitted 10/09/16, CT shows suspected acute right basal ganglia infarct, new left basal ganglia lacunar infarct, possibly subacute right occipital infarct. Subjective: alert, significantly dysarthric Assessment / Plan / Recommendation CHL IP CLINICAL IMPRESSIONS 10/11/2016 Clinical Impression Pt has a moderate, primarily oral dysphagia characterized by oral holding, lingual pumping, piecemeal swallows, and decreased bolus cohesion. Mod cues were provided throughout study for pt to initiate oral transit. She is not able to obtain liquids via straw and has mild-moderate anterior loss with cup sips. Nectar thick liquids are aspirated as they spill prematurely into her pharynx/larynx. She triggers a consistent cough response, which is protective enough to clear most of her aspirates but not all of them. Recommend Dys 1 diet and honey thick liquids by cup with meds crushed in puree. Suspect that consuming an entire meal tray may be a laborous task for her, so she will likely benefit from smaller, more frequent intake and rest breaks. SLP Visit Diagnosis Dysphagia, oral phase (R13.11) Attention and concentration deficit following -- Frontal lobe and executive function deficit following -- Impact on safety and function Moderate aspiration risk   CHL IP TREATMENT RECOMMENDATION 10/11/2016 Treatment Recommendations Therapy as outlined in treatment plan below   Prognosis 10/11/2016 Prognosis  for Safe Diet Advancement Good Barriers to Reach Goals Severity of deficits Barriers/Prognosis Comment -- CHL IP DIET RECOMMENDATION 10/11/2016  SLP Diet Recommendations Dysphagia 1 (Puree) solids;Honey thick liquids Liquid Administration via Cup;No straw Medication Administration Whole meds with puree Compensations Slow rate;Small sips/bites;Follow solids with liquid Postural Changes Remain semi-upright after after feeds/meals (Comment);Seated upright at 90 degrees   CHL IP OTHER RECOMMENDATIONS 10/11/2016 Recommended Consults -- Oral Care Recommendations Oral care BID Other Recommendations Order thickener from pharmacy;Prohibited food (jello, ice cream, thin soups);Remove water pitcher;Have oral suction available   CHL IP FOLLOW UP RECOMMENDATIONS 10/11/2016 Follow up Recommendations Inpatient Rehab   CHL IP FREQUENCY AND DURATION 10/11/2016 Speech Therapy Frequency (ACUTE ONLY) min 2x/week Treatment Duration 2 weeks      CHL IP ORAL PHASE 10/11/2016 Oral Phase Impaired Oral - Pudding Teaspoon -- Oral - Pudding Cup -- Oral - Honey Teaspoon -- Oral - Honey Cup Holding of bolus;Piecemeal swallowing;Lingual pumping;Decreased bolus cohesion;Premature spillage;Delayed oral transit;Lingual/palatal residue Oral - Nectar Teaspoon -- Oral - Nectar Cup Holding of bolus;Piecemeal swallowing;Lingual pumping;Decreased bolus cohesion;Premature spillage;Delayed oral transit;Lingual/palatal residue Oral - Nectar Straw -- Oral - Thin Teaspoon -- Oral - Thin Cup -- Oral - Thin Straw -- Oral - Puree Holding of bolus;Piecemeal swallowing;Lingual pumping;Decreased bolus cohesion;Premature spillage;Delayed oral transit;Lingual/palatal residue Oral - Mech Soft -- Oral - Regular -- Oral - Multi-Consistency -- Oral - Pill -- Oral Phase - Comment --  CHL IP PHARYNGEAL PHASE 10/11/2016 Pharyngeal Phase Impaired Pharyngeal- Pudding Teaspoon -- Pharyngeal -- Pharyngeal- Pudding Cup -- Pharyngeal -- Pharyngeal- Honey Teaspoon -- Pharyngeal -- Pharyngeal- Honey Cup WFL Pharyngeal -- Pharyngeal- Nectar Teaspoon -- Pharyngeal -- Pharyngeal- Nectar Cup Penetration/Aspiration before swallow  Pharyngeal Material enters airway, passes BELOW cords and not ejected out despite cough attempt by patient Pharyngeal- Nectar Straw -- Pharyngeal -- Pharyngeal- Thin Teaspoon -- Pharyngeal -- Pharyngeal- Thin Cup -- Pharyngeal -- Pharyngeal- Thin Straw -- Pharyngeal -- Pharyngeal- Puree WFL Pharyngeal -- Pharyngeal- Mechanical Soft -- Pharyngeal -- Pharyngeal- Regular -- Pharyngeal -- Pharyngeal- Multi-consistency -- Pharyngeal -- Pharyngeal- Pill -- Pharyngeal -- Pharyngeal Comment --  CHL IP CERVICAL ESOPHAGEAL PHASE 10/11/2016 Cervical Esophageal Phase WFL Pudding Teaspoon -- Pudding Cup -- Honey Teaspoon -- Honey Cup -- Nectar Teaspoon -- Nectar Cup -- Nectar Straw -- Thin Teaspoon -- Thin Cup -- Thin Straw -- Puree -- Mechanical Soft -- Regular -- Multi-consistency -- Pill -- Cervical Esophageal Comment -- CHL IP GO 05/31/2016 Functional Assessment Tool Used skilled observation  Functional Limitations Spoken language expressive Swallow Current Status [phone]) (None) Swallow Goal Status (K0254) (None) Swallow Discharge Status (Y7062) (None) Motor Speech Current Status (B7628) (None) Motor Speech Goal Status (B1517) (None) Motor Speech Goal Status (O1607) (None) Spoken Language Comprehension Current Status (P7106) (None) Spoken Language Comprehension Goal Status (Y6948) (None) Spoken Language Comprehension Discharge Status [phone] (None) Spoken Language Expression Current Status (O3500) (None) Spoken Language Expression Goal Status (X3818) CJ Spoken Language Expression Discharge Status (E9937) CI Attention Current Status (J6967) (None) Attention Goal Status (E9381) (None) Attention Discharge Status (O1751) (None) Memory Current Status (W2585) (None) Memory Goal Status (I7782) (None) Memory Discharge Status (U2353) (None) Voice Current Status (I1443) (None) Voice Goal Status (X5400) (None) Voice Discharge Status (Q6761) (None) Other Speech-Language Pathology Functional Limitation Current Status (P5093) (None) Other  Speech-Language Pathology Functional Limitation Goal Status (O6712) (None) Other Speech-Language Pathology Functional Limitation Discharge Status [phone]) (None) Germain Osgood 10/11/2016, 2:21 PM  Germain Osgood, M.A. CCC-SLP [phone]  Recent Labs  10/11/16 0437 10/13/16 0450  WBC 6.5 9.1  HGB 13.2 12.6  HCT 39.5 39.6  PLT 113* 313    Recent Labs  10/11/16 0437 10/13/16 0450  NA 140 142  K 4.7 3.2*  CL 106 108  GLUCOSE 89 110*  BUN 12 10  CREATININE 0.75 0.82  CALCIUM 9.0 9.0   CBG (last 3)   Recent Labs  10/12/16 0807 10/12/16 1138 10/12/16 1641  GLUCAP 120* 110* 91    Wt Readings from Last 3 Encounters:  10/12/16 84.5 kg (186 lb 3 oz)  10/10/16 88.1 kg (194 lb 4.8 oz)  09/15/16 87.6 kg (193 lb 3.2 oz)    Physical Exam:  BP (!) 162/40 (BP Location: Left Arm)   Pulse 70   Temp 97.6 F (36.4 C) (Oral)   Resp 17   Ht 5\' 1"  (1.549 m)   Wt 84.5 kg (186 lb 3 oz)   SpO2 98%   BMI 35.18 kg/m  Constitutional: She appears well-developed and well-nourished. NAD HENT: Normocephalic and atraumatic.  Eyes: EOMI. No discharge.  Cardiovascular: Normal rate and regular rhythm. No JVD. Respiratory: Effort normal and breath sounds normal.  GI: Soft. Bowel sounds are normal.   Musculoskeletal: She exhibits no edema or tenderness.  Neurological: She is alert and oriented. A cranial nerve deficit is present.  Dysarthria.  Right facial weakness.   Able to follow simple one and two step commands.  Motor  B/l UE 4+/5 prox to distal.  B/l LE: 4/5 prox to distal.  Mile LUE dysmetria Sensation intact   Skin: Skin is warm and dry.  Psychiatric: ?Inappropriate laughter.   Assessment/Plan: 1. Functional deficits secondary to right basal ganglia /parietal infarcts which require 3+ hours per day of interdisciplinary therapy in a comprehensive inpatient rehab setting. Physiatrist is providing close team supervision and 24 hour management of active medical  problems listed below. Physiatrist and rehab team continue to assess barriers to discharge/monitor patient progress toward functional and medical goals.  Function:  Bathing Bathing position      Bathing parts      Bathing assist        Upper Body Dressing/Undressing Upper body dressing                    Upper body assist        Lower Body Dressing/Undressing Lower body dressing                                  Lower body assist        Toileting Toileting          Toileting assist     Transfers Chair/bed transfer             Locomotion Ambulation           Wheelchair          Cognition Comprehension    Expression    Social Interaction    Problem Solving    Memory      Medical Problem List and Plan: 1.  Functional and swallowing deficits secondary to right basal ganglia /parietal infarcts on 5/26  Begin CIR  Notes reviewed, images reviewed 2.  DVT Prophylaxis/Anticoagulation: Pharmaceutical: Lovenox 3. Chronic pain/Pain Management: continue tramadol prn. Resume Neurontin as indicating increase pain in hands/feet.  4. Mood: LCSW to follow for evaluation and support.  5. Neuropsych: This patient is not  fully capable of making decisions on her own behalf. 6. Skin/Wound Care: routine pressure relief measures.  7. Fluids/Electrolytes/Nutrition: Monitor I/Os.  Encourage fluid intake during the day.    IVF, will d/c in future  Will continue to monitor for now.  8. T2DM: Hgb A1c- 6.3. Resumed amaryl and monitor BS ac/hs. Use SSI for elevated BS and titrated meds as needed for tighter control.   Monitor with increased mobility 9. COPD: Uses 3 L oxygen at nights.  10. Insomnia: continue trazodone prn.  11. Constipation: Resumed linzess.  12. Dyslipidemia : On Crestor.  13. COPD: Resumed Breo. Continue 3 L oxygen at nights.  14. HTN: Monitor BP bid  Permissive HTN for few more days.    Continue to hold losartan/HCTZ  Monitor  with increased activity 15. Dysphagia: On dysphagia 1, honey liquids by tsp.   Added IVF at nights to help maintain adequate hydration 16. Hypokalemia  K+ 3.2 on 5/30  Supplementing 17. Hypoalbuminemia  Supplement initiated 5/30  LOS (Days) 1 A FACE TO FACE EVALUATION WAS PERFORMED  Ankit Lorie Phenix 10/13/2016 8:49 AM

## 2016-10-13 NOTE — Progress Notes (Signed)
Social Work Assessment and Plan Social Work Assessment and Plan  Patient Details  Name: Susan Patel MRN: [id number] Date of Birth: 1940/07/24  Today's Date: 10/13/2016  Problem List:  Patient Active Problem List   Diagnosis Date Noted  . Dysmetria   . Diabetes mellitus type 2 in nonobese (HCC)   . Supplemental oxygen dependent   . Benign essential HTN   . Hypokalemia   . Hypoalbuminemia due to protein-calorie malnutrition ([place])   . Embolic stroke of right basal ganglia ([place]) 10/12/2016  . Oropharyngeal dysphagia   . Stroke ([place]) 10/09/2016  . Stroke (cerebrum) ([place]) 10/09/2016  . 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 Frequent falls 04/21/2016  . 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 Type 2 diabetes mellitus with peripheral neuropathy (HCC)   . Fibromyalgia   . Chronic pain syndrome   . Chronic obstructive pulmonary disease ([place])   . Gait disturbance, post-stroke   . Dysarthria, post-stroke   . Dysphagia, post-stroke   . Cerebrovascular accident (CVA) due to occlusion of basilar artery ([place])   . Mixed hyperlipidemia   . 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  . Allergic rhinitis 12/17/2015  . Primary osteoarthritis of both knees 11/19/2015  . Obesity (BMI 30-39.9) 09/04/2015  . Essential hypertension 09/04/2015  . Constipation 09/04/2015  . Asthma 08/26/2015  . Raynaud's phenomenon 08/26/2015  . Gastro-esophageal reflux disease with esophagitis 08/26/2015  . Spinal stenosis 08/26/2015  . Vitamin D deficiency 08/26/2015  . Diabetes mellitus, type 2 ([place]) 08/22/2015  . HNP (herniated nucleus pulposus), cervical 07/10/2015  . Cervical radicular pain 04/30/2013  . Knee osteoarthritis 01/05/2012   Past Medical History:  Past Medical History:  Diagnosis Date  . Arthritis    'all over"  . Asthma   . Chronic pain   . COPD (chronic obstructive pulmonary disease) (HCC)    O2 per [place] at nights   .  Depression   . Diabetes mellitus without complication ([place])    borderline- , states she was on med., but MD told her "everything is under control so I threw the bottle away"  . Fibromyalgia   . GERD (gastroesophageal reflux disease)   . Hypertension   . Neuromuscular disorder (HCC)    parkinson, neuropathy- both feet & hands.  . Stroke [facility])    residual dysarthria   Past Surgical History:  Past Surgical History:  Procedure Laterality Date  . ABDOMINAL HYSTERECTOMY    . ANTERIOR CERVICAL DECOMP/DISCECTOMY FUSION N/A 07/10/2015   Procedure: Cervical five-six, Cervical six-seven anterior cervical decompression with fusion plating and bonegraft;  Surgeon: Jovita Gamma, MD;  Location: [place];  Service: Neurosurgery;  Laterality: N/A;  . APPENDECTOMY    . BIOPSY EYE MUSCLE  03/20/2016   biopsy vessel to right eye due to swelling  . EYE SURGERY Bilateral    cataracts removed, /w "cyrstal lenses"   . SHOULDER ARTHROSCOPY Right    x2   RCR- spurs removed    Social History:  reports that she has never smoked. She has never used smokeless tobacco. She reports that she does not drink alcohol or use drugs.  Family / Support Systems Marital Status: Married Patient Roles: Spouse, Parent, Volunteer Spouse/Significant Other: [name] [phone] Children: Son and daughter Other Supports: [place] members Anticipated Caregiver: Husband Ability/Limitations of Caregiver: Getting someone to manage his rental properties and does preach on Sundays but she will go with him Caregiver Availability: 24/7  Family Dynamics: Close with family and freinds, numerous church members come up to visit her and provide support. Husband is here daily and participates in her therapies.  Social History Preferred language: English Religion: Pentecostal Cultural Background: No issues Education: High School Read: Yes Write: Yes Employment Status: Retired Freight forwarder  Issues: No issues Guardian/Conservator: None-according to MD pt is not fully capable of making her own decisions while here. Will look toward her husband if any decision needs to be made while here.   Abuse/Neglect Physical Abuse: Denies Verbal Abuse: Denies Sexual Abuse: Denies Exploitation of patient/patient's resources: Denies Self-Neglect: Denies  Emotional Status Pt's affect, behavior adn adjustment status: Pt did well after her last sttroke in november, it is somewhat disheartening having another one just after she had recovered from her first one. She wants to be independent again and hopefully will get there after this stay. She is glad to be back here for therapies. Recent Psychosocial Issues: recent stroke in 03/2016 and was here on CIR Pyschiatric History: Hx of depression takes medications for this and finds they help her. She at times becoems down but has a strong faith which pulls her through along with her supportive family and friends. May benefit from seeing neuro-psych while here Substance Abuse History: No issues  Patient / Family Perceptions, Expectations & Goals Pt/Family understanding of illness & functional limitations: Both pt and husband talk with the MD and feel they have a good understanding of her stroke and deficits. they are somewhat similar to her last one she had. Their main focus is on her recovery while here. Premorbid pt/family roles/activities: Wife, Mother, grandmother, church Nurse, mental health, etc Anticipated changes in roles/activities/participation: resume Pt/family expectations/goals: Pt states: " I want to do well here."  Husband states: " I will assist her like I have been doing, it is a pleasure."  [employer]: Other (Comment) Premorbid Home Care/DME Agencies: Other (Comment) (AP OP therapies-SP & PT) Transportation available at discharge: Husband Resource referrals recommended: Neuropsychology, Support group  (specify)  Discharge Planning Living Arrangements: Spouse/significant other Support Systems: Spouse/significant other, Children, Friends/neighbors, Church/faith community Type of Residence: Private residence Insurance Resources: Multimedia programmer (specify), Medicare ([place] Medicare) Financial Resources: Social Security Financial Screen Referred: No Living Expenses: Own Money Management: Spouse Does the patient have any problems obtaining your medications?: No Home Management: Both she and husband Patient/Family Preliminary Plans: Return home with husband who was assisting with her care after stroke in 03/2016. She had recovered and was doing well and her OP therapies had been discharged. If pt needs care husband will be there or have someone there with her. Will await team's evaluations and work on discharge needs. Social Work Anticipated Follow Up Needs: HH/OP, Support Group  Clinical Impression This couple is familiar to this worker since had them in 03/2016. Both very supportive of one another and pt is willing to do what she needs to do to recover from this stroke, although she is somewhat disheartened due to having another One so close to when she had recovered from her last one. Will work on discharge needs and make appropriate referrals. She went to OP last time at [facility]. Will ask team if would benefit from seeing neuro-psych while here.  Elease Hashimoto 10/13/2016, 9:14 AM

## 2016-10-13 NOTE — Evaluation (Signed)
Speech Language Pathology Assessment and Plan  Patient Details  Name: Susan Patel MRN: [id number] Date of Birth: 03/20/1941  SLP Diagnosis: Dysphagia;Dysarthria  Rehab Potential: Good ELOS: 5-7 days     Today's Date: 10/13/2016 SLP Individual Time: 8921-1941 SLP Individual Time Calculation (min): 60 min   Problem List:  Patient Active Problem List   Diagnosis Date Noted  . Dysmetria   . Diabetes mellitus type 2 in nonobese (HCC)   . Supplemental oxygen dependent   . Benign essential HTN   . Hypokalemia   . Hypoalbuminemia due to protein-calorie malnutrition ([place])   . Embolic stroke of right basal ganglia ([place]) 10/12/2016  . Oropharyngeal dysphagia   . Stroke ([place]) 10/09/2016  . Stroke (cerebrum) ([place]) 10/09/2016  . 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 Frequent falls 04/21/2016  . 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 Type 2 diabetes mellitus with peripheral neuropathy (HCC)   . Fibromyalgia   . Chronic pain syndrome   . Chronic obstructive pulmonary disease ([place])   . Gait disturbance, post-stroke   . Dysarthria, post-stroke   . Dysphagia, post-stroke   . 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 Mixed hyperlipidemia   . 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  . Allergic rhinitis 12/17/2015  . Primary osteoarthritis of both knees 11/19/2015  . Obesity (BMI 30-39.9) 09/04/2015  . Essential hypertension 09/04/2015  . Constipation 09/04/2015  . Asthma 08/26/2015  . Raynaud's phenomenon 08/26/2015  . Gastro-esophageal reflux disease with esophagitis 08/26/2015  . Spinal stenosis 08/26/2015  . Vitamin D deficiency 08/26/2015  . Diabetes mellitus, type 2 ([place]) 08/22/2015  . HNP (herniated nucleus pulposus), cervical 07/10/2015  . Cervical radicular pain 04/30/2013  . Knee osteoarthritis 01/05/2012   Past Medical History:  Past Medical History:  Diagnosis Date  .  Arthritis    'all over"  . Asthma   . Chronic pain   . COPD (chronic obstructive pulmonary disease) (HCC)    O2 per  at nights   . Depression   . Diabetes mellitus without complication ([place])    borderline- , states she was on med., but MD told her "everything is under control so I threw the bottle away"  . Fibromyalgia   . GERD (gastroesophageal reflux disease)   . Hypertension   . Neuromuscular disorder (HCC)    parkinson, neuropathy- both feet & hands.  . Stroke [facility])    residual dysarthria   Past Surgical History:  Past Surgical History:  Procedure Laterality Date  . ABDOMINAL HYSTERECTOMY    . ANTERIOR CERVICAL DECOMP/DISCECTOMY FUSION N/A 07/10/2015   Procedure: Cervical five-six, Cervical six-seven anterior cervical decompression with fusion plating and bonegraft;  Surgeon: Jovita Gamma, MD;  Location: [place] [facility];  Service: Neurosurgery;  Laterality: N/A;  . APPENDECTOMY    . BIOPSY EYE MUSCLE  03/20/2016   biopsy vessel to right eye due to swelling  . EYE SURGERY Bilateral    cataracts removed, /w "cyrstal lenses"   . SHOULDER ARTHROSCOPY Right    x2   RCR- spurs removed     Assessment / Plan / Recommendation Clinical Impression Patient is a 76 y.o. female COPD- oxygen dependent, T2DM, Neuropathy, chronic pain, CVA with residual visual deficits?, Parkinsonism; who was admitted on 10/09/16 with inability to speak, left sided weakness and difficulty walking. CT head done revealing suspicion of acute right basal ganglia infarct, subacute right occipital infarct, left basal  infarct question age and extensive small vessel disease. She receive tPA and continues on ASA for secondary stroke prevention.  MRI brain done revealing acute infarcts right basal ganglia anterior right parietal white matter with chronic right occipital cortical infarct. CTA head/neck showed severe progressive advanced intracranial atherosclerosis.  2 D echo revealed EF 60-65%, with grade one diastolic  dysfunction. MBS done yesterday and she was started on dysphagia 1 textures with honey-thick liquids. ASA changed to plavix due to progressive intracranial atherosclerosis  and stroke felt to be thrombotic due to small vessel disease.   Patient with severe dysarthria, poor safety as well as decrease in mobility and ability to carry out ADL tasks. CIR recommended by rehab team and patient admitted 10/12/16.  Upon arrival, patient was consuming her breakfast meal while sitting EOB with family present. Patient consumed Dsy. 1 textures and honey-thick liquids via cup and spoon without overt s/s of aspiration but demonstrated what appeared to be delayed swallow initiation and mild oral residue with all consistencies.  Patient was Mod I for use of small bites/sips but required Mod verbal cues for swallow initiation. Further trials were not attempted this session but will be assessed at next session. Recommend patient continue current diet with full supervision. Patient demonstrates moderate-severe dysarthria due to imprecise consonants secondary to decreased oral-motor ROM and weakness. Patient was ~50-75% intelligible at the phrase level with extra time and Mod-Max A verbal cues for use of speech intelligibility strategies. Patient's auditory comprehension, verbal expression, reading comprehension and written expression appeared [facility] for all tasks assessed at the basic level. Patient's cognitive function also appeared [facility] for all tasks assessed except for mildly impaired emergent and safety awareness.  Patient would benefit from skilled SLP intervention to maximize her speech intelligibility and swallowing function in order to maximize her overall functional independence and reduce caregiver burden prior to discharge.    Skilled Therapeutic Interventions          Administered a cognitive-linguistic evaluation and BSE. Please see above for details.   SLP Assessment  Patient will need skilled Speech Lanaguage Pathology  Services during CIR admission    Recommendations  SLP Diet Recommendations: Dysphagia 1 (Puree);Honey Liquid Administration via: Spoon Medication Administration: Crushed with puree Supervision: Patient able to self feed;Full supervision/cueing for compensatory strategies Compensations: Slow rate;Small sips/bites;Other (Comment) (may need verbal cues to initiate swallow ) Postural Changes and/or Swallow Maneuvers: Seated upright 90 degrees;Upright 30-60 min after meal Oral Care Recommendations: Oral care BID Patient destination: Home Follow up Recommendations: Outpatient SLP;24 hour supervision/assistance Equipment Recommended: To be determined    SLP Frequency 3 to 5 out of 7 days   SLP Duration  SLP Intensity  SLP Treatment/Interventions 5-7 days   Minumum of 1-2 x/day, 30 to 90 minutes  Cueing hierarchy;Therapeutic Activities;Functional tasks;Patient/family education;Internal/external aids;Dysphagia/aspiration precaution training;Speech/Language facilitation;Environmental controls    Pain Pain Assessment Pain Assessment: No/denies pain   Prior Functioning Type of Home: House  Lives With: Spouse Available Help at Discharge: Available 24 hours/day;Family Vocation: Retired  Function:  Eating Eating   Modified Consistency Diet: Yes Eating Assist Level: More than reasonable amount of time;Supervision or verbal cues           Cognition Comprehension Comprehension assist level: Follows basic conversation/direction with extra time/assistive device  Expression   Expression assist level: Expresses basic 50 - 74% of the time/requires cueing 25 - 49% of the time. Needs to repeat parts of sentences.  Social Interaction Social Interaction assist level: Interacts appropriately with others  with medication or extra time (anti-anxiety, antidepressant).  Problem Solving Problem solving assist level: Solves basic 90% of the time/requires cueing < 10% of the time  Memory Memory  assist level: Recognizes or recalls 90% of the time/requires cueing < 10% of the time   Short Term Goals: Week 1: SLP Short Term Goal 1 (Week 1): STGs=LTGs  Refer to Care Plan for Long Term Goals  Recommendations for other services: None   Discharge Criteria: Patient will be discharged from SLP if patient refuses treatment 3 consecutive times without medical reason, if treatment goals not met, if there is a change in medical status, if patient makes no progress towards goals or if patient is discharged from hospital.  The above assessment, treatment plan, treatment alternatives and goals were discussed and mutually agreed upon: by patient and by family  Haniyyah Sakuma 10/13/2016, 11:55 AM

## 2016-10-13 NOTE — Evaluation (Signed)
Occupational Therapy Assessment and Plan  Patient Details  Name: Susan Patel MRN: [id number] Date of Birth: 04-23-1941  OT Diagnosis: cognitive deficits, hemiplegia affecting non-dominant side and muscle weakness (generalized) Rehab Potential: Rehab Potential (ACUTE ONLY): Good ELOS: 5-7 days   Today's Date: 10/13/2016 OT Individual Time: 1030-1200 OT Individual Time Calculation (min): 90 min     Problem List:  Patient Active Problem List   Diagnosis Date Noted  . Dysmetria   . Diabetes mellitus type 2 in nonobese (HCC)   . Supplemental oxygen dependent   . Benign essential HTN   . Hypokalemia   . Hypoalbuminemia due to protein-calorie malnutrition ([place])   . Embolic stroke of right basal ganglia ([place]) 10/12/2016  . Oropharyngeal dysphagia   . Stroke ([place]) 10/09/2016  . Stroke (cerebrum) ([place]) 10/09/2016  . 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 Frequent falls 04/21/2016  . 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 Type 2 diabetes mellitus with peripheral neuropathy (HCC)   . Fibromyalgia   . Chronic pain syndrome   . Chronic obstructive pulmonary disease ([place])   . Gait disturbance, post-stroke   . Dysarthria, post-stroke   . Dysphagia, post-stroke   . 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 Mixed hyperlipidemia   . 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  . Allergic rhinitis 12/17/2015  . Primary osteoarthritis of both knees 11/19/2015  . Obesity (BMI 30-39.9) 09/04/2015  . Essential hypertension 09/04/2015  . Constipation 09/04/2015  . Asthma 08/26/2015  . Raynaud's phenomenon 08/26/2015  . Gastro-esophageal reflux disease with esophagitis 08/26/2015  . Spinal stenosis 08/26/2015  . Vitamin D deficiency 08/26/2015  . Diabetes mellitus, type 2 ([place]) 08/22/2015  . HNP (herniated nucleus pulposus), cervical 07/10/2015  . Cervical radicular pain 04/30/2013  . Knee  osteoarthritis 01/05/2012    Past Medical History:  Past Medical History:  Diagnosis Date  . Arthritis    'all over"  . Asthma   . Chronic pain   . COPD (chronic obstructive pulmonary disease) (HCC)    O2 per [place] at nights   . Depression   . Diabetes mellitus without complication ([place])    borderline- , states she was on med., but MD told her "everything is under control so I threw the bottle away"  . Fibromyalgia   . GERD (gastroesophageal reflux disease)   . Hypertension   . Neuromuscular disorder (HCC)    parkinson, neuropathy- both feet & hands.  . Stroke [facility])    residual dysarthria   Past Surgical History:  Past Surgical History:  Procedure Laterality Date  . ABDOMINAL HYSTERECTOMY    . ANTERIOR CERVICAL DECOMP/DISCECTOMY FUSION N/A 07/10/2015   Procedure: Cervical five-six, Cervical six-seven anterior cervical decompression with fusion plating and bonegraft;  Surgeon: Jovita Gamma, MD;  Location: [place] [facility]UR[facility];  Service: Neurosurgery;  Laterality: N/A;  . APPENDECTOMY    . BIOPSY EYE MUSCLE  03/20/2016   biopsy vessel to right eye due to swelling  . EYE SURGERY Bilateral    cataracts removed, /w "cyrstal lenses"   . SHOULDER ARTHROSCOPY Right    x2   RCR- spurs removed     Assessment & Plan Clinical Impression: Patient is a 76 y.o. year old female with recent admission to the hospital on 10/09/16 with inability to speak, left sided weakness and difficulty walking. CT head done revealing suspicion of acute right basal ganglia infarct, subacute right occipital infarct,  left basal infarct question age and extensive small vessel disease. She receive tPA and continues on ASA for secondary stroke prevention. MRI brain done revealing acute infarcts right basal ganglia anteriorright parietal white matter with chronic right occipital cortical infarct.   Patient transferred to CIR on 10/12/2016 .    Patient currently requires min with basic self-care skills secondary to muscle  weakness, impaired timing and sequencing, decreased awareness, decreased problem solving, decreased safety awareness, decreased memory and delayed processing and decreased standing balance.  Prior to hospitalization, patient could complete ADLs with supervision.  Patient will benefit from skilled intervention to decrease level of assist with basic self-care skills and increase independence with basic self-care skills prior to discharge home with care partner.  Anticipate patient will require 24 hour supervision and follow up outpatient.  OT - End of Session Activity Tolerance: Tolerates 30+ min activity with multiple rests Endurance Deficit: Yes OT Assessment Rehab Potential (ACUTE ONLY): Good OT Patient demonstrates impairments in the following area(s): Balance;Cognition;Motor;Safety OT Basic ADL's Functional Problem(s): Grooming;Bathing;Dressing;Toileting OT Advanced ADL's Functional Problem(s): Simple Meal Preparation;Light Housekeeping OT Transfers Functional Problem(s): Toilet;Tub/Shower OT Additional Impairment(s): Fuctional Use of Upper Extremity OT Plan OT Intensity: Minimum of 1-2 x/day, 45 to 90 minutes OT Frequency: 5 out of 7 days OT Duration/Estimated Length of Stay: 5-7 days OT Treatment/Interventions: Balance/vestibular training;DME/adaptive equipment instruction;Cognitive remediation/compensation;Discharge planning;Functional mobility training;Patient/family education;Neuromuscular re-education;UE/LE Strength taining/ROM;Therapeutic Exercise;Therapeutic Activities;Self Care/advanced ADL retraining;UE/LE Coordination activities;Visual/perceptual remediation/compensation OT Self Feeding Anticipated Outcome(s): modified independent OT Basic Self-Care Anticipated Outcome(s): modified independent to supervision OT Toileting Anticipated Outcome(s): modified independent OT Bathroom Transfers Anticipated Outcome(s): modified independent to supervision OT Recommendation Patient  destination: Home Follow Up Recommendations: 24 hour supervision/assistance Equipment Recommended: None recommended by OT   Skilled Therapeutic Intervention Began working on balance, cognition, and selfcare independence during session.  Pt min assist for sit to stand from surfaces without use of UEs.  Able to ambulate with min guard assist overall with noted pt coming closer to the right side of the hallway and close to people as well.  Utilized Dynavision in standing for 3 trials of 1 min.  Pt with general decreased speed in the lower left and right quadrants but also slower in general throughout.  She tripped over one of the legs on the Dynavision as well when ambulating away from it on the left side, requiring min assist to correct her balance.  Had her work on 9 hole peg test in the gym with LUE completed in 71 seconds, while on the right it took her 50 seconds.  Also had her walking without assistive device and reaching down to the floor to pick up items from the floor based on numerical order.  She was able to complete this with increased time and supervision.  Returned to room at end of session.  Checked pt's spouse off on assisting her to and from the bathroom with hand held assist.  Pt left in bed at end of session with call button in reach and bed alarm in place.   OT Evaluation Precautions/Restrictions  Precautions Precautions: Fall Precaution Comments: full supervision for eating, puree diet, honey thick liquids Restrictions Weight Bearing Restrictions: No Vital Signs Therapy Vitals Pulse Rate: 80 BP: (!) 160/74 Patient Position (if appropriate): Sitting Oxygen Therapy SpO2: 97 % O2 Device: Not Delivered Pulse Oximetry Type: Intermittent Pain Pain Assessment Pain Assessment: No/denies pain Home Living/Prior Functioning Home Living Living Arrangements: Spouse/significant other Available Help at Discharge: Available 24 hours/day, Family Type of Home:  House Home Access:  Stairs to enter, Ramped entrance Technical brewer of Steps: 4 Entrance Stairs-Rails: Right, Left, Can reach both Home Layout: Two level, Able to live on main level with bedroom/bathroom Alternate Level Stairs-Number of Steps:  (uses lift chair to access 2nd level PTA) Bathroom Shower/Tub: Other (comment) (walk-in tub) Bathroom Toilet: Standard Bathroom Accessibility: Yes  Lives With: Spouse IADL History Homemaking Responsibilities: Yes Meal Prep Responsibility: Secondary Cleaning Responsibility: Secondary (Has hired assistance to help cleaning but she straightens up some) Current License: Yes (spouse does the driving) Occupation: Retired Prior Function Level of Independence: Needs assistance with ADLs Dressing: Minimal (husband assisted with donning shirt over head and fastening her bra)  Able to Take Stairs?: Yes Driving: Yes Vocation: Retired Leisure: Hobbies-yes (Comment) Comments: reading, playing piano ADL  See Function Section of chart for details  Vision Baseline Vision/History: Wears glasses Wears Glasses: Reading only Patient Visual Report: No change from baseline Vision Assessment?: Vision impaired- to be further tested in functional context Eye Alignment: Within Functional Limits Ocular Range of Motion: Within Functional Limits Alignment/Gaze Preference: Within Defined Limits Tracking/Visual Pursuits: Decreased smoothness of horizontal tracking;Decreased smoothness of vertical tracking;Other (comment) (Pt with lost fixation at times when tracking to the right.) Saccades: Within functional limits Convergence: Impaired (comment) Visual Fields: Impaired-to be further tested in functional context Perception   WFLs during testing but will continue to assess further during treatment.  Praxis Praxis: Intact Cognition Overall Cognitive Status: History of cognitive impairments - at baseline Arousal/Alertness: Awake/alert Orientation Level:  Person;Place;Situation Person: Oriented Place: Oriented Situation: Oriented Year: 2018 Month: May Day of Week: Correct Memory: Appears intact Immediate Memory Recall: Sock;Blue;Bed Memory Recall: Sock;Blue Memory Recall Sock: Without Cue Memory Recall Blue: Without Cue Attention: Sustained;Selective Sustained Attention: Appears intact Sustained Attention Impairment: Verbal basic;Functional basic Selective Attention: Impaired Selective Attention Impairment: Functional complex Awareness: Impaired Awareness Impairment: Intellectual impairment (Pt needed cueing to report visual deficits and balance deficits, initially only stated speech difficulties) Problem Solving: Appears intact Safety/Judgment: Impaired (Pt reported being up at times without assistance, but she was able to correctly identify the procedures for getting assistance when needed for toileting, etc. ) Sensation Sensation Light Touch: Appears Intact (history of numbness in fingers and toes secondary to neuropathy) Stereognosis: Appears Intact Hot/Cold: Appears Intact Proprioception: Appears Intact Coordination Gross Motor Movements are Fluid and Coordinated: Yes Fine Motor Movements are Fluid and Coordinated: Yes Motor  Motor Motor: Hemiplegia Mobility  Bed Mobility Bed Mobility: Rolling Right;Rolling Left;Supine to Sit;Sit to Supine Rolling Right: 5: Supervision (max verbal cuing for technique) Rolling Left: 5: Supervision (max verbal cuing for technique) Supine to Sit: 5: Supervision Sit to Supine: 5: Supervision Transfers Transfers: Sit to Stand;Stand to Sit Sit to Stand: 4: Min assist;Without upper extremity assist;From toilet Stand to Sit: 4: Min assist;To toilet;Without upper extremity assist  Trunk/Postural Assessment  Cervical Assessment Cervical Assessment: Within Functional Limits Thoracic Assessment Thoracic Assessment: Exceptions to [facility] (slight thoracic kyphosis) Lumbar Assessment Lumbar  Assessment: Within Functional Limits Postural Control Postural Control: Within Functional Limits  Balance Balance Balance Assessed: Yes Standardized Balance Assessment Standardized Balance Assessment: Berg Balance Test Berg Balance Test Sit to Stand: Able to stand without using hands and stabilize independently Standing Unsupported: Able to stand safely 2 minutes Sitting with Back Unsupported but Feet Supported on Floor or Stool: Able to sit safely and securely 2 minutes Stand to Sit: Sits safely with minimal use of hands Transfers: Able to transfer with verbal cueing and /or supervision Standing Unsupported with  Eyes Closed: Able to stand 10 seconds with supervision Standing Ubsupported with Feet Together: Able to place feet together independently and stand for 1 minute with supervision From Standing, Reach Forward with Outstretched Arm: Can reach forward >5 cm safely (2") From Standing Position, Pick up Object from Floor: Able to pick up shoe, needs supervision From Standing Position, Turn to Look Behind Over each Shoulder: Turn sideways only but maintains balance Turn 360 Degrees: Able to turn 360 degrees safely in 4 seconds or less Standing Unsupported, Alternately Place Feet on Step/Stool: Needs assistance to keep from falling or unable to try Standing Unsupported, One Foot in Front: Able to take small step independently and hold 30 seconds Standing on One Leg: Unable to try or needs assist to prevent fall Total Score: 37 Static Sitting Balance Static Sitting - Balance Support: No upper extremity supported Static Sitting - Level of Assistance: 7: Independent Dynamic Sitting Balance Dynamic Sitting - Balance Support: During functional activity Dynamic Sitting - Level of Assistance: 7: Independent Static Standing Balance Static Standing - Balance Support: No upper extremity supported;During functional activity Static Standing - Level of Assistance: 5: Stand by assistance Dynamic  Standing Balance Dynamic Standing - Balance Support: During functional activity;No upper extremity supported Dynamic Standing - Level of Assistance: 4: Min assist Extremity/Trunk Assessment RUE Assessment RUE Assessment: Exceptions to [facility] (AROM shoulder flexion 0-120 degrees, all other joints AROM WFLS , strength 4/5 throughout) LUE Assessment LUE Assessment: Exceptions to [facility] (AROM for shoulder flexion 0-120 degrees, all other joints AROM WFLs.  Strength 4/5 throughout.  Slightly slower coordination compared to the right with FM manipulation. )   See Function Navigator for Current Functional Status.   Refer to Care Plan for Long Term Goals  Recommendations for other services: None    Discharge Criteria: Patient will be discharged from OT if patient refuses treatment 3 consecutive times without medical reason, if treatment goals not met, if there is a change in medical status, if patient makes no progress towards goals or if patient is discharged from hospital.  The above assessment, treatment plan, treatment alternatives and goals were discussed and mutually agreed upon: by patient  Sharnee Douglass OTR/L  10/13/2016, 12:49 PM

## 2016-10-13 NOTE — Progress Notes (Signed)
Called to room by spouse, patient attempted to get OOB , IV dislodges, removed, IV site unremarkable to RFA if signs of infiltration or infection., pressure dressing applied, IVT to be notified for restart.

## 2016-10-13 NOTE — Care Management Note (Signed)
[facility] Individual Statement of Services  Patient Name:  Susan Patel  Date:  10/13/2016  Welcome to the [facility].  Our goal is to provide you with an individualized program based on your diagnosis and situation, designed to meet your specific needs.  With this comprehensive rehabilitation program, you will be expected to participate in at least 3 hours of rehabilitation therapies Monday-Friday, with modified therapy programming on the weekends.  Your rehabilitation program will include the following services:  Physical Therapy (PT), Occupational Therapy (OT), Speech Therapy (ST), 24 hour per day rehabilitation nursing, Case Management (Social Worker), Rehabilitation Medicine, Nutrition Services and Pharmacy Services  Weekly team conferences will be held on Wednesday to discuss your progress.  Your Social Worker will talk with you frequently to get your input and to update you on team discussions.  Team conferences with you and your family in attendance may also be held.  Expected length of stay: 5-7 days  Overall anticipated outcome: mod/i level  Depending on your progress and recovery, your program may change. Your Social Worker will coordinate services and will keep you informed of any changes. Your Social Worker's name and contact numbers are listed  below.  The following services may also be recommended but are not provided by the [facility]:   [place] will be made to provide these services after discharge if needed.  Arrangements include referral to agencies that provide these services.  Your insurance has been verified to be:  [place] Your primary doctor is:  Melvyn Novas  Pertinent information will be shared with your doctor and your insurance company.  Social Worker:  Ovidio Kin, [place] or ([phone]  Information discussed with and copy given to patient by: Elease Hashimoto, 10/13/2016, 2:04 PM

## 2016-10-13 NOTE — IPOC Note (Signed)
Overall Plan of Care [facility]) Patient Details Name: Susan Patel MRN: [id number] DOB: [birth year]  Admitting Diagnosis: R CVA  Hospital Problems: Active Problems:   Embolic stroke of right basal ganglia (HCC)   Oropharyngeal dysphagia   Dysmetria   Diabetes mellitus type 2 in nonobese ([place])   Supplemental oxygen dependent   Benign essential HTN   Hypokalemia   Hypoalbuminemia due to protein-calorie malnutrition ([place])     Functional Problem List: Nursing Endurance, Medication Management, Nutrition, Other (comment), Pain  PT Balance, Behavior, Endurance, Motor, Safety, Sensory, Perception  OT Balance, Cognition, Motor, Safety  SLP Cognition, Nutrition  TR         Basic ADL's: OT Grooming, Bathing, Dressing, Toileting     Advanced  ADL's: OT Simple Meal Preparation, Light Housekeeping     Transfers: PT Bed Mobility, Bed to Chair, Car, [employer], Futures trader, Tub/Shower     Locomotion: PT Ambulation, Stairs     Additional Impairments: OT Fuctional Use of Upper Extremity  SLP Swallowing, Communication expression    TR      Anticipated Outcomes Item Anticipated Outcome  Self Feeding modified independent  Swallowing  Min A   Basic self-care  modified independent to supervision  Toileting  modified independent   Bathroom Transfers modified independent to supervision  Bowel/Bladder  To be continent of bowel and bladder  Transfers  mod I with LRAD  Locomotion  mod I with LRAD  Communication  Mod A  Cognition     Pain  0  Safety/Judgment  to remain fall free   Therapy Plan: PT Intensity: Minimum of 1-2 x/day ,45 to 90 minutes PT Frequency: 5 out of 7 days PT Duration Estimated Length of Stay: 1 week OT Intensity: Minimum of 1-2 x/day, 45 to 90 minutes OT Frequency: 5 out of 7 days OT Duration/Estimated Length of Stay: 5-7 days SLP Intensity: Minumum of 1-2 x/day, 30 to 90 minutes SLP Frequency: 3 to 5 out of 7 days SLP Duration/Estimated  Length of Stay: 5-7 days        Team Interventions: Nursing Interventions Patient/Family Education, Bladder Management, Bowel Management, Disease Management/Prevention, Pain Management, Medication Management, Dysphagia/Aspiration Precaution Training, Discharge Planning  PT interventions Community reintegration, Ambulation/gait training, DME/adaptive equipment instruction, Neuromuscular re-education, Psychosocial support, Stair training, UE/LE Strength taining/ROM, UE/LE Coordination activities, Therapeutic Activities, Pain management, Training and development officer, Discharge planning, Cognitive remediation/compensation, Disease management/prevention, Functional mobility training, Patient/family education, Therapeutic Exercise, Visual/perceptual remediation/compensation  OT Interventions Balance/vestibular training, DME/adaptive equipment instruction, Cognitive remediation/compensation, Discharge planning, Functional mobility training, Patient/family education, Neuromuscular re-education, UE/LE Strength taining/ROM, Therapeutic Exercise, Therapeutic Activities, Self Care/advanced ADL retraining, UE/LE Coordination activities, Visual/perceptual remediation/compensation  SLP Interventions Cueing hierarchy, Therapeutic Activities, Functional tasks, Patient/family education, Internal/external aids, Dysphagia/aspiration precaution training, Speech/Language facilitation, Environmental controls  TR Interventions    SW/CM Interventions Discharge Planning, Psychosocial Support, Patient/Family Education    Team Discharge Planning: Destination: PT-Home ,OT- Home , SLP-Home Projected Follow-up: PT-Outpatient PT, OT-  24 hour supervision/assistance, SLP-Outpatient SLP, 24 hour supervision/assistance Projected Equipment Needs: PT-To be determined, OT- None recommended by OT, SLP-To be determined Equipment Details: PT- , OT-  Patient/family involved in discharge planning: PT- Patient,  OT-Patient, Family  member/caregiver, SLP-Patient, Family member/caregiver  MD ELOS: 5-7 days. Medical Rehab Prognosis:  Good Assessment: 76 y.o. female COPD- oxygen dependent, T2DM with neuropathy, chronic pain, CVA with residual visual deficits?, Parkinsonism; who was admitted on 10/09/16 with inability to speak, left sided weakness and difficulty walking. CT head done revealing suspicion of acute  right basal ganglia infarct, subacute right occipital infarct, left basal infarct question age and extensive small vessel disease. She received tPA and continues on ASA for secondary stroke prevention.  MRI brain done revealing acute infarcts right basal ganglia anterior right parietal white matter with chronic right occipital cortical infarct. CTA head/neck showed severe progressive advanced intracranial atherosclerosis.  2 D echo revealed EF 60-65%, with grade one diastolic dysfunction. MBS and she was started on dysphagia 1, honey liquids. ASA changed to plavix due to progressive intracranial atherosclerosis and stroke felt to be thrombotic due to small vessel disease. Repeat CT head without acute changes. Patient with severe dysarthria, severe expressive deficits, poor safety as well as decrease in mobility and ability to carry out ADL tasks. Will set goals for Mod I with PT/OT and Mod A with communication with SLP.  See Team Conference Notes for weekly updates to the plan of care

## 2016-10-14 ENCOUNTER — Inpatient Hospital Stay (HOSPITAL_COMMUNITY): Payer: Medicare Other | Admitting: Speech Pathology

## 2016-10-14 ENCOUNTER — Inpatient Hospital Stay (HOSPITAL_COMMUNITY): Payer: Medicare Other | Admitting: Occupational Therapy

## 2016-10-14 ENCOUNTER — Inpatient Hospital Stay (HOSPITAL_COMMUNITY): Payer: Medicare Other | Admitting: Physical Therapy

## 2016-10-14 LAB — GLUCOSE, CAPILLARY
GLUCOSE-CAPILLARY: 89 mg/dL (ref 65–99)
GLUCOSE-CAPILLARY: 83 mg/dL (ref 65–99)
Glucose-Capillary: 97 mg/dL (ref 65–99)
Glucose-Capillary: 58 mg/dL — ABNORMAL LOW (ref 65–99)
Glucose-Capillary: 94 mg/dL (ref 65–99)

## 2016-10-14 MED ORDER — GLUCOSE 40 % PO GEL
ORAL | Status: AC
  Administered 2016-10-14: 37.5 g
  Filled 2016-10-14: qty 1

## 2016-10-14 MED ORDER — GLIMEPIRIDE 1 MG PO TABS
1.0000 mg | ORAL_TABLET | Freq: Every day | ORAL | Status: DC
  Administered 2016-10-15: 1 mg via ORAL
  Filled 2016-10-14: qty 1

## 2016-10-14 NOTE — Progress Notes (Signed)
Patient information reviewed and entered into eRehab system by Aaminah Forrester, RN, CRRN, PPS Coordinator.  Information including medical coding and functional independence measure will be reviewed and updated through discharge.     Per nursing patient was given "Data Collection Information Summary for Patients in Inpatient Rehabilitation Facilities with attached "Privacy Act Statement-Health Care Records" upon admission.  

## 2016-10-14 NOTE — Progress Notes (Signed)
Occupational Therapy Session Note  Patient Details  Name: Susan Patel MRN: [id number] Date of Birth: 21-Sep-1940  Today's Date: 10/14/2016 OT Individual Time: 5284-1324 OT Individual Time Calculation (min): 42 min    Short Term Goals: Week 1:  OT Short Term Goal 1 (Week 1): STGs equal to LTGs based on ELOS set at modified independent to supervision.   Skilled Therapeutic Interventions/Progress Updates:    Pt completed supine to sit EOB with min assist to start session.  She was able to complete donning slip on shoes with min assist.  Pt then ambulated to the ADL apartment with min guard assist.  She was able to complete working on making up the full size bed without use of an assistive device.  Mod instructional cueing for orientation of mattress fitted sheet and regular sheet.  She was able to complete donning pillow cases on pillows in standing but when reaching to the floor to pick up the pillow that had fallen, she demonstrated LOB forward, requiring min assist. Transitioned to working on supine to sit on the regular bed.  Min guard assist to complete task.  Returned to the room with supervision to complete session with transfer to the bed with supervision.  Pt left in bed with spouse present.    Therapy Documentation Precautions:  Precautions Precautions: Fall Precaution Comments: full supervision for eating, puree diet, honey thick liquids Restrictions Weight Bearing Restrictions: No  Pain: Pain Assessment Pain Assessment: No/denies pain ADL: See Function Navigator for Current Functional Status.   Therapy/Group: Individual Therapy  Cinch Ormond OTR/L 10/14/2016, 3:43 PM

## 2016-10-14 NOTE — Progress Notes (Signed)
Speech Language Pathology Daily Session Note  Patient Details  Name: Susan Patel MRN: [id number] Date of Birth: Jun 29, 1940  Today's Date: 10/14/2016 SLP Individual Time: 1015-1100 SLP Individual Time Calculation (min): 45 min  Short Term Goals: Week 1: SLP Short Term Goal 1 (Week 1): STGs=LTGs  Skilled Therapeutic Interventions: Skilled treatment session focused on speech and dysphagia goals. Patient participated in a verbal description task at the word and phrase level and was ~50% intelligible despite Max A verbal and visual cues. Patient with minimal lingual movement during verbal expression and sitting with her oral cavity open at rest that required Max A verbal and visual cues with use of mirror for emergent awareness. However, patient demonstrated increased lingual elevation when producing single words with /l/ in the initial position (~50% of time). Patient also consumed trials of nectar-thick liquids via tsp with Max A multimodal cues and more than a reasonable amount of time for swallow initiation without overt s/s of aspiration. Recommend patient continue current diet. Patient left sitting EOB with husband present. Continue with current plan of care.      Function:  Eating Eating   Modified Consistency Diet: Yes Eating Assist Level: More than reasonable amount of time;Supervision or verbal cues           Cognition Comprehension Comprehension assist level: Follows basic conversation/direction with extra time/assistive device  Expression   Expression assist level: Expresses basic 25 - 49% of the time/requires cueing 50 - 75% of the time. Uses single words/gestures.  Social Interaction Social Interaction assist level: Interacts appropriately with others - No medications needed.  Problem Solving Problem solving assist level: Solves basic problems with no assist  Memory Memory assist level: Recognizes or recalls 90% of the time/requires cueing < 10% of the time    Pain No  reports of pain   Therapy/Group: Individual Therapy  Teena Mangus 10/14/2016, 11:43 AM

## 2016-10-14 NOTE — Progress Notes (Signed)
Physical Therapy Session Note  Patient Details  Name: Susan Patel MRN: [id number] Date of Birth: July 14, 1940  Today's Date: 10/14/2016 PT Individual Time: 1430-1530 PT Individual Time Calculation (min): 60 min   Short Term Goals: Week 1:  PT Short Term Goal 1 (Week 1): STG = LTG due to short ELOS.  Skilled Therapeutic Interventions/Progress Updates:    Pt up in room upon arrival standing at sink (with spouse in chair across room).Marland Kitchen Spouse reports that nursing is allowing pt to be up with his supervision (confirmed with nursing). Discussed need for proper guarding if pt going to be up with spouse. Pt/spouse in agreement. Discussed with nursing after session. Transfers: cues for controlling descent when sitting, using LEs to vault up from chair inconsistently. Reviewing sit<>stand technique. Performed from variable surfaces and heights. Assist needed from lower surface. Ambulation: 210 ft X1, 150 ft X1, 110 ft without device and close supervision. Balance: standing toe taps with 4 inch step, adding target to midline, staggered stance static balance with progression to staggered stance with looking bilaterally. Cone taps for increased control, floor targets including forward and across midline. Physical assist needed to maintain balance and repeated cues for task. Pt distracted by other activities in gym. Nu-step L3 X10 min. Pt returned to room after session with all needs in reach. Sitting EOB in nursing care and spouse present. All needs in reach.   Therapy Documentation Precautions:  Precautions Precautions: Fall Precaution Comments: full supervision for eating, puree diet, honey thick liquids Restrictions Weight Bearing Restrictions: No   Pain: Pain Assessment Pain Assessment: No/denies pain   See Function Navigator for Current Functional Status.   Therapy/Group: Individual Therapy  Linard Millers, PT  10/14/2016, 2:49 PM

## 2016-10-14 NOTE — Progress Notes (Signed)
Occupational Therapy Session Note  Patient Details  Name: Susan Patel MRN: [id number] Date of Birth: Aug 30, 1940  Today's Date: 10/14/2016 OT Individual Time: 5670-1410 OT Individual Time Calculation (min): 60 min    Short Term Goals: Week 1:  OT Short Term Goal 1 (Week 1): STGs equal to LTGs based on ELOS set at modified independent to supervision.   Skilled Therapeutic Interventions/Progress Updates:    Pt received in room sitting in arm chair at sink fully dressed putting on her makeup.  Pt's spouse stated that he provided the supervision this morning to ambulate her to the bathroom and during her shower. "I am used to doing that for her".   Pt ambulated with light contact guard to the gym and sat in arm chair to work on LUE [facility] with lacing small beads.   She then worked on Dietitian with the [employer].   Used stabilization mode for her to maintain her center of gravity. Pt was able to do this well on the most challenging setting.  For weight shifting, she needed manual guidance at her hips to shift fully side to side.  This may have been due to delayed processing with following the target.    She then worked on a Paediatric nurse with extra time needed.  Worked on naming objects working on Chief Strategy Officer.  Pt correctly named all objects, but had great difficulty verbalizing words with "t" , "p", "n".  Pt then ambulated with light contact guard to the speech therapy room for her next session.  Therapy Documentation Precautions:  Precautions Precautions: Fall Precaution Comments: full supervision for eating, puree diet, honey thick liquids Restrictions Weight Bearing Restrictions: No  Pain: Pain Assessment Pain Assessment: No/denies pain  ADL:  See Function Navigator for Current Functional Status.   Therapy/Group: Individual Therapy  [place] 10/14/2016, 12:20 PM

## 2016-10-14 NOTE — Progress Notes (Signed)
Hypoglycemic Event  CBG: 58 Treatment: 15 GM gel  Symptoms: weak, disoriented  Follow-up CBG: Time:1155 CBG Result:89  Possible Reasons for Event: Unknown  Comments/MD notified:P. Love. PA   Milus Mallick

## 2016-10-14 NOTE — Progress Notes (Signed)
[facility] PHYSICAL MEDICINE & REHABILITATION     PROGRESS NOTE  Subjective/Complaints:  Pt seen laying in bed this AM.  Husband at bedside.  Pt slept well overnight.  She had a good first day in therapies.   ROS: Denies CP, SOB, N/V/D.  Objective: Vital Signs: Blood pressure (!) 125/40, pulse 66, temperature 98.2 F (36.8 C), temperature source Oral, resp. rate 16, height 5\' 1"  (1.549 m), weight 84.5 kg (186 lb 3 oz), SpO2 97 %. No results found.  Recent Labs  10/13/16 0450  WBC 9.1  HGB 12.6  HCT 39.6  PLT 313    Recent Labs  10/13/16 0450  NA 142  K 3.2*  CL 108  GLUCOSE 110*  BUN 10  CREATININE 0.82  CALCIUM 9.0   CBG (last 3)   Recent Labs  10/13/16 1645 10/13/16 2040 10/14/16 0647  GLUCAP 74 117* 97    Wt Readings from Last 3 Encounters:  10/12/16 84.5 kg (186 lb 3 oz)  10/10/16 88.1 kg (194 lb 4.8 oz)  09/15/16 87.6 kg (193 lb 3.2 oz)    Physical Exam:  BP (!) 125/40 (BP Location: Right Arm)   Pulse 66   Temp 98.2 F (36.8 C) (Oral)   Resp 16   Ht 5\' 1"  (1.549 m)   Wt 84.5 kg (186 lb 3 oz)   SpO2 97%   BMI 35.18 kg/m  Constitutional: She appears well-developed and well-nourished. NAD HENT: Normocephalic and atraumatic.  Eyes: EOMI. No discharge.  Cardiovascular: RRR. No JVD. Respiratory: Effort normal and breath sounds normal.  GI: Soft. Bowel sounds are normal.   Musculoskeletal: She exhibits no edema or tenderness.  Neurological: She is alert and oriented. A cranial nerve deficit is present.  Dyssarthria.  Right facial weakness.   Motor  B/l UE 4+/5 prox to distal.  B/l LE: 4/5 prox to distal.  Mild LUE dysmetria Sensation intact   Skin: Skin is warm and dry.  Psychiatric: Flat affect.   Assessment/Plan: 1. Functional deficits secondary to right basal ganglia /parietal infarcts which require 3+ hours per day of interdisciplinary therapy in a comprehensive inpatient rehab setting. Physiatrist is providing close team supervision  and 24 hour management of active medical problems listed below. Physiatrist and rehab team continue to assess barriers to discharge/monitor patient progress toward functional and medical goals.  Function:  Bathing Bathing position      Bathing parts      Bathing assist        Upper Body Dressing/Undressing Upper body dressing   What is the patient wearing?: Pull over shirt/dress     Pull over shirt/dress - Perfomed by patient: Thread/unthread right sleeve, Thread/unthread left sleeve, Put head through opening, Pull shirt over trunk          Upper body assist Assist Level: Set up      Lower Body Dressing/Undressing Lower body dressing   What is the patient wearing?: Socks, Shoes             Socks - Performed by patient: Don/doff right sock, Don/doff left sock   Shoes - Performed by patient: Don/doff right shoe, Don/doff left shoe            Lower body assist Assist for lower body dressing: Touching or steadying assistance (Pt > 75%)      Toileting Toileting   Toileting steps completed by patient: Adjust clothing prior to toileting, Performs perineal hygiene, Adjust clothing after toileting      Toileting  assist Assist level: Touching or steadying assistance (Pt.75%)   Transfers Chair/bed transfer   Chair/bed transfer method: Stand pivot Chair/bed transfer assist level: Touching or steadying assistance (Pt > 75%)       Locomotion Ambulation     Max distance: >150  Assist level: Touching or steadying assistance (Pt > 75%)   Wheelchair Wheelchair activity did not occur: N/A        Cognition Comprehension Comprehension assist level: Follows basic conversation/direction with no assist  Expression Expression assist level: Expresses basic needs/ideas: With no assist  Social Interaction Social Interaction assist level: Interacts appropriately with others - No medications needed.  Problem Solving Problem solving assist level: Solves basic problems with  no assist  Memory Memory assist level: Recognizes or recalls 90% of the time/requires cueing < 10% of the time    Medical Problem List and Plan: 1.  Functional and swallowing deficits secondary to right basal ganglia /parietal infarcts on 5/26  Cont CIR 2.  DVT Prophylaxis/Anticoagulation: Pharmaceutical: Lovenox 3. Chronic pain/Pain Management: continue tramadol prn. Resume Neurontin as indicating increase pain in hands/feet.  4. Mood: LCSW to follow for evaluation and support.  5. Neuropsych: This patient is not fully capable of making decisions on her own behalf. 6. Skin/Wound Care: routine pressure relief measures.  7. Fluids/Electrolytes/Nutrition: Monitor I/Os.  Encourage fluid intake during the day.    IVF qhs, will d/c in future  Will continue to monitor for now.  8. T2DM: Hgb A1c- 6.3. Resumed amaryl and monitor BS ac/hs. Use SSI for elevated BS and titrated meds as needed for tighter control.   Controlled 5/31 9. COPD: Uses 3 L oxygen at nights.  10. Insomnia: continue trazodone prn.  11. Constipation: Resumed linzess.  12. Dyslipidemia : On Crestor.  13. COPD: Resumed Breo. Continue 3 L oxygen at nights.  14. HTN: Monitor BP bid  Permissive HTN for few more days.    Continue to hold losartan/HCTZ  Monitor with increased activity  Improving 5/31 15. Dysphagia: On dysphagia 1, honey liquids by tsp.   Added IVF at nights to help maintain adequate hydration 16. Hypokalemia  K+ 3.2 on 5/30  Supplementing 17. Hypoalbuminemia  Supplement initiated 5/30  LOS (Days) 2 A FACE TO FACE EVALUATION WAS PERFORMED  Ankit Lorie Phenix 10/14/2016 7:57 AM

## 2016-10-14 NOTE — Progress Notes (Signed)
Blood sugars tightly controlled with hypoglycemia despite good intake. Will decrease amaryl to 1 mg daily and monitor.

## 2016-10-15 ENCOUNTER — Inpatient Hospital Stay (HOSPITAL_COMMUNITY): Payer: Medicare Other | Admitting: Occupational Therapy

## 2016-10-15 ENCOUNTER — Inpatient Hospital Stay (HOSPITAL_COMMUNITY): Payer: Medicare Other | Admitting: Physical Therapy

## 2016-10-15 ENCOUNTER — Inpatient Hospital Stay (HOSPITAL_COMMUNITY): Payer: Medicare Other | Admitting: Speech Pathology

## 2016-10-15 DIAGNOSIS — R269 Unspecified abnormalities of gait and mobility: Secondary | ICD-10-CM

## 2016-10-15 DIAGNOSIS — I69398 Other sequelae of cerebral infarction: Secondary | ICD-10-CM

## 2016-10-15 LAB — GLUCOSE, CAPILLARY
Glucose-Capillary: 105 mg/dL — ABNORMAL HIGH (ref 65–99)
Glucose-Capillary: 75 mg/dL (ref 65–99)
Glucose-Capillary: 69 mg/dL (ref 65–99)
Glucose-Capillary: 91 mg/dL (ref 65–99)

## 2016-10-15 MED ORDER — CAMPHOR-MENTHOL 0.5-0.5 % EX LOTN
TOPICAL_LOTION | CUTANEOUS | Status: DC | PRN
  Administered 2016-10-18: 21:00:00 via TOPICAL
  Filled 2016-10-15: qty 222

## 2016-10-15 MED ORDER — POTASSIUM CHLORIDE IN NACL 20-0.9 MEQ/L-% IV SOLN
INTRAVENOUS | Status: AC
  Administered 2016-10-15: 19:00:00 via INTRAVENOUS
  Filled 2016-10-15: qty 1000

## 2016-10-15 NOTE — Progress Notes (Signed)
[facility]     PROGRESS NOTE  Subjective/Complaints:  Pt seen laying in bed this AM.  Husband at bedside.  Pt slept well overnight.  She had a good first day in therapies.   ROS: Denies CP, SOB, N/V/D.  Objective: Vital Signs: Blood pressure (!) 124/46, pulse 80, temperature 99.3 F (37.4 C), temperature source Oral, resp. rate (!) 26, height 5\' 1"  (1.549 m), weight 90.3 kg (199 lb 1.6 oz), SpO2 97 %. No results found.  Recent Labs  10/13/16 0450  WBC 9.1  HGB 12.6  HCT 39.6  PLT 313    Recent Labs  10/13/16 0450  NA 142  K 3.2*  CL 108  GLUCOSE 110*  BUN 10  CREATININE 0.82  CALCIUM 9.0   CBG (last 3)   Recent Labs  10/14/16 1656 10/14/16 2116 10/15/16 0642  GLUCAP 83 94 105*    Wt Readings from Last 3 Encounters:  10/15/16 90.3 kg (199 lb 1.6 oz)  10/10/16 88.1 kg (194 lb 4.8 oz)  09/15/16 87.6 kg (193 lb 3.2 oz)    Physical Exam:  BP (!) 124/46 (BP Location: Left Arm) Comment (BP Location): bp was taken in left lower arm, cuff was not staying on good  Pulse 80   Temp 99.3 F (37.4 C) (Oral)   Resp (!) 26 Comment: rn notified  Ht 5\' 1"  (1.549 m)   Wt 90.3 kg (199 lb 1.6 oz)   SpO2 97%   BMI 37.62 kg/m  Constitutional: She appears well-developed and well-nourished. NAD HENT: Normocephalic and atraumatic.  Eyes: EOMI. No discharge.  Cardiovascular: RRR. No JVD. Respiratory: Effort normal and breath sounds normal.  GI: Soft. Bowel sounds are normal.   Musculoskeletal: She exhibits no edema or tenderness.  Neurological: She is alert and oriented. A cranial nerve deficit is present.  Dyssarthria.  Right facial weakness.   Motor  B/l UE 4+/5 prox to distal.  B/l LE: 4/5 prox to distal.  Mild LUE dysmetria Sensation intact   Skin: Skin is warm and dry.  Psychiatric: Flat affect.   Assessment/Plan: 1. Functional deficits secondary to right basal ganglia /parietal infarcts which require 3+ hours per day of  interdisciplinary therapy in a comprehensive inpatient rehab setting. Physiatrist is providing close team supervision and 24 hour management of active medical problems listed below. Physiatrist and rehab team continue to assess barriers to discharge/monitor patient progress toward functional and medical goals.  Function:  Bathing Bathing position      Bathing parts      Bathing assist        Upper Body Dressing/Undressing Upper body dressing   What is the patient wearing?: Pull over shirt/dress     Pull over shirt/dress - Perfomed by patient: Thread/unthread right sleeve, Thread/unthread left sleeve, Put head through opening, Pull shirt over trunk          Upper body assist Assist Level: Set up      Lower Body Dressing/Undressing Lower body dressing   What is the patient wearing?: Socks, Shoes             Socks - Performed by patient: Don/doff right sock, Don/doff left sock   Shoes - Performed by patient: Don/doff right shoe, Don/doff left shoe            Lower body assist Assist for lower body dressing: Touching or steadying assistance (Pt > 75%)      Toileting Toileting   Toileting steps completed by  patient: Adjust clothing prior to toileting, Performs perineal hygiene, Adjust clothing after toileting   Toileting Assistive Devices: Grab bar or rail  Toileting assist Assist level: Touching or steadying assistance (Pt.75%)   Transfers Chair/bed transfer   Chair/bed transfer method: Ambulatory Chair/bed transfer assist level: Supervision or verbal cues Chair/bed transfer assistive device: Armrests     Locomotion Ambulation     Max distance: 210 ft Assist level: Supervision or verbal cues   Wheelchair Wheelchair activity did not occur: N/A        Cognition Comprehension Comprehension assist level: Follows basic conversation/direction with extra time/assistive device  Expression Expression assist level: Expresses basic 25 - 49% of the  time/requires cueing 50 - 75% of the time. Uses single words/gestures.  Social Interaction Social Interaction assist level: Interacts appropriately with others - No medications needed.  Problem Solving Problem solving assist level: Solves complex 90% of the time/cues < 10% of the time  Memory Memory assist level: Recognizes or recalls 90% of the time/requires cueing < 10% of the time    Medical Problem List and Plan: 1.  Functional and swallowing deficits secondary to right basal ganglia /parietal infarcts on 5/26  Cont CIR PT, OT, SLP 2.  DVT Prophylaxis/Anticoagulation: Pharmaceutical: Lovenox 3. Chronic pain/Pain Management: continue tramadol prn. Resume Neurontin as indicating increase pain in hands/feet.  4. Mood: LCSW to follow for evaluation and support.  5. Neuropsych: This patient is not fully capable of making decisions on her own behalf. 6. Skin/Wound Care: routine pressure relief measures. Pruritis but no rash- using soap from home, Plavix is new but given lack of rash and importance of med would not d/c at this time- start Sarna7. Fluids/Electrolytes/Nutrition: Monitor I/Os.ECHO ej fx 60-65%  Encourage fluid intake during the day.  ~562ml during the day  IVF qhs, will d/c in future  Will continue to monitor for now.  8. T2DM: Hgb A1c- 6.3. Resumed amaryl and monitor BS ac/hs. Use SSI for elevated BS and titrated meds as needed for tighter control.    CBG (last 3) Improved 6/1  Recent Labs  10/14/16 1656 10/14/16 2116 10/15/16 0642  GLUCAP 83 94 105*    9. COPD: Uses 3 L oxygen at nights.  10. Insomnia: continue trazodone prn.  11. Constipation: Resumed linzess.  12. Dyslipidemia : On Crestor.  13. COPD: Resumed Breo. Continue 3 L oxygen at nights.  14. HTN: Monitor BP bid  Permissive HTN for few more days.    Continue to hold losartan/HCTZ   Vitals:   10/14/16 1536 10/15/16 0540  BP: (!) 150/81 (!) 124/46  Pulse: 78 80  Resp: 17 (!) 26  Temp: 98 F (36.7 C)  99.3 F (37.4 C)     Improving 5/31 15. Dysphagia: On dysphagia 1, honey liquids by tsp.   Added IVF at nights to help maintain adequate hydration 16. Hypokalemia  K+ 3.2 on 5/30  Supplementing 17. Hypoalbuminemia  Supplement initiated 5/30  LOS (Days) 3 A FACE TO FACE EVALUATION WAS PERFORMED  Dashana Guizar E 10/15/2016 7:17 AM

## 2016-10-15 NOTE — Progress Notes (Signed)
Occupational Therapy Session Note  Patient Details  Name: Susan Patel MRN: [id number] Date of Birth: August 23, 1940  Today's Date: 10/15/2016 OT Individual Time: 1500-1529 OT Individual Time Calculation (min): 29 min    Short Term Goals: Week 1:  OT Short Term Goal 1 (Week 1): STGs equal to LTGs based on ELOS set at modified independent to supervision.   Skilled Therapeutic Interventions/Progress Updates:    Pt ambulated from her room to the dayroom with supervision.  Had her pick up game mat in the gym and carry as well.  Had pt work on reaching to the floor to pick up bean bags and then toss to the game mat with both the left and right UEs to score points.  Pt completed task with supervision for balance, with max questioning cueing for simple cognitive processing of addition and subtraction.  Completed several intervals without LOB however when ambulating back to her room she did bump into the edge of a door on the left side.  Will continue to further eval vision during function.  Pt left in room with spouse present and pt sitting EOB.      Therapy Documentation Precautions:  Precautions Precautions: Fall Precaution Comments: full supervision for eating, puree diet, honey thick liquids Restrictions Weight Bearing Restrictions: No  Pain: Pain Assessment Pain Assessment: No/denies pain ADL: See Function Navigator for Current Functional Status.   Therapy/Group: Individual Therapy  Harveen Flesch  OTR/L 10/15/2016, 4:07 PM

## 2016-10-15 NOTE — Progress Notes (Signed)
Speech Language Pathology Daily Session Note  Patient Details  Name: Susan Patel MRN: [id number] Date of Birth: Feb 23, 1941  Today's Date: 10/15/2016 SLP Individual Time: 1045-1130 SLP Individual Time Calculation (min): 45 min  Short Term Goals: Week 1: SLP Short Term Goal 1 (Week 1): STGs=LTGs  Skilled Therapeutic Interventions: Skilled treatment session focused on speech and dysphagia goals. Patient participated in a verbal description task at the word and phrase level and was ~90% intelligible at the word level and ~75% intelligible at the phrase level with Max A verbal and visual cues (video feedback). Patient with minimal lingual movement during verbal expression and sitting with her oral cavity open at rest that required Max A verbal and visual cues for emergent awareness. Patient also consumed honey-thick liquids via cup without overt s/s of aspiration but demonstrated inconsistent timing of swallowing initiation (timely-~1 minute). Recommend patient continue current diet. Patient left sitting in recliner with chair alarm on and all needs within reach. Continue with current plan of care.          Function:  Eating Eating   Modified Consistency Diet: Yes Eating Assist Level: More than reasonable amount of time;Supervision or verbal cues           Cognition Comprehension Comprehension assist level: Follows basic conversation/direction with extra time/assistive device  Expression   Expression assist level: Expresses basic 25 - 49% of the time/requires cueing 50 - 75% of the time. Uses single words/gestures.  Social Interaction Social Interaction assist level: Interacts appropriately with others - No medications needed.  Problem Solving Problem solving assist level: Solves complex 90% of the time/cues < 10% of the time  Memory Memory assist level: Recognizes or recalls 90% of the time/requires cueing < 10% of the time    Pain Pain Assessment Pain Assessment: No/denies  pain  Therapy/Group: Individual Therapy  Amado Andal 10/15/2016, 3:32 PM

## 2016-10-15 NOTE — Progress Notes (Signed)
Occupational Therapy Session Note  Patient Details  Name: Susan Patel MRN: [id number] Date of Birth: 25-Nov-1940  Today's Date: 10/15/2016 OT Individual Time: 1062-6948 OT Individual Time Calculation (min): 60 min    Short Term Goals: Week 1:  OT Short Term Goal 1 (Week 1): STGs equal to LTGs based on ELOS set at modified independent to supervision.   Skilled Therapeutic Interventions/Progress Updates:    Pt received in room in bed napping. Pt's spouse stated she was up much earlier and he had provided the supervision for her transfers, shower and dressing this morning. He reports that she did not need any physical assist.  When asked, what he feels therapy needs to focus on the most to enable him to care for her at home he stated her balance and her speech.  Pt took a few minutes to wake up and then ambulated to gym with close S.  Pt worked on a variety of dynamic balance exercises with a focus on LLE coordination and stepping towards her L.  She needed max cues to follow directions with simple motor commands with visual demonstration of each exercise, such as stepping left foot out and in, forward stepping, hip abduction.  She did better with a visual target such as step on the red circle.  She worked on dynamic reaching by stepping to her left to retrieve a peg and then a small forward lunge to place peg in board on a stool.  Close S to contact guard with all standing exercises.  During seated rest breaks, she sat in arm chair and cued to sit upright to challenge posture while working with weighted dowel bar for overhead lifts, rowing exercises. During activity pt was asked to count out loud to work on her verbal skills.  She also talked about what she grows in her home garden and was more intelligible today.   Pt demonstrated good activity tolerance.  Pt then ambulated to speech therapy office for her next session.      Therapy Documentation Precautions:  Precautions Precautions:  Fall Precaution Comments: full supervision for eating, puree diet, honey thick liquids Restrictions Weight Bearing Restrictions: No   Pain: Pain Assessment Pain Assessment: No/denies pain ADL:  See Function Navigator for Current Functional Status.   Therapy/Group: Individual Therapy  [place] 10/15/2016, 11:36 AM

## 2016-10-15 NOTE — Progress Notes (Signed)
Physical Therapy Session Note  Patient Details  Name: Susan Patel MRN: [id number] Date of Birth: 04/19/1941  Today's Date: 10/15/2016 PT Individual Time: 1300-1400 PT Individual Time Calculation (min): 60 min   Short Term Goals: Week 1:  PT Short Term Goal 1 (Week 1): STG = LTG due to short ELOS.  Skilled Therapeutic Interventions/Progress Updates:    Pt sitting EOB finishing lunch upon arrival, agreeable to PT session. Supervision donning Rt sock and min assist with Lt, min assist donning shoes. Ambulation: 220 ft, 100 ft, 175 ft with close supervision. Pt easily distracted, cues to scan environment. Occasionally running into objects in hall. Balance: static standing with progression to standing on foam. Adding reaching for objects forward and at crown height. Dynamic balance with reaching and tossing with focus on weight shifts. Standing over shoulder head and trunk turns. Following session, pt returned to bed with husband in room and all needs in reach.   Therapy Documentation Precautions:  Precautions Precautions: Fall Precaution Comments: full supervision for eating, puree diet, honey thick liquids Restrictions Weight Bearing Restrictions: No   Pain: Pain Assessment Pain Assessment: No/denies pain   See Function Navigator for Current Functional Status.   Therapy/Group: Individual Therapy  Linard Millers, PT 10/15/2016, 1:33 PM

## 2016-10-16 ENCOUNTER — Inpatient Hospital Stay (HOSPITAL_COMMUNITY): Payer: Medicare Other | Admitting: Speech Pathology

## 2016-10-16 ENCOUNTER — Inpatient Hospital Stay (HOSPITAL_COMMUNITY): Payer: Medicare Other | Admitting: Physical Therapy

## 2016-10-16 ENCOUNTER — Inpatient Hospital Stay (HOSPITAL_COMMUNITY): Payer: Medicare Other | Admitting: Occupational Therapy

## 2016-10-16 LAB — GLUCOSE, CAPILLARY
GLUCOSE-CAPILLARY: 98 mg/dL (ref 65–99)
GLUCOSE-CAPILLARY: 92 mg/dL (ref 65–99)
GLUCOSE-CAPILLARY: 87 mg/dL (ref 65–99)
Glucose-Capillary: 129 mg/dL — ABNORMAL HIGH (ref 65–99)
Glucose-Capillary: 105 mg/dL — ABNORMAL HIGH (ref 65–99)

## 2016-10-16 NOTE — Progress Notes (Signed)
Physical Therapy Session Note  Patient Details  Name: Susan Patel MRN: [id number] Date of Birth: [birth year]  Today's Date: 10/16/2016 PT Individual Time: 1020-1116 PT Individual Time Calculation (min): 56 min   Short Term Goals: Week 1:  PT Short Term Goal 1 (Week 1): STG = LTG due to short ELOS.  Skilled Therapeutic Interventions/Progress Updates:   Pt received supine in bed and agreeable to PT. Supine>sit transfer with min assist from husband and  Min cues to scoot to EOB. PT assisted pt to don shoes for time management.   Gait training instructed by PT to rehab gym with supervision assist and min cues for direction.   Dynamic balance training on airex pad with intermittent 1-0 UE support to complete 2 Peg board puzzles of minimal to moderate difficulty. PT provided questioning cues to allow pt to correct mistakes and occassional directory cues for proper placement and to reffer back to example. Reciprocal foot taps from level surface on 6 inch step x 10 and lateral foot taps on 6 inch step x 10. Min assist from PT and cues for proper speed to improve eccentric control of movements.   PT instructed pt in gait training in various environments including hall of hospital [id number], [id number] supervision assist from PT,. Gait training also completed over uneven cement sidewalk at hospital entrance x [id number]. PT provided min assist while outside to prevent tripping on curb around mulched areas and to prevent hitting bench on the L.   Stair trainining insturcted by PT with close supervision assist x 12 steps (6 inches) with BUE support on R rail. Min cues from PT for step to pattern to improve safter with BUE support on 1 rail.   Throughout treatment, pt instructed sit<>stand transfers x 10 with distant supervision assist from PT and min cues for safety.   Pt returned to room and performed ambulatory transfer to bed with supervision assist. Sit>supine completed with supervision  assist from PT with min cues for proper positioning in bed. PT left supine in bed with call bell in reach and all needs met.       Therapy Documentation Precautions:  Precautions Precautions: Fall Precaution Comments: full supervision for eating, puree diet, honey thick liquids Restrictions Weight Bearing Restrictions: No Pain: Pain Assessment Pain Assessment: No/denies pain  See Function Navigator for Current Functional Status.   Therapy/Group: Individual Therapy  AustiLorie Phenix2018, 11:18 AM

## 2016-10-16 NOTE — Progress Notes (Signed)
Speech Language Pathology Daily Session Note  Patient Details  Name: Susan Patel MRN: [id number] Date of Birth: [birth year]  Today's Date: 10/16/2016 SLP Individual Time: 6222-9798 SLP Individual Time Calculation (min): 45 min  Short Term Goals: Week 1: SLP Short Term Goal 1 (Week 1): STGs=LTGs  Skilled Therapeutic Interventions: Pt was seen for dysphagia and oral motor speech therapy in am in a non distracting environment. Pt was given CVC words with moderate accuracy for initial sounds, but slow accuracy with ending consonant. L lingual weakness is prominent. Pt was able to perform OM strengthening ex w/ and without resistance. Pt was given trials of ice chips during session with cough x2. No issues with breakfast tray according to pt. Pt continues to have delayed swallow trigger with poor bolus contol. Performed bolus control ex x 10 and attempted [place], but pt was unable to complete. Pt states she is going home on Tuesday.      Function:  Eating Eating   Modified Consistency Diet: Yes Eating Assist Level: More than reasonable amount of time;Supervision or verbal cues           Cognition Comprehension Comprehension assist level: Follows basic conversation/direction with extra time/assistive device  Expression   Expression assist level: Expresses basic 25 - 49% of the time/requires cueing 50 - 75% of the time. Uses single words/gestures.  Social Interaction Social Interaction assist level: Interacts appropriately with others - No medications needed.  Problem Solving Problem solving assist level: Solves complex 90% of the time/cues < 10% of the time  Memory Memory assist level: Recognizes or recalls 90% of the time/requires cueing < 10% of the time    Pain Pain Assessment Pain Assessment: No/denies pain  Therapy/Group: Individual Therapy  Susan Patel 10/16/2016, 9:24 AM

## 2016-10-16 NOTE — Progress Notes (Signed)
Occupational Therapy Session Note  Patient Details  Name: REGENA DELUCCHI MRN: [id number] Date of Birth: Nov 13, 1940  Today's Date: 10/16/2016 OT Individual Time: 7416-3845 OT Individual Time Calculation (min): 60 min    Skilled Therapeutic Interventions/Progress Updates: Patient seen for [employer] for visual lower right quadrant and reaction speed, as well as left upper and lower quadrant visual pereptual skills.    AS well, patient seen for Left hand dexterity, stereognosis and fine motor skills     Therapy Documentation Precautions:  Precautions Precautions: Fall Precaution Comments: full supervision for eating, puree diet, honey thick liquids Restrictions Weight Bearing Restrictions: No   Pain  Denied  Vision When asked how is her vision ,she replied, "It is fuzzy," but husband replied her vision changed with a previous stroke, that her eye doc is working with her vision, and that if she wears her classes, it helps sometimes.   Pt. Does not have her glasses here.    See Function Navigator for Current Functional Status.   Therapy/Group: Individual Therapy  Alfredia Ferguson [facility] 10/16/2016, 2:44 PM

## 2016-10-16 NOTE — Progress Notes (Signed)
[place] PHYSICAL MEDICINE & REHABILITATION     PROGRESS NOTE  Subjective/Complaints:  Just waking up. Husband at bedside. No new complaints. Slept well  ROS: pt denies nausea, vomiting, diarrhea, cough, shortness of breath or chest pain  Objective: Vital Signs: Blood pressure (!) 139/57, pulse 65, temperature 98 F (36.7 C), temperature source Oral, resp. rate 18, height 5\' 1"  (1.549 m), weight 91.4 kg (201 lb 6.4 oz), SpO2 98 %. No results found. No results for input(s): WBC, HGB, HCT, PLT in the last 72 hours. No results for input(s): NA, K, CL, GLUCOSE, BUN, CREATININE, CALCIUM in the last 72 hours.  Invalid input(s): CO CBG (last 3)   Recent Labs  10/15/16 1719 10/15/16 2158 10/16/16 0611  GLUCAP 69 91 98    Wt Readings from Last 3 Encounters:  10/16/16 91.4 kg (201 lb 6.4 oz)  10/10/16 88.1 kg (194 lb 4.8 oz)  09/15/16 87.6 kg (193 lb 3.2 oz)    Physical Exam:  BP (!) 139/57 (BP Location: Right Arm)   Pulse 65   Temp 98 F (36.7 C) (Oral)   Resp 18   Ht 5\' 1"  (1.549 m)   Wt 91.4 kg (201 lb 6.4 oz)   SpO2 98%   BMI 38.05 kg/m  Constitutional: She appears well-developed and well-nourished. NAD HENT: Normocephalic and atraumatic.  Eyes: EOMI. No discharge.  Cardiovascular: RRR. Respiratory: normal effortl.  GI: Soft. Bowel sounds are normal.   Musculoskeletal: She exhibits no edema or tenderness.  Neurological: She is alert and oriented. A cranial nerve deficit is present.  Dyssarthric and difficult to understand at times.  Right facial weakness.   Motor  B/l UE 4+/5 prox to distal.  B/l LE: 4/5 prox to distal.  Mild LUE dysmetria Sensation intact   Skin: Skin is warm and dry.  Psychiatric: Flat affect.   Assessment/Plan: 1. Functional deficits secondary to right basal ganglia /parietal infarcts which require 3+ hours per day of interdisciplinary therapy in a comprehensive inpatient rehab setting. Physiatrist is providing close team supervision and  24 hour management of active medical problems listed below. Physiatrist and rehab team continue to assess barriers to discharge/monitor patient progress toward functional and medical goals.  Function:  Bathing Bathing position   Position: Shower  Bathing parts Body parts bathed by patient: Right arm, Left arm, Chest, Abdomen, Front perineal area, Buttocks, Right upper leg, Left upper leg, Right lower leg, Left lower leg, Back    Bathing assist Assist Level: Supervision or verbal cues (per husbands report, showered with his supervision)      Upper Body Dressing/Undressing Upper body dressing   What is the patient wearing?: Pull over shirt/dress     Pull over shirt/dress - Perfomed by patient: Thread/unthread right sleeve, Thread/unthread left sleeve, Put head through opening, Pull shirt over trunk          Upper body assist Assist Level: Set up      Lower Body Dressing/Undressing Lower body dressing   What is the patient wearing?: Socks, Shoes             Socks - Performed by patient: Don/doff right sock, Don/doff left sock   Shoes - Performed by patient: Don/doff right shoe, Don/doff left shoe            Lower body assist Assist for lower body dressing: Touching or steadying assistance (Pt > 75%)      Toileting Toileting   Toileting steps completed by patient: Adjust clothing prior to  toileting, Performs perineal hygiene, Adjust clothing after toileting   Toileting Assistive Devices: Grab bar or rail  Toileting assist Assist level: Supervision or verbal cues   Transfers Chair/bed transfer   Chair/bed transfer method: Ambulatory Chair/bed transfer assist level: Supervision or verbal cues Chair/bed transfer assistive device: Armrests     Locomotion Ambulation     Max distance: 200 ft Assist level: Supervision or verbal cues   Wheelchair Wheelchair activity did not occur: N/A        Cognition Comprehension Comprehension assist level: Follows basic  conversation/direction with extra time/assistive device  Expression Expression assist level: Expresses basic 25 - 49% of the time/requires cueing 50 - 75% of the time. Uses single words/gestures.  Social Interaction Social Interaction assist level: Interacts appropriately with others - No medications needed.  Problem Solving Problem solving assist level: Solves basic 75 - 89% of the time/requires cueing 10 - 24% of the time  Memory Memory assist level: Recognizes or recalls 90% of the time/requires cueing < 10% of the time    Medical Problem List and Plan: 1.  Functional and swallowing deficits secondary to right basal ganglia /parietal infarcts on 5/26  Cont CIR PT, OT, SLP 2.  DVT Prophylaxis/Anticoagulation: Pharmaceutical: Lovenox 3. Chronic pain/Pain Management: continue tramadol prn. Resume Neurontin as indicating increase pain in hands/feet.  4. Mood: LCSW to follow for evaluation and support.  5. Neuropsych: This patient is not fully capable of making decisions on her own behalf. 6. Skin/Wound Care: routine pressure relief measures. Pruritis but no rash- using soap from home,   -sarna helpful? 7. Fluids/Electrolytes/Nutrition: Monitor I/Os.ECHO ej fx 60-65%  Encourage fluid intake during the day.  ~537ml during the day  IVF qhs, will d/c in future once fluids upgraded  Will continue to monitor for now.  8. T2DM: Hgb A1c- 6.3. Resumed amaryl and monitor BS ac/hs. Use SSI for elevated BS and titrated meds as needed for tighter control.   -sugars too low, will hold amaryl  CBG (last 3)   Recent Labs  10/15/16 1719 10/15/16 2158 10/16/16 0611  GLUCAP 69 91 98    9. COPD: Uses 3 L oxygen at nights.  10. Insomnia: continue trazodone prn.  11. Constipation: Resumed linzess.  12. Dyslipidemia : On Crestor.  13. COPD: Resumed Breo. Continue 3 L oxygen at nights.  14. HTN: Monitor BP bid  Permissive HTN for few more days.    Continue to hold losartan/HCTZ   Vitals:   10/15/16  1430 10/16/16 0700  BP: (!) 132/50 (!) 139/57  Pulse: 80 65  Resp: 20 18  Temp: 98.1 F (36.7 C) 98 F (36.7 C)     Improving 5/31 15. Dysphagia: On dysphagia 1, honey liquids by tsp.   Added IVF at nights to help maintain adequate hydration 16. Hypokalemia  K+ 3.2 on 5/30  Supplementing 17. Hypoalbuminemia  Supplement initiated 5/30  LOS (Days) 4 A FACE TO FACE EVALUATION WAS PERFORMED  Vernell Townley T 10/16/2016 8:04 AM

## 2016-10-16 NOTE — Progress Notes (Signed)
Occupational Therapy Session Note  Patient Details  Name: Susan Patel MRN: [id number] Date of Birth: [birth year]  Today's Date: 10/16/2016 OT Individual Time: 3241-9914 OT Individual Time Calculation (min): 36 min    Skilled Therapeutic Interventions/Progress Updates: patient completed L hand [place];   Transfer into bed and Bed mobility with CGA (mat height her  bed at home and then 2 steps placed similar to home; patient assisted back to her room and left sitting EOB with husband close by  Therapy Documentation Precautions:  Precautions Precautions: Fall Precaution Comments: full supervision for eating, puree diet, honey thick liquids Restrictions Weight Bearing Restrictions: No :  Pain:denied   Therapy/Group: Individual Therapy  Alfredia Ferguson [facility] 10/16/2016, 9:41 PM

## 2016-10-17 LAB — GLUCOSE, CAPILLARY
GLUCOSE-CAPILLARY: 131 mg/dL — AB (ref 65–99)
Glucose-Capillary: 94 mg/dL (ref 65–99)
Glucose-Capillary: 117 mg/dL — ABNORMAL HIGH (ref 65–99)
Glucose-Capillary: 97 mg/dL (ref 65–99)

## 2016-10-17 MED ORDER — PHENOL 1.4 % MT LIQD
1.0000 | OROMUCOSAL | Status: DC | PRN
  Administered 2016-10-17 – 2016-10-18 (×2): 1 via OROMUCOSAL
  Filled 2016-10-17 (×2): qty 177

## 2016-10-17 MED ORDER — SODIUM CHLORIDE 0.45 % IV SOLN
INTRAVENOUS | Status: DC
  Administered 2016-10-17: 18:00:00 via INTRAVENOUS

## 2016-10-17 NOTE — Progress Notes (Signed)
[place] PHYSICAL MEDICINE & REHABILITATION     PROGRESS NOTE  Subjective/Complaints:  Pt resting comfortably. No new problems. Trying to take in honeys best she can. Husband at bedside  ROS: pt denies nausea, vomiting, diarrhea, cough, shortness of breath or chest pain   Objective: Vital Signs: Blood pressure (!) 155/54, pulse 63, temperature 98.1 F (36.7 C), temperature source Oral, resp. rate 16, height 5\' 1"  (1.549 m), weight 91.4 kg (201 lb 9.6 oz), SpO2 98 %. No results found. No results for input(s): WBC, HGB, HCT, PLT in the last 72 hours. No results for input(s): NA, K, CL, GLUCOSE, BUN, CREATININE, CALCIUM in the last 72 hours.  Invalid input(s): CO CBG (last 3)   Recent Labs  10/16/16 1636 10/16/16 2053 10/17/16 0724  GLUCAP 129* 105* 94    Wt Readings from Last 3 Encounters:  10/17/16 91.4 kg (201 lb 9.6 oz)  10/10/16 88.1 kg (194 lb 4.8 oz)  09/15/16 87.6 kg (193 lb 3.2 oz)    Physical Exam:  BP (!) 155/54 (BP Location: Left Arm)   Pulse 63   Temp 98.1 F (36.7 C) (Oral)   Resp 16   Ht 5\' 1"  (1.549 m)   Wt 91.4 kg (201 lb 9.6 oz)   SpO2 98%   BMI 38.09 kg/m  Constitutional: She appears well-developed and well-nourished. NAD HENT: Normocephalic and atraumatic.  Eyes: EOMI. No discharge.  Cardiovascular: RRR. Respiratory: normal effort GI: Soft. Bowel sounds are normal.   Musculoskeletal: She exhibits no edema or tenderness.  Neurological: She is alert and oriented. A cranial nerve deficit is present.  Dyssarthric speech  Right facial weakness.   Motor  B/l UE 4+/5 prox to distal.  B/l LE: 4/5 prox to distal.  Mild LUE dysmetria Sensation intact   Skin: Skin is warm and dry.  Psychiatric: Flat affect.   Assessment/Plan: 1. Functional deficits secondary to right basal ganglia /parietal infarcts which require 3+ hours per day of interdisciplinary therapy in a comprehensive inpatient rehab setting. Physiatrist is providing close team  supervision and 24 hour management of active medical problems listed below. Physiatrist and rehab team continue to assess barriers to discharge/monitor patient progress toward functional and medical goals.  Function:  Bathing Bathing position   Position: Shower  Bathing parts Body parts bathed by patient: Right arm, Left arm, Chest, Abdomen, Front perineal area, Buttocks, Right upper leg, Left upper leg, Right lower leg, Left lower leg, Back    Bathing assist Assist Level: Supervision or verbal cues (per husbands report, showered with his supervision)      Upper Body Dressing/Undressing Upper body dressing   What is the patient wearing?: Pull over shirt/dress     Pull over shirt/dress - Perfomed by patient: Thread/unthread right sleeve, Thread/unthread left sleeve, Put head through opening, Pull shirt over trunk          Upper body assist Assist Level: Set up      Lower Body Dressing/Undressing Lower body dressing   What is the patient wearing?: Socks, Shoes             Socks - Performed by patient: Don/doff right sock, Don/doff left sock   Shoes - Performed by patient: Don/doff right shoe, Don/doff left shoe            Lower body assist Assist for lower body dressing: Touching or steadying assistance (Pt > 75%)      Toileting Toileting   Toileting steps completed by patient: Adjust clothing prior  to toileting, Performs perineal hygiene, Adjust clothing after toileting   Toileting Assistive Devices: Grab bar or rail  Toileting assist Assist level: Supervision or verbal cues   Transfers Chair/bed transfer   Chair/bed transfer method: Ambulatory Chair/bed transfer assist level: Supervision or verbal cues Chair/bed transfer assistive device: Armrests     Locomotion Ambulation     Max distance: 5103ft  Assist level: Supervision or verbal cues   Wheelchair Wheelchair activity did not occur: N/A        Cognition Comprehension Comprehension assist level:  Follows complex conversation/direction with extra time/assistive device  Expression Expression assist level: Expresses basic 25 - 49% of the time/requires cueing 50 - 75% of the time. Uses single words/gestures.  Social Interaction Social Interaction assist level: Interacts appropriately with others - No medications needed.  Problem Solving Problem solving assist level: Solves basic problems with no assist  Memory Memory assist level: Recognizes or recalls 90% of the time/requires cueing < 10% of the time    Medical Problem List and Plan: 1.  Functional and swallowing deficits secondary to right basal ganglia /parietal infarcts on 5/26  Cont CIR PT, OT, SLP 2.  DVT Prophylaxis/Anticoagulation: Pharmaceutical: Lovenox 3. Chronic pain/Pain Management: continue tramadol prn. Resume Neurontin as indicating increase pain in hands/feet.  4. Mood: LCSW to follow for evaluation and support.  5. Neuropsych: This patient is not fully capable of making decisions on her own behalf. 6. Skin/Wound Care: routine pressure relief measures. Pruritis but no rash- using soap from home,   -sarna helpful? 7. Fluids/Electrolytes/Nutrition: Monitor I/Os.ECHO ej fx 60-65%  Encourage fluid intake during the day.  only around 400cc Friday and 240cc recorded yesterday  IVF qhs order expired. Still not taking in much as far as fluids are concerned   -resume IVF   -follow bmet in am 8. T2DM: Hgb A1c- 6.3. Resumed amaryl and monitor BS ac/hs. Use SSI for elevated BS and titrated meds as needed for tighter control.   -due to hypoglycemia, holding amaryl  CBG (last 3)   Recent Labs  10/16/16 1636 10/16/16 2053 10/17/16 0724  GLUCAP 129* 105* 94    9. COPD: Uses 3 L oxygen at nights.  10. Insomnia: continue trazodone prn.  11. Constipation: Resumed linzess.  12. Dyslipidemia : On Crestor.  13. COPD: Resumed Breo. Continue 3 L oxygen at nights.  14. HTN: Monitor BP bid  Permissive HTN for few more days.     Continue to hold losartan/HCTZ   Vitals:   10/16/16 1427 10/17/16 0458  BP: (!) 116/55 (!) 155/54  Pulse: 72 63  Resp: 20 16  Temp: 97.7 F (36.5 C) 98.1 F (36.7 C)     Improving 5/31 15. Dysphagia: On dysphagia 1, honey liquids by tsp.   HS IVF as above 16. Hypokalemia  K+ 3.2 on 5/30  Supplementing--recheck monday 17. Hypoalbuminemia  Supplement initiated 5/30  LOS (Days) 5 A FACE TO FACE EVALUATION WAS PERFORMED  SWARTZ,ZACHARY T 10/17/2016 8:07 AM

## 2016-10-18 ENCOUNTER — Inpatient Hospital Stay (HOSPITAL_COMMUNITY): Payer: Medicare Other | Admitting: Occupational Therapy

## 2016-10-18 ENCOUNTER — Inpatient Hospital Stay (HOSPITAL_COMMUNITY): Payer: Medicare Other | Admitting: Physical Therapy

## 2016-10-18 ENCOUNTER — Inpatient Hospital Stay (HOSPITAL_COMMUNITY): Payer: Medicare Other | Admitting: Speech Pathology

## 2016-10-18 LAB — BASIC METABOLIC PANEL
ANION GAP: 8 (ref 5–15)
BUN: 16 mg/dL (ref 6–20)
CALCIUM: 8.9 mg/dL (ref 8.9–10.3)
CHLORIDE: 103 mmol/L (ref 101–111)
CO2: 29 mmol/L (ref 22–32)
Creatinine, Ser: 1.03 mg/dL — ABNORMAL HIGH (ref 0.44–1.00)
GFR calc Af Amer: >60 mL/min (ref >60)
GFR calc non Af Amer: 53 mL/min — ABNORMAL LOW (ref >60)
Glucose, Bld: 104 mg/dL — ABNORMAL HIGH (ref 65–99)
Potassium: 3.7 mmol/L (ref 3.5–5.1)
Sodium: 140 mmol/L (ref 135–145)

## 2016-10-18 LAB — CBC
HCT: 36.9 % (ref 36.0–46.0)
HEMOGLOBIN: 11.7 g/dL — AB (ref 12.0–15.0)
MCH: 28.1 pg (ref 26.0–34.0)
MCHC: 31.7 g/dL (ref 30.0–36.0)
MCV: 88.7 fL (ref 78.0–100.0)
Platelets: 291 10*3/uL (ref 150–400)
RBC: 4.16 MIL/uL (ref 3.87–5.11)
RDW: 13.5 % (ref 11.5–15.5)
WBC: 6.4 10*3/uL (ref 4.0–10.5)

## 2016-10-18 LAB — GLUCOSE, CAPILLARY
GLUCOSE-CAPILLARY: 96 mg/dL (ref 65–99)
GLUCOSE-CAPILLARY: 98 mg/dL (ref 65–99)
Glucose-Capillary: 74 mg/dL (ref 65–99)
Glucose-Capillary: 96 mg/dL (ref 65–99)

## 2016-10-18 MED ORDER — LOSARTAN POTASSIUM 25 MG PO TABS
12.5000 mg | ORAL_TABLET | Freq: Every day | ORAL | Status: DC
  Administered 2016-10-18 – 2016-10-20 (×3): 12.5 mg via ORAL
  Filled 2016-10-18 (×3): qty 1

## 2016-10-18 NOTE — Plan of Care (Signed)
Problem: RH Toilet Transfers Goal: LTG Patient will perform toilet transfers w/assist (OT) LTG: Patient will perform toilet transfers with assist, with/without cues using equipment (OT)  LTG downgraded to S due to L inattention, visual impairments pt is at a higher fall risk.  Problem: RH Memory Goal: LTG Patient will demonstrate ability for day to day (OT) LTG:  Patient will demonstrate ability for day to day recall/carryover during activities of daily living with assist  (OT)  LTG downgraded to min A for safety at home.

## 2016-10-18 NOTE — Progress Notes (Signed)
Physical Therapy Session Note  Patient Details  Name: Susan Patel MRN: [id number] Date of Birth: 23-Jan-1941  Today's Date: 10/18/2016 PT Individual Time: 1100-1201 PT Individual Time Calculation (min): 61 min   Short Term Goals: Week 1:  PT Short Term Goal 1 (Week 1): STG = LTG due to short ELOS.  Skilled Therapeutic Interventions/Progress Updates:    Pt in gym upon arrival, just finishing with OT. Pt is agreeable to PT session. Transfers: supervision without assistive device, supervision provided for safety and cues for hand placement intermittently. Working on sit<>stand from furniture including couch. Reinforcing hand placement and scooting forward. Ambulation: pt ambulating variable distances throughout session including 400 ft, 300 ft, 200 ft X2 without device. Pt did have 2 loss of balance due to tripping on toe, min assist to recover. Bed mobility: sitting>supine with supervision, cues for sequencing to straighten out in bed. Pt able to scoot up in bed using LEs. Balance: Incorporating matching task with targets at variable heights and sides in standing, 4 inch toe taps on step forward and across midline as well as patterns. Staggered standing with turning bilaterally and static heel-toe. Following session, pt returned to room and requesting to return to bed. Pt in bed with nursing and family present and all needs in reach.   Therapy Documentation Precautions:  Precautions Precautions: Fall Precaution Comments: full supervision for eating, puree diet, honey thick liquids Restrictions Weight Bearing Restrictions: No Pain: Reports LE pain related to neuropathy. Monitored during session.   See Function Navigator for Current Functional Status.   Therapy/Group: Individual Therapy  Linard Millers, PT 10/18/2016, 12:15 PM

## 2016-10-18 NOTE — Progress Notes (Signed)
Occupational Therapy Session Note  Patient Details  Name: Susan Patel MRN: [id number] Date of Birth: [birth year]  Today's Date: 10/18/2016 OT Individual Time: [phone] and 1030-1100 OT Individual Time Calculation (min): 60 min and 30 min  Short Term Goals: Week 1:  OT Short Term Goal 1 (Week 1): STGs equal to LTGs based on ELOS set at modified independent to supervision.   Skilled Therapeutic Interventions/Progress Updates:    Visit 1:  No c/o pain Pt received in room standing at sink finishing to put her makeup on.  Spouse stated she showered last night and she selected her clothing this morning.   Pt agreeable to working on cooking task, but not more housekeeping.  She stated, " I have already made the bed 2x". Spouse stated that they have a housekeeper that does all the cleaning, but pt does the laundry. Pt ambulated to the kitchen.  Gave pt a "tour" of kitchen and asked her to make scrambled eggs with cheese. She demonstrated:  *inattention when leaving the refrigerator open, leaving egg yoke on hands (cues to wash hands).  *Visual L inattention when needed CLOSE S and constant cues when her L hand would get too close to the pan vs keeping her L hand on handle of pan., removing plate from cabinet (6 heavy plates were stacked and she began to pull the one from the middle of the stack) *cues for sequencing steps to get out supplies first.  Post meal prep, reassessed vision. 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  She had very limited tracking to L side, L upper and more severe to L lower quadrant.  Saccades to L very slow and limited.  She can achieve full ROM of eyes but needs to focus on what she is doing.  Inattention noted with vision testing and exercises.  L field cut to 45 %.  Demonstrated with return demonstration with mod cues of: Visual tracking with target with head still. Visual focus on target with head rotation Saccade exercises.  Pt ambulated back to  room and L toe caught on floor. Pt did not lose balance, but was close to it.  Pt wanted to rest in bed. Bed alarm set.   Visit 2:  No c/o pain Discussed with pt and her spouse the visual motor and visual awareness deficits that she has.  Demonstrated to spouse the 3 ocular motor exercises she needs to practice at home with handout instructions.  Discussed the safety concerns at home with cooking and the need for constant S with meal prep due to high risk of injuring her L hand.  Also discussed changing toilet transfer goal to S, versus mod I due to decreased attention to her left.  Spouse stated he understood.  Pt ambulated to laundry room and practiced placing and removing items from front loaded dryer demonstrating safe balance.  She ambulated back to gym and sat on mat for UE AROM coordination exercises.  Pt's PT arrived for her next session.  Therapy Documentation Precautions:  Precautions Precautions: Fall Precaution Comments: full supervision for eating, puree diet, honey thick liquids Restrictions Weight Bearing Restrictions: No    Vital Signs: Oxygen Therapy SpO2: 97 % O2 Device: Not Delivered Pain: Pain Assessment Pain Assessment: 0-10 Pain Score: 0-No pain ADL:    See Function Navigator for Current Functional Status.   Therapy/Group: Individual Therapy  [place] 10/18/2016, 12:59 PM

## 2016-10-18 NOTE — Progress Notes (Signed)
Social Work Patient ID: Susan Patel, female   DOB: 1940/08/25, 76 y.o.   MRN: [id number] Team feels pt will meet her goals by tomorrow, MD feels needs to stay until Wed due to hydration issue. Pt and husband thought she was going home Tomorrow, will ask PA to address with them today.

## 2016-10-18 NOTE — Progress Notes (Signed)
Speech Language Pathology Daily Session Note  Patient Details  Name: Susan Patel MRN: [id number] Date of Birth: [birth year]  Today's Date: 10/18/2016 SLP Individual Time: 1330-1430 SLP Individual Time Calculation (min): 60 min  Short Term Goals: Week 1: SLP Short Term Goal 1 (Week 1): STGs=LTGs  Skilled Therapeutic Interventions: Skilled treatment session focused on dysphagia and speech intelligibility goals. SLP facilitated session by providing skilled observation of pt consuming honey thick liquids via cup and nectar thick liquids via cup. Pt with increased oral holding of honey thick liquids and required Max A cues to initiate swallow. With nectar thick liquids via cup pt with cough on 50% of trials. Modified Barium Swallow Study requested for 10/19/16 to aid in creating safe discharge plan. Pt required Max A faded to Mod A to implement speech intelligibility strategies at the phrase level to achieve ~ 50% intelligibility. Pt was returned to room, left in bed, bed alarm on and all needs within reach. Continue per current plan of care.      Function:  Eating Eating   Modified Consistency Diet: Yes Eating Assist Level: More than reasonable amount of time;Supervision or verbal cues;Set up assist for   Eating Set Up Assist For: Opening containers       Cognition Comprehension Comprehension assist level: Understands complex 90% of the time/cues 10% of the time  Expression   Expression assist level: Expresses basic 25 - 49% of the time/requires cueing 50 - 75% of the time. Uses single words/gestures.  Social Interaction Social Interaction assist level: Interacts appropriately with others with medication or extra time (anti-anxiety, antidepressant).  Problem Solving Problem solving assist level: Solves basic problems with no assist  Memory Memory assist level: Recognizes or recalls 90% of the time/requires cueing < 10% of the time    Pain    Therapy/Group: Individual Therapy  [name] 10/18/2016, 3:33 PM

## 2016-10-18 NOTE — Progress Notes (Signed)
[facility]     PROGRESS NOTE  Subjective/Complaints:  Pt seen sitting up at the edge of the bed this AM.  She slept well overnight.  She want to know when she can be discharged.   ROS: Denies CP, SOB, N/V/D.  Objective: Vital Signs: Blood pressure (!) 147/55, pulse 61, temperature 97.8 F (36.6 C), temperature source Oral, resp. rate 18, height 5\' 1"  (1.549 m), weight 91.4 kg (201 lb 9.6 oz), SpO2 95 %. No results found.  Recent Labs  10/18/16 0720  WBC 6.4  HGB 11.7*  HCT 36.9  PLT 291    Recent Labs  10/18/16 0720  NA 140  K 3.7  CL 103  GLUCOSE 104*  BUN 16  CREATININE 1.03*  CALCIUM 8.9   CBG (last 3)   Recent Labs  10/17/16 1647 10/17/16 2105 10/18/16 0629  GLUCAP 131* 97 96    Wt Readings from Last 3 Encounters:  10/17/16 91.4 kg (201 lb 9.6 oz)  10/10/16 88.1 kg (194 lb 4.8 oz)  09/15/16 87.6 kg (193 lb 3.2 oz)    Physical Exam:  BP (!) 147/55 (BP Location: Right Arm)   Pulse 61   Temp 97.8 F (36.6 C) (Oral)   Resp 18   Ht 5\' 1"  (1.549 m)   Wt 91.4 kg (201 lb 9.6 oz)   SpO2 95%   BMI 38.09 kg/m  Constitutional: She appears well-developed and well-nourished. NAD HENT: Normocephalic and atraumatic.  Eyes: EOMI. No discharge.  Cardiovascular: RRR. No JVD. Respiratory: normal effort.  Clear.  GI: Soft. Bowel sounds are normal.   Musculoskeletal: She exhibits no edema or tenderness.  Neurological: She is alert and oriented. A cranial nerve deficit is present.  Dyssarthria.  Right facial weakness.   Motor  B/l UE 4+/5 prox to distal.  B/l LE: 4/5 prox to distal.  Mild LUE dysmetria Sensation intact   Skin: Skin is warm and dry.  Psychiatric: Flat affect.   Assessment/Plan: 1. Functional deficits secondary to right basal ganglia /parietal infarcts which require 3+ hours per day of interdisciplinary therapy in a comprehensive inpatient rehab setting. Physiatrist is providing close team supervision and  24 hour management of active medical problems listed below. Physiatrist and rehab team continue to assess barriers to discharge/monitor patient progress toward functional and medical goals.  Function:  Bathing Bathing position   Position: Shower  Bathing parts Body parts bathed by patient: Right arm, Left arm, Chest, Abdomen, Front perineal area, Buttocks, Right upper leg, Left upper leg, Right lower leg, Left lower leg, Back    Bathing assist Assist Level: Supervision or verbal cues (per husbands report, showered with his supervision)      Upper Body Dressing/Undressing Upper body dressing   What is the patient wearing?: Pull over shirt/dress     Pull over shirt/dress - Perfomed by patient: Thread/unthread right sleeve, Thread/unthread left sleeve, Put head through opening, Pull shirt over trunk          Upper body assist Assist Level: Set up      Lower Body Dressing/Undressing Lower body dressing   What is the patient wearing?: Socks, Shoes             Socks - Performed by patient: Don/doff right sock, Don/doff left sock   Shoes - Performed by patient: Don/doff right shoe, Don/doff left shoe            Lower body assist Assist for lower body dressing: Touching or  steadying assistance (Pt > 75%)      Toileting Toileting   Toileting steps completed by patient: Adjust clothing prior to toileting, Performs perineal hygiene, Adjust clothing after toileting   Toileting Assistive Devices: Grab bar or rail  Toileting assist Assist level: Supervision or verbal cues   Transfers Chair/bed transfer   Chair/bed transfer method: Ambulatory Chair/bed transfer assist level: Supervision or verbal cues Chair/bed transfer assistive device: Armrests     Locomotion Ambulation     Max distance: 526ft  Assist level: Supervision or verbal cues   Wheelchair Wheelchair activity did not occur: N/A        Cognition Comprehension Comprehension assist level: Follows complex  conversation/direction with extra time/assistive device  Expression Expression assist level: Expresses basic 25 - 49% of the time/requires cueing 50 - 75% of the time. Uses single words/gestures.  Social Interaction Social Interaction assist level: Interacts appropriately with others - No medications needed.  Problem Solving Problem solving assist level: Solves basic problems with no assist  Memory Memory assist level: Recognizes or recalls 90% of the time/requires cueing < 10% of the time    Medical Problem List and Plan: 1.  Functional and swallowing deficits secondary to right basal ganglia /parietal infarcts on 5/26  Cont CIR, plan for d/c tomorrow  Weekend notes reviewed 2.  DVT Prophylaxis/Anticoagulation: Pharmaceutical: Lovenox 3. Chronic pain/Pain Management: continue tramadol prn.  Resumed Neurontin as indicating increase pain in hands/feet.  4. Mood: LCSW to follow for evaluation and support.  5. Neuropsych: This patient is not fully capable of making decisions on her own behalf. 6. Skin/Wound Care: routine pressure relief measures.  7. Fluids/Electrolytes/Nutrition: Monitor I/Os.   Encourage fluid intake during the day.   IVF qhs d/ced  Will continue to monitor for now.   Labs ordered for tomorrow 8. T2DM: Hgb A1c- 6.3.   Use SSI for elevated BS and titrated meds as needed for tighter control.   Amaryl d/ced  9. COPD: Uses 3 L oxygen at nights.  10. Insomnia: continue trazodone prn.  11. Constipation: Resumed linzess.  12. Dyslipidemia : On Crestor.  13. COPD: Resumed Breo. Continue 3 L oxygen at nights.  14. HTN: Monitor BP bid  Continue hold HCTZ  Losartan 12.5 started 6/4 15. Dysphagia: On dysphagia 1, honey liquids by tsp.   Added IVF at nights to help maintain adequate hydration 16. Hypokalemia  K+ 3.7 on 6/4  Supplementing 17. Hypoalbuminemia  Supplement initiated 5/30 18. ABLA  Hb 11.7 on 6/4  Cont to monitor  LOS (Days) 6 A FACE TO FACE EVALUATION WAS  PERFORMED  Ankit Lorie Phenix 10/18/2016 8:54 AM

## 2016-10-19 ENCOUNTER — Inpatient Hospital Stay (HOSPITAL_COMMUNITY): Payer: Medicare Other | Admitting: Occupational Therapy

## 2016-10-19 ENCOUNTER — Encounter (HOSPITAL_COMMUNITY): Payer: Medicare Other | Admitting: Speech Pathology

## 2016-10-19 ENCOUNTER — Inpatient Hospital Stay (HOSPITAL_COMMUNITY): Payer: Medicare Other | Admitting: Physical Therapy

## 2016-10-19 ENCOUNTER — Inpatient Hospital Stay (HOSPITAL_COMMUNITY): Payer: Medicare Other

## 2016-10-19 DIAGNOSIS — I69322 Dysarthria following cerebral infarction: Secondary | ICD-10-CM

## 2016-10-19 LAB — BASIC METABOLIC PANEL
Anion gap: 8 (ref 5–15)
BUN: 16 mg/dL (ref 6–20)
CHLORIDE: 104 mmol/L (ref 101–111)
CO2: 28 mmol/L (ref 22–32)
CREATININE: 1.01 mg/dL — AB (ref 0.44–1.00)
Calcium: 9 mg/dL (ref 8.9–10.3)
GFR calc Af Amer: >60 mL/min (ref >60)
GFR calc non Af Amer: 54 mL/min — ABNORMAL LOW (ref >60)
Glucose, Bld: 102 mg/dL — ABNORMAL HIGH (ref 65–99)
Potassium: 3.6 mmol/L (ref 3.5–5.1)
SODIUM: 140 mmol/L (ref 135–145)

## 2016-10-19 LAB — GLUCOSE, CAPILLARY
Glucose-Capillary: 100 mg/dL — ABNORMAL HIGH (ref 65–99)
Glucose-Capillary: 93 mg/dL (ref 65–99)
Glucose-Capillary: 74 mg/dL (ref 65–99)
Glucose-Capillary: 106 mg/dL — ABNORMAL HIGH (ref 65–99)

## 2016-10-19 NOTE — Progress Notes (Signed)
Speech Language Pathology Discharge Summary  Patient Details  Name: Susan Patel MRN: [id number] Date of Birth: Feb 01, 1941    Patient has met 5 of 5 long term goals.  Patient to discharge at overall Min;Mod level.   Clinical Impression/Discharge Summary:   Pt has made slow, functional gains while inpatient and is discharging having met 5 out of 5 short term goals.  Pt has been upgraded to honey thick liquids via small, controlled cup sips with full supervision for use of swallowing precautions.  Pt continues to need purees for safe PO intake due to oral motor weakness.  Pt is overall mod assist for intelligibility at the phrase level.  Pt and family education is complete at this time.  Pt is discharging home with 24/7 supervision from her husband and would benefit from [place] follow up at next level of care.   Care Partner:  Caregiver Able to Provide Assistance: Yes  Type of Caregiver Assistance: Physical;Cognitive  Recommendation:  Outpatient SLP;24 hour supervision/assistance  Rationale for SLP Follow Up: Maximize functional communication;Maximize swallowing safety;Maximize cognitive function and independence;Reduce caregiver burden   Equipment: thickener    Reasons for discharge: Discharged from hospital   Patient/Family Agrees with Progress Made and Goals Achieved: Yes    Arthi Mcdonald, Selinda Orion 10/19/2016, 10:41 PM

## 2016-10-19 NOTE — Discharge Summary (Signed)
Physician Discharge Summary  Patient ID: Susan Patel MRN: [id number] DOB/AGE: [birth year] 76 y.o.  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)   Oropharyngeal dysphagia   Dysmetria   Diabetes mellitus type 2 in nonobese (HCC)   Supplemental oxygen dependent   Benign essential HTN   Hypokalemia   Hypoalbuminemia due to protein-calorie malnutrition [facility])   Discharged Condition: good  Significant Diagnostic Studies:   Labs:  Basic Metabolic Panel:  Recent Labs Lab 10/18/16 0720 10/19/16 0444 10/20/16 0839  NA 140 140 141  K 3.7 3.6 3.7  CL 103 104 105  CO2 29 28 28   GLUCOSE 104* 102* 141*  BUN 16 16 15   CREATININE 1.03* 1.01* 1.00  CALCIUM 8.9 9.0 9.3    CBC:  Recent Labs Lab 10/18/16 0720  WBC 6.4  HGB 11.7*  HCT 36.9  MCV 88.7  PLT 291    CBG:  Recent Labs Lab 10/19/16 0553 10/19/16 1131 10/19/16 1652 10/19/16 2045 10/20/16 0620  GLUCAP 100* 93 74 106* 95    Brief HPI:   Susan Patel a 76 y.o.femaleCOPD- oxygen dependent, T2DM, Neuropathy, chronic pain, CVA with residual visual deficits?, Parkinsonism; who was admitted on 10/09/16 with inability to speak, left sided weakness and difficulty walking. CT head done revealing suspicion of acute right basal ganglia infarct, subacute right occipital infarct, left basal infarct question age and extensive small vessel disease. She receive tPA and continues on ASA for secondary stroke prevention. MRI brain done revealing acute infarcts right basal ganglia anteriorright parietal white matter with chronic right occipital cortical infarct. CTA head/neck showed severe progressive advanced intracranial atherosclerosis. 2 D echo revealed EF 60-65%, with grade one diastolic dysfunction. MBS done yesterday and she was started on dysphagia 1, honey liquids. ASA changed to plavix due to progressive intracranial  atherosclerosis and stroke felt to be thrombotic due to small vessel disease. She reported severe 5/27and repeat CT head without acute changes. Patient with severedysarthria, severe expressive deficits, poor safety as well as decrease in mobility and ability to carry out ADL tasks. CIR recommended by rehab team   Hospital Course: KERRINGTON SOVA was admitted to rehab 10/12/2016 for inpatient therapies to consist of PT, ST and OT at least three hours five days a week. Past admission physiatrist, therapy team and rehab RN have worked together to provide customized collaborative inpatient rehab.  She was maintained on pureed diet and honey liquids by tsp therefore IVF were used at nights to help maintain hydration. Hypokalemia at admission was supplemented briefly and has resolved. Blood pressures were monitored on bid basis and Losartan was resumed on 6/4. Amaryl was resumed at admission and BS were monitored on ac/hs basis. Due to frequent hypoglycemic episodes with BS dropping to 50's, Amaryl was discontinued. Blood sugars have stabilized and are ranging from 70-106 range   Breo was resumed at admission and respiratory status has been stable with use of oxygen at nights. IVF was discontinued on 6/4 and follow up labs showed renal status to be stable. Repeat MBS shows minimal improvement but she has been cleared for fluids by cup. Her husband has been educated on importance of offering fluids frequently and she will need to have lytes rechecked in a week after discharge. Mood has been stable and she has made good progress and is at supervision level at discharge. She will continue to receive follow up outpatient PT, OT and ST  at  [facility] after discharge.    Rehab course: During patient's stay in rehab weekly team conferences were held to monitor patient's progress, set goals and discuss barriers to discharge. At admission, patient required supervision with mobility and min assist with  ADL tasks. She required mod cues to adhere to safe swallow strategies and exhibited moderate to severe dysarthria. She required extra time with max assist for speech intelligibility. Cognition, comprehension and verbal expression appeared to be [facility]. She has had improvement in activity tolerance, balance, postural control, as well as ability to compensate for deficits. She is able to complete ADL task with supervision. She requires supervision for transfers and ambulation without AD. Her Merrilee Jansky has improved from 37/56 to 44/56. She has made slow functional gains in speech and requires mod assist for speech intelligibility. She continues to require pureed food but has been upgraded to honey liquids by cup. Family education has been completed with her husband who will provide assistance as needed after discharge.    Disposition: 01-Home or Self Care  Diet: Pureed. Honey liquids by cup.  Special Instructions: 1. Check blood sugars at least twice a day. 2. Needs BMET rechecked in 5-7 days 3. Needs assistance with medication management and all activity.  4.   Discharge Instructions    Ambulatory referral to Physical Medicine Rehab    Complete by:  As directed    1-2 weeks Transitional care appt     Allergies as of 10/20/2016      Reactions   Azithromycin Other (See Comments)   Renal failure   Prednisone Other (See Comments)   Irritability and insomnia   Sulfamethoxazole-trimethoprim Other (See Comments)   Renal failure   Amlodipine    Caused swelling in legs   Ciprofloxacin Itching, Rash   Tape Rash      Medication List    STOP taking these medications   aspirin 325 MG EC tablet   DULoxetine 30 MG capsule Commonly known as:  CYMBALTA   esomeprazole 20 MG capsule Commonly known as:  NEXIUM   glimepiride 2 MG tablet Commonly known as:  AMARYL   losartan-hydrochlorothiazide 100-25 MG tablet Commonly known as:  HYZAAR     TAKE these medications   acetaminophen 500 MG  tablet Commonly known as:  TYLENOL Take 1 tablet (500 mg total) by mouth every 8 (eight) hours as needed for mild pain or moderate pain. What changed:  how much to take   albuterol 108 (90 Base) MCG/ACT inhaler Commonly known as:  PROVENTIL HFA;VENTOLIN HFA Inhale 1-2 puffs into the lungs every 6 (six) hours as needed for wheezing or shortness of breath.   BREO ELLIPTA 200-25 MCG/INH Aepb Generic drug:  fluticasone furoate-vilanterol Inhale 1 puff into the lungs daily.   cetirizine 10 MG tablet Commonly known as:  ZYRTEC Take 10 mg by mouth daily.   clopidogrel 75 MG tablet Commonly known as:  PLAVIX Take 1 tablet (75 mg total) by mouth daily.   famotidine 20 MG tablet Commonly known as:  PEPCID Take 1 tablet (20 mg total) by mouth 2 (two) times daily.   fluticasone 50 MCG/ACT nasal spray Commonly known as:  FLONASE Place 2 sprays into both nostrils daily. What changed:  when to take this  reasons to take this   gabapentin 300 MG capsule Commonly known as:  NEURONTIN Take 1 capsule (300 mg total) by mouth at bedtime.   latanoprost 0.005 % ophthalmic solution Commonly known as:  XALATAN Place 1  drop into both eyes at bedtime.   linaclotide 72 MCG capsule Commonly known as:  LINZESS Take 1 capsule (72 mcg total) by mouth daily before breakfast.   losartan 25 MG tablet Commonly known as:  COZAAR Take 0.5 tablets (12.5 mg total) by mouth daily.   montelukast 10 MG tablet Commonly known as:  SINGULAIR TAKE 1 TABLET (10 MG TOTAL) BY MOUTH AT BEDTIME.   OXYGEN Inhale 3-4 L into the lungs at bedtime.   pantoprazole 40 MG tablet Commonly known as:  PROTONIX Take 1 tablet (40 mg total) by mouth daily as needed. Notes to patient:  This replaces Nexium--use as needed as it may decrease the efficacy of Plavix.    RESOURCE THICKENUP CLEAR Powd Take 1 g by mouth as needed.   rosuvastatin 40 MG tablet Commonly known as:  CRESTOR Take 1 tablet (40 mg total) by mouth  every evening.   traMADol 50 MG tablet Commonly known as:  ULTRAM Take 1 tablet (50 mg total) by mouth every 8 (eight) hours as needed for moderate pain or severe pain. What changed:  when to take this   traZODone 50 MG tablet Commonly known as:  DESYREL TAKE 1 TABLET (50 MG TOTAL) BY MOUTH NIGHTLY AS NEEDED FOR FOR SLEEP.   triamcinolone 0.025 % ointment Commonly known as:  KENALOG Apply 1 application topically 2 (two) times daily. What changed:  when to take this  reasons to take this   Vitamin D (Ergocalciferol) 50000 units Caps capsule Commonly known as:  DRISDOL Take 50,000 Units by mouth every Monday.      Follow-up Information    Jamse Arn, MD. Call.   Specialty:  Physical Medicine and Rehabilitation Why:  office will call you with follow up appointment Contact information: [address] [place] [address] [phone]        Garvin Fila, MD. Call in 1 day(s).   Specialties:  Neurology, Radiology Why:  for follow up in 4-6 weeks Contact information: [address] [place] [address] [phone]        Eustaquio Maize, MD Follow up on 10/29/2016.   Specialty:  Pediatrics Why:  Appointment @ 2:15 PM. Needs BMET rechecked to monitor hydration status.  Contact information: [place] [address] [phone]           Signed: Bary Leriche 10/20/2016, 9:20 PM

## 2016-10-19 NOTE — Progress Notes (Signed)
Occupational Therapy Session Note  Patient Details  Name: Susan Patel MRN: [id number] Date of Birth: [birth year]  Today's Date: 10/19/2016 OT Individual Time: 1120-1200 OT Individual Time Calculation (min): 40 min    Short Term Goals: Week 1:  OT Short Term Goal 1 (Week 1): STGs equal to LTGs based on ELOS set at modified independent to supervision.   Skilled Therapeutic Interventions/Progress Updates:    At start of session, pt was using the restroom. Pt's spouse reported that he provided S for her to ambulate to the bathroom to shower and to toilet. She was able to completely dress and groom without his assist.  Pt completed toileting with mod I and then ambulated over 300 ft to a therapy room to engage in visual tracking exercises with the dynavision.  Pt complete 3 trials.  The first 2 focused on lower quadrant. On last trial of 1 min, pt used all 5 rings.  She had a score of 3 in lower left quadrant versus 10 in right lower and 8 in left upper.  Reaction time 1.88 in right lower vs. 1.71 overall in other quadrants. Ambulated to table to engage in a cognitive activity of naming flowers and breakfast foods and writing them down. Occasional min cues.  She also worked on visual scanning on a worksheet to locate shapes in a box. She had difficulty focusing on and keeping track of the shapes in her L visual field.  Pt ambulated back to her room.  Educated her spouse on home exercise using a magazine article to work on letter cancellation activity to facilitate L to R scanning.    Pt in room with all needs met.  Therapy Documentation Precautions:  Precautions Precautions: Fall Precaution Comments: full supervision for eating, puree diet, honey thick liquids Restrictions Weight Bearing Restrictions: No   Pain: Pain Assessment Pain Assessment: No/denies pain ADL:  See Function Navigator for Current Functional Status.   Therapy/Group: Individual Therapy  [place] 10/19/2016, 12:37  PM

## 2016-10-19 NOTE — Plan of Care (Signed)
Problem: RH Balance Goal: LTG Patient will maintain dynamic standing with ADLs (OT) LTG:  Patient will maintain dynamic standing balance with assist during activities of daily living (OT)   Outcome: Not Met (add Reason) Needs supervision.   

## 2016-10-19 NOTE — Progress Notes (Signed)
Occupational Therapy Discharge Summary  Patient Details  Name: Susan Patel MRN: [id number] Date of Birth: Aug 26, 1940  Today's Date: 10/19/2016 OT Individual Time: 3212-2482 OT Individual Time Calculation (min): 73 min  Session Note:  Pt completed functional mobility to the BI gym with supervision and no assistive device.  Worked on use of the [employer] in standing for several intervals.  She did not demonstrate any specific pattern during testing, but more random, missing items on both the left and right inferior and superior quadrants.  She averaged a reaction time of around 3 mins overall.  This decreased to 2 seconds, however pt missed 30% of lights when placed on 3 second intervals.  Next she transitioned to visual problem solving task of 28 pice puzzle.  Max assist needed to sequence this.  Ambulated to the therapy gym for last part of session with pt working on dynamic standing balance while picking items up from the floor.  Had pt complete 9 hole peg test in sitting with 38 seconds on the right and 51 on the left.  Worked on ball toss with beach ball and with smaller tennis ball.  She was able to consistently catch and toss the beach ball with 90 % accuracy but only 50% accuracy with the smaller ball.  Completed session with ambulation back to the room.  Pt left sitting on EOB with spouse present.     Patient has met 11 of 12 long term goals due to improved activity tolerance, improved balance, ability to compensate for deficits and improved coordination.  Patient to discharge at overall Supervision level.  Patient's care partner is independent to provide the necessary physical and cognitive assistance at discharge.    Reasons goals not met: Pt still needs supervision for dynamic standing balance.  Recommendation:  Patient will benefit from ongoing skilled OT services in outpatient setting to continue to advance functional skills in the area of BADL.  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  Unsure if these were pre-existing but they currently limit pt's ability to complete some ADL tasks at modified independent level.  Feel pt will benefit from continued outpatient OT to further progress independence for home.    Equipment: No equipment provided  Reasons for discharge: treatment goals met and discharge from hospital  Patient/family agrees with progress made and goals achieved: Yes  OT Discharge Precautions/Restrictions  Precautions Precautions: Fall Restrictions Weight Bearing Restrictions: No  Pain Pain Assessment Pain Assessment: No/denies pain ADL  See Function Section of chart for details  Vision Baseline Vision/History: Wears glasses Wears Glasses: Reading only Patient Visual Report: No change from baseline Vision Assessment?: Yes Eye Alignment: Within Functional Limits Ocular Range of Motion: Within Functional Limits Alignment/Gaze Preference: Within Defined Limits Tracking/Visual Pursuits: Decreased smoothness of vertical tracking;Decreased smoothness of horizontal tracking;Other (comment) (Pt with jerky tracking throughout with lost fixation in multple areas but no spectific pattern noted) Convergence: Impaired (comment) Visual Fields: No apparent deficits (With gross testing no definite areas of impairment but functionally pt at times missing objects in both the right and left inferior quadrants.) Perception  Perception: Impaired Spatial Orientation: Pt gets close to objects on the right side during mobility and when attempting to avoid obstacles Praxis Praxis: Intact Cognition Overall Cognitive Status: History of cognitive impairments - at baseline Arousal/Alertness: Awake/alert Orientation Level: Oriented X4 Attention: Sustained;Selective Sustained Attention: Appears intact Selective Attention: Impaired Selective Attention Impairment: Functional basic Memory:  Impaired Memory  Impairment: Storage deficit;Decreased recall of new information Awareness Impairment: Anticipatory impairment Problem Solving: Impaired Problem Solving Impairment: Functional basic Safety/Judgment: Impaired Comments: Pt with decreased ability to sequence simple 28 piece puzzle.  Unable to follow verbal instructions without cueing when completing 9 hole peg test.   Sensation Sensation Light Touch: Appears Intact Stereognosis: Appears Intact Hot/Cold: Appears Intact Proprioception: Appears Intact Coordination Gross Motor Movements are Fluid and Coordinated: Yes Fine Motor Movements are Fluid and Coordinated: No Finger Nose Finger Test: increased time needed for fastening buttons 9 Hole Peg Test: 38 seconds on the right and 51 on the left Motor  Motor Motor: Hemiplegia Motor - Discharge Observations: Very minimal right hemiparesis noted with decreased FM coordination present.   Mobility  Transfers Transfers: Sit to Stand;Stand to Sit Sit to Stand: 6: Modified independent (Device/Increase time);With armrests;With upper extremity assist Stand to Sit: 6: Modified independent (Device/Increase time);With armrests;With upper extremity assist  Trunk/Postural Assessment  Cervical Assessment Cervical Assessment: Within Functional Limits Thoracic Assessment Thoracic Assessment: Exceptions to [facility] (thoracic kyphosis) Lumbar Assessment Lumbar Assessment: Exceptions to [facility] (Lumbar kyphosis) Postural Control Postural Control: Within Functional Limits  Balance Balance Balance Assessed: Yes Static Sitting Balance Static Sitting - Balance Support: No upper extremity supported Static Sitting - Level of Assistance: 7: Independent Dynamic Sitting Balance Dynamic Sitting - Balance Support: During functional activity Dynamic Sitting - Level of Assistance: 7: Independent Static Standing Balance Static Standing - Balance Support: No upper extremity supported;During functional  activity Static Standing - Level of Assistance: 6: Modified independent (Device/Increase time) Dynamic Standing Balance Dynamic Standing - Balance Support: During functional activity;No upper extremity supported Dynamic Standing - Level of Assistance: 5: Stand by assistance Extremity/Trunk Assessment RUE Assessment RUE Assessment: Within Functional Limits LUE Assessment LUE Assessment: Within Functional Limits (Overall with slight decreased FM coordination compared to the right but uses functionally without assist during ADLs. )   See Function Navigator for Current Functional Status.  Anda Sobotta OTR/L 10/19/2016, 4:33 PM

## 2016-10-19 NOTE — Progress Notes (Signed)
Physical Therapy Session Note  Patient Details  Name: Susan Patel MRN: [id number] Date of Birth: 02-08-1941  Today's Date: 10/19/2016 PT Individual Time: 1016-1115 PT Individual Time Calculation (min): 59 min   Short Term Goals: Week 1:  PT Short Term Goal 1 (Week 1): STG = LTG due to short ELOS.  Skilled Therapeutic Interventions/Progress Updates:    Pt in bed upon arrival, agreeable to PT session. Spouse present initially during session but leaving to run errands. Spouse denies having any questions or concerns. Reinforced with pt and spouse recommendation for supervision for mobility. Pt ambulating variable distances throughout session without device and supervision. Ambulation in small spaces to replicate home situation performed. Up/down 12 steps as well. Transfers performed with supervision including from couch level. Berg completed with score of 44/56 (37/56 5/30)  At end of session, pt returned to bed with all needs in reach. Bed alarm on.   Therapy Documentation Precautions:  Precautions Precautions: Fall Precaution Comments: full supervision for eating, puree diet, honey thick liquids Restrictions Weight Bearing Restrictions: No Pain: Pain Assessment Pain Assessment: No/denies pain  See Function Navigator for Current Functional Status.   Therapy/Group: Individual Therapy  Linard Millers, PT 10/19/2016, 4:07 PM

## 2016-10-19 NOTE — Progress Notes (Signed)
Modified Barium Swallow Progress Note  Patient Details  Name: Susan Patel MRN: [id number] Date of Birth: [birth year]  Today's Date: 10/19/2016  Modified Barium Swallow completed.  Full report located under Chart Review in the Imaging Section.  Brief recommendations include the following:  Clinical Impression      Pt presents with slight improvements in swallowing function in comparison to initial evaluation.  Pt with trace deep silent penetration with initial cup sip of honey thick liquids which cleared spontaneously with second bolus.  Cough was ineffective for clearing penetrates from the airway.  Pt exhibited either deep penetration or aspiration which did not clear from the airway with nectar thick liquids and thin liquids.    While pt did not significantly hold boluses orally, pt does continue to have very delayed response in swallow initiation due to premature spillage of materials into the pharynx with complete filling of the pyriforms before pt is able to initiate a swallow (max assist verbal cues).  All aspiration and penetration was silent until materials were well below the level of the cords.   A chin tuck was attempted to facilitate improved bolus cohesion but was ineffective for maximizing airway protection.   Recommend advancing pt to honey thick liquids via small, controlled cup sips with full supervision for use of precautions.  Husband was educated regarding recommendations and instructed to resume use of thickened liquids via teaspoon should pt begin coughing with intake.        Swallow Evaluation Recommendations       SLP Diet Recommendations: Dysphagia 1 (Puree) solids;Honey thick liquids   Liquid Administration via: Cup   Medication Administration: Crushed with puree   Supervision: Patient able to self feed;Full supervision/cueing for compensatory strategies   Compensations: Slow rate;Small sips/bites;Other (Comment) (verbal cues to initiate swallow)   Postural  Changes: Remain semi-upright after after feeds/meals (Comment);Seated upright at 90 degrees            Kyser Wandel, Elmyra Ricks L 10/19/2016,12:24 PM

## 2016-10-19 NOTE — Progress Notes (Signed)
Physical Therapy Discharge Summary  Patient Details  Name: Susan Patel MRN: [id number] Date of Birth: 1940-06-16  Today's Date: 10/19/2016 PT Individual Time: 1016-1115 PT Individual Time Calculation (min): 59 min    Patient has met 3 of 9 long term goals due to improved activity tolerance, improved balance, increased strength, ability to compensate for deficits, improved awareness and improved coordination.  Patient to discharge at an ambulatory level Supervision.   Patient's care partner is independent to provide the necessary physical assistance at discharge.  Reasons goals not met: Pt initially anticipated to progress to modified independent level with mobility but due to safety considerations, pt is at supervision level at D/C.   Recommendation:  Patient will benefit from ongoing skilled PT services in home health setting to continue to advance safe functional mobility, address ongoing impairments in balance, coordination, safety awareness, general functional mobility, and minimizing fall risk.  Equipment: No equipment provided  Reasons for discharge: treatment goals met  Patient/family agrees with progress made and goals achieved: Yes   PT Discharge Precautions/Restrictions Precautions Precautions: Fall Precaution Comments: full supervision for eating, puree diet, honey thick liquids Restrictions Weight Bearing Restrictions: No  Pain Pain Assessment Pain Assessment: No/denies pain Vision/Perception  Vision - History Baseline Vision: No visual deficits Patient Visual Report: Blurring of vision Vision - Assessment Eye Alignment: Within Functional Limits Praxis Praxis: Intact  Cognition Overall Cognitive Status: History of cognitive impairments - at baseline Arousal/Alertness: Awake/alert Orientation Level: Oriented X4 Attention: Sustained;Selective Sustained Attention: Appears intact Selective Attention: Impaired Safety/Judgment: Impaired Comments: cues needed  for safety intermittently during session.  Sensation Sensation Light Touch: Appears Intact (reports having neuropathy but denies loss of sensation) Proprioception: Appears Intact Coordination Gross Motor Movements are Fluid and Coordinated: Yes Fine Motor Movements are Fluid and Coordinated: Yes Finger Nose Finger Test: accurate Heel Shin Test: decreased range but accurate placement and control Motor  Motor Motor: Hemiplegia   Balance Berg Balance Test Sit to Stand: Able to stand without using hands and stabilize independently Standing Unsupported: Able to stand safely 2 minutes Sitting with Back Unsupported but Feet Supported on Floor or Stool: Able to sit safely and securely 2 minutes Stand to Sit: Sits safely with minimal use of hands Transfers: Able to transfer safely, minor use of hands Standing Unsupported with Eyes Closed: Able to stand 10 seconds safely Standing Ubsupported with Feet Together: Able to place feet together independently and stand for 1 minute with supervision From Standing, Reach Forward with Outstretched Arm: Can reach forward >12 cm safely (5") From Standing Position, Pick up Object from Floor: Able to pick up shoe, needs supervision From Standing Position, Turn to Look Behind Over each Shoulder: Looks behind from both sides and weight shifts well Turn 360 Degrees: Able to turn 360 degrees safely in 4 seconds or less Standing Unsupported, Alternately Place Feet on Step/Stool: Able to complete >2 steps/needs minimal assist Standing Unsupported, One Foot in Front: Able to take small step independently and hold 30 seconds Standing on One Leg: Unable to try or needs assist to prevent fall Total Score: 44 Static Sitting Balance Static Sitting - Balance Support: No upper extremity supported Static Sitting - Level of Assistance: 7: Independent Dynamic Sitting Balance Dynamic Sitting - Balance Support: During functional activity Dynamic Sitting - Level of  Assistance: 7: Independent Static Standing Balance Static Standing - Balance Support: No upper extremity supported;During functional activity Static Standing - Level of Assistance: 5: Stand by assistance Dynamic Standing Balance Dynamic Standing - Balance  Support: During functional activity;No upper extremity supported Dynamic Standing - Level of Assistance: 5: Stand by assistance Extremity Assessment  RUE Assessment RUE Assessment: Within Functional Limits LUE Assessment LUE Assessment: Within Functional Limits RLE Assessment RLE Assessment: Within Functional Limits LLE Strength LLE Overall Strength Comments: hip flexion 4/5, knee extension 4+/5, knee flexion 4+/5, dorsiflexion 5/5, plantarflexion 5/5.    See Function Navigator for Current Functional Status.  Linard Millers, PT 10/19/2016, 1:09 PM

## 2016-10-19 NOTE — Progress Notes (Signed)
[facility] PHYSICAL MEDICINE & REHABILITATION     PROGRESS NOTE  Subjective/Complaints:  Pt seen laying in bed this AM.  Husband at bedside. He indicates he would like to take patient home.   ROS: Denies CP, SOB, N/V/D.  Objective: Vital Signs: Blood pressure 113/79, pulse 79, temperature 98.1 F (36.7 C), temperature source Oral, resp. rate 18, height 5\' 1"  (1.549 m), weight 84 kg (185 lb 1.6 oz), SpO2 100 %. No results found.  Recent Labs  10/18/16 0720  WBC 6.4  HGB 11.7*  HCT 36.9  PLT 291    Recent Labs  10/18/16 0720 10/19/16 0444  NA 140 140  K 3.7 3.6  CL 103 104  GLUCOSE 104* 102*  BUN 16 16  CREATININE 1.03* 1.01*  CALCIUM 8.9 9.0   CBG (last 3)   Recent Labs  10/18/16 1628 10/18/16 2125 10/19/16 0553  GLUCAP 98 96 100*    Wt Readings from Last 3 Encounters:  10/19/16 84 kg (185 lb 1.6 oz)  10/10/16 88.1 kg (194 lb 4.8 oz)  09/15/16 87.6 kg (193 lb 3.2 oz)    Physical Exam:  BP 113/79 (BP Location: Left Arm)   Pulse 79   Temp 98.1 F (36.7 C) (Oral)   Resp 18   Ht 5\' 1"  (1.549 m)   Wt 84 kg (185 lb 1.6 oz)   SpO2 100%   BMI 34.97 kg/m  Constitutional: She appears well-developed and well-nourished. NAD HENT: Normocephalic and atraumatic.  Eyes: EOMI. No discharge.  Cardiovascular: RRR. No JVD. Respiratory: normal effort.  Clear.  GI: Soft. Bowel sounds are normal.   Musculoskeletal: She exhibits no edema or tenderness.  Neurological: She is alert and oriented. A cranial nerve deficit is present.  Dyssarthria.  Right facial weakness.   Motor  B/l UE 4+/5 prox to distal.  B/l LE: 4/5 prox to distal (improving).  Mild LUE dysmetria Sensation intact   Skin: Skin is warm and dry.  Psychiatric: Flat affect.   Assessment/Plan: 1. Functional deficits secondary to right basal ganglia /parietal infarcts which require 3+ hours per day of interdisciplinary therapy in a comprehensive inpatient rehab setting. Physiatrist is providing close  team supervision and 24 hour management of active medical problems listed below. Physiatrist and rehab team continue to assess barriers to discharge/monitor patient progress toward functional and medical goals.  Function:  Bathing Bathing position   Position: Shower  Bathing parts Body parts bathed by patient: Right arm, Left arm, Chest, Abdomen, Front perineal area, Buttocks, Right upper leg, Left upper leg, Right lower leg, Left lower leg, Back    Bathing assist Assist Level: Supervision or verbal cues (per husbands report, showered with his supervision)      Upper Body Dressing/Undressing Upper body dressing   What is the patient wearing?: Pull over shirt/dress     Pull over shirt/dress - Perfomed by patient: Thread/unthread right sleeve, Thread/unthread left sleeve, Put head through opening, Pull shirt over trunk          Upper body assist Assist Level: Set up      Lower Body Dressing/Undressing Lower body dressing   What is the patient wearing?: Socks, Shoes             Socks - Performed by patient: Don/doff right sock, Don/doff left sock   Shoes - Performed by patient: Don/doff right shoe, Don/doff left shoe            Lower body assist Assist for lower body dressing: Touching  or steadying assistance (Pt > 75%)      Toileting Toileting   Toileting steps completed by patient: Adjust clothing prior to toileting, Performs perineal hygiene, Adjust clothing after toileting   Toileting Assistive Devices: Grab bar or rail  Toileting assist Assist level: Supervision or verbal cues   Transfers Chair/bed transfer   Chair/bed transfer method: Ambulatory Chair/bed transfer assist level: Supervision or verbal cues Chair/bed transfer assistive device: Armrests     Locomotion Ambulation     Max distance: 400 ft Assist level: Supervision or verbal cues   Wheelchair Wheelchair activity did not occur: N/A        Cognition Comprehension Comprehension assist  level: Understands complex 90% of the time/cues 10% of the time  Expression Expression assist level: Expresses basic 25 - 49% of the time/requires cueing 50 - 75% of the time. Uses single words/gestures.  Social Interaction Social Interaction assist level: Interacts appropriately with others with medication or extra time (anti-anxiety, antidepressant).  Problem Solving Problem solving assist level: Solves basic problems with no assist  Memory Memory assist level: Recognizes or recalls 90% of the time/requires cueing < 10% of the time    Medical Problem List and Plan: 1.  Functional and swallowing deficits secondary to right basal ganglia /parietal infarcts on 5/26  Cont CIR, plan for d/c tomorrow 2.  DVT Prophylaxis/Anticoagulation: Pharmaceutical: Lovenox 3. Chronic pain/Pain Management: continue tramadol prn.  Resumed Neurontin as indicating increase pain in hands/feet.  4. Mood: LCSW to follow for evaluation and support.  5. Neuropsych: This patient is not fully capable of making decisions on her own behalf. 6. Skin/Wound Care: routine pressure relief measures.  7. Fluids/Electrolytes/Nutrition: Monitor I/Os.   Encourage fluid intake during the day.   IVF qhs d/ced  Will continue to monitor for now.   Labs ordered for tomorrow, Cr stable today 8. T2DM: Hgb A1c- 6.3.   Use SSI for elevated BS and titrated meds as needed for tighter control.   Amaryl d/ced   Controlled 6/5 9. COPD: Uses 3 L oxygen at nights.  10. Insomnia: continue trazodone prn.  11. Constipation: Resumed linzess.  12. Dyslipidemia : On Crestor.  13. COPD: Resumed Breo. Continue 3 L oxygen at nights.  14. HTN: Monitor BP bid  Continue hold HCTZ  Losartan 12.5 started 6/4  Improving 15. Dysphagia: On dysphagia 1, honey liquids by tsp.   IVF d/ced  Plan for MBS today to advance liquids in anticipation for discharge 16. Hypokalemia  K+ 3.6 on 6/5  Supplementing 17. Hypoalbuminemia  Supplement initiated  5/30 18. ABLA  Hb 11.7 on 6/4  Cont to monitor  LOS (Days) 7 A FACE TO FACE EVALUATION WAS PERFORMED  Dwan Hemmelgarn Lorie Phenix 10/19/2016 8:25 AM

## 2016-10-19 NOTE — Progress Notes (Signed)
Social Work  Discharge Note  The overall goal for the admission was met for:   Discharge location: Yes-HOME WITH HUSBAND WHO CAN PROVIDE 24 HR SUPERVISION LEVEL  Length of Stay: Yes-8 DAYS  Discharge activity level: Yes-SUPERVISION LEVEL  Home/community participation: Yes  Services provided included: MD, RD, PT, OT, SLP, RN, CM, Pharmacy and [place]: Medicare and Private Insurance: [facility]  Follow-up services arranged: Outpatient: [facility]-PT,OT,SP 6/11 12:45-2;30 PM WORK ON SP APPOINTMENT  Comments (or additional information):HUSBAND IS HERE DAILY AND PARTICIPATES IN THERAPIES WITH PT, EDUCATED ON SWALLOW ISSUES. PREFER OP AT [facility] SINCE WAS JUST THERE AFTER LAST STROKE. NO EQUIPMENT NEEDS, HAS ALL FROM PREVIOUS ADMITS.  Patient/Family verbalized understanding of follow-up arrangements: Yes  Individual responsible for coordination of the follow-up plan: WAYNE-HUSBAND  Confirmed correct DME delivered: Elease Hashimoto 10/19/2016    Elease Hashimoto

## 2016-10-20 ENCOUNTER — Other Ambulatory Visit: Payer: Self-pay | Admitting: Pediatrics

## 2016-10-20 DIAGNOSIS — G8929 Other chronic pain: Secondary | ICD-10-CM

## 2016-10-20 LAB — BASIC METABOLIC PANEL
ANION GAP: 8 (ref 5–15)
BUN: 15 mg/dL (ref 6–20)
CALCIUM: 9.3 mg/dL (ref 8.9–10.3)
CO2: 28 mmol/L (ref 22–32)
Chloride: 105 mmol/L (ref 101–111)
Creatinine, Ser: 1 mg/dL (ref 0.44–1.00)
GFR calc Af Amer: >60 mL/min (ref >60)
GFR calc non Af Amer: 55 mL/min — ABNORMAL LOW (ref >60)
GLUCOSE: 141 mg/dL — AB (ref 65–99)
Potassium: 3.7 mmol/L (ref 3.5–5.1)
Sodium: 141 mmol/L (ref 135–145)

## 2016-10-20 LAB — GLUCOSE, CAPILLARY: Glucose-Capillary: 95 mg/dL (ref 65–99)

## 2016-10-20 MED ORDER — PANTOPRAZOLE SODIUM 40 MG PO TBEC: 40.0000 mg | DELAYED_RELEASE_TABLET | Freq: Every day | ORAL | 1 refills | Status: DC | PRN

## 2016-10-20 MED ORDER — CLOPIDOGREL BISULFATE 75 MG PO TABS: 75.0000 mg | ORAL_TABLET | Freq: Every day | ORAL | 0 refills | Status: DC

## 2016-10-20 MED ORDER — ACETAMINOPHEN 500 MG PO TABS: 500.0000 mg | ORAL_TABLET | Freq: Three times a day (TID) | ORAL | 0 refills | Status: DC | PRN

## 2016-10-20 MED ORDER — LOSARTAN POTASSIUM 25 MG PO TABS: 12.5000 mg | ORAL_TABLET | Freq: Every day | ORAL | 0 refills | Status: DC

## 2016-10-20 MED ORDER — RESOURCE THICKENUP CLEAR PO POWD: 1.0000 g | ORAL | 1 refills | Status: DC | PRN

## 2016-10-20 MED ORDER — FAMOTIDINE 20 MG PO TABS
20.0000 mg | ORAL_TABLET | Freq: Two times a day (BID) | ORAL | 0 refills | Status: DC
Start: 2016-10-20 — End: 2016-11-16

## 2016-10-20 NOTE — Progress Notes (Signed)
[place] PHYSICAL MEDICINE & REHABILITATION     PROGRESS NOTE  Subjective/Complaints:  Pt seen laying in bed this AM. She slept well overnight.  She wants breakfast before being discharge.   ROS: Denies CP, SOB, N/V/D.  Objective: Vital Signs: Blood pressure (!) 112/93, pulse 69, temperature 97.7 F (36.5 C), temperature source Oral, resp. rate 17, height 5\' 1"  (1.549 m), weight 83.9 kg (185 lb 0.9 oz), SpO2 98 %. Dg Swallowing Func-speech Pathology  Result Date: 10/19/2016 Objective Swallowing Evaluation: Type of Study: MBS-Modified Barium Swallow Study Patient Details Name: Susan Patel MRN: [id number] Date of Birth: [birth year] Today's Date: 10/19/2016 Time: SLP Start Time: 0904 -SLP Stop Time: 0929 SLP Time Calculation (min): 25 min Past Medical History: Past Medical History: Diagnosis Date . Arthritis   'all over" . Asthma  . Chronic pain  . COPD (chronic obstructive pulmonary disease) (HCC)   O2 per [place] at nights  . Depression  . Diabetes mellitus without complication ([place])   borderline- , states she was on med., but MD told her "everything is under control so I threw the bottle away" . Fibromyalgia  . GERD (gastroesophageal reflux disease)  . Hypertension  . Neuromuscular disorder (HCC)   parkinson, neuropathy- both feet & hands. . 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 ABDOMINAL HYSTERECTOMY   . ANTERIOR CERVICAL DECOMP/DISCECTOMY FUSION N/A 07/10/2015  Procedure: Cervical five-six, Cervical six-seven anterior cervical decompression with fusion plating and bonegraft;  Surgeon: Jovita Gamma, MD;  Location: [place] NEURO ORS;  Service: Neurosurgery;  Laterality: N/A; . APPENDECTOMY   . BIOPSY EYE MUSCLE  03/20/2016  biopsy vessel to right eye due to swelling . EYE SURGERY Bilateral   cataracts removed, /w "cyrstal lenses"  . 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 Pt with CIR, OP therapy d/c from OP 05/31/16 on regular diet with thin liquids. Admitted 10/09/16, CT shows suspected acute right basal ganglia infarct, new left basal ganglia lacunar infarct, possibly subacute right occipital infarct.  Repeat MBS ordered today to determine readiness for diet advancement Subjective: alert, significantly dysarthric Assessment / Plan / Recommendation CHL IP CLINICAL IMPRESSIONS 10/19/2016 Clinical Impression Pt presents with slight improvements in swallowing function in comparison to initial evaluation.  Pt with trace deep silent penetration with initial cup sip of honey thick liquids which cleared spontaneously with second bolus.  Cough was ineffective for clearing penetrates from the airway.  Pt exhibited either deep penetration or aspiration which did not clear from the airway with nectar thick liquids and thin liquids.    While pt did not significantly hold boluses orally, pt does continue to have very delayed response in swallow initiation due to premature spillage of materials into the pharynx with complete filling of the pyriforms before pt is able to initiate a swallow (max assist verbal cues).  All aspiration and penetration was silent until materials were well below the level of the cords.   A chin tuck was attempted to facilitate improved bolus cohesion but was ineffective for maximizing airway protection.   Recommend advancing pt to honey thick liquids via small, controlled cup sips with full supervision for use of precautions.  Husband was educated regarding recommendations and instructed to resume use of thickened liquids via teaspoon should pt begin coughing with intake.   SLP Visit Diagnosis Dysphagia,  oropharyngeal phase (R13.12);Cognitive communication deficit (R41.841)     Impact on safety and function Moderate aspiration risk     Prognosis 10/19/2016 Prognosis for Safe Diet Advancement Good Barriers to Reach Goals -- Barriers/Prognosis Comment -- CHL IP DIET RECOMMENDATION 10/19/2016 SLP Diet Recommendations Dysphagia 1 (Puree) solids;Honey thick liquids Liquid Administration via Cup Medication Administration Crushed with puree Compensations Slow rate;Small sips/bites;Other (Comment) Postural Changes Remain semi-upright after after feeds/meals (Comment);Seated upright at 90 degrees   CHL IP OTHER RECOMMENDATIONS 10/11/2016 Recommended Consults -- Oral Care Recommendations Oral care BID Other Recommendations Order thickener from pharmacy;Prohibited food (jello, ice cream, thin soups);Remove water pitcher;Have oral suction available          CHL IP ORAL PHASE 10/19/2016 Oral Phase Impaired Oral - Pudding Teaspoon -- Oral - Pudding Cup -- Oral - Honey Teaspoon -- Oral - Honey Cup Decreased bolus cohesion;Premature spillage;Lingual/palatal residue Oral - Nectar Teaspoon -- Oral - Nectar Cup Decreased bolus cohesion;Premature spillage;Lingual/palatal residue Oral - Nectar Straw -- Oral - Thin Teaspoon Lingual/palatal residue;Decreased bolus cohesion;Premature spillage Oral - Thin Cup Lingual/palatal residue;Decreased bolus cohesion;Premature spillage Oral - Thin Straw -- Oral - Puree Lingual/palatal residue Oral - Mech Soft Decreased bolus cohesion;Premature spillage;Lingual/palatal residue Oral - Regular -- Oral - Multi-Consistency -- Oral - Pill -- Oral Phase - Comment --  CHL IP PHARYNGEAL PHASE 10/19/2016 Pharyngeal Phase Impaired Pharyngeal- Pudding Teaspoon -- Pharyngeal -- Pharyngeal- Pudding Cup -- Pharyngeal -- Pharyngeal- Honey Teaspoon -- Pharyngeal -- Pharyngeal- Honey Cup Penetration/Aspiration during swallow Pharyngeal Material enters airway, CONTACTS cords and then ejected out Pharyngeal- Nectar Teaspoon --  Pharyngeal -- Pharyngeal- Nectar Cup Penetration/Aspiration during swallow;Delayed swallow initiation-pyriform sinuses Pharyngeal Material enters airway, passes BELOW cords without attempt by patient to eject out (silent aspiration) Pharyngeal- Nectar Straw -- Pharyngeal -- Pharyngeal- Thin Teaspoon Delayed swallow initiation-pyriform sinuses;Penetration/Aspiration during swallow Pharyngeal Material enters airway, passes BELOW cords without attempt by patient to eject out (silent aspiration) Pharyngeal- Thin Cup Delayed swallow initiation-pyriform sinuses;Penetration/Aspiration during swallow Pharyngeal Material enters airway, passes BELOW cords without attempt by patient to eject out (silent aspiration) Pharyngeal- Thin Straw -- Pharyngeal -- Pharyngeal- Puree WFL Pharyngeal -- Pharyngeal- Mechanical Soft Pharyngeal residue - pyriform;Penetration/Aspiration during swallow Pharyngeal Material enters airway, passes BELOW cords and not ejected out despite cough attempt by patient Pharyngeal- Regular -- Pharyngeal -- Pharyngeal- Multi-consistency -- Pharyngeal -- Pharyngeal- Pill -- Pharyngeal -- Pharyngeal Comment --  CHL IP CERVICAL ESOPHAGEAL PHASE 10/19/2016 Cervical Esophageal Phase WFL Pudding Teaspoon -- Pudding Cup -- Honey Teaspoon -- Honey Cup -- Nectar Teaspoon -- Nectar Cup -- Nectar Straw -- Thin Teaspoon -- Thin Cup -- Thin Straw -- Puree -- Mechanical Soft -- Regular -- Multi-consistency -- Pill -- Cervical Esophageal Comment -- Emilio Math 10/19/2016, 10:22 AM               Recent Labs  10/18/16 0720  WBC 6.4  HGB 11.7*  HCT 36.9  PLT 291    Recent Labs  10/18/16 0720 10/19/16 0444  NA 140 140  K 3.7 3.6  CL 103 104  GLUCOSE 104* 102*  BUN 16 16  CREATININE 1.03* 1.01*  CALCIUM 8.9 9.0   CBG (last 3)   Recent Labs  10/19/16 1652 10/19/16 2045 10/20/16 0620  GLUCAP 74 106* 95    Wt Readings from Last 3 Encounters:  10/20/16 83.9 kg (185 lb 0.9 oz)  10/10/16 88.1 kg (194  lb 4.8 oz)  09/15/16 87.6 kg (193 lb 3.2 oz)    Physical Exam:  BP (!) 112/93 (BP Location: Left Arm)   Pulse 69   Temp 97.7 F (36.5 C) (Oral)   Resp 17   Ht 5\' 1"  (1.549 m)   Wt 83.9 kg (185 lb 0.9 oz)   SpO2 98%   BMI 34.97 kg/m  Constitutional: She appears well-developed and well-nourished. NAD HENT: Normocephalic and atraumatic.  Eyes: EOMI. No discharge.  Cardiovascular: RRR. No JVD. Respiratory: normal effort.  Clear.  GI: Soft. Bowel sounds are normal.   Musculoskeletal: She exhibits no edema or tenderness.  Neurological: She is alert and oriented. A cranial nerve deficit is present.  Dyssarthria.  Right facial weakness.   Motor  B/l UE 4+/5 prox to distal.  B/l LE: 4/5 prox to distal (improving).  Mild LUE dysmetria Sensation intact   Skin: Skin is warm and dry.  Psychiatric: Flat affect.   Assessment/Plan: 1. Functional deficits secondary to right basal ganglia /parietal infarcts which require 3+ hours per day of interdisciplinary therapy in a comprehensive inpatient rehab setting. Physiatrist is providing close team supervision and 24 hour management of active medical problems listed below. Physiatrist and rehab team continue to assess barriers to discharge/monitor patient progress toward functional and medical goals.  Function:  Bathing Bathing position   Position: Shower  Bathing parts Body parts bathed by patient: Right arm, Left arm, Chest, Abdomen, Front perineal area, Buttocks, Right upper leg, Left upper leg, Right lower leg, Left lower leg, Back    Bathing assist Assist Level: Supervision or verbal cues      Upper Body Dressing/Undressing Upper body dressing   What is the patient wearing?: Pull over shirt/dress, Bra Bra - Perfomed by patient: Thread/unthread right bra strap, Thread/unthread left bra strap, Hook/unhook bra (pull down sports bra)   Pull over shirt/dress - Perfomed by patient: Thread/unthread right sleeve, Thread/unthread left  sleeve, Put head through opening, Pull shirt over trunk          Upper body assist Assist Level: More than reasonable time      Lower Body Dressing/Undressing Lower body dressing   What is the patient wearing?: Shoes, Underwear, Pants Underwear - Performed by patient: Thread/unthread right underwear leg, Thread/unthread left underwear leg, Pull underwear up/down   Pants- Performed by patient: Thread/unthread right pants leg, Thread/unthread left pants leg, Pull pants up/down       Socks - Performed by patient: Don/doff right sock, Don/doff left sock   Shoes - Performed by patient: Don/doff right shoe, Don/doff left shoe            Lower body assist Assist for lower body dressing: More than reasonable time      Toileting Toileting   Toileting steps completed by patient: Adjust clothing prior to toileting, Performs perineal hygiene, Adjust clothing after toileting   Toileting Assistive Devices: Grab bar or rail  Toileting assist Assist level: More than reasonable time   Transfers Chair/bed transfer   Chair/bed transfer method: Ambulatory Chair/bed transfer assist level: Supervision or verbal cues Chair/bed transfer assistive device: Armrests     Locomotion Ambulation     Max distance: 400 ft Assist level: Supervision or verbal cues   Wheelchair Wheelchair activity did not occur: N/A        Cognition Comprehension Comprehension assist level: Understands basic 75 - 89% of the time/ requires cueing 10 -  24% of the time  Expression Expression assist level: Expresses basic 25 - 49% of the time/requires cueing 50 - 75% of the time. Uses single words/gestures.  Social Interaction Social Interaction assist level: Interacts appropriately 75 - 89% of the time - Needs redirection for appropriate language or to initiate interaction.  Problem Solving Problem solving assist level: Solves basic 50 - 74% of the time/requires cueing 25 - 49% of the time  Memory Memory assist  level: Recognizes or recalls 50 - 74% of the time/requires cueing 25 - 49% of the time    Medical Problem List and Plan: 1.  Functional and swallowing deficits secondary to right basal ganglia /parietal infarcts on 5/26  D/c today  Will see patient in 1-2 weeks for transitional care management 2.  DVT Prophylaxis/Anticoagulation: Pharmaceutical: Lovenox 3. Chronic pain/Pain Management: continue tramadol prn.  Resumed Neurontin as indicating increase pain in hands/feet.  4. Mood: LCSW to follow for evaluation and support.  5. Neuropsych: This patient is not fully capable of making decisions on her own behalf. 6. Skin/Wound Care: routine pressure relief measures.  7. Fluids/Electrolytes/Nutrition: Monitor I/Os.   Encourage fluid intake during the day.   IVF qhs d/ced  Will continue to monitor for now.   Labs ordered, will d/c pending labs 8. T2DM: Hgb A1c- 6.3.   Use SSI for elevated BS and titrated meds as needed for tighter control.   Amaryl d/ced   Controlled 6/6 9. COPD: Uses 3 L oxygen at nights.  10. Insomnia: continue trazodone prn.  11. Constipation: Resumed linzess.  12. Dyslipidemia : On Crestor.  13. COPD: Resumed Breo. Continue 3 L oxygen at nights.  14. HTN: Monitor BP bid  Continue hold HCTZ  Losartan 12.5 started 6/4  Overall controlled 6/6 15. Dysphagia: On dysphagia 1, honey liquids.   IVF d/ced  MBS 6/5, still on honey liquids, but able to take through cup now 16. Hypokalemia  K+ 3.6 on 6/5  Supplementing 17. Hypoalbuminemia  Supplement initiated 5/30 18. ABLA  Hb 11.7 on 6/4  Cont to monitor  LOS (Days) 8 A FACE TO FACE EVALUATION WAS PERFORMED  Yazmin Locher Lorie Phenix 10/20/2016 8:25 AM

## 2016-10-20 NOTE — Discharge Instructions (Signed)
Inpatient Rehab Discharge Instructions  Susan Patel Discharge date and time:  10/20/16  Activities/Precautions/ Functional Status: Activity: no lifting, driving, or strenuous exercise  till cleared by MD Diet: cardiac diet and diabetic diet. Pureed foods- Honey thick liquids.  Wound Care: none needed   Functional status:  ___ No restrictions     ___ Walk up steps independently _X__ 24/7 supervision/assistance   ___ Walk up steps with assistance ___ Intermittent supervision/assistance  ___ Bathe/dress independently ___ Walk with walker     _X__ Bathe/dress with supervision.  ___ Walk Independently    ___ Shower independently ___ Walk with assistance    ___ Shower with assistance _X__ No alcohol     ___ Return to work/school ________  Special Instructions:    COMMUNITY REFERRALS UPON DISCHARGE:    Outpatient: PT, OT, SP  Agency:[facility] Phone:[phone]   Date of Last Service:10/20/2016  Appointment Date/Time:JUNE 11 Monday 12:45-2:30 PM WORK ON SPEECH APPOINTMENT  Medical Equipment/Items Ordered:HAS ALL NEEDED EQUIPMENT FROM PREVIOUS ADMISSIONS    GENERAL COMMUNITY RESOURCES FOR PATIENT/FAMILY: Support Groups:[facility] SUPPORT GROUP EVERY SECOND Thursday ( SEPT-MAY) @ 3:00-4:00 PM ON THE REHAB UNIT QUESTIONS CONTACT [place] [phone]  STROKE/TIA DISCHARGE INSTRUCTIONS SMOKING Cigarette smoking nearly doubles your risk of having a stroke & is the single most alterable risk factor  If you smoke or have smoked in the last 12 months, you are advised to quit smoking for your health.  Most of the excess cardiovascular risk related to smoking disappears within a year of stopping.  Ask you doctor about anti-smoking medications  [place] Quit Line: 1-800-QUIT NOW  Free Smoking Cessation Classes [phone]  CHOLESTEROL Know your levels; limit fat & cholesterol in your diet  Lipid Panel     Component Value Date/Time   CHOL 154 10/10/2016 0141   CHOL 175  09/15/2016 1723   TRIG 104 10/10/2016 0141   HDL 43 10/10/2016 0141   HDL 44 09/15/2016 1723   CHOLHDL 3.6 10/10/2016 0141   VLDL 21 10/10/2016 0141   LDLCALC 90 10/10/2016 0141   LDLCALC 97 09/15/2016 1723      Many patients benefit from treatment even if their cholesterol is at goal.  Goal: Total Cholesterol (CHOL) less than 160  Goal:  Triglycerides (TRIG) less than 150  Goal:  HDL greater than 40  Goal:  LDL (LDLCALC) less than 100   BLOOD PRESSURE American Stroke Association blood pressure target is less that 120/80 mm/Hg  Your discharge blood pressure is:  BP:  (information entered in error (wrong patient))  Monitor your blood pressure  Limit your salt and alcohol intake  Many individuals will require more than one medication for high blood pressure  DIABETES (A1c is a blood sugar average for last 3 months) Goal HGBA1c is under 7% (HBGA1c is blood sugar average for last 3 months)  Diabetes:     Lab Results  Component Value Date   HGBA1C 6.3 (H) 10/10/2016     Your HGBA1c can be lowered with medications, healthy diet, and exercise.  Check your blood sugar as directed by your physician  Call your physician if you experience unexplained or low blood sugars.  PHYSICAL ACTIVITY/REHABILITATION Goal is 30 minutes at least 4 days per week  Activity: No restrictions. Therapies: see above Return to work: N/A  Activity decreases your risk of heart attack and stroke and makes your heart stronger.  It helps control your weight and blood pressure; helps you relax and can improve your  mood.  Participate in a regular exercise program.  Talk with your doctor about the best form of exercise for you (dancing, walking, swimming, cycling).  DIET/WEIGHT Goal is to maintain a healthy weight  Your discharge diet is: DIET - DYS 1 Room service appropriate? Yes; Fluid consistency: Honey Thick  liquids Your height is:  Height: 5\' 1"  (154.9 cm) Your current weight is: Weight: 84.5 kg  (186 lb 3 oz) Your Body Mass Index (BMI) is:  BMI (Calculated): 35.3  Following the type of diet specifically designed for you will help prevent another stroke.  Your goal weight is:  136 lbs  Your goal Body Mass Index (BMI) is 19-24.  Healthy food habits can help reduce 3 risk factors for stroke:  High cholesterol, hypertension, and excess weight.  RESOURCES Stroke/Support Group:  Call [phone]   STROKE EDUCATION PROVIDED/REVIEWED AND GIVEN TO PATIENT Stroke warning signs and symptoms How to activate emergency medical system (call 911). Medications prescribed at discharge. Need for follow-up after discharge. Personal risk factors for stroke. Pneumonia vaccine given:  Flu vaccine given:  My questions have been answered, the writing is legible, and I understand these instructions.  I will adhere to these goals & educational materials that have been provided to me after my discharge from the hospital.      My questions have been answered and I understand these instructions. I will adhere to these goals and the provided educational materials after my discharge from the hospital.  Patient/Caregiver Signature _______________________________ Date __________  Clinician Signature _______________________________________ Date __________  Please bring this form and your medication list with you to all your follow-up doctor's appointments.

## 2016-10-20 NOTE — Progress Notes (Signed)
Pt d/c home with personal belongings.  D/C instructions provided to pt and husband at bedside by Algis Liming, PAC.  Pt and husband vfy understanding of d/c instructions, f/u appts, and pureed diet with honey thick liquids.

## 2016-10-21 ENCOUNTER — Telehealth: Payer: Self-pay | Admitting: *Deleted

## 2016-10-21 ENCOUNTER — Telehealth: Payer: Self-pay | Admitting: Pediatrics

## 2016-10-21 DIAGNOSIS — L309 Dermatitis, unspecified: Secondary | ICD-10-CM

## 2016-10-21 MED ORDER — TRIAMCINOLONE ACETONIDE 0.025 % EX OINT: 1.0000 "application " | TOPICAL_OINTMENT | Freq: Two times a day (BID) | CUTANEOUS | 1 refills | Status: DC

## 2016-10-21 NOTE — Telephone Encounter (Addendum)
Transitional Care call-1st attempt 10:16 am 10/21/16 message left with appt details and request to call office     12:36 pm Susan Patel returned call to office   1. Are you/is patient experiencing any problems since coming home? Are there any questions regarding any aspect of care?No 2. Are there any questions regarding medications administration/dosing? Are meds being taken as prescribed? Patient should review meds with caller to confirm Yes they have all medications 3. Have there been any falls? No 4. Has Home Health been to the house and/or have they contacted you? If not, have you tried to contact them? Can we help you contact them? No HH, she will be going to outpt therapy @ [facility], appt scheduled 5. Are bowels and bladder emptying properly? Are there any unexpected incontinence issues? If applicable, is patient following bowel/bladder programs?No 6. Any fevers, problems with breathing, unexpected pain? No 7. Are there any skin problems or new areas of breakdown? No 8. Has the patient/family member arranged specialty MD follow up (ie cardiology/neurology/renal/surgical/etc)?  Can we help arrange? Appt given to see Dr Posey Pronto 9. Does the patient need any other services or support that we can help arrange? No 10. Are caregivers following through as expected in assisting the patient? Yes 11. Has the patient quit smoking, drinking alcohol, or using drugs as recommended? N/A  Appointment Thursday 10/28/16 @11 :40 arrive by 11:20 to see Dr Posey Pronto [address]

## 2016-10-21 NOTE — Telephone Encounter (Signed)
What is the name of the medication? Tri .106 cream  Have you contacted your pharmacy to request a refill? No. It has been years  Which pharmacy would you like this sent to? [facility]   Patient notified that their request is being sent to the clinical staff for review and that they should receive a call once it is complete. If they do not receive a call within 24 hours they can check with their pharmacy or our office.

## 2016-10-21 NOTE — Telephone Encounter (Signed)
Refill sent to pharmacy.   

## 2016-10-22 ENCOUNTER — Telehealth: Payer: Self-pay | Admitting: Pediatrics

## 2016-10-22 MED ORDER — TRIAMCINOLONE 0.1 % CREAM:EUCERIN CREAM 1:1: 1.0000 "application " | TOPICAL_CREAM | Freq: Two times a day (BID) | CUTANEOUS | 0 refills | Status: DC

## 2016-10-22 NOTE — Telephone Encounter (Signed)
Cream sent to pharmacy.

## 2016-10-25 ENCOUNTER — Ambulatory Visit (HOSPITAL_COMMUNITY): Payer: Medicare Other | Attending: Physical Medicine and Rehabilitation | Admitting: Physical Therapy

## 2016-10-25 ENCOUNTER — Ambulatory Visit (HOSPITAL_COMMUNITY): Payer: Medicare Other

## 2016-10-25 ENCOUNTER — Encounter (HOSPITAL_COMMUNITY): Payer: Self-pay

## 2016-10-25 DIAGNOSIS — R1312 Dysphagia, oropharyngeal phase: Secondary | ICD-10-CM | POA: Diagnosis present

## 2016-10-25 DIAGNOSIS — R29898 Other symptoms and signs involving the musculoskeletal system: Secondary | ICD-10-CM | POA: Diagnosis present

## 2016-10-25 DIAGNOSIS — R41841 Cognitive communication deficit: Secondary | ICD-10-CM | POA: Diagnosis present

## 2016-10-25 DIAGNOSIS — R278 Other lack of coordination: Secondary | ICD-10-CM | POA: Insufficient documentation

## 2016-10-25 DIAGNOSIS — R296 Repeated falls: Secondary | ICD-10-CM | POA: Diagnosis present

## 2016-10-25 DIAGNOSIS — M6281 Muscle weakness (generalized): Secondary | ICD-10-CM | POA: Diagnosis present

## 2016-10-25 DIAGNOSIS — R2681 Unsteadiness on feet: Secondary | ICD-10-CM | POA: Diagnosis not present

## 2016-10-25 DIAGNOSIS — I69822 Dysarthria following other cerebrovascular disease: Secondary | ICD-10-CM | POA: Insufficient documentation

## 2016-10-25 DIAGNOSIS — I69318 Other symptoms and signs involving cognitive functions following cerebral infarction: Secondary | ICD-10-CM | POA: Diagnosis present

## 2016-10-25 NOTE — Therapy (Addendum)
[place], [place], [address] Phone: [phone]   Fax:  [phone]  Occupational Therapy Evaluation  Patient Details  Name: Susan Patel MRN: [id number] Date of Birth: 1940/07/14 Referring Provider: Cathlyn Parsons, PA-C  Encounter Date: 10/25/2016      OT End of Session - 10/25/16 1439    Visit Number 1   Number of Visits 1   Authorization Type Medicare A & B   Authorization Time Period Before 10th visit   Authorization - Visit Number 1   Authorization - Number of Visits 10   OT Start Time 6720   OT Stop Time 1423   OT Time Calculation (min) 35 min   Activity Tolerance Patient tolerated treatment well   Behavior During Therapy [facility] for tasks assessed/performed      Past Medical History:  Diagnosis Date  . Arthritis    'all over"  . Asthma   . Chronic pain   . COPD (chronic obstructive pulmonary disease) (HCC)    O2 per [place] at nights   . Depression   . Diabetes mellitus without complication ([place])    borderline- , states she was on med., but MD told her "everything is under control so I threw the bottle away"  . Fibromyalgia   . GERD (gastroesophageal reflux disease)   . Hypertension   . Neuromuscular disorder (HCC)    parkinson, neuropathy- both feet & hands.  . Stroke [facility])    residual dysarthria    Past Surgical History:  Procedure Laterality Date  . ABDOMINAL HYSTERECTOMY    . ANTERIOR CERVICAL DECOMP/DISCECTOMY FUSION N/A 07/10/2015   Procedure: Cervical five-six, Cervical six-seven anterior cervical decompression with fusion plating and bonegraft;  Surgeon: Jovita Gamma, MD;  Location: [place] [facility];  Service: Neurosurgery;  Laterality: N/A;  . APPENDECTOMY    . BIOPSY EYE MUSCLE  03/20/2016   biopsy vessel to right eye due to swelling  . EYE SURGERY Bilateral    cataracts removed, /w "cyrstal lenses"   . SHOULDER ARTHROSCOPY Right    x2   RCR- spurs removed     There were no  vitals filed for this visit.      Subjective Assessment - 10/25/16 1428    Subjective  S: I like to read.   Pertinent History Patient is a 76 y.o. year old female with recent admission to the hospital on 10/09/16 with inability to speak, left sided weakness and difficulty walking. CT head done revealing suspicion of acute right basal ganglia infarct, subacute right occipital infarct, left basal infarct question age and extensive small vessel disease. She receive tPA and continues on ASA for secondary stroke prevention.  MRI brain done revealing acute infarcts right basal ganglia anterior right parietal white matter with chronic right occipital cortical infarct. Patient was admitted to CIR on 10/12/2016. Pt discharged 10/17/16. Lauraine Rinne PA referred pt to OT for eval and treat.    Patient Stated Goals To prepare meals.   Currently in Pain? No/denies           [facility] OT Assessment - 10/25/16 1351      Assessment   Diagnosis right basal ganglia /parietal infarcts    Referring Provider Cathlyn Parsons, PA-C   Onset Date 10/06/16   Prior Therapy Inpatient 5/29-6/3     Precautions   Precautions None     Restrictions   Weight Bearing Restrictions No     Balance Screen   Has the  patient fallen in the past 6 months No     Home  Environment   Family/patient expects to be discharged to: Private residence   Living Arrangements Spouse/significant other     Prior Function   Level of [place] Retired   Leisure read     ADL   ADL comments Pt reports completing all ADL tasks except meal prep and cleaning. Pt is having aid come in once a wk for 8 hours to do cleaning.     Mobility   Mobility Status Independent     Written Expression   Dominant Hand Right     Vision - History   Baseline Vision Wears glasses only for reading     Cognition   Overall Cognitive Status No family/caregiver present to determine baseline cognitive functioning  Speech  evaluation tomorrow to further assess cognition     Sensation   Light Touch Appears Intact     Coordination   9 Hole Peg Test Right;Left   Right 9 Hole Peg Test 39.39"   Left 9 Hole Peg Test 42.56"   Box and Blocks right 30; left 33     ROM / Strength   AROM / PROM / Strength AROM;PROM;Strength     AROM   Overall AROM  Within functional limits for tasks performed     Strength   Strength Assessment Site Shoulder   Right/Left Shoulder Right;Left   Right Shoulder Flexion 5/5   Right Shoulder ABduction 5/5   Right Shoulder Internal Rotation 5/5   Right Shoulder External Rotation 4/5   Left Shoulder Flexion 5/5   Left Shoulder ABduction 5/5   Left Shoulder Internal Rotation 5/5   Left Shoulder External Rotation 4/5   Right/Left hand Right;Left   Right Hand Gross Grasp Functional   Right Hand Grip (lbs) 35   Right Hand Lateral Pinch 8 lbs   Right Hand 3 Point Pinch 8 lbs   Left Hand Gross Grasp Functional   Left Hand Grip (lbs) 25   Left Hand Lateral Pinch 5 lbs   Left Hand 3 Point Pinch 6 lbs                         OT Education - 10/25/16 1438    Education provided Yes   Education Details Pt provided with HEP to increase shoulder er strength and grip/pinch strength, educated on reasons OT is not needed   Person(s) Educated Patient   Methods Explanation;Handout;Demonstration   Comprehension Verbalized understanding;Returned demonstration          OT Short Term Goals - 10/25/16 1440      OT SHORT TERM GOAL #1   Title Pt will be educated on and independent with HEP to improve shoulder er and grip/pinch strength.   Time 1   Period Days   Status Achieved                  Plan - 10/25/16 1442    Clinical Impression Statement A: Pt is a 76 year old female S/P right basal ganglia /parietal infarcts causing minimally decreased grip, pinch and shoulder er strength. These deficits are not impacting her ability to perform daily tasks. Pt feels  confident completing HEP at home to focus on deficits listed.   Occupational Profile and client history currently impacting functional performance Strong social support at home, motivated, experience with recovery process   Occupational performance deficits (Please refer to evaluation for details):  IADL's;ADL's   Current Impairments/barriers affecting progress: Past history of stroke   Plan P: one time evaluation only with pt completing HEP independently at home   Clinical Decision Making Limited treatment options, no task modification necessary   OT Home Exercise Plan 10/25/16: shoulder external rotation with green theraband, green theraputty exercises   Recommended Other Services Speech   Consulted and Agree with Plan of Care Patient      Patient will benefit from skilled therapeutic intervention in order to improve the following deficits and impairments:     Visit Diagnosis: Other symptoms and signs involving the musculoskeletal system - Plan: Ot plan of care cert/re-cert  Other lack of coordination - Plan: Ot plan of care cert/re-cert      G-Codes - 19/37/90 1546    Functional Assessment Tool Used (Outpatient only) clinical judgement   Functional Limitation Self care   Self Care Current Status [phone]) At least 1 percent but less than 20 percent impaired, limited or restricted   Self Care Goal Status (D5329) At least 1 percent but less than 20 percent impaired, limited or restricted   Self Care Discharge Status [phone]) At least 1 percent but less than 20 percent impaired, limited or restricted      Problem List Patient Active Problem List   Diagnosis Date Noted  . Dysmetria   . Diabetes mellitus type 2 in nonobese (HCC)   . Supplemental oxygen dependent   . Benign essential HTN   . Hypokalemia   . Hypoalbuminemia due to protein-calorie malnutrition ([place])   . Embolic stroke of right basal ganglia ([place]) 10/12/2016  . Oropharyngeal dysphagia   . Stroke ([place]) 10/09/2016  .  Stroke (cerebrum) ([place]) 10/09/2016  . 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 Frequent falls 04/21/2016  . 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 Type 2 diabetes mellitus with peripheral neuropathy (HCC)   . Fibromyalgia   . Chronic pain syndrome   . Chronic obstructive pulmonary disease ([place])   . Gait disturbance, post-stroke   . Dysarthria, post-stroke   . Dysphagia, post-stroke   . 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 Mixed hyperlipidemia   . 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  . Allergic rhinitis 12/17/2015  . Primary osteoarthritis of both knees 11/19/2015  . Obesity (BMI 30-39.9) 09/04/2015  . Essential hypertension 09/04/2015  . Constipation 09/04/2015  . Asthma 08/26/2015  . Raynaud's phenomenon 08/26/2015  . Gastro-esophageal reflux disease with esophagitis 08/26/2015  . Spinal stenosis 08/26/2015  . Vitamin D deficiency 08/26/2015  . Diabetes mellitus, type 2 ([place]) 08/22/2015  . HNP (herniated nucleus pulposus), cervical 07/10/2015  . Cervical radicular pain 04/30/2013  . Knee osteoarthritis 01/05/2012    Luther Hearing, OT Student [phone] 10/25/2016, 3:50 PM  [facility] [address] [place], [place], [address] Phone: [phone]   Fax:  [phone]  Name: WISDOM SEYBOLD MRN: [id number] Date of Birth: Dec 29, 1940     This qualified practitioner was present in the room guiding the student in service delivery. Therapy student was participating in the provision of services, and the practitioner was not engaged in treating another patient or doing other tasks at the same time.  Ailene Ravel, OTR/L,CBIS  [phone]

## 2016-10-25 NOTE — Patient Instructions (Signed)
Home Exercises Program Theraputty Exercises  Do the following exercises 1-2 times a day using your affected hand.  1. Roll putty into a ball.  2. Make into a pancake.  3. Roll putty into a roll.  4. Pinch along log with first finger and thumb.   5. Make into a ball.  6. Roll it back into a log.   7. Pinch using thumb and side of first finger.  8. Roll into a ball, then flatten into a pancake.  9. Using your fingers, make putty into a mountain.  10. Roll putty into a ball and squeeze 12 times.       Resisted External Rotation: in Neutral - Bilateral   Sit or stand, tubing in both hands, elbows at sides, bent to 90, forearms forward. Pinch shoulder blades together and rotate forearms out. Keep elbows at sides. Repeat __12-15__ times per set. Do _2__ sets per session. Do _2__ sessions per day.    Copyright  VHI. All rights reserved.

## 2016-10-25 NOTE — Patient Instructions (Addendum)
Bridging    Slowly raise buttocks from floor, keeping stomach tight. Repeat __10__ times per set. Do __1_ sets per session. Do _2___ sessions per day.  http://orth.exer.us/1096   Copyright  VHI. All rights reserved.  Strengthening: Straight Leg Raise (Phase 1)    Tighten muscles on front of left thigh, then lift leg _15___ inches from surface, keeping knee locked.  Repeat _10___ times per set. Do _1___ sets per session. Do 2____ sessions per day.  http://orth.exer.us/614   Copyright  VHI. All rights reserved.  Heel Raise: Bilateral (Standing)   At the kitchen counter Rise on balls of feet. Repeat 10____ times per set. Do __1__ sets per session. Do __2__ sessions per day.  http://orth.exer.us/38   Copyright  VHI. All rights reserved.  Functional Quadriceps: Chair Squat    Keeping feet flat on floor, shoulder width apart, squat as low as is comfortable. Use support as necessary. Repeat _10___ times per set. Do __1__ sets per session. Do ___2_ sessions per day.  http://orth.exer.us/736   Copyright  VHI. All rights reserved.

## 2016-10-25 NOTE — Therapy (Signed)
[place], [place], [address] Phone: [phone]   Fax:  [phone]  Physical Therapy Evaluation  Patient Details  Name: Susan Patel MRN: [id number] Date of Birth: 06-14-1940 Referring Provider: Lauraine Rinne  Encounter Date: 10/25/2016      PT End of Session - 10/25/16 1340    Visit Number 1   Number of Visits 8   Date for PT Re-Evaluation 11/24/16   Authorization Type [employer]   Authorization - Visit Number 1   Authorization - Number of Visits 8   PT Start Time 6301   PT Stop Time 1340   PT Time Calculation (min) 45 min      Past Medical History:  Diagnosis Date  . Arthritis    'all over"  . Asthma   . Chronic pain   . COPD (chronic obstructive pulmonary disease) (HCC)    O2 per [place] at nights   . Depression   . Diabetes mellitus without complication ([place])    borderline- , states she was on med., but MD told her "everything is under control so I threw the bottle away"  . Fibromyalgia   . GERD (gastroesophageal reflux disease)   . Hypertension   . Neuromuscular disorder (HCC)    parkinson, neuropathy- both feet & hands.  . 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 ABDOMINAL HYSTERECTOMY    . ANTERIOR CERVICAL DECOMP/DISCECTOMY FUSION N/A 07/10/2015   Procedure: Cervical five-six, Cervical six-seven anterior cervical decompression with fusion plating and bonegraft;  Surgeon: Jovita Gamma, MD;  Location: [place] [facility];  Service: Neurosurgery;  Laterality: N/A;  . APPENDECTOMY    . BIOPSY EYE MUSCLE  03/20/2016   biopsy vessel to right eye due to swelling  . EYE SURGERY Bilateral    cataracts removed, /w "cyrstal lenses"   . SHOULDER ARTHROSCOPY Right    x2   RCR- spurs removed     There were no vitals filed for this visit.       Subjective Assessment - 10/25/16 1254    Subjective Susan Patel states that she had a stroke the  end of May. Her in-patient rehabilitation ended on 10/19/2016.  She is not cooking or doing any of her housework at this time    Pertinent History Fibromyalgia, Lt CVA, DM, HTN , parkinson   Limitations House hold activities;Walking   How long can you sit comfortably? no problem    How long can you stand comfortably? has not tried    How long can you walk comfortably? she is walking 15-20 minutes everyday   Patient Stated Goals To wash clothes, cook and do her housework    Currently in Pain? No/denies            [facility] PT Assessment - 10/25/16 0001      Assessment   Medical Diagnosis Rt CVA; Basal Ganglia and occipital    Referring Provider Lauraine Rinne   Onset Date/Surgical Date 10/06/16   Next MD Visit 11/28/2016   Prior Therapy acute and IP     Precautions   Precautions None     Restrictions   Weight Bearing Restrictions No     Balance Screen   Has the patient fallen in the past 6 months No   Has the patient had a decrease in activity level because of a fear of falling?  Yes   Is the patient reluctant to  leave their home because of a fear of falling?  No     Home Ecologist residence     Prior Function   Level of Independence Independent     Cognition   Overall Cognitive Status Within Functional Limits for tasks assessed     Observation/Other Assessments   Focus on Therapeutic Outcomes (FOTO)  59     Single Leg Stance   Comments Rt 6 seconds, Lt 3 seconds      Sit to Stand   Comments 5x 16.03      Strength   Right Hip Flexion 5/5   Right Hip Extension 2-/5   Right Hip ABduction 3+/5   Left Hip Flexion 4-/5   Left Hip Extension 2-/5   Left Hip ABduction 4/5   Right Knee Flexion 3+/5   Right Knee Extension 5/5   Left Knee Flexion 3+/5   Left Knee Extension 5/5   Right Ankle Dorsiflexion 5/5   Left Ankle Dorsiflexion 5/5     Standardized Balance Assessment   Standardized Balance Assessment Berg Balance Test     Berg  Balance Test   Sit to Stand Able to stand without using hands and stabilize independently   Standing Unsupported Able to stand safely 2 minutes   Sitting with Back Unsupported but Feet Supported on Floor or Stool Able to sit safely and securely 2 minutes   Stand to Sit Sits safely with minimal use of hands   Transfers Able to transfer safely, definite need of hands   Standing Unsupported with Eyes Closed Able to stand 10 seconds with supervision   Standing Ubsupported with Feet Together Able to place feet together independently and stand for 1 minute with supervision   From Standing, Reach Forward with Outstretched Arm Can reach confidently >25 cm (10")   From Standing Position, Pick up Object from Floor Able to pick up shoe safely and easily   From Standing Position, Turn to Look Behind Over each Shoulder Looks behind one side only/other side shows less weight shift   Turn 360 Degrees Able to turn 360 degrees safely but slowly   Standing Unsupported, Alternately Place Feet on Step/Stool Able to stand independently and safely and complete 8 steps in 20 seconds   Standing Unsupported, One Foot in Front Able to take small step independently and hold 30 seconds   Standing on One Leg Able to lift leg independently and hold equal to or more than 3 seconds   Total Score 46            Objective measurements completed on examination: See above findings.          [place] Adult PT Treatment/Exercise - 10/25/16 0001      Bed Mobility   Bed Mobility Rolling Right;Rolling Left;Supine to Sit;Sit to Supine   Rolling Right 5: Supervision  max verbal cuing for technique   Rolling Left 5: Supervision  max verbal cuing for technique   Supine to Sit 5: Supervision   Sit to Supine 5: Supervision     Berg Balance Test   Sit to Stand Able to stand without using hands and stabilize independently   Standing Unsupported Able to stand safely 2 minutes   Sitting with Back Unsupported but Feet  Supported on Floor or Stool Able to sit safely and securely 2 minutes   Stand to Sit Sits safely with minimal use of hands   Transfers Able to transfer safely, minor use of hands  Standing Unsupported with Eyes Closed Able to stand 10 seconds safely   Standing Ubsupported with Feet Together Able to place feet together independently and stand for 1 minute with supervision   From Standing, Reach Forward with Outstretched Arm Can reach forward >12 cm safely (5")   From Standing Position, Pick up Object from [place] to pick up shoe, needs supervision   From Standing Position, Turn to Look Behind Over each Shoulder Looks behind from both sides and weight shifts well   Turn 360 Degrees Able to turn 360 degrees safely in 4 seconds or less   Standing Unsupported, Alternately Place Feet on Step/Stool Able to complete >2 steps/needs minimal assist   Standing Unsupported, One Foot in [place] to take small step independently and hold 30 seconds   Standing on One Leg Able to lift leg independently and hold equal to or more than 3 seconds   Total Score 46     Exercises   Exercises Knee/Hip     Knee/Hip Exercises: Standing   SLS x3      Knee/Hip Exercises: Supine   Bridges 10 reps   Straight Leg Raises Left;5 reps     Knee/Hip Exercises: Sidelying   Hip ABduction Both;5 reps     Knee/Hip Exercises: Prone   Hamstring Curl 10 reps                PT Education - 10/25/16 1339    Education provided Yes   Education Details HEP   Person(s) Educated Patient   Methods Explanation   Comprehension Verbalized understanding          PT Short Term Goals - 10/25/16 1346      PT SHORT TERM GOAL #1   Title Pt to be able to single leg stance for at least 10 seconds on both legs to reduce risk of falling    Time 2   Period Weeks   Status New     PT SHORT TERM GOAL #2   Title Pt to state that she is completing light housework activites    Time 2   Period Weeks   Status New     PT  SHORT TERM GOAL #3   Title Pt to be I in all bed mobility aspects.    Time 2   Period Weeks   Status New           PT Long Term Goals - 10/25/16 1655      PT LONG TERM GOAL #1   Title Pt  leg strength to be increased one grade to allow pt to go up and down 5 steps with no difficulty   Time 4   Period Weeks   Status New     PT LONG TERM GOAL #2   Title Berg balance test to be at least 54 to allow pt to feel confident walking on uneven terrain.    Time 4   Period Weeks   Status New     PT LONG TERM GOAL #3   Title Pt to be able to walk for over 40 minutes to be able to complete the grocery shopping    Time 4   Period Weeks   Status New     PT LONG TERM GOAL #4   Title Pt to be confident vacuuming .    Time 4   Period Weeks   Status New  Plan - 10-27-2016 1643    Clinical Impression Statement Susan Patel is a 76 yo female who has had two strokes, Rt basal ganglia and Rt occipital.  She was discharged for in-patient rehab on 10/19/2016 and is now being referred for skilled out patient therapy.  Evaluation demonstrates decreased balance, decreased stength, decreased bed mobility, decreaed functinal mobilty and decreased activty tolerance.  Susan Patel will benefit from skilled physical therapy to address these issues and maximize her functional ability.    History and Personal Factors relevant to plan of care: Parkinson, previous Lt CVA, fibromyalgia, back pain, DM, HTN   Clinical Presentation Stable   Clinical Decision Making Moderate   Rehab Potential Good   PT Frequency 2x / week   PT Duration 4 weeks   PT Treatment/Interventions ADLs/Self Care Home Management;Patient/family education;Therapeutic exercise;Balance training;Neuromuscular re-education;Therapeutic activities;Gait training;Stair training   PT Next Visit Plan Begin educating on bed mobility, sit to sidelying, rolling, SLS, tandem stance, tandem gt, retro gait.  Progress to high level balance  activites.    PT Home Exercise Plan heel raises, squats, bridges,    Consulted and Agree with Plan of Care Patient      Patient will benefit from skilled therapeutic intervention in order to improve the following deficits and impairments:  Decreased activity tolerance, Decreased balance, Decreased mobility, Difficulty walking, Decreased strength, Pain, Improper body mechanics, Postural dysfunction  Visit Diagnosis: Unsteadiness on feet  Muscle weakness (generalized)  Other lack of coordination  Other symptoms and signs involving cognitive functions following cerebral infarction      G-Codes - Oct 27, 2016 1652    Functional Assessment Tool Used (Outpatient Only) foto   Functional Limitation Mobility: Walking and moving around   Mobility: Walking and Moving Around Current Status [phone]) At least 40 percent but less than 60 percent impaired, limited or restricted   Mobility: Walking and Moving Around Goal Status [phone]) At least 20 percent but less than 40 percent impaired, limited or restricted       Problem List Patient Active Problem List   Diagnosis Date Noted  . Dysmetria   . Diabetes mellitus type 2 in nonobese (HCC)   . Supplemental oxygen dependent   . Benign essential HTN   . Hypokalemia   . Hypoalbuminemia due to protein-calorie malnutrition ([place])   . Embolic stroke of right basal ganglia ([place]) 10/12/2016  . Oropharyngeal dysphagia   . Stroke ([place]) 10/09/2016  . Stroke (cerebrum) ([place]) 10/09/2016  . 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 Frequent falls 04/21/2016  . 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 Type 2 diabetes mellitus with peripheral neuropathy (HCC)   . Fibromyalgia   . Chronic pain syndrome   . Chronic obstructive pulmonary disease ([place])   . Gait disturbance, post-stroke   . Dysarthria, post-stroke   . Dysphagia, post-stroke   . 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 Mixed hyperlipidemia   . 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  . Allergic rhinitis 12/17/2015  . Primary osteoarthritis of both knees 11/19/2015  . Obesity (BMI 30-39.9) 09/04/2015  . Essential hypertension 09/04/2015  . Constipation 09/04/2015  . Asthma 08/26/2015  . Raynaud's phenomenon 08/26/2015  . Gastro-esophageal reflux disease with esophagitis 08/26/2015  . Spinal stenosis 08/26/2015  . Vitamin D deficiency 08/26/2015  . Diabetes mellitus, type 2 ([facility] [place]) 08/22/2015  . HNP (herniated nucleus pulposus), cervical 07/10/2015  . Cervical radicular pain 04/30/2013  .  Knee osteoarthritis 01/05/2012   Rayetta Humphrey, PT CLT [phone] 10/25/2016, 4:55 PM  [facility] [address] [place], [place], [address] Phone: [phone]   Fax:  [phone]  Name: SRIYA KROEZE MRN: [id number] Date of Birth: 1941-02-05

## 2016-10-26 ENCOUNTER — Ambulatory Visit (HOSPITAL_COMMUNITY): Payer: Medicare Other | Admitting: Speech Pathology

## 2016-10-26 ENCOUNTER — Encounter (HOSPITAL_COMMUNITY): Payer: Self-pay | Admitting: Speech Pathology

## 2016-10-26 DIAGNOSIS — I69822 Dysarthria following other cerebrovascular disease: Secondary | ICD-10-CM

## 2016-10-26 DIAGNOSIS — R1312 Dysphagia, oropharyngeal phase: Secondary | ICD-10-CM

## 2016-10-26 DIAGNOSIS — R2681 Unsteadiness on feet: Secondary | ICD-10-CM | POA: Diagnosis not present

## 2016-10-26 DIAGNOSIS — R41841 Cognitive communication deficit: Secondary | ICD-10-CM

## 2016-10-26 NOTE — Telephone Encounter (Signed)
Called and made apt time change from 5:30 to 2:30 on 10/27/16, pt and husband agreeded to change time  Susan Patel, Susan Patel; [place]

## 2016-10-26 NOTE — Therapy (Signed)
[place], [place], [address] Phone: [phone]   Fax:  [phone]  Speech Language Pathology Evaluation  Patient Details  Name: Susan Patel MRN: [id number] Date of Birth: 1940/10/01 Referring Provider: Lauraine Rinne  Encounter Date: 10/26/2016      End of Session - 10/26/16 1806    Visit Number 1   Number of Visits 16   Date for SLP Re-Evaluation 12/23/16   Authorization Type [employer]   SLP Start Time 1435   SLP Stop Time  1515   SLP Time Calculation (min) 40 min   Activity Tolerance Patient tolerated treatment well      Past Medical History:  Diagnosis Date  . Arthritis    'all over"  . Asthma   . Chronic pain   . COPD (chronic obstructive pulmonary disease) (HCC)    O2 per [place] at nights   . Depression   . Diabetes mellitus without complication ([place])    borderline- , states she was on med., but MD told her "everything is under control so I threw the bottle away"  . Fibromyalgia   . GERD (gastroesophageal reflux disease)   . Hypertension   . Neuromuscular disorder (HCC)    parkinson, neuropathy- both feet & hands.  . Stroke [facility])    residual dysarthria    Past Surgical History:  Procedure Laterality Date  . ABDOMINAL HYSTERECTOMY    . ANTERIOR CERVICAL DECOMP/DISCECTOMY FUSION N/A 07/10/2015   Procedure: Cervical five-six, Cervical six-seven anterior cervical decompression with fusion plating and bonegraft;  Surgeon: Jovita Gamma, MD;  Location: [place] [facility];  Service: Neurosurgery;  Laterality: N/A;  . APPENDECTOMY    . BIOPSY EYE MUSCLE  03/20/2016   biopsy vessel to right eye due to swelling  . EYE SURGERY Bilateral    cataracts removed, /w "cyrstal lenses"   . SHOULDER ARTHROSCOPY Right    x2   RCR- spurs removed     There were no vitals filed for this visit.      Subjective Assessment - 10/26/16 1754    Subjective "I have trouble with my speech and swallowing."    Patient is accompained by: Family member  Spouse   Currently in Pain? No/denies            SLP Evaluation [facility] - 10/26/16 1754      SLP Visit Information   SLP Received On 10/26/16   Referring Provider Lauraine Rinne   Onset Date 10/09/2016   Medical Diagnosis s/p basal ganglia CVA     Subjective   Patient/Family Stated Goal Improve speech and swallow     General Information   HPI Susan Pippenger Knightis a 76 y.o.femaleCOPD- oxygen dependent, T2DM, Neuropathy, chronic pain, CVA with residual visual deficits?, Parkinsonism; who was admitted on 10/09/16 with inability to speak, left sided weakness and difficulty walking. CT head done revealing suspicion of acute right basal ganglia infarct, subacute right occipital infarct, left basal infarct question age and extensive small vessel disease. She receive tPA and continues on ASA for secondary stroke prevention. MRI brain done revealing acute infarcts right basal ganglia anteriorright parietal white matter with chronic right occipital cortical infarct. CTA head/neck showed severe progressive advanced intracranial atherosclerosis. 2 D echo revealed EF 60-65%, with grade one diastolic dysfunction. Most recent MBSS was completed 10/19/2016 and she was started on dysphagia 1, honey liquids. ASA changed to plavix due to progressive intracranial atherosclerosis and stroke felt to be thrombotic due to small  vessel disease. She is referred for outpatient SLP to address cognitive communication deficits and dysphagia.    Behavioral/Cognition Alert and cooperative   Mobility Status Ambulatory     Prior Functional Status   Cognitive/Linguistic Baseline Baseline deficits   Baseline deficit details mild deficits from previous stroke   Type of Home House    Lives With Spouse   Available Support Family   Education BA in education; retired Pharmacist, hospital   Vocation Retired     Pain Assessment   Pain Assessment No/denies pain     Cognition   Overall Cognitive  Status Impaired/Different from baseline   Area of Impairment Safety/judgement;Attention   Current Attention Level Selective   Safety/Judgement Decreased awareness of safety   Safety and Judgement Comments Reports not adhering to dysphagia diet recommendations at home   Attention Selective   Selective Attention Impaired   Selective Attention Impairment Verbal complex   Memory Appears intact   Executive Function Reasoning   Reasoning Impaired     Auditory Comprehension   Overall Auditory Comprehension Appears within functional limits for tasks assessed   Yes/No Questions Within Functional Limits   Commands Within Functional Limits   Conversation Complex   Interfering Components Attention     Visual Recognition/Discrimination   Discrimination Not tested     Reading Comprehension   Reading Status Not tested     Expression   Primary Mode of Expression Verbal     Verbal Expression   Overall Verbal Expression Impaired   Initiation No impairment   Automatic Speech Name;Social Response   Level of Generative/Spontaneous Verbalization Conversation   Repetition Impaired   Level of Impairment Sentence level   Naming No impairment   Responsive 76-100% accurate   Confrontation 75-100% accurate   Convergent 75-100% accurate   Divergent 75-100% accurate   Pragmatics No impairment   Interfering Components Attention;Speech intelligibility   Non-Verbal Means of Communication Not applicable     Written Expression   Dominant Hand Right   Written Expression Not tested     Oral Motor/Sensory Function   Overall Oral Motor/Sensory Function Impaired   Lingual Strength Reduced   Lingual Coordination Reduced   Velum Within Functional Limits   Mandible Within Functional Limits     Motor Speech   Overall Motor Speech Impaired   Respiration Impaired   Level of Impairment Phrase   Phonation Low vocal intensity;Hoarse   Resonance Within functional limits   Articulation Impaired   Level of  Impairment Word   Intelligibility Intelligibility reduced   Word 75-100% accurate   Phrase 50-74% accurate   Sentence 50-74% accurate   Conversation 25-49% accurate   Motor Planning Witnin functional limits   Motor Speech Errors Aware;Inconsistent   Effective Techniques Slow rate;Increased vocal intensity;Over-articulate   Phonation [facility]           SLP Short Term Goals - 10/26/16 1809      SLP SHORT TERM GOAL #1   Title Pt. will participate in clinical swallow evaluation next session and likely MBSS in 1-2 weeks   Baseline Last MBS on 10/19/2016 with rec for puree and HTL   Time 2   Period Weeks   Status New goal     SLP SHORT TERM GOAL #2   Title Pt will accurately produce requested sounds in 2-3 syllable words (alveolars) with 80% acc with reps of 5 each with mod visual cue from SLP.   Baseline 50% accuracy    Time 8   Period Weeks  Status      SLP SHORT TERM GOAL #3   Title Pt. will complete oral motor exercises, pharyngeal strengthening, and lingual ROM exercises with accuracy in 8/10 trials given minimal feedback.    Baseline 50% accuracy    Time 8   Period Weeks   Status      SLP SHORT TERM GOAL #4   Title Pt. will verbalize response to hypothetical problem solving situations with 90% acc given current safety precautions with min assistance.    Baseline 70% accuracy    Time 8   Period Weeks   Status              SLP Long Term Goals - 2016/11/17 1809      SLP LONG TERM GOAL #1   Title Pt. will increase speech intelligibility to 85% during conversational exchanges with no assistance.    Baseline 60% accuracy    Time 8   Period Weeks   Status      SLP LONG TERM GOAL #2   Title Pt will demonstrate adequate safety awareness to be home alone for short periods with use of strategies.   Baseline 24 hour supervision   Time 8   Period Weeks   Status      SLP LONG TERM GOAL #3   Title Pt. will safely swallow least restrictive diet with no s/s of aspiration in  9/10 trials and no cues.    Baseline On puree and HTL   Time 8   Period Weeks   Status           Plan - November 17, 2016 1807    Clinical Impression Statement Pt presents with moderate dysarthria with significantly reduced speech intelligibility at the phrase level and mild cognitive impairment with deficits in attention, impulsivity, safety awareness, and planning as evidenced by disregard for aspiration precautions at home per husband (pt released on puree and HTL diet and has been consuming solids and thin liquids at times at home). SLP to complete clinical swallow evaluation next session and likely repeat MBSS in 1-2 weeks. Pt will benefit from skilled SLP in order to address the above impairments, maximize independence, and decrease burden of care    Speech Therapy Frequency 2x / week   Duration --  8 weeks   Treatment/Interventions Aspiration precaution training;Diet toleration management by SLP;SLP instruction and feedback;Compensatory strategies;Patient/family education;Oral motor exercises;Trials of upgraded texture/liquids   Potential to Achieve Goals Good   Potential Considerations Previous level of function   SLP Home Exercise Plan Pt will complete HEP as assigned to facilitate carryover of treatment strategies and techniques in home environment with assist from spouse.   Consulted and Agree with Plan of Care Patient   Family Member Consulted patient's husband       Patient will benefit from skilled therapeutic intervention in order to improve the following deficits and impairments:   Dysarthria following other cerebrovascular disease  Cognitive communication deficit  Dysphagia, oropharyngeal phase      G-Codes - Nov 17, 2016 1809    Functional Assessment Tool Used skilled observation    Functional Limitations Motor speech   Motor Speech Current Status [phone]) At least 40 percent but less than 60 percent impaired, limited or restricted   Motor Speech Goal Status (O3500) At least  20 percent but less than 40 percent impaired, limited or restricted      Problem List Patient Active Problem List   Diagnosis Date Noted  . Dysmetria   . Diabetes mellitus type 2  in nonobese ([place])   . Supplemental oxygen dependent   . Benign essential HTN   . Hypokalemia   . Hypoalbuminemia due to protein-calorie malnutrition ([place])   . Embolic stroke of right basal ganglia ([place]) 10/12/2016  . Oropharyngeal dysphagia   . Stroke ([place]) 10/09/2016  . Stroke (cerebrum) ([place]) 10/09/2016  . 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 Frequent falls 04/21/2016  . 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 Type 2 diabetes mellitus with peripheral neuropathy (HCC)   . Fibromyalgia   . Chronic pain syndrome   . Chronic obstructive pulmonary disease ([place])   . Gait disturbance, post-stroke   . Dysarthria, post-stroke   . Dysphagia, post-stroke   . 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 Mixed hyperlipidemia   . 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  . Allergic rhinitis 12/17/2015  . Primary osteoarthritis of both knees 11/19/2015  . Obesity (BMI 30-39.9) 09/04/2015  . Essential hypertension 09/04/2015  . Constipation 09/04/2015  . Asthma 08/26/2015  . Raynaud's phenomenon 08/26/2015  . Gastro-esophageal reflux disease with esophagitis 08/26/2015  . Spinal stenosis 08/26/2015  . Vitamin D deficiency 08/26/2015  . Diabetes mellitus, type 2 ([place]) 08/22/2015  . HNP (herniated nucleus pulposus), cervical 07/10/2015  . Cervical radicular pain 04/30/2013  . Knee osteoarthritis 01/05/2012   Thank you,  Genene Churn, [place]  [facility] 10/26/2016, 6:10 PM  [place] [address] [place], [place], [address] Phone: [phone]   Fax:  [phone]  Name: Susan Patel MRN: [id number] Date of Birth: 1941-01-12

## 2016-10-27 ENCOUNTER — Ambulatory Visit (HOSPITAL_COMMUNITY): Payer: Medicare Other | Admitting: Speech Pathology

## 2016-10-27 ENCOUNTER — Encounter (HOSPITAL_COMMUNITY): Payer: Self-pay | Admitting: Speech Pathology

## 2016-10-27 ENCOUNTER — Ambulatory Visit (HOSPITAL_COMMUNITY): Payer: Medicare Other | Admitting: Physical Therapy

## 2016-10-27 DIAGNOSIS — R41841 Cognitive communication deficit: Secondary | ICD-10-CM

## 2016-10-27 DIAGNOSIS — R1312 Dysphagia, oropharyngeal phase: Secondary | ICD-10-CM

## 2016-10-27 DIAGNOSIS — R2681 Unsteadiness on feet: Secondary | ICD-10-CM | POA: Diagnosis not present

## 2016-10-27 DIAGNOSIS — M6281 Muscle weakness (generalized): Secondary | ICD-10-CM

## 2016-10-27 DIAGNOSIS — I69822 Dysarthria following other cerebrovascular disease: Secondary | ICD-10-CM

## 2016-10-27 DIAGNOSIS — R278 Other lack of coordination: Secondary | ICD-10-CM

## 2016-10-27 NOTE — Therapy (Signed)
[place], [place], [address] Phone: [phone]   Fax:  [phone]  Physical Therapy Treatment  Patient Details  Name: Susan Patel MRN: [id number] Date of Birth: September 07, 1940 Referring Provider: Lauraine Rinne  Encounter Date: 10/27/2016      PT End of Session - 10/27/16 1527    Visit Number 2   Number of Visits 8   Date for PT Re-Evaluation 11/24/16   Authorization Type [employer]   Authorization - Visit Number 2   Authorization - Number of Visits 8   PT Start Time 4081   PT Stop Time 1511   PT Time Calculation (min) 40 min   Activity Tolerance Patient tolerated treatment well   Behavior During Therapy [facility] for tasks assessed/performed      Past Medical History:  Diagnosis Date  . Arthritis    'all over"  . Asthma   . Chronic pain   . COPD (chronic obstructive pulmonary disease) (HCC)    O2 per [place] at nights   . Depression   . Diabetes mellitus without complication ([place])    borderline- , states she was on med., but MD told her "everything is under control so I threw the bottle away"  . Fibromyalgia   . GERD (gastroesophageal reflux disease)   . Hypertension   . Neuromuscular disorder (HCC)    parkinson, neuropathy- both feet & hands.  . Stroke [facility])    residual dysarthria    Past Surgical History:  Procedure Laterality Date  . ABDOMINAL HYSTERECTOMY    . ANTERIOR CERVICAL DECOMP/DISCECTOMY FUSION N/A 07/10/2015   Procedure: Cervical five-six, Cervical six-seven anterior cervical decompression with fusion plating and bonegraft;  Surgeon: Jovita Gamma, MD;  Location: [place] NEURO ORS;  Service: Neurosurgery;  Laterality: N/A;  . APPENDECTOMY    . BIOPSY EYE MUSCLE  03/20/2016   biopsy vessel to right eye due to swelling  . EYE SURGERY Bilateral    cataracts removed, /w "cyrstal lenses"   . SHOULDER ARTHROSCOPY Right    x2   RCR- spurs removed     There were no vitals filed for this  visit.      Subjective Assessment - 10/27/16 1434    Subjective Patient arrives stating she is doing well but she has not been doing her HEP every day because she has been busy. No falls or close calls. She does have Parkinsons but the stroke is her bigger concern right now.    Pertinent History Fibromyalgia, Lt CVA, DM, HTN , parkinson   Patient Stated Goals To wash clothes, cook and do her housework    Currently in Pain? No/denies                         [facility] Adult PT Treatment/Exercise - 10/27/16 0001      Knee/Hip Exercises: Standing   Heel Raises Both;1 set;10 reps   Heel Raises Limitations heel and toe, no UEs    Lateral Step Up Both;1 set;10 reps   Lateral Step Up Limitations R before L for neuro re-ed    Forward Step Up Both;1 set;10 reps   Forward Step Up Limitations R before L for neuro re-ed      Knee/Hip Exercises: Seated   Other Seated Knee/Hip Exercises R LE crossed over L for L LE activation: L lateral reaches for core and L LE activation    Sit to Sand 1 set;10 reps;without UE support;Other (comment)  UEs to side of L LE      Knee/Hip Exercises: Supine   Bridges 10 reps     Knee/Hip Exercises: Sidelying   Hip ABduction Both;10 reps   Hip ABduction Limitations tactile cues for form              Balance Exercises - 10/27/16 1526      Balance Exercises: Standing   Step Over Hurdles / Cones small then large hurdles forwards and lateral x3 cues for form            PT Education - 10/27/16 1527    Education provided Yes   Education Details review of initial eval/goals, general POC moving forward    Person(s) Educated Patient;Spouse   Methods Explanation;Handout   Comprehension Verbalized understanding          PT Short Term Goals - 10/25/16 1346      PT SHORT TERM GOAL #1   Title Pt to be able to single leg stance for at least 10 seconds on both legs to reduce risk of falling    Time 2   Period Weeks   Status New     PT  SHORT TERM GOAL #2   Title Pt to state that she is completing light housework activites    Time 2   Period Weeks   Status New     PT SHORT TERM GOAL #3   Title Pt to be I in all bed mobility aspects.    Time 2   Period Weeks   Status New           PT Long Term Goals - 10/25/16 1655      PT LONG TERM GOAL #1   Title Pt  leg strength to be increased one grade to allow pt to go up and down 5 steps with no difficulty   Time 4   Period Weeks   Status New     PT LONG TERM GOAL #2   Title Berg balance test to be at least 54 to allow pt to feel confident walking on uneven terrain.    Time 4   Period Weeks   Status New     PT LONG TERM GOAL #3   Title Pt to be able to walk for over 40 minutes to be able to complete the grocery shopping    Time 4   Period Weeks   Status New     PT LONG TERM GOAL #4   Title Pt to be confident vacuuming .    Time 4   Period Weeks   Status New               Plan - 10/27/16 1529    Clinical Impression Statement Patient arrives today with her spouse, both very pleasant and motivated to participate in skilled PT services. Focused on functional strengthening including neuro re-ed principles today, with cues for form and performing activities without UEs as able to also address balance. Patient S for all bed mobility activities today. Also worked on hurdles forwards and lateral in bars today, noting difficulty with control and form especially lateral. Patient and spouse confirm that she has Parkinsons diagnosis however report it is early on and they are more concerned about recovery from CVA; major impairments appear clinically more related to CVA however we will continue to monitor for worsening of Parkinsonian symptoms and request referral if necessary moving forward.    Rehab Potential Good   PT Frequency 2x /  week   PT Duration 4 weeks   PT Treatment/Interventions ADLs/Self Care Home Management;Patient/family education;Therapeutic  exercise;Balance training;Neuromuscular re-education;Therapeutic activities;Gait training;Stair training   PT Next Visit Plan work on bed mobility, functional strength with L before R; SLS, tandem stance and gait, retro and lateral gait. Seated core work with R LE crossed over L.    Consulted and Agree with Plan of Care Patient      Patient will benefit from skilled therapeutic intervention in order to improve the following deficits and impairments:  Decreased activity tolerance, Decreased balance, Decreased mobility, Difficulty walking, Decreased strength, Pain, Improper body mechanics, Postural dysfunction  Visit Diagnosis: Unsteadiness on feet  Muscle weakness (generalized)  Other lack of coordination     Problem List Patient Active Problem List   Diagnosis Date Noted  . Dysmetria   . Diabetes mellitus type 2 in nonobese (HCC)   . Supplemental oxygen dependent   . Benign essential HTN   . Hypokalemia   . Hypoalbuminemia due to protein-calorie malnutrition ([place])   . Embolic stroke of right basal ganglia ([place]) 10/12/2016  . Oropharyngeal dysphagia   . Stroke ([place]) 10/09/2016  . Stroke (cerebrum) ([place]) 10/09/2016  . 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 Frequent falls 04/21/2016  . 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 Type 2 diabetes mellitus with peripheral neuropathy (HCC)   . Fibromyalgia   . Chronic pain syndrome   . Chronic obstructive pulmonary disease ([place])   . Gait disturbance, post-stroke   . Dysarthria, post-stroke   . Dysphagia, post-stroke   . Cerebrovascular accident (CVA) due to occlusion of basilar artery ([place])   . Mixed hyperlipidemia   . 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  . Allergic rhinitis 12/17/2015  . Primary osteoarthritis of both knees 11/19/2015  . Obesity (BMI 30-39.9) 09/04/2015  . Essential hypertension 09/04/2015  . Constipation 09/04/2015  . Asthma 08/26/2015  .  Raynaud's phenomenon 08/26/2015  . Gastro-esophageal reflux disease with esophagitis 08/26/2015  . Spinal stenosis 08/26/2015  . Vitamin D deficiency 08/26/2015  . Diabetes mellitus, type 2 ([place]) 08/22/2015  . HNP (herniated nucleus pulposus), cervical 07/10/2015  . Cervical radicular pain 04/30/2013  . Knee osteoarthritis 01/05/2012    Deniece Ree PT, DPT [place] [address] [place], [place], [address] Phone: [phone]   Fax:  [phone]  Name: Susan Patel MRN: [id number] Date of Birth: May 05, 1941

## 2016-10-27 NOTE — Therapy (Signed)
[place], [place], [address] Phone: [phone]   Fax:  [phone]  Speech Language Pathology -Clinical Swallow Evaluation  Patient Details  Name: Susan Patel MRN: [id number] Date of Birth: 08-14-1940 Referring Provider: Lauraine Rinne  Encounter Date: 10/27/2016      End of Session - 10/27/16 1901    Visit Number 2   Number of Visits 16   Date for SLP Re-Evaluation 12/23/16   Authorization Type [employer] Medicare   SLP Start Time 1515   SLP Stop Time  1600   SLP Time Calculation (min) 45 min   Activity Tolerance Patient tolerated treatment well      Past Medical History:  Diagnosis Date  . Arthritis    'all over"  . Asthma   . Chronic pain   . COPD (chronic obstructive pulmonary disease) (HCC)    O2 per [place] at nights   . Depression   . Diabetes mellitus without complication ([place])    borderline- , states she was on med., but MD told her "everything is under control so I threw the bottle away"  . Fibromyalgia   . GERD (gastroesophageal reflux disease)   . Hypertension   . Neuromuscular disorder (HCC)    parkinson, neuropathy- both feet & hands.  . Stroke [facility])    residual dysarthria    Past Surgical History:  Procedure Laterality Date  . ABDOMINAL HYSTERECTOMY    . ANTERIOR CERVICAL DECOMP/DISCECTOMY FUSION N/A 07/10/2015   Procedure: Cervical five-six, Cervical six-seven anterior cervical decompression with fusion plating and bonegraft;  Surgeon: Jovita Gamma, MD;  Location: [place] [facility];  Service: Neurosurgery;  Laterality: N/A;  . APPENDECTOMY    . BIOPSY EYE MUSCLE  03/20/2016   biopsy vessel to right eye due to swelling  . EYE SURGERY Bilateral    cataracts removed, /w "cyrstal lenses"   . SHOULDER ARTHROSCOPY Right    x2   RCR- spurs removed     There were no vitals filed for this visit.      Subjective Assessment - 10/27/16 1856    Subjective "I have trouble with my speech and  swallowing."   Patient is accompained by: Family member  Spouse   Currently in Pain? No/denies            SLP Evaluation [facility] - 10/26/16 1754      SLP Visit Information   SLP Received On 10/26/16   Referring Provider Lauraine Rinne   Onset Date 10/09/2016   Medical Diagnosis s/p basal ganglia CVA     Subjective   Patient/Family Stated Goal Improve speech and swallow     General Information   HPI Fredrick Dray Knightis a 76 y.o.femaleCOPD- oxygen dependent, T2DM, Neuropathy, chronic pain, CVA with residual visual deficits?, Parkinsonism; who was admitted on 10/09/16 with inability to speak, left sided weakness and difficulty walking. CT head done revealing suspicion of acute right basal ganglia infarct, subacute right occipital infarct, left basal infarct question age and extensive small vessel disease. She receive tPA and continues on ASA for secondary stroke prevention. MRI brain done revealing acute infarcts right basal ganglia anteriorright parietal white matter with chronic right occipital cortical infarct. CTA head/neck showed severe progressive advanced intracranial atherosclerosis. 2 D echo revealed EF 60-65%, with grade one diastolic dysfunction. Most recent MBSS was completed 10/19/2016 and she was started on dysphagia 1, honey liquids. ASA changed to plavix due to progressive intracranial atherosclerosis and stroke felt to be thrombotic due  to small vessel disease. She is referred for outpatient SLP to address cognitive communication deficits and dysphagia.    Behavioral/Cognition Alert and cooperative   Mobility Status Ambulatory     Prior Functional Status   Cognitive/Linguistic Baseline Baseline deficits   Baseline deficit details mild deficits from previous stroke   Type of Home House    Lives With Spouse   Available Support Family   Education BA in education; retired Pharmacist, hospital   Vocation Retired     Pain Assessment   Pain Assessment No/denies pain     Cognition    Overall Cognitive Status Impaired/Different from baseline   Area of Impairment Safety/judgement;Attention   Current Attention Level Selective   Safety/Judgement Decreased awareness of safety   Safety and Judgement Comments Reports not adhering to dysphagia diet recommendations at home   Attention Selective   Selective Attention Impaired   Selective Attention Impairment Verbal complex   Memory Appears intact   Executive Function Reasoning   Reasoning Impaired     Auditory Comprehension   Overall Auditory Comprehension Appears within functional limits for tasks assessed   Yes/No Questions Within Functional Limits   Commands Within Functional Limits   Conversation Complex   Interfering Components Attention     Visual Recognition/Discrimination   Discrimination Not tested     Reading Comprehension   Reading Status Not tested     Expression   Primary Mode of Expression Verbal     Verbal Expression   Overall Verbal Expression Impaired   Initiation No impairment   Automatic Speech Name;Social Response   Level of Generative/Spontaneous Verbalization Conversation   Repetition Impaired   Level of Impairment Sentence level   Naming No impairment   Responsive 76-100% accurate   Confrontation 75-100% accurate   Convergent 75-100% accurate   Divergent 75-100% accurate   Pragmatics No impairment   Interfering Components Attention;Speech intelligibility   Non-Verbal Means of Communication Not applicable     Written Expression   Dominant Hand Right   Written Expression Not tested     Oral Motor/Sensory Function   Overall Oral Motor/Sensory Function Impaired   Lingual Strength Reduced   Lingual Coordination Reduced   Velum Within Functional Limits   Mandible Within Functional Limits     Motor Speech   Overall Motor Speech Impaired   Respiration Impaired   Level of Impairment Phrase   Phonation Low vocal intensity;Hoarse   Resonance Within functional limits   Articulation  Impaired   Level of Impairment Word   Intelligibility Intelligibility reduced   Word 75-100% accurate   Phrase 50-74% accurate   Sentence 50-74% accurate   Conversation 25-49% accurate   Motor Planning Witnin functional limits   Motor Speech Errors Aware;Inconsistent   Effective Techniques Slow rate;Increased vocal intensity;Over-articulate   Phonation WFL           Prior Functional Status - 10/27/16 1857      Prior Functional Status   Cognitive/Linguistic Baseline Baseline deficits   Baseline deficit details mild deficits from previous stroke   Type of Home House    Lives With Spouse   Education BA in education; retired Pharmacist, hospital   Vocation Retired         General - 10/27/16 [place]   Date of Onset 10/09/16   HPI Susan Tuel Knightis a 76 y.o.femaleCOPD- oxygen dependent, T2DM, Neuropathy, chronic pain, CVA with residual visual deficits?, Parkinsonism; who was admitted on 10/09/16 with inability to speak, left sided  weakness and difficulty walking. CT head done revealing suspicion of acute right basal ganglia infarct, subacute right occipital infarct, left basal infarct question age and extensive small vessel disease. She receive tPA and continues on ASA for secondary stroke prevention. MRI brain done revealing acute infarcts right basal ganglia anteriorright parietal white matter with chronic right occipital cortical infarct. CTA head/neck showed severe progressive advanced intracranial atherosclerosis. 2 D echo revealed EF 60-65%, with grade one diastolic dysfunction. Most recent MBSS was completed 10/19/2016 and she was started on dysphagia 1, honey liquids. ASA changed to plavix due to progressive intracranial atherosclerosis and stroke felt to be thrombotic due to small vessel disease. She is referred for outpatient SLP to address cognitive communication deficits and dysphagia.    Type of Study Bedside Swallow Evaluation   Previous Swallow Assessment MBSS  10/19/2016 with recommendation for puree/HTL   Diet Prior to this Study Dysphagia 1 (puree);Honey-thick liquids   Temperature Spikes Noted No   Respiratory Status Room air   History of Recent Intubation No   Behavior/Cognition Alert;Cooperative;Pleasant mood   Oral Cavity Assessment Within Functional Limits   Oral Care Completed by SLP Yes   Oral Cavity - Dentition Adequate natural dentition   Vision Functional for self-feeding   Self-Feeding Abilities Able to feed self   Patient Positioning Upright in chair   Baseline Vocal Quality Normal;Hoarse;Low vocal intensity   Volitional Cough Strong   Volitional Swallow Able to elicit          Oral Motor/Sensory Function - 10/27/16 1859      Oral Motor/Sensory Function   Overall Oral Motor/Sensory Function Moderate impairment   Facial ROM Within Functional Limits   Lingual ROM Suspected CN XII (hypoglossal) dysfunction;Reduced left;Reduced right   Lingual Symmetry Within Functional Limits   Lingual Strength Reduced;Suspected CN XII (hypoglossal) dysfunction   Lingual Sensation Within Functional Limits   Velum Within Functional Limits   Mandible Within Functional Limits          Thin Liquid - 10/27/16 1859      Thin Liquid   Thin Liquid Impaired   Presentation Cup;Self Fed   Pharyngeal  Phase Impairments Suspected delayed Swallow;Cough - Immediate         Nectar thick liquid - 10/27/16 1900      Nectar Thick Liquid   Nectar Thick Liquid Not tested          Puree - 10/27/16 1900      Puree   Puree Within functional limits   Presentation Self Fed;Spoon         Solid - 10/27/16 1900      Solid   Solid Impaired   Presentation Self Fed;Spoon   Oral Phase Impairments Reduced lingual movement/coordination   Oral Phase Functional Implications Oral residue             SLP Education - 10/27/16 1856    Education provided Yes   Education Details Review of aspiration and reflux precautions regarding most  recent MBSS on 10/19/2016 with recommendation for puree/HTL   Person(s) Educated Patient;Spouse   Methods Explanation   Comprehension Verbalized understanding;Need further instruction          SLP Short Term Goals - 10/27/16 1907      SLP SHORT TERM GOAL #1   Title Pt. will participate in clinical swallow evaluation next session and likely MBSS in 1-2 weeks   Baseline last MBS 10/19/2016 with rec for puree and HTL   Time 2   Period Weeks  Status Partially Met     SLP SHORT TERM GOAL #2   Title Pt will accurately produce requested sounds in 2-3 syllable words (alveolars) with 80% acc with reps of 5 each with mod visual cue from SLP.   Baseline 50% accuracy    Time 8   Period Weeks   Status On-going     SLP SHORT TERM GOAL #3   Title Pt. will complete oral motor exercises, pharyngeal strengthening, and lingual ROM exercises with accuracy in 8/10 trials given minimal feedback.    Baseline 50% accuracy    Time 8   Period Weeks   Status On-going     SLP SHORT TERM GOAL #4   Title Pt. will verbalize response to hypothetical problem solving situations with 90% acc given current safety precautions with min assistance.    Baseline 70% accuracy    Time 8   Period Weeks   Status Not Met          SLP Long Term Goals - 10/26/16 1809      SLP LONG TERM GOAL #1   Title Pt. will increase speech intelligibility to 85% during conversational exchanges with no assistance.    Baseline 60% accuracy    Time 8   Period Weeks   Status Achieved     SLP LONG TERM GOAL #2   Title Pt. will complete expressive language tasks with 85% accuracy and no assistance.    Baseline 20% accuracy    Time 8   Period Weeks   Status Achieved     SLP LONG TERM GOAL #3   Title Pt. will safelty swallow least restrictive diet with no s/s of aspiration in 9/10 trials and no cues.    Baseline 50% accuracy    Time 8   Period Weeks   Status Achieved          Plan - 10/27/16 1901    Clinical  Impression Statement Clinical swallow evaluation completed this date. Pt was discharged home from the hospital on D1/puree and HTL via cup sips, however her husband reports that she has been drinking some thin  liquids at home and eating soft solids. Pt appears to have made improvements in oral motor strength and ROM, however lingual deficits persist. Suspect improved swallow initiation response time, however some strong coughing noted with thin water (taken after oral care). Pt consumed mech soft textures with mild oral residue resulting. Pt was encouraged to continue with HTL at home and OK for trials small sips of thin water or ice chips after oral care only. Pt is going to [facility] with husband this weekend for one week. Signs and symptoms of aspiration PNA were reviewed with pt and spouse and [place] if Pt noted to become febrile, SOB, or increased congestion. Husband reports that Pt has had a cough sinice she was initially hospitalized. Would like to complete MBSS in the next 1-2 weeks. Continue POC.    Speech Therapy Frequency 2x / week   Duration --  8 weeks   Treatment/Interventions Aspiration precaution training;Diet toleration management by SLP;SLP instruction and feedback;Compensatory strategies;Patient/family education;Oral motor exercises;Trials of upgraded texture/liquids   Potential to Achieve Goals Good   Potential Considerations Previous level of function   SLP Home Exercise Plan Pt will complete HEP as assigned to facilitate carryover of treatment strategies and techniques in home environment with assist from spouse.   Consulted and Agree with Plan of Care Patient  Family Member Consulted patient's husband       Patient will benefit from skilled therapeutic intervention in order to improve the following deficits and impairments:   Dysarthria following other cerebrovascular disease  Cognitive communication deficit  Dysphagia,  oropharyngeal phase      G-Codes - 2016/11/08 1809    Functional Assessment Tool Used skilled observation    Functional Limitations Motor speech   Motor Speech Current Status [phone]) At least 40 percent but less than 60 percent impaired, limited or restricted   Motor Speech Goal Status ([id number]) At least 20 percent but less than 40 percent impaired, limited or restricted      Problem List Patient Active Problem List   Diagnosis Date Noted  . Dysmetria   . Diabetes mellitus type 2 in nonobese (HCC)   . Supplemental oxygen dependent   . Benign essential HTN   . Hypokalemia   . Hypoalbuminemia due to protein-calorie malnutrition ([place])   . Embolic stroke of right basal ganglia ([place]) 10/12/2016  . Oropharyngeal dysphagia   . Stroke ([place]) 10/09/2016  . Stroke (cerebrum) ([facility]) 10/09/2016  . 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 Frequent falls 04/21/2016  . 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 Type 2 diabetes mellitus with peripheral neuropathy (HCC)   . Fibromyalgia   . Chronic pain syndrome   . Chronic obstructive pulmonary disease ([place])   . Gait disturbance, post-stroke   . Dysarthria, post-stroke   . Dysphagia, post-stroke   . Cerebrovascular accident (CVA) due to occlusion of basilar artery ([place])   . Mixed hyperlipidemia   . 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  . Allergic rhinitis 12/17/2015  . Primary osteoarthritis of both knees 11/19/2015  . Obesity (BMI 30-39.9) 09/04/2015  . Essential hypertension 09/04/2015  . Constipation 09/04/2015  . Asthma 08/26/2015  . Raynaud's phenomenon 08/26/2015  . Gastro-esophageal reflux disease with esophagitis 08/26/2015  . Spinal stenosis 08/26/2015  . Vitamin D deficiency 08/26/2015  . Diabetes mellitus, type 2 ([place]) 08/22/2015  . HNP (herniated nucleus pulposus), cervical 07/10/2015  . Cervical radicular pain 04/30/2013  . Knee osteoarthritis 01/05/2012   Thank  you,  Genene Churn, [place]  [facility] 10/27/2016, 7:08 PM  [place] [address] [place], [place], [address] Phone: [phone]   Fax:  [phone]  Name: Susan Patel MRN: [id number] Date of Birth: 1940/06/12

## 2016-10-28 ENCOUNTER — Encounter: Payer: Medicare Other | Admitting: Physical Medicine & Rehabilitation

## 2016-10-29 ENCOUNTER — Encounter: Payer: Self-pay | Admitting: Pediatrics

## 2016-10-29 ENCOUNTER — Ambulatory Visit (INDEPENDENT_AMBULATORY_CARE_PROVIDER_SITE_OTHER): Payer: Medicare Other | Admitting: Pediatrics

## 2016-10-29 VITALS — BP 137/73 | HR 70 | Temp 96.7°F | Ht 61.0 in | Wt 186.0 lb

## 2016-10-29 DIAGNOSIS — G8929 Other chronic pain: Secondary | ICD-10-CM | POA: Diagnosis not present

## 2016-10-29 DIAGNOSIS — I10 Essential (primary) hypertension: Secondary | ICD-10-CM | POA: Diagnosis not present

## 2016-10-29 DIAGNOSIS — I6930 Unspecified sequelae of cerebral infarction: Secondary | ICD-10-CM

## 2016-10-29 DIAGNOSIS — R232 Flushing: Secondary | ICD-10-CM

## 2016-10-29 MED ORDER — GABAPENTIN 300 MG PO CAPS: 300.0000 mg | ORAL_CAPSULE | Freq: Two times a day (BID) | ORAL | 3 refills | Status: DC

## 2016-10-29 MED ORDER — TRIAMCINOLONE 0.1 % CREAM:EUCERIN CREAM 1:1: 1.0000 "application " | TOPICAL_CREAM | Freq: Two times a day (BID) | CUTANEOUS | 0 refills | Status: DC

## 2016-10-29 MED ORDER — TRAMADOL HCL 50 MG PO TABS: 50.0000 mg | ORAL_TABLET | Freq: Three times a day (TID) | ORAL | 2 refills | Status: DC | PRN

## 2016-10-29 NOTE — Progress Notes (Signed)
  Subjective:   Patient ID: Susan Patel, female    DOB: February 26, 1941, 76 y.o.   MRN: [id number] CC: Hospitalization Follow-up (Stroke, Discharged 1 week ago )  HPI: [name] is a 76 y.o. female presenting for Hospitalization Follow-up (Stroke, Discharged 1 week ago )  Pt admitted 5/26-6/6 first to hospital then rehab with embolic stroke R basal ganglia. Stroke thought to be thrombotic due to small vessel disease. ASA changed to plavix.  Pt with dysarthria, expressive deficits. Now in speech and physical therapy. Still on thickened liquids. No HA, no CP.  I have reviewed the hospital records.   Here today with husband. Pt says since discharge she has been doing fine, continuing therapies. No falls at home. Wearing heels today in clinic but husband says she is usually in sneakers at home Speech and altered diet due to aspiration risk has been bothering her the most.  DM2: amaryl was discontinued in the hospital due to low BGLs  Continues to have intermittent pain in legs Taking tramadol 2-3 times a day as needed  HTN: taking BP meds regularly  Post-menopausal: still with some hotflashes, she thinks better since starting gabapentin  Relevant past medical, surgical, family and social history reviewed. Allergies and medications reviewed and updated. History  Smoking Status  . Never Smoker  Smokeless Tobacco  . Never Used   ROS: Per HPI   Objective:    BP 137/73   Pulse 70   Temp (!) 96.7 F (35.9 C) (Oral)   Ht '5\' 1"'$  (1.549 m)   Wt 186 lb (84.4 kg)   BMI 35.14 kg/m   Wt Readings from Last 3 Encounters:  10/29/16 186 lb (84.4 kg)  10/20/16 185 lb 0.9 oz (83.9 kg)  10/10/16 194 lb 4.8 oz (88.1 kg)    Gen: NAD, alert, cooperative with exam, NCAT EYES: EOMI, no conjunctival injection, or no icterus CV: NRRR, normal S1/S2, no murmur, distal pulses 2+ b/l Resp: CTABL, no wheezes, normal WOB Ext: No edema, warm Neuro: Alert, speech dysarthric, sensation intact  b/l UE/LE, hand grip 5/5 b/l Psych: well dressed, slightly masked facies, mood is "good"  Assessment & Plan:  Jamieson was seen today for hospitalization follow-up.  Diagnoses and all orders for this visit:  Essential hypertension Slightly elevated SBP today, cont losartan Cont to follow -     BMP8+EGFR  Other chronic pain Below helps with leg pain, improves quality of life Husband helping with her medications now -     traMADol (ULTRAM) 50 MG tablet; Take 1 tablet (50 mg total) by mouth every 8 (eight) hours as needed for moderate pain or severe pain.  Hot flashes Hot flashes better with below, can try morning and night, still with some symptoms -     gabapentin (NEURONTIN) 300 MG capsule; Take 1 capsule (300 mg total) by mouth 2 (two) times daily.  Late effect of cerebrovascular accident (CVA) Following with neurology, now on plavix Continuing speech, PT  Follow up plan: 3 mo, sooner if needed Assunta Found, MD [place]

## 2016-10-30 LAB — BMP8+EGFR
BUN / CREAT RATIO: 21 (ref 12–28)
BUN: 24 mg/dL (ref 8–27)
CALCIUM: 9.5 mg/dL (ref 8.7–10.3)
CHLORIDE: 98 mmol/L (ref 96–106)
CO2: 29 mmol/L (ref 20–29)
Creatinine, Ser: 1.15 mg/dL — ABNORMAL HIGH (ref 0.57–1.00)
GFR calc non Af Amer: 47 mL/min/{1.73_m2} — ABNORMAL LOW (ref >59)
GFR, EST AFRICAN AMERICAN: 55 mL/min/{1.73_m2} — AB (ref >59)
Glucose: 64 mg/dL — ABNORMAL LOW (ref 65–99)
Potassium: 4.1 mmol/L (ref 3.5–5.2)
Sodium: 142 mmol/L (ref 134–144)

## 2016-11-01 ENCOUNTER — Other Ambulatory Visit: Payer: Self-pay | Admitting: Pediatrics

## 2016-11-01 DIAGNOSIS — M797 Fibromyalgia: Secondary | ICD-10-CM

## 2016-11-09 ENCOUNTER — Ambulatory Visit (HOSPITAL_COMMUNITY): Payer: Medicare Other | Admitting: Physical Therapy

## 2016-11-09 DIAGNOSIS — R296 Repeated falls: Secondary | ICD-10-CM

## 2016-11-09 DIAGNOSIS — M6281 Muscle weakness (generalized): Secondary | ICD-10-CM

## 2016-11-09 DIAGNOSIS — R2681 Unsteadiness on feet: Secondary | ICD-10-CM

## 2016-11-09 NOTE — Therapy (Signed)
[facility] [address] [place], [place], [address] Phone: [phone]   Fax:  [phone]  Physical Therapy Treatment  Patient Details  Name: Susan Patel MRN: [id number] Date of Birth: 1940/11/03 Referring Provider: Lauraine Rinne  Encounter Date: 11/09/2016      PT End of Session - 11/09/16 1512    Visit Number 3   Number of Visits 8   Date for PT Re-Evaluation 11/24/16   Authorization Type [employer]   Authorization - Visit Number 3   Authorization - Number of Visits 8   PT Start Time 1430   PT Stop Time 1510   PT Time Calculation (min) 40 min   Equipment Utilized During Treatment Gait belt   Activity Tolerance Patient tolerated treatment well   Behavior During Therapy [facility] for tasks assessed/performed      Past Medical History:  Diagnosis Date  . Arthritis    'all over"  . Asthma   . Chronic pain   . COPD (chronic obstructive pulmonary disease) (HCC)    O2 per [place] at nights   . Depression   . Diabetes mellitus without complication ([place])    borderline- , states she was on med., but MD told her "everything is under control so I threw the bottle away"  . Fibromyalgia   . GERD (gastroesophageal reflux disease)   . Hypertension   . Neuromuscular disorder (HCC)    parkinson, neuropathy- both feet & hands.  . Stroke [facility])    residual dysarthria    Past Surgical History:  Procedure Laterality Date  . ABDOMINAL HYSTERECTOMY    . ANTERIOR CERVICAL DECOMP/DISCECTOMY FUSION N/A 07/10/2015   Procedure: Cervical five-six, Cervical six-seven anterior cervical decompression with fusion plating and bonegraft;  Surgeon: Jovita Gamma, MD;  Location: [place] [facility];  Service: Neurosurgery;  Laterality: N/A;  . APPENDECTOMY    . BIOPSY EYE MUSCLE  03/20/2016   biopsy vessel to right eye due to swelling  . EYE SURGERY Bilateral    cataracts removed, /w "cyrstal lenses"   . SHOULDER ARTHROSCOPY Right    x2   RCR- spurs  removed     There were no vitals filed for this visit.      Subjective Assessment - 11/09/16 1428    Subjective Pt is doing her exercises most the time    Pertinent History Fibromyalgia, Lt CVA, DM, HTN , parkinson   Patient Stated Goals To wash clothes, cook and do her housework    Currently in Pain? No/denies                         [facility] Adult PT Treatment/Exercise - 11/09/16 0001      Bed Mobility   Supine to Sit 5: Supervision   Sit to Supine 5: Supervision     Knee/Hip Exercises: Standing   Forward Lunges Right;Left;10 reps             Balance Exercises - 11/09/16 1455      Balance Exercises: Standing   Standing Eyes Opened Narrow base of support (BOS);Head turns   Tandem Stance Eyes open;2 reps   Tandem Gait Forward;2 reps   Retro Gait 2 reps   Sidestepping 2 reps;Theraband   Cone Rotation Solid surface;Right turn;Left turn   Marching Limitations 10   Toe Raise Limitations 10   Sit to Stand Time 10             PT Short Term  Goals - 11/09/16 1517      PT SHORT TERM GOAL #1   Title Pt to be able to single leg stance for at least 10 seconds on both legs to reduce risk of falling    Time 2   Period Weeks   Status On-going     PT SHORT TERM GOAL #2   Title Pt to state that she is completing light housework activites    Time 2   Period Weeks   Status On-going     PT SHORT TERM GOAL #3   Title Pt to be I in all bed mobility aspects.    Time 2   Period Weeks   Status On-going           PT Long Term Goals - 11/09/16 1517      PT LONG TERM GOAL #1   Title Pt  leg strength to be increased one grade to allow pt to go up and down 5 steps with no difficulty   Time 4   Period Weeks   Status On-going     PT LONG TERM GOAL #2   Title Berg balance test to be at least 54 to allow pt to feel confident walking on uneven terrain.    Time 4   Period Weeks   Status On-going     PT LONG TERM GOAL #3   Title Pt to be able to walk  for over 40 minutes to be able to complete the grocery shopping    Time 4   Period Weeks   Status On-going     PT LONG TERM GOAL #4   Title Pt to be confident vacuuming .    Time 4   Period Weeks   Status On-going               Plan - 11/09/16 1513    Clinical Impression Statement Treatment focused mainly on Balance with min assist needed to keep pt in balance.  Added several new activites as outlined above.  Pt will benefit from closed chain exercises to promote balance and strength.  Therapist and pt reviewed bed mobility with pt needing verbal /tactile cuing to complete with mod .     Rehab Potential Good   PT Frequency 2x / week   PT Duration 4 weeks   PT Treatment/Interventions ADLs/Self Care Home Management;Patient/family education;Therapeutic exercise;Balance training;Neuromuscular re-education;Therapeutic activities;Gait training;Stair training   PT Next Visit Plan ; SLS, Seated core work with R LE crossed over [place].    Consulted and Agree with Plan of Care Patient      Patient will benefit from skilled therapeutic intervention in order to improve the following deficits and impairments:  Decreased activity tolerance, Decreased balance, Decreased mobility, Difficulty walking, Decreased strength, Pain, Improper body mechanics, Postural dysfunction  Visit Diagnosis: Unsteadiness on feet  Muscle weakness (generalized)  Repeated falls     Problem List Patient Active Problem List   Diagnosis Date Noted  . Dysmetria   . Diabetes mellitus type 2 in nonobese (HCC)   . Supplemental oxygen dependent   . Benign essential HTN   . Hypokalemia   . Hypoalbuminemia due to protein-calorie malnutrition ([place])   . Embolic stroke of right basal ganglia ([place]) 10/12/2016  . Oropharyngeal dysphagia   . Stroke ([place]) 10/09/2016  . Stroke (cerebrum) ([place]) 10/09/2016  . 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 Frequent falls 04/21/2016  . 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 Type 2 diabetes mellitus with peripheral neuropathy (HCC)   . Fibromyalgia   . Chronic pain syndrome   . Chronic obstructive pulmonary disease ([place])   . Gait disturbance, post-stroke   . Dysarthria, post-stroke   . Dysphagia, post-stroke   . 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 Mixed hyperlipidemia   . 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  . Allergic rhinitis 12/17/2015  . Primary osteoarthritis of both knees 11/19/2015  . Obesity (BMI 30-39.9) 09/04/2015  . Essential hypertension 09/04/2015  . Constipation 09/04/2015  . Asthma 08/26/2015  . Raynaud's phenomenon 08/26/2015  . Gastro-esophageal reflux disease with esophagitis 08/26/2015  . Spinal stenosis 08/26/2015  . Vitamin D deficiency 08/26/2015  . Diabetes mellitus, type 2 ([place]) 08/22/2015  . HNP (herniated nucleus pulposus), cervical 07/10/2015  . Cervical radicular pain 04/30/2013  . Knee osteoarthritis 01/05/2012    Rayetta Humphrey, PT CLT [phone] 11/09/2016, 3:18 PM  [facility] [address] [place], [place], [address] Phone: [phone]   Fax:  [phone]  Name: Susan Patel MRN: [id number] Date of Birth: 1940/06/15

## 2016-11-12 ENCOUNTER — Ambulatory Visit (HOSPITAL_COMMUNITY): Payer: Medicare Other | Admitting: Physical Therapy

## 2016-11-12 DIAGNOSIS — R2681 Unsteadiness on feet: Secondary | ICD-10-CM | POA: Diagnosis not present

## 2016-11-12 DIAGNOSIS — M6281 Muscle weakness (generalized): Secondary | ICD-10-CM

## 2016-11-12 DIAGNOSIS — R296 Repeated falls: Secondary | ICD-10-CM

## 2016-11-12 NOTE — Therapy (Signed)
[facility] [place], [place], [address] Phone: [phone]   Fax:  [phone]  Physical Therapy Treatment  Patient Details  Name: Susan Patel MRN: [id number] Date of Birth: January 17, 1941 Referring Provider: Lauraine Rinne  Encounter Date: 11/12/2016      PT End of Session - 11/12/16 1114    Visit Number 4   Number of Visits 8   Date for PT Re-Evaluation 11/24/16   Authorization Type [employer]   Authorization - Visit Number 4   Authorization - Number of Visits 8   PT Start Time 1033   PT Stop Time 1113   PT Time Calculation (min) 40 min   Equipment Utilized During Treatment Gait belt   Activity Tolerance Patient tolerated treatment well   Behavior During Therapy [facility] for tasks assessed/performed      Past Medical History:  Diagnosis Date  . Arthritis    'all over"  . Asthma   . Chronic pain   . COPD (chronic obstructive pulmonary disease) (HCC)    O2 per [place] at nights   . Depression   . Diabetes mellitus without complication ([place])    borderline- , states she was on med., but MD told her "everything is under control so I threw the bottle away"  . Fibromyalgia   . GERD (gastroesophageal reflux disease)   . Hypertension   . Neuromuscular disorder (HCC)    parkinson, neuropathy- both feet & hands.  . Stroke [facility])    residual dysarthria    Past Surgical History:  Procedure Laterality Date  . ABDOMINAL HYSTERECTOMY    . ANTERIOR CERVICAL DECOMP/DISCECTOMY FUSION N/A 07/10/2015   Procedure: Cervical five-six, Cervical six-seven anterior cervical decompression with fusion plating and bonegraft;  Surgeon: Jovita Gamma, MD;  Location: [place] [facility];  Service: Neurosurgery;  Laterality: N/A;  . APPENDECTOMY    . BIOPSY EYE MUSCLE  03/20/2016   biopsy vessel to right eye due to swelling  . EYE SURGERY Bilateral    cataracts removed, /w "cyrstal lenses"   . SHOULDER ARTHROSCOPY Right    x2   RCR- spurs  removed     There were no vitals filed for this visit.      Subjective Assessment - 11/12/16 1055    Subjective Pt states that she is not having difficulty with activity at home.  Most concern is her talking    Pertinent History Fibromyalgia, Lt CVA, DM, HTN , parkinson   Patient Stated Goals To wash clothes, cook and do her housework    Currently in Pain? No/denies                              Balance Exercises - 11/12/16 1056      Balance Exercises: Standing   Tandem Stance Eyes open;Foam/compliant surface;3 reps   SLS Eyes open;3 reps   Tandem Gait Forward;Foam/compliant surface;2 reps   Retro Gait 2 reps   Sidestepping 2 reps;Theraband   Step Over Hurdles / Cones small then large hurdles forwards and lateral x3 cues for form    Marching Limitations 10   Sit to Stand Time 10   Other Standing Exercises sitting Rt crossed over LT reach for cones              PT Short Term Goals - 11/09/16 1517      PT SHORT TERM GOAL #1   Title Pt to be able to  single leg stance for at least 10 seconds on both legs to reduce risk of falling    Time 2   Period Weeks   Status On-going     PT SHORT TERM GOAL #2   Title Pt to state that she is completing light housework activites    Time 2   Period Weeks   Status On-going     PT SHORT TERM GOAL #3   Title Pt to be I in all bed mobility aspects.    Time 2   Period Weeks   Status On-going           PT Long Term Goals - 11/09/16 1517      PT LONG TERM GOAL #1   Title Pt  leg strength to be increased one grade to allow pt to go up and down 5 steps with no difficulty   Time 4   Period Weeks   Status On-going     PT LONG TERM GOAL #2   Title Berg balance test to be at least 54 to allow pt to feel confident walking on uneven terrain.    Time 4   Period Weeks   Status On-going     PT LONG TERM GOAL #3   Title Pt to be able to walk for over 40 minutes to be able to complete the grocery shopping     Time 4   Period Weeks   Status On-going     PT LONG TERM GOAL #4   Title Pt to be confident vacuuming .    Time 4   Period Weeks   Status On-going               Plan - 11/12/16 1116    Clinical Impression Statement Pt becomes distracted easily with other activity in the gym space.  Pt has difficulty keeping shoulders over BOS throughout balance activity with pt shoulders going behind her base of support.    Rehab Potential Good   PT Frequency 2x / week   PT Duration 4 weeks   PT Treatment/Interventions ADLs/Self Care Home Management;Patient/family education;Therapeutic exercise;Balance training;Neuromuscular re-education;Therapeutic activities;Gait training;Stair training   PT Next Visit Plan begin wall bumps    Consulted and Agree with Plan of Care Patient      Patient will benefit from skilled therapeutic intervention in order to improve the following deficits and impairments:  Decreased activity tolerance, Decreased balance, Decreased mobility, Difficulty walking, Decreased strength, Pain, Improper body mechanics, Postural dysfunction  Visit Diagnosis: Unsteadiness on feet  Muscle weakness (generalized)  Repeated falls     Problem List Patient Active Problem List   Diagnosis Date Noted  . Dysmetria   . Diabetes mellitus type 2 in nonobese (HCC)   . Supplemental oxygen dependent   . Benign essential HTN   . Hypokalemia   . Hypoalbuminemia due to protein-calorie malnutrition ([place])   . Embolic stroke of right basal ganglia ([place]) 10/12/2016  . Oropharyngeal dysphagia   . Stroke ([place]) 10/09/2016  . Stroke (cerebrum) ([place]) 10/09/2016  . 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 Frequent falls 04/21/2016  . 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 Type 2 diabetes mellitus with peripheral neuropathy (HCC)   . Fibromyalgia   . Chronic pain syndrome   . Chronic obstructive pulmonary disease ([place])   . Gait disturbance,  post-stroke   . Dysarthria, post-stroke   . Dysphagia, post-stroke   . Cerebrovascular accident (CVA) due  to occlusion of basilar artery ([place])   . Mixed hyperlipidemia   . 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  . Allergic rhinitis 12/17/2015  . Primary osteoarthritis of both knees 11/19/2015  . Obesity (BMI 30-39.9) 09/04/2015  . Essential hypertension 09/04/2015  . Constipation 09/04/2015  . Asthma 08/26/2015  . Raynaud's phenomenon 08/26/2015  . Gastro-esophageal reflux disease with esophagitis 08/26/2015  . Spinal stenosis 08/26/2015  . Vitamin D deficiency 08/26/2015  . Diabetes mellitus, type 2 ([place]) 08/22/2015  . HNP (herniated nucleus pulposus), cervical 07/10/2015  . Cervical radicular pain 04/30/2013  . Knee osteoarthritis 01/05/2012    Rayetta Humphrey, PT CLT [phone] 11/12/2016, 11:18 AM  [facility] [address] [place], [place], [address] Phone: [phone]   Fax:  [phone]  Name: Susan Patel MRN: [id number] Date of Birth: 1940-10-19

## 2016-11-15 ENCOUNTER — Ambulatory Visit (HOSPITAL_COMMUNITY): Payer: Medicare Other | Admitting: Speech Pathology

## 2016-11-15 ENCOUNTER — Other Ambulatory Visit: Payer: Self-pay | Admitting: Pediatrics

## 2016-11-15 ENCOUNTER — Ambulatory Visit (HOSPITAL_COMMUNITY): Payer: Medicare Other | Attending: Physical Medicine and Rehabilitation | Admitting: Physical Therapy

## 2016-11-15 ENCOUNTER — Encounter (HOSPITAL_COMMUNITY): Payer: Self-pay | Admitting: Speech Pathology

## 2016-11-15 DIAGNOSIS — M6281 Muscle weakness (generalized): Secondary | ICD-10-CM | POA: Diagnosis present

## 2016-11-15 DIAGNOSIS — R1312 Dysphagia, oropharyngeal phase: Secondary | ICD-10-CM | POA: Diagnosis present

## 2016-11-15 DIAGNOSIS — R2681 Unsteadiness on feet: Secondary | ICD-10-CM | POA: Diagnosis present

## 2016-11-15 DIAGNOSIS — R41841 Cognitive communication deficit: Secondary | ICD-10-CM | POA: Diagnosis present

## 2016-11-15 DIAGNOSIS — I69822 Dysarthria following other cerebrovascular disease: Secondary | ICD-10-CM | POA: Diagnosis present

## 2016-11-15 DIAGNOSIS — R29898 Other symptoms and signs involving the musculoskeletal system: Secondary | ICD-10-CM | POA: Diagnosis present

## 2016-11-15 DIAGNOSIS — R278 Other lack of coordination: Secondary | ICD-10-CM | POA: Diagnosis present

## 2016-11-15 DIAGNOSIS — R296 Repeated falls: Secondary | ICD-10-CM

## 2016-11-15 NOTE — Therapy (Signed)
[facility] [place], [place], [address] Phone: [phone]   Fax:  [phone]  Physical Therapy Treatment  Patient Details  Name: Susan Patel MRN: [id number] Date of Birth: 03/15/1941 Referring Provider: Lauraine Rinne  Encounter Date: 11/15/2016      PT End of Session - 11/15/16 1515    Visit Number 5   Number of Visits 8   Date for PT Re-Evaluation 11/24/16   Authorization Type [employer]   Authorization - Visit Number 5   Authorization - Number of Visits 8   PT Start Time 2620   PT Stop Time 1510   PT Time Calculation (min) 38 min   Equipment Utilized During Treatment Gait belt   Activity Tolerance Patient tolerated treatment well   Behavior During Therapy [facility] for tasks assessed/performed      Past Medical History:  Diagnosis Date  . Arthritis    'all over"  . Asthma   . Chronic pain   . COPD (chronic obstructive pulmonary disease) (HCC)    O2 per [place] at nights   . Depression   . Diabetes mellitus without complication ([place])    borderline- , states she was on med., but MD told her "everything is under control so I threw the bottle away"  . Fibromyalgia   . GERD (gastroesophageal reflux disease)   . Hypertension   . Neuromuscular disorder (HCC)    parkinson, neuropathy- both feet & hands.  . Stroke [facility])    residual dysarthria    Past Surgical History:  Procedure Laterality Date  . ABDOMINAL HYSTERECTOMY    . ANTERIOR CERVICAL DECOMP/DISCECTOMY FUSION N/A 07/10/2015   Procedure: Cervical five-six, Cervical six-seven anterior cervical decompression with fusion plating and bonegraft;  Surgeon: Jovita Gamma, MD;  Location: [facility];  Service: Neurosurgery;  Laterality: N/A;  . APPENDECTOMY    . BIOPSY EYE MUSCLE  03/20/2016   biopsy vessel to right eye due to swelling  . EYE SURGERY Bilateral    cataracts removed, /w "cyrstal lenses"   . SHOULDER ARTHROSCOPY Right    x2   RCR- spurs  removed     There were no vitals filed for this visit.      Subjective Assessment - 11/15/16 1513    Subjective Pt accompanied by spouse and just finishing ST.  Reports she is leaving for [facility] Thursday and is just returning from vacation.   Currently in Pain? No/denies                              Balance Exercises - 11/15/16 1513      Balance Exercises: Standing   Tandem Stance Eyes open;Foam/compliant surface;3 reps   SLS Eyes open;3 reps  max Lt: 6", Rt: 12"   Balance Beam forward 2RT   Tandem Gait Forward;2 reps   Retro Gait 2 reps   Sidestepping 2 reps;Theraband  red theraband   Step Over Hurdles / Cones small then large hurdles forwards and lateral x3 cues for form    Marching Limitations 10  high without UE assist at bars   Sit to Stand Time 10           PT Education - 11/15/16 1517    Education provided Yes   Education Details safety awareness, using UE to sit, looking ahead not down with ambulation   Person(s) Educated Patient;Spouse   Methods Explanation   Comprehension Verbalized understanding  PT Short Term Goals - 11/09/16 1517      PT SHORT TERM GOAL #1   Title Pt to be able to single leg stance for at least 10 seconds on both legs to reduce risk of falling    Time 2   Period Weeks   Status On-going     PT SHORT TERM GOAL #2   Title Pt to state that she is completing light housework activites    Time 2   Period Weeks   Status On-going     PT SHORT TERM GOAL #3   Title Pt to be I in all bed mobility aspects.    Time 2   Period Weeks   Status On-going           PT Long Term Goals - 11/09/16 1517      PT LONG TERM GOAL #1   Title Pt  leg strength to be increased one grade to allow pt to go up and down 5 steps with no difficulty   Time 4   Period Weeks   Status On-going     PT LONG TERM GOAL #2   Title Berg balance test to be at least 54 to allow pt to feel confident walking on uneven terrain.     Time 4   Period Weeks   Status On-going     PT LONG TERM GOAL #3   Title Pt to be able to walk for over 40 minutes to be able to complete the grocery shopping    Time 4   Period Weeks   Status On-going     PT LONG TERM GOAL #4   Title Pt to be confident vacuuming .    Time 4   Period Weeks   Status On-going               Plan - 11/15/16 1518    Clinical Impression Statement Worked on safety awareness this session as pateint has impulsivities.  PT also with difficulty restablishing balance once lost, requiring assistance.  balance beam most challenging with difficulty completing exercises in correct form.  Max cues needed for form and looking ahead instead of down.     Rehab Potential Good   PT Frequency 2x / week   PT Duration 4 weeks   PT Treatment/Interventions ADLs/Self Care Home Management;Patient/family education;Therapeutic exercise;Balance training;Neuromuscular re-education;Therapeutic activities;Gait training;Stair training   PT Next Visit Plan Continue to improve safety awarness.  Begin shoulder and hip wall bumps next session.    Consulted and Agree with Plan of Care Patient      Patient will benefit from skilled therapeutic intervention in order to improve the following deficits and impairments:  Decreased activity tolerance, Decreased balance, Decreased mobility, Difficulty walking, Decreased strength, Pain, Improper body mechanics, Postural dysfunction  Visit Diagnosis: Unsteadiness on feet  Muscle weakness (generalized)  Repeated falls     Problem List Patient Active Problem List   Diagnosis Date Noted  . Dysmetria   . Diabetes mellitus type 2 in nonobese (HCC)   . Supplemental oxygen dependent   . Benign essential HTN   . Hypokalemia   . Hypoalbuminemia due to protein-calorie malnutrition ([place])   . Embolic stroke of right basal ganglia ([place]) 10/12/2016  . Oropharyngeal dysphagia   . Stroke ([place]) 10/09/2016  . Stroke (cerebrum) ([place])  10/09/2016  . 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 Frequent falls 04/21/2016  . 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  Type 2 diabetes mellitus with peripheral neuropathy (HCC)   . Fibromyalgia   . Chronic pain syndrome   . Chronic obstructive pulmonary disease ([place])   . Gait disturbance, post-stroke   . Dysarthria, post-stroke   . Dysphagia, post-stroke   . 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 Mixed hyperlipidemia   . 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  . Allergic rhinitis 12/17/2015  . Primary osteoarthritis of both knees 11/19/2015  . Obesity (BMI 30-39.9) 09/04/2015  . Essential hypertension 09/04/2015  . Constipation 09/04/2015  . Asthma 08/26/2015  . Raynaud's phenomenon 08/26/2015  . Gastro-esophageal reflux disease with esophagitis 08/26/2015  . Spinal stenosis 08/26/2015  . Vitamin D deficiency 08/26/2015  . Diabetes mellitus, type 2 ([place]) 08/22/2015  . HNP (herniated nucleus pulposus), cervical 07/10/2015  . Cervical radicular pain 04/30/2013  . Knee osteoarthritis 01/05/2012   Teena Irani, PTA/CLT [phone]  Teena Irani 11/15/2016, 3:20 PM  [facility] [address] [place], [place], [address] Phone: [phone]   Fax:  [phone]  Name: Susan Patel MRN: [id number] Date of Birth: 1940/09/14

## 2016-11-15 NOTE — Therapy (Signed)
[place], [place], [address] Phone: [phone]   Fax:  [phone]  Speech Language Pathology Treatment  Patient Details  Name: Susan Patel MRN: [id number] Date of Birth: Oct 01, 1940 Referring Provider: Lauraine Rinne  Encounter Date: 11/15/2016      End of Session - 11/15/16 1554    Visit Number 3   Number of Visits 16   Date for SLP Re-Evaluation 12/23/16   Authorization Type [employer] Medicare   SLP Start Time 1345   SLP Stop Time  1430   SLP Time Calculation (min) 45 min   Activity Tolerance Patient tolerated treatment well      Past Medical History:  Diagnosis Date  . Arthritis    'all over"  . Asthma   . Chronic pain   . COPD (chronic obstructive pulmonary disease) (HCC)    O2 per [place] at nights   . Depression   . Diabetes mellitus without complication ([place])    borderline- , states she was on med., but MD told her "everything is under control so I threw the bottle away"  . Fibromyalgia   . GERD (gastroesophageal reflux disease)   . Hypertension   . Neuromuscular disorder (HCC)    parkinson, neuropathy- both feet & hands.  . Stroke [facility])    residual dysarthria    Past Surgical History:  Procedure Laterality Date  . ABDOMINAL HYSTERECTOMY    . ANTERIOR CERVICAL DECOMP/DISCECTOMY FUSION N/A 07/10/2015   Procedure: Cervical five-six, Cervical six-seven anterior cervical decompression with fusion plating and bonegraft;  Surgeon: Jovita Gamma, MD;  Location: [facility];  Service: Neurosurgery;  Laterality: N/A;  . APPENDECTOMY    . BIOPSY EYE MUSCLE  03/20/2016   biopsy vessel to right eye due to swelling  . EYE SURGERY Bilateral    cataracts removed, /w "cyrstal lenses"   . SHOULDER ARTHROSCOPY Right    x2   RCR- spurs removed     There were no vitals filed for this visit.      Subjective Assessment - 11/15/16 1549    Subjective "She is doing great with her swallowing 99% of the time."    Patient is accompained by: Family member   Currently in Pain? No/denies               ADULT SLP TREATMENT - 11/15/16 0001      General Information   Behavior/Cognition Alert   Patient Positioning Upright in chair   Oral care provided N/A   HPI Susan Patel is a 76 y.o. female COPD- oxygen dependent, T2DM, Neuropathy, chronic pain, CVA with residual visual deficits?, Parkinsonism; who was admitted on 10/09/16 with inability to speak, left sided weakness and difficulty walking. CT head done revealing suspicion of acute right basal ganglia infarct, subacute right occipital infarct, left basal infarct question age and extensive small vessel disease. She receive tPA and continues on ASA for secondary stroke prevention.  MRI brain done revealing acute infarcts right basal ganglia anterior right parietal white matter with chronic right occipital cortical infarct. CTA head/neck showed severe progressive advanced intracranial atherosclerosis.  2 D echo revealed EF 60-65%, with grade one diastolic dysfunction. Most recent MBSS was completed 10/19/2016 and she was started on dysphagia 1, honey liquids. ASA changed to plavix due to progressive intracranial atherosclerosis  and stroke felt to be thrombotic due to small vessel disease. She is referred for outpatient SLP to address cognitive communication deficits and dysphagia.  Treatment Provided   Treatment provided Cognitive-Linquistic     Cognitive-Linquistic Treatment   Treatment focused on Dysarthria;Patient/family/caregiver education   Skilled Treatment audio recording with pt self evaluation, emphasis on increasing vocal intensity, breath support, and adding pauses in speech     Assessment / Recommendations / [place] with current plan of care            SLP Short Term Goals - 11/15/16 1605      SLP SHORT TERM GOAL #1   Title Pt. will participate in clinical swallow evaluation next session and likely MBSS in 1-2  weeks   Baseline last MBS 10/19/2016 with rec for puree and HTL   Time 2   Period Weeks   Status Partially Met     SLP SHORT TERM GOAL #2   Title Pt will accurately produce requested sounds in 2-3 syllable words (alveolars) with 80% acc with reps of 5 each with mod visual cue from SLP.   Baseline 50% accuracy    Time 8   Period Weeks   Status On-going     SLP SHORT TERM GOAL #3   Title Pt. will complete oral motor exercises, pharyngeal strengthening, and lingual ROM exercises with accuracy in 8/10 trials given minimal feedback.    Baseline 50% accuracy    Time 8   Period Weeks   Status On-going     SLP SHORT TERM GOAL #4   Title Pt. will verbalize response to hypothetical problem solving situations with 90% acc given current safety precautions with min assistance.    Baseline 70% accuracy    Time 8   Period Weeks   Status Not Met          SLP Long Term Goals - 10/26/16 1809      SLP LONG TERM GOAL #1   Title Pt. will increase speech intelligibility to 85% during conversational exchanges with no assistance.    Baseline 60% accuracy    Time 8   Period Weeks   Status Achieved     SLP LONG TERM GOAL #2   Title Pt. will complete expressive language tasks with 85% accuracy and no assistance.    Baseline 20% accuracy    Time 8   Period Weeks   Status Achieved     SLP LONG TERM GOAL #3   Title Pt. will safelty swallow least restrictive diet with no s/s of aspiration in 9/10 trials and no cues.    Baseline 50% accuracy    Time 8   Period Weeks   Status Achieved          Plan - 11/15/16 1555    Clinical Impression Statement Pt accompanied to therapy by her husband. She has not been seen since the week of her evaluation due to her trip to [facility]. Her husband states that she has been doing "very well with swallowing". He reports that she is drinking thin liquids with no signs of distress. Pt is going out of town again [facility]). Both Mr. and Mrs. Bright expressed a  desire to continue work on speech intelligibility. SLP provided mod/max cues for implementation of strategies in structured tasks. She was initially cued to describe picture scenes with a single sentence and intelligibility was judged to be ~50%. Once pt provided scaffolded cues, intelligibility improved to 90% (mod/max cues for breath replenishing, increasing vocal intensity, and adding pauses between words). Continue POC.    Speech Therapy Frequency 2x / week   Duration --  8 weeks   Treatment/Interventions Aspiration precaution training;Diet toleration management by SLP;SLP instruction and feedback;Compensatory strategies;Patient/family education;Oral motor exercises;Trials of upgraded texture/liquids   Potential to Achieve Goals Good   Potential Considerations Previous level of function   SLP Home Exercise Plan Pt will complete HEP as assigned to facilitate carryover of treatment strategies and techniques in home environment with assist from spouse.   Consulted and Agree with Plan of Care Patient   Family Member Consulted patient's husband       Patient will benefit from skilled therapeutic intervention in order to improve the following deficits and impairments:   Dysarthria following other cerebrovascular disease  Cognitive communication deficit  Dysphagia, oropharyngeal phase    Problem List Patient Active Problem List   Diagnosis Date Noted  . Dysmetria   . Diabetes mellitus type 2 in nonobese (HCC)   . Supplemental oxygen dependent   . Benign essential HTN   . Hypokalemia   . Hypoalbuminemia due to protein-calorie malnutrition ([place])   . Embolic stroke of right basal ganglia ([place]) 10/12/2016  . Oropharyngeal dysphagia   . Stroke ([place]) 10/09/2016  . Stroke (cerebrum) ([place]) 10/09/2016  . 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 Frequent falls 04/21/2016  . 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 Type 2 diabetes mellitus with  peripheral neuropathy (HCC)   . Fibromyalgia   . Chronic pain syndrome   . Chronic obstructive pulmonary disease ([place])   . Gait disturbance, post-stroke   . Dysarthria, post-stroke   . Dysphagia, post-stroke   . 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 Mixed hyperlipidemia   . 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  . Allergic rhinitis 12/17/2015  . Primary osteoarthritis of both knees 11/19/2015  . Obesity (BMI 30-39.9) 09/04/2015  . Essential hypertension 09/04/2015  . Constipation 09/04/2015  . Asthma 08/26/2015  . Raynaud's phenomenon 08/26/2015  . Gastro-esophageal reflux disease with esophagitis 08/26/2015  . Spinal stenosis 08/26/2015  . Vitamin D deficiency 08/26/2015  . Diabetes mellitus, type 2 ([place]) 08/22/2015  . HNP (herniated nucleus pulposus), cervical 07/10/2015  . Cervical radicular pain 04/30/2013  . Knee osteoarthritis 01/05/2012   Thank you,  Genene Churn, [place]  [facility] 11/15/2016, 4:06 PM  [facility] [address] [place], [place], [address] Phone: [phone]   Fax:  [phone]   Name: Susan Patel MRN: [id number] Date of Birth: January 04, 1941

## 2016-11-16 ENCOUNTER — Other Ambulatory Visit: Payer: Self-pay | Admitting: Pediatrics

## 2016-11-16 NOTE — Telephone Encounter (Signed)
Husband aware that medication has been sent to pharmacy

## 2016-11-18 ENCOUNTER — Ambulatory Visit (HOSPITAL_COMMUNITY): Payer: Medicare Other | Admitting: Physical Therapy

## 2016-11-26 ENCOUNTER — Encounter: Payer: Medicare Other | Attending: Physical Medicine & Rehabilitation | Admitting: Physical Medicine & Rehabilitation

## 2016-11-26 ENCOUNTER — Encounter: Payer: Self-pay | Admitting: Physical Medicine & Rehabilitation

## 2016-11-26 VITALS — BP 123/75 | HR 68

## 2016-11-26 DIAGNOSIS — E119 Type 2 diabetes mellitus without complications: Secondary | ICD-10-CM

## 2016-11-26 DIAGNOSIS — K219 Gastro-esophageal reflux disease without esophagitis: Secondary | ICD-10-CM | POA: Insufficient documentation

## 2016-11-26 DIAGNOSIS — R278 Other lack of coordination: Secondary | ICD-10-CM | POA: Diagnosis not present

## 2016-11-26 DIAGNOSIS — G20 Parkinson's disease: Secondary | ICD-10-CM | POA: Diagnosis not present

## 2016-11-26 DIAGNOSIS — I6930 Unspecified sequelae of cerebral infarction: Secondary | ICD-10-CM | POA: Diagnosis not present

## 2016-11-26 DIAGNOSIS — J449 Chronic obstructive pulmonary disease, unspecified: Secondary | ICD-10-CM | POA: Insufficient documentation

## 2016-11-26 DIAGNOSIS — I69398 Other sequelae of cerebral infarction: Secondary | ICD-10-CM

## 2016-11-26 DIAGNOSIS — R1310 Dysphagia, unspecified: Secondary | ICD-10-CM | POA: Insufficient documentation

## 2016-11-26 DIAGNOSIS — I69322 Dysarthria following cerebral infarction: Secondary | ICD-10-CM

## 2016-11-26 DIAGNOSIS — I69391 Dysphagia following cerebral infarction: Secondary | ICD-10-CM

## 2016-11-26 DIAGNOSIS — E1140 Type 2 diabetes mellitus with diabetic neuropathy, unspecified: Secondary | ICD-10-CM | POA: Diagnosis not present

## 2016-11-26 DIAGNOSIS — Z9981 Dependence on supplemental oxygen: Secondary | ICD-10-CM

## 2016-11-26 DIAGNOSIS — I10 Essential (primary) hypertension: Secondary | ICD-10-CM | POA: Diagnosis not present

## 2016-11-26 DIAGNOSIS — R232 Flushing: Secondary | ICD-10-CM

## 2016-11-26 DIAGNOSIS — R350 Frequency of micturition: Secondary | ICD-10-CM | POA: Diagnosis not present

## 2016-11-26 DIAGNOSIS — G8929 Other chronic pain: Secondary | ICD-10-CM | POA: Insufficient documentation

## 2016-11-26 DIAGNOSIS — F329 Major depressive disorder, single episode, unspecified: Secondary | ICD-10-CM | POA: Diagnosis not present

## 2016-11-26 DIAGNOSIS — M797 Fibromyalgia: Secondary | ICD-10-CM

## 2016-11-26 DIAGNOSIS — I69319 Unspecified symptoms and signs involving cognitive functions following cerebral infarction: Secondary | ICD-10-CM | POA: Diagnosis not present

## 2016-11-26 DIAGNOSIS — R269 Unspecified abnormalities of gait and mobility: Secondary | ICD-10-CM

## 2016-11-26 DIAGNOSIS — I639 Cerebral infarction, unspecified: Secondary | ICD-10-CM

## 2016-11-26 DIAGNOSIS — G894 Chronic pain syndrome: Secondary | ICD-10-CM | POA: Diagnosis not present

## 2016-11-26 MED ORDER — GABAPENTIN 300 MG PO CAPS: 300.0000 mg | ORAL_CAPSULE | Freq: Three times a day (TID) | ORAL | 3 refills | Status: DC

## 2016-11-26 MED ORDER — TOLTERODINE TARTRATE 1 MG PO TABS: 1.0000 mg | ORAL_TABLET | Freq: Two times a day (BID) | ORAL | 1 refills | Status: DC

## 2016-11-26 NOTE — Progress Notes (Signed)
Subjective:    Patient ID: Susan Patel, female    DOB: [birth year], 76 y.o.   MRN: [id number]  HPI 76 y.o. female COPD- oxygen dependent, T2DM, Neuropathy, chronic pain, CVA with residual visual deficits?, Parkinsonism; who presents for hospital follow up after receiving CIR for right basal ganglia/parietal infarcts.   Admit date: 10/12/2016 Discharge date: 10/20/2016  Husband present, who provides some of history. Since discharge, she has not seen Neurology. She saw her PCP, who did a BMET. She continues to have neuropathy in hands/feet.  She has been checking her CBGs, which have been controlled. She continues to use supplemental O2 at night. BP controlled.  She is now eating regular thins, without being cleared by SLP. Had a fall in bathroom on vacation without injury.   Pain Inventory Average Pain 7 Pain Right Now 8 My pain is sharp, burning, stabbing, tingling and aching  In the last 24 hours, has pain interfered with the following? General activity 7 Relation with others 7 Enjoyment of life 7 What TIME of day is your pain at its worst? evening Sleep (in general) Poor  Pain is worse with: . Pain improves with: rest, therapy/exercise and pacing activities Relief from Meds: 7  Mobility walk without assistance ability to climb steps?  yes do you drive?  no  Function retired  Neuro/Psych bladder control problems weakness confusion  Prior Studies Any changes since last visit?  no  Physicians involved in your care Any changes since last visit?  no   Family History  Problem Relation Age of Onset  . Heart disease Mother   . Cancer Father   . Heart disease Father    Social History   Social History  . Marital status: Married    Spouse name: N/A  . Number of children: N/A  . Years of education: N/A   Social History Main Topics  . Smoking status: Never Smoker  . Smokeless tobacco: Never Used  . Alcohol use No  . Drug use: No  . Sexual activity: Yes    Other Topics Concern  . None   Social History Narrative  . None   Past Surgical History:  Procedure Laterality Date  . ABDOMINAL HYSTERECTOMY    . ANTERIOR CERVICAL DECOMP/DISCECTOMY FUSION N/A 07/10/2015   Procedure: Cervical five-six, Cervical six-seven anterior cervical decompression with fusion plating and bonegraft;  Surgeon: Jovita Gamma, MD;  Location: [place];  Service: Neurosurgery;  Laterality: N/A;  . APPENDECTOMY    . BIOPSY EYE MUSCLE  03/20/2016   biopsy vessel to right eye due to swelling  . EYE SURGERY Bilateral    cataracts removed, /w "cyrstal lenses"   . SHOULDER ARTHROSCOPY Right    x2   RCR- spurs removed    Past Medical History:  Diagnosis Date  . Arthritis    'all over"  . Asthma   . Chronic pain   . COPD (chronic obstructive pulmonary disease) (HCC)    O2 per [place] at nights   . Depression   . Diabetes mellitus without complication ([place])    borderline- , states she was on med., but MD told her "everything is under control so I threw the bottle away"  . Fibromyalgia   . GERD (gastroesophageal reflux disease)   . Hypertension   . Neuromuscular disorder (HCC)    parkinson, neuropathy- both feet & hands.  . Stroke ([place])    residual dysarthria   BP 123/75   Pulse 68   SpO2 92%  Opioid Risk Score:   Fall Risk Score:  `1  Depression screen PHQ 2/9  Depression screen [facility] 2/9 11/26/2016 10/29/2016 09/15/2016 08/30/2016 07/15/2016 05/13/2016 04/21/2016  Decreased Interest 0 0 0 0 0 0 0  Down, Depressed, Hopeless 0 0 0 0 0 0 0  PHQ - 2 Score 0 0 0 0 0 0 0  Altered sleeping 0 - - - - - -  Tired, decreased energy 2 - - - - - -  Change in appetite 0 - - - - - -  Feeling bad or failure about yourself  1 - - - - - -  Trouble concentrating 1 - - - - - -  Moving slowly or fidgety/restless 0 - - - - - -  Suicidal thoughts 0 - - - - - -  PHQ-9 Score 4 - - - - - -  Difficult doing work/chores Somewhat difficult - - - - - -     Review of Systems   Constitutional: Negative.   HENT: Negative.   Eyes: Negative.   Respiratory: Negative.   Cardiovascular: Negative.   Gastrointestinal: Positive for constipation.  Endocrine: Negative.   Genitourinary: Negative.   Musculoskeletal: Positive for arthralgias and myalgias.  Skin: Negative.   Allergic/Immunologic: Negative.   Neurological: Positive for speech difficulty and numbness.  Hematological: Negative.   Psychiatric/Behavioral: Positive for decreased concentration.  All other systems reviewed and are negative.     Objective:   Physical Exam Constitutional: She appears well-developed and well-nourished. NAD HENT: Normocephalic and atraumatic.  Eyes: EOMI. No discharge.  Cardiovascular: RRR. No JVD. Respiratory: Normal effort.  Clear.  GI: Soft. Bowel sounds are normal.   Musculoskeletal: She exhibits no edema or tenderness.  Neurological: She is alert and oriented.  Dyssarthria.  Right facial weakness.   Motor  B/l UE 4+/5 prox to distal.  B/l LE: 4+/5 prox to distal.  No dysmetria Sensation intact   Skin: Skin is warm and dry.  Psychiatric: Flat affect. Slowed.     Assessment & Plan:  76 y.o. female COPD- oxygen dependent, T2DM, Neuropathy, chronic pain, CVA with residual visual deficits?, Parkinsonism; who presents for hospital follow up after receiving CIR for right basal ganglia/parietal infarcts.   1.  Functional and swallowing deficits secondary to right basal ganglia /parietal infarcts on 5/26  Cont therapies  Follow up with Neurology- needs appointment  2. Chronic pain/Pain Management/Fibromyalgia:   Cont meds  Will increase Neurontin to 300 TID  3. T2DM:   Controlled at presetn  4. COPD  Cont meds  5. HTN  Controlled at present  Cont meds  6. Dysphagia  Pt independent started on Regular thins.  Per husband, no issues  Follow up with SLP  7. Urinary frequency  Tolterodine 1 mg BID trial

## 2016-11-26 NOTE — Patient Instructions (Signed)
Garvin Fila, MD.   Specialties:  Neurology Contact information: [address] [place] [address] [phone]

## 2016-11-30 ENCOUNTER — Ambulatory Visit (HOSPITAL_COMMUNITY): Payer: Medicare Other | Admitting: Physical Therapy

## 2016-11-30 DIAGNOSIS — R296 Repeated falls: Secondary | ICD-10-CM

## 2016-11-30 DIAGNOSIS — R2681 Unsteadiness on feet: Secondary | ICD-10-CM

## 2016-11-30 DIAGNOSIS — R278 Other lack of coordination: Secondary | ICD-10-CM

## 2016-11-30 DIAGNOSIS — M6281 Muscle weakness (generalized): Secondary | ICD-10-CM

## 2016-11-30 DIAGNOSIS — R29898 Other symptoms and signs involving the musculoskeletal system: Secondary | ICD-10-CM

## 2016-11-30 NOTE — Therapy (Signed)
[facility] [address] [place], [place], [address] Phone: [phone]   Fax:  [phone]  Physical Therapy Treatment  Patient Details  Name: Susan Patel MRN: [id number] Date of Birth: 05-13-1941 Referring Provider: Lauraine Rinne  Encounter Date: 11/30/2016    Past Medical History:  Diagnosis Date  . Arthritis    'all over"  . Asthma   . Chronic pain   . COPD (chronic obstructive pulmonary disease) (HCC)    O2 per [place] at nights   . Depression   . Diabetes mellitus without complication ([place])    borderline- , states she was on med., but MD told her "everything is under control so I threw the bottle away"  . Fibromyalgia   . GERD (gastroesophageal reflux disease)   . Hypertension   . Neuromuscular disorder (HCC)    parkinson, neuropathy- both feet & hands.  . Stroke [facility])    residual dysarthria    Past Surgical History:  Procedure Laterality Date  . ABDOMINAL HYSTERECTOMY    . ANTERIOR CERVICAL DECOMP/DISCECTOMY FUSION N/A 07/10/2015   Procedure: Cervical five-six, Cervical six-seven anterior cervical decompression with fusion plating and bonegraft;  Surgeon: Jovita Gamma, MD;  Location: [place] [facility];  Service: Neurosurgery;  Laterality: N/A;  . APPENDECTOMY    . BIOPSY EYE MUSCLE  03/20/2016   biopsy vessel to right eye due to swelling  . EYE SURGERY Bilateral    cataracts removed, /w "cyrstal lenses"   . SHOULDER ARTHROSCOPY Right    x2   RCR- spurs removed     There were no vitals filed for this visit.      Subjective Assessment - 11/30/16 1705    Subjective Pt accompanied by spouse.  STates she had a good time in [place] and had no difficulties or falls.   Patient is accompained by: Family member   Currently in Pain? No/denies                              Balance Exercises - 11/30/16 1528      Balance Exercises: Standing   Tandem Stance Eyes closed   SLS 3 reps   Wall Bumps  Shoulder;Hip  10 reps each   Balance Beam forward 2RT   Tandem Gait Forward;2 reps   Retro Gait 2 reps   Sidestepping 2 reps;Theraband  green 1RT, red 1RT   Marching Limitations 20  2 sets one with high knees and B EU, 1 without UE   Toe Raise Limitations 10   Sit to Stand Time 10             PT Short Term Goals - 11/09/16 1517      PT SHORT TERM GOAL #1   Title Pt to be able to single leg stance for at least 10 seconds on both legs to reduce risk of falling    Time 2   Period Weeks   Status On-going     PT SHORT TERM GOAL #2   Title Pt to state that she is completing light housework activites    Time 2   Period Weeks   Status On-going     PT SHORT TERM GOAL #3   Title Pt to be I in all bed mobility aspects.    Time 2   Period Weeks   Status On-going           PT Long Term Goals - 11/09/16  1517      PT LONG TERM GOAL #1   Title Pt  leg strength to be increased one grade to allow pt to go up and down 5 steps with no difficulty   Time 4   Period Weeks   Status On-going     PT LONG TERM GOAL #2   Title Berg balance test to be at least 54 to allow pt to feel confident walking on uneven terrain.    Time 4   Period Weeks   Status On-going     PT LONG TERM GOAL #3   Title Pt to be able to walk for over 40 minutes to be able to complete the grocery shopping    Time 4   Period Weeks   Status On-going     PT LONG TERM GOAL #4   Title Pt to be confident vacuuming .    Time 4   Period Weeks   Status On-going               Plan - 11/30/16 1658    Clinical Impression Statement PT overall doing well without reports of falls on previous trip to [place].  Pt with less impulsivities this session as compared to last and able to complete full session without rest break.  Added hip and shoulder bumps with noted difficulty due to weak core.  PT also required max cues to take larger steps with balance activities and to correct LOB.  Pt with tendency to go into  extension posturing with toeraises and requires assist to re-establish balance.  Attempted progression to green tband with sidestepping, however too difficult and decreased back to red.     Rehab Potential Good   PT Frequency 2x / week   PT Duration 4 weeks   PT Treatment/Interventions ADLs/Self Care Home Management;Patient/family education;Therapeutic exercise;Balance training;Neuromuscular re-education;Therapeutic activities;Gait training;Stair training   PT Next Visit Plan Continue to improve safety awarness.  Improve stability with increasing challenge.    Consulted and Agree with Plan of Care Patient      Patient will benefit from skilled therapeutic intervention in order to improve the following deficits and impairments:  Decreased activity tolerance, Decreased balance, Decreased mobility, Difficulty walking, Decreased strength, Pain, Improper body mechanics, Postural dysfunction  Visit Diagnosis: Unsteadiness on feet  Muscle weakness (generalized)  Repeated falls  Other lack of coordination  Other symptoms and signs involving the musculoskeletal system     Problem List Patient Active Problem List   Diagnosis Date Noted  . Dysmetria   . Diabetes mellitus type 2 in nonobese (HCC)   . Supplemental oxygen dependent   . Benign essential HTN   . Hypokalemia   . Hypoalbuminemia due to protein-calorie malnutrition ([place])   . Embolic stroke of right basal ganglia ([place]) 10/12/2016  . Oropharyngeal dysphagia   . Stroke ([place]) 10/09/2016  . Stroke (cerebrum) ([place]) 10/09/2016  . 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 Frequent falls 04/21/2016  . 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 Type 2 diabetes mellitus with peripheral neuropathy (HCC)   . Fibromyalgia   . Chronic pain syndrome   . Chronic obstructive pulmonary disease ([place])   . Gait disturbance, post-stroke   . Dysarthria, post-stroke   . Dysphagia, post-stroke   .  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 Mixed hyperlipidemia   . 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  . Allergic rhinitis 12/17/2015  .  Primary osteoarthritis of both knees 11/19/2015  . Obesity (BMI 30-39.9) 09/04/2015  . Essential hypertension 09/04/2015  . Constipation 09/04/2015  . Asthma 08/26/2015  . Raynaud's phenomenon 08/26/2015  . Gastro-esophageal reflux disease with esophagitis 08/26/2015  . Spinal stenosis 08/26/2015  . Vitamin D deficiency 08/26/2015  . Diabetes mellitus, type 2 ([place]) 08/22/2015  . HNP (herniated nucleus pulposus), cervical 07/10/2015  . Cervical radicular pain 04/30/2013  . Knee osteoarthritis 01/05/2012   Teena Irani, PTA/CLT [phone]  Teena Irani 11/30/2016, 5:06 PM  [facility] [address] [place], [place], [address] Phone: [phone]   Fax:  [phone]  Name: Susan Patel MRN: [id number] Date of Birth: 03-24-1941

## 2016-12-02 ENCOUNTER — Ambulatory Visit (HOSPITAL_COMMUNITY): Payer: Medicare Other

## 2016-12-02 DIAGNOSIS — R2681 Unsteadiness on feet: Secondary | ICD-10-CM

## 2016-12-02 DIAGNOSIS — R278 Other lack of coordination: Secondary | ICD-10-CM

## 2016-12-02 DIAGNOSIS — M6281 Muscle weakness (generalized): Secondary | ICD-10-CM

## 2016-12-02 DIAGNOSIS — R296 Repeated falls: Secondary | ICD-10-CM

## 2016-12-02 NOTE — Therapy (Signed)
[place] [facility] [address] [place], [place], [address] Phone: [phone]   Fax:  [phone]  Physical Therapy Treatment  Patient Details  Name: Susan Patel MRN: [id number] Date of Birth: June 12, 1940 Referring Provider: Lauraine Rinne  Encounter Date: 12/02/2016      PT End of Session - 12/02/16 1620    Visit Number 6   Number of Visits 8   Date for PT Re-Evaluation 11/24/16   Authorization Type [employer]   Authorization - Visit Number 6   Authorization - Number of Visits 8   PT Start Time 1622  Pt late for apt   PT Stop Time 1648   PT Time Calculation (min) 26 min   Equipment Utilized During Treatment Gait belt   Activity Tolerance Patient tolerated treatment well   Behavior During Therapy [facility] for tasks assessed/performed      Past Medical History:  Diagnosis Date  . Arthritis    'all over"  . Asthma   . Chronic pain   . COPD (chronic obstructive pulmonary disease) (HCC)    O2 per [place] at nights   . Depression   . Diabetes mellitus without complication ([place])    borderline- , states she was on med., but MD told her "everything is under control so I threw the bottle away"  . Fibromyalgia   . GERD (gastroesophageal reflux disease)   . Hypertension   . Neuromuscular disorder (HCC)    parkinson, neuropathy- both feet & hands.  . Stroke [facility])    residual dysarthria    Past Surgical History:  Procedure Laterality Date  . ABDOMINAL HYSTERECTOMY    . ANTERIOR CERVICAL DECOMP/DISCECTOMY FUSION N/A 07/10/2015   Procedure: Cervical five-six, Cervical six-seven anterior cervical decompression with fusion plating and bonegraft;  Surgeon: Jovita Gamma, MD;  Location: [place] [facility];  Service: Neurosurgery;  Laterality: N/A;  . APPENDECTOMY    . BIOPSY EYE MUSCLE  03/20/2016   biopsy vessel to right eye due to swelling  . EYE SURGERY Bilateral    cataracts removed, /w "cyrstal lenses"   . SHOULDER ARTHROSCOPY Right    x2   RCR- spurs removed     There were no vitals filed for this visit.      Subjective Assessment - 12/02/16 1624    Subjective Pt accompanied by sponse.  Pt stated she is tired today, has been working around the house earlier.  Reports she feels her balance is improving, feels speech most difficult currently   Pertinent History Fibromyalgia, Lt CVA, DM, HTN , parkinson   Patient Stated Goals To wash clothes, cook and do her housework    Currently in Pain? No/denies                              Balance Exercises - 12/02/16 1649      Balance Exercises: Standing   Tandem Stance Eyes open;Foam/compliant surface;3 reps;30 secs   SLS 3 reps   Wall Bumps Shoulder;Hip  10 reps each   Balance Beam forward 2RT   Retro Gait 2 reps   Sidestepping 2 reps;Theraband  RTB   Marching Limitations --  10x each LE no HHA   Toe Raise Limitations 15   Sit to Stand Time 15             PT Short Term Goals - 11/09/16 1517      PT SHORT TERM GOAL #1   Title  Pt to be able to single leg stance for at least 10 seconds on both legs to reduce risk of falling    Time 2   Period Weeks   Status On-going     PT SHORT TERM GOAL #2   Title Pt to state that she is completing light housework activites    Time 2   Period Weeks   Status On-going     PT SHORT TERM GOAL #3   Title Pt to be I in all bed mobility aspects.    Time 2   Period Weeks   Status On-going           PT Long Term Goals - 11/09/16 1517      PT LONG TERM GOAL #1   Title Pt  leg strength to be increased one grade to allow pt to go up and down 5 steps with no difficulty   Time 4   Period Weeks   Status On-going     PT LONG TERM GOAL #2   Title Berg balance test to be at least 54 to allow pt to feel confident walking on uneven terrain.    Time 4   Period Weeks   Status On-going     PT LONG TERM GOAL #3   Title Pt to be able to walk for over 40 minutes to be able to complete the grocery  shopping    Time 4   Period Weeks   Status On-going     PT LONG TERM GOAL #4   Title Pt to be confident vacuuming .    Time 4   Period Weeks   Status On-going               Plan - 12/02/16 1844    Clinical Impression Statement Pt late for apt.  Stated she was tired from housework earlier today.  Reports she feels balance is improving.  Continued session wiht balance training wiht min A and cueing for posture to assist with balance activities.  Reviewed importance of safety transitions to reduce risk of falls to improve awareness of surrounding.     Rehab Potential Good   PT Frequency 2x / week   PT Duration 4 weeks   PT Treatment/Interventions ADLs/Self Care Home Management;Patient/family education;Therapeutic exercise;Balance training;Neuromuscular re-education;Therapeutic activities;Gait training;Stair training   PT Next Visit Plan Reassess next session.  Continue to improve safety awarness.  Improve stability with increasing challenge. Add vector stance with HHA for stability and balance training.        Patient will benefit from skilled therapeutic intervention in order to improve the following deficits and impairments:  Decreased activity tolerance, Decreased balance, Decreased mobility, Difficulty walking, Decreased strength, Pain, Improper body mechanics, Postural dysfunction  Visit Diagnosis: Unsteadiness on feet  Muscle weakness (generalized)  Repeated falls  Other lack of coordination     Problem List Patient Active Problem List   Diagnosis Date Noted  . Dysmetria   . Diabetes mellitus type 2 in nonobese (HCC)   . Supplemental oxygen dependent   . Benign essential HTN   . Hypokalemia   . Hypoalbuminemia due to protein-calorie malnutrition ([place])   . Embolic stroke of right basal ganglia ([place]) 10/12/2016  . Oropharyngeal dysphagia   . Stroke ([place]) 10/09/2016  . Stroke (cerebrum) ([place]) 10/09/2016  . 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  Frequent falls 04/21/2016  . 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 Type 2  diabetes mellitus with peripheral neuropathy ([place])   . Fibromyalgia   . Chronic pain syndrome   . Chronic obstructive pulmonary disease ([place])   . Gait disturbance, post-stroke   . Dysarthria, post-stroke   . Dysphagia, post-stroke   . 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 Mixed hyperlipidemia   . 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  . Allergic rhinitis 12/17/2015  . Primary osteoarthritis of both knees 11/19/2015  . Obesity (BMI 30-39.9) 09/04/2015  . Essential hypertension 09/04/2015  . Constipation 09/04/2015  . Asthma 08/26/2015  . Raynaud's phenomenon 08/26/2015  . Gastro-esophageal reflux disease with esophagitis 08/26/2015  . Spinal stenosis 08/26/2015  . Vitamin D deficiency 08/26/2015  . Diabetes mellitus, type 2 ([place]) 08/22/2015  . HNP (herniated nucleus pulposus), cervical 07/10/2015  . Cervical radicular pain 04/30/2013  . Knee osteoarthritis 01/05/2012   Ihor Austin, LPTA; CBIS [phone]  Aldona Lento 12/02/2016, [place] [address] [place], [place], [address] Phone: [phone]   Fax:  [phone]  Name: Susan Patel MRN: [id number] Date of Birth: 1940/10/27

## 2016-12-06 ENCOUNTER — Ambulatory Visit (HOSPITAL_COMMUNITY): Payer: Medicare Other | Admitting: Physical Therapy

## 2016-12-06 DIAGNOSIS — M6281 Muscle weakness (generalized): Secondary | ICD-10-CM

## 2016-12-06 DIAGNOSIS — R296 Repeated falls: Secondary | ICD-10-CM

## 2016-12-06 DIAGNOSIS — R2681 Unsteadiness on feet: Secondary | ICD-10-CM

## 2016-12-06 NOTE — Therapy (Signed)
[place], [place], [address] Phone: [phone]   Fax:  [phone]  Physical Therapy Treatment  Patient Details  Name: Susan Patel MRN: [id number] Date of Birth: [birth year] Referring Provider: Jethro Bolus  Encounter Date: 12/06/2016      PT End of Session - 12/06/16 1601    Visit Number 7   Number of Visits 8   Date for PT Re-Evaluation 11/24/16   Authorization Type [employer]   Authorization - Visit Number 7   Authorization - Number of Visits 8   PT Start Time 1300   PT Stop Time 1345   PT Time Calculation (min) 45 min   Equipment Utilized During Treatment Gait belt   Activity Tolerance Patient tolerated treatment well   Behavior During Therapy [facility] for tasks assessed/performed      Past Medical History:  Diagnosis Date  . Arthritis    'all over"  . Asthma   . Chronic pain   . COPD (chronic obstructive pulmonary disease) (HCC)    O2 per [place] at nights   . Depression   . Diabetes mellitus without complication ([place])    borderline- , states she was on med., but MD told her "everything is under control so I threw the bottle away"  . Fibromyalgia   . GERD (gastroesophageal reflux disease)   . Hypertension   . Neuromuscular disorder (HCC)    parkinson, neuropathy- both feet & hands.  . 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 ABDOMINAL HYSTERECTOMY    . ANTERIOR CERVICAL DECOMP/DISCECTOMY FUSION N/A 07/10/2015   Procedure: Cervical five-six, Cervical six-seven anterior cervical decompression with fusion plating and bonegraft;  Surgeon: Jovita Gamma, MD;  Location: [place] [facility];  Service: Neurosurgery;  Laterality: N/A;  . APPENDECTOMY    . BIOPSY EYE MUSCLE  03/20/2016   biopsy vessel to right eye due to swelling  . EYE SURGERY Bilateral    cataracts removed, /w "cyrstal lenses"   . SHOULDER ARTHROSCOPY Right    x2   RCR- spurs  removed     There were no vitals filed for this visit.      Subjective Assessment - 12/06/16 1357    Subjective Pt is doing her laundry and light housework and cooking  now.     Pertinent History Fibromyalgia, Lt CVA, DM, HTN , parkinson   How long can you sit comfortably? no problem   How long can you stand comfortably? able to stand 15 minutes    How long can you walk comfortably? 20 minutes    Patient Stated Goals To wash clothes, cook and do her housework    Currently in Pain? No/denies            [facility] PT Assessment - 12/06/16 0001      Assessment   Medical Diagnosis Rt CVA; Basal Ganglia and occipital    Referring Provider Jethro Bolus   Onset Date/Surgical Date 10/06/16   Next MD Visit not scheduled    Prior Therapy acute and IP     Precautions   Precautions None     Restrictions   Weight Bearing Restrictions No     Home Environment   Living Environment Private residence     Prior Function   Level of Independence Independent     Cognition   Overall Cognitive Status Within Functional Limits for tasks assessed     Observation/Other  Assessments   Focus on Therapeutic Outcomes (FOTO)  67 was 59     Single Leg Stance   Comments Rt 14 was 6 seconds, Lt 41 was 3  seconds      Sit to Stand   Comments 5x 14.23 was  16.03      Strength   Right Hip Flexion 5/5   Right Hip Extension 3/5   Right Hip ABduction 5/5  was 3+/5    Left Hip Flexion 5/5  was 4-/5    Left Hip Extension 3/5  was 2-/5   Left Hip ABduction 5/5  was 4/5    Right Knee Flexion 5/5  was 3+/5   Right Knee Extension 5/5   Left Knee Flexion 5/5  was 3+/5    Left Knee Extension 5/5   Right Ankle Dorsiflexion 5/5   Left Ankle Dorsiflexion 5/5     Standardized Balance Assessment   Standardized Balance Assessment Berg Balance Test     Berg Balance Test   Sit to Stand Able to stand without using hands and stabilize independently   Standing Unsupported Able to stand safely 2  minutes   Sitting with Back Unsupported but Feet Supported on Floor or Stool Able to sit safely and securely 2 minutes   Stand to Sit Sits safely with minimal use of hands   Transfers Able to transfer safely, minor use of hands   Standing Unsupported with Eyes Closed Able to stand 10 seconds safely   Standing Ubsupported with Feet Together Able to place feet together independently and stand 1 minute safely   From Standing, Reach Forward with Outstretched Arm Can reach confidently >25 cm (10")   From Standing Position, Pick up Object from Floor Able to pick up shoe safely and easily   From Standing Position, Turn to Look Behind Over each Shoulder Looks behind from both sides and weight shifts well   Turn 360 Degrees Able to turn 360 degrees safely one side only in 4 seconds or less   Standing Unsupported, Alternately Place Feet on Step/Stool Able to stand independently and safely and complete 8 steps in 20 seconds   Standing Unsupported, One Foot in Front Able to plae foot ahead of the other independently and hold 30 seconds   Standing on One Leg Able to lift leg independently and hold > 10 seconds   Total Score 54                     [facility] Adult PT Treatment/Exercise - 12/06/16 0001      Bed Mobility   Bed Mobility --   Rolling Right --   Rolling Left --   Supine to Sit --   Sit to Supine --     Knee/Hip Exercises: Standing   Heel Raises Both;10 reps   SLS x5   Other Standing Knee Exercises forearm on counter extend leg x 10      Knee/Hip Exercises: Seated   Sit to Sand 10 reps     Knee/Hip Exercises: Supine   Bridges 10 reps     Knee/Hip Exercises: Prone   Hip Extension 10 reps                  PT Short Term Goals - 12/06/16 1415      PT SHORT TERM GOAL #1   Title Pt to be able to single leg stance for at least 10 seconds on both legs to reduce risk of falling  Time 2   Period Weeks   Status Achieved     PT SHORT TERM GOAL #2   Title Pt to  state that she is completing light housework activites    Time 2   Period Weeks   Status Achieved     PT SHORT TERM GOAL #3   Title Pt to be I in all bed mobility aspects.    Time 2   Period Weeks   Status Achieved           PT Long Term Goals - 12/06/16 1415      PT LONG TERM GOAL #1   Title Pt  leg strength to be increased one grade to allow pt to go up and down 5 steps with no difficulty   Time 4   Period Weeks   Status Achieved     PT LONG TERM GOAL #2   Title Berg balance test to be at least 54 to allow pt to feel confident walking on uneven terrain.    Time 4   Period Weeks   Status Achieved     PT LONG TERM GOAL #3   Title Pt to be able to walk for over 40 minutes to be able to complete the grocery shopping    Time 4   Period Weeks   Status Achieved     PT LONG TERM GOAL #4   Title Pt to be confident vacuuming .    Time 4   Period Weeks   Status Deferred  Pt has central vacumn has someone that comes in and does it for her.               Plan - 12/06/16 1602    Clinical Impression Statement Pt reassessed with most goals being met.  Pt feels that she can continue with a HEP but there was no time to go over a new HEP with patient.  Pt agreeable to coming for one more treatment to go over new HEP.  Pt only limitation at this time is high level balance and bilateral gluteus maximus strength.    Rehab Potential Good   PT Frequency 2x / week   PT Duration 4 weeks   PT Treatment/Interventions ADLs/Self Care Home Management;Patient/family education;Therapeutic exercise;Balance training;Neuromuscular re-education;Therapeutic activities;Gait training;Stair training   PT Next Visit Plan Give pt a new HEP and explain that she does not have to complete any of the other exercises given to her.  HEP to include bridges, prone hip extension, slow sit to stand, and vector stances       Patient will benefit from skilled therapeutic intervention in order to improve the  following deficits and impairments:  Decreased activity tolerance, Decreased balance, Decreased mobility, Difficulty walking, Decreased strength, Pain, Improper body mechanics, Postural dysfunction  Visit Diagnosis: Unsteadiness on feet  Muscle weakness (generalized)  Repeated falls     Problem List Patient Active Problem List   Diagnosis Date Noted  . Dysmetria   . Diabetes mellitus type 2 in nonobese (HCC)   . Supplemental oxygen dependent   . Benign essential HTN   . Hypokalemia   . Hypoalbuminemia due to protein-calorie malnutrition ([place])   . Embolic stroke of right basal ganglia ([place]) 10/12/2016  . Oropharyngeal dysphagia   . Stroke ([place]) 10/09/2016  . Stroke (cerebrum) ([place]) 10/09/2016  . 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 Frequent falls 04/21/2016  . 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  Type 2 diabetes mellitus with peripheral neuropathy (HCC)   . Fibromyalgia   . Chronic pain syndrome   . Chronic obstructive pulmonary disease ([place])   . Gait disturbance, post-stroke   . Dysarthria, post-stroke   . Dysphagia, post-stroke   . 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 Mixed hyperlipidemia   . 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  . Allergic rhinitis 12/17/2015  . Primary osteoarthritis of both knees 11/19/2015  . Obesity (BMI 30-39.9) 09/04/2015  . Essential hypertension 09/04/2015  . Constipation 09/04/2015  . Asthma 08/26/2015  . Raynaud's phenomenon 08/26/2015  . Gastro-esophageal reflux disease with esophagitis 08/26/2015  . Spinal stenosis 08/26/2015  . Vitamin D deficiency 08/26/2015  . Diabetes mellitus, type 2 ([place]) 08/22/2015  . HNP (herniated nucleus pulposus), cervical 07/10/2015  . Cervical radicular pain 04/30/2013  . Knee osteoarthritis 01/05/2012    Rayetta Humphrey, PT CLT [phone] 12/06/2016, 4:08 PM  [place] [address] [place], [place], [address] Phone: [phone]   Fax:  [phone]  Name: Susan Patel MRN: [id number] Date of Birth: [birth year]

## 2016-12-09 ENCOUNTER — Ambulatory Visit (HOSPITAL_COMMUNITY): Payer: Medicare Other

## 2016-12-09 ENCOUNTER — Telehealth (HOSPITAL_COMMUNITY): Payer: Self-pay

## 2016-12-09 NOTE — Telephone Encounter (Signed)
She is sick today and can not come

## 2016-12-13 ENCOUNTER — Ambulatory Visit (HOSPITAL_COMMUNITY): Payer: Medicare Other | Admitting: Physical Therapy

## 2016-12-15 ENCOUNTER — Encounter (HOSPITAL_COMMUNITY): Payer: Self-pay | Admitting: Speech Pathology

## 2016-12-15 ENCOUNTER — Ambulatory Visit (HOSPITAL_COMMUNITY): Payer: Medicare Other | Attending: Physical Medicine and Rehabilitation | Admitting: Speech Pathology

## 2016-12-15 DIAGNOSIS — I69822 Dysarthria following other cerebrovascular disease: Secondary | ICD-10-CM | POA: Diagnosis not present

## 2016-12-15 DIAGNOSIS — R1312 Dysphagia, oropharyngeal phase: Secondary | ICD-10-CM | POA: Insufficient documentation

## 2016-12-15 DIAGNOSIS — R41841 Cognitive communication deficit: Secondary | ICD-10-CM | POA: Diagnosis present

## 2016-12-15 NOTE — Therapy (Signed)
[place], [place], [address] Phone: [phone]   Fax:  [phone]  Speech Language Pathology Treatment  Patient Details  Name: Susan Patel MRN: [id number] Date of Birth: 05-17-1941 Referring Provider: Lauraine Rinne  Encounter Date: 12/15/2016      End of Session - 12/15/16 1448    Visit Number 4   Number of Visits 16   Date for SLP Re-Evaluation 12/23/16   Authorization Type [employer] Medicare   SLP Start Time 1352   SLP Stop Time  1439   SLP Time Calculation (min) 47 min   Activity Tolerance Patient tolerated treatment well      Past Medical History:  Diagnosis Date  . Arthritis    'all over"  . Asthma   . Chronic pain   . COPD (chronic obstructive pulmonary disease) (HCC)    O2 per [place] at nights   . Depression   . Diabetes mellitus without complication ([place])    borderline- , states she was on med., but MD told her "everything is under control so I threw the bottle away"  . Fibromyalgia   . GERD (gastroesophageal reflux disease)   . Hypertension   . Neuromuscular disorder (HCC)    parkinson, neuropathy- both feet & hands.  . Stroke [facility])    residual dysarthria    Past Surgical History:  Procedure Laterality Date  . ABDOMINAL HYSTERECTOMY    . ANTERIOR CERVICAL DECOMP/DISCECTOMY FUSION N/A 07/10/2015   Procedure: Cervical five-six, Cervical six-seven anterior cervical decompression with fusion plating and bonegraft;  Surgeon: Jovita Gamma, MD;  Location: [place] [facility];  Service: Neurosurgery;  Laterality: N/A;  . APPENDECTOMY    . BIOPSY EYE MUSCLE  03/20/2016   biopsy vessel to right eye due to swelling  . EYE SURGERY Bilateral    cataracts removed, /w "cyrstal lenses"   . SHOULDER ARTHROSCOPY Right    x2   RCR- spurs removed     There were no vitals filed for this visit.      Subjective Assessment - 12/15/16 1446    Subjective "I am doing great!"   Patient is accompained by: Family  member   Currently in Pain? No/denies           ADULT SLP TREATMENT - 12/15/16 0001      General Information   Behavior/Cognition Alert   Patient Positioning Upright in chair   Oral care provided N/A   HPI Susan Patel is a 76 y.o. female COPD- oxygen dependent, T2DM, Neuropathy, chronic pain, CVA with residual visual deficits?, Parkinsonism; who was admitted on 10/09/16 with inability to speak, left sided weakness and difficulty walking. CT head done revealing suspicion of acute right basal ganglia infarct, subacute right occipital infarct, left basal infarct question age and extensive small vessel disease. She receive tPA and continues on ASA for secondary stroke prevention.  MRI brain done revealing acute infarcts right basal ganglia anterior right parietal white matter with chronic right occipital cortical infarct. CTA head/neck showed severe progressive advanced intracranial atherosclerosis.  2 D echo revealed EF 60-65%, with grade one diastolic dysfunction. Most recent MBSS was completed 10/19/2016 and she was started on dysphagia 1, honey liquids. ASA changed to plavix due to progressive intracranial atherosclerosis  and stroke felt to be thrombotic due to small vessel disease. She is referred for outpatient SLP to address cognitive communication deficits and dysphagia.      Treatment Provided   Treatment provided Cognitive-Linquistic  Cognitive-Linquistic Treatment   Treatment focused on Dysarthria;Patient/family/caregiver education   Skilled Treatment audio recording with pt self evaluation, emphasis on increasing vocal intensity, breath support, and adding pauses in speech, divergent naming, responding to open ended questions     Assessment / Recommendations / [place] with current plan of care          SLP Education - 12/15/16 1450    Education provided Yes   Education Details Pt observed with thin liquids and no signs/symptoms of aspiration; SLP offered MBSS  due to previous dysphagia but both Pt/spouse decline   Person(s) Educated Patient;Spouse   Methods Explanation   Comprehension Verbalized understanding          SLP Short Term Goals - 12/15/16 1449      SLP SHORT TERM GOAL #1   Title Pt. will participate in clinical swallow evaluation next session and likely MBSS in 1-2 weeks   Baseline last MBS 10/19/2016 with rec for puree and HTL   Time 2   Period Weeks   Status Partially Met     SLP SHORT TERM GOAL #2   Title Pt will accurately produce requested sounds in 2-3 syllable words (alveolars) with 80% acc with reps of 5 each with mod visual cue from SLP.   Baseline 50% accuracy    Time 8   Period Weeks   Status On-going     SLP SHORT TERM GOAL #3   Title Pt. will complete oral motor exercises, pharyngeal strengthening, and lingual ROM exercises with accuracy in 8/10 trials given minimal feedback.    Baseline 50% accuracy    Time 8   Period Weeks   Status On-going     SLP SHORT TERM GOAL #4   Title Pt. will verbalize response to hypothetical problem solving situations with 90% acc given current safety precautions with min assistance.    Baseline 70% accuracy    Time 8   Period Weeks   Status Not Met          SLP Long Term Goals - 10/26/16 1809      SLP LONG TERM GOAL #1   Title Pt. will increase speech intelligibility to 85% during conversational exchanges with no assistance.    Baseline 60% accuracy    Time 8   Period Weeks   Status Achieved     SLP LONG TERM GOAL #2   Title Pt. will complete expressive language tasks with 85% accuracy and no assistance.    Baseline 20% accuracy    Time 8   Period Weeks   Status Achieved     SLP LONG TERM GOAL #3   Title Pt. will safelty swallow least restrictive diet with no s/s of aspiration in 9/10 trials and no cues.    Baseline 50% accuracy    Time 8   Period Weeks   Status Achieved          Plan - 12/15/16 1449    Clinical Impression Statement Pt accompanied to  therapy by her husband. She reports a successful trip to [place] recently. SLP inquired about swallowing again and Pt/spouse report that she is doing well consuming regular textures and all liquids. Both endorse occasional coughing if "she goes to fast". Pt observed with water and crackers this session and Pt without overt s/sx of aspiration. Pt noted to have improved speech intelligibility this session. SLP provided initial cues for breath support and visual reminder (SLP sitting further away) to increase vocal intensity.  Pt successful in implementing speech intelligibility strategies in short conversation for 90% intelligibility. Discussed possible D/C from SLP services given improvement over next few session. Continue POC.    Speech Therapy Frequency 2x / week   Duration --  8 weeks   Treatment/Interventions Aspiration precaution training;Diet toleration management by SLP;SLP instruction and feedback;Compensatory strategies;Patient/family education;Oral motor exercises;Trials of upgraded texture/liquids   Potential to Achieve Goals Good   Potential Considerations Previous level of function   SLP Home Exercise Plan Pt will complete HEP as assigned to facilitate carryover of treatment strategies and techniques in home environment with assist from spouse.   Consulted and Agree with Plan of Care Patient   Family Member Consulted patient's husband       Patient will benefit from skilled therapeutic intervention in order to improve the following deficits and impairments:   Dysarthria following other cerebrovascular disease  Cognitive communication deficit    Problem List Patient Active Problem List   Diagnosis Date Noted  . Dysmetria   . Diabetes mellitus type 2 in nonobese (HCC)   . Supplemental oxygen dependent   . Benign essential HTN   . Hypokalemia   . Hypoalbuminemia due to protein-calorie malnutrition ([place])   . Embolic stroke of right basal ganglia ([place]) 10/12/2016  . Oropharyngeal  dysphagia   . Stroke ([place]) 10/09/2016  . Stroke (cerebrum) ([place]) 10/09/2016  . 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 Frequent falls 04/21/2016  . 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 Type 2 diabetes mellitus with peripheral neuropathy (HCC)   . Fibromyalgia   . Chronic pain syndrome   . Chronic obstructive pulmonary disease ([place])   . Gait disturbance, post-stroke   . Dysarthria, post-stroke   . Dysphagia, post-stroke   . 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 Mixed hyperlipidemia   . 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  . Allergic rhinitis 12/17/2015  . Primary osteoarthritis of both knees 11/19/2015  . Obesity (BMI 30-39.9) 09/04/2015  . Essential hypertension 09/04/2015  . Constipation 09/04/2015  . Asthma 08/26/2015  . Raynaud's phenomenon 08/26/2015  . Gastro-esophageal reflux disease with esophagitis 08/26/2015  . Spinal stenosis 08/26/2015  . Vitamin D deficiency 08/26/2015  . Diabetes mellitus, type 2 ([place]) 08/22/2015  . HNP (herniated nucleus pulposus), cervical 07/10/2015  . Cervical radicular pain 04/30/2013  . Knee osteoarthritis 01/05/2012   Thank you,  Genene Churn, [place]  [facility] 12/15/2016, 2:52 PM  [place] [address] [place], [place], [address] Phone: [phone]   Fax:  [phone]   Name: Susan Patel MRN: [id number] Date of Birth: 1940-05-25

## 2016-12-16 ENCOUNTER — Ambulatory Visit (HOSPITAL_COMMUNITY): Payer: Medicare Other

## 2016-12-20 ENCOUNTER — Encounter (HOSPITAL_COMMUNITY): Payer: Self-pay | Admitting: Speech Pathology

## 2016-12-20 ENCOUNTER — Ambulatory Visit (HOSPITAL_COMMUNITY): Payer: Medicare Other | Admitting: Speech Pathology

## 2016-12-20 DIAGNOSIS — I69822 Dysarthria following other cerebrovascular disease: Secondary | ICD-10-CM | POA: Diagnosis not present

## 2016-12-20 DIAGNOSIS — R41841 Cognitive communication deficit: Secondary | ICD-10-CM

## 2016-12-20 DIAGNOSIS — R1312 Dysphagia, oropharyngeal phase: Secondary | ICD-10-CM

## 2016-12-20 NOTE — Therapy (Signed)
[place], [place], [address] Phone: [phone]   Fax:  [phone]  Speech Language Pathology Treatment  Patient Details  Name: Susan Patel MRN: [id number] Date of Birth: 21-Nov-1940 Referring Provider: Lauraine Rinne  Encounter Date: 12/20/2016      End of Session - 12/20/16 1435    Visit Number 5   Number of Visits 16   Date for SLP Re-Evaluation 12/23/16   Authorization Type [employer]HC Medicare   SLP Start Time 1345   SLP Stop Time  1430   SLP Time Calculation (min) 45 min   Activity Tolerance Patient tolerated treatment well      Past Medical History:  Diagnosis Date  . Arthritis    'all over"  . Asthma   . Chronic pain   . COPD (chronic obstructive pulmonary disease) (HCC)    O2 per [place] at nights   . Depression   . Diabetes mellitus without complication ([place])    borderline- , states she was on med., but MD told her "everything is under control so I threw the bottle away"  . Fibromyalgia   . GERD (gastroesophageal reflux disease)   . Hypertension   . Neuromuscular disorder (HCC)    parkinson, neuropathy- both feet & hands.  . Stroke [facility])    residual dysarthria    Past Surgical History:  Procedure Laterality Date  . ABDOMINAL HYSTERECTOMY    . ANTERIOR CERVICAL DECOMP/DISCECTOMY FUSION N/A 07/10/2015   Procedure: Cervical five-six, Cervical six-seven anterior cervical decompression with fusion plating and bonegraft;  Surgeon: Jovita Gamma, MD;  Location: [place] [facility];  Service: Neurosurgery;  Laterality: N/A;  . APPENDECTOMY    . BIOPSY EYE MUSCLE  03/20/2016   biopsy vessel to right eye due to swelling  . EYE SURGERY Bilateral    cataracts removed, /w "cyrstal lenses"   . SHOULDER ARTHROSCOPY Right    x2   RCR- spurs removed     There were no vitals filed for this visit.      Subjective Assessment - 12/20/16 1434    Subjective "I can't sing anymore."   Currently in Pain? No/denies           ADULT SLP TREATMENT - 12/20/16 0001      General Information   Behavior/Cognition Alert;Cooperative;Pleasant mood   Patient Positioning Upright in chair   Oral care provided N/A   HPI Susan Patel is a 76 y.o. female COPD- oxygen dependent, T2DM, Neuropathy, chronic pain, CVA with residual visual deficits?, Parkinsonism; who was admitted on 10/09/16 with inability to speak, left sided weakness and difficulty walking. CT head done revealing suspicion of acute right basal ganglia infarct, subacute right occipital infarct, left basal infarct question age and extensive small vessel disease. She receive tPA and continues on ASA for secondary stroke prevention.  MRI brain done revealing acute infarcts right basal ganglia anterior right parietal white matter with chronic right occipital cortical infarct. CTA head/neck showed severe progressive advanced intracranial atherosclerosis.  2 D echo revealed EF 60-65%, with grade one diastolic dysfunction. Most recent MBSS was completed 10/19/2016 and she was started on dysphagia 1, honey liquids. ASA changed to plavix due to progressive intracranial atherosclerosis  and stroke felt to be thrombotic due to small vessel disease. She is referred for outpatient SLP to address cognitive communication deficits and dysphagia.      Treatment Provided   Treatment provided Cognitive-Linquistic     Cognitive-Linquistic Treatment   Treatment focused  on Dysarthria;Patient/family/caregiver education   Skilled Treatment audio recording with pt self evaluation, emphasis on increasing vocal intensity, breath support, and adding pauses in speech, divergent naming, responding to open ended questions     Assessment / Recommendations / [place] with current plan of care            SLP Short Term Goals - 12/20/16 1436      SLP SHORT TERM GOAL #1   Title Pt. will participate in clinical swallow evaluation next session and likely MBSS in 1-2 weeks    Baseline last MBS 10/19/2016 with rec for puree and HTL   Time 2   Period Weeks   Status Partially Met     SLP SHORT TERM GOAL #2   Title Pt will accurately produce requested sounds in 2-3 syllable words (alveolars) with 80% acc with reps of 5 each with mod visual cue from SLP.   Baseline 50% accuracy    Time 8   Period Weeks   Status On-going     SLP SHORT TERM GOAL #3   Title Pt. will complete oral motor exercises, pharyngeal strengthening, and lingual ROM exercises with accuracy in 8/10 trials given minimal feedback.    Baseline 50% accuracy    Time 8   Period Weeks   Status On-going     SLP SHORT TERM GOAL #4   Title Pt. will verbalize response to hypothetical problem solving situations with 90% acc given current safety precautions with min assistance.    Baseline 70% accuracy    Time 8   Period Weeks   Status Not Met          SLP Long Term Goals - 10/26/16 1809      SLP LONG TERM GOAL #1   Title Pt. will increase speech intelligibility to 85% during conversational exchanges with no assistance.    Baseline 60% accuracy    Time 8   Period Weeks   Status Achieved     SLP LONG TERM GOAL #2   Title Pt. will complete expressive language tasks with 85% accuracy and no assistance.    Baseline 20% accuracy    Time 8   Period Weeks   Status Achieved     SLP LONG TERM GOAL #3   Title Pt. will safelty swallow least restrictive diet with no s/s of aspiration in 9/10 trials and no cues.    Baseline 50% accuracy    Time 8   Period Weeks   Status Achieved          Plan - 12/20/16 1435    Clinical Impression Statement Pt with decreased speech intelligibility today and increased hoarse vocal quality. Pt's speech intelligibility does appear to fluctuate, however does not appear to correlate with any specific factors (fatigue, time of day, medication, etc). Pt also with seemingly reduced awareness regarding fluctuation. She was also observed to cough after drinking thin  liquids by cup today and she immediately stated, "I need to slow down". She and her husband decline further word up via MBSS. Pt required moderate cues for intro go vocal function exercises. She has difficulty with pitch changes and voice generally monotone. SLP spoke with husband regarding whether her voice changes started following her ACDF repair before both of her strokes. He reports that she had some swallowing difficulties following that surgery, but was unaware of voice changes. Both Pt and spouse report that Pt previously enjoyed singing in the choir, but she no longer is  able following her strokes. SLP suggested ENT consult to visualize movement of vocal folds as it is possible she is not getting adequate closure resulting in aspiration. They stated they will think about it. Continue POC.    Speech Therapy Frequency 2x / week   Duration --  8 weeks   Treatment/Interventions Aspiration precaution training;Diet toleration management by SLP;SLP instruction and feedback;Compensatory strategies;Patient/family education;Oral motor exercises;Trials of upgraded texture/liquids   Potential to Achieve Goals Good   Potential Considerations Previous level of function   SLP Home Exercise Plan Pt will complete HEP as assigned to facilitate carryover of treatment strategies and techniques in home environment with assist from spouse.   Consulted and Agree with Plan of Care Patient   Family Member Consulted patient's husband       Patient will benefit from skilled therapeutic intervention in order to improve the following deficits and impairments:   Dysarthria following other cerebrovascular disease  Cognitive communication deficit  Dysphagia, oropharyngeal phase    Problem List Patient Active Problem List   Diagnosis Date Noted  . Dysmetria   . Diabetes mellitus type 2 in nonobese (HCC)   . Supplemental oxygen dependent   . Benign essential HTN   . Hypokalemia   . Hypoalbuminemia due to  protein-calorie malnutrition ([place])   . Embolic stroke of right basal ganglia ([place]) 10/12/2016  . Oropharyngeal dysphagia   . Stroke ([employer]) 10/09/2016  . Stroke (cerebrum) ([place]) 10/09/2016  . 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 Frequent falls 04/21/2016  . 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 Type 2 diabetes mellitus with peripheral neuropathy (HCC)   . Fibromyalgia   . Chronic pain syndrome   . Chronic obstructive pulmonary disease ([place])   . Gait disturbance, post-stroke   . Dysarthria, post-stroke   . Dysphagia, post-stroke   . 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 Mixed hyperlipidemia   . 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  . Allergic rhinitis 12/17/2015  . Primary osteoarthritis of both knees 11/19/2015  . Obesity (BMI 30-39.9) 09/04/2015  . Essential hypertension 09/04/2015  . Constipation 09/04/2015  . Asthma 08/26/2015  . Raynaud's phenomenon 08/26/2015  . Gastro-esophageal reflux disease with esophagitis 08/26/2015  . Spinal stenosis 08/26/2015  . Vitamin D deficiency 08/26/2015  . Diabetes mellitus, type 2 ([place]) 08/22/2015  . HNP (herniated nucleus pulposus), cervical 07/10/2015  . Cervical radicular pain 04/30/2013  . Knee osteoarthritis 01/05/2012   Thank you,  Genene Churn, CCC-SLP [phone]  [facility] 12/20/2016, 2:36 PM  [place] [address] [place], [place], [address] Phone: [phone]   Fax:  [phone]   Name: Susan Patel MRN: [id number] Date of Birth: [birth year]

## 2016-12-21 ENCOUNTER — Other Ambulatory Visit: Payer: Self-pay | Admitting: Pediatrics

## 2016-12-22 ENCOUNTER — Ambulatory Visit (HOSPITAL_COMMUNITY): Payer: Medicare Other | Admitting: Speech Pathology

## 2016-12-22 ENCOUNTER — Ambulatory Visit: Payer: Medicare Other

## 2016-12-22 DIAGNOSIS — I69822 Dysarthria following other cerebrovascular disease: Secondary | ICD-10-CM | POA: Diagnosis not present

## 2016-12-22 DIAGNOSIS — R1312 Dysphagia, oropharyngeal phase: Secondary | ICD-10-CM

## 2016-12-22 DIAGNOSIS — R41841 Cognitive communication deficit: Secondary | ICD-10-CM

## 2016-12-23 ENCOUNTER — Encounter (HOSPITAL_COMMUNITY): Payer: Self-pay | Admitting: Speech Pathology

## 2016-12-23 DIAGNOSIS — I69822 Dysarthria following other cerebrovascular disease: Secondary | ICD-10-CM | POA: Diagnosis not present

## 2016-12-23 NOTE — Therapy (Signed)
[place], [place], [address] Phone: [phone]   Fax:  [phone]  Speech Language Pathology Treatment  Patient Details  Name: Susan Patel MRN: [id number] Date of Birth: 08/06/1940 Referring Provider: Lauraine Rinne  Encounter Date: 12/22/2016      End of Session - 12/22/16 1429    Visit Number 6   Number of Visits 16   Date for SLP Re-Evaluation 12/23/16   Authorization Type [employer]   SLP Start Time 1355   SLP Stop Time  1435   SLP Time Calculation (min) 40 min   Activity Tolerance Patient tolerated treatment well      Past Medical History:  Diagnosis Date  . Arthritis    'all over"  . Asthma   . Chronic pain   . COPD (chronic obstructive pulmonary disease) (HCC)    O2 per [place] at nights   . Depression   . Diabetes mellitus without complication ([place])    borderline- , states she was on med., but MD told her "everything is under control so I threw the bottle away"  . Fibromyalgia   . GERD (gastroesophageal reflux disease)   . Hypertension   . Neuromuscular disorder (HCC)    parkinson, neuropathy- both feet & hands.  . Stroke [facility])    residual dysarthria    Past Surgical History:  Procedure Laterality Date  . ABDOMINAL HYSTERECTOMY    . ANTERIOR CERVICAL DECOMP/DISCECTOMY FUSION N/A 07/10/2015   Procedure: Cervical five-six, Cervical six-seven anterior cervical decompression with fusion plating and bonegraft;  Surgeon: Jovita Gamma, MD;  Location: [facility];  Service: Neurosurgery;  Laterality: N/A;  . APPENDECTOMY    . BIOPSY EYE MUSCLE  03/20/2016   biopsy vessel to right eye due to swelling  . EYE SURGERY Bilateral    cataracts removed, /w "cyrstal lenses"   . SHOULDER ARTHROSCOPY Right    x2   RCR- spurs removed     There were no vitals filed for this visit.      Subjective Assessment - 12/22/16 1427    Subjective "I can't wait to go to [place]."   Currently in Pain?  No/denies          ADULT SLP TREATMENT - 12/23/16 0001      General Information   Behavior/Cognition Alert;Cooperative;Pleasant mood   Patient Positioning Upright in chair   Oral care provided N/A   HPI Susan Patel is a 76 y.o. female COPD- oxygen dependent, T2DM, Neuropathy, chronic pain, CVA with residual visual deficits?, Parkinsonism; who was admitted on 10/09/16 with inability to speak, left sided weakness and difficulty walking. CT head done revealing suspicion of acute right basal ganglia infarct, subacute right occipital infarct, left basal infarct question age and extensive small vessel disease. She receive tPA and continues on ASA for secondary stroke prevention.  MRI brain done revealing acute infarcts right basal ganglia anterior right parietal white matter with chronic right occipital cortical infarct. CTA head/neck showed severe progressive advanced intracranial atherosclerosis.  2 D echo revealed EF 60-65%, with grade one diastolic dysfunction. Most recent MBSS was completed 10/19/2016 and she was started on dysphagia 1, honey liquids. ASA changed to plavix due to progressive intracranial atherosclerosis  and stroke felt to be thrombotic due to small vessel disease. She is referred for outpatient SLP to address cognitive communication deficits and dysphagia.      Treatment Provided   Treatment provided Cognitive-Linquistic     Pain Assessment  Pain Assessment No/denies pain     Cognitive-Linquistic Treatment   Treatment focused on Dysarthria;Patient/family/caregiver education   Skilled Treatment audio recording with pt self evaluation, emphasis on increasing vocal intensity, breath support, and adding pauses in speech, divergent naming, responding to open ended questions     Assessment / Recommendations / Plan   Plan Discharge SLP treatment due to (comment);All goals met          SLP Education - 12/23/16 1429    Education provided Yes   Education Details Discussed D/C  from SLP services at this time; seek ENT consult for voice changes if desired; can do MBSS if Pt/spouse change mind   Person(s) Educated Patient;Spouse   Methods Explanation   Comprehension Verbalized understanding          SLP Short Term Goals - 12/23/16 1443      SLP SHORT TERM GOAL #1   Title Pt. will participate in clinical swallow evaluation next session and likely MBSS in 1-2 weeks   Baseline last MBS 10/19/2016 with rec for puree and HTL   Time 2   Period Weeks   Status Partially Met     SLP SHORT TERM GOAL #2   Title Pt will accurately produce requested sounds in 2-3 syllable words (alveolars) with 80% acc with reps of 5 each with mod visual cue from SLP.   Baseline 50% accuracy    Time 8   Period Weeks   Status Achieved     SLP SHORT TERM GOAL #3   Title Pt. will complete oral motor exercises, pharyngeal strengthening, and lingual ROM exercises with accuracy in 8/10 trials given minimal feedback.    Baseline 50% accuracy    Time 8   Period Weeks   Status Achieved     SLP SHORT TERM GOAL #4   Title Pt. will verbalize response to hypothetical problem solving situations with 90% acc given current safety precautions with min assistance.    Baseline 70% accuracy    Time 8   Period Weeks   Status Achieved          SLP Long Term Goals - 12/23/16 1444      SLP LONG TERM GOAL #1   Title Pt. will increase speech intelligibility to 85% during conversational exchanges with no assistance.    Baseline 60% accuracy    Time 8   Period Weeks   Status Partially Met     SLP LONG TERM GOAL #2   Title Pt. will complete expressive language tasks with 85% accuracy and no assistance.    Baseline 20% accuracy    Time 8   Period Weeks   Status Achieved     SLP LONG TERM GOAL #3   Title Pt. will safelty swallow least restrictive diet with no s/s of aspiration in 9/10 trials and no cues.    Baseline 50% accuracy    Time 8   Period Weeks   Status Achieved          Plan  - 12/22/16 1431    Clinical Impression Statement Improved speech intelligibility and vocal quality noted today. Pt read list of multisyllabic words with 100% intelligibility after initial cues for strategies; 89% intelligible at the sentence level (pt generated sentence with multisyllabic word. Speech intelligibility judged to be ~84% intelligible at the conversation level (as judged by need for SLP to request clarification at times). Pt able to modify message when misunderstood. Pt's attendance has been inconsistent due to her travel schedule.  She does experience "good and bad days" when her speech intelligibility seems to fluctuate. Pt will be traveling again for two separate weeks. Recommend D/C from skilled SLP services at this time. Pt is pleased with progress. SLP advised Pt and spouse to seek ENT consult if she has concerns regarding voice changes and also to notify MD/SLP if concerns regarding swallowing (can complete MBSS). They are in agreement.   Speech Therapy Frequency --   Duration --  8 weeks   Treatment/Interventions Aspiration precaution training;Diet toleration management by SLP;SLP instruction and feedback;Compensatory strategies;Patient/family education;Oral motor exercises;Trials of upgraded texture/liquids   Potential to Achieve Goals Good   Potential Considerations Previous level of function   SLP Home Exercise Plan Pt will complete HEP as assigned to facilitate carryover of treatment strategies and techniques in home environment with assist from spouse.   Consulted and Agree with Plan of Care Patient   Family Member Consulted patient's husband       Patient will benefit from skilled therapeutic intervention in order to improve the following deficits and impairments:   Dysarthria following other cerebrovascular disease  Cognitive communication deficit  Dysphagia, oropharyngeal phase      G-Codes - 01-11-2017 1444    Functional Assessment Tool Used skilled observation     Functional Limitations Motor speech   Motor Speech Goal Status [phone]) At least 20 percent but less than 40 percent impaired, limited or restricted   Motor Speech Goal Status (W6568) At least 1 percent but less than 20 percent impaired, limited or restricted      Problem List Patient Active Problem List   Diagnosis Date Noted  . Dysmetria   . Diabetes mellitus type 2 in nonobese (HCC)   . Supplemental oxygen dependent   . Benign essential HTN   . Hypokalemia   . Hypoalbuminemia due to protein-calorie malnutrition ([place])   . Embolic stroke of right basal ganglia ([place]) 10/12/2016  . Oropharyngeal dysphagia   . Stroke ([place]) 10/09/2016  . Stroke (cerebrum) ([place]) 10/09/2016  . 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 Frequent falls 04/21/2016  . 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 Type 2 diabetes mellitus with peripheral neuropathy (HCC)   . Fibromyalgia   . Chronic pain syndrome   . Chronic obstructive pulmonary disease ([place])   . Gait disturbance, post-stroke   . Dysarthria, post-stroke   . Dysphagia, post-stroke   . Cerebrovascular accident (CVA) due to occlusion of basilar artery ([place])   . Mixed hyperlipidemia   . 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  . Allergic rhinitis 12/17/2015  . Primary osteoarthritis of both knees 11/19/2015  . Obesity (BMI 30-39.9) 09/04/2015  . Essential hypertension 09/04/2015  . Constipation 09/04/2015  . Asthma 08/26/2015  . Raynaud's phenomenon 08/26/2015  . Gastro-esophageal reflux disease with esophagitis 08/26/2015  . Spinal stenosis 08/26/2015  . Vitamin D deficiency 08/26/2015  . Diabetes mellitus, type 2 ([place]) 08/22/2015  . HNP (herniated nucleus pulposus), cervical 07/10/2015  . Cervical radicular pain 04/30/2013  . Knee osteoarthritis 01/05/2012   SPEECH THERAPY DISCHARGE SUMMARY  Visits from Start of Care: 6   Current functional level related to goals / functional  outcomes: See above   Remaining deficits: Mild to mild/mod dysarthria   Education / Equipment: Completed  Plan: Patient agrees to discharge.  Patient goals were partially met. Patient is being discharged due to meeting the stated rehab goals.  ?????  Thank you,  Genene Churn, [place]  [facility] 12/23/2016, 2:45 PM  [facility] [address] [place], [place], [address] Phone: [phone]   Fax:  [phone]   Name: NIANI MOURER MRN: [id number] Date of Birth: Mar 08, 1941

## 2016-12-24 ENCOUNTER — Other Ambulatory Visit: Payer: Self-pay | Admitting: Pediatrics

## 2016-12-29 ENCOUNTER — Ambulatory Visit (HOSPITAL_COMMUNITY): Payer: Medicare Other | Admitting: Speech Pathology

## 2017-01-01 ENCOUNTER — Other Ambulatory Visit: Payer: Self-pay | Admitting: Pediatrics

## 2017-01-03 ENCOUNTER — Encounter (HOSPITAL_COMMUNITY): Payer: Medicare Other | Admitting: Speech Pathology

## 2017-01-10 ENCOUNTER — Encounter (HOSPITAL_COMMUNITY): Payer: Medicare Other | Admitting: Speech Pathology

## 2017-01-13 ENCOUNTER — Encounter (HOSPITAL_COMMUNITY): Payer: Medicare Other | Admitting: Speech Pathology

## 2017-01-20 ENCOUNTER — Encounter: Payer: Medicare Other | Attending: Physical Medicine & Rehabilitation | Admitting: Physical Medicine & Rehabilitation

## 2017-01-20 ENCOUNTER — Encounter: Payer: Self-pay | Admitting: Physical Medicine & Rehabilitation

## 2017-01-20 VITALS — BP 163/87 | HR 82 | Resp 14

## 2017-01-20 DIAGNOSIS — R269 Unspecified abnormalities of gait and mobility: Secondary | ICD-10-CM | POA: Diagnosis not present

## 2017-01-20 DIAGNOSIS — W19XXXD Unspecified fall, subsequent encounter: Secondary | ICD-10-CM

## 2017-01-20 DIAGNOSIS — I6930 Unspecified sequelae of cerebral infarction: Secondary | ICD-10-CM | POA: Diagnosis not present

## 2017-01-20 DIAGNOSIS — R1310 Dysphagia, unspecified: Secondary | ICD-10-CM | POA: Diagnosis not present

## 2017-01-20 DIAGNOSIS — G894 Chronic pain syndrome: Secondary | ICD-10-CM

## 2017-01-20 DIAGNOSIS — K219 Gastro-esophageal reflux disease without esophagitis: Secondary | ICD-10-CM | POA: Insufficient documentation

## 2017-01-20 DIAGNOSIS — I69319 Unspecified symptoms and signs involving cognitive functions following cerebral infarction: Secondary | ICD-10-CM | POA: Diagnosis not present

## 2017-01-20 DIAGNOSIS — M797 Fibromyalgia: Secondary | ICD-10-CM

## 2017-01-20 DIAGNOSIS — J449 Chronic obstructive pulmonary disease, unspecified: Secondary | ICD-10-CM | POA: Diagnosis not present

## 2017-01-20 DIAGNOSIS — I10 Essential (primary) hypertension: Secondary | ICD-10-CM

## 2017-01-20 DIAGNOSIS — I69398 Other sequelae of cerebral infarction: Secondary | ICD-10-CM

## 2017-01-20 DIAGNOSIS — Z9981 Dependence on supplemental oxygen: Secondary | ICD-10-CM | POA: Insufficient documentation

## 2017-01-20 DIAGNOSIS — G20 Parkinson's disease: Secondary | ICD-10-CM | POA: Diagnosis not present

## 2017-01-20 DIAGNOSIS — E1140 Type 2 diabetes mellitus with diabetic neuropathy, unspecified: Secondary | ICD-10-CM | POA: Diagnosis not present

## 2017-01-20 DIAGNOSIS — F329 Major depressive disorder, single episode, unspecified: Secondary | ICD-10-CM | POA: Insufficient documentation

## 2017-01-20 DIAGNOSIS — R350 Frequency of micturition: Secondary | ICD-10-CM | POA: Diagnosis not present

## 2017-01-20 DIAGNOSIS — I639 Cerebral infarction, unspecified: Secondary | ICD-10-CM | POA: Diagnosis present

## 2017-01-20 DIAGNOSIS — G8929 Other chronic pain: Secondary | ICD-10-CM | POA: Insufficient documentation

## 2017-01-20 NOTE — Progress Notes (Signed)
Subjective:    Patient ID: Susan Patel, female    DOB: 01-Jun-1940, 76 y.o.   MRN: [id number]  HPI 76 y.o. female COPD- oxygen dependent, T2DM, Neuropathy, chronic pain, CVA with residual visual deficits?, Parkinsonism; who presents for follow up for right basal ganglia/parietal infarcts.   Last clinic visit 11/26/16. Husband provides information. Since that time, she has completed therapies.  She is doing her HEP at times.  Husband states he was told by Neurology that patient needed to be referred. She did well with the increase in Gabapentin.  BP is elevated, however, husband states it is normally better at home. Urinary frequency improved on meds. Falls due to mechanical tripping.   Pain Inventory Average Pain 9 Pain Right Now 9 My pain is sharp, burning, stabbing, tingling and aching  In the last 24 hours, has pain interfered with the following? General activity 9 Relation with others 9 Enjoyment of life 9 What TIME of day is your pain at its worst? daytime, evening, night Sleep (in general) Poor  Pain is worse with: bending and standing Pain improves with: medication Relief from Meds: 5  Mobility walk without assistance ability to climb steps?  yes do you drive?  no  Function retired  Neuro/Psych weakness tremor tingling trouble walking spasms dizziness confusion loss of taste or smell suicidal thoughts  Prior Studies Any changes since last visit?  no  Physicians involved in your care Any changes since last visit?  no   Family History  Problem Relation Age of Onset  . Heart disease Mother   . Cancer Father   . Heart disease Father    Social History   Social History  . Marital status: Married    Spouse name: N/A  . Number of children: N/A  . Years of education: N/A   Social History Main Topics  . Smoking status: Never Smoker  . Smokeless tobacco: Never Used  . Alcohol use No  . Drug use: No  . Sexual activity: Yes   Other Topics Concern    . None   Social History Narrative  . None   Past Surgical History:  Procedure Laterality Date  . ABDOMINAL HYSTERECTOMY    . ANTERIOR CERVICAL DECOMP/DISCECTOMY FUSION N/A 07/10/2015   Procedure: Cervical five-six, Cervical six-seven anterior cervical decompression with fusion plating and bonegraft;  Surgeon: Jovita Gamma, MD;  Location: [facility];  Service: Neurosurgery;  Laterality: N/A;  . APPENDECTOMY    . BIOPSY EYE MUSCLE  03/20/2016   biopsy vessel to right eye due to swelling  . EYE SURGERY Bilateral    cataracts removed, /w "cyrstal lenses"   . SHOULDER ARTHROSCOPY Right    x2   RCR- spurs removed    Past Medical History:  Diagnosis Date  . Arthritis    'all over"  . Asthma   . Chronic pain   . COPD (chronic obstructive pulmonary disease) (HCC)    O2 per [place] at nights   . Depression   . Diabetes mellitus without complication ([place])    borderline- , states she was on med., but MD told her "everything is under control so I threw the bottle away"  . Fibromyalgia   . GERD (gastroesophageal reflux disease)   . Hypertension   . Neuromuscular disorder (HCC)    parkinson, neuropathy- both feet & hands.  . Stroke ([place])    residual dysarthria   BP (!) 163/87 (BP Location: Left Arm, Patient Position: Sitting, Cuff Size: Normal)  Pulse 82   Resp 14   SpO2 96%   Opioid Risk Score:   Fall Risk Score:  `1  Depression screen PHQ 2/9  Depression screen [facility] 2/9 11/26/2016 10/29/2016 09/15/2016 08/30/2016 07/15/2016 05/13/2016 04/21/2016  Decreased Interest 0 0 0 0 0 0 0  Down, Depressed, Hopeless 0 0 0 0 0 0 0  PHQ - 2 Score 0 0 0 0 0 0 0  Altered sleeping 0 - - - - - -  Tired, decreased energy 2 - - - - - -  Change in appetite 0 - - - - - -  Feeling bad or failure about yourself  1 - - - - - -  Trouble concentrating 1 - - - - - -  Moving slowly or fidgety/restless 0 - - - - - -  Suicidal thoughts 0 - - - - - -  PHQ-9 Score 4 - - - - - -  Difficult doing work/chores  Somewhat difficult - - - - - -     Review of Systems  HENT: Negative.   Eyes: Negative.   Respiratory: Positive for shortness of breath and wheezing.   Cardiovascular: Negative.   Gastrointestinal: Positive for constipation.  Endocrine: Negative.   Genitourinary: Negative.   Musculoskeletal: Positive for arthralgias, back pain, gait problem and myalgias.       Spasms   Skin: Negative.   Allergic/Immunologic: Negative.   Neurological: Positive for dizziness, tremors, speech difficulty, weakness and numbness.       Tingling  Hematological: Negative.   Psychiatric/Behavioral: Positive for confusion and decreased concentration. The patient is nervous/anxious.   All other systems reviewed and are negative.     Objective:   Physical Exam Constitutional: She appears well-developed and well-nourished. NAD HENT: Normocephalic and atraumatic.  Eyes: EOMI. No discharge.  Cardiovascular: RRR. No JVD. Respiratory: Normal effort.  Clear.  GI: Soft. Bowel sounds are normal.   Musculoskeletal: She exhibits no edema or tenderness.  Neurological: She is alert and oriented.  Dyssarthria.  Right facial weakness.   Motor  B/l UE 4+/5 prox to distal.  B/l LE: 4+/5 prox to distal.  No dysmetria Skin: Skin is warm and dry.  Psychiatric: Flat affect. Slowed.     Assessment & Plan:  76 y.o. female COPD- oxygen dependent, T2DM, Neuropathy, chronic pain, CVA with residual visual deficits?, Parkinsonism; who presents for follow up for right basal ganglia/parietal infarcts.   1.  Functional and swallowing deficits secondary to right basal ganglia /parietal infarcts on 5/26  Cont HEP, completed therapies  Follow up with Neurology, states she was told she needs a referral, referral made  Pt would like to return to drive.  Educated on gradual returning to drive with licensed driver on smaller then larger roads  2. Chronic pain/Pain Management/Fibromyalgia:   Cont meds  Increase Neurontin to 300  TID (currently taking BID)  3. HTN  Elevated in office  Encourage ambulatory monitoring  4. Urinary frequency  Cont Tolterodine 1 mg BID  5. Falls  Due to environmental factors

## 2017-01-23 ENCOUNTER — Other Ambulatory Visit: Payer: Self-pay | Admitting: Physical Medicine & Rehabilitation

## 2017-02-02 ENCOUNTER — Other Ambulatory Visit: Payer: Self-pay | Admitting: Pediatrics

## 2017-02-02 DIAGNOSIS — M797 Fibromyalgia: Secondary | ICD-10-CM

## 2017-02-11 ENCOUNTER — Other Ambulatory Visit: Payer: Self-pay | Admitting: Pediatrics

## 2017-02-14 ENCOUNTER — Other Ambulatory Visit: Payer: Self-pay | Admitting: Pediatrics

## 2017-02-14 NOTE — Telephone Encounter (Signed)
Last seen 10/29/16 Dr Evette Doffing

## 2017-02-15 NOTE — Telephone Encounter (Signed)
Last seen 10/29/16  Dr Evette Doffing

## 2017-02-17 ENCOUNTER — Encounter: Payer: Medicare Other | Admitting: Physical Medicine & Rehabilitation

## 2017-03-03 ENCOUNTER — Ambulatory Visit (INDEPENDENT_AMBULATORY_CARE_PROVIDER_SITE_OTHER): Payer: Medicare Other | Admitting: Family Medicine

## 2017-03-03 ENCOUNTER — Encounter: Payer: Self-pay | Admitting: Family Medicine

## 2017-03-03 ENCOUNTER — Encounter: Payer: Medicare Other | Attending: Physical Medicine & Rehabilitation | Admitting: Physical Medicine & Rehabilitation

## 2017-03-03 ENCOUNTER — Encounter: Payer: Self-pay | Admitting: Physical Medicine & Rehabilitation

## 2017-03-03 VITALS — BP 146/76 | HR 78

## 2017-03-03 VITALS — BP 143/86 | HR 77 | Temp 98.1°F | Ht 61.0 in | Wt 202.0 lb

## 2017-03-03 DIAGNOSIS — R350 Frequency of micturition: Secondary | ICD-10-CM

## 2017-03-03 DIAGNOSIS — J449 Chronic obstructive pulmonary disease, unspecified: Secondary | ICD-10-CM | POA: Diagnosis not present

## 2017-03-03 DIAGNOSIS — G894 Chronic pain syndrome: Secondary | ICD-10-CM

## 2017-03-03 DIAGNOSIS — I6930 Unspecified sequelae of cerebral infarction: Secondary | ICD-10-CM | POA: Diagnosis not present

## 2017-03-03 DIAGNOSIS — M779 Enthesopathy, unspecified: Secondary | ICD-10-CM | POA: Diagnosis not present

## 2017-03-03 DIAGNOSIS — F329 Major depressive disorder, single episode, unspecified: Secondary | ICD-10-CM | POA: Insufficient documentation

## 2017-03-03 DIAGNOSIS — M797 Fibromyalgia: Secondary | ICD-10-CM | POA: Insufficient documentation

## 2017-03-03 DIAGNOSIS — R1310 Dysphagia, unspecified: Secondary | ICD-10-CM | POA: Insufficient documentation

## 2017-03-03 DIAGNOSIS — I10 Essential (primary) hypertension: Secondary | ICD-10-CM | POA: Diagnosis not present

## 2017-03-03 DIAGNOSIS — I69319 Unspecified symptoms and signs involving cognitive functions following cerebral infarction: Secondary | ICD-10-CM | POA: Diagnosis not present

## 2017-03-03 DIAGNOSIS — I69398 Other sequelae of cerebral infarction: Secondary | ICD-10-CM

## 2017-03-03 DIAGNOSIS — R269 Unspecified abnormalities of gait and mobility: Secondary | ICD-10-CM

## 2017-03-03 DIAGNOSIS — Z9981 Dependence on supplemental oxygen: Secondary | ICD-10-CM | POA: Diagnosis not present

## 2017-03-03 DIAGNOSIS — G8929 Other chronic pain: Secondary | ICD-10-CM | POA: Diagnosis not present

## 2017-03-03 DIAGNOSIS — E1140 Type 2 diabetes mellitus with diabetic neuropathy, unspecified: Secondary | ICD-10-CM | POA: Insufficient documentation

## 2017-03-03 DIAGNOSIS — G20 Parkinson's disease: Secondary | ICD-10-CM | POA: Insufficient documentation

## 2017-03-03 DIAGNOSIS — K219 Gastro-esophageal reflux disease without esophagitis: Secondary | ICD-10-CM | POA: Insufficient documentation

## 2017-03-03 DIAGNOSIS — I639 Cerebral infarction, unspecified: Secondary | ICD-10-CM | POA: Insufficient documentation

## 2017-03-03 DIAGNOSIS — J40 Bronchitis, not specified as acute or chronic: Secondary | ICD-10-CM | POA: Diagnosis not present

## 2017-03-03 MED ORDER — HYDROCODONE-HOMATROPINE 5-1.5 MG/5ML PO SYRP: 5.0000 mL | ORAL_SOLUTION | Freq: Four times a day (QID) | ORAL | 0 refills | Status: DC | PRN

## 2017-03-03 MED ORDER — CEFDINIR 300 MG PO CAPS: 300.0000 mg | ORAL_CAPSULE | Freq: Two times a day (BID) | ORAL | 0 refills | Status: DC

## 2017-03-03 NOTE — Progress Notes (Signed)
BP (!) 145/92   Pulse 77   Temp 98.1 F (36.7 C) (Oral)   Ht 5\' 1"  (1.549 m)   Wt 202 lb (91.6 kg)   BMI 38.17 kg/m    Subjective:    Patient ID: Susan Patel, female    DOB: May 02, 1941, 76 y.o.   MRN: [id number]  HPI: Susan Patel is a 76 y.o. female presenting on 03/03/2017 for Bronchitis (cough, chest congestion) and Hand Pain (thumb and 1st digit of left hand, fell about one month ago)   HPI Cough and chest congestion and sore throat Patient comes in today complaining of cough and chest congestion and sore throat this been going on for the past week but has worsened over the past couple days.  The cough is keeping her up at night and she has a lot of hoarseness.  She has been using some over-the-counter stuff which is not been helping significantly.  She is also been using Flonase which is not helping significantly.  Patient does have a history of COPD and recurrent bronchitis.  She denies any fevers or chills or shortness of breath or wheezing today.  Thumb and finger pain on right hand Patient comes in complaining of pain in her right thumb and first finger on that hand.  This been going on for about a month since she fell and stretched it.  She says it does not hurt all the time but more with motion and repetitive use.  She denies any swelling or redness or warmth or fevers or chills.  Relevant past medical, surgical, family and social history reviewed and updated as indicated. Interim medical history since our last visit reviewed. Allergies and medications reviewed and updated.  Review of Systems  Constitutional: Negative for chills and fever.  HENT: Positive for congestion, postnasal drip, rhinorrhea, sinus pressure, sneezing and sore throat. Negative for ear discharge and ear pain.   Eyes: Negative for pain, redness and visual disturbance.  Respiratory: Negative for chest tightness and shortness of breath.   Cardiovascular: Negative for chest pain and leg swelling.    Genitourinary: Negative for difficulty urinating and dysuria.  Musculoskeletal: Positive for arthralgias. Negative for back pain and gait problem.  Skin: Negative for rash.  Neurological: Negative for light-headedness and headaches.  Psychiatric/Behavioral: Negative for agitation and behavioral problems.  All other systems reviewed and are negative.   Per HPI unless specifically indicated above        Objective:    BP (!) 145/92   Pulse 77   Temp 98.1 F (36.7 C) (Oral)   Ht 5\' 1"  (1.549 m)   Wt 202 lb (91.6 kg)   BMI 38.17 kg/m   Wt Readings from Last 3 Encounters:  03/03/17 202 lb (91.6 kg)  10/29/16 186 lb (84.4 kg)  10/20/16 185 lb 0.9 oz (83.9 kg)    Physical Exam  Constitutional: She is oriented to person, place, and time. She appears well-developed and well-nourished. No distress.  HENT:  Right Ear: Tympanic membrane, external ear and ear canal normal.  Left Ear: Tympanic membrane, external ear and ear canal normal.  Nose: Mucosal edema and rhinorrhea present. No epistaxis. Right sinus exhibits no maxillary sinus tenderness and no frontal sinus tenderness. Left sinus exhibits no maxillary sinus tenderness and no frontal sinus tenderness.  Mouth/Throat: Uvula is midline and mucous membranes are normal. Posterior oropharyngeal edema and posterior oropharyngeal erythema present. No oropharyngeal exudate or tonsillar abscesses.  Eyes: Conjunctivae and EOM are normal.  Neck:  Neck supple. No thyromegaly present.  Cardiovascular: Normal rate, regular rhythm, normal heart sounds and intact distal pulses.   No murmur heard. Pulmonary/Chest: Effort normal and breath sounds normal. No respiratory distress. She has no wheezes.  Musculoskeletal: Normal range of motion. She exhibits tenderness (Tenderness over tendons between the thumb and first finger in a V-shaped pattern.  No swelling or redness or warmth.  No sign of fracture or deformity.  Range of motion and strength  intact). She exhibits no edema.  Lymphadenopathy:    She has no cervical adenopathy.  Neurological: She is alert and oriented to person, place, and time. Coordination normal.  Skin: Skin is warm and dry. No rash noted. She is not diaphoretic.  Psychiatric: She has a normal mood and affect. Her behavior is normal.  Vitals reviewed.      Assessment & Plan:   Problem List Items Addressed This Visit    None    Visit Diagnoses    Bronchitis    -  Primary   Relevant Medications   cefdinir (OMNICEF) 300 MG capsule   HYDROcodone-homatropine (HYCODAN) 5-1.5 MG/5ML syrup   Tendinitis       Use topical anti-inflammatories and stretching and ice and heat       Follow up plan: Return if symptoms worsen or fail to improve.  Counseling provided for all of the vaccine components No orders of the defined types were placed in this encounter.   Caryl Pina, MD [facility] 03/03/2017, 3:44 PM

## 2017-03-03 NOTE — Progress Notes (Signed)
Subjective:    Patient ID: Susan Patel, female    DOB: June 20, 1940, 76 y.o.   MRN: [id number]  HPI 76 y.o. female COPD- oxygen dependent, T2DM, Neuropathy, chronic pain, CVA with residual visual deficits?, Parkinsonism; who presents for follow up for right basal ganglia/parietal infarcts.   Last clinic visit 01/20/17. Since last visit, she states she feels she is developing bronchitis since yesterday.  Husband present, provides most of history. Pt still has not followed up with Neurology. She states she is doing HEP, but husband states she is not.  She is not driving because husband feels she is not progressing. BP is still elevated.  She is tolerating Gabapentin TID. Her bladder meds are helping as well.  [place].   Pain Inventory Average Pain 9 Pain Right Now 9 My pain is sharp, burning, stabbing, tingling and aching  In the last 24 hours, has pain interfered with the following? General activity 9 Relation with others 8 Enjoyment of life 9 What TIME of day is your pain at its worst? daytime, evening, night Sleep (in general) Fair  Pain is worse with: bending and standing Pain improves with: rest, therapy/exercise, pacing activities, medication and TENS Relief from Meds: 8  Mobility walk without assistance ability to climb steps?  yes do you drive?  no  Function retired  Neuro/Psych weakness tremor tingling trouble walking spasms dizziness confusion loss of taste or smell suicidal thoughts  Prior Studies Any changes since last visit?  no  Physicians involved in your care Any changes since last visit?  no   Family History  Problem Relation Age of Onset  . Heart disease Mother   . Cancer Father   . Heart disease Father    Social History   Social History  . Marital status: Married    Spouse name: N/A  . Number of children: N/A  . Years of education: N/A   Social History Main Topics  . Smoking status: Never  Smoker  . Smokeless tobacco: Never Used  . Alcohol use No  . Drug use: No  . Sexual activity: Yes   Other Topics Concern  . None   Social History Narrative  . None   Past Surgical History:  Procedure Laterality Date  . ABDOMINAL HYSTERECTOMY    . ANTERIOR CERVICAL DECOMP/DISCECTOMY FUSION N/A 07/10/2015   Procedure: Cervical five-six, Cervical six-seven anterior cervical decompression with fusion plating and bonegraft;  Surgeon: Jovita Gamma, MD;  Location: [facility];  Service: Neurosurgery;  Laterality: N/A;  . APPENDECTOMY    . BIOPSY EYE MUSCLE  03/20/2016   biopsy vessel to right eye due to swelling  . EYE SURGERY Bilateral    cataracts removed, /w "cyrstal lenses"   . SHOULDER ARTHROSCOPY Right    x2   RCR- spurs removed    Past Medical History:  Diagnosis Date  . Arthritis    'all over"  . Asthma   . Chronic pain   . COPD (chronic obstructive pulmonary disease) (HCC)    O2 per [place] at nights   . Depression   . Diabetes mellitus without complication ([place])    borderline- , states she was on med., but MD told her "everything is under control so I threw the bottle away"  . Fibromyalgia   . GERD (gastroesophageal reflux disease)   . Hypertension   . Neuromuscular disorder (HCC)    parkinson, neuropathy- both feet & hands.  . Stroke (  [place])    residual dysarthria   BP (!) 146/76   Pulse 78   SpO2 95%   Opioid Risk Score:   Fall Risk Score:  `1  Depression screen PHQ 2/9  Depression screen [facility] 2/9 11/26/2016 10/29/2016 09/15/2016 08/30/2016 07/15/2016 05/13/2016 04/21/2016  Decreased Interest 0 0 0 0 0 0 0  Down, Depressed, Hopeless 0 0 0 0 0 0 0  PHQ - 2 Score 0 0 0 0 0 0 0  Altered sleeping 0 - - - - - -  Tired, decreased energy 2 - - - - - -  Change in appetite 0 - - - - - -  Feeling bad or failure about yourself  1 - - - - - -  Trouble concentrating 1 - - - - - -  Moving slowly or fidgety/restless 0 - - - - - -  Suicidal thoughts 0 - - - - - -  PHQ-9 Score  4 - - - - - -  Difficult doing work/chores Somewhat difficult - - - - - -     Review of Systems  HENT: Negative.   Eyes: Negative.   Respiratory: Positive for shortness of breath and wheezing.   Cardiovascular: Negative.   Gastrointestinal: Positive for constipation.  Endocrine: Negative.   Genitourinary: Negative.   Musculoskeletal: Positive for arthralgias, back pain, gait problem and myalgias.       Spasms   Skin: Negative.   Allergic/Immunologic: Negative.   Neurological: Positive for dizziness, tremors, speech difficulty, weakness and numbness.       Tingling  Hematological: Negative.   Psychiatric/Behavioral: Positive for confusion and decreased concentration. The patient is nervous/anxious.   All other systems reviewed and are negative.     Objective:   Physical Exam Constitutional: She appears well-developed and well-nourished. NAD HENT: Normocephalic and [id number].  Eyes: EOMI. No discharge.  Cardiovascular: RRR. No JVD. Respiratory: Normal effort.  Clear.  GI: Soft. Bowel sounds are normal.   Musculoskeletal: She exhibits no edema or tenderness.  Neurological: She is alert and oriented.  Dyssarthria.  Right facial weakness.   Motor  B/l UE 4+/5 prox to distal.  B/l LE: 4+/5 prox to distal.  No dysmetria Skin: Skin is warm and dry.  Psychiatric: Flat affect. Slowed.    Assessment & Plan:  76 y.o. female COPD- oxygen dependent, T2DM, Neuropathy, chronic pain, CVA with residual visual deficits?, Parkinsonism; who presents for follow up for right basal ganglia/parietal infarcts.   1.  Functional and swallowing deficits secondary to right basal ganglia /parietal infarcts on 5/26  Cont HEP, completed therapies  Follow up with Neurology, referral made, encouraged again  Husband now does not feel comfortable with patient driving  2. Chronic pain/Pain Management/Fibromyalgia:   Cont meds  Cont Neurontin 300 TID  3. HTN  Elevated in office again  Encourage  ambulatory monitoring  4. Urinary frequency  Cont Tolterodine 1 mg BID  5. Falls  Due to environmental factors and inappropriate activities  Educated again

## 2017-03-04 ENCOUNTER — Other Ambulatory Visit: Payer: Self-pay | Admitting: Pediatrics

## 2017-03-04 DIAGNOSIS — M797 Fibromyalgia: Secondary | ICD-10-CM

## 2017-03-07 ENCOUNTER — Other Ambulatory Visit: Payer: Self-pay | Admitting: *Deleted

## 2017-03-07 MED ORDER — CLOPIDOGREL BISULFATE 75 MG PO TABS: 75.0000 mg | ORAL_TABLET | Freq: Every day | ORAL | 0 refills | Status: DC

## 2017-03-07 MED ORDER — FAMOTIDINE 20 MG PO TABS: 20.0000 mg | ORAL_TABLET | Freq: Two times a day (BID) | ORAL | 0 refills | Status: DC

## 2017-03-07 MED ORDER — PANTOPRAZOLE SODIUM 40 MG PO TBEC: 40.0000 mg | DELAYED_RELEASE_TABLET | Freq: Every day | ORAL | 0 refills | Status: DC | PRN

## 2017-03-07 NOTE — Addendum Note (Signed)
Addended by: Antonietta Barcelona D on: 03/07/2017 11:38 AM   Modules accepted: Orders

## 2017-03-07 NOTE — Addendum Note (Signed)
Addended by: Antonietta Barcelona D on: 03/07/2017 11:47 AM   Modules accepted: Orders

## 2017-03-12 ENCOUNTER — Other Ambulatory Visit: Payer: Self-pay | Admitting: Family Medicine

## 2017-03-12 DIAGNOSIS — J40 Bronchitis, not specified as acute or chronic: Secondary | ICD-10-CM

## 2017-03-13 ENCOUNTER — Other Ambulatory Visit: Payer: Self-pay | Admitting: Pediatrics

## 2017-03-13 DIAGNOSIS — G8929 Other chronic pain: Secondary | ICD-10-CM

## 2017-03-14 ENCOUNTER — Ambulatory Visit (INDEPENDENT_AMBULATORY_CARE_PROVIDER_SITE_OTHER): Payer: Medicare Other

## 2017-03-14 ENCOUNTER — Encounter: Payer: Self-pay | Admitting: Family Medicine

## 2017-03-14 ENCOUNTER — Ambulatory Visit (INDEPENDENT_AMBULATORY_CARE_PROVIDER_SITE_OTHER): Payer: Medicare Other | Admitting: Family Medicine

## 2017-03-14 VITALS — BP 138/80 | HR 83 | Temp 97.0°F | Ht 61.0 in | Wt 204.0 lb

## 2017-03-14 DIAGNOSIS — M545 Low back pain, unspecified: Secondary | ICD-10-CM

## 2017-03-14 DIAGNOSIS — M25562 Pain in left knee: Secondary | ICD-10-CM

## 2017-03-14 DIAGNOSIS — R296 Repeated falls: Secondary | ICD-10-CM

## 2017-03-14 DIAGNOSIS — M25522 Pain in left elbow: Secondary | ICD-10-CM | POA: Diagnosis not present

## 2017-03-14 DIAGNOSIS — M542 Cervicalgia: Secondary | ICD-10-CM

## 2017-03-14 DIAGNOSIS — S52022A Displaced fracture of olecranon process without intraarticular extension of left ulna, initial encounter for closed fracture: Secondary | ICD-10-CM

## 2017-03-14 NOTE — Progress Notes (Signed)
BP 138/80   Pulse 83   Temp (!) 97 F (36.1 C) (Oral)   Ht 5\' 1"  (1.549 m)   Wt 204 lb (92.5 kg)   BMI 38.55 kg/m    Subjective:    Patient ID: Susan Patel, female    DOB: January 16, 1941, 76 y.o.   MRN: [id number]  HPI: Susan Patel is a 76 y.o. female presenting on 03/14/2017 for Pain in left elbow, left knee, tailbone, right ankle (four falls in the last week)   HPI Recurrent falls Patient comes in today complaining of recurrent falls and unsafe issues with ambulation.  She has been having recurrent falls but has been picking up in frequency and now she has fallen and hurt her left elbow and right ankle and her back and her neck on different times that she has fallen over the past few days.  She denies any numbness or weakness but has a lot of pain and also says she has fibromyalgia.  She also has overlying bruising on her arm but nowhere else.  There is no loss of range of motion  Relevant past medical, surgical, family and social history reviewed and updated as indicated. Interim medical history since our last visit reviewed. Allergies and medications reviewed and updated.  Review of Systems  Constitutional: Negative for chills and fever.  Eyes: Negative for visual disturbance.  Respiratory: Negative for chest tightness and shortness of breath.   Cardiovascular: Negative for chest pain and leg swelling.  Musculoskeletal: Positive for arthralgias and joint swelling. Negative for back pain and gait problem.  Skin: Positive for color change. Negative for rash.  Neurological: Negative for dizziness, light-headedness and headaches.  Psychiatric/Behavioral: Negative for agitation and behavioral problems.  All other systems reviewed and are negative.   Per HPI unless specifically indicated above     Objective:    BP 138/80   Pulse 83   Temp (!) 97 F (36.1 C) (Oral)   Ht 5\' 1"  (1.549 m)   Wt 204 lb (92.5 kg)   BMI 38.55 kg/m   Wt Readings from Last 3 Encounters:    03/14/17 204 lb (92.5 kg)  03/03/17 202 lb (91.6 kg)  10/29/16 186 lb (84.4 kg)    Physical Exam  Constitutional: She is oriented to person, place, and time. She appears well-developed and well-nourished. No distress.  Eyes: Conjunctivae are normal.  Cardiovascular: Normal rate, regular rhythm, normal heart sounds and intact distal pulses.   No murmur heard. Pulmonary/Chest: Effort normal and breath sounds normal. No respiratory distress. She has no wheezes. She has no rales.  Musculoskeletal: Normal range of motion. She exhibits tenderness (Tenderness overlying the anterior ankle and posterior left elbow with bruising on the elbow.  She also has tenderness around T1 level and L4-S1 level.). She exhibits no edema.  Neurological: She is alert and oriented to person, place, and time. Coordination normal.  Skin: Skin is warm and dry. No rash noted. She is not diaphoretic.  Psychiatric: She has a normal mood and affect. Her behavior is normal.  Nursing note and vitals reviewed.   Elbow x-ray: Possible olecranon fracture, will put in sling and send orthopedic  C-spine x-ray, no bony abnormalities noted, patient has previous fusion hardware, await final read from radiology.  L-spine x-ray: No acute bony abnormality noted.  Await final read from radiology.  Left knee x-ray: No acute bony abnormality noted, await final read from radiologist    Assessment & Plan:   Problem List Items  Addressed This Visit    None    Visit Diagnoses    Recurrent falls    -  Primary   Relevant Orders   DG Elbow 2 Views Left (Completed)   DG Knee 1-2 Views Left   DG Lumbar Spine 2-3 Views   DG Cervical Spine Complete   Home Health   Face-to-face encounter (required for Medicare/Medicaid patients)   Olecranon fracture, left, closed, initial encounter       Relevant Orders   DG Elbow 2 Views Left (Completed)   Ambulatory referral to Orthopedic Surgery   Acute pain of left knee       Relevant Orders    DG Knee 1-2 Views Left   Neck pain       Relevant Orders   DG Cervical Spine Complete   Acute midline low back pain without sciatica       Relevant Orders   DG Lumbar Spine 2-3 Views       Follow up plan: Return if symptoms worsen or fail to improve.  Counseling provided for all of the vaccine components Orders Placed This Encounter  Procedures  . DG Elbow 2 Views Left  . DG Knee 1-2 Views Left  . DG Lumbar Spine 2-3 Views  . DG Cervical Spine Complete    Caryl Pina, MD [facility] 03/14/2017, 1:46 PM

## 2017-03-14 NOTE — Telephone Encounter (Signed)
PCP is Dr. Evette Doffing, please forward this request to her

## 2017-03-14 NOTE — Telephone Encounter (Signed)
Needs to be seen

## 2017-03-15 ENCOUNTER — Other Ambulatory Visit: Payer: Self-pay | Admitting: Family Medicine

## 2017-03-15 ENCOUNTER — Encounter (INDEPENDENT_AMBULATORY_CARE_PROVIDER_SITE_OTHER): Payer: Self-pay | Admitting: Orthopaedic Surgery

## 2017-03-15 ENCOUNTER — Ambulatory Visit (INDEPENDENT_AMBULATORY_CARE_PROVIDER_SITE_OTHER): Payer: Medicare Other | Admitting: Orthopaedic Surgery

## 2017-03-15 VITALS — BP 137/83 | HR 78 | Ht 61.0 in | Wt 190.0 lb

## 2017-03-15 DIAGNOSIS — S5002XA Contusion of left elbow, initial encounter: Secondary | ICD-10-CM

## 2017-03-15 NOTE — Progress Notes (Signed)
Office Visit Note   Patient: Susan Patel           Date of Birth: August 30, 1940           MRN: [id number] Visit Date: 03/15/2017              Requested by: Eustaquio Maize, MD [place], [place] [address] PCP: Eustaquio Maize, MD   Assessment & Plan: Visit Diagnoses:  1. Contusion of left elbow, initial encounter          With left elbow olecranon bursitis.  Plan: Elevation ,sling when necessary for comfort she needs to use her walker whenever she embolus decreased fall risk. She will talk with her the physician about whether they think the Parkinson's medicine would decrease her fall risk since she's fallen 4 times in the last week. No change in medications were done today.  Follow-Up Instructions: Return in about 3 weeks (around 04/05/2017).   Orders:  No orders of the defined types were placed in this encounter.  No orders of the defined types were placed in this encounter.     Procedures: No procedures performed   Clinical Data: No additional findings.   Subjective: Chief Complaint  Patient presents with  . Left Elbow - Pain    HPI 76 year old female referred from emergency room after her fourth fall in the last week with pain in the sacrum and coccyx and also left olecranon bursitis. She had small calcified spur at the triceps insertion site that appears to the cracked at the time of her fall. She said 3 previous CVAs and also has Parkinson's and was on medication for Parkinson's but apparently a reactive with some other medicine she had and the Parkinson's medicine has been stopped. Check carpet burn on her elbow and knees and significant the olecranon bursitis on the left. Patient has been on tramadol and states it doesn't seem to give her relief. She has a platform walker that she purchased and uses this at home for ambulation. She had a fall at [facility] last Friday and sprained her right ankle.  Review of Systems positive for type 2 diabetes previous  CVA 3, Parkinson's, cognitive does sit due to stroke, hypertension, obesity, asthma, cervical fusion 2 level, fibromyalgia, otherwise negative as pertains history of present illness.   Objective: Vital Signs: BP 137/83   Pulse 78   Ht 5\' 1"  (1.549 m)   Wt 190 lb (86.2 kg)   BMI 35.90 kg/m   Physical Exam  Constitutional: She is oriented to person, place, and time. She appears well-developed.  Increased BMI patient has slurred speech with some a aphasia. She is here with her husband today.  HENT:  Head: Normocephalic.  Right Ear: External ear normal.  Left Ear: External ear normal.  Eyes: Pupils are equal, round, and reactive to light.  Neck: No tracheal deviation present. No thyromegaly present.  Cardiovascular: Normal rate.   Pulmonary/Chest: Effort normal.  Abdominal: Soft.  Neurological: She is alert and oriented to person, place, and time.  Skin: Skin is warm and dry.  Psychiatric: She has a normal mood and affect. Her behavior is normal.    Ortho Exam left olecranon bursa enlargement ballotable area 4 x 6 cm. Small rug burn on her elbow. No drainage. She has good triceps resistance testing. Small lipoma over the dorsal wrist and distal forearm. She has significant tenderness over the sacrum and coccyx. No pain with pelvic compression over the iliac crest.  Trochanteric bursa some mildly tender. No pain with hip range of motion.  Specialty Comments:  No specialty comments available.  Imaging: No results found.   PMFS History: Patient Active Problem List   Diagnosis Date Noted  . Contusion of left elbow 03/15/2017  . Dysmetria   . Diabetes mellitus type 2 in nonobese (HCC)   . Supplemental oxygen dependent   . Benign essential HTN   . Hypokalemia   . Hypoalbuminemia due to protein-calorie malnutrition ([place])   . Embolic stroke of right basal ganglia ([place]) 10/12/2016  . Oropharyngeal dysphagia   . Stroke ([place]) 10/09/2016  . Stroke (cerebrum) ([place]) 10/09/2016  .  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 Frequent falls 04/21/2016  . 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 Type 2 diabetes mellitus with peripheral neuropathy (HCC)   . Fibromyalgia   . Chronic pain syndrome   . Chronic obstructive pulmonary disease ([place])   . Gait disturbance, post-stroke   . Dysarthria, post-stroke   . Dysphagia, post-stroke   . Cerebrovascular accident (CVA) due to occlusion of basilar artery ([place])   . Mixed hyperlipidemia   . 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  . Allergic rhinitis 12/17/2015  . Primary osteoarthritis of both knees 11/19/2015  . Obesity (BMI 30-39.9) 09/04/2015  . Essential hypertension 09/04/2015  . Constipation 09/04/2015  . Asthma 08/26/2015  . Raynaud's phenomenon 08/26/2015  . Gastro-esophageal reflux disease with esophagitis 08/26/2015  . Spinal stenosis 08/26/2015  . Vitamin D deficiency 08/26/2015  . Diabetes mellitus, type 2 ([place]) 08/22/2015  . HNP (herniated nucleus pulposus), cervical 07/10/2015  . Cervical radicular pain 04/30/2013  . Knee osteoarthritis 01/05/2012   Past Medical History:  Diagnosis Date  . Arthritis    'all over"  . Asthma   . Chronic pain   . COPD (chronic obstructive pulmonary disease) (HCC)    O2 per [place] at nights   . Depression   . Diabetes mellitus without complication ([place])    borderline- , states she was on med., but MD told her "everything is under control so I threw the bottle away"  . Fibromyalgia   . GERD (gastroesophageal reflux disease)   . Hypertension   . Neuromuscular disorder (HCC)    parkinson, neuropathy- both feet & hands.  . Stroke [facility])    residual dysarthria    Family History  Problem Relation Age of Onset  . Heart disease Mother   . Cancer Father   . Heart disease Father     Past Surgical History:  Procedure Laterality Date  . ABDOMINAL HYSTERECTOMY    . ANTERIOR CERVICAL DECOMP/DISCECTOMY FUSION N/A  07/10/2015   Procedure: Cervical five-six, Cervical six-seven anterior cervical decompression with fusion plating and bonegraft;  Surgeon: Jovita Gamma, MD;  Location: [facility];  Service: Neurosurgery;  Laterality: N/A;  . APPENDECTOMY    . BIOPSY EYE MUSCLE  03/20/2016   biopsy vessel to right eye due to swelling  . EYE SURGERY Bilateral    cataracts removed, /w "cyrstal lenses"   . SHOULDER ARTHROSCOPY Right    x2   RCR- spurs removed    Social History   Occupational History  . Not on file.   Social History Main Topics  . Smoking status: Never Smoker  . Smokeless tobacco: Never Used  . Alcohol use No  . Drug use: No  . Sexual activity: Yes

## 2017-03-21 ENCOUNTER — Other Ambulatory Visit: Payer: Self-pay | Admitting: Pediatrics

## 2017-03-21 DIAGNOSIS — K5900 Constipation, unspecified: Secondary | ICD-10-CM

## 2017-03-22 ENCOUNTER — Telehealth: Payer: Self-pay | Admitting: *Deleted

## 2017-03-22 ENCOUNTER — Telehealth: Payer: Self-pay | Admitting: Physical Medicine & Rehabilitation

## 2017-03-22 NOTE — Telephone Encounter (Signed)
Patients husband called and states there has been referral to neuro - I looked the referral is there and the neurologist has tried to reach them by phone.  I called home and it states voicemail box not set up again today.  Cell phone does not answer  Left message.

## 2017-03-22 NOTE — Telephone Encounter (Signed)
We may refill a 90 day supply.  Thanks.

## 2017-03-22 NOTE — Telephone Encounter (Signed)
rec'd fax from pharmacy requesting 90 day supply for tolterodine tartrate 1 mg tab.  Last clinic note indicated continuation...Marland KitchenMarland KitchenPlease advise on 90 day refill

## 2017-03-23 ENCOUNTER — Ambulatory Visit (INDEPENDENT_AMBULATORY_CARE_PROVIDER_SITE_OTHER): Payer: Medicare Other | Admitting: Pediatrics

## 2017-03-23 ENCOUNTER — Other Ambulatory Visit: Payer: Self-pay | Admitting: Physical Medicine & Rehabilitation

## 2017-03-23 ENCOUNTER — Encounter: Payer: Self-pay | Admitting: Pediatrics

## 2017-03-23 VITALS — BP 134/81 | HR 71 | Temp 97.2°F | Ht 61.0 in | Wt 200.4 lb

## 2017-03-23 DIAGNOSIS — R490 Dysphonia: Secondary | ICD-10-CM

## 2017-03-23 DIAGNOSIS — G8929 Other chronic pain: Secondary | ICD-10-CM

## 2017-03-23 DIAGNOSIS — Z79899 Other long term (current) drug therapy: Secondary | ICD-10-CM | POA: Diagnosis not present

## 2017-03-23 DIAGNOSIS — K219 Gastro-esophageal reflux disease without esophagitis: Secondary | ICD-10-CM

## 2017-03-23 DIAGNOSIS — K5900 Constipation, unspecified: Secondary | ICD-10-CM | POA: Diagnosis not present

## 2017-03-23 DIAGNOSIS — R232 Flushing: Secondary | ICD-10-CM

## 2017-03-23 DIAGNOSIS — Z23 Encounter for immunization: Secondary | ICD-10-CM | POA: Diagnosis not present

## 2017-03-23 DIAGNOSIS — R296 Repeated falls: Secondary | ICD-10-CM | POA: Diagnosis not present

## 2017-03-23 DIAGNOSIS — E119 Type 2 diabetes mellitus without complications: Secondary | ICD-10-CM

## 2017-03-23 LAB — BAYER DCA HB A1C WAIVED: HB A1C (BAYER DCA - WAIVED): 6.4 %

## 2017-03-23 MED ORDER — GABAPENTIN 100 MG PO CAPS: ORAL_CAPSULE | ORAL | Status: DC

## 2017-03-23 MED ORDER — LINACLOTIDE 72 MCG PO CAPS: ORAL_CAPSULE | ORAL | 0 refills | Status: DC

## 2017-03-23 MED ORDER — FAMOTIDINE 20 MG PO TABS: 20.0000 mg | ORAL_TABLET | Freq: Two times a day (BID) | ORAL | 0 refills | Status: DC | PRN

## 2017-03-23 MED ORDER — TRAMADOL HCL 50 MG PO TABS: ORAL_TABLET | ORAL | 1 refills | Status: DC

## 2017-03-23 NOTE — Progress Notes (Signed)
Subjective:   Patient ID: Susan Patel, female    DOB: 1940/09/25, 76 y.o.   MRN: [id number] CC: Follow-up (Parkinson's) and Fall (Multiple in past week, broken tailbone, chipped elbow)  HPI: Susan Patel is a 76 y.o. female presenting for Follow-up (Parkinson's) and Fall (Multiple in past week, broken tailbone, chipped elbow)  Was diagnosed with Parkinson's disease 6-8 years ago in [place], [place] Started with hand shaking Still sometimes will shake food off of a fork Was put on a medicine, husband/pt isnt sure what, they say she took it once a day, it was stopped after apprx 6 mo, they arent sure why  Has been displeased with harshness in her voice since the stroke, says speech therapy has told her something might be happening with her vocal cord  Frequent falls: has had 5-6 in the last week Feeling more unbalanced Usually falling backwards, will walk with her walker to place she keeps the walker, then try to back up but not be able to catch herself and fall backwards Has been using walker more at home last few  Was in PT right after the stroke, at that time was not needing walker regularly Falls happen in the morning, during day, at night Husband gets up with her at night when she goes to the bathroom  Insomnia: taking trazodone every night Says it is helping, does not want to stop  Hot flashes: in the past on estrogen, stopped after stroke Started on gabapentin for control with good improvement in symptoms, taking 358m twice a day  Allergic rhinitis: taking OTC equate prn for allergies  Pain: has h/o fibromyalgia, has pain in her legs, arms, back Takes tramadol twice a day  Pt says helps some, keep her up doing ADLs Indication for chronic opioid: chronic pain related to fibromyalgia, arthritis Medication and dose: tramadol 562mtwice a day # pills per month: #60 Last UDS date: due Pain contract signed (Y/N): Y Date narcotic database last reviewed (include red flags):  today, no red flags  Incontinence: takes detrol daily, says it helps with her incontinence symptoms   Relevant past medical, surgical, family and social history reviewed. Allergies and medications reviewed and updated. Social History   Tobacco Use  Smoking Status Never Smoker  Smokeless Tobacco Never Used   ROS: Per HPI   Objective:    BP 134/81   Pulse 71   Temp (!) 97.2 F (36.2 C) (Oral)   Ht _0  (1.549 m)   Wt 200 lb 6.4 oz (90.9 kg)   BMI 37.87 kg/m   Wt Readings from Last 3 Encounters:  03/23/17 200 lb 6.4 oz (90.9 kg)  03/15/17 190 lb (86.2 kg)  03/14/17 204 lb (92.5 kg)    Gen: NAD, alert, cooperative with exam, NCAT EYES: EOMI, no conjunctival injection, or no icterus ENT:   OP without erythema LYMPH: no cervical LAD CV: NRRR, normal S1/S2, no murmur, distal pulses 2+ b/l Resp: CTABL, no wheezes, normal WOB Abd: +BS, soft, NTND. no guarding or organomegaly Ext: No edema, warm Neuro: Alert and oriented, speech dysarthric, gait slightly wide based, shuffling at times. No resting tremor. Psych: slightly flat affect  Assessment & Plan:  Susan Patel seen today for follow-up and multiple falls. I am worried about her polypharmacy and multiple falls. Pt very hesitant to decrease any of her medicines, is in agreement that she wants to minimize falls risk. Husband in agreement with plan as well. [facility]  Discussed never backing up, turning around before walking in another direction Has appt tomorrow with neurology for f/u parkinson's disease, not currently on medications Stop antihistamine Start to decrease gabapentin Will continue to wean medications as able  Diagnoses and all orders for this visit:  Hoarseness -     Ambulatory referral to ENT  Constipation, unspecified constipation type Stable, cont below -     linaclotide (LINZESS) 72 MCG capsule; TAKE 1 CAPSULE (72 MCG TOTAL) BY MOUTH DAILY BEFORE BREAKFAST.  Hot  flashes Weaning off of below -     gabapentin (NEURONTIN) 100 MG capsule; 234m BID x 2 weeks, 102mx2 weeks then stop  Gastroesophageal reflux disease, esophagitis presence not specified Stop PPI Cont below PRN only -     famotidine (PEPCID) 20 MG tablet; Take 1 tablet (20 mg total) 2 (two) times daily as needed by mouth for heartburn or indigestion.  Other chronic pain Take ONLY IF NEEDED and only if helps Goal to stop this medication over next couple of months -     traMADol (ULTRAM) 50 MG tablet; TAKE 1 TABLET EVERY 12 HOURS AS NEEDED FOR MODERATE OR SEVERE PAIN  Diabetes mellitus without complication ([place], pt denies any lows -     CMP14+EGFR -     Bayer DCA Hb A1c Waived  Frequent falls -     [facility]ulatory referral to [facility]     Face-to-face encounter (required for Medicare/Medicaid patients)  Encounter for immunization -     Flu vaccine HIGH DOSE PF  Polypharmacy Will cont to go over medication list, stop medicines as able  Follow up plan: 2 mo, sooner prn CaAssunta FoundMD [facility]

## 2017-03-23 NOTE — Telephone Encounter (Signed)
Attempted to reorder medication, unable to at this time, medication is locked with this pop-up message:  Orders cannot be changed The following medications are ordered in a future encounter, so you cannot change them:  tolterodine (DETROL) 1 MG tablet

## 2017-03-23 NOTE — Patient Instructions (Addendum)
Gabapentin to 200mg  twice a day for 2 weeks, then to 100mg  twice a day for two weeks then stop  Avoid electric throw

## 2017-03-23 NOTE — Telephone Encounter (Signed)
Faxed  Rx [facility] to [facility]

## 2017-03-24 ENCOUNTER — Ambulatory Visit: Payer: Medicare Other | Admitting: Neurology

## 2017-03-24 ENCOUNTER — Encounter: Payer: Self-pay | Admitting: Pediatrics

## 2017-03-24 ENCOUNTER — Encounter: Payer: Self-pay | Admitting: Neurology

## 2017-03-24 VITALS — BP 151/88 | HR 71 | Ht 61.0 in | Wt 202.6 lb

## 2017-03-24 DIAGNOSIS — I6381 Other cerebral infarction due to occlusion or stenosis of small artery: Secondary | ICD-10-CM

## 2017-03-24 DIAGNOSIS — I639 Cerebral infarction, unspecified: Secondary | ICD-10-CM | POA: Diagnosis not present

## 2017-03-24 LAB — CMP14+EGFR
A/G RATIO: 1.5 (ref 1.2–2.2)
ALT: 10 IU/L (ref 0–32)
AST: 11 IU/L (ref 0–40)
Albumin: 4.1 g/dL (ref 3.5–4.8)
Alkaline Phosphatase: 186 IU/L — ABNORMAL HIGH (ref 39–117)
BUN/Creatinine Ratio: 23 (ref 12–28)
BUN: 22 mg/dL (ref 8–27)
Bilirubin Total: 0.3 mg/dL (ref 0.0–1.2)
CALCIUM: 9.3 mg/dL (ref 8.7–10.3)
CO2: 26 mmol/L (ref 20–29)
Chloride: 103 mmol/L (ref 96–106)
Creatinine, Ser: 0.96 mg/dL (ref 0.57–1.00)
GFR, EST AFRICAN AMERICAN: 67 mL/min/{1.73_m2} (ref >59)
GFR, EST NON AFRICAN AMERICAN: 58 mL/min/{1.73_m2} — AB (ref >59)
GLUCOSE: 85 mg/dL (ref 65–99)
Globulin, Total: 2.7 g/dL (ref 1.5–4.5)
Potassium: 5 mmol/L (ref 3.5–5.2)
Sodium: 143 mmol/L (ref 134–144)
TOTAL PROTEIN: 6.8 g/dL (ref 6.0–8.5)

## 2017-03-24 NOTE — Patient Instructions (Addendum)
I had a long d/w patient and her husband about her recent basal ganglia stroke, risk for recurrent stroke/TIAs, personally independently reviewed imaging studies and stroke evaluation results and answered questions.Continue Plavixfor secondary stroke prevention and maintain strict control of hypertension with blood pressure goal below 130/90, diabetes with hemoglobin A1c goal below 6.5% and lipids with LDL cholesterol goal below 70 mg/dL. I also advised the patient to eat a healthy diet with plenty of whole grains, cereals, fruits and vegetables, exercise regularly and maintain ideal body weight .  We also talked about fall risk and prevention precautions and encouraged her to use a walker at all times.  Followup in the future with my nurse practitioner in 6 months or call earlier if necessary.  Fall Prevention in the Home Falls can cause injuries and can affect people from all age groups. There are many simple things that you can do to make your home safe and to help prevent falls. What can I do on the outside of my home?  Regularly repair the edges of walkways and driveways and fix any cracks.  Remove high doorway thresholds.  Trim any shrubbery on the main path into your home.  Use bright outdoor lighting.  Clear walkways of debris and clutter, including tools and rocks.  Regularly check that handrails are securely fastened and in good repair. Both sides of any steps should have handrails.  Install guardrails along the edges of any raised decks or porches.  Have leaves, snow, and ice cleared regularly.  Use sand or salt on walkways during winter months.  In the garage, clean up any spills right away, including grease or oil spills. What can I do in the bathroom?  Use night lights.  Install grab bars by the toilet and in the tub and shower. Do not use towel bars as grab bars.  Use non-skid mats or decals on the floor of the tub or shower.  If you need to sit down while you are in  the shower, use a plastic, non-slip stool.  Keep the floor dry. Immediately clean up any water that spills on the floor.  Remove soap buildup in the tub or shower on a regular basis.  Attach bath mats securely with double-sided non-slip rug tape.  Remove throw rugs and other tripping hazards from the floor. What can I do in the bedroom?  Use night lights.  Make sure that a bedside light is easy to reach.  Do not use oversized bedding that drapes onto the floor.  Have a firm chair that has side arms to use for getting dressed.  Remove throw rugs and other tripping hazards from the floor. What can I do in the kitchen?  Clean up any spills right away.  Avoid walking on wet floors.  Place frequently used items in easy-to-reach places.  If you need to reach for something above you, use a sturdy step stool that has a grab bar.  Keep electrical cables out of the way.  Do not use floor polish or wax that makes floors slippery. If you have to use wax, make sure that it is non-skid floor wax.  Remove throw rugs and other tripping hazards from the floor. What can I do in the stairways?  Do not leave any items on the stairs.  Make sure that there are handrails on both sides of the stairs. Fix handrails that are broken or loose. Make sure that handrails are as long as the stairways.  Check any carpeting to  make sure that it is firmly attached to the stairs. Fix any carpet that is loose or worn.  Avoid having throw rugs at the top or bottom of stairways, or secure the rugs with carpet tape to prevent them from moving.  Make sure that you have a light switch at the top of the stairs and the bottom of the stairs. If you do not have them, have them installed. What are some other fall prevention tips?  Wear closed-toe shoes that fit well and support your feet. Wear shoes that have rubber soles or low heels.  When you use a stepladder, make sure that it is completely opened and that  the sides are firmly locked. Have someone hold the ladder while you are using it. Do not climb a closed stepladder.  Add color or contrast paint or tape to grab bars and handrails in your home. Place contrasting color strips on the first and last steps.  Use mobility aids as needed, such as canes, walkers, scooters, and crutches.  Turn on lights if it is dark. Replace any light bulbs that burn out.  Set up furniture so that there are clear paths. Keep the furniture in the same spot.  Fix any uneven floor surfaces.  Choose a carpet design that does not hide the edge of steps of a stairway.  Be aware of any and all pets.  Review your medicines with your healthcare provider. Some medicines can cause dizziness or changes in blood pressure, which increase your risk of falling. Talk with your health care provider about other ways that you can decrease your risk of falls. This may include working with a physical therapist or trainer to improve your strength, balance, and endurance. This information is not intended to replace advice given to you by your health care provider. Make sure you discuss any questions you have with your health care provider. Document Released: 04/23/2002 Document Revised: 09/30/2015 Document Reviewed: 06/07/2014 Elsevier Interactive Patient Education  2017 [employer].

## 2017-03-24 NOTE — Progress Notes (Signed)
[facility] [address] [place]. [place] [address] [phone]       OFFICE FOLLOW-UP NOTE  [name] Date of Birth:  11/23/1940 Medical Record Number:  [id number]    HPI: 9 year Caucasian lady seen today for the first office follow-up visit following hospital admission for stroke in May 2018.  She is accompanied by her husband.  History is obtained from them as well as review of electronic medical records.  I have personally reviewed imaging films. Susan Mickiewicz Knightis a 76 y.o.femalewith a historyof previous strokewho was in her normalstate of health at bedtime last night 10/08/2016. Her daughter reported that she spoke with her at 10 am and that she was in her normal state of health at that time. She presented to [facility] where she was evaluated by teleneurology [facility]). She received IV tPA 10/09/2016 at 1415. Subsequently she endorsed through head shaking that she was having more difficulty walking even on awakening,but this was after tpa. She was speaking still and apparently normal enough at10 am to be reported as normal. She went to breakfast after this and needed a little more help to get in her chair. She then was having some trouble swallowing and speakingand therefor 911 was called. Dr. Tobias Alexander saw on arrival to [facility]. Her LKW was initially reported as 10am, but he suspects her LKW was 10/08/16 prior to bed. He quesitoned true aphasia vs bulbar dysfunction. Patient was admitted to [facility] for further evaluation and treatment. Patient was admitted to the intensive care unit and blood pressure was tightly controlled. She remained neurologically stable. She was subsequently transferred to the floor and seen by the therapist. She was felt neurologically stable to be transferred to inpatient rehabilitation for continuing ongoing therapies. The results of the studies are pending as follows. CT head - multiple subacute to old infarcts.MRIhead- 1. Acute infarcts in the right  basal ganglia and anterior right parietal white matter..2. Chronic right occipital cortical infarct and extensive chronic small vessel ischemic changes.MRAhead-not performed CTA H&N - Severe, progressive, advanced intracranial atherosclerosisas above.CarotidDoppler- CTA neck.2D Echo-Left ventricle: The cavity size was normal. There was mildconcentric hypertrophy. Systolic function was normal. The estimated ejection fraction was in the range of 60% to 65%. Wall motion was normal; there were no regional wall motionabnormalities. LDL- 90. HgbA1c- 6.3 she was placed on Plavix for stroke prevention and transferred to inpatient rehab as she was for several weeks now she is currently at home.  The patient missed several follow-up appointments.  The husband states that her balance is poor and she tends to fall a lot particularly when she is turning or getting up or sitting down.  She does have a walker and she uses mainly to walk long distances inside her home.  She can walk short distances without it.  Her husband has noticed that her legs are stiff when she walks.  He also describes intermittent tremor and has concerns about Parkinson's disease.  Patient denies any drooling of saliva, resting tremor or gait festination stooped posture.      ROS:   14 system review of systems is positive for joint pain and swelling, cramps, aching muscles, tremor and all other systems negative  PMH:  Past Medical History:  Diagnosis Date  . Arthritis    'all over"  . Asthma   . Chronic pain   . COPD (chronic obstructive pulmonary disease) (HCC)    O2 per [place] at nights   . Depression   . Diabetes  mellitus without complication ([place])    borderline- , states she was on med., but MD told her "everything is under control so I threw the bottle away"  . Fibromyalgia   . GERD (gastroesophageal reflux disease)   . Hypertension   . Neuromuscular disorder (HCC)    parkinson, neuropathy- both feet & hands.  .  Stroke [facility])    residual dysarthria    Social History:  Social History   Socioeconomic History  . Marital status: Married    Spouse name: Not on file  . Number of children: Not on file  . Years of education: Not on file  . Highest education level: Not on file  Social Needs  . Financial resource strain: Not on file  . Food insecurity - worry: Not on file  . Food insecurity - inability: Not on file  . Transportation needs - medical: Not on file  . Transportation needs - non-medical: Not on file  Occupational History  . Not on file  Tobacco Use  . Smoking status: Never Smoker  . Smokeless tobacco: Never Used  Substance and Sexual Activity  . Alcohol use: No  . Drug use: No  . Sexual activity: Yes  Other Topics Concern  . Not on file  Social History Narrative  . Not on file    Medications:   Current Outpatient Medications on File Prior to Visit  Medication Sig Dispense Refill  . albuterol (PROVENTIL HFA;VENTOLIN HFA) 108 (90 Base) MCG/ACT inhaler Inhale 1-2 puffs into the lungs every 6 (six) hours as needed for wheezing or shortness of breath. 1 Inhaler 1  . clopidogrel (PLAVIX) 75 MG tablet Take 1 tablet (75 mg total) by mouth daily. 90 tablet 0  . DULoxetine (CYMBALTA) 30 MG capsule TAKE 2 CAPSULES (60 MG TOTAL) BY MOUTH DAILY. 60 capsule 2  . famotidine (PEPCID) 20 MG tablet Take 1 tablet (20 mg total) 2 (two) times daily as needed by mouth for heartburn or indigestion. 180 tablet 0  . fluticasone (FLONASE) 50 MCG/ACT nasal spray Place 2 sprays into both nostrils daily. (Patient taking differently: Place 2 sprays into both nostrils daily as needed for allergies. ) 54 g 0  . fluticasone furoate-vilanterol (BREO ELLIPTA) 200-25 MCG/INH AEPB Inhale 1 puff into the lungs daily.    Marland Kitchen gabapentin (NEURONTIN) 100 MG capsule 200mg  BID x 2 weeks, 100mg  x2 weeks then stop    . glimepiride (AMARYL) 2 MG tablet TAKE 1 TABLET (2 MG TOTAL) BY MOUTH DAILY BEFORE BREAKFAST. 90 tablet 0  .  latanoprost (XALATAN) 0.005 % ophthalmic solution Place 1 drop into both eyes at bedtime. 2.5 mL 12  . linaclotide (LINZESS) 72 MCG capsule TAKE 1 CAPSULE (72 MCG TOTAL) BY MOUTH DAILY BEFORE BREAKFAST. 90 capsule 0  . losartan (COZAAR) 25 MG tablet Take 0.5 tablets (12.5 mg total) by mouth daily. 30 tablet 0  . montelukast (SINGULAIR) 10 MG tablet TAKE 1 TABLET (10 MG TOTAL) BY MOUTH AT BEDTIME. 30 tablet 9  . OXYGEN Inhale 3-4 L into the lungs at bedtime.    . rosuvastatin (CRESTOR) 40 MG tablet TAKE 1 TABLET (40 MG TOTAL) BY MOUTH EVERY EVENING. 90 tablet 0  . tolterodine (DETROL) 1 MG tablet TAKE 1 TABLET (1 MG TOTAL) BY MOUTH 2 (TWO) TIMES DAILY. 60 tablet 1  . traMADol (ULTRAM) 50 MG tablet TAKE 1 TABLET EVERY 12 HOURS AS NEEDED FOR MODERATE OR SEVERE PAIN 60 tablet 1  . traZODone (DESYREL) 50 MG tablet TAKE 1 TABLET (  50 MG TOTAL) BY MOUTH NIGHTLY AS NEEDED FOR FOR SLEEP. 30 tablet 0  . triamcinolone (KENALOG) 0.025 % ointment Apply 1 application topically 2 (two) times daily. 30 g 1  . Triamcinolone Acetonide (TRIAMCINOLONE 0.1 % CREAM : EUCERIN) CREA Apply 1 application topically 2 (two) times daily. 1 each 0  . Vitamin D, Ergocalciferol, (DRISDOL) 50000 units CAPS capsule Take 50,000 Units by mouth every Monday.   3   No current facility-administered medications on file prior to visit.     Allergies:   Allergies  Allergen Reactions  . Azithromycin Other (See Comments)    Renal failure  . Prednisone Other (See Comments)    Irritability and insomnia  . Sulfamethoxazole-Trimethoprim Other (See Comments)    Renal failure  . Amlodipine     Caused swelling in legs  . Ciprofloxacin Itching and Rash  . Tape Rash    Physical Exam General: well developed, well nourished elderly Caucasian lady, seated, in no evident distress Head: head normocephalic and atraumatic.  Neck: supple with no carotid or supraclavicular bruits Cardiovascular: regular rate and rhythm, no  murmurs Musculoskeletal: no deformity Skin:  no rash/petichiae Vascular:  Normal pulses all extremities Vitals:   03/24/17 1607  BP: (!) 151/88  Pulse: 71   Neurologic Exam Mental Status: Awake and fully alert. Oriented to place and time. Recent and remote memory intact. Attention span, concentration and fund of knowledge appropriate. Mood and affect appropriate.  Cranial Nerves: Fundoscopic exam reveals sharp disc margins. Pupils equal, briskly reactive to light. Extraocular movements full without nystagmus. Visual fields full to confrontation. Hearing intact. Facial sensation intact. Face, tongue, palate moves normally and symmetrically.  Jaw jerk is brisk. Motor: Normal bulk and tone. Normal strength in all tested extremity muscles.  Diminished fine finger movements on the left.  Orbits right over left upper extremity.  Mild left grip weakness.  Sensory.: intact to touch ,pinprick .position and vibratory sensation. Coordination: Rapid alternating movements normal in all extremities. Finger-to-nose and heel-to-shin performed accurately bilaterally. Gait and Station: Arises from chair with right difficulty. Stance is normal. Gait demonstrates normal stride length and mild imbalance .  Broad-based stiff gait with dragging of the left leg.  Unsteady while standing on either foot unsupported. Reflexes: 2+ brisk and symmetric. Toes downgoing.   NIHSS  2 Modified Rankin 3   ASSESSMENT: 29 year patient lady with right basal ganglia infarct in May 2018 with prior history of left basal ganglia and right occipital infarcts with neurological exam suggestive of pseudobulbar state.  Vascular risk factors of diabetes, hypertension and hyperlipidemia    PLAN: I had a long d/w patient and her husband about her recent basal ganglia stroke, risk for recurrent stroke/TIAs, personally independently reviewed imaging studies and stroke evaluation results and answered questions.Continue Plavixfor secondary  stroke prevention and maintain strict control of hypertension with blood pressure goal below 130/90, diabetes with hemoglobin A1c goal below 6.5% and lipids with LDL cholesterol goal below 70 mg/dL. I also advised the patient to eat a healthy diet with plenty of whole grains, cereals, fruits and vegetables, exercise regularly and maintain ideal body weight .  We also talked about fall risk and prevention precautions and encouraged her to use a walker at all times.  Followup in the future with my nurse practitioner in 6 months or call earlier if necessary. Greater than 50% of time during this 25 minute visit was spent on counseling,explanation of diagnosis bilateral infarcts and pseudobulbar state, planning of further management, discussion  with patient and family and coordination of care Antony Contras, MD  [facility] [address] [place], [place] [address]  Phone [phone] Fax [phone] Note: This document was prepared with digital dictation and possible smart phrase technology. Any transcriptional errors that result from this process are unintentional

## 2017-03-28 ENCOUNTER — Other Ambulatory Visit: Payer: Self-pay | Admitting: Pediatrics

## 2017-03-30 ENCOUNTER — Telehealth: Payer: Self-pay | Admitting: *Deleted

## 2017-03-30 NOTE — Telephone Encounter (Signed)
Has not been able to start therapy yet Caregiver does not want them to come before 3pm Tried to go on Tuesday but this had to be rescheduled for th 11/15 will be trying for 3pm again

## 2017-03-31 DIAGNOSIS — G20 Parkinson's disease: Secondary | ICD-10-CM | POA: Diagnosis not present

## 2017-03-31 DIAGNOSIS — I69354 Hemiplegia and hemiparesis following cerebral infarction affecting left non-dominant side: Secondary | ICD-10-CM | POA: Diagnosis not present

## 2017-03-31 DIAGNOSIS — E1140 Type 2 diabetes mellitus with diabetic neuropathy, unspecified: Secondary | ICD-10-CM | POA: Diagnosis not present

## 2017-03-31 DIAGNOSIS — S322XXD Fracture of coccyx, subsequent encounter for fracture with routine healing: Secondary | ICD-10-CM | POA: Diagnosis not present

## 2017-03-31 DIAGNOSIS — M1991 Primary osteoarthritis, unspecified site: Secondary | ICD-10-CM | POA: Diagnosis not present

## 2017-04-01 ENCOUNTER — Telehealth: Payer: Self-pay | Admitting: Pediatrics

## 2017-04-01 NOTE — Telephone Encounter (Signed)
Letter created and placed on providers desk for signature. Pt aware

## 2017-04-04 ENCOUNTER — Other Ambulatory Visit: Payer: Self-pay

## 2017-04-04 DIAGNOSIS — Z8673 Personal history of transient ischemic attack (TIA), and cerebral infarction without residual deficits: Secondary | ICD-10-CM | POA: Insufficient documentation

## 2017-04-04 DIAGNOSIS — J383 Other diseases of vocal cords: Secondary | ICD-10-CM | POA: Insufficient documentation

## 2017-04-04 DIAGNOSIS — R490 Dysphonia: Secondary | ICD-10-CM | POA: Insufficient documentation

## 2017-04-04 DIAGNOSIS — M797 Fibromyalgia: Secondary | ICD-10-CM

## 2017-04-04 MED ORDER — DULOXETINE HCL 30 MG PO CPEP: 60.0000 mg | ORAL_CAPSULE | Freq: Every day | ORAL | 0 refills | Status: DC

## 2017-04-08 ENCOUNTER — Other Ambulatory Visit: Payer: Self-pay | Admitting: Pediatrics

## 2017-04-08 DIAGNOSIS — L309 Dermatitis, unspecified: Secondary | ICD-10-CM

## 2017-04-09 ENCOUNTER — Other Ambulatory Visit: Payer: Self-pay | Admitting: Pediatrics

## 2017-04-12 ENCOUNTER — Ambulatory Visit (INDEPENDENT_AMBULATORY_CARE_PROVIDER_SITE_OTHER): Payer: Medicare Other | Admitting: Orthopaedic Surgery

## 2017-04-14 ENCOUNTER — Ambulatory Visit: Payer: Medicare Other | Admitting: Physical Medicine & Rehabilitation

## 2017-04-14 ENCOUNTER — Encounter: Payer: Medicare Other | Admitting: Physical Medicine & Rehabilitation

## 2017-04-20 ENCOUNTER — Other Ambulatory Visit: Payer: Self-pay | Admitting: Pediatrics

## 2017-04-21 ENCOUNTER — Other Ambulatory Visit: Payer: Self-pay | Admitting: Pediatrics

## 2017-04-21 ENCOUNTER — Other Ambulatory Visit: Payer: Self-pay | Admitting: Physical Medicine & Rehabilitation

## 2017-04-21 ENCOUNTER — Ambulatory Visit: Payer: Medicare Other | Attending: Otolaryngology

## 2017-04-21 DIAGNOSIS — R232 Flushing: Secondary | ICD-10-CM

## 2017-04-22 ENCOUNTER — Other Ambulatory Visit: Payer: Self-pay | Admitting: Family Medicine

## 2017-04-22 DIAGNOSIS — G8929 Other chronic pain: Secondary | ICD-10-CM

## 2017-05-03 ENCOUNTER — Ambulatory Visit (INDEPENDENT_AMBULATORY_CARE_PROVIDER_SITE_OTHER): Payer: Medicare Other | Admitting: Pediatrics

## 2017-05-03 ENCOUNTER — Encounter: Payer: Self-pay | Admitting: Pediatrics

## 2017-05-03 VITALS — BP 131/72 | HR 77 | Temp 96.8°F | Ht 61.0 in | Wt 200.4 lb

## 2017-05-03 DIAGNOSIS — E559 Vitamin D deficiency, unspecified: Secondary | ICD-10-CM

## 2017-05-03 DIAGNOSIS — R238 Other skin changes: Secondary | ICD-10-CM

## 2017-05-03 DIAGNOSIS — R296 Repeated falls: Secondary | ICD-10-CM

## 2017-05-03 DIAGNOSIS — E785 Hyperlipidemia, unspecified: Secondary | ICD-10-CM

## 2017-05-03 DIAGNOSIS — G8929 Other chronic pain: Secondary | ICD-10-CM | POA: Diagnosis not present

## 2017-05-03 MED ORDER — TRAMADOL HCL 50 MG PO TABS: ORAL_TABLET | ORAL | 2 refills | Status: DC

## 2017-05-03 MED ORDER — TRIAMCINOLONE 0.1 % CREAM:EUCERIN CREAM 1:1: 1.0000 "application " | TOPICAL_CREAM | Freq: Two times a day (BID) | CUTANEOUS | 1 refills | Status: DC

## 2017-05-03 NOTE — Progress Notes (Signed)
Subjective:   Patient ID: Susan Patel, female    DOB: [birth year], 76 y.o.   MRN: [id number] CC: Follow-up  HPI: Susan Patel is a 76 y.o. female presenting for Follow-up  Here today with her husband  Chronic pain: Indication for chronic opioid: Has arthritis bilateral knees, hips.  Ongoing fibromyalgia pains, aching in her lower legs Has been out of tramadol for about a week due to her visit to [facility] Pain has been more limiting due to that Medication and dose: Tramadol 50 mg, TID prn # pills per month: 90 Last UDS date: due today Pain contract signed (Y/N): Y Date narcotic database last reviewed (include red flags): 12/18, no red flags  The pain medicine does help keep her up and moving She does not think that it increases her dizziness or falls She has had several falls since last visit, primarily with walking backwards Has also tripped over her walker once, tripped over other things that her feet that should not be there for her husband Husband has noticed that she takes small steps going backwards when she is not thinking about it, she still in physical therapy, they come out to their home With the therapist she takes much larger steps moving backwards with prompting and it helps keep her more steady on her feet Last bone density scan 1 year ago, normal  Has not yet started vocal therapy for voice changes  Vit D def: Asks about vitamin D refill, due for repeat labs, likely able to switch to over-the-counter vitamin D  Hyperlipidemia: Taking Crestor regularly  Has had more skin irritation over the last week, says it happens more when she gets stressed Lots of family around, had 72 family members to eat over this past weekend  Relevant past medical, surgical, family and social history reviewed. Allergies and medications reviewed and updated. Social History   Tobacco Use  Smoking Status Never Smoker  Smokeless Tobacco Never Used   ROS: Per HPI   Objective:    BP 131/72   Pulse 77   Temp (!) 96.8 F (36 C) (Oral)   Ht 5\' 1"  (1.549 m)   Wt 200 lb 6.4 oz (90.9 kg)   BMI 37.87 kg/m   Wt Readings from Last 3 Encounters:  05/03/17 200 lb 6.4 oz (90.9 kg)  03/24/17 202 lb 9.6 oz (91.9 kg)  03/23/17 200 lb 6.4 oz (90.9 kg)    Gen: NAD, alert, cooperative with exam, NCAT EYES: EOMI, no conjunctival injection, or no icterus ENT:  OP without erythema LYMPH: no cervical LAD CV: NRRR, normal S1/S2, no murmur, distal pulses 2+ b/l Resp: CTABL, no wheezes, normal WOB Abd: +BS, soft, NTND.  Ext: No edema, warm, wearing 2.5 inch heels Neuro: Alert and oriented, strength equal b/l UE and LE, coordination grossly normal, speech dysarthric Skin: No rash bilateral arms, legs  Assessment & Plan:  Susan Patel was seen today for follow-up multiple medical problems  Diagnoses and all orders for this visit:  Hyperlipidemia, unspecified hyperlipidemia type On crestor, cont -     Lipid panel  Other chronic pain Continue below, does seem to help keep her mobile, more active Does not make her sleepy or increased falls per patient and her husband Will continue to assess need, decrease when able #90 tabs with 2 refills given -     traMADol (ULTRAM) 50 MG tablet; TAKE 1 TABLET EVERY 12 HOURS AS NEEDED FOR MODERATE OR SEVERE PAIN -     ToxASSURE Select  13 (MW), Urine  Skin irritation Use thick OTC moisturizer daily, use below as needed for irritated skin -     Triamcinolone Acetonide (TRIAMCINOLONE 0.1 % CREAM : EUCERIN) CREA; Apply 1 application topically 2 (two) times daily.  Vitamin D deficiency Recheck level, cont OTC vit D -     VITAMIN D 25 Hydroxy (Vit-D Deficiency, Fractures)  Frequent falls Continue physical therapy, discussed wearing more stable shoes regularly Use walker all the time, avoid backing up as needs prompting when backing up to do it appropropriately  Follow up plan: Return in about 3 months (around 07/18/2017). Assunta Found,  MD [place]

## 2017-05-04 LAB — VITAMIN D 25 HYDROXY (VIT D DEFICIENCY, FRACTURES): VIT D 25 HYDROXY: 35.8 ng/mL (ref 30.0–100.0)

## 2017-05-04 LAB — LIPID PANEL
CHOL/HDL RATIO: 4.3 ratio (ref 0.0–4.4)
Cholesterol, Total: 255 mg/dL — ABNORMAL HIGH (ref 100–199)
HDL: 59 mg/dL (ref >39)
LDL Calculated: 163 mg/dL — ABNORMAL HIGH (ref 0–99)
Triglycerides: 165 mg/dL — ABNORMAL HIGH (ref 0–149)
VLDL CHOLESTEROL CAL: 33 mg/dL (ref 5–40)

## 2017-05-04 MED ORDER — ROSUVASTATIN CALCIUM 40 MG PO TABS: 40.0000 mg | ORAL_TABLET | Freq: Every evening | ORAL | 1 refills | Status: DC

## 2017-05-04 NOTE — Addendum Note (Signed)
Addended by: Eustaquio Maize on: 05/04/2017 01:31 PM   Modules accepted: Orders

## 2017-05-12 LAB — TOXASSURE SELECT 13 (MW), URINE

## 2017-05-18 ENCOUNTER — Other Ambulatory Visit: Payer: Self-pay

## 2017-05-18 ENCOUNTER — Other Ambulatory Visit: Payer: Self-pay | Admitting: Pediatrics

## 2017-05-18 DIAGNOSIS — J209 Acute bronchitis, unspecified: Secondary | ICD-10-CM

## 2017-05-18 MED ORDER — FLUTICASONE FUROATE-VILANTEROL 200-25 MCG/INH IN AEPB: 1.0000 | INHALATION_SPRAY | Freq: Every day | RESPIRATORY_TRACT | 4 refills | Status: DC

## 2017-05-18 MED ORDER — FLUTICASONE PROPIONATE 50 MCG/ACT NA SUSP: 2.0000 | Freq: Every day | NASAL | 4 refills | Status: DC

## 2017-05-19 ENCOUNTER — Ambulatory Visit: Payer: Medicare HMO | Admitting: Speech Pathology

## 2017-05-19 DIAGNOSIS — M1991 Primary osteoarthritis, unspecified site: Secondary | ICD-10-CM | POA: Diagnosis not present

## 2017-05-19 DIAGNOSIS — E1140 Type 2 diabetes mellitus with diabetic neuropathy, unspecified: Secondary | ICD-10-CM | POA: Diagnosis not present

## 2017-05-19 DIAGNOSIS — I69354 Hemiplegia and hemiparesis following cerebral infarction affecting left non-dominant side: Secondary | ICD-10-CM | POA: Diagnosis not present

## 2017-05-19 DIAGNOSIS — G20 Parkinson's disease: Secondary | ICD-10-CM | POA: Diagnosis not present

## 2017-05-19 DIAGNOSIS — S322XXD Fracture of coccyx, subsequent encounter for fracture with routine healing: Secondary | ICD-10-CM | POA: Diagnosis not present

## 2017-05-20 DIAGNOSIS — S322XXD Fracture of coccyx, subsequent encounter for fracture with routine healing: Secondary | ICD-10-CM | POA: Diagnosis not present

## 2017-05-20 DIAGNOSIS — G20 Parkinson's disease: Secondary | ICD-10-CM | POA: Diagnosis not present

## 2017-05-20 DIAGNOSIS — I69354 Hemiplegia and hemiparesis following cerebral infarction affecting left non-dominant side: Secondary | ICD-10-CM | POA: Diagnosis not present

## 2017-05-20 DIAGNOSIS — M1991 Primary osteoarthritis, unspecified site: Secondary | ICD-10-CM | POA: Diagnosis not present

## 2017-05-20 DIAGNOSIS — E1140 Type 2 diabetes mellitus with diabetic neuropathy, unspecified: Secondary | ICD-10-CM | POA: Diagnosis not present

## 2017-05-27 ENCOUNTER — Other Ambulatory Visit: Payer: Self-pay | Admitting: *Deleted

## 2017-05-27 MED ORDER — TRAZODONE HCL 50 MG PO TABS: ORAL_TABLET | ORAL | 1 refills | Status: DC

## 2017-05-30 ENCOUNTER — Telehealth: Payer: Self-pay | Admitting: Pediatrics

## 2017-05-31 MED ORDER — OLMESARTAN MEDOXOMIL 5 MG PO TABS: 5.0000 mg | ORAL_TABLET | Freq: Every day | ORAL | 2 refills | Status: DC

## 2017-05-31 NOTE — Telephone Encounter (Signed)
I sent in olmesartan low dose tab. I'd like her to check BP at home, if regularly over 140 on top or >90 on bottom let me know. We may need to increase the dose. Stop losartan.

## 2017-05-31 NOTE — Telephone Encounter (Signed)
Susan Patel is aware that new medication has been sent in and instruction to call if needed.

## 2017-06-02 ENCOUNTER — Other Ambulatory Visit: Payer: Self-pay | Admitting: *Deleted

## 2017-06-02 MED ORDER — OLMESARTAN MEDOXOMIL 5 MG PO TABS: 5.0000 mg | ORAL_TABLET | Freq: Every day | ORAL | 0 refills | Status: DC

## 2017-06-05 ENCOUNTER — Other Ambulatory Visit: Payer: Self-pay | Admitting: Pediatrics

## 2017-06-06 ENCOUNTER — Telehealth: Payer: Self-pay | Admitting: Pediatrics

## 2017-06-06 MED ORDER — LISINOPRIL 10 MG PO TABS: 10.0000 mg | ORAL_TABLET | Freq: Every day | ORAL | 3 refills | Status: DC

## 2017-06-06 NOTE — Telephone Encounter (Signed)
Pt aware.

## 2017-06-06 NOTE — Telephone Encounter (Signed)
I sent in lisinopril to replace the olmesartan.

## 2017-06-09 ENCOUNTER — Ambulatory Visit: Payer: Medicare HMO | Attending: Otolaryngology

## 2017-06-09 ENCOUNTER — Other Ambulatory Visit: Payer: Self-pay

## 2017-06-09 DIAGNOSIS — I69822 Dysarthria following other cerebrovascular disease: Secondary | ICD-10-CM | POA: Insufficient documentation

## 2017-06-09 DIAGNOSIS — R490 Dysphonia: Secondary | ICD-10-CM | POA: Diagnosis not present

## 2017-06-09 DIAGNOSIS — R41841 Cognitive communication deficit: Secondary | ICD-10-CM

## 2017-06-10 NOTE — Therapy (Signed)
[facility] [address] [place], [place], [address] Phone: [phone]   Fax:  [phone]  Speech Language Pathology Evaluation  Patient Details  Name: Susan Patel MRN: [id number] Date of Birth: 1940/08/08 Referring Provider: Lind Guest, MD   Encounter Date: 06/09/2017  End of Session - 06/10/17 0754    Visit Number  1    Number of Visits  17    Date for SLP Re-Evaluation  08/12/17    SLP Start Time  5277    SLP Stop Time   1620    SLP Time Calculation (min)  45 min    Activity Tolerance  Patient tolerated treatment well pt with intermittent decr selective attention       Past Medical History:  Diagnosis Date  . Arthritis    'all over"  . Asthma   . Chronic pain   . COPD (chronic obstructive pulmonary disease) (HCC)    O2 per [place] at nights   . Depression   . Diabetes mellitus without complication ([place])    borderline- , states she was on med., but MD told her "everything is under control so I threw the bottle away"  . Fibromyalgia   . GERD (gastroesophageal reflux disease)   . Hypertension   . Neuromuscular disorder (HCC)    parkinson, neuropathy- both feet & hands.  . Stroke [facility])    residual dysarthria    Past Surgical History:  Procedure Laterality Date  . ABDOMINAL HYSTERECTOMY    . ANTERIOR CERVICAL DECOMP/DISCECTOMY FUSION N/A 07/10/2015   Procedure: Cervical five-six, Cervical six-seven anterior cervical decompression with fusion plating and bonegraft;  Surgeon: Jovita Gamma, MD;  Location: [facility];  Service: Neurosurgery;  Laterality: N/A;  . APPENDECTOMY    . BIOPSY EYE MUSCLE  03/20/2016   biopsy vessel to right eye due to swelling  . EYE SURGERY Bilateral    cataracts removed, /w "cyrstal lenses"   . SHOULDER ARTHROSCOPY Right    x2   RCR- spurs removed     There were no vitals filed for this visit.  Subjective Assessment - 06/09/17 1547    Subjective  Pt with hx CVA  spring-summer 2018 with voice disorder arrives with husband today for therapy.     Patient is accompained by:  Family member Husband    Currently in Pain?  Yes "I have neuropathy"    Pain Score  8     Pain Location  Foot    Pain Orientation  Right;Left    Pain Descriptors / Indicators  Constant    Pain Type  Neuropathic pain    Pain Onset  More than a month ago    Pain Frequency  Constant    Aggravating Factors   cold weather    Pain Relieving Factors  meds    Effect of Pain on Daily Activities  difficult to move around         SLP Evaluation [facility] - 06/09/17 1547      SLP Visit Information   SLP Received On  06/09/17    Referring Provider  Lind Guest, MD    Onset Date  November 2017    Medical Diagnosis  Dysphonia, hx of CVA x2      General Information   HPI  CVA November 2017 resulted in hoarse voice, which improved but not baseline. CVA in May 2018 then resulted in worse voice, which has not really improved. OP ST (4-5 sessions) over two months focused  mostly on dysarthria during that short time.       Balance Screen   Has the patient fallen in the past 6 months  Yes    How many times?  6-10    Has the patient had a decrease in activity level because of a fear of falling?   Yes    Is the patient reluctant to leave their home because of a fear of falling?   Yes      Prior Functional Status   Cognitive/Linguistic Baseline  Within functional limits    Type of Home  House     Lives With  Spouse    Available Support  Family      Cognition   Overall Cognitive Status  Impaired/Different from baseline    Area of Impairment  Memory;Attention    Current Attention Level  Sustained    Attention Comments  Pt decr'd attention with handout re: GERD - needed cues for focus on SLP    Memory  Decreased short-term memory    Memory Comments  Pt req'd husband for some history    Attention  --      Oral Motor/Sensory Function   Overall Oral Motor/Sensory Function  Impaired     Labial ROM  Reduced right;Reduced left    Labial Coordination  Reduced    Lingual ROM  Reduced left    Lingual Symmetry  -- tongue curls upward    Lingual Strength  Reduced Left    Lingual Coordination  Reduced      Motor Speech   Overall Motor Speech  Impaired    Respiration  Impaired    Level of Impairment  Word    Phonation  Aphonic;Low vocal intensity strained strangled quality/hyperkinetic dysarthria?    Intelligibility  Intelligibility reduced    Sentence  75-100% accurate    Conversation  75-100% accurate      Pt's sustained /a/ was measured at average 8.5 seconds, or lower than WNL. Pt exhibited varying degree of strained-strangled quality during sustained /a/ measurements, not unlike that of a hyperkinetic dysarthria. This dysphonia was thought to compromise pt's true performance with this task. In speaking tasks and conversation during eval, approx 60% of pt's utterances had some degree of strained-strangled quality to them, which varied in severity throughout the utterance, or between utterances. There was an aphonic component to pt's dysphonia in approx 20% of utterances.  Pt filled out a Voice-Related QOL form and raw score was tabulated as 37/50, roughly translating to poor-fair QOL based upon pt's voice.                SLP Education - 06/10/17 0753    Education provided  Yes    Education Details  eval results, possible goals, possible neurological component to voice disorder, 4 weeks of trial voice therapy and cont if noted progress    Person(s) Educated  Patient    Methods  Explanation    Comprehension  Verbalized understanding         SLP Long Term Goals - 06/10/17 1057      SLP LONG TERM GOAL #1   Title  pt will have score <37 on the Voice Related QOL (VRQOL)    Time  4    Period  Weeks or 8 visits    Status  New      SLP LONG TERM GOAL #2   Title  pt will demo abdominal breathing 50% of the time at rest    Time  4    Period  Weeks or 8  visits    Status  New      SLP LONG TERM GOAL #3   Title  pt will demo more controlled voice using voice compensations in consonant-vowel combinations 80% of the time over two sessions    Time  4    Period  Weeks or 8 visits    Status  New       Plan - 06/10/17 0755    Clinical Impression Statement  Pt is a64 y.o. 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 MRI also ID'd mod cerebral atrophy and chronic microvascular ischemia in the pons. According to husband pt's voice improved but was "definitely not back to what it was before the stroke" when pt unfortunately suffered a second CVA including the left basal ganglia (lentiform nucleus) and also the rt basal ganglia (lentiform nucleus). Husband stated pt's voice quality worsened then and has only made minimal improvement since that time. There is a possibility that pt's voice disorder is neurological in basis. She has min hoarseness and what seems to be intermittent vocal tightness/tension with aphonia. Pt would beneift from a trial of voice therapy using techniques to attempt to relax pt's voice. She was able to speak in 4-5 word utterances    Speech Therapy Frequency  2x / week    Duration  4 weeks or 8 visits    Treatment/Interventions  SLP instruction and feedback;Compensatory strategies;Functional tasks;Internal/external aids;Patient/family education;Multimodal communcation approach    Potential to Achieve Goals  Fair    Potential Considerations  Ability to learn/carryover information;Severity of impairments    Consulted and Agree with Plan of Care  Patient;Family member/caregiver    Family Member Consulted  husband       Patient will benefit from skilled therapeutic intervention in order to improve the following deficits and impairments:   Dysphonia  Dysarthria following other cerebrovascular disease  Cognitive  communication deficit  G-Codes - 06-25-17 1435    Functional Assessment Tool Used  noms    Functional Limitations  Voice    Voice Current Status (G9171)  At least 60 percent but less than 80 percent impaired, limited or restricted    Voice Goal Status (G9172)  At least 60 percent but less than 80 percent impaired, limited or restricted       Problem List Patient Active Problem List   Diagnosis Date Noted  . 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 History of stroke 04/04/2017  . 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 Contusion of left elbow 03/15/2017  . Dysmetria   . Diabetes mellitus type 2 in nonobese (HCC)   . Supplemental oxygen dependent   . Benign essential HTN   . Hypokalemia   . Hypoalbuminemia due to protein-calorie malnutrition ([place])   . Embolic stroke of right basal ganglia ([place]) 10/12/2016  . Oropharyngeal dysphagia   . Stroke ([place]) 10/09/2016  . Stroke (cerebrum) ([place]) 10/09/2016  . 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 Frequent falls 04/21/2016  . 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 Type 2 diabetes mellitus with peripheral neuropathy (HCC)   . Fibromyalgia   . Chronic pain syndrome   . Chronic obstructive pulmonary disease ([place])   . Gait disturbance, post-stroke   . Dysarthria, post-stroke   . Dysphagia, post-stroke   .  AKI (acute kidney injury) ([place])   . Acute blood loss anemia   . Cerebrovascular accident (CVA) due to occlusion of basilar artery ([place])   . Mixed hyperlipidemia   . 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  . Allergic rhinitis 12/17/2015  . Primary osteoarthritis of both knees 11/19/2015  . Obesity (BMI 30-39.9) 09/04/2015  . Essential hypertension 09/04/2015  . Constipation 09/04/2015  . Asthma 08/26/2015  . Raynaud's phenomenon 08/26/2015  . Gastro-esophageal reflux disease with esophagitis 08/26/2015  . Spinal stenosis 08/26/2015  . Vitamin D deficiency 08/26/2015   . Diabetes mellitus, type 2 ([place]) 08/22/2015  . DDD (degenerative disc disease), cervical 07/10/2015  . Cervical radicular pain 04/30/2013  . Knee osteoarthritis 01/05/2012    [facility] ,MS, CCC-SLP  06/10/2017, 2:48 PM  [facility] [address] [place], [place], [address] Phone: [phone]   Fax:  [phone]  Name: Susan Patel MRN: [id number] Date of Birth: 1941/02/02

## 2017-06-10 NOTE — Patient Instructions (Signed)
Practice breathing with your abdomen instead of your chest and shoulders, 5 minutes, 4-5 times a day.

## 2017-06-11 ENCOUNTER — Other Ambulatory Visit: Payer: Self-pay | Admitting: Pediatrics

## 2017-06-13 ENCOUNTER — Other Ambulatory Visit: Payer: Self-pay

## 2017-06-13 MED ORDER — TOLTERODINE TARTRATE 1 MG PO TABS: 1.0000 mg | ORAL_TABLET | Freq: Two times a day (BID) | ORAL | 0 refills | Status: AC

## 2017-06-17 ENCOUNTER — Ambulatory Visit: Payer: Medicare HMO

## 2017-06-17 MED ORDER — LISINOPRIL 20 MG PO TABS: 20.0000 mg | ORAL_TABLET | Freq: Every day | ORAL | 1 refills | Status: DC

## 2017-06-17 NOTE — Telephone Encounter (Signed)
I talked with husband, she is going to take 20mg  daily, call back with blood pressures Monday. Can you change her medicine list to lisinopril 20mg  and send lisinopril 20mg  in to pharmacy [id number] with 1 refills and remove lisinopril 10mg  tabs from list.

## 2017-06-17 NOTE — Telephone Encounter (Signed)
On lisinopril - BP is running:  180/102 182/82 Several readings are over 90 on the bottom.

## 2017-06-17 NOTE — Telephone Encounter (Signed)
Spouse is calling to let Susan Patel know that lisinopril (PRINIVIL,ZESTRIL) 10 MG tablet is not keeping pt bp down.

## 2017-06-20 ENCOUNTER — Ambulatory Visit (INDEPENDENT_AMBULATORY_CARE_PROVIDER_SITE_OTHER): Payer: Medicare HMO | Admitting: Nurse Practitioner

## 2017-06-20 ENCOUNTER — Encounter: Payer: Self-pay | Admitting: Nurse Practitioner

## 2017-06-20 VITALS — BP 177/81 | HR 74 | Temp 97.5°F | Ht 61.0 in | Wt 201.0 lb

## 2017-06-20 DIAGNOSIS — J069 Acute upper respiratory infection, unspecified: Secondary | ICD-10-CM

## 2017-06-20 MED ORDER — BENZONATATE 100 MG PO CAPS: 100.0000 mg | ORAL_CAPSULE | Freq: Three times a day (TID) | ORAL | 0 refills | Status: DC | PRN

## 2017-06-20 MED ORDER — DOXYCYCLINE HYCLATE 100 MG PO TABS: 100.0000 mg | ORAL_TABLET | Freq: Two times a day (BID) | ORAL | 0 refills | Status: DC

## 2017-06-20 NOTE — Patient Instructions (Signed)

## 2017-06-20 NOTE — Progress Notes (Signed)
   Subjective:    Patient ID: Susan Patel, female    DOB: Apr 15, 1941, 77 y.o.   MRN: [id number]  HPI patient comes in today with her husband. sheis c/o coughing and wheezing. Some congestion. Started 7 days ago and is no better. No OTC meds.    Review of Systems  Constitutional: Negative for appetite change, chills and fever.  HENT: Positive for congestion and rhinorrhea. Negative for sinus pressure and sore throat.   Respiratory: Positive for cough. Choking: productive- thick white.   Cardiovascular: Negative.   Gastrointestinal: Negative.   Neurological: Negative.   Psychiatric/Behavioral: Negative.   All other systems reviewed and are negative.      Objective:   Physical Exam  Constitutional: She appears well-developed and well-nourished. No distress.  HENT:  Right Ear: Hearing, tympanic membrane, external ear and ear canal normal.  Left Ear: Hearing, tympanic membrane, external ear and ear canal normal.  Nose: Mucosal edema and rhinorrhea present. Right sinus exhibits no maxillary sinus tenderness and no frontal sinus tenderness. Left sinus exhibits no maxillary sinus tenderness and no frontal sinus tenderness.  Mouth/Throat: Uvula is midline, oropharynx is clear and moist and mucous membranes are normal.  Neck: Normal range of motion. Neck supple.  Cardiovascular: Normal rate and regular rhythm.  Pulmonary/Chest: Effort normal. She has wheezes (exp in lower basis).  Abdominal: Soft. Bowel sounds are normal.  Neurological: She is alert.  Skin: Skin is warm.  Psychiatric: She has a normal mood and affect. Her behavior is normal. Judgment and thought content normal.   BP (!) 177/81   Pulse 74   Temp (!) 97.5 F (36.4 C) (Oral)   Ht 5\' 1"  (1.549 m)   Wt 201 lb (91.2 kg)   BMI 37.98 kg/m      Assessment & Plan:  1. Upper respiratory infection with cough and congestion  1. Take meds as prescribed 2. Use a cool mist humidifier especially during the winter months and  when heat has been humid. 3. Use saline nose sprays frequently 4. Saline irrigations of the nose can be very helpful if done frequently.  * 4X daily for 1 week*  * Use of a nettie pot can be helpful with this. Follow directions with this* 5. Drink plenty of fluids 6. Keep thermostat turn down low 7.For any cough or congestion  Use plain Mucinex- regular strength or max strength is fine   * Children- consult with Pharmacist for dosing 8. For fever or aces or pains- take tylenol or ibuprofen appropriate for age and weight.  * for fevers greater than 101 orally you may alternate ibuprofen and tylenol every  3 hours.    - doxycycline (VIBRA-TABS) 100 MG tablet; Take 1 tablet (100 mg total) by mouth 2 (two) times daily. 1 po bid  Dispense: 20 tablet; Refill: 0 - benzonatate (TESSALON PERLES) 100 MG capsule; Take 1 capsule (100 mg total) by mouth 3 (three) times daily as needed for cough.  Dispense: 20 capsule; Refill: 0  Mary-Margaret Hassell Done, FNP

## 2017-06-25 DIAGNOSIS — E119 Type 2 diabetes mellitus without complications: Secondary | ICD-10-CM | POA: Diagnosis not present

## 2017-06-25 DIAGNOSIS — W19XXXA Unspecified fall, initial encounter: Secondary | ICD-10-CM | POA: Diagnosis not present

## 2017-06-25 DIAGNOSIS — Z7984 Long term (current) use of oral hypoglycemic drugs: Secondary | ICD-10-CM | POA: Diagnosis not present

## 2017-06-25 DIAGNOSIS — Z881 Allergy status to other antibiotic agents status: Secondary | ICD-10-CM | POA: Diagnosis not present

## 2017-06-25 DIAGNOSIS — S0003XA Contusion of scalp, initial encounter: Secondary | ICD-10-CM | POA: Diagnosis not present

## 2017-06-25 DIAGNOSIS — I69928 Other speech and language deficits following unspecified cerebrovascular disease: Secondary | ICD-10-CM | POA: Diagnosis not present

## 2017-06-25 DIAGNOSIS — Z7901 Long term (current) use of anticoagulants: Secondary | ICD-10-CM | POA: Diagnosis not present

## 2017-06-25 DIAGNOSIS — R079 Chest pain, unspecified: Secondary | ICD-10-CM | POA: Diagnosis not present

## 2017-06-30 ENCOUNTER — Other Ambulatory Visit: Payer: Self-pay | Admitting: *Deleted

## 2017-06-30 MED ORDER — MONTELUKAST SODIUM 10 MG PO TABS: 10.0000 mg | ORAL_TABLET | Freq: Every day | ORAL | 0 refills | Status: DC

## 2017-07-01 ENCOUNTER — Ambulatory Visit: Payer: Medicare HMO | Attending: Otolaryngology

## 2017-07-01 DIAGNOSIS — R490 Dysphonia: Secondary | ICD-10-CM | POA: Diagnosis not present

## 2017-07-01 DIAGNOSIS — R1312 Dysphagia, oropharyngeal phase: Secondary | ICD-10-CM

## 2017-07-01 DIAGNOSIS — I69822 Dysarthria following other cerebrovascular disease: Secondary | ICD-10-CM | POA: Diagnosis not present

## 2017-07-01 DIAGNOSIS — R41841 Cognitive communication deficit: Secondary | ICD-10-CM | POA: Diagnosis not present

## 2017-07-01 NOTE — Therapy (Signed)
[facility] [address] [place], [place], [address] Phone: [phone]   Fax:  [phone]  Speech Language Pathology Treatment  Patient Details  Name: Susan Patel MRN: [id number] Date of Birth: [birth year] Referring Provider: Lind Guest, MD   Encounter Date: 07/01/2017  End of Session - 07/01/17 1656    Visit Number  2    Number of Visits  17    Date for SLP Re-Evaluation  08/12/17    SLP Start Time  9937    SLP Stop Time   1696    SLP Time Calculation (min)  40 min    Activity Tolerance  Patient tolerated treatment well       Past Medical History:  Diagnosis Date  . Arthritis    'all over"  . Asthma   . Chronic pain   . COPD (chronic obstructive pulmonary disease) (HCC)    O2 per [place] at nights   . Depression   . Diabetes mellitus without complication ([place])    borderline- , states she was on med., but MD told her "everything is under control so I threw the bottle away"  . Fibromyalgia   . GERD (gastroesophageal reflux disease)   . Hypertension   . Neuromuscular disorder (HCC)    parkinson, neuropathy- both feet & hands.  . 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 ABDOMINAL HYSTERECTOMY    . ANTERIOR CERVICAL DECOMP/DISCECTOMY FUSION N/A 07/10/2015   Procedure: Cervical five-six, Cervical six-seven anterior cervical decompression with fusion plating and bonegraft;  Surgeon: Jovita Gamma, MD;  Location: [facility];  Service: Neurosurgery;  Laterality: N/A;  . APPENDECTOMY    . BIOPSY EYE MUSCLE  03/20/2016   biopsy vessel to right eye due to swelling  . EYE SURGERY Bilateral    cataracts removed, /w "cyrstal lenses"   . SHOULDER ARTHROSCOPY Right    x2   RCR- spurs removed     There were no vitals filed for this visit.  Subjective Assessment - 07/01/17 1539    Subjective  "I had ron-chitis - I had bronchitis."    Patient is  accompained by:  Family member husband    Currently in Pain?  No/denies            ADULT SLP TREATMENT - 07/01/17 1540      General Information   Behavior/Cognition  Cooperative;Pleasant mood      Treatment Provided   Treatment provided  Cognitive-Linquistic      Cognitive-Linquistic Treatment   Treatment focused on  Voice    Skilled Treatment  Pt told SLP there has been no change to her voice since last session. Due to pt's strained/strangled voice, SLP attempted to work with pt with vocally relaxing techniques such as lo-hi-lo pitch ("siren" exercise), hi to lo ("waterfall" exercise), and breathy "whoooo" exercise with nasal consonants and low back vowel /u/. Pt with more success nearer the end of session (after approx 25 minutes). Initially, SLP noted pt with vocal arrest approx 25% of the attempts, decr'd to 10% by end of trials. SLP used vixual biofeedback with pt with incr'd success for wider pitch range from pt. SLP taught pt abdominal breathing (AB) basics with explanation why SLP recommending it's use; pt with EXTREMELY shallow breathing but pt was utilizting AB albeit very shallow breaths, success with AB at rest approx 60%. SLP attempted to improve pt's breathing cycle for incr'd airflow (pt  states she yawns very frequently throughout the day) with varied success.      Assessment / Recommendations / Plan   Plan  Continue with current plan of care      Progression Toward Goals   Progression toward goals  Progressing toward goals       SLP Education - 07/01/17 1656    Education provided  Yes    Education Details  abdominal breathing, HEP for vocal relaxation    Person(s) Educated  Patient spouse present but not indicating active engagement with tx session   spouse present but not indicating active engagement with tx session   Methods  Explanation;Demonstration;Verbal cues    Comprehension  Verbalized understanding;Returned demonstration;Need further instruction;Verbal  cues required         SLP Long Term Goals - 07/01/17 1658      SLP LONG TERM GOAL #1   Title  pt will have score <37 on the Voice Related QOL (VRQOL)    Time  4    Period  Weeks or 8 visits    Status  On-going      SLP LONG TERM GOAL #2   Title  pt will demo abdominal breathing 50% of the time at rest over three sessions    Time  4    Period  Weeks or 8 visits    Status  Revised      SLP LONG TERM GOAL #3   Title  pt will demo more controlled voice using voice compensations in consonant-vowel combinations 80% of the time over two sessions    Time  4    Period  Weeks or 8 visits    Status  On-going       Plan - 07/01/17 1657    Clinical Impression Statement  Pt with some success today in vocal relaxation strategies/techniques. SLP taught pt abdominal breathing (AB) basics today - need to practice more at rest at next session so pt can begin to practice at home. PT with very shallow breaths but was successful with AB approx 60%. Look at "skilled interventions" for more details. Pt would cont to benefit from skilled ST to improve pt's vocal quality.    Speech Therapy Frequency  2x / week    Duration  4 weeks or 8 visits    Treatment/Interventions  SLP instruction and feedback;Compensatory strategies;Functional tasks;Internal/external aids;Patient/family education;Multimodal communcation approach    Potential to Achieve Goals  Fair    Potential Considerations  Ability to learn/carryover information;Severity of impairments    Consulted and Agree with Plan of Care  Patient;Family member/caregiver    Family Member Consulted  husband       Patient will benefit from skilled therapeutic intervention in order to improve the following deficits and impairments:   Dysphonia  Dysarthria following other cerebrovascular disease  Cognitive communication deficit  Dysphagia, oropharyngeal phase    Problem List Patient Active Problem List   Diagnosis Date Noted  . Age-related vocal  fold atrophy 04/04/2017  . History of stroke 04/04/2017  . 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 Contusion of left elbow 03/15/2017  . Dysmetria   . Diabetes mellitus type 2 in nonobese (HCC)   . Supplemental oxygen dependent   . Benign essential HTN   . Hypokalemia   . Hypoalbuminemia due to protein-calorie malnutrition ([place])   . Embolic stroke of right basal ganglia ([place]) 10/12/2016  . Oropharyngeal dysphagia   . Stroke ([place]) 10/09/2016  . Stroke (cerebrum) ([place]) 10/09/2016  . Cognitive deficit  due to recent stroke 04/23/2016  . Frequent falls 04/21/2016  . 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 Type 2 diabetes mellitus with peripheral neuropathy (HCC)   . Fibromyalgia   . Chronic pain syndrome   . Chronic obstructive pulmonary disease ([place])   . Gait disturbance, post-stroke   . Dysarthria, post-stroke   . Dysphagia, post-stroke   . AKI (acute kidney injury) ([place])   . Acute blood loss anemia   . 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 Mixed hyperlipidemia   . 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  . Allergic rhinitis 12/17/2015  . Primary osteoarthritis of both knees 11/19/2015  . Obesity (BMI 30-39.9) 09/04/2015  . Essential hypertension 09/04/2015  . Constipation 09/04/2015  . Asthma 08/26/2015  . Raynaud's phenomenon 08/26/2015  . Gastro-esophageal reflux disease with esophagitis 08/26/2015  . Spinal stenosis 08/26/2015  . Vitamin D deficiency 08/26/2015  . Diabetes mellitus, type 2 ([place]) 08/22/2015  . DDD (degenerative disc disease), cervical 07/10/2015  . Cervical radicular pain 04/30/2013  . Knee osteoarthritis 01/05/2012    [facility] ,MS, CCC-SLP  07/01/2017, 4:59 PM  [facility] [address] [place], [place], [address] Phone: [phone]   Fax:  [phone]   Name: Susan Patel MRN: [id number] Date of  Birth: 10/06/1940

## 2017-07-01 NOTE — Patient Instructions (Signed)
   VOICE EXERCISES  ---- FOR A LOOSE EASY GENTLE VOICE - use a pucker position Do each 10 times, THREE times a day  1) Siren        2) Waterfall       3) Breathy "whoooooooooo"

## 2017-07-02 ENCOUNTER — Other Ambulatory Visit: Payer: Self-pay | Admitting: Pediatrics

## 2017-07-02 DIAGNOSIS — M797 Fibromyalgia: Secondary | ICD-10-CM

## 2017-07-04 ENCOUNTER — Telehealth: Payer: Self-pay | Admitting: Pediatrics

## 2017-07-04 ENCOUNTER — Other Ambulatory Visit: Payer: Self-pay

## 2017-07-04 MED ORDER — GLUCOSE BLOOD VI STRP: ORAL_STRIP | 2 refills | Status: DC

## 2017-07-04 NOTE — Telephone Encounter (Signed)
sent 

## 2017-07-05 ENCOUNTER — Ambulatory Visit: Payer: Medicare HMO

## 2017-07-05 DIAGNOSIS — I69822 Dysarthria following other cerebrovascular disease: Secondary | ICD-10-CM | POA: Diagnosis not present

## 2017-07-05 DIAGNOSIS — R41841 Cognitive communication deficit: Secondary | ICD-10-CM | POA: Diagnosis not present

## 2017-07-05 DIAGNOSIS — R1312 Dysphagia, oropharyngeal phase: Secondary | ICD-10-CM | POA: Diagnosis not present

## 2017-07-05 DIAGNOSIS — R490 Dysphonia: Secondary | ICD-10-CM | POA: Diagnosis not present

## 2017-07-05 NOTE — Patient Instructions (Addendum)
   Continue your SIREN, WATERFALL, and "Coats" practice  ADD - 10 minutes of belly breathing twice a day

## 2017-07-05 NOTE — Therapy (Signed)
[facility] [address] [place], [place], [address] Phone: [phone]   Fax:  [phone]  Speech Language Pathology Treatment  Patient Details  Name: Susan Patel MRN: [id number] Date of Birth: Dec 12, 1940 Referring Provider: Lind Guest, MD   Encounter Date: 07/05/2017  End of Session - 07/05/17 1632    Visit Number  3    Number of Visits  17    Date for SLP Re-Evaluation  08/12/17    SLP Start Time  1533    SLP Stop Time   1615    SLP Time Calculation (min)  42 min    Activity Tolerance  Patient tolerated treatment well       Past Medical History:  Diagnosis Date  . Arthritis    'all over"  . Asthma   . Chronic pain   . COPD (chronic obstructive pulmonary disease) (HCC)    O2 per [place] at nights   . Depression   . Diabetes mellitus without complication ([place])    borderline- , states she was on med., but MD told her "everything is under control so I threw the bottle away"  . Fibromyalgia   . GERD (gastroesophageal reflux disease)   . Hypertension   . Neuromuscular disorder (HCC)    parkinson, neuropathy- both feet & hands.  . Stroke [facility])    residual dysarthria    Past Surgical History:  Procedure Laterality Date  . ABDOMINAL HYSTERECTOMY    . ANTERIOR CERVICAL DECOMP/DISCECTOMY FUSION N/A 07/10/2015   Procedure: Cervical five-six, Cervical six-seven anterior cervical decompression with fusion plating and bonegraft;  Surgeon: Jovita Gamma, MD;  Location: [facility];  Service: Neurosurgery;  Laterality: N/A;  . APPENDECTOMY    . BIOPSY EYE MUSCLE  03/20/2016   biopsy vessel to right eye due to swelling  . EYE SURGERY Bilateral    cataracts removed, /w "cyrstal lenses"   . SHOULDER ARTHROSCOPY Right    x2   RCR- spurs removed     There were no vitals filed for this visit.  Subjective Assessment - 07/05/17 1538    Subjective  Pt went down wrong hallway to ST - req'd ST redirection.     Currently in Pain?  No/denies            ADULT SLP TREATMENT - 07/05/17 1539      General Information   Behavior/Cognition  Cooperative;Pleasant mood      Treatment Provided   Treatment provided  Cognitive-Linquistic      Pain Assessment   Pain Assessment  0-10    Pain Score  8     Pain Location  hands    Pain Descriptors / Indicators  Sharp    Pain Intervention(s)  Monitored during session      Cognitive-Linquistic Treatment   Treatment focused on  Voice    Skilled Treatment  Pt reports she has practiced HEP "some" on "most days" since previous session. Pt's voice sounds (perceptually, subjectively) more relaxed than past session and evaluation. Pt performed HEP with pt for smooth, easy relaxed phonation with forward focus /u/ vowel and pitch variations. Pt with minimal pitch variation but subjectively, more than previous session. SLP req'd to perform with pt or pitch variation was minimal/nonexistent. In simple question and answer, pt remained without strained/strangled voice 95% of the time. SLP engaged pt in some conversational question and answering and pt remained with gentle and relaxed voice 90% of the time. SLP practiced abdominal breeathing (AB) at  rest with pt and she was successful 90% of the time with shallow breathing. When SLP encouraged pt to take deeper inhalation pt consistently exhibited what sounded like vocal fold spasm.       Assessment / Recommendations / Plan   Plan  Continue with current plan of care      Progression Toward Goals   Progression toward goals  Progressing toward goals       SLP Education - 07/05/17 1632    Education provided  Yes    Education Details  abdominal breathing at rest    Person(s) Educated  Patient    Methods  Explanation;Demonstration    Comprehension  Verbalized understanding;Returned demonstration;Need further instruction         SLP Long Term Goals - 07/05/17 1634      SLP LONG TERM GOAL #1   Title  pt will have  score <37 on the Voice Related QOL (VRQOL)    Time  3    Period  Weeks or 8 visits    Status  On-going      SLP LONG TERM GOAL #2   Title  pt will demo abdominal breathing 50% of the time at rest over three sessions    Baseline  07-05-17    Time  3    Period  Weeks or 8 visits    Status  On-going      SLP LONG TERM GOAL #3   Title  pt will demo more controlled voice using voice compensations in consonant-vowel combinations 80% of the time over two sessions    Time  4    Period  Weeks or 8 visits    Status  On-going      SLP LONG TERM GOAL #4   Title  pt will produce answers in question/answer tasks 80% with non-strained/strangled voice quality, 3 sessions    Baseline  07-05-17    Time  3    Period  Weeks    Status  New       Plan - 07/05/17 1632    Clinical Impression Statement  Pt with some limited success today in vocal relaxation strategies/techniques with simultaneous production with SLP. SLP reviewed abdominal breathing (AB) basics with pt today - pt to begin to practice at home. Pt cont with very shallow breathing but exhibited what sounded like vocal fold spasm upon deeper inhalation. Look at "skilled interventions" for more details. Pt would cont to benefit from skilled ST to improve pt's vocal quality.    Speech Therapy Frequency  2x / week    Duration  4 weeks or 8 visits    Treatment/Interventions  SLP instruction and feedback;Compensatory strategies;Functional tasks;Internal/external aids;Patient/family education;Multimodal communcation approach    Potential to Achieve Goals  Fair    Potential Considerations  Ability to learn/carryover information;Severity of impairments    Consulted and Agree with Plan of Care  Patient;Family member/caregiver    Family Member Consulted  husband       Patient will benefit from skilled therapeutic intervention in order to improve the following deficits and impairments:   Dysarthria following other cerebrovascular  disease  Dysphonia  Cognitive communication deficit    Problem List Patient Active Problem List   Diagnosis Date Noted  . 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 History of stroke 04/04/2017  . 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 Contusion of left elbow 03/15/2017  . Dysmetria   . Diabetes mellitus type 2 in nonobese (HCC)   . Supplemental oxygen  dependent   . Benign essential HTN   . Hypokalemia   . Hypoalbuminemia due to protein-calorie malnutrition ([place])   . Embolic stroke of right basal ganglia ([place]) 10/12/2016  . Oropharyngeal dysphagia   . Stroke ([place]) 10/09/2016  . Stroke (cerebrum) ([place]) 10/09/2016  . 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 Frequent falls 04/21/2016  . 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 Type 2 diabetes mellitus with peripheral neuropathy (HCC)   . Fibromyalgia   . Chronic pain syndrome   . Chronic obstructive pulmonary disease ([place])   . Gait disturbance, post-stroke   . Dysarthria, post-stroke   . Dysphagia, post-stroke   . AKI (acute kidney injury) ([place])   . Acute blood loss anemia   . Cerebrovascular accident (CVA) due to occlusion of basilar artery ([place])   . Mixed hyperlipidemia   . 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  . Allergic rhinitis 12/17/2015  . Primary osteoarthritis of both knees 11/19/2015  . Obesity (BMI 30-39.9) 09/04/2015  . Essential hypertension 09/04/2015  . Constipation 09/04/2015  . Asthma 08/26/2015  . Raynaud's phenomenon 08/26/2015  . Gastro-esophageal reflux disease with esophagitis 08/26/2015  . Spinal stenosis 08/26/2015  . Vitamin D deficiency 08/26/2015  . Diabetes mellitus, type 2 ([place]) 08/22/2015  . DDD (degenerative disc disease), cervical 07/10/2015  . Cervical radicular pain 04/30/2013  . Knee osteoarthritis 01/05/2012    [facility] ,MS, CCC-SLP  07/05/2017, 4:36 PM  [facility] [address] [place], [place], [address] Phone: [phone]   Fax:  [phone]   Name: Susan Patel MRN: [id number] Date of Birth: Nov 08, 1940

## 2017-07-08 ENCOUNTER — Other Ambulatory Visit: Payer: Self-pay

## 2017-07-08 ENCOUNTER — Ambulatory Visit: Payer: Medicare HMO

## 2017-07-08 DIAGNOSIS — R41841 Cognitive communication deficit: Secondary | ICD-10-CM | POA: Diagnosis not present

## 2017-07-08 DIAGNOSIS — R232 Flushing: Secondary | ICD-10-CM

## 2017-07-08 DIAGNOSIS — R490 Dysphonia: Secondary | ICD-10-CM | POA: Diagnosis not present

## 2017-07-08 DIAGNOSIS — I69822 Dysarthria following other cerebrovascular disease: Secondary | ICD-10-CM

## 2017-07-08 DIAGNOSIS — R1312 Dysphagia, oropharyngeal phase: Secondary | ICD-10-CM | POA: Diagnosis not present

## 2017-07-08 MED ORDER — GABAPENTIN 300 MG PO CAPS
ORAL_CAPSULE | ORAL | 1 refills | Status: DC
Start: 2017-07-08 — End: 2018-07-31

## 2017-07-08 NOTE — Therapy (Signed)
[facility] [address] [place], [place], [address] Phone: [phone]   Fax:  [phone]  Speech Language Pathology Treatment  Patient Details  Name: Susan Patel MRN: [id number] Date of Birth: 1940/08/24 Referring Provider: Lind Guest, MD   Encounter Date: 07/08/2017  End of Session - 07/08/17 1630    Visit Number  4    Number of Visits  17    Date for SLP Re-Evaluation  08/12/17    SLP Start Time  8527    SLP Stop Time   1615    SLP Time Calculation (min)  42 min    Activity Tolerance  Patient tolerated treatment well       Past Medical History:  Diagnosis Date  . Arthritis    'all over"  . Asthma   . Chronic pain   . COPD (chronic obstructive pulmonary disease) (HCC)    O2 per [place] at nights   . Depression   . Diabetes mellitus without complication ([place])    borderline- , states she was on med., but MD told her "everything is under control so I threw the bottle away"  . Fibromyalgia   . GERD (gastroesophageal reflux disease)   . Hypertension   . Neuromuscular disorder (HCC)    parkinson, neuropathy- both feet & hands.  . Stroke [facility])    residual dysarthria    Past Surgical History:  Procedure Laterality Date  . ABDOMINAL HYSTERECTOMY    . ANTERIOR CERVICAL DECOMP/DISCECTOMY FUSION N/A 07/10/2015   Procedure: Cervical five-six, Cervical six-seven anterior cervical decompression with fusion plating and bonegraft;  Surgeon: Jovita Gamma, MD;  Location: [facility];  Service: Neurosurgery;  Laterality: N/A;  . APPENDECTOMY    . BIOPSY EYE MUSCLE  03/20/2016   biopsy vessel to right eye due to swelling  . EYE SURGERY Bilateral    cataracts removed, /w "cyrstal lenses"   . SHOULDER ARTHROSCOPY Right    x2   RCR- spurs removed     There were no vitals filed for this visit.  Subjective Assessment - 07/08/17 1534    Subjective  Pt went into the first two ST rooms (both with lights off),  requiring redirection to go into the only ST room with the lights on.    Currently in Pain?  Yes    Pain Score  8     Pain Location  Foot    Pain Orientation  Right;Left    Pain Descriptors / Indicators  Shooting    Pain Type  Neuropathic pain    Pain Onset  More than a month ago    Pain Frequency  Constant    Aggravating Factors   cold weather    Pain Relieving Factors  meds    Effect of Pain on Daily Activities  difficult to move    Multiple Pain Sites  Yes    Pain Score  9    Pain Location  Hand    Pain Orientation  Right;Left    Pain Descriptors / Indicators  Aching    Pain Type  Neuropathic pain    Pain Onset  More than a month ago    Pain Frequency  Constant    Aggravating Factors   cold weather    Pain Relieving Factors  meds            ADULT SLP TREATMENT - 07/08/17 1538      General Information   Behavior/Cognition  Cooperative;Pleasant mood;Requires cueing decr'd initiation  Treatment Provided   Treatment provided  Cognitive-Linquistic      Cognitive-Linquistic Treatment   Treatment focused on  Voice    Skilled Treatment  Pt tells SLP she feels HEP has helped reduce the tightness in her voice. She tells SLP, upon questioning, that her voice is generally more normal souding and more smooth than prior to ST. SLP worked with pt today on abdominal breathing (AB). She completed structured tasks using AB with 85% success, then SLP engaged pt in short conversation of 6 and 8 minutes (min-mod complex) with pt performing AB at least 70% of the time. Pt still with more shallow breathing than what SLP would consider WNL but this appears functional for pt. SLP provided pt with assessment via Voice Related QOL and pt rated her QOL as 17 (down from 37 at eval), indicating she has improved QOL (excellent to good QOL) based upon her voice.       Assessment / Recommendations / Plan   Plan  Goals updated      Progression Toward Goals   Progression toward goals  Progressing  toward goals           SLP Long Term Goals - 07/08/17 1609      SLP LONG TERM GOAL #1   Title  pt will have score <37 on the Voice Related QOL (VRQOL)    Time  --    Period  -- or 8 visits    Status  Achieved      SLP LONG TERM GOAL #2   Title  pt will demo abdominal breathing 50% of the time at rest over three sessions    Baseline  07-05-17, 07-08-17    Time  3    Period  Weeks or 8 visits    Status  On-going      SLP LONG TERM GOAL #3   Title  pt will demo more controlled voice using voice compensations in consonant-vowel combinations 80% of the time over two sessions    Time  --    Period  -- or 8 visits    Status  Achieved      SLP LONG TERM GOAL #4   Title  pt will produce answers in question/answer tasks 80% with non-strained/strangled voice quality, 3 sessions    Baseline  07-05-17, 07-08-17    Time  3    Period  Weeks    Status  On-going      SLP LONG TERM GOAL #5   Title  Pt will maintain non strained-strangeled voice quality for 10 minutes simple to mod complex conversation over two sessions    Time  3    Period  Weeks    Status  New       Plan - 07/08/17 1631    Clinical Impression Statement  Pt with some success in structured tasks and in conversation using abdominal breathing (AB)  - pt will cont to practice at home. Pt cont with very shallow (but seemingly functional) breathing and cont'd to exhibit what sounded like vocal fold spasm upon deeper inhalation. Look at "skilled interventions" for more details. Pt would cont to benefit from skilled ST to improve pt's vocal quality.    Speech Therapy Frequency  2x / week    Duration  4 weeks or 8 visits    Treatment/Interventions  SLP instruction and feedback;Compensatory strategies;Functional tasks;Internal/external aids;Patient/family education;Multimodal communcation approach    Potential to Achieve Goals  Fair    Potential Considerations  Ability  to learn/carryover information;Severity of impairments     Consulted and Agree with Plan of Care  Patient;Family member/caregiver    Family Member Consulted  husband       Patient will benefit from skilled therapeutic intervention in order to improve the following deficits and impairments:   Dysphonia  Dysarthria following other cerebrovascular disease  Cognitive communication deficit  Dysphagia, oropharyngeal phase    Problem List Patient Active Problem List   Diagnosis Date Noted  . 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 History of stroke 04/04/2017  . 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 Contusion of left elbow 03/15/2017  . Dysmetria   . Diabetes mellitus type 2 in nonobese (HCC)   . Supplemental oxygen dependent   . Benign essential HTN   . Hypokalemia   . Hypoalbuminemia due to protein-calorie malnutrition ([place])   . Embolic stroke of right basal ganglia ([place]) 10/12/2016  . Oropharyngeal dysphagia   . Stroke ([place]) 10/09/2016  . Stroke (cerebrum) ([place]) 10/09/2016  . 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 Frequent falls 04/21/2016  . 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 Type 2 diabetes mellitus with peripheral neuropathy (HCC)   . Fibromyalgia   . Chronic pain syndrome   . Chronic obstructive pulmonary disease ([place])   . Gait disturbance, post-stroke   . Dysarthria, post-stroke   . Dysphagia, post-stroke   . AKI (acute kidney injury) ([place])   . Acute blood loss anemia   . 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 Mixed hyperlipidemia   . 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  . Allergic rhinitis 12/17/2015  . Primary osteoarthritis of both knees 11/19/2015  . Obesity (BMI 30-39.9) 09/04/2015  . Essential hypertension 09/04/2015  . Constipation 09/04/2015  . Asthma 08/26/2015  . Raynaud's phenomenon 08/26/2015  . Gastro-esophageal reflux disease with esophagitis 08/26/2015  . Spinal stenosis 08/26/2015  . Vitamin  D deficiency 08/26/2015  . Diabetes mellitus, type 2 ([place]) 08/22/2015  . DDD (degenerative disc disease), cervical 07/10/2015  . Cervical radicular pain 04/30/2013  . Knee osteoarthritis 01/05/2012    [facility] ,[place], CCC-SLP  07/08/2017, 4:49 PM  [facility] [address] [place], [place], [address] Phone: [phone]   Fax:  [phone]   Name: ALEXYIA GUARINO MRN: [id number] Date of Birth: 07/21/1940

## 2017-07-15 ENCOUNTER — Ambulatory Visit: Payer: Medicare HMO | Attending: Otolaryngology

## 2017-07-15 DIAGNOSIS — I69822 Dysarthria following other cerebrovascular disease: Secondary | ICD-10-CM | POA: Insufficient documentation

## 2017-07-15 DIAGNOSIS — R1312 Dysphagia, oropharyngeal phase: Secondary | ICD-10-CM | POA: Insufficient documentation

## 2017-07-15 DIAGNOSIS — R490 Dysphonia: Secondary | ICD-10-CM | POA: Insufficient documentation

## 2017-07-15 DIAGNOSIS — R41841 Cognitive communication deficit: Secondary | ICD-10-CM | POA: Insufficient documentation

## 2017-07-18 ENCOUNTER — Encounter: Payer: Self-pay | Admitting: Pediatrics

## 2017-07-18 ENCOUNTER — Ambulatory Visit (INDEPENDENT_AMBULATORY_CARE_PROVIDER_SITE_OTHER): Payer: Medicare HMO | Admitting: Pediatrics

## 2017-07-18 ENCOUNTER — Telehealth: Payer: Self-pay | Admitting: Pediatrics

## 2017-07-18 ENCOUNTER — Ambulatory Visit (INDEPENDENT_AMBULATORY_CARE_PROVIDER_SITE_OTHER): Payer: Medicare HMO

## 2017-07-18 VITALS — BP 137/80 | HR 70 | Temp 97.0°F | Ht 61.0 in | Wt 205.0 lb

## 2017-07-18 DIAGNOSIS — S79911A Unspecified injury of right hip, initial encounter: Secondary | ICD-10-CM | POA: Diagnosis not present

## 2017-07-18 DIAGNOSIS — R69 Illness, unspecified: Secondary | ICD-10-CM | POA: Diagnosis not present

## 2017-07-18 DIAGNOSIS — W19XXXA Unspecified fall, initial encounter: Secondary | ICD-10-CM

## 2017-07-18 DIAGNOSIS — R0789 Other chest pain: Secondary | ICD-10-CM | POA: Diagnosis not present

## 2017-07-18 DIAGNOSIS — R238 Other skin changes: Secondary | ICD-10-CM

## 2017-07-18 DIAGNOSIS — S299XXA Unspecified injury of thorax, initial encounter: Secondary | ICD-10-CM | POA: Diagnosis not present

## 2017-07-18 DIAGNOSIS — M25551 Pain in right hip: Secondary | ICD-10-CM

## 2017-07-18 MED ORDER — TRIAMCINOLONE 0.1 % CREAM:EUCERIN CREAM 1:1: 1.0000 "application " | TOPICAL_CREAM | Freq: Two times a day (BID) | CUTANEOUS | 1 refills | Status: DC

## 2017-07-18 MED ORDER — ONETOUCH ULTRASOFT LANCETS MISC: 12 refills | Status: DC

## 2017-07-18 NOTE — Telephone Encounter (Signed)
Already taken care of

## 2017-07-18 NOTE — Progress Notes (Signed)
Subjective:   Patient ID: Susan Patel, female    DOB: [birth year], 77 y.o.   MRN: [id number] CC: Fall (Friday 07/15/17); Hip Pain (right); and side Pain (right)  HPI: Susan Patel is a 77 y.o. female presenting for Fall (Friday 07/15/17); Hip Pain (right); and side Pain (right)  Fell 3 days ago soon after waking up.  She went to the bathroom and then was coming into the kitchen.  Her rolling walker it the piano bench causing her to fall backwards, hitting her side on the dining room table and then falling to the ground.  Her husband was in the kitchen when it happened, he helped get her back to standing.  She was able to weight-bear immediately after incident.  She has been sore on her right side especially when she takes a deep breath in her right hip since then.  She is able to walk.  She uses a walker most of the time at home.  Husband says sometimes she might go faster than ideal.  She has not had any other falls since last visit.  She has a small scrape over her right shoulder blade but otherwise no bruising.  She did not hit her head.  She did not feel lightheaded or woozy before fall.  She uses triamcinolone and Eucerin cream for itching and irritation in her genital area.  She keeps pads on due to urinary incontinence and think she has some irritation from them.  They are unscented.  She tries to change them regularly.  No rash in that area.  Relevant past medical, surgical, family and social history reviewed. Allergies and medications reviewed and updated. Social History   Tobacco Use  Smoking Status Never Smoker  Smokeless Tobacco Never Used   ROS: Per HPI   Objective:    BP 137/80   Pulse 70   Temp (!) 97 F (36.1 C) (Oral)   Ht 5\' 1"  (1.549 m)   Wt 205 lb (93 kg)   BMI 38.73 kg/m   Wt Readings from Last 3 Encounters:  07/18/17 205 lb (93 kg)  06/20/17 201 lb (91.2 kg)  05/03/17 200 lb 6.4 oz (90.9 kg)    Gen: NAD, alert, cooperative with exam, NCAT EYES: EOMI, no  conjunctival injection, or no icterus CV: NRRR, normal S1/S2, no murmur, distal pulses 2+ b/l Resp: CTABL, no wheezes, normal WOB Abd: +BS, soft, NTND. no guarding or organomegaly Ext: No edema, warm Neuro: Alert and oriented, speech dysarthric, gait slow, slightly shuffling/cathces heel b/l with each step MSK: Slightly tender to palpation over need ribs right side.  Minimal pain right hip with weightbearing.  Some pain with external rotation of right hip.  Assessment & Plan:  Susan Patel was seen today for fall, hip pain and side pain.  Diagnoses and all orders for this visit:  Fall, initial encounter Arthritis in hip, no fracture, pt weight bearing.  No fractures in ribs Discussed fall prevention, continue walker use -     DG Ribs Unilateral W/Chest Right; Future -     DG HIP UNILAT W OR W/O PELVIS 2-3 VIEWS RIGHT; Future  Skin irritation Avoid products that make irritation worse. Nothing stronger than OTC hydrocortisone in genital area, can use moisturizers. Any rash needs to be seen. -     Triamcinolone Acetonide (TRIAMCINOLONE 0.1 % CREAM : EUCERIN) CREA; Apply 1 application topically 2 (two) times daily.   Follow up plan: Return in about 3 months (around 10/18/2017). Assunta Found, MD Tristan Schroeder  [place]

## 2017-07-18 NOTE — Patient Instructions (Signed)
Use moisturizer alone, such as eucerin or cerave, for dry skin and irritation. To minimize steroid exposure to thin skin areas, use nothing strong than hydrocortisone over the counter in genital area if needed. If you develop a rash or if irritation does not improve come back to be seen.

## 2017-07-20 ENCOUNTER — Ambulatory Visit: Payer: Medicare HMO

## 2017-07-20 ENCOUNTER — Telehealth: Payer: Self-pay | Admitting: Pediatrics

## 2017-07-20 DIAGNOSIS — R1312 Dysphagia, oropharyngeal phase: Secondary | ICD-10-CM | POA: Diagnosis not present

## 2017-07-20 DIAGNOSIS — R41841 Cognitive communication deficit: Secondary | ICD-10-CM

## 2017-07-20 DIAGNOSIS — R490 Dysphonia: Secondary | ICD-10-CM | POA: Diagnosis not present

## 2017-07-20 DIAGNOSIS — I69822 Dysarthria following other cerebrovascular disease: Secondary | ICD-10-CM | POA: Diagnosis not present

## 2017-07-20 NOTE — Telephone Encounter (Signed)
Requesting results. Please review and advise.

## 2017-07-20 NOTE — Telephone Encounter (Signed)
Husband aware of xray results

## 2017-07-20 NOTE — Telephone Encounter (Signed)
Result notes in, no fractures, has arthritis in her hips

## 2017-07-20 NOTE — Therapy (Signed)
[facility] [address] [place], [place], [address] Phone: [phone]   Fax:  [phone]  Speech Language Pathology Treatment  Patient Details  Name: Susan Patel MRN: [id number] Date of Birth: 29-Dec-1940 Referring Provider: Lind Guest, MD   Encounter Date: 07/20/2017  End of Session - 07/20/17 1456    Visit Number  5    Number of Visits  17    Date for SLP Re-Evaluation  08/12/17    SLP Start Time  2694    SLP Stop Time   1444    SLP Time Calculation (min)  41 min    Activity Tolerance  Patient tolerated treatment well       Past Medical History:  Diagnosis Date  . Arthritis    'all over"  . Asthma   . Chronic pain   . COPD (chronic obstructive pulmonary disease) (HCC)    O2 per [place] at nights   . Depression   . Diabetes mellitus without complication ([place])    borderline- , states she was on med., but MD told her "everything is under control so I threw the bottle away"  . Fibromyalgia   . GERD (gastroesophageal reflux disease)   . Hypertension   . Neuromuscular disorder (HCC)    parkinson, neuropathy- both feet & hands.  . Stroke [facility])    residual dysarthria    Past Surgical History:  Procedure Laterality Date  . ABDOMINAL HYSTERECTOMY    . ANTERIOR CERVICAL DECOMP/DISCECTOMY FUSION N/A 07/10/2015   Procedure: Cervical five-six, Cervical six-seven anterior cervical decompression with fusion plating and bonegraft;  Surgeon: Jovita Gamma, MD;  Location: [facility];  Service: Neurosurgery;  Laterality: N/A;  . APPENDECTOMY    . BIOPSY EYE MUSCLE  03/20/2016   biopsy vessel to right eye due to swelling  . EYE SURGERY Bilateral    cataracts removed, /w "cyrstal lenses"   . SHOULDER ARTHROSCOPY Right    x2   RCR- spurs removed     There were no vitals filed for this visit.  Subjective Assessment - 07/20/17 1411    Subjective  Pt entered the correct ST room today.    Currently in Pain?   Yes    Pain Score  8     Pain Location  Shoulder    Pain Orientation  Right    Pain Descriptors / Indicators  Sore;Tender    Pain Type  Acute pain    Pain Onset  1 to 4 weeks ago    Pain Frequency  Constant    Aggravating Factors   sitting up    Pain Relieving Factors  lying down    Effect of Pain on Daily Activities  difficult to move            ADULT SLP TREATMENT - 07/20/17 1415      General Information   Behavior/Cognition  Alert;Cooperative;Pleasant mood;Distractible      Treatment Provided   Treatment provided  Cognitive-Linquistic      Cognitive-Linquistic Treatment   Treatment focused on  Voice    Skilled Treatment  Pt reports she has been consistent with the voice exercises. Conversation today for 8 minutes with pt with WNL vocal quality. SLP guided pt through the HEP for treating voice hyperfunction - she has considerably limited pitch range dur to CVA but did exhibit some pitch variation. Pt did require some cues for maintaining stronger/WNL vocal quality and negating vocal fry with "whoooooooo" exercise. SLP then practiced abdominal  breathing (AB) with pt in sentence responses- pt was 90% successful at this level. She maintained WNL vocal quality for 90%+ of the session today. She agrees on d/c next week.       Assessment / Recommendations / Plan   Plan  Continue with current plan of care      Progression Toward Goals   Progression toward goals  Progressing toward goals           SLP Long Term Goals - 07/20/17 1458      SLP LONG TERM GOAL #1   Title  pt will have score <37 on the Voice Related QOL (VRQOL)    Status  Achieved      SLP LONG TERM GOAL #2   Title  pt will demo abdominal breathing 50% of the time at rest over three sessions    Period  -- or 8 visits    Status  Achieved      SLP LONG TERM GOAL #3   Title  pt will demo more controlled voice using voice compensations in consonant-vowel combinations 80% of the time over two sessions     Period  -- or 8 visits    Status  Achieved      SLP LONG TERM GOAL #4   Title  pt will produce answers in question/answer tasks 80% with non-strained/strangled voice quality, 3 sessions    Status  Achieved      SLP LONG TERM GOAL #5   Title  Pt will maintain non strained-strangeled voice quality for 10 minutes simple to mod complex conversation over two sessions    Baseline  07-20-17    Time  2    Period  Weeks    Status  On-going       Plan - 07/20/17 1457    Clinical Impression Statement  Pt with very good success in structured tasks and in conversation using abdominal breathing (AB)  and with her WNL vocal quality. Pt completed HEP with min A usually from SLP. Vocal fold spasm heard x2 on deep inhalation. Look at "skilled interventions" for more details. Pt would cont to benefit from skilled ST to improve pt's vocal quality.    Speech Therapy Frequency  2x / week    Duration  4 weeks or 8 visits    Treatment/Interventions  SLP instruction and feedback;Compensatory strategies;Functional tasks;Internal/external aids;Patient/family education;Multimodal communcation approach    Potential to Achieve Goals  Fair    Potential Considerations  Ability to learn/carryover information;Severity of impairments    Consulted and Agree with Plan of Care  Patient;Family member/caregiver    Family Member Consulted  husband       Patient will benefit from skilled therapeutic intervention in order to improve the following deficits and impairments:   Dysphonia  Dysarthria following other cerebrovascular disease  Cognitive communication deficit  Dysphagia, oropharyngeal phase    Problem List Patient Active Problem List   Diagnosis Date Noted  . 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 History of stroke 04/04/2017  . 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 Contusion of left elbow 03/15/2017  . Dysmetria   . Diabetes mellitus type 2 in nonobese (HCC)   . Supplemental oxygen dependent   .  Benign essential HTN   . Hypokalemia   . Hypoalbuminemia due to protein-calorie malnutrition ([place])   . Embolic stroke of right basal ganglia ([place]) 10/12/2016  . Oropharyngeal dysphagia   . Stroke ([place]) 10/09/2016  . Stroke (cerebrum) ([place]) 10/09/2016  . Cognitive  deficit due to recent stroke 04/23/2016  . Frequent falls 04/21/2016  . 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 Type 2 diabetes mellitus with peripheral neuropathy (HCC)   . Fibromyalgia   . Chronic pain syndrome   . Chronic obstructive pulmonary disease ([place])   . Gait disturbance, post-stroke   . Dysarthria, post-stroke   . Dysphagia, post-stroke   . AKI (acute kidney injury) ([place])   . Acute blood loss anemia   . Cerebrovascular accident (CVA) due to occlusion of basilar artery ([place])   . Mixed hyperlipidemia   . 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  . Allergic rhinitis 12/17/2015  . Primary osteoarthritis of both knees 11/19/2015  . Obesity (BMI 30-39.9) 09/04/2015  . Essential hypertension 09/04/2015  . Constipation 09/04/2015  . Asthma 08/26/2015  . Raynaud's phenomenon 08/26/2015  . Gastro-esophageal reflux disease with esophagitis 08/26/2015  . Spinal stenosis 08/26/2015  . Vitamin D deficiency 08/26/2015  . Diabetes mellitus, type 2 ([place]) 08/22/2015  . DDD (degenerative disc disease), cervical 07/10/2015  . Cervical radicular pain 04/30/2013  . Knee osteoarthritis 01/05/2012    [facility] ,[place], CCC-SLP  07/20/2017, 3:00 PM  [facility] [address] [place], [place], [address] Phone: [phone]   Fax:  [phone]   Name: Susan Patel MRN: [id number] Date of Birth: 1941-05-07

## 2017-07-27 ENCOUNTER — Ambulatory Visit: Payer: Medicare HMO

## 2017-07-27 DIAGNOSIS — R41841 Cognitive communication deficit: Secondary | ICD-10-CM

## 2017-07-27 DIAGNOSIS — R490 Dysphonia: Secondary | ICD-10-CM

## 2017-07-27 DIAGNOSIS — R1312 Dysphagia, oropharyngeal phase: Secondary | ICD-10-CM

## 2017-07-27 DIAGNOSIS — I69822 Dysarthria following other cerebrovascular disease: Secondary | ICD-10-CM | POA: Diagnosis not present

## 2017-07-27 NOTE — Therapy (Signed)
[facility] [address] [place], [place], [address] Phone: [phone]   Fax:  [phone]  Speech Language Pathology Treatment  Patient Details  Name: Susan Patel MRN: [id number] Date of Birth: 10/03/1940 Referring Provider: Marcellino, Amanda, MD   Encounter Date: 07/27/2017  End of Session - 07/27/17 1442    Visit Number  6    Number of Visits  17    Date for SLP Re-Evaluation  08/12/17    SLP Start Time  1403    SLP Stop Time   1434    SLP Time Calculation (min)  31 min       Past Medical History:  Diagnosis Date  . Arthritis    'all over"  . Asthma   . Chronic pain   . COPD (chronic obstructive pulmonary disease) (HCC)    O2 per  at nights   . Depression   . Diabetes mellitus without complication (HCC)    borderline- , states she was on med., but MD told her "everything is under control so I threw the bottle away"  . Fibromyalgia   . GERD (gastroesophageal reflux disease)   . Hypertension   . Neuromuscular disorder (HCC)    parkinson, neuropathy- both feet & hands.  . Stroke (HCC)    residual dysarthria    Past Surgical History:  Procedure Laterality Date  . ABDOMINAL HYSTERECTOMY    . ANTERIOR CERVICAL DECOMP/DISCECTOMY FUSION N/A 07/10/2015   Procedure: Cervical five-six, Cervical six-seven anterior cervical decompression with fusion plating and bonegraft;  Surgeon: Robert Nudelman, MD;  Location: [facility];  Service: Neurosurgery;  Laterality: N/A;  . APPENDECTOMY    . BIOPSY EYE MUSCLE  03/20/2016   biopsy vessel to right eye due to swelling  . EYE SURGERY Bilateral    cataracts removed, /w "cyrstal lenses"   . SHOULDER ARTHROSCOPY Right    x2   RCR- spurs removed     There were no vitals filed for this visit.  Subjective Assessment - 07/27/17 1408    Subjective  Pt entered the wrong ST room.     Currently in Pain?  No/denies            ADULT SLP TREATMENT - 07/27/17  1410      General Information   Behavior/Cognition  Alert;Cooperative;Pleasant mood;Distractible      Treatment Provided   Treatment provided  Cognitive-Linquistic      Cognitive-Linquistic Treatment   Treatment focused on  Voice    Skilled Treatment  Pt spoke in a relaxed manner with good abdominal breathing use for 20 minutes, outside of ST room and 10 minutes in ST room. Last LTG was met. Pt is satisfied with ST progress however had question about her singing voice. SLP told pt that he thought the damage in her brain from the CVA would likely make singing very difficult at this time. Pt agreed with d/c at this time.      Assessment / Recommendations / Plan   Plan  Continue with current plan of care      Progression Toward Goals   Progression toward goals  Goals met, education completed, patient discharged from SLP       SLP Education - 07/27/17 1442    Education provided  Yes    Education Details  re: singing voice    Person(s) Educated  Patient    Methods  Explanation    Comprehension  Verbalized understanding           SLP Long Term Goals - 07/27/17 1444      SLP LONG TERM GOAL #1   Title  pt will have score <37 on the Voice Related QOL (VRQOL)    Status  Achieved      SLP LONG TERM GOAL #2   Title  pt will demo abdominal breathing 50% of the time at rest over three sessions    Period  -- or 8 visits    Status  Achieved      SLP LONG TERM GOAL #3   Title  pt will demo more controlled voice using voice compensations in consonant-vowel combinations 80% of the time over two sessions    Period  -- or 8 visits    Status  Achieved      SLP LONG TERM GOAL #4   Title  pt will produce answers in question/answer tasks 80% with non-strained/strangled voice quality, 3 sessions    Status  Achieved      SLP LONG TERM GOAL #5   Title  Pt will maintain non strained-strangeled voice quality for 10 minutes simple to mod complex conversation over two sessions    Status  Achieved        Plan - 07/27/17 1443    Clinical Impression Statement  Pt with very good success in conversation using abdominal breathing (AB)  and with her WNL vocal quality. Vocal fold spasm was not heard today. Look at "skilled interventions" for more details. Pt would cont to benefit from skilled ST to improve pt's vocal quality.    Speech Therapy Frequency  2x / week    Duration  4 weeks or 8 visits    Treatment/Interventions  SLP instruction and feedback;Compensatory strategies;Functional tasks;Internal/external aids;Patient/family education;Multimodal communcation approach    Potential to Achieve Goals  Fair    Potential Considerations  Ability to learn/carryover information;Severity of impairments    Consulted and Agree with Plan of Care  Patient;Family member/caregiver    Family Member Consulted  husband       Patient will benefit from skilled therapeutic intervention in order to improve the following deficits and impairments:   Dysphonia  Dysarthria following other cerebrovascular disease  Cognitive communication deficit  Dysphagia, oropharyngeal phase   SPEECH THERAPY DISCHARGE SUMMARY  Visits from Start of Care: 6  Current functional level related to goals / functional outcomes: Pt met all of her LTGs and states she is satisfied with her progress in skilled ST. She desires to obtain her singing voice and SLP educated pt that due to CVA her singing voice is unlikely to return to the same level it was at prior to her CVAs.  See goal update for more details. Pt agrees with d/c today.   Remaining deficits: Dysarthria, dysphagia, cognitive communication deficit, dysphonia.   Education / Equipment: Compensations for dysphonia, HEP for dysphonia.  Plan: Patient agrees to discharge.  Patient goals were met. Patient is being discharged due to meeting the stated rehab goals.  ?????Pt was told she must cont her HEP to bring best opportunity for maintaining proper quality with her  voice.      Problem List Patient Active Problem List   Diagnosis Date Noted  . 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 History of stroke 04/04/2017  . 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 Contusion of left elbow 03/15/2017  . Dysmetria   . Diabetes mellitus type 2 in nonobese (HCC)   . Supplemental oxygen dependent   . Benign essential HTN   . Hypokalemia   .  Hypoalbuminemia due to protein-calorie malnutrition (HCC)   . 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 Oropharyngeal dysphagia   . Stroke (HCC) 10/09/2016  . Stroke (cerebrum) (HCC) 10/09/2016  . 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 Frequent falls 04/21/2016  . 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 Type 2 diabetes mellitus with peripheral neuropathy (HCC)   . Fibromyalgia   . Chronic pain syndrome   . Chronic obstructive pulmonary disease (HCC)   . Gait disturbance, post-stroke   . Dysarthria, post-stroke   . Dysphagia, post-stroke   . AKI (acute kidney injury) (HCC)   . Acute blood loss anemia   . 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 Mixed hyperlipidemia   . 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  . Allergic rhinitis 12/17/2015  . Primary osteoarthritis of both knees 11/19/2015  . Obesity (BMI 30-39.9) 09/04/2015  . Essential hypertension 09/04/2015  . Constipation 09/04/2015  . Asthma 08/26/2015  . Raynaud's phenomenon 08/26/2015  . Gastro-esophageal reflux disease with esophagitis 08/26/2015  . Spinal stenosis 08/26/2015  . Vitamin D deficiency 08/26/2015  . Diabetes mellitus, type 2 (HCC) 08/22/2015  . DDD (degenerative disc disease), cervical 07/10/2015  . Cervical radicular pain 04/30/2013  . Knee osteoarthritis 01/05/2012    , ,MS, CCC-SLP  07/27/2017, 2:44 PM  [facility] [address] [place], [place], [address] Phone: [phone]   Fax:  [phone]   Name: Susan Patel MRN: [id number] Date of Birth: 12/20/1940 

## 2017-07-27 NOTE — Patient Instructions (Signed)
You will need to continue to do the exercises to keep your voice smooth and gentle.

## 2017-08-11 ENCOUNTER — Other Ambulatory Visit: Payer: Self-pay | Admitting: *Deleted

## 2017-08-11 DIAGNOSIS — J209 Acute bronchitis, unspecified: Secondary | ICD-10-CM

## 2017-08-11 MED ORDER — FLUTICASONE PROPIONATE 50 MCG/ACT NA SUSP: 2.0000 | Freq: Every day | NASAL | 1 refills | Status: DC

## 2017-08-13 ENCOUNTER — Other Ambulatory Visit: Payer: Self-pay | Admitting: Pediatrics

## 2017-08-13 DIAGNOSIS — K5900 Constipation, unspecified: Secondary | ICD-10-CM

## 2017-08-17 ENCOUNTER — Encounter: Payer: Self-pay | Admitting: *Deleted

## 2017-08-17 ENCOUNTER — Ambulatory Visit (INDEPENDENT_AMBULATORY_CARE_PROVIDER_SITE_OTHER): Payer: Medicare HMO | Admitting: *Deleted

## 2017-08-17 VITALS — BP 161/88 | HR 88 | Ht 61.0 in | Wt 207.0 lb

## 2017-08-17 DIAGNOSIS — Z0000 Encounter for general adult medical examination without abnormal findings: Secondary | ICD-10-CM

## 2017-08-17 NOTE — Patient Instructions (Signed)
  Susan Patel , Thank you for taking time to come for your Medicare Wellness Visit. I appreciate your ongoing commitment to your health goals. Please review the following plan we discussed and let me know if I can assist you in the future.   These are the goals we discussed: Goals    . DIET - INCREASE WATER INTAKE    . Exercise 150 min/wk Moderate Activity       This is a list of the screening recommended for you and due dates:  Health Maintenance  Topic Date Due  . Tetanus Vaccine  01/29/1962  . Pneumonia vaccines (2 of 2 - PPSV23) 02/15/2016  . Eye exam for diabetics  06/17/2016  . Complete foot exam   09/03/2016  . Mammogram  09/03/2017  . Hemoglobin A1C  09/20/2017  . Flu Shot  12/15/2017  . DEXA scan (bone density measurement)  Completed  . Colon Cancer Screening  Discontinued

## 2017-08-17 NOTE — Progress Notes (Addendum)
Subjective:   Susan Patel is a 77 y.o. female who presents for an Initial Medicare Annual Wellness Visit. 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 Her husband is a Theme park manager and he accompanies her here today. 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 Most noticeably is her balance. She has frequent falls. She is able to care for herself independently, she just has to be careful not to fall. She has a walker but does not use it all of the time. Susan Patel has two adult children (adopted) and 4 grandchildren. She and her husband have recently joined a gym but have not started going yet.    Review of Systems    Patient's health has improved some over the past year.   Cardiac Risk Factors include: advanced age (>51men, >29 women);obesity (BMI >30kg/m2);sedentary lifestyle;hypertension;dyslipidemia;diabetes mellitus  Neuro: Some trouble with balance since CVA. Has frequent falls. Has wakler but doesn't use it all of the time. Has trouble with backing up. Has had some minor injuries with bruising. She did suffer a concussion but no further had trauma and no broken bones. Hands and arms tremble. Husband concerned that she may have Parkinson's. Trembling is worse with fine motor activities though. She also has peripheral neuropathy that she takes gabapentin for.      Objective:    Today's Vitals   08/17/17 1417  BP: (!) 161/88  Pulse: 88  Weight: 207 lb (93.9 kg)  Height: 5\' 1"  (1.549 m)   Body mass index is 39.11 kg/m.  Advanced Directives 08/17/2017 08/17/2017 06/09/2017 01/20/2017 12/02/2016 10/26/2016 10/26/2016  Does Patient Have a Medical Advance Directive? (No Data) No No No No No No  Does patient want to make changes to medical advance directive? - - - - - - -  Would patient like information on creating a medical advance directive? - No - Patient declined Yes (MAU/Ambulatory/Procedural Areas - Information given) - No - Patient declined No - Patient declined No -  Patient declined    Current Medications (verified) Outpatient Encounter Medications as of 08/17/2017  Medication Sig  . albuterol (PROVENTIL HFA;VENTOLIN HFA) 108 (90 Base) MCG/ACT inhaler Inhale 1-2 puffs into the lungs every 6 (six) hours as needed for wheezing or shortness of breath.  . clopidogrel (PLAVIX) 75 MG tablet TAKE 1 TABLET BY MOUTH EVERY DAY  . DULoxetine (CYMBALTA) 30 MG capsule TAKE 2 CAPSULES (60 MG TOTAL) DAILY BY MOUTH.  . famotidine (PEPCID) 20 MG tablet Take 1 tablet (20 mg total) 2 (two) times daily as needed by mouth for heartburn or indigestion.  . fluticasone (FLONASE) 50 MCG/ACT nasal spray Place 2 sprays into both nostrils daily.  . fluticasone furoate-vilanterol (BREO ELLIPTA) 200-25 MCG/INH AEPB Inhale 1 puff into the lungs daily.  Marland Kitchen gabapentin (NEURONTIN) 300 MG capsule TAKE 1 CAPSULE BY MOUTH THREE TIMES A DAY  . glimepiride (AMARYL) 2 MG tablet TAKE 1 TABLET (2 MG TOTAL) BY MOUTH DAILY BEFORE BREAKFAST.  Marland Kitchen glucose blood test strip Use as instructed  . Lancets (ONETOUCH ULTRASOFT) lancets Use as instructed  . latanoprost (XALATAN) 0.005 % ophthalmic solution Place 1 drop into both eyes at bedtime.  Marland Kitchen LINZESS 72 MCG capsule TAKE 1 CAPSULE (72 MCG TOTAL) BY MOUTH DAILY BEFORE BREAKFAST.  Marland Kitchen lisinopril (PRINIVIL,ZESTRIL) 20 MG tablet Take 1 tablet (20 mg total) by mouth daily. (Patient taking differently: Take 40 mg by mouth daily. )  . montelukast (SINGULAIR) 10 MG tablet Take 1 tablet (  10 mg total) by mouth at bedtime.  . OXYGEN Inhale 3-4 L into the lungs at bedtime.  . rosuvastatin (CRESTOR) 40 MG tablet Take 1 tablet (40 mg total) by mouth every evening.  . traMADol (ULTRAM) 50 MG tablet TAKE 1 TABLET EVERY 8 HOURS AS NEEDED FOR MODERATE OR SEVERE PAIN  . traZODone (DESYREL) 50 MG tablet TAKE 1 TABLET (50 MG TOTAL) BY MOUTH NIGHTLY AS NEEDED FOR FOR SLEEP.  . Triamcinolone Acetonide (TRIAMCINOLONE 0.1 % CREAM : EUCERIN) CREA Apply 1 application topically 2 (two)  times daily.  . Triamcinolone Acetonide (TRIAMCINOLONE 0.1 % CREAM : EUCERIN) CREA APPLY TOPICALLY TWICE DAILY  . [DISCONTINUED] tolterodine (DETROL) 1 MG tablet Take 1 tablet (1 mg total) by mouth 2 (two) times daily.   No facility-administered encounter medications on file as of 08/17/2017.     Allergies (verified) Azithromycin; Prednisone; Sulfamethoxazole-trimethoprim; Amlodipine; Ciprofloxacin; and Tape   History: Past Medical History:  Diagnosis Date  . Arthritis    'all over"  . Asthma   . Chronic pain   . COPD (chronic obstructive pulmonary disease) (HCC)    O2 per [place] at nights   . Depression   . Diabetes mellitus without complication ([place])    borderline- , states she was on med., but MD told her "everything is under control so I threw the bottle away"  . Fibromyalgia   . GERD (gastroesophageal reflux disease)   . Hypertension   . Neuromuscular disorder (HCC)    parkinson, neuropathy- both feet & hands.  . 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 ABDOMINAL HYSTERECTOMY    . ANTERIOR CERVICAL DECOMP/DISCECTOMY FUSION N/A 07/10/2015   Procedure: Cervical five-six, Cervical six-seven anterior cervical decompression with fusion plating and bonegraft;  Surgeon: Jovita Gamma, MD;  Location: [place] [facility];  Service: Neurosurgery;  Laterality: N/A;  . APPENDECTOMY    . BIOPSY EYE MUSCLE  03/20/2016   biopsy vessel to right eye due to swelling  . EYE SURGERY Bilateral    cataracts removed, /w "cyrstal lenses"   . SHOULDER ARTHROSCOPY Right    x2   RCR- spurs removed    Family History  Problem Relation Age of Onset  . Heart disease Mother   . Cancer Father   . Heart disease Father   . Arthritis Sister   . Heart failure Sister   . Cancer Brother   . Thyroid cancer Son   . Atrial fibrillation Son   . Arthritis Sister    Social History   Socioeconomic History  . Marital status: Married    Spouse name: wayne  .  Number of children: 2  . Years of education: 16  . Highest education level: Bachelor's degree (e.g., BA, AB, BS)  Occupational History  . Occupation: retired    Comment: Automotive engineer  Social Needs  . Financial resource strain: Not hard at all  . Food insecurity:    Worry: Never true    Inability: Never true  . Transportation needs:    Medical: No    Non-medical: No  Tobacco Use  . Smoking status: Never Smoker  . Smokeless tobacco: Never Used  Substance and Sexual Activity  . Alcohol use: No  . Drug use: No  . Sexual activity: Yes  Lifestyle  . Physical activity:    Days per week: 0 days    Minutes per session: 0 min  . Stress: Not at all  Relationships  .  Social connections:    Talks on phone: More than three times a week    Gets together: More than three times a week    Attends religious service: More than 4 times per year    Active member of club or organization: Yes    Attends meetings of clubs or organizations: More than 4 times per year    Relationship status: Married  Other Topics Concern  . Not on file  Social History Narrative  . Not on file    Tobacco Counseling No tobacco use  Clinical Intake:    Pain : 0-10 Pain Type: Chronic pain Pain Location: (hands, feet, and legs) Pain Descriptors / Indicators: Aching Pain Onset: More than a month ago Pain Frequency: Constant Pain Relieving Factors: takes neurontin Effect of Pain on Daily Activities: minimal  Pain Relieving Factors: takes neurontin  Nutritional Status: BMI 25 -29 Overweight Diabetes: Yes CBG done?: No Did pt. bring in CBG monitor from home?: No  How often do you need to have someone help you when you read instructions, pamphlets, or other written materials from your doctor or pharmacy?: 1 - Never What is the last grade level you completed in school?: 16 years-[employer] degree  Interpreter Needed?: No  Information entered by :: Chong Sicilian, RN   Activities of Daily  Living In your present state of health, do you have any difficulty performing the following activities: 08/17/2017 10/09/2016  Hearing? N N  Vision? Y N  Comment decreased vision due to TIA and CVA. Last eye exam was in November 2018.  -  Difficulty concentrating or making decisions? N N  Walking or climbing stairs? Y Y  Comment frequent falls. has a walker and a chair to go upstairs. Has rails on four steps that come into home from garage -  Dressing or bathing? N Y  Doing errands, shopping? N Y  Conservation officer, nature and eating ? N -  Using the Toilet? N -  In the past six months, have you accidently leaked urine? Y -  Do you have problems with loss of bowel control? N -  Managing your Medications? N -  Managing your Finances? N -  Housekeeping or managing your Housekeeping? N -  Some recent data might be hidden     Immunizations and Health Maintenance Immunization History  Administered Date(s) Administered  . Influenza, High Dose Seasonal PF 03/23/2017  . Influenza,inj,Quad PF,6+ Mos 03/21/2016  . Pneumococcal Conjugate-13 04/04/2014  . Pneumococcal Polysaccharide-23 03/27/2009  . Tdap 05/31/2011   Health Maintenance Due  Topic Date Due  . OPHTHALMOLOGY EXAM  06/17/2016  . FOOT EXAM  09/03/2016    Patient Care Team: Eustaquio Maize, MD as PCP - General (Pediatrics) Leonie Man, MD-Neuro  Hospitalized May 2018 for CVA. No other hospitalizations, ER visits, or surgeries.      Assessment:   This is a routine wellness examination for Susan Patel.  Hearing/Vision screen No deficits noted during visit. Eye exam is up to date with [facility] in [place].   Dietary issues and exercise activities discussed:  Diet: Eats two or three meals a day. Mix of home prepared and eating out.  Drinks about 32 oz of water. 16 oz of diet soda a day and some tea.   Current Exercise Habits: (S) The patient does not participate in regular exercise at present(has joined gym but has not started going),  Exercise limited by: neurologic condition(s);orthopedic condition(s)  Goals    . DIET - INCREASE WATER INTAKE    .  Exercise 150 min/wk Moderate Activity      Depression Screen PHQ 2/9 Scores 08/17/2017 07/18/2017 05/03/2017 03/23/2017 03/14/2017 03/03/2017 11/26/2016  PHQ - 2 Score 0 0 0 0 0 0 0  PHQ- 9 Score - - - - - - 4    Fall Risk Fall Risk  08/17/2017 07/18/2017 05/03/2017 03/24/2017 03/23/2017  Falls in the past year? Yes Yes Yes No Yes  Comment side effect of CVA. Has assistive devices, walker, but doesn't use it all of the time - - - -  Number falls in past yr: 2 or more 1 2 or more - 2 or more  Injury with Fall? Yes Yes Yes - Yes  Comment - - - - -  Risk Factor Category  High Fall Risk - High Fall Risk - High Fall Risk  Risk for fall due to : History of fall(s);Impaired balance/gait History of fall(s);Impaired balance/gait History of fall(s);Impaired balance/gait;Impaired mobility;Mental status change - History of fall(s);Impaired balance/gait;Impaired mobility  Risk for fall due to: Comment - - - - -  Follow up Falls prevention discussed;Education provided - - - -    Is the patient's home free of loose throw rugs in walkways, pet beds, electrical cords, etc?   yes      Grab bars in the bathroom? yes      Handrails on the stairs?   yes      Adequate lighting?   yes   Cognitive Function: MMSE - Mini Mental State Exam 08/17/2017  Orientation to time 4  Orientation to Place 5  Registration 3  Attention/ Calculation 5  Recall 3  Language- name 2 objects 2  Language- repeat 1  Language- follow 3 step command 3  Language- read & follow direction 1  Write a sentence 1  Copy design 0  Total score 28  Was unable to accurately copy the design and stated that the season was "fall" instead of "spring". Otherwise normal exam especially considering latent effects of CVA.      Screening Tests Health Maintenance  Topic Date Due  . OPHTHALMOLOGY EXAM  06/17/2016  . FOOT EXAM   09/03/2016  . MAMMOGRAM  09/03/2017  . HEMOGLOBIN A1C  09/20/2017  . INFLUENZA VACCINE  12/15/2017  . TETANUS/TDAP  05/30/2021  . DEXA SCAN  Completed  . PNA vac Low Risk Adult  Completed  . COLONOSCOPY  Discontinued   Declines colon cancer screening and mammogram.    Plan:   F/u with PCP scheduled Keep appt with Neuro Use assistive devices, like your walker, to decrease risk of falls Move carefully to avoid falls Consider colon cancers screening and mammogram Increase water intake Aim for 150 min of moderate activity a week  I have personally reviewed and noted the following in the patient's chart:   . Medical and social history . Use of alcohol, tobacco or illicit drugs  . Current medications and supplements . Functional ability and status . Nutritional status . Physical activity . Advanced directives . List of other physicians . Hospitalizations, surgeries, and ER visits in previous 12 months . Vitals . Screenings to include cognitive, depression, and falls . Referrals and appointments  In addition, I have reviewed and discussed with patient certain preventive protocols, quality metrics, and best practice recommendations. A written personalized care plan for preventive services as well as general preventive health recommendations were provided to patient.     Chong Sicilian, RN   08/17/2017    I have reviewed and agree with the  above AWV documentation.   Assunta Found, MD [facility] 08/18/2017, 8:11 AM

## 2017-08-18 ENCOUNTER — Telehealth: Payer: Self-pay | Admitting: Pediatrics

## 2017-08-18 DIAGNOSIS — R69 Illness, unspecified: Secondary | ICD-10-CM | POA: Diagnosis not present

## 2017-08-23 ENCOUNTER — Telehealth: Payer: Self-pay

## 2017-08-23 NOTE — Telephone Encounter (Signed)
I called Susan Patel to offer her a sooner appt with Janett Billow, NP rather than her currently scheduled appt with Hoyle Sauer, NP. Susan Patel's husband, [name], per DPR, answered the phone, said he will ask Susan Patel to call me back to discuss later. If Susan Patel calls back, please discuss this with her and schedule as appropriate.

## 2017-08-27 ENCOUNTER — Other Ambulatory Visit: Payer: Self-pay | Admitting: Pediatrics

## 2017-08-30 NOTE — Telephone Encounter (Signed)
No return call - closing chart 

## 2017-09-01 ENCOUNTER — Other Ambulatory Visit: Payer: Self-pay | Admitting: Pediatrics

## 2017-09-01 DIAGNOSIS — K219 Gastro-esophageal reflux disease without esophagitis: Secondary | ICD-10-CM

## 2017-09-06 ENCOUNTER — Other Ambulatory Visit: Payer: Self-pay | Admitting: Pediatrics

## 2017-09-11 DIAGNOSIS — R69 Illness, unspecified: Secondary | ICD-10-CM | POA: Diagnosis not present

## 2017-09-14 ENCOUNTER — Other Ambulatory Visit: Payer: Self-pay | Admitting: Pediatrics

## 2017-09-14 DIAGNOSIS — G8929 Other chronic pain: Secondary | ICD-10-CM

## 2017-09-14 DIAGNOSIS — R69 Illness, unspecified: Secondary | ICD-10-CM | POA: Diagnosis not present

## 2017-09-15 ENCOUNTER — Telehealth: Payer: Self-pay | Admitting: Pediatrics

## 2017-09-15 DIAGNOSIS — G4700 Insomnia, unspecified: Secondary | ICD-10-CM | POA: Diagnosis not present

## 2017-09-15 DIAGNOSIS — G8929 Other chronic pain: Secondary | ICD-10-CM | POA: Diagnosis not present

## 2017-09-15 DIAGNOSIS — I10 Essential (primary) hypertension: Secondary | ICD-10-CM | POA: Diagnosis not present

## 2017-09-15 DIAGNOSIS — I69328 Other speech and language deficits following cerebral infarction: Secondary | ICD-10-CM | POA: Diagnosis not present

## 2017-09-15 DIAGNOSIS — E1142 Type 2 diabetes mellitus with diabetic polyneuropathy: Secondary | ICD-10-CM | POA: Diagnosis not present

## 2017-09-15 DIAGNOSIS — I499 Cardiac arrhythmia, unspecified: Secondary | ICD-10-CM | POA: Diagnosis not present

## 2017-09-15 DIAGNOSIS — J45909 Unspecified asthma, uncomplicated: Secondary | ICD-10-CM | POA: Diagnosis not present

## 2017-09-15 DIAGNOSIS — I951 Orthostatic hypotension: Secondary | ICD-10-CM | POA: Diagnosis not present

## 2017-09-15 DIAGNOSIS — F419 Anxiety disorder, unspecified: Secondary | ICD-10-CM | POA: Diagnosis not present

## 2017-09-15 DIAGNOSIS — R69 Illness, unspecified: Secondary | ICD-10-CM | POA: Diagnosis not present

## 2017-09-15 NOTE — Telephone Encounter (Signed)
Patient aware she will need to be seen before a refill for tramadol can be sent to pharmacy

## 2017-09-20 DIAGNOSIS — J383 Other diseases of vocal cords: Secondary | ICD-10-CM | POA: Diagnosis not present

## 2017-09-20 DIAGNOSIS — H938X3 Other specified disorders of ear, bilateral: Secondary | ICD-10-CM | POA: Diagnosis not present

## 2017-09-20 DIAGNOSIS — Z8673 Personal history of transient ischemic attack (TIA), and cerebral infarction without residual deficits: Secondary | ICD-10-CM | POA: Diagnosis not present

## 2017-09-20 DIAGNOSIS — G20 Parkinson's disease: Secondary | ICD-10-CM | POA: Diagnosis not present

## 2017-09-21 ENCOUNTER — Ambulatory Visit: Payer: Medicare Other | Admitting: Nurse Practitioner

## 2017-09-23 ENCOUNTER — Telehealth: Payer: Self-pay | Admitting: Pediatrics

## 2017-09-23 NOTE — Telephone Encounter (Signed)
She wasn't denied. She was told she would need to be seen.

## 2017-09-23 NOTE — Telephone Encounter (Signed)
appt made

## 2017-09-25 ENCOUNTER — Other Ambulatory Visit: Payer: Self-pay | Admitting: Pediatrics

## 2017-09-25 DIAGNOSIS — M797 Fibromyalgia: Secondary | ICD-10-CM

## 2017-09-26 NOTE — Telephone Encounter (Signed)
OV 09/28/17

## 2017-09-28 ENCOUNTER — Telehealth: Payer: Self-pay | Admitting: Pediatrics

## 2017-09-28 ENCOUNTER — Encounter: Payer: Self-pay | Admitting: Pediatrics

## 2017-09-28 ENCOUNTER — Ambulatory Visit (INDEPENDENT_AMBULATORY_CARE_PROVIDER_SITE_OTHER): Payer: Medicare HMO | Admitting: Pediatrics

## 2017-09-28 VITALS — BP 138/82 | HR 83 | Temp 95.8°F | Resp 20 | Ht 61.0 in | Wt 198.0 lb

## 2017-09-28 DIAGNOSIS — G8929 Other chronic pain: Secondary | ICD-10-CM | POA: Diagnosis not present

## 2017-09-28 DIAGNOSIS — H65111 Acute and subacute allergic otitis media (mucoid) (sanguinous) (serous), right ear: Secondary | ICD-10-CM | POA: Diagnosis not present

## 2017-09-28 DIAGNOSIS — J329 Chronic sinusitis, unspecified: Secondary | ICD-10-CM | POA: Diagnosis not present

## 2017-09-28 DIAGNOSIS — K5900 Constipation, unspecified: Secondary | ICD-10-CM

## 2017-09-28 DIAGNOSIS — W19XXXA Unspecified fall, initial encounter: Secondary | ICD-10-CM

## 2017-09-28 MED ORDER — TRAMADOL HCL 50 MG PO TABS: ORAL_TABLET | ORAL | 2 refills | Status: DC

## 2017-09-28 MED ORDER — RAISED TOILET SEAT/LOCK & ARMS MISC
1.0000 | Freq: Once | 0 refills | Status: DC
Start: 2017-09-28 — End: 2017-09-28

## 2017-09-28 MED ORDER — RAISED TOILET SEAT/LOCK & ARMS MISC: 1.0000 | Freq: Once | 0 refills | Status: DC

## 2017-09-28 MED ORDER — RAISED TOILET SEAT/LOCK & ARMS MISC: 1.0000 | Freq: Once | 0 refills | Status: AC

## 2017-09-28 MED ORDER — AMOXICILLIN-POT CLAVULANATE 875-125 MG PO TABS: 1.0000 | ORAL_TABLET | Freq: Two times a day (BID) | ORAL | 0 refills | Status: DC

## 2017-09-28 MED ORDER — LINACLOTIDE 290 MCG PO CAPS: 290.0000 ug | ORAL_CAPSULE | Freq: Every day | ORAL | 1 refills | Status: DC

## 2017-09-28 NOTE — Patient Instructions (Addendum)
Call to reschedule appointment:  [facility] [address] [place], [place] [address]  Phone [phone]

## 2017-09-28 NOTE — Progress Notes (Signed)
  Subjective:   Patient ID: Susan Patel, female    DOB: 18-May-1940, 77 y.o.   MRN: [id number] CC: Medication Refill; Cough; and Sneezing  HPI: Susan Patel is a 77 y.o. female   Little over week ago started having runny nose nasal congestion.  Now with cough is been fairly constant.  No shortness of breath.  Appetite is been okay.  No fevers.  Continues to have a hard time catching herself if she is moving backwards.  Has been tries to stay with her to remind her to turn around when needed.  Recent fall in the bathroom, trying to sit down on the toilet, missed the toilet and fell sitting down hard.  She feels recovered from that fall now.  Another recent fall in the laundry room when she started moving backwards and could not catch herself.  She does have a handle to the left of the toilet.  Indication for chronic opioid: chronic pain in hands, knees, hips related to arthritis and fibromyalgia. Medication and dose: Tramadol 50 mg twice a day # pills per month: 60 Opioid Treatment Agreement signed (Y/N):  Y NCCSRS reviewed this encounter (include red flags):   Yes   Relevant past medical, surgical, family and social history reviewed. Allergies and medications reviewed and updated. Social History   Tobacco Use  Smoking Status Never Smoker  Smokeless Tobacco Never Used   ROS: Per HPI   Objective:    BP 138/82   Pulse 83   Temp (!) 95.8 F (35.4 C) (Oral)   Resp 20   Ht 5\' 1"  (1.549 m)   Wt 198 lb (89.8 kg)   SpO2 96%   BMI 37.41 kg/m   Wt Readings from Last 3 Encounters:  09/28/17 198 lb (89.8 kg)  08/17/17 207 lb (93.9 kg)  07/18/17 205 lb (93 kg)    Gen: NAD, alert EYES: EOMI, no conjunctival injection, or no icterus ENT: Right TM pink with mucoid effusion.  Left TM with serous effusion. OP without erythema LYMPH: no cervical LAD CV: NRRR, normal S1/S2, no murmur, distal pulses 2+ b/l Resp: CTABL, no wheezes, normal WOB Ext: No edema, warm Neuro: Alert and  oriented  Assessment & Plan:  Susan Patel was seen today for medication refill, cough and sneezing.  Diagnoses and all orders for this visit:  Acute mucoid otitis media of right ear Symptomatic care discussed, return precautions. -     amoxicillin-clavulanate (AUGMENTIN) 875-125 MG tablet; Take 1 tablet by mouth 2 (two) times daily.  Other chronic pain Will continue below.  Continue to discuss alternative ways to treat pain, patient to try to minimize below.  Recently joined a gym for water therapy.  Okay to try gentle walking regularly, lidocaine cream or Aspercreme on her hands, ice and heat. -     traMADol (ULTRAM) 50 MG tablet; TAKE 1 TABLET EVERY 12 HOURS AS NEEDED FOR MODERATE OR SEVERE PAIN  Constipation, unspecified constipation type Ongoing symptoms, will increase dose of below. -     linaclotide (LINZESS) 290 MCG CAPS capsule; Take 1 capsule (290 mcg total) by mouth daily before breakfast.  Due for follow-up with neurology post stroke, had to cancel appointment last week.  Recent visit with ENT, some concern the voice is worsening due to early Parkinson's.  Phone number given for her neurologist.  Follow up plan: 3 months, sooner if needed Susan Found, MD [place]

## 2017-09-28 NOTE — Telephone Encounter (Signed)
Would like to try at [facility]

## 2017-09-29 ENCOUNTER — Other Ambulatory Visit: Payer: Self-pay | Admitting: Pediatrics

## 2017-10-03 DIAGNOSIS — M170 Bilateral primary osteoarthritis of knee: Secondary | ICD-10-CM | POA: Diagnosis not present

## 2017-10-31 DIAGNOSIS — R69 Illness, unspecified: Secondary | ICD-10-CM | POA: Diagnosis not present

## 2017-11-01 DIAGNOSIS — Z882 Allergy status to sulfonamides status: Secondary | ICD-10-CM | POA: Diagnosis not present

## 2017-11-01 DIAGNOSIS — G20 Parkinson's disease: Secondary | ICD-10-CM | POA: Diagnosis not present

## 2017-11-01 DIAGNOSIS — Z9181 History of falling: Secondary | ICD-10-CM | POA: Diagnosis not present

## 2017-11-01 DIAGNOSIS — R490 Dysphonia: Secondary | ICD-10-CM | POA: Diagnosis not present

## 2017-11-01 DIAGNOSIS — Z888 Allergy status to other drugs, medicaments and biological substances status: Secondary | ICD-10-CM | POA: Diagnosis not present

## 2017-11-01 DIAGNOSIS — R471 Dysarthria and anarthria: Secondary | ICD-10-CM | POA: Diagnosis not present

## 2017-11-01 DIAGNOSIS — Z8673 Personal history of transient ischemic attack (TIA), and cerebral infarction without residual deficits: Secondary | ICD-10-CM | POA: Diagnosis not present

## 2017-11-01 DIAGNOSIS — Z881 Allergy status to other antibiotic agents status: Secondary | ICD-10-CM | POA: Diagnosis not present

## 2017-11-01 DIAGNOSIS — J383 Other diseases of vocal cords: Secondary | ICD-10-CM | POA: Diagnosis not present

## 2017-11-03 ENCOUNTER — Ambulatory Visit (INDEPENDENT_AMBULATORY_CARE_PROVIDER_SITE_OTHER): Payer: Medicare HMO | Admitting: Pediatrics

## 2017-11-03 ENCOUNTER — Encounter: Payer: Self-pay | Admitting: Pediatrics

## 2017-11-03 VITALS — BP 139/79 | HR 77 | Temp 97.0°F | Ht 61.0 in | Wt 204.4 lb

## 2017-11-03 DIAGNOSIS — E119 Type 2 diabetes mellitus without complications: Secondary | ICD-10-CM

## 2017-11-03 DIAGNOSIS — I6990 Unspecified sequelae of unspecified cerebrovascular disease: Secondary | ICD-10-CM | POA: Diagnosis not present

## 2017-11-03 DIAGNOSIS — J3089 Other allergic rhinitis: Secondary | ICD-10-CM

## 2017-11-03 DIAGNOSIS — I10 Essential (primary) hypertension: Secondary | ICD-10-CM

## 2017-11-03 DIAGNOSIS — E1169 Type 2 diabetes mellitus with other specified complication: Secondary | ICD-10-CM | POA: Diagnosis not present

## 2017-11-03 DIAGNOSIS — G8929 Other chronic pain: Secondary | ICD-10-CM

## 2017-11-03 DIAGNOSIS — G4700 Insomnia, unspecified: Secondary | ICD-10-CM | POA: Diagnosis not present

## 2017-11-03 DIAGNOSIS — R079 Chest pain, unspecified: Secondary | ICD-10-CM | POA: Diagnosis not present

## 2017-11-03 DIAGNOSIS — E785 Hyperlipidemia, unspecified: Secondary | ICD-10-CM

## 2017-11-03 DIAGNOSIS — M797 Fibromyalgia: Secondary | ICD-10-CM

## 2017-11-03 LAB — BAYER DCA HB A1C WAIVED: HB A1C (BAYER DCA - WAIVED): 6.3 % (ref <7.0)

## 2017-11-03 MED ORDER — LISINOPRIL 20 MG PO TABS: 40.0000 mg | ORAL_TABLET | Freq: Every day | ORAL | 1 refills | Status: DC

## 2017-11-03 MED ORDER — DULOXETINE HCL 30 MG PO CPEP: 60.0000 mg | ORAL_CAPSULE | Freq: Every day | ORAL | 0 refills | Status: DC

## 2017-11-03 MED ORDER — CLOPIDOGREL BISULFATE 75 MG PO TABS: 75.0000 mg | ORAL_TABLET | Freq: Every day | ORAL | 1 refills | Status: DC

## 2017-11-03 MED ORDER — TRAMADOL HCL 50 MG PO TABS: ORAL_TABLET | ORAL | 2 refills | Status: DC

## 2017-11-03 MED ORDER — MONTELUKAST SODIUM 10 MG PO TABS: ORAL_TABLET | ORAL | 1 refills | Status: DC

## 2017-11-03 MED ORDER — TRAZODONE HCL 50 MG PO TABS: ORAL_TABLET | ORAL | 1 refills | Status: DC

## 2017-11-03 NOTE — Progress Notes (Signed)
Subjective:   Patient ID: Susan Patel, female    DOB: July 24, 1940, 77 y.o.   MRN: [id number] CC: Medical Management of Chronic Issues  HPI: Susan Patel is a 77 y.o. female   History of stroke: Continues to have some dysarthria.  Taking Plavix regularly.  Fibromyalgia: Has pain in her shoulders, arms, legs.  Takes tramadol twice a day with improvement in symptoms.    Elevated BMI: Goes with her husband to the gym several days a week.  Usually walks on the treadmill.    Chest pain: Recently she has had a couple episodes of chest pain with exertion.  Her heart rate gets up over 100, up to 110s she says she started feeling tightness in her chest and she had to stop.  She had some shortness of breath at this time as well.  Similar chest pressure  happened a second time when at home.,  She does not think she was exerting herself at that time.  Went away after about 5 to 10 minutes.  Diabetes: Has been taking glimepiride at home.  Avoiding sugary foods.  Hyperlipidemia: Tolerating statin, taking regularly   Insomnia: Taking trazodone 1 tab at night.  She has been sleeping fairly well recently.  Gait instability: Forgets to take big steps when moving backwards, has had more instability since her stroke.  Uses a walker at times at home, not always.  Does not have a walker with her today.  Relevant past medical, surgical, family and social history reviewed. Allergies and medications reviewed and updated. Social History   Tobacco Use  Smoking Status Never Smoker  Smokeless Tobacco Never Used   ROS: Per HPI   Objective:    BP 139/79   Pulse 77   Temp (!) 97 F (36.1 C) (Oral)   Ht '5\' 1"'$  (1.549 m)   Wt 204 lb 6.4 oz (92.7 kg)   BMI 38.62 kg/m   Wt Readings from Last 3 Encounters:  11/03/17 204 lb 6.4 oz (92.7 kg)  09/28/17 198 lb (89.8 kg)  08/17/17 207 lb (93.9 kg)    Gen: NAD, alert, cooperative with exam, NCAT EYES: EOMI, no conjunctival injection, or no icterus ENT:   OP  without erythema LYMPH: no cervical LAD CV: NRRR, normal S1/S2, no murmur, distal pulses 2+ b/l Resp: CTABL, no wheezes, normal WOB Abd: +BS, soft, NTND. no guarding or organomegaly Ext: No edema, warm Neuro: Alert and oriented, speech dysarthric MSK: normal muscle bulk  Assessment & Plan:  Susan Patel was seen today for medical management of chronic issues.  Diagnoses and all orders for this visit:  Diabetes mellitus without complication (HCC) E8B down to 6.3.  Will stop glimepiride.  Recheck A1c in 3 months.  If elevated will consider adding alternate agent that does not cause low blood sugars. -     Bayer DCA Hb A1c Waived  Fibromyalgia Stable, continue below. -     DULoxetine (CYMBALTA) 30 MG capsule; Take 2 capsules (60 mg total) by mouth daily.  Other chronic pain Has been on below for some time, efforts to wean and put on alternate medicines for pain control have not been successful.  Improving her pain control does help her get up and do more of her activities of daily living, including regular exercise.  We will continue to assess need for medicine, wean as able.  -     traMADol (ULTRAM) 50 MG tablet; TAKE 1 TABLET EVERY 12 HOURS AS NEEDED FOR MODERATE OR SEVERE PAIN  Hyperlipidemia associated with type 2 diabetes mellitus (HCC) Continue statin -     LDL Cholesterol, Direct -     Cholesterol, total -     HDL cholesterol  Late effects of CVA (cerebrovascular accident) -     clopidogrel (PLAVIX) 75 MG tablet; Take 1 tablet (75 mg total) by mouth daily.  Essential hypertension Stable, continue  current medicines -     lisinopril (PRINIVIL,ZESTRIL) 20 MG tablet; Take 2 tablets (40 mg total) by mouth daily. -     BMP8+EGFR  Insomnia, unspecified type Stable, continue below. -     traZODone (DESYREL) 50 MG tablet; TAKE 1 TABLET (50 MG TOTAL) BY MOUTH NIGHTLY AS NEEDED FOR FOR SLEEP.  Allergic rhinitis due to other allergic trigger, unspecified seasonality -     montelukast  (SINGULAIR) 10 MG tablet; TAKE 1 TABLET BY MOUTH EVERYDAY AT BEDTIME  Chest pain, unspecified type Chest pain with exertion when her heart rate gets up high enough.  Not currently on beta-blocker, does have history of stroke, on Plavix, statin, ACE inhibitor. -     Ambulatory referral to Cardiology  Follow up plan: Return in about 3 months (around 02/03/2018). Assunta Found, MD [place]

## 2017-11-10 ENCOUNTER — Ambulatory Visit: Payer: Medicare HMO | Admitting: Cardiovascular Disease

## 2017-11-10 ENCOUNTER — Encounter: Payer: Self-pay | Admitting: Cardiovascular Disease

## 2017-11-10 VITALS — BP 158/78 | HR 86 | Ht 61.0 in | Wt 204.0 lb

## 2017-11-10 DIAGNOSIS — R079 Chest pain, unspecified: Secondary | ICD-10-CM

## 2017-11-10 DIAGNOSIS — E785 Hyperlipidemia, unspecified: Secondary | ICD-10-CM | POA: Diagnosis not present

## 2017-11-10 DIAGNOSIS — Z8673 Personal history of transient ischemic attack (TIA), and cerebral infarction without residual deficits: Secondary | ICD-10-CM | POA: Diagnosis not present

## 2017-11-10 DIAGNOSIS — I10 Essential (primary) hypertension: Secondary | ICD-10-CM | POA: Diagnosis not present

## 2017-11-10 DIAGNOSIS — R9431 Abnormal electrocardiogram [ECG] [EKG]: Secondary | ICD-10-CM

## 2017-11-10 MED ORDER — CARVEDILOL 3.125 MG PO TABS: 3.1250 mg | ORAL_TABLET | Freq: Two times a day (BID) | ORAL | 3 refills | Status: DC

## 2017-11-10 NOTE — Progress Notes (Signed)
CARDIOLOGY CONSULT NOTE  Patient ID: Susan Patel MRN: [id number] DOB/AGE: 75-Oct-1942 75 y.o.  Admit date: (Not on file) Primary Physician: Eustaquio Maize, MD Referring Physician: Dr. Evette Doffing  Reason for Consultation: Chest pain  HPI: Susan Patel is a 77 y.o. female who is being seen today for the evaluation of chest pain at the request of Eustaquio Maize, MD.   Past medical history includes stroke, hyperlipidemia, hypertension, type 2 diabetes mellitus, and fibromyalgia.  I reviewed notes by her PCP.  She has been having exertional chest pain with heart rates getting into the 100-110 bpm range.  She has some associated exertional dyspnea as well.  Chest tightness has also occurred without exertion.  Symptoms subsided within 5 to 10 minutes.  I reviewed the echocardiogram performed on 10/10/2016 which demonstrated normal left ventricular systolic function and regional wall motion, LVEF 60 to 65%, mild concentric LVH, grade 1 diastolic dysfunction, and mild tricuspid regurgitation.  She said she has been having chest pains for weeks occurring both with and without exertion.  She says symptoms last seconds.  She has associated exertional dyspnea.  She denies palpitations, nausea, vomiting, lightheadedness, dizziness, and syncope.  She has chronic bilateral ankle and feet swelling.  She checks her blood pressure at home daily and systolic readings are in the 140-150 range.  It is 158/78 in our office.  I ordered and personally reviewed the ECG today which shows sinus rhythm with T wave inversions suggestive of inferior and anterolateral ischemia.  Family history: Mother and father died of CHF.  No history of MI.  Allergies  Allergen Reactions  . Azithromycin Other (See Comments)    Renal failure  . Prednisone Other (See Comments)    Irritability and insomnia  . Sulfamethoxazole-Trimethoprim Other (See Comments)    Renal failure  . Amlodipine     Caused swelling in  legs  . Ciprofloxacin Itching and Rash  . Tape Rash    Current Outpatient Medications  Medication Sig Dispense Refill  . albuterol (PROVENTIL HFA;VENTOLIN HFA) 108 (90 Base) MCG/ACT inhaler Inhale 1-2 puffs into the lungs every 6 (six) hours as needed for wheezing or shortness of breath. 1 Inhaler 1  . clopidogrel (PLAVIX) 75 MG tablet Take 1 tablet (75 mg total) by mouth daily. 90 tablet 1  . DULoxetine (CYMBALTA) 30 MG capsule Take 2 capsules (60 mg total) by mouth daily. 180 capsule 0  . famotidine (PEPCID) 20 MG tablet TAKE 1 TABLET (20 MG TOTAL) 2 (TWO) TIMES DAILY AS NEEDED BY MOUTH FOR HEARTBURN OR INDIGESTION. 180 tablet 0  . fluticasone (FLONASE) 50 MCG/ACT nasal spray Place 2 sprays into both nostrils daily. 48 g 1  . fluticasone furoate-vilanterol (BREO ELLIPTA) 200-25 MCG/INH AEPB Inhale 1 puff into the lungs daily. 28 each 4  . gabapentin (NEURONTIN) 300 MG capsule TAKE 1 CAPSULE BY MOUTH THREE TIMES A DAY 270 capsule 1  . glucose blood test strip Use as instructed 100 each 2  . Lancets (ONETOUCH ULTRASOFT) lancets Use as instructed 100 each 12  . latanoprost (XALATAN) 0.005 % ophthalmic solution Place 1 drop into both eyes at bedtime. 2.5 mL 12  . linaclotide (LINZESS) 290 MCG CAPS capsule Take 1 capsule (290 mcg total) by mouth daily before breakfast. 90 capsule 1  . lisinopril (PRINIVIL,ZESTRIL) 20 MG tablet Take 2 tablets (40 mg total) by mouth daily. 180 tablet 1  . montelukast (SINGULAIR) 10 MG tablet TAKE 1 TABLET BY MOUTH  EVERYDAY AT BEDTIME 90 tablet 1  . ONETOUCH DELICA LANCETS FINE MISC TEST TWICE DAILY 100 each 1  . OXYGEN Inhale 3-4 L into the lungs at bedtime.    . rosuvastatin (CRESTOR) 40 MG tablet Take 1 tablet (40 mg total) by mouth every evening. 90 tablet 1  . traMADol (ULTRAM) 50 MG tablet TAKE 1 TABLET EVERY 12 HOURS AS NEEDED FOR MODERATE OR SEVERE PAIN 60 tablet 2  . traZODone (DESYREL) 50 MG tablet TAKE 1 TABLET (50 MG TOTAL) BY MOUTH NIGHTLY AS NEEDED  FOR FOR SLEEP. 90 tablet 1  . Triamcinolone Acetonide (TRIAMCINOLONE 0.1 % CREAM : EUCERIN) CREA Apply 1 application topically 2 (two) times daily. 1 each 1   No current facility-administered medications for this visit.     Past Medical History:  Diagnosis Date  . Arthritis    'all over"  . Asthma   . Chronic pain   . COPD (chronic obstructive pulmonary disease) (HCC)    O2 per [facility] at nights   . Depression   . Diabetes mellitus without complication ([place])    borderline- , states she was on med., but MD told her "everything is under control so I threw the bottle away"  . Fibromyalgia   . GERD (gastroesophageal reflux disease)   . Hypertension   . Neuromuscular disorder (HCC)    parkinson, neuropathy- both feet & hands.  . Stroke [facility])    residual dysarthria    Past Surgical History:  Procedure Laterality Date  . ABDOMINAL HYSTERECTOMY    . ANTERIOR CERVICAL DECOMP/DISCECTOMY FUSION N/A 07/10/2015   Procedure: Cervical five-six, Cervical six-seven anterior cervical decompression with fusion plating and bonegraft;  Surgeon: Jovita Gamma, MD;  Location: [place] [facility];  Service: Neurosurgery;  Laterality: N/A;  . APPENDECTOMY    . BIOPSY EYE MUSCLE  03/20/2016   biopsy vessel to right eye due to swelling  . EYE SURGERY Bilateral    cataracts removed, /w "cyrstal lenses"   . SHOULDER ARTHROSCOPY Right    x2   RCR- spurs removed     Social History   Socioeconomic History  . Marital status: Married    Spouse name: wayne  . Number of children: 2  . Years of education: 16  . Highest education level: Bachelor's degree (e.g., BA, AB, BS)  Occupational History  . Occupation: retired    Comment: Automotive engineer  Social Needs  . Financial resource strain: Not hard at all  . Food insecurity:    Worry: Never true    Inability: Never true  . Transportation needs:    Medical: No    Non-medical: No  Tobacco Use  . Smoking status: Never Smoker  . Smokeless tobacco:  Never Used  Substance and Sexual Activity  . Alcohol use: No  . Drug use: No  . Sexual activity: Yes  Lifestyle  . Physical activity:    Days per week: 0 days    Minutes per session: 0 min  . Stress: Not at all  Relationships  . Social connections:    Talks on phone: More than three times a week    Gets together: More than three times a week    Attends religious service: More than 4 times per year    Active member of club or organization: Yes    Attends meetings of clubs or organizations: More than 4 times per year    Relationship status: Married  . Intimate partner violence:    Fear of current  or ex partner: Not on file    Emotionally abused: Not on file    Physically abused: Not on file    Forced sexual activity: Not on file  Other Topics Concern  . Not on file  Social History Narrative  . Not on file      Current Meds  Medication Sig  . albuterol (PROVENTIL HFA;VENTOLIN HFA) 108 (90 Base) MCG/ACT inhaler Inhale 1-2 puffs into the lungs every 6 (six) hours as needed for wheezing or shortness of breath.  . clopidogrel (PLAVIX) 75 MG tablet Take 1 tablet (75 mg total) by mouth daily.  . DULoxetine (CYMBALTA) 30 MG capsule Take 2 capsules (60 mg total) by mouth daily.  . famotidine (PEPCID) 20 MG tablet TAKE 1 TABLET (20 MG TOTAL) 2 (TWO) TIMES DAILY AS NEEDED BY MOUTH FOR HEARTBURN OR INDIGESTION.  . fluticasone (FLONASE) 50 MCG/ACT nasal spray Place 2 sprays into both nostrils daily.  . fluticasone furoate-vilanterol (BREO ELLIPTA) 200-25 MCG/INH AEPB Inhale 1 puff into the lungs daily.  Marland Kitchen gabapentin (NEURONTIN) 300 MG capsule TAKE 1 CAPSULE BY MOUTH THREE TIMES A DAY  . glucose blood test strip Use as instructed  . Lancets (ONETOUCH ULTRASOFT) lancets Use as instructed  . latanoprost (XALATAN) 0.005 % ophthalmic solution Place 1 drop into both eyes at bedtime.  Marland Kitchen linaclotide (LINZESS) 290 MCG CAPS capsule Take 1 capsule (290 mcg total) by mouth daily before breakfast.  .  lisinopril (PRINIVIL,ZESTRIL) 20 MG tablet Take 2 tablets (40 mg total) by mouth daily.  . montelukast (SINGULAIR) 10 MG tablet TAKE 1 TABLET BY MOUTH EVERYDAY AT BEDTIME  . ONETOUCH DELICA LANCETS FINE MISC TEST TWICE DAILY  . OXYGEN Inhale 3-4 L into the lungs at bedtime.  . rosuvastatin (CRESTOR) 40 MG tablet Take 1 tablet (40 mg total) by mouth every evening.  . traMADol (ULTRAM) 50 MG tablet TAKE 1 TABLET EVERY 12 HOURS AS NEEDED FOR MODERATE OR SEVERE PAIN  . traZODone (DESYREL) 50 MG tablet TAKE 1 TABLET (50 MG TOTAL) BY MOUTH NIGHTLY AS NEEDED FOR FOR SLEEP.  . Triamcinolone Acetonide (TRIAMCINOLONE 0.1 % CREAM : EUCERIN) CREA Apply 1 application topically 2 (two) times daily.      Review of systems complete and found to be negative unless listed above in HPI    Physical exam Blood pressure (!) 158/78, pulse 86, height 5\' 1"  (1.549 m), weight 204 lb (92.5 kg), SpO2 95 %. General: NAD Neck: No JVD, no thyromegaly or thyroid nodule.  Lungs: Clear to auscultation bilaterally with normal respiratory effort. CV: Nondisplaced PMI. Regular rate and rhythm, normal S1/S2, no S3/S4, no murmur.  No peripheral edema.  No carotid bruit.    Abdomen: Soft, nontender, no distention.  Skin: Intact without lesions or rashes.  Neurologic: Alert and oriented x 3.  Dysarthric. Psych: Normal affect. Extremities: No clubbing or cyanosis.  HEENT: Normal.   ECG: Most recent ECG reviewed above in HPI.   Labs: Lab Results  Component Value Date/Time   K 5.0 03/23/2017 03:53 PM   BUN 22 03/23/2017 03:53 PM   CREATININE 0.96 03/23/2017 03:53 PM   ALT 10 03/23/2017 03:53 PM   TSH 3.699 03/22/2016 06:31 AM   HGB 11.7 (L) 10/18/2016 07:20 AM   HGB 12.4 11/06/2015 12:57 PM     Lipids: Lab Results  Component Value Date/Time   LDLCALC 163 (H) 05/03/2017 11:15 AM   CHOL 255 (H) 05/03/2017 11:15 AM   TRIG 165 (H) 05/03/2017 11:15 AM  HDL 59 05/03/2017 11:15 AM        ASSESSMENT AND PLAN:    1.  Chest pain and shortness of breath: She has multiple cardiovascular risk factors as noted above.  ECG is markedly abnormal suggestive of inferior and anterolateral ischemia. I will start carvedilol 3.125 mg twice daily both for symptom relief and blood pressure control. I will proceed with coronary CT angiography and FFR if needed.  2.  Hypertension: Blood pressure is markedly elevated.  She has a history of stroke.  She is on lisinopril 40 mg daily.  Systolic readings at home are in the 140-150 range. Given her history of chest pain as well, I will start carvedilol 3.125 mg twice daily.  3.  Hyperlipidemia: Currently on Crestor 40 mg.  She is due for repeat lipids.  I reviewed the lipid panel from 05/03/17 which showed LDL 163, triglycerides 165, and total cholesterol 255.  4.  History of stroke: Currently on Plavix, Crestor, and lisinopril.  Blood pressure needs better control.  As she also has chest pain, I will start carvedilol 3.125 mg twice daily.   Disposition: Follow up in 4-6 weeks   Signed: Kate Sable, M.D., F.A.C.C.  11/10/2017, 4:14 PM

## 2017-11-10 NOTE — Patient Instructions (Addendum)
Your physician recommends that you schedule a follow-up appointment in: 6 weeks with Dr.Koneswaran   START Coreg 3.125 mg twice a day  Please arrive at the [facility] main entrance of [facility] at xx:xx AM (30-45 minutes prior to test start time)  [facility] [place], [place] [address] [phone]  Proceed to the [facility] Radiology Department (First Floor).  Please follow these instructions carefully (unless otherwise directed):   On the Night Before the Test: . Drink plenty of water. . Do not consume any caffeinated/decaffeinated beverages or chocolate 12 hours prior to your test. . Do not take any antihistamines 12 hours prior to your test. . If you take Metformin do not take 24 hours prior to test.  On the Day of the Test: . Drink plenty of water. Do not drink any water within one hour of the test. . Do not eat any food 4 hours prior to the test. . You may take your regular medications prior to the test. . IF NOT ON A BETA BLOCKER - Take 50 mg of lopressor (metoprolol) one hour before the test. . HOLD Furosemide morning of the test.  After the Test: . Drink plenty of water. . After receiving IV contrast, you may experience a mild flushed feeling. This is normal. . On occasion, you may experience a mild rash up to 24 hours after the test. This is not dangerous. If this occurs, you can take Benadryl 25 mg and increase your fluid intake. . If you experience trouble breathing, this can be serious. If it is severe call 911 IMMEDIATELY. If it is mild, please call our office. . If you take any of these medications: Glipizide/Metformin, Avandament, Glucavance, please do not take 48 hours after completing test.

## 2017-11-22 DIAGNOSIS — R490 Dysphonia: Secondary | ICD-10-CM | POA: Diagnosis not present

## 2017-11-22 DIAGNOSIS — J383 Other diseases of vocal cords: Secondary | ICD-10-CM | POA: Diagnosis not present

## 2017-11-22 DIAGNOSIS — G20 Parkinson's disease: Secondary | ICD-10-CM | POA: Diagnosis not present

## 2017-11-28 ENCOUNTER — Other Ambulatory Visit: Payer: Self-pay | Admitting: Pediatrics

## 2017-11-28 DIAGNOSIS — K219 Gastro-esophageal reflux disease without esophagitis: Secondary | ICD-10-CM

## 2017-12-13 ENCOUNTER — Other Ambulatory Visit: Payer: Self-pay | Admitting: Pediatrics

## 2017-12-14 ENCOUNTER — Other Ambulatory Visit: Payer: Self-pay | Admitting: Pediatrics

## 2017-12-20 ENCOUNTER — Telehealth: Payer: Self-pay

## 2017-12-20 DIAGNOSIS — Z01818 Encounter for other preprocedural examination: Secondary | ICD-10-CM

## 2017-12-20 NOTE — Telephone Encounter (Signed)
Lab for bmet placed, at front desk, unable to reach pt, line busy after several tries.

## 2017-12-21 ENCOUNTER — Ambulatory Visit: Payer: Medicare HMO | Admitting: Cardiovascular Disease

## 2017-12-22 DIAGNOSIS — R490 Dysphonia: Secondary | ICD-10-CM | POA: Diagnosis not present

## 2017-12-22 DIAGNOSIS — I6990 Unspecified sequelae of unspecified cerebrovascular disease: Secondary | ICD-10-CM | POA: Diagnosis not present

## 2017-12-22 DIAGNOSIS — G20 Parkinson's disease: Secondary | ICD-10-CM | POA: Diagnosis not present

## 2017-12-23 ENCOUNTER — Other Ambulatory Visit: Payer: Self-pay | Admitting: Pediatrics

## 2017-12-28 DIAGNOSIS — G20 Parkinson's disease: Secondary | ICD-10-CM | POA: Diagnosis not present

## 2017-12-28 DIAGNOSIS — I6990 Unspecified sequelae of unspecified cerebrovascular disease: Secondary | ICD-10-CM | POA: Diagnosis not present

## 2017-12-28 DIAGNOSIS — R490 Dysphonia: Secondary | ICD-10-CM | POA: Diagnosis not present

## 2018-01-04 DIAGNOSIS — M1711 Unilateral primary osteoarthritis, right knee: Secondary | ICD-10-CM | POA: Diagnosis not present

## 2018-01-04 DIAGNOSIS — M1611 Unilateral primary osteoarthritis, right hip: Secondary | ICD-10-CM | POA: Diagnosis not present

## 2018-01-04 DIAGNOSIS — Z9181 History of falling: Secondary | ICD-10-CM | POA: Diagnosis not present

## 2018-01-09 DIAGNOSIS — R69 Illness, unspecified: Secondary | ICD-10-CM | POA: Diagnosis not present

## 2018-01-10 ENCOUNTER — Ambulatory Visit (HOSPITAL_COMMUNITY)
Admission: RE | Admit: 2018-01-10 | Discharge: 2018-01-10 | Disposition: A | Discharge status: Home / Self Care | Payer: Medicare HMO | Source: Ambulatory Visit | Attending: Cardiovascular Disease | Admitting: Cardiovascular Disease

## 2018-01-10 DIAGNOSIS — R079 Chest pain, unspecified: Secondary | ICD-10-CM

## 2018-01-10 DIAGNOSIS — I2510 Atherosclerotic heart disease of native coronary artery without angina pectoris: Secondary | ICD-10-CM | POA: Diagnosis not present

## 2018-01-10 MED ORDER — IOPAMIDOL (ISOVUE-370) INJECTION 76%
INTRAVENOUS | Status: AC
  Filled 2018-01-10: qty 100

## 2018-01-10 MED ORDER — IOPAMIDOL (ISOVUE-370) INJECTION 76%
100.0000 mL | Freq: Once | INTRAVENOUS | Status: AC | PRN
  Administered 2018-01-10: 100 mL via INTRAVENOUS

## 2018-01-10 MED ORDER — NITROGLYCERIN 0.4 MG SL SUBL
SUBLINGUAL_TABLET | SUBLINGUAL | Status: AC
  Administered 2018-01-10: 0.8 mg
  Filled 2018-01-10: qty 1

## 2018-01-10 MED ORDER — METOPROLOL TARTRATE 5 MG/5ML IV SOLN
INTRAVENOUS | Status: AC
  Administered 2018-01-10 (×2): 10 mg
  Filled 2018-01-10: qty 20

## 2018-01-11 ENCOUNTER — Telehealth: Payer: Self-pay

## 2018-01-11 NOTE — Telephone Encounter (Signed)
-----   Message from Herminio Commons, MD sent at 01/11/2018  9:26 AM EDT ----- Several pulmonary nodules. Cardiac findings already addressed. Please send copy to PCP. Will need follow up surveillance imaging.

## 2018-01-12 ENCOUNTER — Telehealth: Payer: Self-pay

## 2018-01-12 MED ORDER — NITROGLYCERIN 0.4 MG SL SUBL: 0.4000 mg | SUBLINGUAL_TABLET | SUBLINGUAL | 3 refills | Status: DC | PRN

## 2018-01-12 NOTE — Telephone Encounter (Signed)
Schedule her to see APP within the next few weeks.

## 2018-01-12 NOTE — Telephone Encounter (Signed)
Ok. That's fine.

## 2018-01-12 NOTE — Telephone Encounter (Signed)
Next available APP apt is on same day you see patient  (02/14/18)

## 2018-01-12 NOTE — Telephone Encounter (Signed)
-----   Message from Orinda Kenner sent at 01/12/2018 10:26 AM EDT ----- Regarding: RE: needs apt Patient states that she will not be home to come to appointment on 9/13.  Coralyn Mark ----- Message ----- From: Bernita Raisin, RN Sent: 01/12/2018   7:31 AM EDT To: Manson Passey Subject: needs apt                                      Herminio Commons, MD  Bernita Raisin, RN    Schedule to see me at 3:40 on 01/27/18. Call her to see how she's feeling since I started Coreg. Prescribe SL nitro prn. She appears to have blockages. I will discuss at ov.

## 2018-01-12 NOTE — Telephone Encounter (Signed)
I spoke with patient and her family, they are leaving today to go out of town and will not be back until after 9/15, so they declined your apt on 9/13

## 2018-01-13 ENCOUNTER — Other Ambulatory Visit: Payer: Self-pay

## 2018-01-13 DIAGNOSIS — I6523 Occlusion and stenosis of bilateral carotid arteries: Secondary | ICD-10-CM

## 2018-01-20 ENCOUNTER — Ambulatory Visit (HOSPITAL_COMMUNITY): Payer: BC Managed Care – PPO

## 2018-01-23 LAB — POCT I-STAT CREATININE: Creatinine, Ser: 0.9 mg/dL (ref 0.44–1.00)

## 2018-01-27 ENCOUNTER — Telehealth: Payer: Self-pay | Admitting: Pediatrics

## 2018-01-27 ENCOUNTER — Telehealth: Payer: Self-pay | Admitting: *Deleted

## 2018-01-27 ENCOUNTER — Ambulatory Visit: Payer: BC Managed Care – PPO | Admitting: Cardiovascular Disease

## 2018-01-27 ENCOUNTER — Other Ambulatory Visit: Payer: Self-pay | Admitting: Family Medicine

## 2018-01-27 DIAGNOSIS — R238 Other skin changes: Secondary | ICD-10-CM

## 2018-01-27 DIAGNOSIS — G4700 Insomnia, unspecified: Secondary | ICD-10-CM

## 2018-01-27 MED ORDER — TOLTERODINE TARTRATE 1 MG PO TABS: 1.0000 mg | ORAL_TABLET | Freq: Two times a day (BID) | ORAL | 1 refills | Status: DC

## 2018-01-27 NOTE — Telephone Encounter (Signed)
I sent in the requested prescription 

## 2018-01-27 NOTE — Telephone Encounter (Signed)
Patient husband aware and verbalized understanding. 

## 2018-01-28 ENCOUNTER — Ambulatory Visit (INDEPENDENT_AMBULATORY_CARE_PROVIDER_SITE_OTHER): Payer: Medicare HMO | Admitting: Family

## 2018-01-28 ENCOUNTER — Encounter: Payer: Self-pay | Admitting: Family

## 2018-01-28 VITALS — BP 143/76 | HR 75 | Temp 97.1°F | Ht 61.0 in | Wt 206.4 lb

## 2018-01-28 DIAGNOSIS — R059 Cough, unspecified: Secondary | ICD-10-CM

## 2018-01-28 DIAGNOSIS — R05 Cough: Secondary | ICD-10-CM

## 2018-01-28 DIAGNOSIS — J45909 Unspecified asthma, uncomplicated: Secondary | ICD-10-CM | POA: Diagnosis not present

## 2018-01-28 MED ORDER — BENZONATATE 200 MG PO CAPS: 200.0000 mg | ORAL_CAPSULE | Freq: Three times a day (TID) | ORAL | 1 refills | Status: DC | PRN

## 2018-01-28 MED ORDER — FLUTICASONE PROPIONATE 50 MCG/ACT NA SUSP: 2.0000 | Freq: Every day | NASAL | 1 refills | Status: DC

## 2018-01-28 NOTE — Patient Instructions (Signed)

## 2018-01-28 NOTE — Progress Notes (Signed)
   Subjective:    Patient ID: Susan Patel, female    DOB: 11/26/1940, 77 y.o.   MRN: [id number]  Chief Complaint  Patient presents with  . Cough    x 1 week-   . Nasal Congestion  . Shortness of Breath    Cough  This is a new problem. The current episode started 1 to 4 weeks ago. The problem has been unchanged. The problem occurs every few minutes. The cough is non-productive. Associated symptoms include chills, headaches, myalgias, nasal congestion, postnasal drip, rhinorrhea, a sore throat and wheezing. Pertinent negatives include no ear congestion, ear pain or fever. The symptoms are aggravated by lying down. She has tried rest for the symptoms. The treatment provided no relief. Her past medical history is significant for asthma.      Review of Systems  Constitutional: Positive for chills. Negative for fever.  HENT: Positive for postnasal drip, rhinorrhea and sore throat. Negative for ear pain.   Respiratory: Positive for cough and wheezing.   Musculoskeletal: Positive for myalgias.  Neurological: Positive for headaches.  All other systems reviewed and are negative.      Objective:   Physical Exam  Constitutional: She is oriented to person, place, and time. She appears well-developed and well-nourished. She appears lethargic. No distress.  HENT:  Right Ear: External ear normal.  Cardiovascular: Normal rate, regular rhythm, normal heart sounds and intact distal pulses.  No murmur heard. Pulmonary/Chest: Effort normal and breath sounds normal. No respiratory distress. She has no wheezes.  Abdominal: Soft. Bowel sounds are normal. She exhibits no distension. There is no tenderness.  Musculoskeletal: Normal range of motion. She exhibits no edema or tenderness.  Neurological: She is oriented to person, place, and time. She has normal reflexes. She appears lethargic. No cranial nerve deficit.  Skin: Skin is warm and dry.  Psychiatric: Her behavior is normal. Judgment and  thought content normal.  Flat affect  Vitals reviewed.     BP (!) 143/76   Pulse 75   Temp (!) 97.1 F (36.2 C) (Oral)   Ht 5\' 1"  (1.549 m)   Wt 206 lb 6.4 oz (93.6 kg)   SpO2 96%   BMI 39.00 kg/m      Assessment & Plan:  Susan Patel comes in today with chief complaint of Cough (x 1 week- ); Nasal Congestion; and Shortness of Breath   Diagnosis and orders addressed:  1. Cough Continue Breo, Flonase, and Singulair -Will give tessalon Lungs sound good on exam, if cough worsens or does not improve told patient and husband to call office and I would send in antibiotic  Start Mucinex  - benzonatate (TESSALON) 200 MG capsule; Take 1 capsule (200 mg total) by mouth 3 (three) times daily as needed.  Dispense: 30 capsule; Refill: 1 - fluticasone (FLONASE) 50 MCG/ACT nasal spray; Place 2 sprays into both nostrils daily.  Dispense: 48 g; Refill: 1  2. Uncomplicated asthma, unspecified asthma severity, unspecified whether persistent    Evelina Dun, FNP

## 2018-01-30 DIAGNOSIS — I6990 Unspecified sequelae of unspecified cerebrovascular disease: Secondary | ICD-10-CM | POA: Diagnosis not present

## 2018-01-30 DIAGNOSIS — G20 Parkinson's disease: Secondary | ICD-10-CM | POA: Diagnosis not present

## 2018-01-30 DIAGNOSIS — R490 Dysphonia: Secondary | ICD-10-CM | POA: Diagnosis not present

## 2018-02-01 DIAGNOSIS — I6990 Unspecified sequelae of unspecified cerebrovascular disease: Secondary | ICD-10-CM | POA: Diagnosis not present

## 2018-02-01 DIAGNOSIS — G20 Parkinson's disease: Secondary | ICD-10-CM | POA: Diagnosis not present

## 2018-02-01 DIAGNOSIS — R490 Dysphonia: Secondary | ICD-10-CM | POA: Diagnosis not present

## 2018-02-06 DIAGNOSIS — G20 Parkinson's disease: Secondary | ICD-10-CM | POA: Diagnosis not present

## 2018-02-06 DIAGNOSIS — R490 Dysphonia: Secondary | ICD-10-CM | POA: Diagnosis not present

## 2018-02-06 DIAGNOSIS — I6990 Unspecified sequelae of unspecified cerebrovascular disease: Secondary | ICD-10-CM | POA: Diagnosis not present

## 2018-02-07 ENCOUNTER — Encounter: Payer: Self-pay | Admitting: Family

## 2018-02-07 ENCOUNTER — Telehealth: Payer: Self-pay | Admitting: Pediatrics

## 2018-02-07 ENCOUNTER — Ambulatory Visit (INDEPENDENT_AMBULATORY_CARE_PROVIDER_SITE_OTHER): Payer: Medicare HMO | Admitting: Family

## 2018-02-07 VITALS — BP 177/96 | HR 67 | Temp 97.0°F | Ht 61.0 in | Wt 202.2 lb

## 2018-02-07 DIAGNOSIS — J208 Acute bronchitis due to other specified organisms: Secondary | ICD-10-CM

## 2018-02-07 DIAGNOSIS — B9689 Other specified bacterial agents as the cause of diseases classified elsewhere: Secondary | ICD-10-CM

## 2018-02-07 MED ORDER — DOXYCYCLINE HYCLATE 100 MG PO TABS: 100.0000 mg | ORAL_TABLET | Freq: Two times a day (BID) | ORAL | 0 refills | Status: DC

## 2018-02-07 NOTE — Patient Instructions (Signed)

## 2018-02-07 NOTE — Progress Notes (Signed)
   Subjective:    Patient ID: Susan Patel, female    DOB: Apr 18, 1941, 77 y.o.   MRN: [id number]  Chief Complaint  Patient presents with  . cough is worse    Cough  This is a recurrent problem. The current episode started 1 to 4 weeks ago. The problem has been waxing and waning. The problem occurs every few minutes. The cough is productive of sputum. Associated symptoms include chills, a fever, headaches, myalgias, nasal congestion, shortness of breath and wheezing. Pertinent negatives include no ear congestion, ear pain or sore throat. She has tried rest and OTC cough suppressant for the symptoms. The treatment provided mild relief.      Review of Systems  Constitutional: Positive for chills and fever.  HENT: Negative for ear pain and sore throat.   Respiratory: Positive for cough, shortness of breath and wheezing.   Musculoskeletal: Positive for myalgias.  Neurological: Positive for headaches.  All other systems reviewed and are negative.      Objective:   Physical Exam  Constitutional: She is oriented to person, place, and time. She appears well-developed and well-nourished. No distress.  HENT:  Head: Normocephalic and atraumatic.  Right Ear: External ear normal.  Left Ear: External ear normal.  Mouth/Throat: Posterior oropharyngeal erythema present.  Eyes: Pupils are equal, round, and reactive to light.  Neck: Normal range of motion. Neck supple. No thyromegaly present.  Cardiovascular: Normal rate, regular rhythm, normal heart sounds and intact distal pulses.  No murmur heard. Pulmonary/Chest: Effort normal. No respiratory distress. She has no wheezes. She has rhonchi.  Abdominal: Soft. Bowel sounds are normal. She exhibits no distension. There is no tenderness.  Musculoskeletal: Normal range of motion. She exhibits no edema or tenderness.  Neurological: She is alert and oriented to person, place, and time. A cranial nerve deficit is present.  Skin: Skin is warm and  dry.  Psychiatric: Her behavior is normal. Judgment and thought content normal.  Flat affect   Vitals reviewed.     BP (!) 192/98   Pulse 67   Temp (!) 97 F (36.1 C) (Oral)   Ht 5\' 1"  (1.549 m)   Wt 202 lb 3.2 oz (91.7 kg)   BMI 38.21 kg/m      Assessment & Plan:  Susan Patel comes in today with chief complaint of cough is worse   Diagnosis and orders addressed:  1. Acute bacterial bronchitis - Take meds as prescribed - Use a cool mist humidifier  -Use saline nose sprays frequently -Force fluids -For any cough or congestion  Use plain Mucinex- regular strength or max strength is fine -For fever or aces or pains- take tylenol or ibuprofen. -Throat lozenges if help -New toothbrush in 3 days RTO if symptoms worsen or do not improve  - doxycycline (VIBRA-TABS) 100 MG tablet; Take 1 tablet (100 mg total) by mouth 2 (two) times daily.  Dispense: 20 tablet; Refill: 0   Evelina Dun, FNP

## 2018-02-07 NOTE — Telephone Encounter (Signed)
Patient aware and appt made 

## 2018-02-07 NOTE — Telephone Encounter (Signed)
Needs to be seen, is she sick? Want to make sure she doesn't have pneumonia or wheezing.

## 2018-02-08 DIAGNOSIS — G20 Parkinson's disease: Secondary | ICD-10-CM | POA: Diagnosis not present

## 2018-02-08 DIAGNOSIS — R490 Dysphonia: Secondary | ICD-10-CM | POA: Diagnosis not present

## 2018-02-08 DIAGNOSIS — I6990 Unspecified sequelae of unspecified cerebrovascular disease: Secondary | ICD-10-CM | POA: Diagnosis not present

## 2018-02-14 ENCOUNTER — Ambulatory Visit: Payer: Medicare HMO | Admitting: Cardiovascular Disease

## 2018-02-16 DIAGNOSIS — I6990 Unspecified sequelae of unspecified cerebrovascular disease: Secondary | ICD-10-CM | POA: Diagnosis not present

## 2018-02-16 DIAGNOSIS — R41841 Cognitive communication deficit: Secondary | ICD-10-CM | POA: Diagnosis not present

## 2018-02-17 ENCOUNTER — Ambulatory Visit: Payer: Medicare HMO | Admitting: Cardiovascular Disease

## 2018-02-17 ENCOUNTER — Ambulatory Visit: Payer: Medicare HMO | Admitting: Pediatrics

## 2018-02-20 DIAGNOSIS — R41841 Cognitive communication deficit: Secondary | ICD-10-CM | POA: Diagnosis not present

## 2018-02-20 DIAGNOSIS — I6990 Unspecified sequelae of unspecified cerebrovascular disease: Secondary | ICD-10-CM | POA: Diagnosis not present

## 2018-02-22 DIAGNOSIS — R41841 Cognitive communication deficit: Secondary | ICD-10-CM | POA: Diagnosis not present

## 2018-02-22 DIAGNOSIS — I6990 Unspecified sequelae of unspecified cerebrovascular disease: Secondary | ICD-10-CM | POA: Diagnosis not present

## 2018-02-23 ENCOUNTER — Other Ambulatory Visit: Payer: Self-pay

## 2018-02-23 ENCOUNTER — Encounter (HOSPITAL_COMMUNITY): Payer: Self-pay

## 2018-02-23 ENCOUNTER — Emergency Department (HOSPITAL_COMMUNITY)
Admission: EM | Admit: 2018-02-23 | Discharge: 2018-02-23 | Disposition: A | Discharge status: Home / Self Care | Payer: Medicare HMO | Attending: Emergency Medicine | Admitting: Emergency Medicine

## 2018-02-23 ENCOUNTER — Emergency Department (HOSPITAL_COMMUNITY): Payer: Medicare HMO

## 2018-02-23 ENCOUNTER — Other Ambulatory Visit: Payer: Self-pay | Admitting: *Deleted

## 2018-02-23 DIAGNOSIS — Y999 Unspecified external cause status: Secondary | ICD-10-CM | POA: Insufficient documentation

## 2018-02-23 DIAGNOSIS — Y939 Activity, unspecified: Secondary | ICD-10-CM | POA: Diagnosis not present

## 2018-02-23 DIAGNOSIS — I10 Essential (primary) hypertension: Secondary | ICD-10-CM | POA: Insufficient documentation

## 2018-02-23 DIAGNOSIS — Z7901 Long term (current) use of anticoagulants: Secondary | ICD-10-CM | POA: Diagnosis not present

## 2018-02-23 DIAGNOSIS — Z8673 Personal history of transient ischemic attack (TIA), and cerebral infarction without residual deficits: Secondary | ICD-10-CM | POA: Insufficient documentation

## 2018-02-23 DIAGNOSIS — S7002XA Contusion of left hip, initial encounter: Secondary | ICD-10-CM | POA: Diagnosis not present

## 2018-02-23 DIAGNOSIS — J449 Chronic obstructive pulmonary disease, unspecified: Secondary | ICD-10-CM | POA: Diagnosis not present

## 2018-02-23 DIAGNOSIS — Z79899 Other long term (current) drug therapy: Secondary | ICD-10-CM | POA: Insufficient documentation

## 2018-02-23 DIAGNOSIS — T148XXA Other injury of unspecified body region, initial encounter: Secondary | ICD-10-CM | POA: Diagnosis not present

## 2018-02-23 DIAGNOSIS — Y929 Unspecified place or not applicable: Secondary | ICD-10-CM | POA: Diagnosis not present

## 2018-02-23 DIAGNOSIS — E119 Type 2 diabetes mellitus without complications: Secondary | ICD-10-CM | POA: Diagnosis not present

## 2018-02-23 DIAGNOSIS — M25552 Pain in left hip: Secondary | ICD-10-CM | POA: Diagnosis not present

## 2018-02-23 DIAGNOSIS — W19XXXA Unspecified fall, initial encounter: Secondary | ICD-10-CM | POA: Insufficient documentation

## 2018-02-23 DIAGNOSIS — S79912A Unspecified injury of left hip, initial encounter: Secondary | ICD-10-CM | POA: Diagnosis not present

## 2018-02-23 MED ORDER — FLUTICASONE FUROATE-VILANTEROL 200-25 MCG/INH IN AEPB: 1.0000 | INHALATION_SPRAY | Freq: Every day | RESPIRATORY_TRACT | 2 refills | Status: DC

## 2018-02-23 MED ORDER — LIDOCAINE 4 % EX PTCH: 1.0000 | MEDICATED_PATCH | Freq: Two times a day (BID) | CUTANEOUS | 0 refills | Status: DC

## 2018-02-23 MED ORDER — ACETAMINOPHEN 325 MG PO TABS: 650.0000 mg | ORAL_TABLET | Freq: Four times a day (QID) | ORAL | 0 refills | Status: DC | PRN

## 2018-02-23 MED ORDER — OXYCODONE-ACETAMINOPHEN 5-325 MG PO TABS
1.0000 | ORAL_TABLET | ORAL | Status: DC | PRN
  Administered 2018-02-23: 1 via ORAL
  Filled 2018-02-23: qty 1

## 2018-02-23 MED ORDER — HYDROCODONE-ACETAMINOPHEN 5-325 MG PO TABS: 1.0000 | ORAL_TABLET | Freq: Two times a day (BID) | ORAL | 0 refills | Status: AC | PRN

## 2018-02-23 NOTE — ED Notes (Signed)
Patient verbalizes understanding of discharge instructions. Opportunity for questioning and answers were provided. Armband removed by staff, pt discharged from ED in wheelchair.  

## 2018-02-23 NOTE — ED Notes (Signed)
Pt ambulates with walker in hallway. Pt needs no assistance in sitting. Sore with sitting back into bed, but strong/steady on feet.

## 2018-02-23 NOTE — ED Triage Notes (Signed)
Pt fell yesterday afternoon, did not pass out.  Complains of left hip pain.  Pt A&Ox4. Pt able to walk but in pain when walking.

## 2018-02-23 NOTE — Discharge Instructions (Signed)
We saw in the ER with chief complaint of fall. The x-ray did not show any fractures. We suspect that your pain is because of high-grade contusion.  Please take the narcotic pain medications only when the pain is severe. Otherwise take Tylenol for pain. We have requested the home health team to contact you, expect to phone call tomorrow.

## 2018-02-24 ENCOUNTER — Ambulatory Visit: Payer: Medicare HMO | Admitting: Pediatrics

## 2018-02-24 NOTE — ED Provider Notes (Signed)
[facility] EMERGENCY DEPARTMENT Provider Note   CSN: [id number] Arrival date & time: 02/23/18  1800     History   Chief Complaint Chief Complaint  Patient presents with  . Fall  . Hip Pain    HPI Susan Patel is a 77 y.o. female.  HPI  77 year old female comes in with chief complaint of fall.  Patient had a fall yesterday afternoon.  Since then she is been having left hip pain.  Patient has been able to ambulate, however pain is severe and she has had worsening pain with ambulation.  Patient denies any nausea, vomiting, fevers, chills.  She also denies any numbness, tingling.  Past Medical History:  Diagnosis Date  . Arthritis    'all over"  . Asthma   . Chronic pain   . COPD (chronic obstructive pulmonary disease) (HCC)    O2 per [place] at nights   . Depression   . Diabetes mellitus without complication ([place])    borderline- , states she was on med., but MD told her "everything is under control so I threw the bottle away"  . Fibromyalgia   . GERD (gastroesophageal reflux disease)   . Hypertension   . Neuromuscular disorder (HCC)    parkinson, neuropathy- both feet & hands.  . Stroke [facility])    residual dysarthria    Patient Active Problem List   Diagnosis Date Noted  . 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 History of stroke 04/04/2017  . 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 Contusion of left elbow 03/15/2017  . Dysmetria   . Diabetes mellitus type 2 in nonobese (HCC)   . Supplemental oxygen dependent   . Benign essential HTN   . Hypokalemia   . Hypoalbuminemia due to protein-calorie malnutrition ([place])   . Embolic stroke of right basal ganglia ([place]) 10/12/2016  . Oropharyngeal dysphagia   . Stroke ([place]) 10/09/2016  . Stroke (cerebrum) ([place]) 10/09/2016  . 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 Frequent falls 04/21/2016  . 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 Type 2 diabetes  mellitus with peripheral neuropathy (HCC)   . Fibromyalgia   . Chronic pain syndrome   . Chronic obstructive pulmonary disease ([place])   . Gait disturbance, post-stroke   . Dysarthria, post-stroke   . Dysphagia, post-stroke   . AKI (acute kidney injury) ([place])   . Acute blood loss anemia   . 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 Mixed hyperlipidemia   . 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  . Allergic rhinitis 12/17/2015  . Primary osteoarthritis of both knees 11/19/2015  . Obesity (BMI 30-39.9) 09/04/2015  . Essential hypertension 09/04/2015  . Constipation 09/04/2015  . Asthma 08/26/2015  . Raynaud's phenomenon 08/26/2015  . Gastro-esophageal reflux disease with esophagitis 08/26/2015  . Spinal stenosis 08/26/2015  . Vitamin D deficiency 08/26/2015  . Diabetes mellitus, type 2 ([place]) 08/22/2015  . DDD (degenerative disc disease), cervical 07/10/2015  . Cervical radicular pain 04/30/2013  . Knee osteoarthritis 01/05/2012    Past Surgical History:  Procedure Laterality Date  . ABDOMINAL HYSTERECTOMY    . ANTERIOR CERVICAL DECOMP/DISCECTOMY FUSION N/A 07/10/2015   Procedure: Cervical five-six, Cervical six-seven anterior cervical decompression with fusion plating and bonegraft;  Surgeon: Jovita Gamma, MD;  Location: [facility];  Service: Neurosurgery;  Laterality: N/A;  . APPENDECTOMY    . BIOPSY EYE MUSCLE  03/20/2016  biopsy vessel to right eye due to swelling  . EYE SURGERY Bilateral    cataracts removed, /w "cyrstal lenses"   . SHOULDER ARTHROSCOPY Right    x2   RCR- spurs removed      OB History   None      Home Medications    Prior to Admission medications   Medication Sig Start Date End Date Taking? Authorizing Provider  albuterol (PROVENTIL HFA;VENTOLIN HFA) 108 (90 Base) MCG/ACT inhaler Inhale 1-2 puffs into the lungs every 6 (six) hours as needed for wheezing or shortness of breath. 01/01/16  Yes  Eustaquio Maize, MD  carvedilol (COREG) 3.125 MG tablet Take 1 tablet (3.125 mg total) by mouth 2 (two) times daily. 11/10/17 02/23/18 Yes Herminio Commons, MD  clopidogrel (PLAVIX) 75 MG tablet Take 1 tablet (75 mg total) by mouth daily. 11/03/17  Yes Eustaquio Maize, MD  DULoxetine (CYMBALTA) 30 MG capsule Take 2 capsules (60 mg total) by mouth daily. 11/03/17  Yes Eustaquio Maize, MD  famotidine (PEPCID) 20 MG tablet TAKE 1 TABLET (20 MG TOTAL) 2 (TWO) TIMES DAILY AS NEEDED BY MOUTH FOR HEARTBURN OR INDIGESTION. 11/29/17  Yes Eustaquio Maize, MD  fluticasone (FLONASE) 50 MCG/ACT nasal spray Place 2 sprays into both nostrils daily. 01/28/18  Yes Hawks, Christy A, FNP  fluticasone furoate-vilanterol (BREO ELLIPTA) 200-25 MCG/INH AEPB Inhale 1 puff into the lungs daily. 02/23/18  Yes Eustaquio Maize, MD  gabapentin (NEURONTIN) 300 MG capsule TAKE 1 CAPSULE BY MOUTH THREE TIMES A DAY 07/08/17  Yes Patel, Domenick Bookbinder, MD  glimepiride (AMARYL) 2 MG tablet TAKE 1 TABLET (2 MG TOTAL) BY MOUTH DAILY BEFORE BREAKFAST. 12/14/17  Yes Eustaquio Maize, MD  latanoprost (XALATAN) 0.005 % ophthalmic solution Place 1 drop into both eyes at bedtime. 03/31/16  Yes Love, Ivan Anchors, PA-C  linaclotide (LINZESS) 290 MCG CAPS capsule Take 1 capsule (290 mcg total) by mouth daily before breakfast. 09/28/17  Yes Eustaquio Maize, MD  lisinopril (PRINIVIL,ZESTRIL) 20 MG tablet TAKE 1 TABLET BY MOUTH EVERY DAY Patient taking differently: Take 20 mg by mouth 2 (two) times daily.  12/13/17  Yes Eustaquio Maize, MD  montelukast (SINGULAIR) 10 MG tablet TAKE 1 TABLET BY MOUTH EVERYDAY AT BEDTIME 11/03/17  Yes Eustaquio Maize, MD  nitroGLYCERIN (NITROSTAT) 0.4 MG SL tablet Place 1 tablet (0.4 mg total) under the tongue every 5 (five) minutes as needed. 01/12/18  Yes Herminio Commons, MD  OXYGEN Inhale 3-4 L into the lungs at bedtime.   Yes [provider]  rosuvastatin (CRESTOR) 40 MG tablet Take 1 tablet (40 mg total)  by mouth every evening. 05/04/17  Yes Eustaquio Maize, MD  tolterodine (DETROL) 1 MG tablet Take 1 tablet (1 mg total) by mouth 2 (two) times daily. 01/27/18  Yes Stacks, Cletus Gash, MD  traMADol (ULTRAM) 50 MG tablet TAKE 1 TABLET EVERY 12 HOURS AS NEEDED FOR MODERATE OR SEVERE PAIN Patient taking differently: Take 50 mg by mouth 2 (two) times daily.  11/03/17  Yes Eustaquio Maize, MD  traZODone (DESYREL) 50 MG tablet TAKE 1 TABLET (50 MG TOTAL) BY MOUTH NIGHTLY AS NEEDED FOR FOR SLEEP. 11/03/17  Yes Eustaquio Maize, MD  Triamcinolone Acetonide (TRIAMCINOLONE 0.1 % CREAM : EUCERIN) CREA Apply 1 application topically 2 (two) times daily. 07/18/17  Yes Eustaquio Maize, MD  acetaminophen (TYLENOL) 325 MG tablet Take 2 tablets (650 mg total) by mouth every 6 (six) hours as  needed. 02/23/18   Varney Biles, MD  benzonatate (TESSALON) 200 MG capsule Take 1 capsule (200 mg total) by mouth 3 (three) times daily as needed. Patient not taking: Reported on 02/23/2018 01/28/18   Evelina Dun A, FNP  glucose blood test strip Use as instructed 07/04/17   Eustaquio Maize, MD  HYDROcodone-acetaminophen (NORCO/VICODIN) 5-325 MG tablet Take 1 tablet by mouth every 12 (twelve) hours as needed for up to 3 days for severe pain. 02/23/18 02/26/18  Varney Biles, MD  Lancets [facility] ULTRASOFT) lancets Use as instructed 07/18/17   Eustaquio Maize, MD  Lidocaine 4 % PTCH Apply 1 patch topically 2 (two) times daily. 02/23/18   Varney Biles, MD  [facility] DELICA LANCETS FINE MISC TEST TWICE DAILY 12/26/17   Eustaquio Maize, MD    Family History Family History  Problem Relation Age of Onset  . Heart disease Mother   . Cancer Father   . Heart disease Father   . Arthritis Sister   . Heart failure Sister   . Cancer Brother   . Thyroid cancer Son   . Atrial fibrillation Son   . Arthritis Sister     Social History Social History   Tobacco Use  . Smoking status: Never Smoker  . Smokeless tobacco: Never Used    Substance Use Topics  . Alcohol use: No  . Drug use: No     Allergies   Azithromycin; Prednisone; Sulfamethoxazole-trimethoprim; Amlodipine; Ciprofloxacin; and Tape   Review of Systems Review of Systems  Constitutional: Positive for activity change.  Respiratory: Negative for chest tightness.   Cardiovascular: Negative for chest pain.  Gastrointestinal: Negative for abdominal pain.  Musculoskeletal: Positive for arthralgias and back pain.  Neurological: Negative for light-headedness, numbness and headaches.  Hematological: Does not bruise/bleed easily.  All other systems reviewed and are negative.    Physical Exam Updated Vital Signs BP 132/70   Pulse 66   Temp 97.6 F (36.4 C) (Oral)   Resp 19   Ht 5\' 1"  (1.549 m)   Wt 91 kg   SpO2 94%   BMI 37.91 kg/m   Physical Exam  Constitutional: She is oriented to person, place, and time. She appears well-developed and well-nourished.  HENT:  Head: Normocephalic and atraumatic.  Eyes: Pupils are equal, round, and reactive to light. EOM are normal.  Neck: Neck supple.  Cardiovascular: Normal rate and normal heart sounds.  Pulmonary/Chest: Effort normal.  Abdominal: Soft. She exhibits no distension. There is no tenderness. There is no rebound and no guarding.  Musculoskeletal: She exhibits tenderness. She exhibits no edema.  Patient has tenderness over the lower lumbar region and the left hip posteriorly.  She has some ecchymosis on the lower part of her back bilaterally. No flank tenderness.  Internal and external rotation of the hip is normal.  Patient is able to raise the leg and able to ambulate  Neurological: She is alert and oriented to person, place, and time. No cranial nerve deficit.  Skin: Skin is warm and dry.  Nursing note and vitals reviewed.    ED Treatments / Results  Labs (all labs ordered are listed, but only abnormal results are displayed) Labs Reviewed - No data to  display  EKG None  Radiology Dg Hip Unilat With Pelvis 2-3 Views Left  Result Date: 02/23/2018 CLINICAL DATA:  Fall, left hip pain EXAM: DG HIP (WITH OR WITHOUT PELVIS) 2-3V LEFT COMPARISON:  None. FINDINGS: Mild degenerative changes in the hips bilaterally with joint space  narrowing and spurring. SI joints are symmetric and unremarkable. No acute bony abnormality. Specifically, no fracture, subluxation, or dislocation. IMPRESSION: Mild symmetric degenerative changes in the hips. No acute bony abnormality. Electronically Signed   By: Rolm Baptise M.D.   On: 02/23/2018 19:10    Procedures Procedures (including critical care time)  Medications Ordered in ED Medications  oxyCODONE-acetaminophen (PERCOCET/ROXICET) 5-325 MG per tablet 1 tablet (1 tablet Oral Given 02/23/18 1821)     Initial Impression / Assessment and Plan / ED Course  I have reviewed the triage vital signs and the nursing notes.  Pertinent labs & imaging results that were available during my care of the patient were reviewed by me and considered in my medical decision making (see chart for details).     Patient comes in with chief complaint of fall. She is having worsening hip pain on the left side since her fall. On exam she is able to actively raise the leg and there is no significant tenderness with internal and external rotation of the hip.  X-ray does not show any acute fracture.  Patient has lower spine tenderness as well, however we do not think she has a fracture or any cord compression.  Since patient was able to ambulate, they have agreed to not getting CT scan at this time.  Family does request some home health, which has been ordered.  Final Clinical Impressions(s) / ED Diagnoses   Final diagnoses:  Contusion of left hip, initial encounter  Hematoma    ED Discharge Orders         Ordered    HYDROcodone-acetaminophen (NORCO/VICODIN) 5-325 MG tablet  Every 12 hours PRN     02/23/18 2341     acetaminophen (TYLENOL) 325 MG tablet  Every 6 hours PRN     02/23/18 2342    Lidocaine 4 % PTCH  2 times daily     02/23/18 2342           Varney Biles, MD 02/24/18 0009

## 2018-03-03 ENCOUNTER — Ambulatory Visit (INDEPENDENT_AMBULATORY_CARE_PROVIDER_SITE_OTHER): Payer: Medicare HMO

## 2018-03-03 ENCOUNTER — Encounter: Payer: Self-pay | Admitting: Family Medicine

## 2018-03-03 ENCOUNTER — Ambulatory Visit (INDEPENDENT_AMBULATORY_CARE_PROVIDER_SITE_OTHER): Payer: Medicare HMO | Admitting: Family Medicine

## 2018-03-03 VITALS — BP 136/80 | HR 77 | Temp 97.1°F | Ht 61.0 in | Wt 196.0 lb

## 2018-03-03 DIAGNOSIS — M25552 Pain in left hip: Secondary | ICD-10-CM

## 2018-03-03 DIAGNOSIS — S7002XA Contusion of left hip, initial encounter: Secondary | ICD-10-CM | POA: Diagnosis not present

## 2018-03-03 DIAGNOSIS — M1612 Unilateral primary osteoarthritis, left hip: Secondary | ICD-10-CM | POA: Diagnosis not present

## 2018-03-03 MED ORDER — NABUMETONE 500 MG PO TABS: 500.0000 mg | ORAL_TABLET | Freq: Two times a day (BID) | ORAL | 1 refills | Status: DC

## 2018-03-03 MED ORDER — HYDROCODONE-ACETAMINOPHEN 5-325 MG PO TABS: 1.0000 | ORAL_TABLET | Freq: Four times a day (QID) | ORAL | 0 refills | Status: AC | PRN

## 2018-03-03 NOTE — Progress Notes (Signed)
Subjective:  Patient ID: Susan Patel, female    DOB: [birth year]  Age: 77 y.o. MRN: [id number]  CC: Hip Pain (fall a little over a week ago. Left hip pain since then. Seen at ED on 10/10 and xray was negative for fracture. Does have bruising. Sustained another fall 2 days ago landing on the left hip again. )   HPI Susan Patel presents for continued intractable pain in the left hip. She fell again two days ago. There is now intractable pain. Pt. Points to the posterior aspect of the left hip at the buttocks. Pain is excruciatiing. Husband gives much of the history and feels there must be a fracture in spite of the x-ray due to the extent of her pain. "Someone missed something." She is having trouble walking, sitting and standing due to the pain.   Depression screen [facility] 2/9 02/07/2018 01/28/2018 09/28/2017  Decreased Interest 0 0 0  Down, Depressed, Hopeless 0 0 0  PHQ - 2 Score 0 0 0  Altered sleeping - - -  Tired, decreased energy - - -  Change in appetite - - -  Feeling bad or failure about yourself  - - -  Trouble concentrating - - -  Moving slowly or fidgety/restless - - -  Suicidal thoughts - - -  PHQ-9 Score - - -  Difficult doing work/chores - - -    History Susan Patel has a past medical history of Arthritis, Asthma, Chronic pain, COPD (chronic obstructive pulmonary disease) ([place]), Depression, Diabetes mellitus without complication ([place]), Fibromyalgia, GERD (gastroesophageal reflux disease), Hypertension, Neuromuscular disorder ([place]), and Stroke ([place]).   She has a past surgical history that includes Abdominal hysterectomy; Shoulder arthroscopy (Right); Eye surgery (Bilateral); Appendectomy; Anterior cervical decomp/discectomy fusion (N/A, 07/10/2015); and Biopsy eye muscle (03/20/2016).   Her family history includes Arthritis in her sister and sister; Atrial fibrillation in her son; Cancer in her brother and father; Heart disease in her father and mother; Heart failure in her sister;  Thyroid cancer in her son.She reports that she has never smoked. She has never used smokeless tobacco. She reports that she does not drink alcohol or use drugs.    ROS Review of Systems  Constitutional: Negative.   HENT: Negative.   Eyes: Negative for visual disturbance.  Respiratory: Negative for shortness of breath.   Cardiovascular: Negative for chest pain.  Gastrointestinal: Negative for abdominal pain.  Musculoskeletal: Positive for arthralgias and back pain (at juntion area of buttocks and hip on left).    Objective:  BP 136/80   Pulse 77   Temp (!) 97.1 F (36.2 C) (Oral)   Ht 5\' 1"  (1.549 m)   Wt 196 lb (88.9 kg)   BMI 37.03 kg/m   BP Readings from Last 3 Encounters:  03/03/18 136/80  02/23/18 132/70  02/07/18 (!) 177/96    Wt Readings from Last 3 Encounters:  03/03/18 196 lb (88.9 kg)  02/23/18 200 lb 9.9 oz (91 kg)  02/07/18 202 lb 3.2 oz (91.7 kg)     Physical Exam  Constitutional: She is oriented to person, place, and time. She appears well-developed and well-nourished. She appears distressed (due to intensity of pain.).  Cardiovascular: Normal rate and regular rhythm.  Pulmonary/Chest: Breath sounds normal.  Musculoskeletal: She exhibits tenderness (from SI region to lesser trochanter and around to greater trochanter.).  Neurological: She is alert and oriented to person, place, and time.  Skin: Skin is warm and dry.  Psychiatric: She has a normal  mood and affect. Her behavior is normal. Thought content normal.    Hip XR: preliminary read by Merridy Pascoe - no fracture noted.  Assessment & Plan:   Susan Patel was seen today for hip pain.  Diagnoses and all orders for this visit:  Contusion of left hip, initial encounter -     MR HIP LEFT WO CONTRAST; Future  Pain of left hip joint -     DG HIP UNILAT W OR W/O PELVIS 2-3 VIEWS LEFT; Future -     MR HIP LEFT WO CONTRAST; Future  Other orders -     nabumetone (RELAFEN) 500 MG tablet; Take 1 tablet (500 mg  total) by mouth 2 (two) times daily. For muscle and joint pain -     HYDROcodone-acetaminophen (NORCO) 5-325 MG tablet; Take 1 tablet by mouth every 6 (six) hours as needed for up to 7 days for moderate pain. Take for moderate to severe pain       I am having Susan Patel start on nabumetone and HYDROcodone-acetaminophen. I am also having her maintain her OXYGEN, albuterol, latanoprost, rosuvastatin, glucose blood, gabapentin, triamcinolone 0.1 % cream : eucerin, onetouch ultrasoft, linaclotide, clopidogrel, DULoxetine, traZODone, montelukast, traMADol, carvedilol, famotidine, lisinopril, glimepiride, ONETOUCH DELICA LANCETS FINE, nitroGLYCERIN, tolterodine, benzonatate, fluticasone, fluticasone furoate-vilanterol, acetaminophen, Lidocaine, and Liniments (SALONPAS EX).  Allergies as of 03/03/2018      Reactions   Azithromycin Other (See Comments)   Renal failure   Prednisone Other (See Comments)   Irritability and insomnia   Sulfamethoxazole-trimethoprim Other (See Comments)   Renal failure   Amlodipine    Caused swelling in legs   Ciprofloxacin Itching, Rash   Tape Rash      Medication List        Accurate as of 03/03/18 11:59 PM. Always use your most recent med list.          acetaminophen 325 MG tablet Commonly known as:  TYLENOL Take 2 tablets (650 mg total) by mouth every 6 (six) hours as needed.   albuterol 108 (90 Base) MCG/ACT inhaler Commonly known as:  PROVENTIL HFA;VENTOLIN HFA Inhale 1-2 puffs into the lungs every 6 (six) hours as needed for wheezing or shortness of breath.   benzonatate 200 MG capsule Commonly known as:  TESSALON Take 1 capsule (200 mg total) by mouth 3 (three) times daily as needed.   carvedilol 3.125 MG tablet Commonly known as:  COREG Take 1 tablet (3.125 mg total) by mouth 2 (two) times daily.   clopidogrel 75 MG tablet Commonly known as:  PLAVIX Take 1 tablet (75 mg total) by mouth daily.   DULoxetine 30 MG capsule Commonly  known as:  CYMBALTA Take 2 capsules (60 mg total) by mouth daily.   famotidine 20 MG tablet Commonly known as:  PEPCID TAKE 1 TABLET (20 MG TOTAL) 2 (TWO) TIMES DAILY AS NEEDED BY MOUTH FOR HEARTBURN OR INDIGESTION.   fluticasone 50 MCG/ACT nasal spray Commonly known as:  FLONASE Place 2 sprays into both nostrils daily.   fluticasone furoate-vilanterol 200-25 MCG/INH Aepb Commonly known as:  BREO ELLIPTA Inhale 1 puff into the lungs daily.   gabapentin 300 MG capsule Commonly known as:  NEURONTIN TAKE 1 CAPSULE BY MOUTH THREE TIMES A DAY   glimepiride 2 MG tablet Commonly known as:  AMARYL TAKE 1 TABLET (2 MG TOTAL) BY MOUTH DAILY BEFORE BREAKFAST.   glucose blood test strip Use as instructed   HYDROcodone-acetaminophen 5-325 MG tablet Commonly known as:  NORCO/VICODIN Take 1  tablet by mouth every 6 (six) hours as needed for up to 7 days for moderate pain. Take for moderate to severe pain   latanoprost 0.005 % ophthalmic solution Commonly known as:  XALATAN Place 1 drop into both eyes at bedtime.   Lidocaine 4 % Ptch Apply 1 patch topically 2 (two) times daily.   linaclotide 290 MCG Caps capsule Commonly known as:  LINZESS Take 1 capsule (290 mcg total) by mouth daily before breakfast.   lisinopril 20 MG tablet Commonly known as:  PRINIVIL,ZESTRIL TAKE 1 TABLET BY MOUTH EVERY DAY   montelukast 10 MG tablet Commonly known as:  SINGULAIR TAKE 1 TABLET BY MOUTH EVERYDAY AT BEDTIME   nabumetone 500 MG tablet Commonly known as:  RELAFEN Take 1 tablet (500 mg total) by mouth 2 (two) times daily. For muscle and joint pain   nitroGLYCERIN 0.4 MG SL tablet Commonly known as:  NITROSTAT Place 1 tablet (0.4 mg total) under the tongue every 5 (five) minutes as needed.   onetouch ultrasoft lancets Use as instructed   ONETOUCH DELICA LANCETS FINE Misc TEST TWICE DAILY   OXYGEN Inhale 3-4 L into the lungs at bedtime.   rosuvastatin 40 MG tablet Commonly known as:   CRESTOR Take 1 tablet (40 mg total) by mouth every evening.   SALONPAS EX Apply 1 patch topically 2 (two) times daily.   tolterodine 1 MG tablet Commonly known as:  DETROL Take 1 tablet (1 mg total) by mouth 2 (two) times daily.   traMADol 50 MG tablet Commonly known as:  ULTRAM TAKE 1 TABLET EVERY 12 HOURS AS NEEDED FOR MODERATE OR SEVERE PAIN   traZODone 50 MG tablet Commonly known as:  DESYREL TAKE 1 TABLET (50 MG TOTAL) BY MOUTH NIGHTLY AS NEEDED FOR FOR SLEEP.   triamcinolone 0.1 % cream : eucerin Crea Apply 1 application topically 2 (two) times daily.      Rest at home. Ice to swelling, painful areas.  Follow-up: Return in about 2 weeks (around 03/17/2018), or if symptoms worsen or fail to improve.  Claretta Fraise, M.D.

## 2018-03-05 ENCOUNTER — Encounter: Payer: Self-pay | Admitting: Family Medicine

## 2018-03-06 DIAGNOSIS — R41841 Cognitive communication deficit: Secondary | ICD-10-CM | POA: Diagnosis not present

## 2018-03-06 DIAGNOSIS — I6990 Unspecified sequelae of unspecified cerebrovascular disease: Secondary | ICD-10-CM | POA: Diagnosis not present

## 2018-03-08 DIAGNOSIS — I6990 Unspecified sequelae of unspecified cerebrovascular disease: Secondary | ICD-10-CM | POA: Diagnosis not present

## 2018-03-08 DIAGNOSIS — R41841 Cognitive communication deficit: Secondary | ICD-10-CM | POA: Diagnosis not present

## 2018-03-09 ENCOUNTER — Telehealth: Payer: Self-pay | Admitting: Pediatrics

## 2018-03-09 NOTE — Telephone Encounter (Signed)
This is more of a Juliann Pulse question, if the order needs to be done based off that visit it would have to be done by Dr. Livia Snellen because I think it requires a face-to-face but if he did not feel like he could add the face-to-face for that visit then she would have to come in again for a face-to-face.

## 2018-03-12 ENCOUNTER — Other Ambulatory Visit: Payer: Self-pay | Admitting: Family

## 2018-03-12 DIAGNOSIS — R05 Cough: Secondary | ICD-10-CM

## 2018-03-12 DIAGNOSIS — R059 Cough, unspecified: Secondary | ICD-10-CM

## 2018-03-13 NOTE — Telephone Encounter (Signed)
Last seen 03/03/18  Dr Evette Doffing

## 2018-03-14 ENCOUNTER — Ambulatory Visit (HOSPITAL_COMMUNITY): Payer: Medicare HMO

## 2018-03-16 ENCOUNTER — Ambulatory Visit (INDEPENDENT_AMBULATORY_CARE_PROVIDER_SITE_OTHER): Payer: Medicare HMO | Admitting: Pediatrics

## 2018-03-16 ENCOUNTER — Encounter: Payer: Self-pay | Admitting: Pediatrics

## 2018-03-16 VITALS — BP 144/73 | HR 73 | Temp 97.2°F | Ht 61.0 in | Wt 200.0 lb

## 2018-03-16 DIAGNOSIS — H65113 Acute and subacute allergic otitis media (mucoid) (sanguinous) (serous), bilateral: Secondary | ICD-10-CM

## 2018-03-16 MED ORDER — AMOXICILLIN 875 MG PO TABS: 875.0000 mg | ORAL_TABLET | Freq: Two times a day (BID) | ORAL | 0 refills | Status: DC

## 2018-03-16 NOTE — Progress Notes (Signed)
  Subjective:   Patient ID: Susan Patel, female    DOB: 05-Apr-1941, 77 y.o.   MRN: [id number] CC: Cough  HPI: Susan Patel is a 76 y.o. female   Started having cough 5 to 6 days ago.  Has had some nasal congestion. Ears have been feeling more stopped up.  No fevers.  Appetite is been okay.  Relevant past medical, surgical, family and social history reviewed. Allergies and medications reviewed and updated. Social History   Tobacco Use  Smoking Status Never Smoker  Smokeless Tobacco Never Used   ROS: Per HPI   Objective:    BP (!) 144/73   Pulse 73   Temp (!) 97.2 F (36.2 C) (Oral)   Ht 5\' 1"  (1.549 m)   Wt 200 lb (90.7 kg)   SpO2 100%   BMI 37.79 kg/m   Wt Readings from Last 3 Encounters:  03/16/18 200 lb (90.7 kg)  03/03/18 196 lb (88.9 kg)  02/23/18 200 lb 9.9 oz (91 kg)    Gen: NAD, alert, cooperative with exam, NCAT EYES: EOMI, no conjunctival injection, or no icterus ENT: TMs pink with layering white-yellow effusion bilaterally OP with mild erythema LYMPH: no cervical LAD CV: NRRR, normal S1/S2, no murmur, distal pulses 2+ b/l Resp: CTABL, no wheezes, normal WOB Abd: +BS, soft, NTND. Ext: No edema, warm Neuro: Alert and oriented  Assessment & Plan:  Susan Patel was seen today for cough, ear pressure.  Diagnoses and all orders for this visit:  Acute mucoid otitis media of both ears We will treat with below.  Return precautions and symptomatic care discussed. -     amoxicillin (AMOXIL) 875 MG tablet; Take 1 tablet (875 mg total) by mouth 2 (two) times daily.  Follow up plan: Return in about 2 weeks (around 03/30/2018). Assunta Found, MD [place]

## 2018-03-17 ENCOUNTER — Other Ambulatory Visit: Payer: Self-pay | Admitting: Pediatrics

## 2018-03-23 ENCOUNTER — Other Ambulatory Visit: Payer: Self-pay | Admitting: Pediatrics

## 2018-03-23 DIAGNOSIS — H65113 Acute and subacute allergic otitis media (mucoid) (sanguinous) (serous), bilateral: Secondary | ICD-10-CM

## 2018-03-24 ENCOUNTER — Ambulatory Visit: Payer: Medicare HMO | Admitting: Cardiovascular Disease

## 2018-03-24 DIAGNOSIS — R0989 Other specified symptoms and signs involving the circulatory and respiratory systems: Secondary | ICD-10-CM

## 2018-03-27 ENCOUNTER — Other Ambulatory Visit: Payer: Self-pay | Admitting: Pediatrics

## 2018-03-27 DIAGNOSIS — K219 Gastro-esophageal reflux disease without esophagitis: Secondary | ICD-10-CM

## 2018-03-29 ENCOUNTER — Encounter: Payer: Self-pay | Admitting: Pediatrics

## 2018-03-29 ENCOUNTER — Ambulatory Visit (INDEPENDENT_AMBULATORY_CARE_PROVIDER_SITE_OTHER): Payer: Medicare HMO | Admitting: Pediatrics

## 2018-03-29 VITALS — BP 134/80 | HR 71 | Temp 97.5°F | Resp 18 | Ht 61.0 in | Wt 201.2 lb

## 2018-03-29 DIAGNOSIS — J0100 Acute maxillary sinusitis, unspecified: Secondary | ICD-10-CM

## 2018-03-29 MED ORDER — DOXYCYCLINE HYCLATE 100 MG PO TABS: 100.0000 mg | ORAL_TABLET | Freq: Two times a day (BID) | ORAL | 0 refills | Status: DC

## 2018-03-29 NOTE — Progress Notes (Signed)
  Subjective:   Patient ID: Susan Patel, female    DOB: [birth year], 77 y.o.   MRN: [id number] CC: Cough and Nasal Congestion  HPI: Susan Patel is a 77 y.o. female   3 days ago at 3 AM she fell in the bathroom, tripping over the scale.  She hit left side of her face on the bathtub.  Has some purple bruising around her left eye greater than right eye.  No headaches now.  Still has some tenderness along bruised area.  She did have a light on when she fell.  Usually goes barefoot at home.  Has been wonders if the scale was crooked compared to right usually is which might of contributed to the fall.  She continues to be bothered by nasal congestion, cough, sneezing.  No fevers.  She has fair amount of pressure in her face when she leans forward.  Ongoing nasal discharge.  Appetite has been okay.  Blowing her nose a lot.  Cough is minimally productive.  Relevant past medical, surgical, family and social history reviewed. Allergies and medications reviewed and updated. Social History   Tobacco Use  Smoking Status Never Smoker  Smokeless Tobacco Never Used   ROS: Per HPI   Objective:    BP 134/80   Pulse 71   Temp (!) 97.5 F (36.4 C) (Oral)   Resp 18   Ht 5\' 1"  (1.549 m)   Wt 201 lb 3.2 oz (91.3 kg)   SpO2 96%   BMI 38.02 kg/m   Wt Readings from Last 3 Encounters:  03/29/18 201 lb 3.2 oz (91.3 kg)  03/16/18 200 lb (90.7 kg)  03/03/18 196 lb (88.9 kg)    Gen: NAD, alert, cooperative with exam, NCAT EYES: EOMI, no conjunctival injection, or no icterus ENT:  TMs pearly gray b/l, OP without erythema, mildly tender palpation over sinuses, also some bruising in area though primarily over left upper forehead, some rhonchi. LYMPH: no cervical LAD CV: NRRR, normal S1/S2, no murmur, distal pulses 2+ b/l Resp: CTABL, no wheezes, normal WOB Ext: No edema, warm Neuro: Alert and oriented, strength equal b/l UE and LE, coordination grossly normal  Assessment & Plan:  Susan Patel was seen  today for cough and nasal congestion.  Diagnoses and all orders for this visit:  Acute maxillary sinusitis, recurrence not specified Treat with below.  Symptoms for past 3 weeks.  Treated 2 weeks ago for acute otitis media.  Nasal congestion and pressure bothering her the most.  Return precautions discussed. -     doxycycline (VIBRA-TABS) 100 MG tablet; Take 1 tablet (100 mg total) by mouth 2 (two) times daily.   Follow up plan: Return if symptoms worsen or fail to improve. Assunta Found, MD [place]

## 2018-04-02 ENCOUNTER — Other Ambulatory Visit: Payer: Self-pay | Admitting: Pediatrics

## 2018-04-02 DIAGNOSIS — K5900 Constipation, unspecified: Secondary | ICD-10-CM

## 2018-04-06 ENCOUNTER — Other Ambulatory Visit: Payer: Self-pay | Admitting: Pediatrics

## 2018-04-06 DIAGNOSIS — J0100 Acute maxillary sinusitis, unspecified: Secondary | ICD-10-CM

## 2018-04-09 ENCOUNTER — Other Ambulatory Visit: Payer: Self-pay | Admitting: Family Medicine

## 2018-04-10 ENCOUNTER — Encounter: Payer: Self-pay | Admitting: Pediatrics

## 2018-04-10 ENCOUNTER — Ambulatory Visit (INDEPENDENT_AMBULATORY_CARE_PROVIDER_SITE_OTHER): Payer: Medicare HMO | Admitting: Pediatrics

## 2018-04-10 ENCOUNTER — Ambulatory Visit (INDEPENDENT_AMBULATORY_CARE_PROVIDER_SITE_OTHER): Payer: Medicare HMO

## 2018-04-10 VITALS — BP 139/87 | HR 66 | Temp 97.3°F | Resp 22 | Ht 61.0 in | Wt 202.6 lb

## 2018-04-10 DIAGNOSIS — E119 Type 2 diabetes mellitus without complications: Secondary | ICD-10-CM

## 2018-04-10 DIAGNOSIS — R05 Cough: Secondary | ICD-10-CM

## 2018-04-10 DIAGNOSIS — R059 Cough, unspecified: Secondary | ICD-10-CM

## 2018-04-10 DIAGNOSIS — I10 Essential (primary) hypertension: Secondary | ICD-10-CM

## 2018-04-10 DIAGNOSIS — J3489 Other specified disorders of nose and nasal sinuses: Secondary | ICD-10-CM | POA: Diagnosis not present

## 2018-04-10 LAB — BAYER DCA HB A1C WAIVED: HB A1C: 6 % (ref <7.0)

## 2018-04-10 NOTE — Progress Notes (Signed)
  Subjective:   Patient ID: Susan Patel, female    DOB: [birth year], 77 y.o.   MRN: [id number] CC: Follow-up; Cough; and Sneezing  HPI: Susan Patel is a 77 y.o. female   Today with grandson.  Patient was seen first 10/31, treated with amoxicillin for acute otitis media.  Followed up 2 weeks later, ongoing sinus drainage and pain.  Treated with doxycycline.  No fevers in the last week, appetite is been normal.  No facial pressure or pain.  Bruising is gotten better from recent fall.  Most bothered now by cough and sneezing.  Does okay during the day, anytime she leans back or lies back to rest, starts getting worsening symptoms.  She is using Neti pot in the past, not recently.  Not sure if she is regularly taking Flonase or not.  Relevant past medical, surgical, family and social history reviewed. Allergies and medications reviewed and updated. Social History   Tobacco Use  Smoking Status Never Smoker  Smokeless Tobacco Never Used   ROS: Per HPI   Objective:    BP 139/87   Pulse 66   Temp (!) 97.3 F (36.3 C) (Oral)   Resp (!) 22   Ht '5\' 1"'$  (1.549 m)   Wt 202 lb 10.1 oz (91.9 kg)   SpO2 100%   BMI 38.29 kg/m   Wt Readings from Last 3 Encounters:  04/10/18 202 lb 10.1 oz (91.9 kg)  03/29/18 201 lb 3.2 oz (91.3 kg)  03/16/18 200 lb (90.7 kg)    Gen: NAD, alert, cooperative with exam, NCAT EYES: EOMI, no conjunctival injection, or no icterus ENT:  TMs dull gray b/l with small layering effusion, OP without erythema.  No tenderness to palpation over sinuses. LYMPH: no cervical LAD CV: NRRR, normal S1/S2, no murmur, distal pulses 2+ b/l Resp: CTABL, no wheezes, normal WOB Abd: +BS, soft, NTND Ext: No edema, warm Neuro: Alert, answers questions appropriately.  Dysarthric MSK: normal muscle bulk Skin: Slight yellow bruising below eyes bilaterally, over left eye.  Assessment & Plan:  [name] was seen today for follow-up, cough and sneezing.  Diagnoses and all orders for  this visit:  Sinus drainage Has been on antibiotics for the last 3.5 weeks.  No fevers now, appetite normal.  Suspect lingering symptoms related to sinus drainage.  Discussed use of Nettie pot with normal saline solution with normal saline nose spray an hour or so before leaning back or bed to help improve sinus drainage problems.  Continue using Flonase daily.  Essential hypertension Stable, continue current medicines -     BMP8+EGFR  Diabetes mellitus without complication (HCC) -     Bayer DCA Hb A1c Waived  Cough -     DG Chest 2 View; Future   Follow up plan: Return in about 4 weeks (around 05/08/2018) for Chronic follow-up. Assunta Found, MD [place]

## 2018-04-10 NOTE — Patient Instructions (Signed)
Fever reducer and headache: tylenol   Sinus pressure:  Nasal steroid such as flonase/fluticaone or nasocort daily Can also take daily antihistamine such as loratadine/claritin or cetirizine/zyrtec  Sinus rinses/irritation: Netipot or similar with distilled water 2-3 times a day to clear out sinuses or Normal saline nasal spray  Sore throat:  Throat lozenges chloroseptic spray  Stick with bland foods Drink lots of fluids  

## 2018-04-11 LAB — BMP8+EGFR
BUN/Creatinine Ratio: 19 (ref 12–28)
BUN: 18 mg/dL (ref 8–27)
CO2: 23 mmol/L (ref 20–29)
CREATININE: 0.95 mg/dL (ref 0.57–1.00)
Calcium: 9.5 mg/dL (ref 8.7–10.3)
Chloride: 100 mmol/L (ref 96–106)
GFR, EST AFRICAN AMERICAN: 68 mL/min/{1.73_m2} (ref >59)
GFR, EST NON AFRICAN AMERICAN: 59 mL/min/{1.73_m2} — AB (ref >59)
Glucose: 61 mg/dL — ABNORMAL LOW (ref 65–99)
POTASSIUM: 4.9 mmol/L (ref 3.5–5.2)
Sodium: 140 mmol/L (ref 134–144)

## 2018-04-12 ENCOUNTER — Other Ambulatory Visit: Payer: Self-pay | Admitting: Pediatrics

## 2018-05-02 ENCOUNTER — Other Ambulatory Visit: Payer: Self-pay | Admitting: Pediatrics

## 2018-05-02 DIAGNOSIS — G4700 Insomnia, unspecified: Secondary | ICD-10-CM

## 2018-05-02 NOTE — Telephone Encounter (Signed)
Last seen 04/10/18 

## 2018-05-08 ENCOUNTER — Other Ambulatory Visit: Payer: Self-pay | Admitting: Pediatrics

## 2018-05-10 ENCOUNTER — Other Ambulatory Visit: Payer: Self-pay | Admitting: Pediatrics

## 2018-05-10 DIAGNOSIS — M797 Fibromyalgia: Secondary | ICD-10-CM

## 2018-05-11 ENCOUNTER — Other Ambulatory Visit: Payer: Self-pay | Admitting: Pediatrics

## 2018-05-11 DIAGNOSIS — G8929 Other chronic pain: Secondary | ICD-10-CM

## 2018-05-18 ENCOUNTER — Other Ambulatory Visit: Payer: Self-pay | Admitting: Cardiovascular Disease

## 2018-05-18 ENCOUNTER — Ambulatory Visit (INDEPENDENT_AMBULATORY_CARE_PROVIDER_SITE_OTHER): Payer: Medicare HMO | Admitting: Cardiovascular Disease

## 2018-05-18 ENCOUNTER — Encounter: Payer: Self-pay | Admitting: Cardiovascular Disease

## 2018-05-18 ENCOUNTER — Telehealth: Payer: Self-pay | Admitting: Cardiovascular Disease

## 2018-05-18 ENCOUNTER — Encounter: Payer: Self-pay | Admitting: *Deleted

## 2018-05-18 VITALS — BP 149/90 | HR 72 | Ht 61.0 in | Wt 197.2 lb

## 2018-05-18 DIAGNOSIS — I10 Essential (primary) hypertension: Secondary | ICD-10-CM | POA: Diagnosis not present

## 2018-05-18 DIAGNOSIS — R931 Abnormal findings on diagnostic imaging of heart and coronary circulation: Secondary | ICD-10-CM | POA: Diagnosis not present

## 2018-05-18 DIAGNOSIS — Z8673 Personal history of transient ischemic attack (TIA), and cerebral infarction without residual deficits: Secondary | ICD-10-CM | POA: Diagnosis not present

## 2018-05-18 DIAGNOSIS — E785 Hyperlipidemia, unspecified: Secondary | ICD-10-CM | POA: Diagnosis not present

## 2018-05-18 DIAGNOSIS — R05 Cough: Secondary | ICD-10-CM | POA: Diagnosis not present

## 2018-05-18 DIAGNOSIS — Z01812 Encounter for preprocedural laboratory examination: Secondary | ICD-10-CM

## 2018-05-18 DIAGNOSIS — I209 Angina pectoris, unspecified: Secondary | ICD-10-CM

## 2018-05-18 DIAGNOSIS — J209 Acute bronchitis, unspecified: Secondary | ICD-10-CM | POA: Diagnosis not present

## 2018-05-18 DIAGNOSIS — I25118 Atherosclerotic heart disease of native coronary artery with other forms of angina pectoris: Secondary | ICD-10-CM

## 2018-05-18 NOTE — Telephone Encounter (Signed)
Pre-cert Verification for the following procedure   Left heart cath [facility] - 1/7 - Tuesday - 7:30

## 2018-05-18 NOTE — Patient Instructions (Addendum)
Medication Instructions:  Continue all current medications.  Labwork:  BMET, CBC, FLP - orders given today.   Office will contact with results via phone or letter.    Testing/Procedures: Your physician has requested that you have a cardiac catheterization. Cardiac catheterization is used to diagnose and/or treat various heart conditions. Doctors may recommend this procedure for a number of different reasons. The most common reason is to evaluate chest pain. Chest pain can be a symptom of coronary artery disease (CAD), and cardiac catheterization can show whether plaque is narrowing or blocking your heart's arteries. This procedure is also used to evaluate the valves, as well as measure the blood flow and oxygen levels in different parts of your heart. For further information please visit [email or web address]. Please follow instruction sheet, as given.  Follow-Up: 1 month   Any Other Special Instructions Will Be Listed Below (If Applicable).  If you need a refill on your cardiac medications before your next appointment, please call your pharmacy.

## 2018-05-18 NOTE — Progress Notes (Signed)
SUBJECTIVE: The patient returns for follow-up after undergoing cardiovascular testing performed for the evaluation of chest pain.  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  Cardiac catheterization was recommended.  She is here with her husband.  She has had to use nitroglycerin on at least 3 separate occasions for chest pain and had to use 2 tablets on one occasion.  She continues to experience chest pain and shortness of breath which intermittently occurs with exertion.  Her blood pressure is 149/90 today.  On 04/10/2018, it was 139/87.  It was 134/80 on 03/29/2018 and 144/73 on 03/16/2018.      Review of Systems: As per "subjective", otherwise negative.  Allergies  Allergen Reactions  . Azithromycin Other (See Comments)    Renal failure  . Prednisone Other (See Comments)    Irritability and insomnia  . Sulfamethoxazole-Trimethoprim Other (See Comments)    Renal failure  . Amlodipine     Caused swelling in legs  . Ciprofloxacin Itching and Rash  . Tape Rash    Current Outpatient Medications  Medication Sig Dispense Refill  . acetaminophen (TYLENOL) 325 MG tablet Take 2 tablets (650 mg total) by mouth every 6 (six) hours as needed. 30 tablet 0  . albuterol (PROVENTIL HFA;VENTOLIN HFA) 108 (90 Base) MCG/ACT inhaler Inhale 1-2 puffs into the lungs every 6 (six) hours as needed for wheezing or shortness of breath. 1 Inhaler 1  . carvedilol (COREG) 3.125 MG tablet Take 1 tablet (3.125 mg total) by mouth 2 (two) times daily. 180 tablet 3  . clopidogrel (PLAVIX) 75 MG tablet Take 1 tablet (75 mg total) by mouth daily. 90 tablet 1  . DULoxetine (CYMBALTA) 30 MG capsule TAKE 2 CAPSULES (60 MG TOTAL) BY MOUTH DAILY. 180 capsule 0  . famotidine (PEPCID) 20 MG tablet TAKE 1 TABLET (20 MG TOTAL) 2 (TWO) TIMES DAILY AS NEEDED BY MOUTH FOR HEARTBURN OR INDIGESTION. 180 tablet 0  . fluticasone (FLONASE) 50 MCG/ACT nasal spray Place 2  sprays into both nostrils daily. 48 g 1  . fluticasone furoate-vilanterol (BREO ELLIPTA) 200-25 MCG/INH AEPB Inhale 1 puff into the lungs daily. 30 each 2  . gabapentin (NEURONTIN) 300 MG capsule TAKE 1 CAPSULE BY MOUTH THREE TIMES A DAY 270 capsule 1  . glucose blood test strip Use as instructed 100 each 2  . Lancets (ONETOUCH ULTRASOFT) lancets Use as instructed 100 each 12  . latanoprost (XALATAN) 0.005 % ophthalmic solution Place 1 drop into both eyes at bedtime. 2.5 mL 12  . Lidocaine 4 % PTCH Apply 1 patch topically 2 (two) times daily. 12 patch 0  . Liniments (SALONPAS EX) Apply 1 patch topically 2 (two) times daily.  0  . LINZESS 290 MCG CAPS capsule TAKE 1 CAPSULE BY MOUTH DAILY BEFORE BREAKFAST. 90 capsule 0  . lisinopril (PRINIVIL,ZESTRIL) 20 MG tablet TAKE 2 TABLETS BY MOUTH EVERY DAY 180 tablet 1  . montelukast (SINGULAIR) 10 MG tablet TAKE 1 TABLET BY MOUTH EVERYDAY AT BEDTIME 90 tablet 1  . nabumetone (RELAFEN) 500 MG tablet Take 1 tablet (500 mg total) by mouth 2 (two) times daily. For muscle and joint pain 360 tablet 1  . nitroGLYCERIN (NITROSTAT) 0.4 MG SL tablet Place 1 tablet (0.4 mg total) under the tongue every 5 (five) minutes as needed. 25 tablet 3  . ONETOUCH DELICA LANCETS FINE MISC TEST TWICE DAILY 100 each 9  . OXYGEN Inhale 3-4 L into the lungs at  bedtime.    . rosuvastatin (CRESTOR) 40 MG tablet Take 1 tablet (40 mg total) by mouth every evening. 90 tablet 1  . tolterodine (DETROL) 1 MG tablet TAKE 1 TABLET BY MOUTH TWICE A DAY 180 tablet 1  . traMADol (ULTRAM) 50 MG tablet TAKE 1 TABLET EVERY 12 HOURS AS NEEDED FOR MODERATE OR SEVERE PAIN (Patient taking differently: Take 50 mg by mouth 2 (two) times daily. ) 60 tablet 2  . traZODone (DESYREL) 50 MG tablet TAKE 1 TABLET (50 MG TOTAL) BY MOUTH NIGHTLY AS NEEDED FOR FOR SLEEP. 90 tablet 1  . Triamcinolone Acetonide (TRIAMCINOLONE 0.1 % CREAM : EUCERIN) CREA Apply 1 application topically 2 (two) times daily. 1 each 1    . triamcinolone cream (KENALOG) 0.1 % APPLY TOPICALLY TWO TIMES DAILY 80 g 0   No current facility-administered medications for this visit.     Past Medical History:  Diagnosis Date  . Arthritis    'all over"  . Asthma   . Chronic pain   . COPD (chronic obstructive pulmonary disease) (HCC)    O2 per [place] at nights   . Depression   . Diabetes mellitus without complication ([place])    borderline- , states she was on med., but MD told her "everything is under control so I threw the bottle away"  . Fibromyalgia   . GERD (gastroesophageal reflux disease)   . Hypertension   . Neuromuscular disorder (HCC)    parkinson, neuropathy- both feet & hands.  . 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 ABDOMINAL HYSTERECTOMY    . ANTERIOR CERVICAL DECOMP/DISCECTOMY FUSION N/A 07/10/2015   Procedure: Cervical five-six, Cervical six-seven anterior cervical decompression with fusion plating and bonegraft;  Surgeon: Jovita Gamma, MD;  Location: [place] [facility];  Service: Neurosurgery;  Laterality: N/A;  . APPENDECTOMY    . BIOPSY EYE MUSCLE  03/20/2016   biopsy vessel to right eye due to swelling  . EYE SURGERY Bilateral    cataracts removed, /w "cyrstal lenses"   . SHOULDER ARTHROSCOPY Right    x2   RCR- spurs removed     Social History   Socioeconomic History  . Marital status: Married    Spouse name: wayne  . Number of children: 2  . Years of education: 16  . Highest education level: Bachelor's degree (e.g., BA, AB, BS)  Occupational History  . Occupation: retired    Comment: Automotive engineer  Social Needs  . Financial resource strain: Not hard at all  . Food insecurity:    Worry: Never true    Inability: Never true  . Transportation needs:    Medical: No    Non-medical: No  Tobacco Use  . Smoking status: Never Smoker  . Smokeless tobacco: Never Used  Substance and Sexual Activity  . Alcohol use: No  . Drug use: No   . Sexual activity: Yes  Lifestyle  . Physical activity:    Days per week: 0 days    Minutes per session: 0 min  . Stress: Not at all  Relationships  . Social connections:    Talks on phone: More than three times a week    Gets together: More than three times a week    Attends religious service: More than 4 times per year    Active member of club or organization: Yes    Attends meetings of clubs or organizations: More than 4 times per  year    Relationship status: Married  . Intimate partner violence:    Fear of current or ex partner: Not on file    Emotionally abused: Not on file    Physically abused: Not on file    Forced sexual activity: Not on file  Other Topics Concern  . Not on file  Social History Narrative  . Not on file     Vitals:   05/18/18 0949  BP: (!) 149/90  Pulse: 72  SpO2: 97%  Weight: 197 lb 3.2 oz (89.4 kg)  Height: 5\' 1"  (1.549 m)    Wt Readings from Last 3 Encounters:  05/18/18 197 lb 3.2 oz (89.4 kg)  04/10/18 202 lb 10.1 oz (91.9 kg)  03/29/18 201 lb 3.2 oz (91.3 kg)     PHYSICAL EXAM General: NAD HEENT: Normal. Neck: No JVD, no thyromegaly. Lungs: Clear to auscultation bilaterally with normal respiratory effort. CV: Regular rate and rhythm, normal S1/S2, no S3/S4, no murmur. No pretibial or periankle edema.   Abdomen: Soft, nontender, no distention.  Neurologic: Alert and oriented.  Dysarthric. Psych: Normal affect. Skin: Normal. Musculoskeletal: No gross deformities.    ECG: Reviewed above under Subjective   Labs: Lab Results  Component Value Date/Time   K 4.9 04/10/2018 02:54 PM   BUN 18 04/10/2018 02:54 PM   CREATININE 0.95 04/10/2018 02:54 PM   ALT 10 03/23/2017 03:53 PM   TSH 3.699 03/22/2016 06:31 AM   HGB 11.7 (L) 10/18/2016 07:20 AM   HGB 12.4 11/06/2015 12:57 PM     Lipids: Lab Results  Component Value Date/Time   LDLCALC 163 (H) 05/03/2017 11:15 AM   CHOL 255 (H) 05/03/2017 11:15 AM   TRIG 165 (H) 05/03/2017  11:15 AM   HDL 59 05/03/2017 11:15 AM       ASSESSMENT AND PLAN:  1.  Chest pain and shortness of breath: She has multiple cardiovascular risk factorse.  ECG is markedly abnormal suggestive of inferior and anterolateral ischemia.  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  Currently on Coreg, Plavix, and Crestor. I will arrange for coronary angiography.  We had a long discussion regarding this. Risks and benefits of cardiac catheterization have been discussed with the patient.  These include bleeding, infection, kidney damage, stroke, heart attack, death.  The patient understands these risks and is willing to proceed.  2.  Hypertension: Blood pressure is elevated today.  She has a history of stroke.  She is on lisinopril 40 mg daily.  I will monitor.  3.  Hyperlipidemia: Currently on Crestor 40 mg.    I will check lipids. I reviewed the lipid panel from 05/03/17 which showed LDL 163, triglycerides 165, and total cholesterol 255.  4.  History of stroke: Currently on Plavix, Crestor, carvedilol, and lisinopril.     Disposition: Follow up after cath  A high level of decision making was required for increased medical complexities.    Kate Sable, M.D., F.A.C.C.

## 2018-05-18 NOTE — H&P (View-Only) (Signed)
SUBJECTIVE: The patient returns for follow-up after undergoing cardiovascular testing performed for the evaluation of chest pain.  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  Cardiac catheterization was recommended.  She is here with her husband.  She has had to use nitroglycerin on at least 3 separate occasions for chest pain and had to use 2 tablets on one occasion.  She continues to experience chest pain and shortness of breath which intermittently occurs with exertion.  Her blood pressure is 149/90 today.  On 04/10/2018, it was 139/87.  It was 134/80 on 03/29/2018 and 144/73 on 03/16/2018.      Review of Systems: As per "subjective", otherwise negative.  Allergies  Allergen Reactions  . Azithromycin Other (See Comments)    Renal failure  . Prednisone Other (See Comments)    Irritability and insomnia  . Sulfamethoxazole-Trimethoprim Other (See Comments)    Renal failure  . Amlodipine     Caused swelling in legs  . Ciprofloxacin Itching and Rash  . Tape Rash    Current Outpatient Medications  Medication Sig Dispense Refill  . acetaminophen (TYLENOL) 325 MG tablet Take 2 tablets (650 mg total) by mouth every 6 (six) hours as needed. 30 tablet 0  . albuterol (PROVENTIL HFA;VENTOLIN HFA) 108 (90 Base) MCG/ACT inhaler Inhale 1-2 puffs into the lungs every 6 (six) hours as needed for wheezing or shortness of breath. 1 Inhaler 1  . carvedilol (COREG) 3.125 MG tablet Take 1 tablet (3.125 mg total) by mouth 2 (two) times daily. 180 tablet 3  . clopidogrel (PLAVIX) 75 MG tablet Take 1 tablet (75 mg total) by mouth daily. 90 tablet 1  . DULoxetine (CYMBALTA) 30 MG capsule TAKE 2 CAPSULES (60 MG TOTAL) BY MOUTH DAILY. 180 capsule 0  . famotidine (PEPCID) 20 MG tablet TAKE 1 TABLET (20 MG TOTAL) 2 (TWO) TIMES DAILY AS NEEDED BY MOUTH FOR HEARTBURN OR INDIGESTION. 180 tablet 0  . fluticasone (FLONASE) 50 MCG/ACT nasal spray Place 2  sprays into both nostrils daily. 48 g 1  . fluticasone furoate-vilanterol (BREO ELLIPTA) 200-25 MCG/INH AEPB Inhale 1 puff into the lungs daily. 30 each 2  . gabapentin (NEURONTIN) 300 MG capsule TAKE 1 CAPSULE BY MOUTH THREE TIMES A DAY 270 capsule 1  . glucose blood test strip Use as instructed 100 each 2  . Lancets (ONETOUCH ULTRASOFT) lancets Use as instructed 100 each 12  . latanoprost (XALATAN) 0.005 % ophthalmic solution Place 1 drop into both eyes at bedtime. 2.5 mL 12  . Lidocaine 4 % PTCH Apply 1 patch topically 2 (two) times daily. 12 patch 0  . Liniments (SALONPAS EX) Apply 1 patch topically 2 (two) times daily.  0  . LINZESS 290 MCG CAPS capsule TAKE 1 CAPSULE BY MOUTH DAILY BEFORE BREAKFAST. 90 capsule 0  . lisinopril (PRINIVIL,ZESTRIL) 20 MG tablet TAKE 2 TABLETS BY MOUTH EVERY DAY 180 tablet 1  . montelukast (SINGULAIR) 10 MG tablet TAKE 1 TABLET BY MOUTH EVERYDAY AT BEDTIME 90 tablet 1  . nabumetone (RELAFEN) 500 MG tablet Take 1 tablet (500 mg total) by mouth 2 (two) times daily. For muscle and joint pain 360 tablet 1  . nitroGLYCERIN (NITROSTAT) 0.4 MG SL tablet Place 1 tablet (0.4 mg total) under the tongue every 5 (five) minutes as needed. 25 tablet 3  . ONETOUCH DELICA LANCETS FINE MISC TEST TWICE DAILY 100 each 9  . OXYGEN Inhale 3-4 L into the lungs at  bedtime.    . rosuvastatin (CRESTOR) 40 MG tablet Take 1 tablet (40 mg total) by mouth every evening. 90 tablet 1  . tolterodine (DETROL) 1 MG tablet TAKE 1 TABLET BY MOUTH TWICE A DAY 180 tablet 1  . traMADol (ULTRAM) 50 MG tablet TAKE 1 TABLET EVERY 12 HOURS AS NEEDED FOR MODERATE OR SEVERE PAIN (Patient taking differently: Take 50 mg by mouth 2 (two) times daily. ) 60 tablet 2  . traZODone (DESYREL) 50 MG tablet TAKE 1 TABLET (50 MG TOTAL) BY MOUTH NIGHTLY AS NEEDED FOR FOR SLEEP. 90 tablet 1  . Triamcinolone Acetonide (TRIAMCINOLONE 0.1 % CREAM : EUCERIN) CREA Apply 1 application topically 2 (two) times daily. 1 each 1    . triamcinolone cream (KENALOG) 0.1 % APPLY TOPICALLY TWO TIMES DAILY 80 g 0   No current facility-administered medications for this visit.     Past Medical History:  Diagnosis Date  . Arthritis    'all over"  . Asthma   . Chronic pain   . COPD (chronic obstructive pulmonary disease) (HCC)    O2 per Section at nights   . Depression   . Diabetes mellitus without complication ([place])    borderline- , states she was on med., but MD told her "everything is under control so I threw the bottle away"  . Fibromyalgia   . GERD (gastroesophageal reflux disease)   . Hypertension   . Neuromuscular disorder (HCC)    parkinson, neuropathy- both feet & hands.  . 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 ABDOMINAL HYSTERECTOMY    . ANTERIOR CERVICAL DECOMP/DISCECTOMY FUSION N/A 07/10/2015   Procedure: Cervical five-six, Cervical six-seven anterior cervical decompression with fusion plating and bonegraft;  Surgeon: Jovita Gamma, MD;  Location: [facility];  Service: Neurosurgery;  Laterality: N/A;  . APPENDECTOMY    . BIOPSY EYE MUSCLE  03/20/2016   biopsy vessel to right eye due to swelling  . EYE SURGERY Bilateral    cataracts removed, /w "cyrstal lenses"   . SHOULDER ARTHROSCOPY Right    x2   RCR- spurs removed     Social History   Socioeconomic History  . Marital status: Married    Spouse name: wayne  . Number of children: 2  . Years of education: 16  . Highest education level: Bachelor's degree (e.g., BA, AB, BS)  Occupational History  . Occupation: retired    Comment: Automotive engineer  Social Needs  . Financial resource strain: Not hard at all  . Food insecurity:    Worry: Never true    Inability: Never true  . Transportation needs:    Medical: No    Non-medical: No  Tobacco Use  . Smoking status: Never Smoker  . Smokeless tobacco: Never Used  Substance and Sexual Activity  . Alcohol use: No  . Drug use: No   . Sexual activity: Yes  Lifestyle  . Physical activity:    Days per week: 0 days    Minutes per session: 0 min  . Stress: Not at all  Relationships  . Social connections:    Talks on phone: More than three times a week    Gets together: More than three times a week    Attends religious service: More than 4 times per year    Active member of club or organization: Yes    Attends meetings of clubs or organizations: More than 4 times per  year    Relationship status: Married  . Intimate partner violence:    Fear of current or ex partner: Not on file    Emotionally abused: Not on file    Physically abused: Not on file    Forced sexual activity: Not on file  Other Topics Concern  . Not on file  Social History Narrative  . Not on file     Vitals:   05/18/18 0949  BP: (!) 149/90  Pulse: 72  SpO2: 97%  Weight: 197 lb 3.2 oz (89.4 kg)  Height: 5\' 1"  (1.549 m)    Wt Readings from Last 3 Encounters:  05/18/18 197 lb 3.2 oz (89.4 kg)  04/10/18 202 lb 10.1 oz (91.9 kg)  03/29/18 201 lb 3.2 oz (91.3 kg)     PHYSICAL EXAM General: NAD HEENT: Normal. Neck: No JVD, no thyromegaly. Lungs: Clear to auscultation bilaterally with normal respiratory effort. CV: Regular rate and rhythm, normal S1/S2, no S3/S4, no murmur. No pretibial or periankle edema.   Abdomen: Soft, nontender, no distention.  Neurologic: Alert and oriented.  Dysarthric. Psych: Normal affect. Skin: Normal. Musculoskeletal: No gross deformities.    ECG: Reviewed above under Subjective   Labs: Lab Results  Component Value Date/Time   K 4.9 04/10/2018 02:54 PM   BUN 18 04/10/2018 02:54 PM   CREATININE 0.95 04/10/2018 02:54 PM   ALT 10 03/23/2017 03:53 PM   TSH 3.699 03/22/2016 06:31 AM   HGB 11.7 (L) 10/18/2016 07:20 AM   HGB 12.4 11/06/2015 12:57 PM     Lipids: Lab Results  Component Value Date/Time   LDLCALC 163 (H) 05/03/2017 11:15 AM   CHOL 255 (H) 05/03/2017 11:15 AM   TRIG 165 (H) 05/03/2017  11:15 AM   HDL 59 05/03/2017 11:15 AM       ASSESSMENT AND PLAN:  1.  Chest pain and shortness of breath: She has multiple cardiovascular risk factorse.  ECG is markedly abnormal suggestive of inferior and anterolateral ischemia.  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  Currently on Coreg, Plavix, and Crestor. I will arrange for coronary angiography.  We had a long discussion regarding this. Risks and benefits of cardiac catheterization have been discussed with the patient.  These include bleeding, infection, kidney damage, stroke, heart attack, death.  The patient understands these risks and is willing to proceed.  2.  Hypertension: Blood pressure is elevated today.  She has a history of stroke.  She is on lisinopril 40 mg daily.  I will monitor.  3.  Hyperlipidemia: Currently on Crestor 40 mg.    I will check lipids. I reviewed the lipid panel from 05/03/17 which showed LDL 163, triglycerides 165, and total cholesterol 255.  4.  History of stroke: Currently on Plavix, Crestor, carvedilol, and lisinopril.     Disposition: Follow up after cath  A high level of decision making was required for increased medical complexities.    Kate Sable, M.D., F.A.C.C.

## 2018-05-19 ENCOUNTER — Telehealth: Payer: Self-pay

## 2018-05-19 DIAGNOSIS — E785 Hyperlipidemia, unspecified: Secondary | ICD-10-CM | POA: Diagnosis not present

## 2018-05-19 DIAGNOSIS — I209 Angina pectoris, unspecified: Secondary | ICD-10-CM | POA: Diagnosis not present

## 2018-05-19 DIAGNOSIS — Z01812 Encounter for preprocedural laboratory examination: Secondary | ICD-10-CM | POA: Diagnosis not present

## 2018-05-19 LAB — BASIC METABOLIC PANEL
BUN/Creatinine Ratio: 25 (calc) — ABNORMAL HIGH (ref 6–22)
BUN: 25 mg/dL (ref 7–25)
CO2: 28 mmol/L (ref 20–32)
Calcium: 9.3 mg/dL (ref 8.6–10.4)
Chloride: 105 mmol/L (ref 98–110)
Creat: 0.99 mg/dL — ABNORMAL HIGH (ref 0.60–0.93)
Glucose, Bld: 118 mg/dL — ABNORMAL HIGH (ref 65–99)
Potassium: 5.6 mmol/L — ABNORMAL HIGH (ref 3.5–5.3)
Sodium: 140 mmol/L (ref 135–146)

## 2018-05-19 LAB — CBC
HCT: 41.4 % (ref 35.0–45.0)
Hemoglobin: 14 g/dL (ref 11.7–15.5)
MCH: 29.7 pg (ref 27.0–33.0)
MCHC: 33.8 g/dL (ref 32.0–36.0)
MCV: 87.9 fL (ref 80.0–100.0)
MPV: 10.2 fL (ref 7.5–12.5)
Platelets: 377 10*3/uL (ref 140–400)
RBC: 4.71 10*6/uL (ref 3.80–5.10)
RDW: 12.9 % (ref 11.0–15.0)
WBC: 6.8 10*3/uL (ref 3.8–10.8)

## 2018-05-19 LAB — LIPID PANEL
Cholesterol: 260 mg/dL — ABNORMAL HIGH (ref <200)
HDL: 43 mg/dL — ABNORMAL LOW (ref >50)
LDL Cholesterol (Calc): 180 mg/dL (calc) — ABNORMAL HIGH
Non-HDL Cholesterol (Calc): 217 mg/dL (calc) — ABNORMAL HIGH (ref <130)
Total CHOL/HDL Ratio: 6 (calc) — ABNORMAL HIGH (ref <5.0)
Triglycerides: 201 mg/dL — ABNORMAL HIGH (ref <150)

## 2018-05-19 NOTE — Telephone Encounter (Signed)
Left detailed message per DPR:  Patient contacted pre-catheterization at [facility] scheduled for:  05/23/2018 at 7:30 am Verified arrival time and place:  5:30 am at Admitting Confirmed AM meds to be taken pre-cath with sip of water: Take ASA/Plavix Confirmed patient has responsible person to drive home post procedure and observe patient for 24 hours:  yes  Left this nurse name and # for call back if any addl questions.

## 2018-05-23 ENCOUNTER — Encounter (HOSPITAL_COMMUNITY): Payer: Self-pay | Admitting: Cardiovascular Disease

## 2018-05-23 ENCOUNTER — Ambulatory Visit (HOSPITAL_COMMUNITY)
Admission: RE | Admit: 2018-05-23 | Discharge: 2018-05-23 | Disposition: A | Discharge status: Home / Self Care | Payer: Medicare HMO | Attending: Cardiovascular Disease | Admitting: Cardiovascular Disease

## 2018-05-23 ENCOUNTER — Other Ambulatory Visit: Payer: Self-pay

## 2018-05-23 ENCOUNTER — Encounter (HOSPITAL_COMMUNITY)
Admission: RE | Disposition: A | Discharge status: Home / Self Care | Payer: Self-pay | Source: Home / Self Care | Attending: Cardiovascular Disease

## 2018-05-23 ENCOUNTER — Telehealth: Payer: Self-pay

## 2018-05-23 DIAGNOSIS — E785 Hyperlipidemia, unspecified: Secondary | ICD-10-CM | POA: Diagnosis not present

## 2018-05-23 DIAGNOSIS — I209 Angina pectoris, unspecified: Secondary | ICD-10-CM

## 2018-05-23 DIAGNOSIS — Z90710 Acquired absence of both cervix and uterus: Secondary | ICD-10-CM | POA: Insufficient documentation

## 2018-05-23 DIAGNOSIS — Z881 Allergy status to other antibiotic agents status: Secondary | ICD-10-CM | POA: Diagnosis not present

## 2018-05-23 DIAGNOSIS — E1140 Type 2 diabetes mellitus with diabetic neuropathy, unspecified: Secondary | ICD-10-CM | POA: Diagnosis not present

## 2018-05-23 DIAGNOSIS — M797 Fibromyalgia: Secondary | ICD-10-CM | POA: Diagnosis not present

## 2018-05-23 DIAGNOSIS — Z79899 Other long term (current) drug therapy: Secondary | ICD-10-CM | POA: Insufficient documentation

## 2018-05-23 DIAGNOSIS — Z888 Allergy status to other drugs, medicaments and biological substances status: Secondary | ICD-10-CM | POA: Diagnosis not present

## 2018-05-23 DIAGNOSIS — I10 Essential (primary) hypertension: Secondary | ICD-10-CM | POA: Diagnosis not present

## 2018-05-23 DIAGNOSIS — Z7902 Long term (current) use of antithrombotics/antiplatelets: Secondary | ICD-10-CM | POA: Insufficient documentation

## 2018-05-23 DIAGNOSIS — M1990 Unspecified osteoarthritis, unspecified site: Secondary | ICD-10-CM | POA: Diagnosis not present

## 2018-05-23 DIAGNOSIS — Z8673 Personal history of transient ischemic attack (TIA), and cerebral infarction without residual deficits: Secondary | ICD-10-CM | POA: Insufficient documentation

## 2018-05-23 DIAGNOSIS — J449 Chronic obstructive pulmonary disease, unspecified: Secondary | ICD-10-CM | POA: Insufficient documentation

## 2018-05-23 DIAGNOSIS — I25111 Atherosclerotic heart disease of native coronary artery with angina pectoris with documented spasm: Secondary | ICD-10-CM | POA: Diagnosis not present

## 2018-05-23 DIAGNOSIS — R931 Abnormal findings on diagnostic imaging of heart and coronary circulation: Secondary | ICD-10-CM

## 2018-05-23 DIAGNOSIS — K219 Gastro-esophageal reflux disease without esophagitis: Secondary | ICD-10-CM | POA: Insufficient documentation

## 2018-05-23 DIAGNOSIS — Z7951 Long term (current) use of inhaled steroids: Secondary | ICD-10-CM | POA: Insufficient documentation

## 2018-05-23 DIAGNOSIS — I2510 Atherosclerotic heart disease of native coronary artery without angina pectoris: Secondary | ICD-10-CM

## 2018-05-23 HISTORY — PX: LEFT HEART CATH AND CORONARY ANGIOGRAPHY: CATH118249

## 2018-05-23 LAB — POCT I-STAT 4, (NA,K, GLUC, HGB,HCT)
Glucose, Bld: 174 mg/dL — ABNORMAL HIGH (ref 70–99)
HCT: 39 % (ref 36.0–46.0)
Hemoglobin: 13.3 g/dL (ref 12.0–15.0)
POTASSIUM: 4.3 mmol/L (ref 3.5–5.1)
Sodium: 136 mmol/L (ref 135–145)

## 2018-05-23 LAB — GLUCOSE, CAPILLARY
Glucose-Capillary: 162 mg/dL — ABNORMAL HIGH (ref 70–99)
Glucose-Capillary: 129 mg/dL — ABNORMAL HIGH (ref 70–99)

## 2018-05-23 SURGERY — LEFT HEART CATH AND CORONARY ANGIOGRAPHY
Anesthesia: LOCAL

## 2018-05-23 MED ORDER — SODIUM CHLORIDE 0.9% FLUSH: 3.0000 mL | Freq: Two times a day (BID) | INTRAVENOUS | Status: DC

## 2018-05-23 MED ORDER — ISOSORBIDE MONONITRATE ER 30 MG PO TB24: 30.0000 mg | ORAL_TABLET | Freq: Every day | ORAL | Status: DC

## 2018-05-23 MED ORDER — HEPARIN (PORCINE) IN NACL 1000-0.9 UT/500ML-% IV SOLN
INTRAVENOUS | Status: DC | PRN
  Administered 2018-05-23 (×2): 500 mL

## 2018-05-23 MED ORDER — ACETAMINOPHEN 325 MG PO TABS: 650.0000 mg | ORAL_TABLET | ORAL | Status: DC | PRN

## 2018-05-23 MED ORDER — HEPARIN SODIUM (PORCINE) 1000 UNIT/ML IJ SOLN
INTRAMUSCULAR | Status: DC | PRN
  Administered 2018-05-23: 4000 [IU] via INTRAVENOUS

## 2018-05-23 MED ORDER — MIDAZOLAM HCL 2 MG/2ML IJ SOLN
INTRAMUSCULAR | Status: AC
  Filled 2018-05-23: qty 2

## 2018-05-23 MED ORDER — SODIUM CHLORIDE 0.9% FLUSH: 3.0000 mL | INTRAVENOUS | Status: DC | PRN

## 2018-05-23 MED ORDER — VERAPAMIL HCL 2.5 MG/ML IV SOLN
INTRAVENOUS | Status: DC | PRN
  Administered 2018-05-23: 10 mL via INTRA_ARTERIAL

## 2018-05-23 MED ORDER — SODIUM CHLORIDE 0.9 % IV SOLN: 250.0000 mL | INTRAVENOUS | Status: DC | PRN

## 2018-05-23 MED ORDER — HEPARIN SODIUM (PORCINE) 1000 UNIT/ML IJ SOLN
INTRAMUSCULAR | Status: AC
  Filled 2018-05-23: qty 1

## 2018-05-23 MED ORDER — SODIUM CHLORIDE 0.9 % WEIGHT BASED INFUSION
3.0000 mL/kg/h | INTRAVENOUS | Status: DC
  Administered 2018-05-23: 3 mL/kg/h via INTRAVENOUS

## 2018-05-23 MED ORDER — CARVEDILOL 6.25 MG PO TABS: 6.2500 mg | ORAL_TABLET | Freq: Two times a day (BID) | ORAL | Status: DC

## 2018-05-23 MED ORDER — ASPIRIN 81 MG PO CHEW
81.0000 mg | CHEWABLE_TABLET | ORAL | Status: AC
  Administered 2018-05-23: 81 mg via ORAL
  Filled 2018-05-23: qty 1

## 2018-05-23 MED ORDER — MIDAZOLAM HCL 2 MG/2ML IJ SOLN
INTRAMUSCULAR | Status: DC | PRN
  Administered 2018-05-23: 2 mg via INTRAVENOUS

## 2018-05-23 MED ORDER — VERAPAMIL HCL 2.5 MG/ML IV SOLN
INTRAVENOUS | Status: AC
  Filled 2018-05-23: qty 2

## 2018-05-23 MED ORDER — LIDOCAINE HCL (PF) 1 % IJ SOLN
INTRAMUSCULAR | Status: AC
  Filled 2018-05-23: qty 30

## 2018-05-23 MED ORDER — SODIUM CHLORIDE 0.9 % IV SOLN: INTRAVENOUS | Status: DC

## 2018-05-23 MED ORDER — HEPARIN (PORCINE) IN NACL 1000-0.9 UT/500ML-% IV SOLN
INTRAVENOUS | Status: AC
  Filled 2018-05-23: qty 1000

## 2018-05-23 MED ORDER — FENTANYL CITRATE (PF) 100 MCG/2ML IJ SOLN
INTRAMUSCULAR | Status: AC
  Filled 2018-05-23: qty 2

## 2018-05-23 MED ORDER — FENTANYL CITRATE (PF) 100 MCG/2ML IJ SOLN
INTRAMUSCULAR | Status: DC | PRN
  Administered 2018-05-23: 25 ug via INTRAVENOUS

## 2018-05-23 MED ORDER — ONDANSETRON HCL 4 MG/2ML IJ SOLN: 4.0000 mg | Freq: Four times a day (QID) | INTRAMUSCULAR | Status: DC | PRN

## 2018-05-23 MED ORDER — SODIUM CHLORIDE 0.9 % WEIGHT BASED INFUSION: 1.0000 mL/kg/h | INTRAVENOUS | Status: DC

## 2018-05-23 MED ORDER — HYDRALAZINE HCL 20 MG/ML IJ SOLN
INTRAMUSCULAR | Status: DC | PRN
  Administered 2018-05-23: 10 mg via INTRAVENOUS

## 2018-05-23 MED ORDER — DIAZEPAM 5 MG PO TABS: 5.0000 mg | ORAL_TABLET | ORAL | Status: DC | PRN

## 2018-05-23 MED ORDER — AMLODIPINE BESYLATE 5 MG PO TABS: 5.0000 mg | ORAL_TABLET | Freq: Every day | ORAL | Status: DC

## 2018-05-23 MED ORDER — LIDOCAINE HCL (PF) 1 % IJ SOLN
INTRAMUSCULAR | Status: DC | PRN
  Administered 2018-05-23: 2 mL via INTRADERMAL

## 2018-05-23 MED ORDER — IOHEXOL 350 MG/ML SOLN
INTRAVENOUS | Status: DC | PRN
  Administered 2018-05-23: 120 mL via INTRA_ARTERIAL

## 2018-05-23 SURGICAL SUPPLY — 12 items
CATH INFINITI JR4 5F (CATHETERS) ×1 IMPLANT
CATH LAUNCHER 5F EBU3.5 (CATHETERS) ×1 IMPLANT
CATH OPTITORQUE TIG 4.0 5F (CATHETERS) ×1 IMPLANT
DEVICE RAD COMP TR BAND LRG (VASCULAR PRODUCTS) ×1 IMPLANT
GLIDESHEATH SLEND SS 6F .021 (SHEATH) ×1 IMPLANT
GUIDEWIRE INQWIRE 1.5J.035X260 (WIRE) IMPLANT
INQWIRE 1.5J .035X260CM (WIRE) ×2
KIT HEART LEFT (KITS) ×2 IMPLANT
PACK CARDIAC CATHETERIZATION (CUSTOM PROCEDURE TRAY) ×2 IMPLANT
TRANSDUCER W/STOPCOCK (MISCELLANEOUS) ×2 IMPLANT
TUBING CIL FLEX 10 FLL-RA (TUBING) ×2 IMPLANT
WIRE HI TORQ VERSACORE-J 145CM (WIRE) ×1 IMPLANT

## 2018-05-23 NOTE — Telephone Encounter (Addendum)
-----   Message from Herminio Commons, MD sent at 05/23/2018  4:05 PM EST ----- Please make a referral to CT surgery to see if she is a CABG candidate. Thanks.  Dr. Bronson Ing ----- Message ----- From: Troy Sine, MD Sent: 05/23/2018  10:46 AM EST To: Herminio Commons, MD, Eustaquio Maize, MD   Ref placed to [facility] for consult, pt made aware    Pt states she takes no OTC potassium

## 2018-05-23 NOTE — Discharge Instructions (Signed)
Drink plenty of fluids over next 48 hours and keep right wrist elevated at heart level for 24 hours  Radial Site Care  This sheet gives you information about how to care for yourself after your procedure. Your health care provider may also give you more specific instructions. If you have problems or questions, contact your health care provider. What can I expect after the procedure? After the procedure, it is common to have:  Bruising and tenderness at the catheter insertion area. Follow these instructions at home: Medicines  Take over-the-counter and prescription medicines only as told by your health care provider. Insertion site care  Follow instructions from your health care provider about how to take care of your insertion site. Make sure you: ? Wash your hands with soap and water before you change your bandage (dressing). If soap and water are not available, use hand sanitizer. ? Change your dressing as told by your health care provider. ? Leave stitches (sutures), skin glue, or adhesive strips in place. These skin closures may need to stay in place for 2 weeks or longer. If adhesive strip edges start to loosen and curl up, you may trim the loose edges. Do not remove adhesive strips completely unless your health care provider tells you to do that.  Check your insertion site every day for signs of infection. Check for: ? Redness, swelling, or pain. ? Fluid or blood. ? Pus or a bad smell. ? Warmth.  Do not take baths, swim, or use a hot tub until your health care provider approves.  You may shower 24-48 hours after the procedure, or as directed by your health care provider. ? Remove the dressing and gently wash the site with plain soap and water. ? Pat the area dry with a clean towel. ? Do not rub the site. That could cause bleeding.  Do not apply powder or lotion to the site. Activity   For 24 hours after the procedure, or as directed by your health care provider: ? Do not  flex or bend the affected arm. ? Do not push or pull heavy objects with the affected arm. ? Do not drive yourself home from the hospital or clinic. You may drive 24 hours after the procedure unless your health care provider tells you not to. ? Do not operate machinery or power tools.  Do not lift anything that is heavier than 10 lb (4.5 kg), or the limit that you are told, until your health care provider says that it is safe.  Ask your health care provider when it is okay to: ? Return to work or school. ? Resume usual physical activities or sports. ? Resume sexual activity. General instructions  If the catheter site starts to bleed, raise your arm and put firm pressure on the site. If the bleeding does not stop, get help right away. This is a medical emergency.  If you went home on the same day as your procedure, a responsible adult should be with you for the first 24 hours after you arrive home.  Keep all follow-up visits as told by your health care provider. This is important. Contact a health care provider if:  You have a fever.  You have redness, swelling, or yellow drainage around your insertion site. Get help right away if:  You have unusual pain at the radial site.  The catheter insertion area swells very fast.  The insertion area is bleeding, and the bleeding does not stop when you hold steady pressure on  the area.  Your arm or hand becomes pale, cool, tingly, or numb. These symptoms may represent a serious problem that is an emergency. Do not wait to see if the symptoms will go away. Get medical help right away. Call your local emergency services (911 in the [place] Do not drive yourself to the hospital. Summary  After the procedure, it is common to have bruising and tenderness at the site.  Follow instructions from your health care provider about how to take care of your radial site wound. Check the wound every day for signs of infection.  Do not lift anything that is  heavier than 10 lb (4.5 kg), or the limit that you are told, until your health care provider says that it is safe. This information is not intended to replace advice given to you by your health care provider. Make sure you discuss any questions you have with your health care provider. Document Released: 06/05/2010 Document Revised: 06/08/2017 Document Reviewed: 06/08/2017 Elsevier Interactive Patient Education  2019 [employer].

## 2018-05-23 NOTE — Interval H&P Note (Signed)
Cath Lab Visit (complete for each Cath Lab visit)  Clinical Evaluation Leading to the Procedure:   ACS: No.  Non-ACS:    Anginal Classification: CCS III  Anti-ischemic medical therapy: Minimal Therapy (1 class of medications)  Non-Invasive Test Results: High-risk stress test findings: cardiac mortality >3%/year  Prior CABG: No previous CABG      History and Physical Interval Note:  05/23/2018 9:05 AM  Susan Patel  has presented today for surgery, with the diagnosis of CP  The various methods of treatment have been discussed with the patient and family. After consideration of risks, benefits and other options for treatment, the patient has consented to  Procedure(s): LEFT HEART CATH AND CORONARY ANGIOGRAPHY (N/A) as a surgical intervention .  The patient's history has been reviewed, patient examined, no change in status, stable for surgery.  I have reviewed the patient's chart and labs.  Questions were answered to the patient's satisfaction.     Shelva Majestic

## 2018-05-24 ENCOUNTER — Telehealth: Payer: Self-pay

## 2018-05-24 DIAGNOSIS — I25118 Atherosclerotic heart disease of native coronary artery with other forms of angina pectoris: Secondary | ICD-10-CM

## 2018-05-24 MED ORDER — CARVEDILOL 6.25 MG PO TABS: 6.2500 mg | ORAL_TABLET | Freq: Two times a day (BID) | ORAL | 3 refills | Status: DC

## 2018-05-24 MED ORDER — ISOSORBIDE MONONITRATE ER 30 MG PO TB24: 30.0000 mg | ORAL_TABLET | Freq: Every day | ORAL | 3 refills | Status: DC

## 2018-05-24 NOTE — Telephone Encounter (Signed)
Pt made aware. Rx sent in. Order placed for echo- notified front office to schedule.

## 2018-05-24 NOTE — Telephone Encounter (Signed)
Increase Coreg to 6.25 mg bid. Start Imdur 30 mg daily. Get echocardiogram for CAD. Start Repatha as LDL markedly elevated on Crestor 40 mg.

## 2018-05-24 NOTE — Telephone Encounter (Signed)
Pt's husband called in stating that yesterday after his wife's heart cath he was told that they would increase his wife's medication. He did not get a script nor was he told which ones to increase. I looked back and tried to figure out which one it was, but I could not find it. Please advise.

## 2018-05-24 NOTE — Addendum Note (Signed)
Addended by: Debbora Lacrosse R on: 05/24/2018 04:36 PM   Modules accepted: Orders

## 2018-05-29 ENCOUNTER — Telehealth: Payer: Self-pay | Admitting: Cardiovascular Disease

## 2018-05-29 MED ORDER — EVOLOCUMAB 140 MG/ML ~~LOC~~ SOAJ: 140.0000 mg | SUBCUTANEOUS | 11 refills | Status: DC

## 2018-05-29 NOTE — Telephone Encounter (Signed)
I spoke with husband and explained what an echo was and why we get one.Spouse was satisfied and stated he would speak with wife

## 2018-05-29 NOTE — Telephone Encounter (Signed)
Patient's husband calling to ask what echocardiogram was and what it "would show"./ tg

## 2018-05-30 ENCOUNTER — Ambulatory Visit (HOSPITAL_COMMUNITY)
Admission: RE | Admit: 2018-05-30 | Discharge: 2018-05-30 | Disposition: A | Discharge status: Home / Self Care | Payer: Medicare HMO | Source: Ambulatory Visit | Attending: Cardiovascular Disease | Admitting: Cardiovascular Disease

## 2018-05-30 DIAGNOSIS — I25118 Atherosclerotic heart disease of native coronary artery with other forms of angina pectoris: Secondary | ICD-10-CM | POA: Diagnosis not present

## 2018-05-30 NOTE — Progress Notes (Signed)
*  PRELIMINARY RESULTS* Echocardiogram 2D Echocardiogram has been performed.  Susan Patel 05/30/2018, 3:59 PM

## 2018-05-31 ENCOUNTER — Other Ambulatory Visit: Payer: Self-pay | Admitting: Pediatrics

## 2018-05-31 DIAGNOSIS — I6990 Unspecified sequelae of unspecified cerebrovascular disease: Secondary | ICD-10-CM

## 2018-06-02 ENCOUNTER — Encounter: Payer: Medicare HMO | Admitting: Cardiothoracic Surgery

## 2018-06-02 ENCOUNTER — Institutional Professional Consult (permissible substitution) (INDEPENDENT_AMBULATORY_CARE_PROVIDER_SITE_OTHER): Payer: Medicare HMO | Admitting: Cardiothoracic Surgery

## 2018-06-02 ENCOUNTER — Encounter: Payer: Self-pay | Admitting: Family Medicine

## 2018-06-02 ENCOUNTER — Ambulatory Visit (INDEPENDENT_AMBULATORY_CARE_PROVIDER_SITE_OTHER): Payer: Medicare HMO | Admitting: Family Medicine

## 2018-06-02 VITALS — BP 137/80 | HR 68 | Temp 97.1°F | Ht 61.0 in | Wt 203.0 lb

## 2018-06-02 VITALS — BP 145/85 | HR 63 | Resp 18 | Ht 61.0 in | Wt 200.0 lb

## 2018-06-02 DIAGNOSIS — J441 Chronic obstructive pulmonary disease with (acute) exacerbation: Secondary | ICD-10-CM

## 2018-06-02 DIAGNOSIS — I2510 Atherosclerotic heart disease of native coronary artery without angina pectoris: Secondary | ICD-10-CM

## 2018-06-02 MED ORDER — METHYLPREDNISOLONE ACETATE 80 MG/ML IJ SUSP
80.0000 mg | Freq: Once | INTRAMUSCULAR | Status: AC
  Administered 2018-06-02: 80 mg via INTRAMUSCULAR

## 2018-06-02 MED ORDER — LEVOFLOXACIN 500 MG PO TABS: 500.0000 mg | ORAL_TABLET | Freq: Every day | ORAL | 0 refills | Status: AC

## 2018-06-02 NOTE — Progress Notes (Signed)
Subjective:    Patient ID: Susan Patel, female    DOB: 15-Jan-1941, 78 y.o.   MRN: [id number]  Chief Complaint:  Chest congestion, cough (ongoing for quite a while, has been on several antibiotics)   HPI: Susan Patel is a 78 y.o. female presenting on 06/02/2018 for Chest congestion, cough (ongoing for quite a while, has been on several antibiotics)  Pt presents today with complaints of increased cough, shortness of breath, wheezing, and need for oxygen. Pt states this has been ongoing for several weeks and is not getting better. Pt states she recently finished a round of oral antibiotics and prednisone. States she has not improved since this. Denies fever, confusion, or weakness. She does have fatigue.   Relevant past medical, surgical, family, and social history reviewed and updated as indicated.  Allergies and medications reviewed and updated.   Past Medical History:  Diagnosis Date  . Arthritis    'all over"  . Asthma   . Chronic pain   . COPD (chronic obstructive pulmonary disease) (HCC)    O2 per [place] at nights   . Depression   . Diabetes mellitus without complication ([place])    borderline- , states she was on med., but MD told her "everything is under control so I threw the bottle away"  . Fibromyalgia   . GERD (gastroesophageal reflux disease)   . Hypertension   . Neuromuscular disorder (HCC)    parkinson, neuropathy- both feet & hands.  . 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 ABDOMINAL HYSTERECTOMY    . ANTERIOR CERVICAL DECOMP/DISCECTOMY FUSION N/A 07/10/2015   Procedure: Cervical five-six, Cervical six-seven anterior cervical decompression with fusion plating and bonegraft;  Surgeon: Jovita Gamma, MD;  Location: [place] [facility];  Service: Neurosurgery;  Laterality: N/A;  . APPENDECTOMY    . BIOPSY EYE MUSCLE  03/20/2016   biopsy vessel to right eye due to swelling  . EYE SURGERY Bilateral    cataracts  removed, /w "cyrstal lenses"   . LEFT HEART CATH AND CORONARY ANGIOGRAPHY N/A 05/23/2018   Procedure: LEFT HEART CATH AND CORONARY ANGIOGRAPHY;  Surgeon: Troy Sine, MD;  Location: [place] [facility];  Service: Cardiovascular;  Laterality: N/A;  . SHOULDER ARTHROSCOPY Right    x2   RCR- spurs removed     Social History   Socioeconomic History  . Marital status: Married    Spouse name: wayne  . Number of children: 2  . Years of education: 16  . Highest education level: Bachelor's degree (e.g., BA, AB, BS)  Occupational History  . Occupation: retired    Comment: Automotive engineer  Social Needs  . Financial resource strain: Not hard at all  . Food insecurity:    Worry: Never true    Inability: Never true  . Transportation needs:    Medical: No    Non-medical: No  Tobacco Use  . Smoking status: Never Smoker  . Smokeless tobacco: Never Used  Substance and Sexual Activity  . Alcohol use: No  . Drug use: No  . Sexual activity: Yes  Lifestyle  . Physical activity:    Days per week: 0 days    Minutes per session: 0 min  . Stress: Not at all  Relationships  . Social connections:    Talks on phone: More than three times a week    Gets together: More than three times a week  Attends religious service: More than 4 times per year    Active member of club or organization: Yes    Attends meetings of clubs or organizations: More than 4 times per year    Relationship status: Married  . Intimate partner violence:    Fear of current or ex partner: Not on file    Emotionally abused: Not on file    Physically abused: Not on file    Forced sexual activity: Not on file  Other Topics Concern  . Not on file  Social History Narrative  . Not on file    Outpatient Encounter Medications as of 06/02/2018  Medication Sig  . acetaminophen (TYLENOL) 325 MG tablet Take 2 tablets (650 mg total) by mouth every 6 (six) hours as needed. (Patient taking differently: Take 650 mg by  mouth every 6 (six) hours as needed for moderate pain or headache. )  . albuterol (PROVENTIL HFA;VENTOLIN HFA) 108 (90 Base) MCG/ACT inhaler Inhale 1-2 puffs into the lungs every 6 (six) hours as needed for wheezing or shortness of breath.  . carvedilol (COREG) 6.25 MG tablet Take 1 tablet (6.25 mg total) by mouth 2 (two) times daily.  . cetirizine (ZYRTEC) 10 MG tablet Take 10 mg by mouth 2 (two) times daily as needed for allergies.  Marland Kitchen clopidogrel (PLAVIX) 75 MG tablet TAKE 1 TABLET BY MOUTH EVERY DAY  . DULoxetine (CYMBALTA) 30 MG capsule TAKE 2 CAPSULES (60 MG TOTAL) BY MOUTH DAILY.  . famotidine (PEPCID) 20 MG tablet TAKE 1 TABLET (20 MG TOTAL) 2 (TWO) TIMES DAILY AS NEEDED BY MOUTH FOR HEARTBURN OR INDIGESTION.  . fluticasone (FLONASE) 50 MCG/ACT nasal spray Place 2 sprays into both nostrils daily. (Patient taking differently: Place 2 sprays into both nostrils daily as needed for allergies. )  . fluticasone furoate-vilanterol (BREO ELLIPTA) 200-25 MCG/INH AEPB Inhale 1 puff into the lungs daily. (Patient taking differently: Inhale 1 puff into the lungs daily as needed (shortness). )  . gabapentin (NEURONTIN) 300 MG capsule TAKE 1 CAPSULE BY MOUTH THREE TIMES A DAY (Patient taking differently: Take 300 mg by mouth 2 (two) times daily. )  . glucose blood test strip Use as instructed  . isosorbide mononitrate (IMDUR) 30 MG 24 hr tablet Take 1 tablet (30 mg total) by mouth daily.  . Lancets (ONETOUCH ULTRASOFT) lancets Use as instructed  . latanoprost (XALATAN) 0.005 % ophthalmic solution Place 1 drop into both eyes at bedtime.  . Lidocaine 4 % PTCH Apply 1 patch topically 2 (two) times daily. (Patient taking differently: Apply 1 patch topically 2 (two) times daily as needed (pain). )  . LINZESS 290 MCG CAPS capsule TAKE 1 CAPSULE BY MOUTH DAILY BEFORE BREAKFAST. (Patient taking differently: Take 290 mcg by mouth daily. )  . lisinopril (PRINIVIL,ZESTRIL) 20 MG tablet TAKE 2 TABLETS BY MOUTH EVERY  DAY (Patient taking differently: Take 20 mg by mouth 2 (two) times daily. )  . montelukast (SINGULAIR) 10 MG tablet TAKE 1 TABLET BY MOUTH EVERYDAY AT BEDTIME  . nabumetone (RELAFEN) 500 MG tablet Take 1 tablet (500 mg total) by mouth 2 (two) times daily. For muscle and joint pain  . naproxen sodium (ALEVE) 220 MG tablet Take 440 mg by mouth 2 (two) times daily as needed (pain).  . nitroGLYCERIN (NITROSTAT) 0.4 MG SL tablet Place 1 tablet (0.4 mg total) under the tongue every 5 (five) minutes as needed.  Glory Rosebush DELICA LANCETS FINE MISC TEST TWICE DAILY  . OXYGEN Inhale 3-4 L into  the lungs at bedtime.  . rosuvastatin (CRESTOR) 40 MG tablet Take 1 tablet (40 mg total) by mouth every evening.  . tolterodine (DETROL) 1 MG tablet TAKE 1 TABLET BY MOUTH TWICE A DAY  . traMADol (ULTRAM) 50 MG tablet TAKE 1 TABLET EVERY 12 HOURS AS NEEDED FOR MODERATE OR SEVERE PAIN (Patient taking differently: Take 50 mg by mouth 2 (two) times daily. )  . traZODone (DESYREL) 50 MG tablet TAKE 1 TABLET (50 MG TOTAL) BY MOUTH NIGHTLY AS NEEDED FOR FOR SLEEP. (Patient taking differently: Take 50 mg by mouth at bedtime. )  . Triamcinolone Acetonide (TRIAMCINOLONE 0.1 % CREAM : EUCERIN) CREA Apply 1 application topically 2 (two) times daily.  Marland Kitchen triamcinolone cream (KENALOG) 0.1 % APPLY TOPICALLY TWO TIMES DAILY (Patient taking differently: Apply 1 application topically 2 (two) times daily as needed (rash). )  . levofloxacin (LEVAQUIN) 500 MG tablet Take 1 tablet (500 mg total) by mouth daily for 7 days.   Facility-Administered Encounter Medications as of 06/02/2018  Medication  . methylPREDNISolone acetate (DEPO-MEDROL) injection 80 mg    Allergies  Allergen Reactions  . Azithromycin Other (See Comments)    Renal failure  . Prednisone Other (See Comments)    Irritability and insomnia  . Sulfamethoxazole-Trimethoprim Other (See Comments)    Renal failure  . Amlodipine     Caused swelling in legs  . Ciprofloxacin  Itching and Rash  . Tape Rash    Review of Systems  Constitutional: Positive for fatigue. Negative for chills and fever.  HENT: Positive for congestion.   Respiratory: Positive for cough, shortness of breath and wheezing.   Cardiovascular: Negative for chest pain and palpitations.  Neurological: Negative for weakness.  Psychiatric/Behavioral: Negative for confusion.  All other systems reviewed and are negative.       Objective:    BP 137/80   Pulse 68   Temp (!) 97.1 F (36.2 C) (Oral)   Ht 5\' 1"  (1.549 m)   Wt 203 lb (92.1 kg)   SpO2 99%   BMI 38.36 kg/m    Wt Readings from Last 3 Encounters:  06/02/18 203 lb (92.1 kg)  06/02/18 200 lb (90.7 kg)  05/23/18 183 lb (83 kg)    Physical Exam Vitals signs and nursing note reviewed.  Constitutional:      General: She is in acute distress (mild).     Appearance: Normal appearance. She is not ill-appearing or toxic-appearing.  HENT:     Head: Normocephalic and atraumatic.     Right Ear: Hearing, tympanic membrane, ear canal and external ear normal.     Left Ear: Hearing, tympanic membrane, ear canal and external ear normal.     Mouth/Throat:     Lips: Pink.     Mouth: Mucous membranes are moist.     Pharynx: Oropharynx is clear.  Eyes:     General: Lids are normal.     Conjunctiva/sclera: Conjunctivae normal.     Pupils: Pupils are equal, round, and reactive to light.  Neck:     Musculoskeletal: Neck supple.  Cardiovascular:     Rate and Rhythm: Normal rate and regular rhythm.     Heart sounds: Normal heart sounds.  Pulmonary:     Effort: No respiratory distress.     Breath sounds: Decreased breath sounds and wheezing (scattered, mild) present.  Lymphadenopathy:     Cervical: No cervical adenopathy.  Skin:    General: Skin is warm and dry.     Capillary Refill:  Capillary refill takes less than 2 seconds.     Coloration: Skin is not cyanotic.  Neurological:     General: No focal deficit present.     Mental  Status: She is alert and oriented to person, place, and time.  Psychiatric:        Mood and Affect: Mood normal.        Behavior: Behavior normal. Behavior is cooperative.        Thought Content: Thought content normal.        Judgment: Judgment normal.     Results for orders placed or performed during the hospital encounter of 05/23/18  Glucose, capillary  Result Value Ref Range   Glucose-Capillary 162 (H) 70 - 99 mg/dL  Glucose, capillary  Result Value Ref Range   Glucose-Capillary 129 (H) 70 - 99 mg/dL  I-STAT 4, (NA,K, GLUC, HGB,HCT)  Result Value Ref Range   Sodium 136 135 - 145 mmol/L   Potassium 4.3 3.5 - 5.1 mmol/L   Glucose, Bld 174 (H) 70 - 99 mg/dL   HCT 39.0 36.0 - 46.0 %   Hemoglobin 13.3 12.0 - 15.0 g/dL       Pertinent labs & imaging results that were available during my care of the patient were reviewed by me and considered in my medical decision making.  Assessment & Plan:  Jenesa was seen today for chest congestion, cough.  Diagnoses and all orders for this visit:  COPD with acute exacerbation ([place]) Increase fluid intake. Due to recent amoxicillin therapy, will treat with Levaquin. Pt has an intolerance to Cipro - itching without SHOB or throat swelling. Report any new or worsening symptoms. Return in 2 weeks for reevaluation.  -     methylPREDNISolone acetate (DEPO-MEDROL) injection 80 mg -     levofloxacin (LEVAQUIN) 500 MG tablet; Take 1 tablet (500 mg total) by mouth daily for 7 days.     Continue all other maintenance medications.  Follow up plan: Return in about 2 weeks (around 06/16/2018), or if symptoms worsen or fail to improve.  Educational handout given for COPD exacerbation.   The above assessment and management plan was discussed with the patient. The patient verbalized understanding of and has agreed to the management plan. Patient is aware to call the clinic if symptoms persist or worsen. Patient is aware when to return to the clinic for a  follow-up visit. Patient educated on when it is appropriate to go to the emergency department.   Monia Pouch, FNP-C [facility] [phone]

## 2018-06-02 NOTE — Patient Instructions (Signed)

## 2018-06-02 NOTE — Progress Notes (Signed)
[address]       [place] [address]             [phone]                    [name] Medical Record #[id number] Date of Birth: 01-19-1941  Referring: Herminio Commons, MD Primary Care: Eustaquio Maize, MD Primary Cardiologist: Kate Sable, MD  Chief Complaint:    Chief Complaint  Patient presents with  . Coronary Artery Disease    Surgical eval, Cardiac Cath 05/23/18, ECHO 05/30/18    History of Present Illness:    Susan Patel 78 y.o. female is seen in the office  today for referred by Dr. Claiborne Billings and Dr. Bronson Ing for consideration of coronary artery bypass grafting  In August 2019 the patient underwent coronary CT angiography which was abnormal and suggested proximal RCA and LAD disease. FFR was positive. Patient has a remote history of CVA x2. She has experienced chest pain symptomatology consistent with angina.  She had taken some nitroglycerin in the past, but notes since started on [place] she has had no further angina. The patient has no previous history of myocardial infarction.  She does use oxygen at home especially at nighttime for the past 4 to 5 years due to shortness of breath and desaturations.  November 2017 and Oct 03, 2016 she had strokes which have left her with significant difficulty with speech, and overall weakness and difficulty with mobility.    Current Activity/ Functional Status:  Patient is not independent with mobility/ambulation, transfers, ADL's, IADL's.   Zubrod Score: At the time of surgery this patient's most appropriate activity status/level should be described as: []     0    Normal activity, no symptoms []     1    Restricted in physical strenuous activity but ambulatory, able to do out light work []     2    Ambulatory and capable of self care, unable to do work activities, up and about               >50 % of waking hours                              [x]     3    Only limited self care, in bed  greater than 50% of waking hours []     4    Completely disabled, no self care, confined to bed or chair []     5    Moribund   Past Medical History:  Diagnosis Date  . Arthritis    'all over"  . Asthma   . Chronic pain   . COPD (chronic obstructive pulmonary disease) (HCC)    O2 per [place] at nights   . Depression   . Diabetes mellitus without complication ([place])    borderline- , states she was on med., but MD told her "everything is under control so I threw the bottle away"  . Fibromyalgia   . GERD (gastroesophageal reflux disease)   . Hypertension   . Neuromuscular disorder (HCC)    parkinson, neuropathy- both feet & hands.  . 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 ABDOMINAL HYSTERECTOMY    . ANTERIOR CERVICAL DECOMP/DISCECTOMY FUSION N/A 07/10/2015   Procedure: Cervical five-six, Cervical six-seven anterior cervical decompression with  fusion plating and bonegraft;  Surgeon: Jovita Gamma, MD;  Location: [facility];  Service: Neurosurgery;  Laterality: N/A;  . APPENDECTOMY    . BIOPSY EYE MUSCLE  03/20/2016   biopsy vessel to right eye due to swelling  . EYE SURGERY Bilateral    cataracts removed, /w "cyrstal lenses"   . LEFT HEART CATH AND CORONARY ANGIOGRAPHY N/A 05/23/2018   Procedure: LEFT HEART CATH AND CORONARY ANGIOGRAPHY;  Surgeon: Troy Sine, MD;  Location: [facility];  Service: Cardiovascular;  Laterality: N/A;  . SHOULDER ARTHROSCOPY Right    x2   RCR- spurs removed     Family History  Problem Relation Age of Onset  . Heart disease Mother   . Cancer Father   . Heart disease Father   . Arthritis Sister   . Heart failure Sister   . Cancer Brother   . Thyroid cancer Son   . Atrial fibrillation Son   . Arthritis Sister      Social History   Tobacco Use  Smoking Status Never Smoker  Smokeless Tobacco Never Used    Social History   Substance and Sexual Activity  Alcohol Use No      Allergies  Allergen Reactions  . Azithromycin Other (See Comments)    Renal failure  . Prednisone Other (See Comments)    Irritability and insomnia  . Sulfamethoxazole-Trimethoprim Other (See Comments)    Renal failure  . Amlodipine     Caused swelling in legs  . Ciprofloxacin Itching and Rash  . Tape Rash    Current Outpatient Medications  Medication Sig Dispense Refill  . acetaminophen (TYLENOL) 325 MG tablet Take 2 tablets (650 mg total) by mouth every 6 (six) hours as needed. (Patient taking differently: Take 650 mg by mouth every 6 (six) hours as needed for moderate pain or headache. ) 30 tablet 0  . albuterol (PROVENTIL HFA;VENTOLIN HFA) 108 (90 Base) MCG/ACT inhaler Inhale 1-2 puffs into the lungs every 6 (six) hours as needed for wheezing or shortness of breath. 1 Inhaler 1  . carvedilol (COREG) 6.25 MG tablet Take 1 tablet (6.25 mg total) by mouth 2 (two) times daily. 180 tablet 3  . cetirizine (ZYRTEC) 10 MG tablet Take 10 mg by mouth 2 (two) times daily as needed for allergies.    Marland Kitchen clopidogrel (PLAVIX) 75 MG tablet TAKE 1 TABLET BY MOUTH EVERY DAY 90 tablet 0  . DULoxetine (CYMBALTA) 30 MG capsule TAKE 2 CAPSULES (60 MG TOTAL) BY MOUTH DAILY. 180 capsule 0  . famotidine (PEPCID) 20 MG tablet TAKE 1 TABLET (20 MG TOTAL) 2 (TWO) TIMES DAILY AS NEEDED BY MOUTH FOR HEARTBURN OR INDIGESTION. 180 tablet 0  . fluticasone (FLONASE) 50 MCG/ACT nasal spray Place 2 sprays into both nostrils daily. (Patient taking differently: Place 2 sprays into both nostrils daily as needed for allergies. ) 48 g 1  . fluticasone furoate-vilanterol (BREO ELLIPTA) 200-25 MCG/INH AEPB Inhale 1 puff into the lungs daily. (Patient taking differently: Inhale 1 puff into the lungs daily as needed (shortness). ) 30 each 2  . gabapentin (NEURONTIN) 300 MG capsule TAKE 1 CAPSULE BY MOUTH THREE TIMES A DAY (Patient taking differently: Take 300 mg by mouth 2 (two) times daily. ) 270 capsule 1  . glucose blood  test strip Use as instructed 100 each 2  . isosorbide mononitrate (IMDUR) 30 MG 24 hr tablet Take 1 tablet (30 mg total) by mouth daily. 90 tablet 3  . Lancets [facility]  ULTRASOFT) lancets Use as instructed 100 each 12  . latanoprost (XALATAN) 0.005 % ophthalmic solution Place 1 drop into both eyes at bedtime. 2.5 mL 12  . Lidocaine 4 % PTCH Apply 1 patch topically 2 (two) times daily. (Patient taking differently: Apply 1 patch topically 2 (two) times daily as needed (pain). ) 12 patch 0  . LINZESS 290 MCG CAPS capsule TAKE 1 CAPSULE BY MOUTH DAILY BEFORE BREAKFAST. (Patient taking differently: Take 290 mcg by mouth daily. ) 90 capsule 0  . lisinopril (PRINIVIL,ZESTRIL) 20 MG tablet TAKE 2 TABLETS BY MOUTH EVERY DAY (Patient taking differently: Take 20 mg by mouth 2 (two) times daily. ) 180 tablet 1  . montelukast (SINGULAIR) 10 MG tablet TAKE 1 TABLET BY MOUTH EVERYDAY AT BEDTIME 90 tablet 1  . nabumetone (RELAFEN) 500 MG tablet Take 1 tablet (500 mg total) by mouth 2 (two) times daily. For muscle and joint pain 360 tablet 1  . naproxen sodium (ALEVE) 220 MG tablet Take 440 mg by mouth 2 (two) times daily as needed (pain).    . nitroGLYCERIN (NITROSTAT) 0.4 MG SL tablet Place 1 tablet (0.4 mg total) under the tongue every 5 (five) minutes as needed. 25 tablet 3  . ONETOUCH DELICA LANCETS FINE MISC TEST TWICE DAILY 100 each 9  . OXYGEN Inhale 3-4 L into the lungs at bedtime.    . rosuvastatin (CRESTOR) 40 MG tablet Take 1 tablet (40 mg total) by mouth every evening. 90 tablet 1  . tolterodine (DETROL) 1 MG tablet TAKE 1 TABLET BY MOUTH TWICE A DAY 180 tablet 1  . traMADol (ULTRAM) 50 MG tablet TAKE 1 TABLET EVERY 12 HOURS AS NEEDED FOR MODERATE OR SEVERE PAIN (Patient taking differently: Take 50 mg by mouth 2 (two) times daily. ) 60 tablet 2  . traZODone (DESYREL) 50 MG tablet TAKE 1 TABLET (50 MG TOTAL) BY MOUTH NIGHTLY AS NEEDED FOR FOR SLEEP. (Patient taking differently: Take 50 mg by mouth at  bedtime. ) 90 tablet 1  . Triamcinolone Acetonide (TRIAMCINOLONE 0.1 % CREAM : EUCERIN) CREA Apply 1 application topically 2 (two) times daily. 1 each 1  . triamcinolone cream (KENALOG) 0.1 % APPLY TOPICALLY TWO TIMES DAILY (Patient taking differently: Apply 1 application topically 2 (two) times daily as needed (rash). ) 80 g 0  . levofloxacin (LEVAQUIN) 500 MG tablet Take 1 tablet (500 mg total) by mouth daily for 7 days. 7 tablet 0   No current facility-administered medications for this visit.     Pertinent items are noted in HPI.   Review of Systems:     Cardiac Review of Systems: [Y] = yes  or   [ N ] = no   Chest Pain [ y   ]  Resting SOB Blue.Reese   ] Exertional SOB  Blue.Reese ]  Orthopnea Florencio.Farrier ]   Pedal Edema [ y  ]    Palpitations Florencio.Farrier ] Syncope  [ n ]   Presyncope [ n ]   General Review of Systems: [Y] = yes [  ]=no Constitional: recent weight change [  ];  Wt loss over the last 3 months [   ] anorexia [  ]; fatigue [  ]; nausea [  ]; night sweats [  ]; fever [  ]; or chills [  ];           Eye : blurred vision [  ]; diplopia [   ]; vision changes [  ];  Amaurosis  fugax[  ]; Resp: cough [  ];  wheezing[  ];  hemoptysis[  ]; shortness of breath[  ]; paroxysmal nocturnal dyspnea[  ]; dyspnea on exertion[  ]; or orthopnea[  ];  GI:  gallstones[  ], vomiting[  ];  dysphagia[  ]; melena[  ];  hematochezia [  ]; heartburn[  ];   Hx of  Colonoscopy[  ]; GU: kidney stones [  ]; hematuria[  ];   dysuria [  ];  nocturia[  ];  history of     obstruction [  ]; urinary frequency [  ]             Skin: rash, swelling[  ];, hair loss[  ];  peripheral edema[  ];  or itching[  ]; Musculosketetal: myalgias[ y];  joint swelling[  ];  joint erythema[  ];  joint pain[  ];  back pain[  ];  Heme/Lymph: bruising[  ];  bleeding[  ];  anemia[  ];  Neuro: TIA[  ];  headaches[  ];  stroke[y  ];  vertigo[  ];  seizures[  ];   paresthesias[ y ];  difficulty walking[ y ];  Psych:depression[  ]; anxiety[  ];  Endocrine:  diabetes[y  ];  thyroid dysfunction[  ];  Immunizations: Flu up to date [y]; Pneumococcal up to date Blue.Reese  ];  Other:     PHYSICAL EXAMINATION: BP (!) 145/85   Pulse 63   Resp 18   Ht 5\' 1"  (1.549 m)   Wt 200 lb (90.7 kg)   SpO2 95% Comment: RA  BMI 37.79 kg/m  General appearance: alert and Patient has difficulty with standing ambulating in the exam room, and difficulty articulating speech though does seem to answer questions appropriately Head: Normocephalic, without obvious abnormality, atraumatic Neck: no adenopathy, no carotid bruit, no JVD, supple, symmetrical, trachea midline and thyroid not enlarged, symmetric, no tenderness/mass/nodules Lymph nodes: Cervical, supraclavicular, and axillary nodes normal. Resp: clear to auscultation bilaterally Back: symmetric, no curvature. ROM normal. No CVA tenderness. Cardio: regular rate and rhythm, S1, S2 normal, no murmur, click, rub or gallop GI: soft, non-tender; bowel sounds normal; no masses,  no organomegaly Extremities: extremities normal, atraumatic, no cyanosis or edema and Homans sign is negative, no sign of DVT Neurologic: As noted the patient's had 2 strokes in the past which left her weak difficulty with ambulation and balance, she also has difficulty with speech  Diagnostic Studies & Laboratory data:     Recent Radiology Findings:    CLINICAL DATA:  78 year old female with hypertension, hyperlipidemia and chest pain.  EXAM: Cardiac/Coronary  CT  TECHNIQUE: The patient was scanned on a [employer].  FINDINGS: A 120 kV prospective scan was triggered in the descending thoracic aorta at 111 HU's. Axial non-contrast 3 mm slices were carried out through the heart. The data set was analyzed on a dedicated work station and scored using the [place]. Gantry rotation speed was 250 msecs and collimation was .6 mm. 10 mg of iv Metoprolol and 0.8 mg of sl NTG was given. The 3D data set was reconstructed in  5% intervals of the 67-82 % of the R-R cycle. Diastolic phases were analyzed on a dedicated work station using MPR, MIP and VRT modes. The patient received 80 cc of contrast.  Aorta:  Normal size.  Mild calcifications.  No dissection.  Aortic Valve:  Trileaflet.  Mild calcifications.  Coronary Arteries:  Normal coronary origin.  Right dominance.  RCA is a  dominant artery with relatively small lumen and severe diffuse plaque with > 70% stenosis in the proximal and mid RCA. PDA and PLA have small lumen and diffuse plaque.  Left main is a large artery that gives rise to LAD, ramus intermedius and LCX arteries. LM has mild calcified plaque in the distal portion with associated stenosis 25-50%.  LAD is a large vessel that has severe diffuse mixed plaque with suspicion for >70% stenosis in the proximal and mid segment.  Ramus intermedius has a moderate diffuse plaque.  LCX is a non-dominant artery that gives rise to one small OM1 branch. There is mild diffuse plaque.  Other findings:  Normal pulmonary vein drainage into the left atrium.  Normal let atrial appendage without a thrombus.  Dilated pulmonary artery measuring 32 mm.  IMPRESSION: 1. Coronary calcium score of 486. This was 42 percentile for age and sex matched control.  2. Normal coronary origin with right dominance.  3. Diffuse CAD with severe stenosis in the proximal RCA and proximal and mid LAD. A cardiac catheterization is recommended.  Electronically Signed: By: Ena Dawley On: 01/10/2018 16:52 ADDENDUM REPORT: 01/11/2018 08:26  EXAM: OVER-READ INTERPRETATION  CT CHEST  The following report is an over-read performed by radiologist Dr. Norlene Duel [facility] on 01/11/2018. This over-read does not include interpretation of cardiac or coronary anatomy or pathology. The coronary CTA interpretation by the cardiologist is attached.  COMPARISON:  CT AP  07/07/2012  FINDINGS: No noncardiac mediastinal abnormalities identified.  There is no pleural effusion. Subpleural nodule within the lingula measures 5 mm, image 13/12. Several additional lingular nodules are identified which measure up to 7 mm, image 26/12. Subpleural nodule within the lateral left lung base measures 9 mm, image 31/12. Right lower lobe lateral subpleural nodule measures 6 mm, image 19/2.  No acute findings identified within the limited images through the upper abdomen.  Right rib fracture identified, appears chronic, image 33/11.  IMPRESSION: 1. There are several pulmonary nodules identified within the lingula and both lower lobes. The largest is in the lateral left lung base and measuring 9 mm. Non-contrast chest CT at 3-6 months is recommended. If the nodules are stable at time of repeat CT, then future CT at 18-24 months (from today's scan) is considered optional for low-risk patients, but is recommended for high-risk patients. This recommendation follows the consensus statement: Guidelines for Management of Incidental Pulmonary Nodules Detected on CT Images: From the Fleischner Society 2017; Radiology 2017; 284:228-243.  I have independently reviewed the above radiology studies  and reviewed the findings with the patient.   Recent Lab Findings: Lab Results  Component Value Date   WBC 6.8 05/19/2018   HGB 13.3 05/23/2018   HCT 39.0 05/23/2018   PLT 377 05/19/2018   GLUCOSE 174 (H) 05/23/2018   CHOL 260 (H) 05/19/2018   TRIG 201 (H) 05/19/2018   HDL 43 (L) 05/19/2018   LDLCALC 180 (H) 05/19/2018   ALT 10 03/23/2017   AST 11 03/23/2017   NA 136 05/23/2018   K 4.3 05/23/2018   CL 105 05/19/2018   CREATININE 0.99 (H) 05/19/2018   BUN 25 05/19/2018   CO2 28 05/19/2018   TSH 3.699 03/22/2016   INR 1.03 10/09/2016   HGBA1C 6.0 04/10/2018    CATH: Prox RCA to Mid RCA lesion is 100% stenosed.  Prox RCA lesion is 60% stenosed.  Dist LM  lesion is 60% stenosed.  Ost Cx to Prox Cx lesion is 50% stenosed.  Ost LAD to Prox LAD lesion is 30% stenosed.  Ost 1st Diag lesion is 50% stenosed.  Mid LAD lesion is 70% stenosed.  Prox LAD lesion is 30% stenosed.  Prox Cx to Mid Cx lesion is 20% stenosed.   Multivessel CAD with 60% eccentric distal left main stenosis extending into the ostium of the circumflex vessel; 30% calcified proximal LAD stenosis, 50% ostial diagonal stenosis, 30% mid LAD stenosis followed by 70% mid-distal LAD stenosis; 20% proximal circumflex irregularity and total occlusion of the proximal to mid RCA with 60% very proximal stenosis and evidence for both bridging antegrade collaterals as well as extensive left-to-right retrograde collaterals supplying the distal RCA, PDA and PLA  System.  LVEDP 24 mm Hg.  RECOMMENDATION: by Dr Claiborne Billings A 2D echo Doppler study be performed as an outpatient to assess for LV function.  The patient was significantly hypertensive.  Recommend increase anti-anginal regimen and will initiate nitrates and increased beta-blocker therapy.  There is reported allergy to amlodipine.  Recommend imminent follow-up with Dr. Bronson Ing.  Consider surgical consultation for potential CABG revascularization versus aggressive medical therapy trial.  ECHO: Transthoracic Echocardiography  Patient:    Nurah, Petrides MR #:       [id number] Study Date: 05/30/2018 Gender:     F Age:        55 Height:     154.9 cm Weight:     83 kg BSA:        1.93 m^2 Pt. Status: Room:   [place], MD  [place], MD  [place], MD  PERFORMING   [facility], Forestine Na  SONOGRAPHER  Alvino Chapel, RCS  cc:  ------------------------------------------------------------------- LV EF: 50% -   55%  ------------------------------------------------------------------- Indications:      CAD of native vessels  414.01.  ------------------------------------------------------------------- History:   PMH:  Acquired from the patient and from the patient&'s chart.  Chronic obstructive pulmonary disease.  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  Risk factors: Hypertension. Diabetes mellitus. Dyslipidemia.  ------------------------------------------------------------------- Study Conclusions  - Left ventricle: The cavity size was normal. Moderate focal basal   septal hypertrophy and otherwise mild hypertrophy of remaining   myocardium. Systolic function was at the lower limits of normal.   The estimated ejection fraction was in the range of 50% to 55%.   Doppler parameters are consistent with abnormal left ventricular   relaxation (grade 1 diastolic dysfunction). Doppler parameters   are consistent with high ventricular filling pressure. - Regional wall motion abnormality: Hypokinesis of the entire   inferior and mid inferolateral myocardium; mild hypokinesis of   the apical lateral myocardium. - Aortic valve: Mildly calcified annulus. Trileaflet. - Mitral valve: Mildly calcified annulus. Mildly thickened leaflets   . - Left atrium: The atrium was mildly to moderately dilated. - Pulmonic valve: There was mild regurgitation.  ------------------------------------------------------------------- Study data:  Comparison was made to the study of 10/10/2016.  Study status:  Routine.  Procedure:  The patient reported no pain pre or post test. Transthoracic echocardiography. Image quality was adequate.  Study completion:  There were no complications. Transthoracic echocardiography.  M-mode, complete 2D, spectral Doppler, and color Doppler.  Birthdate:  Patient birthdate: 12-14-1940.  Age:  Patient is 78 yr old.  Sex:  Gender: female. BMI: 34.6 kg/m^2.  Blood pressure:     164/81  Patient status: Inpatient.  Study date:  Study date: 05/30/2018. Study  time: 03:17 PM.   Location:  Echo laboratory.  -------------------------------------------------------------------  ------------------------------------------------------------------- Left ventricle:  The cavity size was normal. Moderate focal basal septal hypertrophy and otherwise mild hypertrophy of remaining myocardium. Systolic function was at the lower limits of normal. The estimated ejection fraction was in the range of 50% to 55%. Regional wall motion abnormalities:  Hypokinesis of the entire inferior and mid inferolateral myocardium; mild hypokinesis of the apical lateral myocardium. Doppler parameters are consistent with abnormal left ventricular relaxation (grade 1 diastolic dysfunction). Doppler parameters are consistent with high ventricular filling pressure.  ------------------------------------------------------------------- Aortic valve:   Mildly calcified annulus. Trileaflet.  Doppler: There was no stenosis.   There was no significant regurgitation.   ------------------------------------------------------------------- Aorta:  Aortic root: The aortic root was normal in size.  ------------------------------------------------------------------- Mitral valve:   Mildly calcified annulus. Mildly thickened leaflets .  Doppler:  There was trivial regurgitation.  ------------------------------------------------------------------- Left atrium:  The atrium was mildly to moderately dilated.  ------------------------------------------------------------------- Atrial septum:  No defect or patent foramen ovale was identified.   ------------------------------------------------------------------- Right ventricle:  The cavity size was normal. Systolic function was low normal.  ------------------------------------------------------------------- Pulmonic valve:    The valve appears to be grossly normal. Doppler:  There was mild  regurgitation.  ------------------------------------------------------------------- Tricuspid valve:   The valve appears to be grossly normal. Doppler:  There was trivial regurgitation.  ------------------------------------------------------------------- Right atrium:  The atrium was normal in size.  ------------------------------------------------------------------- Pericardium:  There was no pericardial effusion.  ------------------------------------------------------------------- Systemic veins: Inferior vena cava: The vessel was normal in size. The respirophasic diameter changes were in the normal range (>= 50%), consistent with normal central venous pressure.  ------------------------------------------------------------------- Measurements   Left ventricle                           Value        Reference  LV ID, ED, PLAX chordal                  48.1  mm     43 - 52  LV ID, ES, PLAX chordal                  30.1  mm     23 - 38  LV fx shortening, PLAX chordal           37    %      >=29  LV PW thickness, ED                      11.1  mm     ----------  IVS/LV PW ratio, ED                      1.23         <=1.3  Stroke volume, 2D                        55    ml     ----------  Stroke volume/bsa, 2D                    29    ml/m^2 ----------  LV ejection fraction, 1-p A4C            52    %      ----------  LV end-diastolic volume, 2-p  66    ml     ----------  LV end-systolic volume, 2-p              31    ml     ----------  LV ejection fraction, 2-p                54    %      ----------  Stroke volume, 2-p                       36    ml     ----------  LV end-diastolic volume/bsa, 2-p         34    ml/m^2 ----------  LV end-systolic volume/bsa, 2-p          16    ml/m^2 ----------  Stroke volume/bsa, 2-p                   18.6  ml/m^2 ----------  LV e&', lateral                           5.55  cm/s   ----------  LV E/e&', lateral                          10.16        ----------  LV e&', medial                            3.15  cm/s   ----------  LV E/e&', medial                          17.9         ----------  LV e&', average                           4.35  cm/s   ----------  LV E/e&', average                         12.97        ----------    Ventricular septum                       Value        Reference  IVS thickness, ED                        13.7  mm     ----------    LVOT                                     Value        Reference  LVOT ID, S                               19    mm     ----------  LVOT area                                2.84  cm^2   ----------  LVOT peak  velocity, S                    85.3  cm/s   ----------  LVOT mean velocity, S                    66.6  cm/s   ----------  LVOT VTI, S                              19.2  cm     ----------    Aorta                                    Value        Reference  Aortic root ID, ED                       30    mm     ----------    Left atrium                              Value        Reference  LA ID, A-P, ES                           32    mm     ----------  LA ID/bsa, A-P                           1.66  cm/m^2 <=2.2  LA volume, S                             61.7  ml     ----------  LA volume/bsa, S                         32    ml/m^2 ----------  LA volume, ES, 1-p A4C                   76.8  ml     ----------  LA volume/bsa, ES, 1-p A4C               39.8  ml/m^2 ----------  LA volume, ES, 1-p A2C                   47.8  ml     ----------  LA volume/bsa, ES, 1-p A2C               24.8  ml/m^2 ----------    Mitral valve                             Value        Reference  Mitral E-wave peak velocity              56.4  cm/s   ----------  Mitral A-wave peak velocity              88.3  cm/s   ----------  Mitral deceleration time         (H)     239   ms  150 - 230  Mitral E/A ratio, peak                   0.6          ----------    Right atrium                              Value        Reference  RA ID, S-I, ES, A4C                      46.7  mm     34 - 49  RA area, ES, A4C                         13.2  cm^2   8.3 - 19.5  RA volume, ES, A/L                       31.7  ml     ----------  RA volume/bsa, ES, A/L                   16.4  ml/m^2 ----------    Systemic veins                           Value        Reference  Estimated CVP                            3     mm Hg  ----------    Right ventricle                          Value        Reference  RV ID, ED, PLAX                          23.6  mm     19 - 38  TAPSE                                    19.9  mm     ----------  RV s&', lateral, S                        10.7  cm/s   ----------  Legend: (L)  and  (H)  mark values outside specified reference range.  ------------------------------------------------------------------- Prepared and Electronically Authenticated by  Kate Sable, MD 2020-01-14T16:22:57  Assessment / Plan:   #1 coronary occlusive disease with minimal symptoms, because of the patient's underlying medical condition previous stroke she would be an extremely poor candidate for coronary artery bypass grafting.  I discussed this with the patient and her husband.  Before the patient came to her appointment she had already decided she was not having any major surgery. #2 multiple 6 and 7 mm pulmonary nodules noted on her cardiac CT-be up to the discretion of her primary care doctor and cardiology to follow this. #3 underlying pulmonary disease on home oxygen at night #4 history of debilitating stroke x2      I  spent 60 minutes with  the patient face to  face and greater then 50% of the time was spent in counseling and coordination of care.    Grace Isaac MD      [address] [place] [address] Office [phone]   Beeper [phone]  06/04/2018 6:37 PM

## 2018-06-07 ENCOUNTER — Encounter: Payer: Self-pay | Admitting: Pediatrics

## 2018-06-07 DIAGNOSIS — R918 Other nonspecific abnormal finding of lung field: Secondary | ICD-10-CM | POA: Insufficient documentation

## 2018-06-08 ENCOUNTER — Other Ambulatory Visit: Payer: Self-pay | Admitting: Pediatrics

## 2018-06-08 ENCOUNTER — Other Ambulatory Visit: Payer: Self-pay | Admitting: Family Medicine

## 2018-06-08 DIAGNOSIS — K5900 Constipation, unspecified: Secondary | ICD-10-CM

## 2018-06-08 DIAGNOSIS — J441 Chronic obstructive pulmonary disease with (acute) exacerbation: Secondary | ICD-10-CM

## 2018-06-09 ENCOUNTER — Encounter: Payer: Self-pay | Admitting: Nurse Practitioner

## 2018-06-09 ENCOUNTER — Ambulatory Visit (INDEPENDENT_AMBULATORY_CARE_PROVIDER_SITE_OTHER): Payer: Medicare HMO | Admitting: Nurse Practitioner

## 2018-06-09 ENCOUNTER — Telehealth: Payer: Self-pay | Admitting: Pediatrics

## 2018-06-09 VITALS — BP 167/83 | HR 67 | Temp 96.9°F | Ht 61.0 in | Wt 201.0 lb

## 2018-06-09 DIAGNOSIS — J441 Chronic obstructive pulmonary disease with (acute) exacerbation: Secondary | ICD-10-CM

## 2018-06-09 MED ORDER — PREDNISONE 20 MG PO TABS: ORAL_TABLET | ORAL | 0 refills | Status: DC

## 2018-06-09 MED ORDER — HYDROCODONE-HOMATROPINE 5-1.5 MG/5ML PO SYRP: 5.0000 mL | ORAL_SOLUTION | Freq: Four times a day (QID) | ORAL | 0 refills | Status: DC | PRN

## 2018-06-09 NOTE — Telephone Encounter (Signed)
What symptoms do you have? Bronchitis still not better  How long have you been sick? 06/02/2018  Have you been seen for this problem? 06/02/2018 pt finish rx two days ago  If your provider decides to give you a prescription, which pharmacy would you like for it to be sent to? [facility]   Patient informed that this information will be sent to the clinical staff for review and that they should receive a follow up call.

## 2018-06-09 NOTE — Progress Notes (Signed)
   Subjective:    Patient ID: Susan Patel, female    DOB: March 09, 1941, 78 y.o.   MRN: [id number]   Chief Complaint: Cough (seen 1/17- no better) and Nasal Congestion   HPI Patient comes in stating that she is no better. Cough has worsened. She was seen by rakes,fnp on 06/02/18, she was given depomedrol  Injection and was started on levaquin. She is still coughing and sneezing. She has a history of COPD.    Review of Systems  Respiratory: Positive for cough. Negative for shortness of breath.   Cardiovascular: Negative.   Neurological: Negative.   All other systems reviewed and are negative.      Objective:   Physical Exam Constitutional:      General: She is not in acute distress.    Appearance: She is obese.  HENT:     Right Ear: Tympanic membrane and ear canal normal.     Left Ear: Tympanic membrane and ear canal normal.     Mouth/Throat:     Mouth: Mucous membranes are moist.  Eyes:     Pupils: Pupils are equal, round, and reactive to light.  Neck:     Musculoskeletal: Normal range of motion.  Cardiovascular:     Rate and Rhythm: Normal rate and regular rhythm.     Pulses: Normal pulses.     Heart sounds: Normal heart sounds.  Skin:    General: Skin is warm and dry.  Neurological:     General: No focal deficit present.     Mental Status: She is alert.  Psychiatric:        Mood and Affect: Mood normal.        Behavior: Behavior normal.    BP (!) 167/83 (BP Location: Left Arm)   Pulse 67   Temp (!) 96.9 F (36.1 C) (Oral)   Ht 5\' 1"  (1.549 m)   Wt 201 lb (91.2 kg)   BMI 37.98 kg/m        Assessment & Plan:  Susan Patel in today with chief complaint of Cough (seen 1/17- no better) and Nasal Congestion   1. COPD with acute exacerbation ([place]) ru humidifier Finish antibiotic as rx RTO prn - predniSONE (DELTASONE) 20 MG tablet; 2 po at sametime daily for 5 days  Dispense: 10 tablet; Refill: 0 - HYDROcodone-homatropine (HYCODAN) 5-1.5 MG/5ML syrup;  Take 5 mLs by mouth every 6 (six) hours as needed for cough.  Dispense: 120 mL; Refill: 0  Mary-Margaret Hassell Done, FNP

## 2018-06-09 NOTE — Telephone Encounter (Signed)
If she is still symptomatic, she needs to be reevaluated.

## 2018-06-09 NOTE — Telephone Encounter (Signed)
Pt scheduled in after hours clinic at 5:45 this afternoon.

## 2018-06-15 DIAGNOSIS — E1165 Type 2 diabetes mellitus with hyperglycemia: Secondary | ICD-10-CM | POA: Diagnosis not present

## 2018-06-15 DIAGNOSIS — R0902 Hypoxemia: Secondary | ICD-10-CM | POA: Diagnosis not present

## 2018-06-20 ENCOUNTER — Ambulatory Visit (INDEPENDENT_AMBULATORY_CARE_PROVIDER_SITE_OTHER): Payer: Medicare HMO | Admitting: Family Medicine

## 2018-06-20 ENCOUNTER — Other Ambulatory Visit: Payer: Self-pay | Admitting: Pediatrics

## 2018-06-20 ENCOUNTER — Encounter: Payer: Self-pay | Admitting: Family Medicine

## 2018-06-20 VITALS — BP 131/69 | HR 83 | Temp 96.8°F | Ht 61.0 in | Wt 205.8 lb

## 2018-06-20 DIAGNOSIS — J441 Chronic obstructive pulmonary disease with (acute) exacerbation: Secondary | ICD-10-CM

## 2018-06-20 MED ORDER — AMOXICILLIN-POT CLAVULANATE 875-125 MG PO TABS: 1.0000 | ORAL_TABLET | Freq: Two times a day (BID) | ORAL | 0 refills | Status: DC

## 2018-06-20 MED ORDER — PREDNISONE 20 MG PO TABS: ORAL_TABLET | ORAL | 0 refills | Status: DC

## 2018-06-20 NOTE — Progress Notes (Signed)
BP 131/69   Pulse 83   Temp (!) 96.8 F (36 C) (Oral)   Ht 5\' 1"  (1.549 m)   Wt 205 lb 12.8 oz (93.4 kg)   SpO2 95%   BMI 38.89 kg/m    Subjective:    Patient ID: Susan Patel, female    DOB: 12/07/1940, 78 y.o.   MRN: [id number]  HPI: [name] is a 78 y.o. female presenting on 06/20/2018 for COPD (Patient was seen 1/24 and states she is still sick- sneezing, SOB, cough)   HPI Cough and congestion and wheezing Patient has been having continued cough and congestion and wheezing per her husband and her that has been going on.  She was seen on 06/09/2018 and once right before that for this as well and she has been treated with Levaquin and Depo-Medrol initially and then the second time was treated with Hycodan cough syrup and prednisone dose, he says that the second course of the prednisone did improve things but then it came right back as soon as she finished it.  He does not know for sure if she has been using her Brio Ellipta consistently or not.  She says she does feel short of breath at times and that it comes and goes throughout the day but is not necessarily worse during the day or at night.  She denies any fevers or chills or sinus pressure congestion but mainly has the chest congestion and the wheezing and coughing spells.  The cough is been mostly dry and nonproductive at this point.  Relevant past medical, surgical, family and social history reviewed and updated as indicated. Interim medical history since our last visit reviewed. Allergies and medications reviewed and updated.  Review of Systems  Constitutional: Negative for chills and fever.  HENT: Positive for congestion and sneezing. Negative for ear discharge, ear pain, postnasal drip, rhinorrhea, sinus pressure and sore throat.   Eyes: Negative for pain, redness and visual disturbance.  Respiratory: Positive for cough, shortness of breath and wheezing. Negative for chest tightness.   Cardiovascular: Negative for  chest pain and leg swelling.  Genitourinary: Negative for difficulty urinating and dysuria.  Musculoskeletal: Negative for back pain and gait problem.  Skin: Negative for rash.  Neurological: Negative for light-headedness and headaches.  Psychiatric/Behavioral: Negative for agitation and behavioral problems.  All other systems reviewed and are negative.   Per HPI unless specifically indicated above   Allergies as of 06/20/2018      Reactions   Azithromycin Other (See Comments)   Renal failure   Prednisone Other (See Comments)   Irritability and insomnia   Sulfamethoxazole-trimethoprim Other (See Comments)   Renal failure   Amlodipine    Caused swelling in legs   Ciprofloxacin Itching, Rash   Tape Rash      Medication List       Accurate as of June 20, 2018 10:09 AM. Always use your most recent med list.        acetaminophen 325 MG tablet Commonly known as:  TYLENOL Take 2 tablets (650 mg total) by mouth every 6 (six) hours as needed.   albuterol 108 (90 Base) MCG/ACT inhaler Commonly known as:  PROVENTIL HFA;VENTOLIN HFA Inhale 1-2 puffs into the lungs every 6 (six) hours as needed for wheezing or shortness of breath.   amoxicillin-clavulanate 875-125 MG tablet Commonly known as:  AUGMENTIN Take 1 tablet by mouth 2 (two) times daily.   carvedilol 6.25 MG tablet Commonly known as:  COREG  Take 1 tablet (6.25 mg total) by mouth 2 (two) times daily.   cetirizine 10 MG tablet Commonly known as:  ZYRTEC Take 10 mg by mouth 2 (two) times daily as needed for allergies.   clopidogrel 75 MG tablet Commonly known as:  PLAVIX TAKE 1 TABLET BY MOUTH EVERY DAY   DULoxetine 30 MG capsule Commonly known as:  CYMBALTA TAKE 2 CAPSULES (60 MG TOTAL) BY MOUTH DAILY.   famotidine 20 MG tablet Commonly known as:  PEPCID TAKE 1 TABLET (20 MG TOTAL) 2 (TWO) TIMES DAILY AS NEEDED BY MOUTH FOR HEARTBURN OR INDIGESTION.   fluticasone 50 MCG/ACT nasal spray Commonly known as:   FLONASE Place 2 sprays into both nostrils daily.   fluticasone furoate-vilanterol 200-25 MCG/INH Aepb Commonly known as:  BREO ELLIPTA Inhale 1 puff into the lungs daily.   gabapentin 300 MG capsule Commonly known as:  NEURONTIN TAKE 1 CAPSULE BY MOUTH THREE TIMES A DAY   glimepiride 2 MG tablet Commonly known as:  AMARYL TAKE 1 TABLET (2 MG TOTAL) BY MOUTH DAILY BEFORE BREAKFAST.   glucose blood test strip Use as instructed   HYDROcodone-homatropine 5-1.5 MG/5ML syrup Commonly known as:  HYCODAN Take 5 mLs by mouth every 6 (six) hours as needed for cough.   isosorbide mononitrate 30 MG 24 hr tablet Commonly known as:  IMDUR Take 1 tablet (30 mg total) by mouth daily.   latanoprost 0.005 % ophthalmic solution Commonly known as:  XALATAN Place 1 drop into both eyes at bedtime.   Lidocaine 4 % Ptch Apply 1 patch topically 2 (two) times daily.   LINZESS 290 MCG Caps capsule Generic drug:  linaclotide TAKE 1 CAPSULE BY MOUTH DAILY BEFORE BREAKFAST.   lisinopril 20 MG tablet Commonly known as:  PRINIVIL,ZESTRIL TAKE 2 TABLETS BY MOUTH EVERY DAY   montelukast 10 MG tablet Commonly known as:  SINGULAIR TAKE 1 TABLET BY MOUTH EVERYDAY AT BEDTIME   nabumetone 500 MG tablet Commonly known as:  RELAFEN Take 1 tablet (500 mg total) by mouth 2 (two) times daily. For muscle and joint pain   naproxen sodium 220 MG tablet Commonly known as:  ALEVE Take 440 mg by mouth 2 (two) times daily as needed (pain).   nitroGLYCERIN 0.4 MG SL tablet Commonly known as:  NITROSTAT Place 1 tablet (0.4 mg total) under the tongue every 5 (five) minutes as needed.   onetouch ultrasoft lancets Use as instructed   ONETOUCH DELICA LANCETS FINE Misc TEST TWICE DAILY   OXYGEN Inhale 3-4 L into the lungs at bedtime.   predniSONE 20 MG tablet Commonly known as:  DELTASONE Take 3 tabs daily for 1 week, then 2 tabs daily for week 2, then 1 tab daily for week 3.   rosuvastatin 40 MG  tablet Commonly known as:  CRESTOR Take 1 tablet (40 mg total) by mouth every evening.   tolterodine 1 MG tablet Commonly known as:  DETROL TAKE 1 TABLET BY MOUTH TWICE A DAY   traMADol 50 MG tablet Commonly known as:  ULTRAM TAKE 1 TABLET EVERY 12 HOURS AS NEEDED FOR MODERATE OR SEVERE PAIN   traZODone 50 MG tablet Commonly known as:  DESYREL TAKE 1 TABLET (50 MG TOTAL) BY MOUTH NIGHTLY AS NEEDED FOR FOR SLEEP.   triamcinolone 0.1 % cream : eucerin Crea Apply 1 application topically 2 (two) times daily.   triamcinolone cream 0.1 % Commonly known as:  KENALOG APPLY TOPICALLY TWO TIMES DAILY  Objective:    BP 131/69   Pulse 83   Temp (!) 96.8 F (36 C) (Oral)   Ht 5\' 1"  (1.549 m)   Wt 205 lb 12.8 oz (93.4 kg)   SpO2 95%   BMI 38.89 kg/m   Wt Readings from Last 3 Encounters:  06/20/18 205 lb 12.8 oz (93.4 kg)  06/09/18 201 lb (91.2 kg)  06/02/18 203 lb (92.1 kg)    Physical Exam Vitals signs reviewed.  Constitutional:      General: She is not in acute distress.    Appearance: She is well-developed. She is not diaphoretic.  HENT:     Right Ear: Tympanic membrane, ear canal and external ear normal.     Left Ear: Tympanic membrane, ear canal and external ear normal.     Nose: Mucosal edema present. No rhinorrhea.     Right Sinus: No maxillary sinus tenderness or frontal sinus tenderness.     Left Sinus: No maxillary sinus tenderness or frontal sinus tenderness.     Mouth/Throat:     Pharynx: Uvula midline. No oropharyngeal exudate or posterior oropharyngeal erythema.     Tonsils: No tonsillar abscesses.  Eyes:     Conjunctiva/sclera: Conjunctivae normal.  Cardiovascular:     Rate and Rhythm: Normal rate and regular rhythm.     Heart sounds: Normal heart sounds. No murmur.  Pulmonary:     Effort: Pulmonary effort is normal. No respiratory distress.     Breath sounds: Normal breath sounds. No wheezing, rhonchi or rales.  Musculoskeletal: Normal range  of motion.        General: No tenderness.  Skin:    General: Skin is warm and dry.     Findings: No rash.  Neurological:     Mental Status: She is alert and oriented to person, place, and time.     Coordination: Coordination normal.  Psychiatric:        Behavior: Behavior normal.         Assessment & Plan:   Problem List Items Addressed This Visit    None    Visit Diagnoses    COPD exacerbation ([place])    -  Primary   Relevant Medications   amoxicillin-clavulanate (AUGMENTIN) 875-125 MG tablet   predniSONE (DELTASONE) 20 MG tablet      Sent Augmentin for the patient and a longer course of prednisone, she has taken before but just does not like being jittery that twice on her allergy list.  Also recommended for her to make sure she is taking her Brio Ellipta every day but it sounds like she is not necessarily doing right now. Follow up plan: Return if symptoms worsen or fail to improve.  Counseling provided for all of the vaccine components No orders of the defined types were placed in this encounter.   Caryl Pina, MD [facility] 06/20/2018, 10:09 AM

## 2018-06-22 ENCOUNTER — Telehealth: Payer: Self-pay | Admitting: Family Medicine

## 2018-06-22 NOTE — Telephone Encounter (Signed)
Patient aware.

## 2018-06-22 NOTE — Telephone Encounter (Signed)
She needs a follow up appointment to discuss lung nodules found and to have a follow up CT ordered.

## 2018-06-23 ENCOUNTER — Ambulatory Visit (INDEPENDENT_AMBULATORY_CARE_PROVIDER_SITE_OTHER): Payer: Medicare HMO | Admitting: Cardiovascular Disease

## 2018-06-23 ENCOUNTER — Encounter: Payer: Self-pay | Admitting: Cardiovascular Disease

## 2018-06-23 VITALS — BP 140/64 | HR 71 | Ht 65.0 in | Wt 208.0 lb

## 2018-06-23 DIAGNOSIS — I25118 Atherosclerotic heart disease of native coronary artery with other forms of angina pectoris: Secondary | ICD-10-CM | POA: Diagnosis not present

## 2018-06-23 DIAGNOSIS — Z8673 Personal history of transient ischemic attack (TIA), and cerebral infarction without residual deficits: Secondary | ICD-10-CM | POA: Diagnosis not present

## 2018-06-23 DIAGNOSIS — I10 Essential (primary) hypertension: Secondary | ICD-10-CM | POA: Diagnosis not present

## 2018-06-23 DIAGNOSIS — E785 Hyperlipidemia, unspecified: Secondary | ICD-10-CM

## 2018-06-23 MED ORDER — ROSUVASTATIN CALCIUM 40 MG PO TABS: 40.0000 mg | ORAL_TABLET | Freq: Every evening | ORAL | 3 refills | Status: DC

## 2018-06-23 NOTE — Progress Notes (Signed)
SUBJECTIVE: The patient presents for routine follow-up.  In early January I increased carvedilol to 6.25 mg twice daily and started Imdur 30 mg daily.  I recommended starting Repatha as LDL was thought to be markedly elevated on Crestor 40 mg.  I also ordered an echocardiogram.  Cardiac catheterization was reviewed with results detailed below.  I also made a referral to CT surgery to see if she is a candidate for CABG.  Echocardiogram demonstrated normal left ventricular systolic function, LVEF 50 to 51%, grade 1 diastolic dysfunction, and multiple regional wall motion abnormalities.  She saw CT surgery on 06/02/2018.  It was felt that due to the patient's underlying medical conditions and previous stroke she would be an extremely poor candidate for CABG.  She is here with her husband.  She denies chest pain.  She has had some acute bronchitis recently and has some shortness of breath related to that.  She denies orthopnea, leg swelling, and paroxysmal nocturnal dyspnea.  Interestingly, she has not been taking Crestor as per her husband.  It has been listed on her medication list for several months and PCP notes from June 2019 indicate that she has been taking it regularly.  The patient's husband denies this.      Review of Systems: As per "subjective", otherwise negative.  Allergies  Allergen Reactions  . Azithromycin Other (See Comments)    Renal failure  . Prednisone Other (See Comments)    Irritability and insomnia  . Sulfamethoxazole-Trimethoprim Other (See Comments)    Renal failure  . Amlodipine     Caused swelling in legs  . Ciprofloxacin Itching and Rash  . Tape Rash    Current Outpatient Medications  Medication Sig Dispense Refill  . acetaminophen (TYLENOL) 325 MG tablet Take 2 tablets (650 mg total) by mouth every 6 (six) hours as needed. (Patient taking differently: Take 650 mg by mouth every 6 (six) hours as needed for moderate pain or headache. ) 30 tablet  0  . albuterol (PROVENTIL HFA;VENTOLIN HFA) 108 (90 Base) MCG/ACT inhaler Inhale 1-2 puffs into the lungs every 6 (six) hours as needed for wheezing or shortness of breath. 1 Inhaler 1  . amoxicillin-clavulanate (AUGMENTIN) 875-125 MG tablet Take 1 tablet by mouth 2 (two) times daily. 20 tablet 0  . carvedilol (COREG) 6.25 MG tablet Take 1 tablet (6.25 mg total) by mouth 2 (two) times daily. 180 tablet 3  . cetirizine (ZYRTEC) 10 MG tablet Take 10 mg by mouth 2 (two) times daily as needed for allergies.    Marland Kitchen clopidogrel (PLAVIX) 75 MG tablet TAKE 1 TABLET BY MOUTH EVERY DAY 90 tablet 0  . DULoxetine (CYMBALTA) 30 MG capsule TAKE 2 CAPSULES (60 MG TOTAL) BY MOUTH DAILY. 180 capsule 0  . famotidine (PEPCID) 20 MG tablet TAKE 1 TABLET (20 MG TOTAL) 2 (TWO) TIMES DAILY AS NEEDED BY MOUTH FOR HEARTBURN OR INDIGESTION. 180 tablet 0  . fluticasone (FLONASE) 50 MCG/ACT nasal spray Place 2 sprays into both nostrils daily. (Patient taking differently: Place 2 sprays into both nostrils daily as needed for allergies. ) 48 g 1  . fluticasone furoate-vilanterol (BREO ELLIPTA) 200-25 MCG/INH AEPB Inhale 1 puff into the lungs daily. (Patient taking differently: Inhale 1 puff into the lungs daily as needed (shortness). ) 30 each 2  . gabapentin (NEURONTIN) 300 MG capsule TAKE 1 CAPSULE BY MOUTH THREE TIMES A DAY (Patient taking differently: Take 300 mg by mouth 2 (two) times daily. ) 270 capsule  1  . glimepiride (AMARYL) 2 MG tablet TAKE 1 TABLET (2 MG TOTAL) BY MOUTH DAILY BEFORE BREAKFAST. 90 tablet 0  . glucose blood test strip Use as instructed 100 each 2  . HYDROcodone-homatropine (HYCODAN) 5-1.5 MG/5ML syrup Take 5 mLs by mouth every 6 (six) hours as needed for cough. 120 mL 0  . isosorbide mononitrate (IMDUR) 30 MG 24 hr tablet Take 1 tablet (30 mg total) by mouth daily. 90 tablet 3  . Lancets (ONETOUCH ULTRASOFT) lancets Use as instructed 100 each 12  . latanoprost (XALATAN) 0.005 % ophthalmic solution Place  1 drop into both eyes at bedtime. 2.5 mL 12  . Lidocaine 4 % PTCH Apply 1 patch topically 2 (two) times daily. (Patient taking differently: Apply 1 patch topically 2 (two) times daily as needed (pain). ) 12 patch 0  . LINZESS 290 MCG CAPS capsule TAKE 1 CAPSULE BY MOUTH DAILY BEFORE BREAKFAST. 90 capsule 1  . lisinopril (PRINIVIL,ZESTRIL) 20 MG tablet TAKE 2 TABLETS BY MOUTH EVERY DAY (Patient taking differently: Take 20 mg by mouth 2 (two) times daily. ) 180 tablet 1  . montelukast (SINGULAIR) 10 MG tablet TAKE 1 TABLET BY MOUTH EVERYDAY AT BEDTIME 90 tablet 1  . nabumetone (RELAFEN) 500 MG tablet Take 1 tablet (500 mg total) by mouth 2 (two) times daily. For muscle and joint pain 360 tablet 1  . naproxen sodium (ALEVE) 220 MG tablet Take 440 mg by mouth 2 (two) times daily as needed (pain).    . nitroGLYCERIN (NITROSTAT) 0.4 MG SL tablet Place 1 tablet (0.4 mg total) under the tongue every 5 (five) minutes as needed. 25 tablet 3  . ONETOUCH DELICA LANCETS FINE MISC TEST TWICE DAILY 100 each 9  . OXYGEN Inhale 3-4 L into the lungs at bedtime.    . predniSONE (DELTASONE) 20 MG tablet Take 3 tabs daily for 1 week, then 2 tabs daily for week 2, then 1 tab daily for week 3. 42 tablet 0  . rosuvastatin (CRESTOR) 40 MG tablet Take 1 tablet (40 mg total) by mouth every evening. 90 tablet 1  . tolterodine (DETROL) 1 MG tablet TAKE 1 TABLET BY MOUTH TWICE A DAY 180 tablet 1  . traMADol (ULTRAM) 50 MG tablet TAKE 1 TABLET EVERY 12 HOURS AS NEEDED FOR MODERATE OR SEVERE PAIN (Patient taking differently: Take 50 mg by mouth 2 (two) times daily. ) 60 tablet 2  . traZODone (DESYREL) 50 MG tablet TAKE 1 TABLET (50 MG TOTAL) BY MOUTH NIGHTLY AS NEEDED FOR FOR SLEEP. (Patient taking differently: Take 50 mg by mouth at bedtime. ) 90 tablet 1  . Triamcinolone Acetonide (TRIAMCINOLONE 0.1 % CREAM : EUCERIN) CREA Apply 1 application topically 2 (two) times daily. 1 each 1  . triamcinolone cream (KENALOG) 0.1 % APPLY  TOPICALLY TWO TIMES DAILY (Patient taking differently: Apply 1 application topically 2 (two) times daily as needed (rash). ) 80 g 0   No current facility-administered medications for this visit.     Past Medical History:  Diagnosis Date  . Arthritis    'all over"  . Asthma   . Chronic pain   . COPD (chronic obstructive pulmonary disease) (HCC)    O2 per [place] at nights   . Depression   . Diabetes mellitus without complication ([place])    borderline- , states she was on med., but MD told her "everything is under control so I threw the bottle away"  . Fibromyalgia   . GERD (gastroesophageal reflux  disease)   . Hypertension   . Neuromuscular disorder (HCC)    parkinson, neuropathy- both feet & hands.  . Stroke [facility])    residual dysarthria    Past Surgical History:  Procedure Laterality Date  . ABDOMINAL HYSTERECTOMY    . ANTERIOR CERVICAL DECOMP/DISCECTOMY FUSION N/A 07/10/2015   Procedure: Cervical five-six, Cervical six-seven anterior cervical decompression with fusion plating and bonegraft;  Surgeon: Jovita Gamma, MD;  Location: [facility];  Service: Neurosurgery;  Laterality: N/A;  . APPENDECTOMY    . BIOPSY EYE MUSCLE  03/20/2016   biopsy vessel to right eye due to swelling  . EYE SURGERY Bilateral    cataracts removed, /w "cyrstal lenses"   . LEFT HEART CATH AND CORONARY ANGIOGRAPHY N/A 05/23/2018   Procedure: LEFT HEART CATH AND CORONARY ANGIOGRAPHY;  Surgeon: Troy Sine, MD;  Location: [facility];  Service: Cardiovascular;  Laterality: N/A;  . SHOULDER ARTHROSCOPY Right    x2   RCR- spurs removed     Social History   Socioeconomic History  . Marital status: Married    Spouse name: wayne  . Number of children: 2  . Years of education: 16  . Highest education level: Bachelor's degree (e.g., BA, AB, BS)  Occupational History  . Occupation: retired    Comment: Automotive engineer  Social Needs  . Financial resource strain: Not hard at all  . Food  insecurity:    Worry: Never true    Inability: Never true  . Transportation needs:    Medical: No    Non-medical: No  Tobacco Use  . Smoking status: Never Smoker  . Smokeless tobacco: Never Used  Substance and Sexual Activity  . Alcohol use: No  . Drug use: No  . Sexual activity: Yes  Lifestyle  . Physical activity:    Days per week: 0 days    Minutes per session: 0 min  . Stress: Not at all  Relationships  . Social connections:    Talks on phone: More than three times a week    Gets together: More than three times a week    Attends religious service: More than 4 times per year    Active member of club or organization: Yes    Attends meetings of clubs or organizations: More than 4 times per year    Relationship status: Married  . Intimate partner violence:    Fear of current or ex partner: Not on file    Emotionally abused: Not on file    Physically abused: Not on file    Forced sexual activity: Not on file  Other Topics Concern  . Not on file  Social History Narrative  . Not on file     Vitals:   06/23/18 1324  BP: 140/64  Pulse: 71  SpO2: 93%  Weight: 208 lb (94.3 kg)  Height: 5\' 5"  (1.651 m)    Wt Readings from Last 3 Encounters:  06/23/18 208 lb (94.3 kg)  06/20/18 205 lb 12.8 oz (93.4 kg)  06/09/18 201 lb (91.2 kg)     PHYSICAL EXAM General: NAD HEENT: Normal. Neck: No JVD, no thyromegaly. Lungs: Clear to auscultation bilaterally with normal respiratory effort. CV: Regular rate and rhythm, normal S1/S2, no S3/S4, no murmur. No pretibial or periankle edema.     Abdomen: Soft, nontender, no distention.  Neurologic: Alert.  Dysarthric. Psych: Normal affect. Skin: Normal. Musculoskeletal: No gross deformities.    ECG: Reviewed above under Subjective   Labs:  Lab Results  Component Value Date/Time   K 4.3 05/23/2018 06:55 AM   BUN 25 05/19/2018 11:51 AM   BUN 18 04/10/2018 02:54 PM   CREATININE 0.99 (H) 05/19/2018 11:51 AM   ALT 10  03/23/2017 03:53 PM   TSH 3.699 03/22/2016 06:31 AM   HGB 13.3 05/23/2018 06:55 AM   HGB 12.4 11/06/2015 12:57 PM     Lipids: Lab Results  Component Value Date/Time   LDLCALC 180 (H) 05/19/2018 11:51 AM   CHOL 260 (H) 05/19/2018 11:51 AM   CHOL 255 (H) 05/03/2017 11:15 AM   TRIG 201 (H) 05/19/2018 11:51 AM   HDL 43 (L) 05/19/2018 11:51 AM   HDL 59 05/03/2017 11:15 AM      Coronary angiography 05/23/2018:   Prox RCA to Mid RCA lesion is 100% stenosed.  Prox RCA lesion is 60% stenosed.  Dist LM lesion is 60% stenosed.  Ost Cx to Prox Cx lesion is 50% stenosed.  Ost LAD to Prox LAD lesion is 30% stenosed.  Ost 1st Diag lesion is 50% stenosed.  Mid LAD lesion is 70% stenosed.  Prox LAD lesion is 30% stenosed.  Prox Cx to Mid Cx lesion is 20% stenosed.   Multivessel CAD with 60% eccentric distal left main stenosis extending into the ostium of the circumflex vessel; 30% calcified proximal LAD stenosis, 50% ostial diagonal stenosis, 30% mid LAD stenosis followed by 70% mid-distal LAD stenosis; 20% proximal circumflex irregularity and total occlusion of the proximal to mid RCA with 60% very proximal stenosis and evidence for both bridging antegrade collaterals as well as extensive left-to-right retrograde collaterals supplying the distal RCA, PDA and PLA  System.  LVEDP 24 mm Hg.  RECOMMENDATION: A 2D echo Doppler study be performed as an outpatient to assess for LV function.  The patient was significantly hypertensive.  Recommend increase anti-anginal regimen and will initiate nitrates and increased beta-blocker therapy.  There is reported allergy to amlodipine.  Recommend imminent follow-up with Dr. Bronson Ing.  Consider surgical consultation for potential CABG revascularization versus aggressive medical therapy trial.   ASSESSMENT AND PLAN: 1.  Multivessel coronary artery disease: She is not a candidate for CABG.  Medical therapy will be pursued.  Symptomatically stable.   Currently on carvedilol, Plavix, and Imdur.  It appears she has not been taking rosuvastatin.  I will prescribe rosuvastatin 40 mg.  2.  Hypertension: Blood pressure is very mildly elevated.  No changes to therapy today.  3.  Hyperlipidemia: LDL 180.  It appears she has not been taking rosuvastatin.  I explained the importance of statin therapy for both heart disease and given her history of CVA.  I will prescribe rosuvastatin 40 mg.  4.  History of stroke: Currently on Plavix, carvedilol, and lisinopril.  I will start rosuvastatin 40 mg.   Disposition: Follow up 3 to 4 months   Kate Sable, M.D., F.A.C.C.

## 2018-06-23 NOTE — Patient Instructions (Signed)
Medication Instructions:  Take Crestor 40 mg at dinner  All other medicines stay the same  If you need a refill on your cardiac medications before your next appointment, please call your pharmacy.   Lab work: None today If you have labs (blood work) drawn today and your tests are completely normal, you will receive your results only by: Marland Kitchen MyChart Message (if you have MyChart) OR . A paper copy in the mail If you have any lab test that is abnormal or we need to change your treatment, we will call you to review the results.  Testing/Procedures: None today  Follow-Up: At [facility], you and your health needs are our priority.  As part of our continuing mission to provide you with exceptional heart care, we have created designated Provider Care Teams.  These Care Teams include your primary Cardiologist (physician) and Advanced Practice Providers (APPs -  Physician Assistants and Nurse Practitioners) who all work together to provide you with the care you need, when you need it. You will need a follow up appointment in 4 months.  Please call our office 2 months in advance to schedule this appointment.  You may see Kate Sable, MD or one of the following Advanced Practice Providers on your designated Care Team:   Bernerd Pho, PA-C [facility]) . Ermalinda Barrios, PA-C ([place])  Any Other Special Instructions Will Be Listed Below (If Applicable). None

## 2018-06-24 ENCOUNTER — Other Ambulatory Visit: Payer: Self-pay | Admitting: Pediatrics

## 2018-06-24 DIAGNOSIS — K219 Gastro-esophageal reflux disease without esophagitis: Secondary | ICD-10-CM

## 2018-06-26 ENCOUNTER — Ambulatory Visit (INDEPENDENT_AMBULATORY_CARE_PROVIDER_SITE_OTHER): Payer: Medicare HMO | Admitting: Family Medicine

## 2018-06-26 ENCOUNTER — Encounter: Payer: Self-pay | Admitting: Family Medicine

## 2018-06-26 VITALS — BP 172/84 | HR 69 | Temp 97.2°F | Ht 65.0 in | Wt 207.0 lb

## 2018-06-26 DIAGNOSIS — K219 Gastro-esophageal reflux disease without esophagitis: Secondary | ICD-10-CM | POA: Diagnosis not present

## 2018-06-26 DIAGNOSIS — R918 Other nonspecific abnormal finding of lung field: Secondary | ICD-10-CM

## 2018-06-26 MED ORDER — FAMOTIDINE 20 MG PO TABS: 20.0000 mg | ORAL_TABLET | Freq: Two times a day (BID) | ORAL | 1 refills | Status: DC | PRN

## 2018-06-26 NOTE — Progress Notes (Signed)
Subjective:  Patient ID: Susan Patel, female    DOB: July 30, 1940, 78 y.o.   MRN: [id number]  Chief Complaint:  Discuss abnormal CT results   HPI: Susan Patel is a 78 y.o. female presenting on 06/26/2018 for Discuss abnormal CT results  Pt presents today to discuss the findings of her CT scan that was completed on 01/10/2018. The scan revealed several lung nodules with the largest measuring 9 mm. Pt denies hemoptysis, cough, fever, chills, unexplained weight loss, or night sweats. She states she is "fine".  Pt states she needs a refill on her Pepcid. States her GERD is well controlled with the Pepcid. She denies break through symptoms. No dysphagia, changes in voice, or cough.    Relevant past medical, surgical, family, and social history reviewed and updated as indicated.  Allergies and medications reviewed and updated.   Past Medical History:  Diagnosis Date  . Arthritis    'all over"  . Asthma   . Chronic pain   . COPD (chronic obstructive pulmonary disease) (HCC)    O2 per [place] at nights   . Depression   . Diabetes mellitus without complication ([facility])    borderline- , states she was on med., but MD told her "everything is under control so I threw the bottle away"  . Fibromyalgia   . GERD (gastroesophageal reflux disease)   . Hypertension   . Neuromuscular disorder (HCC)    parkinson, neuropathy- both feet & hands.  . 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 ABDOMINAL HYSTERECTOMY    . ANTERIOR CERVICAL DECOMP/DISCECTOMY FUSION N/A 07/10/2015   Procedure: Cervical five-six, Cervical six-seven anterior cervical decompression with fusion plating and bonegraft;  Surgeon: Jovita Gamma, MD;  Location: [facility];  Service: Neurosurgery;  Laterality: N/A;  . APPENDECTOMY    . BIOPSY EYE MUSCLE  03/20/2016   biopsy vessel to right eye due to swelling  . EYE SURGERY Bilateral    cataracts removed, /w "cyrstal lenses"    . LEFT HEART CATH AND CORONARY ANGIOGRAPHY N/A 05/23/2018   Procedure: LEFT HEART CATH AND CORONARY ANGIOGRAPHY;  Surgeon: Troy Sine, MD;  Location: [facility] CV LAB;  Service: Cardiovascular;  Laterality: N/A;  . SHOULDER ARTHROSCOPY Right    x2   RCR- spurs removed     Social History   Socioeconomic History  . Marital status: Married    Spouse name: wayne  . Number of children: 2  . Years of education: 16  . Highest education level: Bachelor's degree (e.g., BA, AB, BS)  Occupational History  . Occupation: retired    Comment: Automotive engineer  Social Needs  . Financial resource strain: Not hard at all  . Food insecurity:    Worry: Never true    Inability: Never true  . Transportation needs:    Medical: No    Non-medical: No  Tobacco Use  . Smoking status: Never Smoker  . Smokeless tobacco: Never Used  Substance and Sexual Activity  . Alcohol use: No  . Drug use: No  . Sexual activity: Yes  Lifestyle  . Physical activity:    Days per week: 0 days    Minutes per session: 0 min  . Stress: Not at all  Relationships  . Social connections:    Talks on phone: More than three times a week    Gets together: More than three times a week  Attends religious service: More than 4 times per year    Active member of club or organization: Yes    Attends meetings of clubs or organizations: More than 4 times per year    Relationship status: Married  . Intimate partner violence:    Fear of current or ex partner: Not on file    Emotionally abused: Not on file    Physically abused: Not on file    Forced sexual activity: Not on file  Other Topics Concern  . Not on file  Social History Narrative  . Not on file    Outpatient Encounter Medications as of 06/26/2018  Medication Sig  . acetaminophen (TYLENOL) 325 MG tablet Take 2 tablets (650 mg total) by mouth every 6 (six) hours as needed. (Patient taking differently: Take 650 mg by mouth every 6 (six) hours as  needed for moderate pain or headache. )  . albuterol (PROVENTIL HFA;VENTOLIN HFA) 108 (90 Base) MCG/ACT inhaler Inhale 1-2 puffs into the lungs every 6 (six) hours as needed for wheezing or shortness of breath.  Marland Kitchen amoxicillin-clavulanate (AUGMENTIN) 875-125 MG tablet Take 1 tablet by mouth 2 (two) times daily.  . carvedilol (COREG) 6.25 MG tablet Take 1 tablet (6.25 mg total) by mouth 2 (two) times daily.  . cetirizine (ZYRTEC) 10 MG tablet Take 10 mg by mouth 2 (two) times daily as needed for allergies.  Marland Kitchen clopidogrel (PLAVIX) 75 MG tablet TAKE 1 TABLET BY MOUTH EVERY DAY  . DULoxetine (CYMBALTA) 30 MG capsule TAKE 2 CAPSULES (60 MG TOTAL) BY MOUTH DAILY.  . famotidine (PEPCID) 20 MG tablet Take 1 tablet (20 mg total) by mouth 2 (two) times daily as needed for heartburn or indigestion.  . fluticasone (FLONASE) 50 MCG/ACT nasal spray Place 2 sprays into both nostrils daily. (Patient taking differently: Place 2 sprays into both nostrils daily as needed for allergies. )  . fluticasone furoate-vilanterol (BREO ELLIPTA) 200-25 MCG/INH AEPB Inhale 1 puff into the lungs daily. (Patient taking differently: Inhale 1 puff into the lungs daily as needed (shortness). )  . gabapentin (NEURONTIN) 300 MG capsule TAKE 1 CAPSULE BY MOUTH THREE TIMES A DAY (Patient taking differently: Take 300 mg by mouth 2 (two) times daily. )  . glimepiride (AMARYL) 2 MG tablet TAKE 1 TABLET (2 MG TOTAL) BY MOUTH DAILY BEFORE BREAKFAST.  Marland Kitchen glucose blood test strip Use as instructed  . HYDROcodone-homatropine (HYCODAN) 5-1.5 MG/5ML syrup Take 5 mLs by mouth every 6 (six) hours as needed for cough.  . isosorbide mononitrate (IMDUR) 30 MG 24 hr tablet Take 1 tablet (30 mg total) by mouth daily.  . Lancets (ONETOUCH ULTRASOFT) lancets Use as instructed  . latanoprost (XALATAN) 0.005 % ophthalmic solution Place 1 drop into both eyes at bedtime.  . Lidocaine 4 % PTCH Apply 1 patch topically 2 (two) times daily. (Patient taking  differently: Apply 1 patch topically 2 (two) times daily as needed (pain). )  . LINZESS 290 MCG CAPS capsule TAKE 1 CAPSULE BY MOUTH DAILY BEFORE BREAKFAST.  Marland Kitchen lisinopril (PRINIVIL,ZESTRIL) 20 MG tablet TAKE 2 TABLETS BY MOUTH EVERY DAY (Patient taking differently: Take 20 mg by mouth 2 (two) times daily. )  . montelukast (SINGULAIR) 10 MG tablet TAKE 1 TABLET BY MOUTH EVERYDAY AT BEDTIME  . nabumetone (RELAFEN) 500 MG tablet Take 1 tablet (500 mg total) by mouth 2 (two) times daily. For muscle and joint pain  . naproxen sodium (ALEVE) 220 MG tablet Take 440 mg by mouth 2 (two) times  daily as needed (pain).  . nitroGLYCERIN (NITROSTAT) 0.4 MG SL tablet Place 1 tablet (0.4 mg total) under the tongue every 5 (five) minutes as needed.  Glory Rosebush DELICA LANCETS FINE MISC TEST TWICE DAILY  . OXYGEN Inhale 3-4 L into the lungs at bedtime.  . predniSONE (DELTASONE) 20 MG tablet Take 3 tabs daily for 1 week, then 2 tabs daily for week 2, then 1 tab daily for week 3.  . rosuvastatin (CRESTOR) 40 MG tablet Take 1 tablet (40 mg total) by mouth every evening.  . tolterodine (DETROL) 1 MG tablet TAKE 1 TABLET BY MOUTH TWICE A DAY  . traMADol (ULTRAM) 50 MG tablet TAKE 1 TABLET EVERY 12 HOURS AS NEEDED FOR MODERATE OR SEVERE PAIN (Patient taking differently: Take 50 mg by mouth 2 (two) times daily. )  . traZODone (DESYREL) 50 MG tablet TAKE 1 TABLET (50 MG TOTAL) BY MOUTH NIGHTLY AS NEEDED FOR FOR SLEEP. (Patient taking differently: Take 50 mg by mouth at bedtime. )  . Triamcinolone Acetonide (TRIAMCINOLONE 0.1 % CREAM : EUCERIN) CREA Apply 1 application topically 2 (two) times daily.  Marland Kitchen triamcinolone cream (KENALOG) 0.1 % APPLY TOPICALLY TWO TIMES DAILY (Patient taking differently: Apply 1 application topically 2 (two) times daily as needed (rash). )  . [DISCONTINUED] famotidine (PEPCID) 20 MG tablet Take 1 tablet (20 mg total) by mouth 2 (two) times daily as needed for heartburn or indigestion.  .  [DISCONTINUED] famotidine (PEPCID) 20 MG tablet TAKE 1 TABLET (20 MG TOTAL) 2 (TWO) TIMES DAILY AS NEEDED BY MOUTH FOR HEARTBURN OR INDIGESTION.   No facility-administered encounter medications on file as of 06/26/2018.     Allergies  Allergen Reactions  . Azithromycin Other (See Comments)    Renal failure  . Prednisone Other (See Comments)    Irritability and insomnia  . Sulfamethoxazole-Trimethoprim Other (See Comments)    Renal failure  . Amlodipine     Caused swelling in legs  . Ciprofloxacin Itching and Rash  . Tape Rash    Review of Systems  Constitutional: Negative for activity change, appetite change, chills, fatigue, fever and unexpected weight change.  HENT: Negative for sore throat, trouble swallowing and voice change.   Respiratory: Negative for apnea, cough, choking, chest tightness, shortness of breath, wheezing and stridor.   Cardiovascular: Negative for chest pain and palpitations.  Gastrointestinal: Negative for abdominal pain, anal bleeding, blood in stool, constipation, diarrhea, nausea and vomiting.  Neurological: Negative for dizziness, weakness and headaches.  All other systems reviewed and are negative.       Objective:  BP (!) 172/84   Pulse 69   Temp (!) 97.2 F (36.2 C) (Oral)   Ht 5\' 5"  (1.651 m)   Wt 207 lb (93.9 kg)   BMI 34.45 kg/m    Wt Readings from Last 3 Encounters:  06/26/18 207 lb (93.9 kg)  06/23/18 208 lb (94.3 kg)  06/20/18 205 lb 12.8 oz (93.4 kg)    Physical Exam Vitals signs and nursing note reviewed.  Constitutional:      Appearance: Normal appearance.  HENT:     Head: Normocephalic and atraumatic.     Mouth/Throat:     Mouth: Mucous membranes are moist.     Pharynx: Oropharynx is clear.  Eyes:     Conjunctiva/sclera: Conjunctivae normal.     Pupils: Pupils are equal, round, and reactive to light.  Cardiovascular:     Rate and Rhythm: Normal rate and regular rhythm.  Heart sounds: Normal heart sounds. No murmur.  No friction rub. No gallop.   Pulmonary:     Effort: Pulmonary effort is normal. No respiratory distress.     Breath sounds: Normal breath sounds.  Skin:    General: Skin is warm and dry.     Capillary Refill: Capillary refill takes less than 2 seconds.  Neurological:     Mental Status: She is alert and oriented to person, place, and time.  Psychiatric:        Mood and Affect: Mood normal.        Behavior: Behavior normal.        Thought Content: Thought content normal.        Judgment: Judgment normal.     Results for orders placed or performed during the hospital encounter of 05/23/18  Glucose, capillary  Result Value Ref Range   Glucose-Capillary 162 (H) 70 - 99 mg/dL  Glucose, capillary  Result Value Ref Range   Glucose-Capillary 129 (H) 70 - 99 mg/dL  I-STAT 4, (NA,K, GLUC, HGB,HCT)  Result Value Ref Range   Sodium 136 135 - 145 mmol/L   Potassium 4.3 3.5 - 5.1 mmol/L   Glucose, Bld 174 (H) 70 - 99 mg/dL   HCT 39.0 36.0 - 46.0 %   Hemoglobin 13.3 12.0 - 15.0 g/dL       Pertinent labs & imaging results that were available during my care of the patient were reviewed by me and considered in my medical decision making.  Assessment & Plan:  Zakyah was seen today for discuss abnormal ct results.  Diagnoses and all orders for this visit:  Multiple lung nodules Multiple nodules found on CT of chest on 01/10/2018. recommended 3-6 month follow up CT. Ordered today. Pt aware they will call to schedule scan.  -     CT Chest Wo Contrast; Future  Gastroesophageal reflux disease, esophagitis presence not specified Stable, continue below.  -     famotidine (PEPCID) 20 MG tablet; Take 1 tablet (20 mg total) by mouth 2 (two) times daily as needed for heartburn or indigestion.     Continue all other maintenance medications.  Follow up plan: Return in about 3 months (around 09/24/2018), or if symptoms worsen or fail to improve.  PCP is Dr. Warrick Parisian. Pt is due to renew her  controlled substance contract. Appointment made. Pt aware of clinic policy on controlled substances.   The above assessment and management plan was discussed with the patient. The patient verbalized understanding of and has agreed to the management plan. Patient is aware to call the clinic if symptoms persist or worsen. Patient is aware when to return to the clinic for a follow-up visit. Patient educated on when it is appropriate to go to the emergency department.   Monia Pouch, FNP-C [facility] [phone]

## 2018-06-26 NOTE — Patient Instructions (Signed)
You will be called to schedule the lung CT

## 2018-06-29 ENCOUNTER — Ambulatory Visit (INDEPENDENT_AMBULATORY_CARE_PROVIDER_SITE_OTHER): Payer: Medicare HMO | Admitting: Family Medicine

## 2018-06-29 ENCOUNTER — Encounter: Payer: Self-pay | Admitting: Family Medicine

## 2018-06-29 VITALS — BP 151/73 | HR 73 | Temp 97.0°F | Ht 65.0 in | Wt 203.8 lb

## 2018-06-29 DIAGNOSIS — IMO0001 Reserved for inherently not codable concepts without codable children: Secondary | ICD-10-CM

## 2018-06-29 DIAGNOSIS — R918 Other nonspecific abnormal finding of lung field: Secondary | ICD-10-CM

## 2018-06-29 DIAGNOSIS — G8929 Other chronic pain: Secondary | ICD-10-CM

## 2018-06-29 DIAGNOSIS — R911 Solitary pulmonary nodule: Secondary | ICD-10-CM

## 2018-06-29 DIAGNOSIS — J441 Chronic obstructive pulmonary disease with (acute) exacerbation: Secondary | ICD-10-CM | POA: Diagnosis not present

## 2018-06-29 MED ORDER — HYDROCODONE-HOMATROPINE 5-1.5 MG/5ML PO SYRP: 5.0000 mL | ORAL_SOLUTION | Freq: Four times a day (QID) | ORAL | 0 refills | Status: DC | PRN

## 2018-06-29 MED ORDER — TRAMADOL HCL 50 MG PO TABS: ORAL_TABLET | ORAL | 2 refills | Status: DC

## 2018-06-29 NOTE — Progress Notes (Signed)
BP (!) 151/73   Pulse 73   Temp (!) 97 F (36.1 C) (Oral)   Ht 5\' 5"  (1.651 m)   Wt 203 lb 12.8 oz (92.4 kg)   BMI 33.91 kg/m    Subjective:    Patient ID: Susan Patel, female    DOB: [birth year], 78 y.o.   MRN: [id number]  HPI: Susan Patel is a 78 y.o. female presenting on 06/29/2018 for Medication Refill (tramodol)   HPI Chronic back and hip and joint pain medication refill Patient is coming in today for medication refill on her tramadol for her chronic hip and low back pain.  She says that the tramadol keeps it pretty well under [place].  She does have some mild dementia and her husband is here with her him and he describes that she does pretty well with that and keeps it mostly under control.  He denies any episodes of lightheadedness or dizziness or syncope.  She does get somewhat sedated with it so he tries to use it sparingly for the pain but says that she has been doing well on it and then she uses Tylenol along with it to help with the intermediary pain at other times.  Patient is also coming in for follow-up on a lung nodule that was about 4 months ago that showed a couple of lung nodules, one is been newer and one is been stable from previous and she is due for repeat CT scan on this.  She denies any upper respiratory symptoms or cough or congestion or shortness of breath.  Relevant past medical, surgical, family and social history reviewed and updated as indicated. Interim medical history since our last visit reviewed. Allergies and medications reviewed and updated.  Review of Systems  Constitutional: Negative for chills and fever.  HENT: Negative for congestion.   Eyes: Negative for redness and visual disturbance.  Respiratory: Negative for chest tightness and shortness of breath.   Cardiovascular: Negative for chest pain and leg swelling.  Musculoskeletal: Positive for arthralgias, back pain and myalgias. Negative for gait problem and joint swelling.  Skin: Negative  for rash.  Neurological: Negative for light-headedness and headaches.  Psychiatric/Behavioral: Negative for agitation and behavioral problems.  All other systems reviewed and are negative.   Per HPI unless specifically indicated above   Allergies as of 06/29/2018      Reactions   Azithromycin Other (See Comments)   Renal failure   Prednisone Other (See Comments)   Irritability and insomnia   Sulfamethoxazole-trimethoprim Other (See Comments)   Renal failure   Amlodipine    Caused swelling in legs   Ciprofloxacin Itching, Rash   Tape Rash      Medication List       Accurate as of June 29, 2018 11:59 PM. Always use your most recent med list.        acetaminophen 325 MG tablet Commonly known as:  TYLENOL Take 2 tablets (650 mg total) by mouth every 6 (six) hours as needed.   albuterol 108 (90 Base) MCG/ACT inhaler Commonly known as:  PROVENTIL HFA;VENTOLIN HFA Inhale 1-2 puffs into the lungs every 6 (six) hours as needed for wheezing or shortness of breath.   carvedilol 6.25 MG tablet Commonly known as:  COREG Take 1 tablet (6.25 mg total) by mouth 2 (two) times daily.   cetirizine 10 MG tablet Commonly known as:  ZYRTEC Take 10 mg by mouth 2 (two) times daily as needed for allergies.   clopidogrel 75  MG tablet Commonly known as:  PLAVIX TAKE 1 TABLET BY MOUTH EVERY DAY   DULoxetine 30 MG capsule Commonly known as:  CYMBALTA TAKE 2 CAPSULES (60 MG TOTAL) BY MOUTH DAILY.   famotidine 20 MG tablet Commonly known as:  PEPCID Take 1 tablet (20 mg total) by mouth 2 (two) times daily as needed for heartburn or indigestion.   fluticasone 50 MCG/ACT nasal spray Commonly known as:  FLONASE Place 2 sprays into both nostrils daily.   fluticasone furoate-vilanterol 200-25 MCG/INH Aepb Commonly known as:  BREO ELLIPTA Inhale 1 puff into the lungs daily.   gabapentin 300 MG capsule Commonly known as:  NEURONTIN TAKE 1 CAPSULE BY MOUTH THREE TIMES A DAY     glimepiride 2 MG tablet Commonly known as:  AMARYL TAKE 1 TABLET (2 MG TOTAL) BY MOUTH DAILY BEFORE BREAKFAST.   glucose blood test strip Use as instructed   HYDROcodone-homatropine 5-1.5 MG/5ML syrup Commonly known as:  HYCODAN Take 5 mLs by mouth every 6 (six) hours as needed for cough.   isosorbide mononitrate 30 MG 24 hr tablet Commonly known as:  IMDUR Take 1 tablet (30 mg total) by mouth daily.   latanoprost 0.005 % ophthalmic solution Commonly known as:  XALATAN Place 1 drop into both eyes at bedtime.   Lidocaine 4 % Ptch Apply 1 patch topically 2 (two) times daily.   LINZESS 290 MCG Caps capsule Generic drug:  linaclotide TAKE 1 CAPSULE BY MOUTH DAILY BEFORE BREAKFAST.   lisinopril 20 MG tablet Commonly known as:  PRINIVIL,ZESTRIL TAKE 2 TABLETS BY MOUTH EVERY DAY   montelukast 10 MG tablet Commonly known as:  SINGULAIR TAKE 1 TABLET BY MOUTH EVERYDAY AT BEDTIME   nabumetone 500 MG tablet Commonly known as:  RELAFEN Take 1 tablet (500 mg total) by mouth 2 (two) times daily. For muscle and joint pain   naproxen sodium 220 MG tablet Commonly known as:  ALEVE Take 440 mg by mouth 2 (two) times daily as needed (pain).   nitroGLYCERIN 0.4 MG SL tablet Commonly known as:  NITROSTAT Place 1 tablet (0.4 mg total) under the tongue every 5 (five) minutes as needed.   onetouch ultrasoft lancets Use as instructed   ONETOUCH DELICA LANCETS FINE Misc TEST TWICE DAILY   OXYGEN Inhale 3-4 L into the lungs at bedtime.   predniSONE 20 MG tablet Commonly known as:  DELTASONE Take 3 tabs daily for 1 week, then 2 tabs daily for week 2, then 1 tab daily for week 3.   rosuvastatin 40 MG tablet Commonly known as:  CRESTOR Take 1 tablet (40 mg total) by mouth every evening.   tolterodine 1 MG tablet Commonly known as:  DETROL TAKE 1 TABLET BY MOUTH TWICE A DAY   traMADol 50 MG tablet Commonly known as:  ULTRAM TAKE 1 TABLET EVERY 12 HOURS AS NEEDED FOR MODERATE  OR SEVERE PAIN   traZODone 50 MG tablet Commonly known as:  DESYREL TAKE 1 TABLET (50 MG TOTAL) BY MOUTH NIGHTLY AS NEEDED FOR FOR SLEEP.   triamcinolone 0.1 % cream : eucerin Crea Apply 1 application topically 2 (two) times daily.   triamcinolone cream 0.1 % Commonly known as:  KENALOG APPLY TOPICALLY TWO TIMES DAILY          Objective:    BP (!) 151/73   Pulse 73   Temp (!) 97 F (36.1 C) (Oral)   Ht 5\' 5"  (1.651 m)   Wt 203 lb 12.8 oz (92.4 kg)  BMI 33.91 kg/m   Wt Readings from Last 3 Encounters:  06/29/18 203 lb 12.8 oz (92.4 kg)  06/26/18 207 lb (93.9 kg)  06/23/18 208 lb (94.3 kg)    Physical Exam Vitals signs and nursing note reviewed.  Constitutional:      General: She is not in acute distress.    Appearance: She is well-developed. She is not diaphoretic.  Eyes:     Conjunctiva/sclera: Conjunctivae normal.  Cardiovascular:     Rate and Rhythm: Normal rate and regular rhythm.     Heart sounds: Normal heart sounds. No murmur.  Pulmonary:     Effort: Pulmonary effort is normal. No respiratory distress.     Breath sounds: Normal breath sounds. No wheezing.  Musculoskeletal: Normal range of motion.        General: Tenderness (Bilateral low back tenderness, mild, negative straight leg raise) present.  Skin:    General: Skin is warm and dry.     Findings: No rash.  Neurological:     Mental Status: She is alert and oriented to person, place, and time.     Coordination: Coordination normal.  Psychiatric:        Behavior: Behavior normal.         Assessment & Plan:   Problem List Items Addressed This Visit    None    Visit Diagnoses    Lung nodule < 6cm on CT    -  Primary   COPD with acute exacerbation (HCC)       Relevant Medications   HYDROcodone-homatropine (HYCODAN) 5-1.5 MG/5ML syrup   Other chronic pain       Relevant Medications   traMADol (ULTRAM) 50 MG tablet   Abnormal findings on diagnostic imaging of lung        Refill tramadol  and Hycodan which she uses for cough and congestion.  Will order a repeat CT scan for follow-up.  Follow up plan: Return in about 3 months (around 09/27/2018), or if symptoms worsen or fail to improve, for Diabetes and COPD.  Counseling provided for all of the vaccine components No orders of the defined types were placed in this encounter.   Caryl Pina, MD [facility] 07/05/2018, 6:45 PM

## 2018-07-01 ENCOUNTER — Other Ambulatory Visit: Payer: Self-pay | Admitting: Pediatrics

## 2018-07-01 DIAGNOSIS — J3089 Other allergic rhinitis: Secondary | ICD-10-CM

## 2018-07-02 ENCOUNTER — Other Ambulatory Visit: Payer: Self-pay | Admitting: Pediatrics

## 2018-07-02 DIAGNOSIS — M797 Fibromyalgia: Secondary | ICD-10-CM

## 2018-07-03 ENCOUNTER — Telehealth: Payer: Self-pay | Admitting: Family Medicine

## 2018-07-03 NOTE — Telephone Encounter (Signed)
Patient aware medication was sent to pharmacy on 2/13

## 2018-07-08 ENCOUNTER — Other Ambulatory Visit: Payer: Self-pay | Admitting: Family Medicine

## 2018-07-08 DIAGNOSIS — J441 Chronic obstructive pulmonary disease with (acute) exacerbation: Secondary | ICD-10-CM

## 2018-07-13 ENCOUNTER — Ambulatory Visit (HOSPITAL_COMMUNITY): Payer: Medicare HMO

## 2018-07-21 ENCOUNTER — Ambulatory Visit (HOSPITAL_COMMUNITY): Payer: Medicare HMO | Attending: Family Medicine

## 2018-07-28 ENCOUNTER — Other Ambulatory Visit: Payer: Self-pay | Admitting: Physical Medicine & Rehabilitation

## 2018-07-28 DIAGNOSIS — R232 Flushing: Secondary | ICD-10-CM

## 2018-07-31 ENCOUNTER — Ambulatory Visit (INDEPENDENT_AMBULATORY_CARE_PROVIDER_SITE_OTHER): Payer: Medicare HMO | Admitting: Family Medicine

## 2018-07-31 ENCOUNTER — Encounter: Payer: Self-pay | Admitting: Family Medicine

## 2018-07-31 ENCOUNTER — Ambulatory Visit (HOSPITAL_COMMUNITY)
Admission: RE | Admit: 2018-07-31 | Discharge: 2018-07-31 | Disposition: A | Discharge status: Home / Self Care | Payer: Medicare HMO | Source: Ambulatory Visit | Attending: Family Medicine | Admitting: Family Medicine

## 2018-07-31 ENCOUNTER — Other Ambulatory Visit: Payer: Self-pay

## 2018-07-31 VITALS — BP 140/70 | HR 86 | Temp 97.1°F | Ht 65.0 in | Wt 201.4 lb

## 2018-07-31 DIAGNOSIS — K5900 Constipation, unspecified: Secondary | ICD-10-CM

## 2018-07-31 DIAGNOSIS — M5412 Radiculopathy, cervical region: Secondary | ICD-10-CM | POA: Diagnosis not present

## 2018-07-31 DIAGNOSIS — J3089 Other allergic rhinitis: Secondary | ICD-10-CM | POA: Diagnosis not present

## 2018-07-31 DIAGNOSIS — E1142 Type 2 diabetes mellitus with diabetic polyneuropathy: Secondary | ICD-10-CM

## 2018-07-31 DIAGNOSIS — K2100 Gastro-esophageal reflux disease with esophagitis, without bleeding: Secondary | ICD-10-CM

## 2018-07-31 DIAGNOSIS — G894 Chronic pain syndrome: Secondary | ICD-10-CM

## 2018-07-31 DIAGNOSIS — I6990 Unspecified sequelae of unspecified cerebrovascular disease: Secondary | ICD-10-CM

## 2018-07-31 DIAGNOSIS — R918 Other nonspecific abnormal finding of lung field: Secondary | ICD-10-CM | POA: Diagnosis not present

## 2018-07-31 DIAGNOSIS — E669 Obesity, unspecified: Secondary | ICD-10-CM | POA: Diagnosis not present

## 2018-07-31 DIAGNOSIS — G4700 Insomnia, unspecified: Secondary | ICD-10-CM | POA: Diagnosis not present

## 2018-07-31 DIAGNOSIS — M797 Fibromyalgia: Secondary | ICD-10-CM | POA: Diagnosis not present

## 2018-07-31 DIAGNOSIS — E782 Mixed hyperlipidemia: Secondary | ICD-10-CM | POA: Diagnosis not present

## 2018-07-31 DIAGNOSIS — I2510 Atherosclerotic heart disease of native coronary artery without angina pectoris: Secondary | ICD-10-CM

## 2018-07-31 DIAGNOSIS — G8929 Other chronic pain: Secondary | ICD-10-CM | POA: Diagnosis not present

## 2018-07-31 DIAGNOSIS — I10 Essential (primary) hypertension: Secondary | ICD-10-CM

## 2018-07-31 LAB — BAYER DCA HB A1C WAIVED: HB A1C (BAYER DCA - WAIVED): 8.5 % — ABNORMAL HIGH (ref <7.0)

## 2018-07-31 MED ORDER — LISINOPRIL 20 MG PO TABS: 20.0000 mg | ORAL_TABLET | Freq: Two times a day (BID) | ORAL | 3 refills | Status: DC

## 2018-07-31 MED ORDER — DULOXETINE HCL 30 MG PO CPEP: 60.0000 mg | ORAL_CAPSULE | Freq: Every day | ORAL | 3 refills | Status: DC

## 2018-07-31 MED ORDER — CLOPIDOGREL BISULFATE 75 MG PO TABS: 75.0000 mg | ORAL_TABLET | Freq: Every day | ORAL | 3 refills | Status: DC

## 2018-07-31 MED ORDER — FAMOTIDINE 20 MG PO TABS: 20.0000 mg | ORAL_TABLET | Freq: Two times a day (BID) | ORAL | 1 refills | Status: DC | PRN

## 2018-07-31 MED ORDER — ROSUVASTATIN CALCIUM 40 MG PO TABS: 40.0000 mg | ORAL_TABLET | Freq: Every evening | ORAL | 3 refills | Status: DC

## 2018-07-31 MED ORDER — LINACLOTIDE 290 MCG PO CAPS: ORAL_CAPSULE | ORAL | 1 refills | Status: DC

## 2018-07-31 MED ORDER — GABAPENTIN 300 MG PO CAPS: ORAL_CAPSULE | ORAL | 1 refills | Status: DC

## 2018-07-31 MED ORDER — TRAZODONE HCL 50 MG PO TABS: 50.0000 mg | ORAL_TABLET | Freq: Every day | ORAL | 3 refills | Status: DC

## 2018-07-31 MED ORDER — MONTELUKAST SODIUM 10 MG PO TABS: ORAL_TABLET | ORAL | 3 refills | Status: DC

## 2018-07-31 MED ORDER — CARVEDILOL 6.25 MG PO TABS: 6.2500 mg | ORAL_TABLET | Freq: Two times a day (BID) | ORAL | 3 refills | Status: DC

## 2018-07-31 MED ORDER — GLIMEPIRIDE 2 MG PO TABS: ORAL_TABLET | ORAL | 3 refills | Status: DC

## 2018-07-31 MED ORDER — TRAMADOL HCL 50 MG PO TABS: ORAL_TABLET | ORAL | 2 refills | Status: DC

## 2018-07-31 NOTE — Progress Notes (Signed)
BP 140/70   Pulse 86   Temp (!) 97.1 F (36.2 C) (Oral)   Ht _0  (1.651 m)   Wt 201 lb 6.4 oz (91.4 kg)   BMI 33.51 kg/m    Subjective:    Patient ID: Susan Patel, female    DOB: 17-Jan-1941, 78 y.o.   MRN: [id number]  HPI: Susan Patel is a 78 y.o. female presenting on 07/31/2018 for Medical Management of Chronic Issues   HPI Hypertension Patient is currently on lisinopril and Imdur and carvedilol, and their blood pressure today is 140/70. Patient denies any lightheadedness or dizziness. Patient denies headaches, blurred vision, chest pains, shortness of breath, or weakness. Denies any side effects from medication and is content with current medication.   Hyperlipidemia Patient is coming in for recheck of his hyperlipidemia. The patient is currently taking Crestor. They deny any issues with myalgias or history of liver damage from it. They deny any focal numbness or weakness or chest pain.   GERD Patient is currently on Pepcid.  She denies any major symptoms or abdominal pain or belching or burping. She denies any blood in her stool or lightheadedness or dizziness.   Type 2 diabetes mellitus Patient comes in today for recheck of his diabetes. Patient has been currently taking it but patient has not been taking it like she should and has not been checking sugars either.. Patient is currently on an ACE inhibitor/ARB. Patient has not seen an ophthalmologist this year. Patient denies any new issues with their feet.  Patient does have some neuropathy along with fibromyalgia  Patient is coming in for recheck and refill of her medications for other chronic issues including fibromyalgia and constipation and allergic rhinitis and cervical radicular pain, patient is gabapentin and Cymbalta and tramadol and trazodone  Relevant past medical, surgical, family and social history reviewed and updated as indicated. Interim medical history since our last visit reviewed. Allergies and  medications reviewed and updated.  Review of Systems  Constitutional: Negative for chills and fever.  HENT: Negative for congestion, ear discharge and ear pain.   Eyes: Negative for redness and visual disturbance.  Respiratory: Negative for chest tightness and shortness of breath.   Cardiovascular: Negative for chest pain and leg swelling.  Genitourinary: Negative for difficulty urinating and dysuria.  Musculoskeletal: Positive for arthralgias, back pain and myalgias. Negative for gait problem.  Skin: Negative for rash.  Neurological: Positive for weakness (Patient has left-sided weakness from previous stroke mental confusion from it as well) and numbness. Negative for light-headedness and headaches.  Psychiatric/Behavioral: Negative for agitation and behavioral problems.  All other systems reviewed and are negative.   Per HPI unless specifically indicated above   Allergies as of 07/31/2018      Reactions   Azithromycin Other (See Comments)   Renal failure   Prednisone Other (See Comments)   Irritability and insomnia   Sulfamethoxazole-trimethoprim Other (See Comments)   Renal failure   Amlodipine    Caused swelling in legs   Ciprofloxacin Itching, Rash   Tape Rash      Medication List       Accurate as of July 31, 2018 11:59 PM. Always use your most recent med list.        acetaminophen 325 MG tablet Commonly known as:  Tylenol Take 2 tablets (650 mg total) by mouth every 6 (six) hours as needed.   albuterol 108 (90 Base) MCG/ACT inhaler Commonly known as:  PROVENTIL HFA;VENTOLIN HFA Inhale 1-2  puffs into the lungs every 6 (six) hours as needed for wheezing or shortness of breath.   carvedilol 6.25 MG tablet Commonly known as:  COREG Take 1 tablet (6.25 mg total) by mouth 2 (two) times daily.   cetirizine 10 MG tablet Commonly known as:  ZYRTEC Take 10 mg by mouth 2 (two) times daily as needed for allergies.   clopidogrel 75 MG tablet Commonly known as:   PLAVIX Take 1 tablet (75 mg total) by mouth daily.   DULoxetine 30 MG capsule Commonly known as:  CYMBALTA Take 2 capsules (60 mg total) by mouth daily.   famotidine 20 MG tablet Commonly known as:  PEPCID Take 1 tablet (20 mg total) by mouth 2 (two) times daily as needed for heartburn or indigestion.   fluticasone 50 MCG/ACT nasal spray Commonly known as:  FLONASE Place 2 sprays into both nostrils daily.   fluticasone furoate-vilanterol 200-25 MCG/INH Aepb Commonly known as:  Breo Ellipta Inhale 1 puff into the lungs daily.   gabapentin 300 MG capsule Commonly known as:  NEURONTIN TAKE 1 CAPSULE BY MOUTH THREE TIMES A DAY   glimepiride 2 MG tablet Commonly known as:  AMARYL TAKE 1 TABLET (2 MG TOTAL) BY MOUTH DAILY BEFORE BREAKFAST.   glucose blood test strip Use as instructed   HYDROcodone-homatropine 5-1.5 MG/5ML syrup Commonly known as:  HYCODAN Take 5 mLs by mouth every 6 (six) hours as needed for cough.   isosorbide mononitrate 30 MG 24 hr tablet Commonly known as:  IMDUR Take 1 tablet (30 mg total) by mouth daily.   latanoprost 0.005 % ophthalmic solution Commonly known as:  XALATAN Place 1 drop into both eyes at bedtime.   Lidocaine 4 % Ptch Apply 1 patch topically 2 (two) times daily.   linaclotide 290 MCG Caps capsule Commonly known as:  Linzess TAKE 1 CAPSULE BY MOUTH DAILY BEFORE BREAKFAST.   lisinopril 20 MG tablet Commonly known as:  PRINIVIL,ZESTRIL Take 1 tablet (20 mg total) by mouth 2 (two) times daily.   montelukast 10 MG tablet Commonly known as:  SINGULAIR 1 tablet daily   nabumetone 500 MG tablet Commonly known as:  RELAFEN Take 1 tablet (500 mg total) by mouth 2 (two) times daily. For muscle and joint pain   naproxen sodium 220 MG tablet Commonly known as:  ALEVE Take 440 mg by mouth 2 (two) times daily as needed (pain).   nitroGLYCERIN 0.4 MG SL tablet Commonly known as:  NITROSTAT Place 1 tablet (0.4 mg total) under the  tongue every 5 (five) minutes as needed.   onetouch ultrasoft lancets Use as instructed   OneTouch Delica Lancets Fine Misc TEST TWICE DAILY   OXYGEN Inhale 3-4 L into the lungs at bedtime.   predniSONE 10 MG tablet Commonly known as:  DELTASONE TAKE 6 TABS DAILY FOR 1 WEEK, THEN 4 TABS DAILY FOR WEEK 2, THEN 2 TABS DAILY FOR WEEK 3.   rosuvastatin 40 MG tablet Commonly known as:  CRESTOR Take 1 tablet (40 mg total) by mouth every evening.   tolterodine 1 MG tablet Commonly known as:  DETROL TAKE 1 TABLET BY MOUTH TWICE A DAY   traMADol 50 MG tablet Commonly known as:  ULTRAM TAKE 1 TABLET EVERY 12 HOURS AS NEEDED FOR MODERATE OR SEVERE PAIN   traZODone 50 MG tablet Commonly known as:  DESYREL Take 1 tablet (50 mg total) by mouth at bedtime.   triamcinolone 0.1 % cream : eucerin Crea Apply 1 application topically  2 (two) times daily.   triamcinolone cream 0.1 % Commonly known as:  KENALOG APPLY TOPICALLY TWO TIMES DAILY          Objective:    BP 140/70   Pulse 86   Temp (!) 97.1 F (36.2 C) (Oral)   Ht _0  (1.651 m)   Wt 201 lb 6.4 oz (91.4 kg)   BMI 33.51 kg/m   Wt Readings from Last 3 Encounters:  07/31/18 201 lb 6.4 oz (91.4 kg)  06/29/18 203 lb 12.8 oz (92.4 kg)  06/26/18 207 lb (93.9 kg)    Physical Exam Vitals signs and nursing note reviewed.  Constitutional:      General: She is not in acute distress.    Appearance: She is well-developed. She is not diaphoretic.  Eyes:     Conjunctiva/sclera: Conjunctivae normal.  Cardiovascular:     Rate and Rhythm: Normal rate and regular rhythm.     Heart sounds: Normal heart sounds. No murmur.  Pulmonary:     Effort: Pulmonary effort is normal. No respiratory distress.     Breath sounds: Normal breath sounds. No wheezing.  Skin:    General: Skin is warm and dry.     Findings: No rash.  Neurological:     Mental Status: She is alert and oriented to person, place, and time.     Coordination:  Coordination normal.  Psychiatric:        Behavior: Behavior normal.        Cognition and Memory: Memory is impaired.         Assessment & Plan:   Problem List Items Addressed This Visit      Cardiovascular and Mediastinum   Essential hypertension   Relevant Medications   carvedilol (COREG) 6.25 MG tablet   lisinopril (PRINIVIL,ZESTRIL) 20 MG tablet   rosuvastatin (CRESTOR) 40 MG tablet   Coronary artery disease   Relevant Medications   carvedilol (COREG) 6.25 MG tablet   lisinopril (PRINIVIL,ZESTRIL) 20 MG tablet   rosuvastatin (CRESTOR) 40 MG tablet     Respiratory   Allergic rhinitis   Relevant Medications   montelukast (SINGULAIR) 10 MG tablet     Digestive   Gastro-esophageal reflux disease with esophagitis   Relevant Medications   famotidine (PEPCID) 20 MG tablet   Other Relevant Orders   CBC with Differential/Platelet (Completed)     Endocrine   Type 2 diabetes mellitus with peripheral neuropathy (HCC) - Primary   Relevant Medications   DULoxetine (CYMBALTA) 30 MG capsule   gabapentin (NEURONTIN) 300 MG capsule   glimepiride (AMARYL) 2 MG tablet   lisinopril (PRINIVIL,ZESTRIL) 20 MG tablet   rosuvastatin (CRESTOR) 40 MG tablet   traZODone (DESYREL) 50 MG tablet   Other Relevant Orders   Bayer DCA Hb A1c Waived (Completed)   CMP14+EGFR (Completed)     Other   Obesity (BMI 30-39.9)   Relevant Medications   glimepiride (AMARYL) 2 MG tablet   Constipation   Relevant Medications   linaclotide (LINZESS) 290 MCG CAPS capsule   Cervical radicular pain   Relevant Orders   ToxASSURE Select 13 (MW), Urine   Fibromyalgia   Relevant Medications   DULoxetine (CYMBALTA) 30 MG capsule   gabapentin (NEURONTIN) 300 MG capsule   traMADol (ULTRAM) 50 MG tablet   traZODone (DESYREL) 50 MG tablet   Chronic pain syndrome   Relevant Medications   DULoxetine (CYMBALTA) 30 MG capsule   gabapentin (NEURONTIN) 300 MG capsule   traMADol (ULTRAM) 50 MG tablet  traZODone (DESYREL) 50 MG tablet   Other Relevant Orders   ToxASSURE Select 13 (MW), Urine   Mixed hyperlipidemia   Relevant Medications   carvedilol (COREG) 6.25 MG tablet   lisinopril (PRINIVIL,ZESTRIL) 20 MG tablet   rosuvastatin (CRESTOR) 40 MG tablet   Other Relevant Orders   Lipid panel (Completed)    Other Visit Diagnoses    Late effects of CVA (cerebrovascular accident)       Relevant Medications   clopidogrel (PLAVIX) 75 MG tablet   Other chronic pain       Relevant Medications   DULoxetine (CYMBALTA) 30 MG capsule   gabapentin (NEURONTIN) 300 MG capsule   traMADol (ULTRAM) 50 MG tablet   traZODone (DESYREL) 50 MG tablet   Insomnia, unspecified type       Relevant Medications   traZODone (DESYREL) 50 MG tablet       Follow up plan: Return in about 3 months (around 10/31/2018), or if symptoms worsen or fail to improve, for Recheck diabetes and chronic medical issues.  Counseling provided for all of the vaccine components Orders Placed This Encounter  Procedures  . Bayer DCA Hb A1c Waived  . CBC with Differential/Platelet  . CMP14+EGFR  . Lipid panel  . ToxASSURE Select 13 (MW), Urine    Caryl Pina, MD [facility] 08/06/2018, 3:12 PM

## 2018-08-01 ENCOUNTER — Telehealth: Payer: Self-pay | Admitting: Family Medicine

## 2018-08-01 LAB — CBC WITH DIFFERENTIAL/PLATELET
BASOS: 0 %
Basophils Absolute: 0 10*3/uL (ref 0.0–0.2)
EOS (ABSOLUTE): 0 10*3/uL (ref 0.0–0.4)
Eos: 0 %
Hematocrit: 39.5 % (ref 34.0–46.6)
Hemoglobin: 12.9 g/dL (ref 11.1–15.9)
Immature Grans (Abs): 0.1 10*3/uL (ref 0.0–0.1)
Immature Granulocytes: 1 %
Lymphocytes Absolute: 0.9 10*3/uL (ref 0.7–3.1)
Lymphs: 7 %
MCH: 28.3 pg (ref 26.6–33.0)
MCHC: 32.7 g/dL (ref 31.5–35.7)
MCV: 87 fL (ref 79–97)
Monocytes Absolute: 0.3 10*3/uL (ref 0.1–0.9)
Monocytes: 3 %
NEUTROS ABS: 10.8 10*3/uL — AB (ref 1.4–7.0)
Neutrophils: 89 %
Platelets: 439 10*3/uL (ref 150–450)
RBC: 4.56 x10E6/uL (ref 3.77–5.28)
RDW: 12.6 % (ref 11.7–15.4)
WBC: 12.1 10*3/uL — ABNORMAL HIGH (ref 3.4–10.8)

## 2018-08-01 LAB — LIPID PANEL
Chol/HDL Ratio: 3.3 ratio (ref 0.0–4.4)
Cholesterol, Total: 197 mg/dL (ref 100–199)
HDL: 60 mg/dL (ref >39)
LDL Calculated: 105 mg/dL — ABNORMAL HIGH (ref 0–99)
Triglycerides: 161 mg/dL — ABNORMAL HIGH (ref 0–149)
VLDL Cholesterol Cal: 32 mg/dL (ref 5–40)

## 2018-08-01 LAB — CMP14+EGFR
A/G RATIO: 1.7 (ref 1.2–2.2)
ALT: 18 IU/L (ref 0–32)
AST: 13 IU/L (ref 0–40)
Albumin: 4 g/dL (ref 3.7–4.7)
Alkaline Phosphatase: 115 IU/L (ref 39–117)
BUN/Creatinine Ratio: 23 (ref 12–28)
BUN: 33 mg/dL — ABNORMAL HIGH (ref 8–27)
Bilirubin Total: 0.3 mg/dL (ref 0.0–1.2)
CO2: 21 mmol/L (ref 20–29)
Calcium: 8.9 mg/dL (ref 8.7–10.3)
Chloride: 96 mmol/L (ref 96–106)
Creatinine, Ser: 1.46 mg/dL — ABNORMAL HIGH (ref 0.57–1.00)
GFR calc Af Amer: 40 mL/min/{1.73_m2} — ABNORMAL LOW (ref >59)
GFR calc non Af Amer: 35 mL/min/{1.73_m2} — ABNORMAL LOW (ref >59)
Globulin, Total: 2.4 g/dL (ref 1.5–4.5)
Glucose: 559 mg/dL (ref 65–99)
Potassium: 5 mmol/L (ref 3.5–5.2)
Sodium: 135 mmol/L (ref 134–144)
Total Protein: 6.4 g/dL (ref 6.0–8.5)

## 2018-08-01 NOTE — Telephone Encounter (Signed)
Okay that is much better than what it was, have her continue the medicine and keep track of it over the next week and let me know what her blood sugars are running over the next week.

## 2018-08-01 NOTE — Telephone Encounter (Signed)
Blood Sugars Readings  4:30pm  245

## 2018-08-02 NOTE — Telephone Encounter (Signed)
Aware. 

## 2018-08-04 ENCOUNTER — Other Ambulatory Visit: Payer: Self-pay | Admitting: Family Medicine

## 2018-08-04 DIAGNOSIS — J441 Chronic obstructive pulmonary disease with (acute) exacerbation: Secondary | ICD-10-CM

## 2018-08-04 NOTE — Telephone Encounter (Signed)
Last seen 07/31/2018 

## 2018-08-07 LAB — TOXASSURE SELECT 13 (MW), URINE

## 2018-08-12 DIAGNOSIS — G9340 Encephalopathy, unspecified: Secondary | ICD-10-CM | POA: Diagnosis not present

## 2018-08-12 DIAGNOSIS — N178 Other acute kidney failure: Secondary | ICD-10-CM | POA: Diagnosis not present

## 2018-08-12 DIAGNOSIS — I517 Cardiomegaly: Secondary | ICD-10-CM | POA: Diagnosis not present

## 2018-08-12 DIAGNOSIS — Z043 Encounter for examination and observation following other accident: Secondary | ICD-10-CM | POA: Diagnosis not present

## 2018-08-12 DIAGNOSIS — R402121 Coma scale, eyes open, to pain, in the field [EMT or ambulance]: Secondary | ICD-10-CM | POA: Diagnosis not present

## 2018-08-12 DIAGNOSIS — I639 Cerebral infarction, unspecified: Secondary | ICD-10-CM | POA: Diagnosis not present

## 2018-08-12 DIAGNOSIS — R402423 Glasgow coma scale score 9-12, at hospital admission: Secondary | ICD-10-CM | POA: Diagnosis not present

## 2018-08-12 DIAGNOSIS — R40242 Glasgow coma scale score 9-12, unspecified time: Secondary | ICD-10-CM | POA: Diagnosis not present

## 2018-08-12 DIAGNOSIS — R4182 Altered mental status, unspecified: Secondary | ICD-10-CM | POA: Diagnosis not present

## 2018-08-12 DIAGNOSIS — G9341 Metabolic encephalopathy: Secondary | ICD-10-CM | POA: Diagnosis not present

## 2018-08-12 DIAGNOSIS — G464 Cerebellar stroke syndrome: Secondary | ICD-10-CM | POA: Diagnosis not present

## 2018-08-12 DIAGNOSIS — R4781 Slurred speech: Secondary | ICD-10-CM | POA: Diagnosis not present

## 2018-08-12 DIAGNOSIS — I10 Essential (primary) hypertension: Secondary | ICD-10-CM | POA: Diagnosis not present

## 2018-08-12 DIAGNOSIS — R402221 Coma scale, best verbal response, incomprehensible words, in the field [EMT or ambulance]: Secondary | ICD-10-CM | POA: Diagnosis not present

## 2018-08-12 DIAGNOSIS — R55 Syncope and collapse: Secondary | ICD-10-CM | POA: Diagnosis not present

## 2018-08-12 DIAGNOSIS — R402351 Coma scale, best motor response, localizes pain, in the field [EMT or ambulance]: Secondary | ICD-10-CM | POA: Diagnosis not present

## 2018-08-12 DIAGNOSIS — I130 Hypertensive heart and chronic kidney disease with heart failure and stage 1 through stage 4 chronic kidney disease, or unspecified chronic kidney disease: Secondary | ICD-10-CM | POA: Diagnosis not present

## 2018-08-12 DIAGNOSIS — W19XXXA Unspecified fall, initial encounter: Secondary | ICD-10-CM | POA: Diagnosis not present

## 2018-08-12 DIAGNOSIS — R4020 Unspecified coma: Secondary | ICD-10-CM | POA: Diagnosis not present

## 2018-08-12 DIAGNOSIS — I420 Dilated cardiomyopathy: Secondary | ICD-10-CM | POA: Diagnosis not present

## 2018-08-12 DIAGNOSIS — G9349 Other encephalopathy: Secondary | ICD-10-CM | POA: Diagnosis not present

## 2018-08-12 DIAGNOSIS — B9620 Unspecified Escherichia coli [E. coli] as the cause of diseases classified elsewhere: Secondary | ICD-10-CM | POA: Diagnosis not present

## 2018-08-12 DIAGNOSIS — X58XXXA Exposure to other specified factors, initial encounter: Secondary | ICD-10-CM | POA: Diagnosis not present

## 2018-08-12 DIAGNOSIS — I2510 Atherosclerotic heart disease of native coronary artery without angina pectoris: Secondary | ICD-10-CM | POA: Diagnosis not present

## 2018-08-12 DIAGNOSIS — N390 Urinary tract infection, site not specified: Secondary | ICD-10-CM | POA: Diagnosis not present

## 2018-08-12 DIAGNOSIS — I5032 Chronic diastolic (congestive) heart failure: Secondary | ICD-10-CM | POA: Diagnosis not present

## 2018-08-12 DIAGNOSIS — J181 Lobar pneumonia, unspecified organism: Secondary | ICD-10-CM | POA: Diagnosis not present

## 2018-08-12 DIAGNOSIS — I6789 Other cerebrovascular disease: Secondary | ICD-10-CM | POA: Diagnosis not present

## 2018-08-12 DIAGNOSIS — I129 Hypertensive chronic kidney disease with stage 1 through stage 4 chronic kidney disease, or unspecified chronic kidney disease: Secondary | ICD-10-CM | POA: Diagnosis not present

## 2018-08-12 DIAGNOSIS — I340 Nonrheumatic mitral (valve) insufficiency: Secondary | ICD-10-CM | POA: Diagnosis not present

## 2018-08-12 DIAGNOSIS — M47812 Spondylosis without myelopathy or radiculopathy, cervical region: Secondary | ICD-10-CM | POA: Diagnosis not present

## 2018-08-12 DIAGNOSIS — N183 Chronic kidney disease, stage 3 (moderate): Secondary | ICD-10-CM | POA: Diagnosis not present

## 2018-08-12 DIAGNOSIS — R9389 Abnormal findings on diagnostic imaging of other specified body structures: Secondary | ICD-10-CM | POA: Diagnosis not present

## 2018-08-13 ENCOUNTER — Other Ambulatory Visit: Payer: Self-pay | Admitting: Pediatrics

## 2018-08-13 DIAGNOSIS — R40242 Glasgow coma scale score 9-12, unspecified time: Secondary | ICD-10-CM | POA: Diagnosis not present

## 2018-08-13 DIAGNOSIS — W19XXXA Unspecified fall, initial encounter: Secondary | ICD-10-CM | POA: Diagnosis not present

## 2018-08-13 DIAGNOSIS — R4781 Slurred speech: Secondary | ICD-10-CM | POA: Diagnosis not present

## 2018-08-13 DIAGNOSIS — R402423 Glasgow coma scale score 9-12, at hospital admission: Secondary | ICD-10-CM | POA: Diagnosis not present

## 2018-08-13 DIAGNOSIS — N178 Other acute kidney failure: Secondary | ICD-10-CM | POA: Diagnosis not present

## 2018-08-13 DIAGNOSIS — R55 Syncope and collapse: Secondary | ICD-10-CM | POA: Diagnosis not present

## 2018-08-19 ENCOUNTER — Inpatient Hospital Stay (HOSPITAL_COMMUNITY)
Admission: EM | Admit: 2018-08-19 | Discharge: 2018-08-26 | DRG: 070 | Disposition: A | Discharge status: Home Health | Payer: Medicare HMO | Attending: Family Medicine | Admitting: Family Medicine

## 2018-08-19 DIAGNOSIS — G894 Chronic pain syndrome: Secondary | ICD-10-CM | POA: Diagnosis present

## 2018-08-19 DIAGNOSIS — I69322 Dysarthria following cerebral infarction: Secondary | ICD-10-CM

## 2018-08-19 DIAGNOSIS — K2100 Gastro-esophageal reflux disease with esophagitis, without bleeding: Secondary | ICD-10-CM

## 2018-08-19 DIAGNOSIS — K219 Gastro-esophageal reflux disease without esophagitis: Secondary | ICD-10-CM | POA: Diagnosis present

## 2018-08-19 DIAGNOSIS — I69319 Unspecified symptoms and signs involving cognitive functions following cerebral infarction: Secondary | ICD-10-CM

## 2018-08-19 DIAGNOSIS — R000 Tachycardia, unspecified: Secondary | ICD-10-CM | POA: Diagnosis not present

## 2018-08-19 DIAGNOSIS — R0689 Other abnormalities of breathing: Secondary | ICD-10-CM | POA: Diagnosis not present

## 2018-08-19 DIAGNOSIS — Z7902 Long term (current) use of antithrombotics/antiplatelets: Secondary | ICD-10-CM

## 2018-08-19 DIAGNOSIS — R402244 Coma scale, best verbal response, confused conversation, 24 hours or more after hospital admission: Secondary | ICD-10-CM | POA: Diagnosis not present

## 2018-08-19 DIAGNOSIS — G20 Parkinson's disease: Secondary | ICD-10-CM | POA: Diagnosis present

## 2018-08-19 DIAGNOSIS — E876 Hypokalemia: Secondary | ICD-10-CM | POA: Diagnosis not present

## 2018-08-19 DIAGNOSIS — T39395A Adverse effect of other nonsteroidal anti-inflammatory drugs [NSAID], initial encounter: Secondary | ICD-10-CM | POA: Diagnosis present

## 2018-08-19 DIAGNOSIS — Z881 Allergy status to other antibiotic agents status: Secondary | ICD-10-CM

## 2018-08-19 DIAGNOSIS — G9341 Metabolic encephalopathy: Principal | ICD-10-CM | POA: Diagnosis present

## 2018-08-19 DIAGNOSIS — F329 Major depressive disorder, single episode, unspecified: Secondary | ICD-10-CM | POA: Diagnosis present

## 2018-08-19 DIAGNOSIS — T68XXXA Hypothermia, initial encounter: Secondary | ICD-10-CM | POA: Diagnosis present

## 2018-08-19 DIAGNOSIS — Z808 Family history of malignant neoplasm of other organs or systems: Secondary | ICD-10-CM

## 2018-08-19 DIAGNOSIS — N179 Acute kidney failure, unspecified: Secondary | ICD-10-CM

## 2018-08-19 DIAGNOSIS — R4182 Altered mental status, unspecified: Secondary | ICD-10-CM | POA: Diagnosis not present

## 2018-08-19 DIAGNOSIS — E782 Mixed hyperlipidemia: Secondary | ICD-10-CM | POA: Diagnosis present

## 2018-08-19 DIAGNOSIS — R404 Transient alteration of awareness: Secondary | ICD-10-CM | POA: Diagnosis not present

## 2018-08-19 DIAGNOSIS — Z8261 Family history of arthritis: Secondary | ICD-10-CM

## 2018-08-19 DIAGNOSIS — Z66 Do not resuscitate: Secondary | ICD-10-CM | POA: Diagnosis present

## 2018-08-19 DIAGNOSIS — E1142 Type 2 diabetes mellitus with diabetic polyneuropathy: Secondary | ICD-10-CM | POA: Diagnosis present

## 2018-08-19 DIAGNOSIS — J189 Pneumonia, unspecified organism: Secondary | ICD-10-CM | POA: Diagnosis present

## 2018-08-19 DIAGNOSIS — M797 Fibromyalgia: Secondary | ICD-10-CM | POA: Diagnosis present

## 2018-08-19 DIAGNOSIS — N289 Disorder of kidney and ureter, unspecified: Secondary | ICD-10-CM

## 2018-08-19 DIAGNOSIS — Z9181 History of falling: Secondary | ICD-10-CM

## 2018-08-19 DIAGNOSIS — G4700 Insomnia, unspecified: Secondary | ICD-10-CM

## 2018-08-19 DIAGNOSIS — Z91048 Other nonmedicinal substance allergy status: Secondary | ICD-10-CM

## 2018-08-19 DIAGNOSIS — I110 Hypertensive heart disease with heart failure: Secondary | ICD-10-CM | POA: Diagnosis present

## 2018-08-19 DIAGNOSIS — Z8701 Personal history of pneumonia (recurrent): Secondary | ICD-10-CM

## 2018-08-19 DIAGNOSIS — Z6833 Body mass index (BMI) 33.0-33.9, adult: Secondary | ICD-10-CM

## 2018-08-19 DIAGNOSIS — Z7984 Long term (current) use of oral hypoglycemic drugs: Secondary | ICD-10-CM

## 2018-08-19 DIAGNOSIS — I2510 Atherosclerotic heart disease of native coronary artery without angina pectoris: Secondary | ICD-10-CM | POA: Diagnosis present

## 2018-08-19 DIAGNOSIS — R402144 Coma scale, eyes open, spontaneous, 24 hours or more after hospital admission: Secondary | ICD-10-CM | POA: Diagnosis not present

## 2018-08-19 DIAGNOSIS — N170 Acute kidney failure with tubular necrosis: Secondary | ICD-10-CM | POA: Diagnosis present

## 2018-08-19 DIAGNOSIS — R4020 Unspecified coma: Secondary | ICD-10-CM | POA: Diagnosis not present

## 2018-08-19 DIAGNOSIS — R296 Repeated falls: Secondary | ICD-10-CM | POA: Diagnosis present

## 2018-08-19 DIAGNOSIS — R55 Syncope and collapse: Secondary | ICD-10-CM | POA: Diagnosis not present

## 2018-08-19 DIAGNOSIS — R0902 Hypoxemia: Secondary | ICD-10-CM | POA: Diagnosis not present

## 2018-08-19 DIAGNOSIS — R34 Anuria and oliguria: Secondary | ICD-10-CM | POA: Diagnosis not present

## 2018-08-19 DIAGNOSIS — R627 Adult failure to thrive: Secondary | ICD-10-CM | POA: Diagnosis present

## 2018-08-19 DIAGNOSIS — Z882 Allergy status to sulfonamides status: Secondary | ICD-10-CM

## 2018-08-19 DIAGNOSIS — J3089 Other allergic rhinitis: Secondary | ICD-10-CM

## 2018-08-19 DIAGNOSIS — I639 Cerebral infarction, unspecified: Secondary | ICD-10-CM | POA: Diagnosis present

## 2018-08-19 DIAGNOSIS — I69320 Aphasia following cerebral infarction: Secondary | ICD-10-CM

## 2018-08-19 DIAGNOSIS — N390 Urinary tract infection, site not specified: Secondary | ICD-10-CM | POA: Diagnosis present

## 2018-08-19 DIAGNOSIS — Z8249 Family history of ischemic heart disease and other diseases of the circulatory system: Secondary | ICD-10-CM

## 2018-08-19 DIAGNOSIS — R4189 Other symptoms and signs involving cognitive functions and awareness: Secondary | ICD-10-CM

## 2018-08-19 DIAGNOSIS — I679 Cerebrovascular disease, unspecified: Secondary | ICD-10-CM | POA: Diagnosis present

## 2018-08-19 DIAGNOSIS — R918 Other nonspecific abnormal finding of lung field: Secondary | ICD-10-CM | POA: Diagnosis present

## 2018-08-19 DIAGNOSIS — Z79899 Other long term (current) drug therapy: Secondary | ICD-10-CM

## 2018-08-19 DIAGNOSIS — I5022 Chronic systolic (congestive) heart failure: Secondary | ICD-10-CM | POA: Diagnosis present

## 2018-08-19 DIAGNOSIS — Z888 Allergy status to other drugs, medicaments and biological substances status: Secondary | ICD-10-CM

## 2018-08-19 DIAGNOSIS — E1169 Type 2 diabetes mellitus with other specified complication: Secondary | ICD-10-CM | POA: Diagnosis present

## 2018-08-19 DIAGNOSIS — Z7401 Bed confinement status: Secondary | ICD-10-CM

## 2018-08-19 DIAGNOSIS — G9340 Encephalopathy, unspecified: Secondary | ICD-10-CM | POA: Diagnosis present

## 2018-08-19 DIAGNOSIS — J449 Chronic obstructive pulmonary disease, unspecified: Secondary | ICD-10-CM | POA: Diagnosis present

## 2018-08-19 DIAGNOSIS — Z9981 Dependence on supplemental oxygen: Secondary | ICD-10-CM

## 2018-08-19 DIAGNOSIS — Z7982 Long term (current) use of aspirin: Secondary | ICD-10-CM

## 2018-08-19 DIAGNOSIS — R402344 Coma scale, best motor response, flexion withdrawal, 24 hours or more after hospital admission: Secondary | ICD-10-CM | POA: Diagnosis not present

## 2018-08-19 DIAGNOSIS — J440 Chronic obstructive pulmonary disease with acute lower respiratory infection: Secondary | ICD-10-CM | POA: Diagnosis present

## 2018-08-19 DIAGNOSIS — I10 Essential (primary) hypertension: Secondary | ICD-10-CM | POA: Diagnosis present

## 2018-08-19 DIAGNOSIS — R569 Unspecified convulsions: Secondary | ICD-10-CM | POA: Diagnosis not present

## 2018-08-19 DIAGNOSIS — Z981 Arthrodesis status: Secondary | ICD-10-CM

## 2018-08-19 NOTE — ED Triage Notes (Signed)
Patient brought in by EMS for initially unresponsiveness. Patient will open her eyes to verbal stimuli. Patient just got out of [facility] 2 days ago for a blood infection. Patient pale in color. EMS found patient lying on the floor. Patient has past history of TIA's and a CVA.

## 2018-08-20 ENCOUNTER — Emergency Department (HOSPITAL_COMMUNITY): Payer: Medicare HMO

## 2018-08-20 ENCOUNTER — Encounter (HOSPITAL_COMMUNITY): Payer: Self-pay | Admitting: Emergency Medicine

## 2018-08-20 ENCOUNTER — Other Ambulatory Visit: Payer: Self-pay

## 2018-08-20 DIAGNOSIS — R296 Repeated falls: Secondary | ICD-10-CM | POA: Diagnosis not present

## 2018-08-20 DIAGNOSIS — Z981 Arthrodesis status: Secondary | ICD-10-CM | POA: Diagnosis not present

## 2018-08-20 DIAGNOSIS — R4189 Other symptoms and signs involving cognitive functions and awareness: Secondary | ICD-10-CM | POA: Diagnosis not present

## 2018-08-20 DIAGNOSIS — Z7902 Long term (current) use of antithrombotics/antiplatelets: Secondary | ICD-10-CM | POA: Diagnosis not present

## 2018-08-20 DIAGNOSIS — Z79899 Other long term (current) drug therapy: Secondary | ICD-10-CM | POA: Diagnosis not present

## 2018-08-20 DIAGNOSIS — I69320 Aphasia following cerebral infarction: Secondary | ICD-10-CM | POA: Diagnosis not present

## 2018-08-20 DIAGNOSIS — E782 Mixed hyperlipidemia: Secondary | ICD-10-CM | POA: Diagnosis not present

## 2018-08-20 DIAGNOSIS — E1142 Type 2 diabetes mellitus with diabetic polyneuropathy: Secondary | ICD-10-CM | POA: Diagnosis not present

## 2018-08-20 DIAGNOSIS — G9341 Metabolic encephalopathy: Secondary | ICD-10-CM | POA: Diagnosis not present

## 2018-08-20 DIAGNOSIS — R918 Other nonspecific abnormal finding of lung field: Secondary | ICD-10-CM | POA: Diagnosis not present

## 2018-08-20 DIAGNOSIS — I69322 Dysarthria following cerebral infarction: Secondary | ICD-10-CM | POA: Diagnosis not present

## 2018-08-20 DIAGNOSIS — N289 Disorder of kidney and ureter, unspecified: Secondary | ICD-10-CM

## 2018-08-20 DIAGNOSIS — I639 Cerebral infarction, unspecified: Secondary | ICD-10-CM | POA: Diagnosis not present

## 2018-08-20 DIAGNOSIS — J410 Simple chronic bronchitis: Secondary | ICD-10-CM | POA: Diagnosis not present

## 2018-08-20 DIAGNOSIS — Z9181 History of falling: Secondary | ICD-10-CM | POA: Diagnosis not present

## 2018-08-20 DIAGNOSIS — I10 Essential (primary) hypertension: Secondary | ICD-10-CM | POA: Diagnosis not present

## 2018-08-20 DIAGNOSIS — G894 Chronic pain syndrome: Secondary | ICD-10-CM | POA: Diagnosis present

## 2018-08-20 DIAGNOSIS — Z7984 Long term (current) use of oral hypoglycemic drugs: Secondary | ICD-10-CM | POA: Diagnosis not present

## 2018-08-20 DIAGNOSIS — J440 Chronic obstructive pulmonary disease with acute lower respiratory infection: Secondary | ICD-10-CM | POA: Diagnosis not present

## 2018-08-20 DIAGNOSIS — N390 Urinary tract infection, site not specified: Secondary | ICD-10-CM | POA: Diagnosis not present

## 2018-08-20 DIAGNOSIS — J189 Pneumonia, unspecified organism: Secondary | ICD-10-CM | POA: Diagnosis not present

## 2018-08-20 DIAGNOSIS — N170 Acute kidney failure with tubular necrosis: Secondary | ICD-10-CM | POA: Diagnosis not present

## 2018-08-20 DIAGNOSIS — Z808 Family history of malignant neoplasm of other organs or systems: Secondary | ICD-10-CM | POA: Diagnosis not present

## 2018-08-20 DIAGNOSIS — E1129 Type 2 diabetes mellitus with other diabetic kidney complication: Secondary | ICD-10-CM | POA: Diagnosis not present

## 2018-08-20 DIAGNOSIS — M797 Fibromyalgia: Secondary | ICD-10-CM | POA: Diagnosis present

## 2018-08-20 DIAGNOSIS — N179 Acute kidney failure, unspecified: Secondary | ICD-10-CM | POA: Diagnosis not present

## 2018-08-20 DIAGNOSIS — R464 Slowness and poor responsiveness: Secondary | ICD-10-CM | POA: Diagnosis not present

## 2018-08-20 DIAGNOSIS — I5022 Chronic systolic (congestive) heart failure: Secondary | ICD-10-CM | POA: Diagnosis not present

## 2018-08-20 DIAGNOSIS — Z8249 Family history of ischemic heart disease and other diseases of the circulatory system: Secondary | ICD-10-CM | POA: Diagnosis not present

## 2018-08-20 DIAGNOSIS — R4182 Altered mental status, unspecified: Secondary | ICD-10-CM | POA: Diagnosis present

## 2018-08-20 DIAGNOSIS — T68XXXA Hypothermia, initial encounter: Secondary | ICD-10-CM | POA: Diagnosis present

## 2018-08-20 DIAGNOSIS — R402344 Coma scale, best motor response, flexion withdrawal, 24 hours or more after hospital admission: Secondary | ICD-10-CM | POA: Diagnosis not present

## 2018-08-20 DIAGNOSIS — Z66 Do not resuscitate: Secondary | ICD-10-CM | POA: Diagnosis not present

## 2018-08-20 DIAGNOSIS — E785 Hyperlipidemia, unspecified: Secondary | ICD-10-CM | POA: Diagnosis not present

## 2018-08-20 DIAGNOSIS — Z8261 Family history of arthritis: Secondary | ICD-10-CM | POA: Diagnosis not present

## 2018-08-20 DIAGNOSIS — I2510 Atherosclerotic heart disease of native coronary artery without angina pectoris: Secondary | ICD-10-CM | POA: Diagnosis present

## 2018-08-20 LAB — BASIC METABOLIC PANEL
Anion gap: 12 (ref 5–15)
BUN: 42 mg/dL — ABNORMAL HIGH (ref 8–23)
CO2: 21 mmol/L — ABNORMAL LOW (ref 22–32)
Calcium: 8.3 mg/dL — ABNORMAL LOW (ref 8.9–10.3)
Chloride: 104 mmol/L (ref 98–111)
Creatinine, Ser: 5.23 mg/dL — ABNORMAL HIGH (ref 0.44–1.00)
GFR calc Af Amer: 9 mL/min — ABNORMAL LOW (ref >60)
GFR calc non Af Amer: 7 mL/min — ABNORMAL LOW (ref >60)
Glucose, Bld: 69 mg/dL — ABNORMAL LOW (ref 70–99)
Potassium: 3.5 mmol/L (ref 3.5–5.1)
Sodium: 137 mmol/L (ref 135–145)

## 2018-08-20 LAB — COMPREHENSIVE METABOLIC PANEL
ALT: 27 U/L (ref 0–44)
AST: 26 U/L (ref 15–41)
Albumin: 3.4 g/dL — ABNORMAL LOW (ref 3.5–5.0)
Alkaline Phosphatase: 85 U/L (ref 38–126)
Anion gap: 14 (ref 5–15)
BUN: 37 mg/dL — ABNORMAL HIGH (ref 8–23)
CO2: 22 mmol/L (ref 22–32)
Calcium: 9 mg/dL (ref 8.9–10.3)
Chloride: 99 mmol/L (ref 98–111)
Creatinine, Ser: 4.61 mg/dL — ABNORMAL HIGH (ref 0.44–1.00)
GFR calc Af Amer: 10 mL/min — ABNORMAL LOW (ref >60)
GFR calc non Af Amer: 9 mL/min — ABNORMAL LOW (ref >60)
Glucose, Bld: 182 mg/dL — ABNORMAL HIGH (ref 70–99)
Potassium: 3.9 mmol/L (ref 3.5–5.1)
Sodium: 135 mmol/L (ref 135–145)
Total Bilirubin: 0.9 mg/dL (ref 0.3–1.2)
Total Protein: 6.8 g/dL (ref 6.5–8.1)

## 2018-08-20 LAB — RAPID URINE DRUG SCREEN, HOSP PERFORMED
Amphetamines: NOT DETECTED
Barbiturates: NOT DETECTED
Benzodiazepines: NOT DETECTED
Cocaine: NOT DETECTED
Opiates: NOT DETECTED
Tetrahydrocannabinol: NOT DETECTED

## 2018-08-20 LAB — BLOOD GAS, VENOUS
Acid-base deficit: 1.5 mmol/L (ref 0.0–2.0)
Bicarbonate: 20 mmol/L (ref 20.0–28.0)
FIO2: 32
O2 Saturation: 33.1 %
Patient temperature: 37
pCO2, Ven: 55.9 mmHg (ref 44.0–60.0)
pH, Ven: 7.265 (ref 7.250–7.430)
pO2, Ven: <31 mmHg — CL (ref 32.0–45.0)

## 2018-08-20 LAB — CBC
HCT: 37.9 % (ref 36.0–46.0)
Hemoglobin: 12.1 g/dL (ref 12.0–15.0)
MCH: 28.4 pg (ref 26.0–34.0)
MCHC: 31.9 g/dL (ref 30.0–36.0)
MCV: 89 fL (ref 80.0–100.0)
Platelets: 229 10*3/uL (ref 150–400)
RBC: 4.26 MIL/uL (ref 3.87–5.11)
RDW: 14 % (ref 11.5–15.5)
WBC: 6.8 10*3/uL (ref 4.0–10.5)
nRBC: 0 % (ref 0.0–0.2)

## 2018-08-20 LAB — DIFFERENTIAL
Abs Immature Granulocytes: 0.05 10*3/uL (ref 0.00–0.07)
Basophils Absolute: 0.1 10*3/uL (ref 0.0–0.1)
Basophils Relative: 1 %
Eosinophils Absolute: 0.3 10*3/uL (ref 0.0–0.5)
Eosinophils Relative: 5 %
Immature Granulocytes: 1 %
Lymphocytes Relative: 16 %
Lymphs Abs: 1.1 10*3/uL (ref 0.7–4.0)
Monocytes Absolute: 1 10*3/uL (ref 0.1–1.0)
Monocytes Relative: 14 %
Neutro Abs: 4.3 10*3/uL (ref 1.7–7.7)
Neutrophils Relative %: 63 %

## 2018-08-20 LAB — GLUCOSE, CAPILLARY
Glucose-Capillary: 128 mg/dL — ABNORMAL HIGH (ref 70–99)
Glucose-Capillary: 81 mg/dL (ref 70–99)
Glucose-Capillary: 46 mg/dL — ABNORMAL LOW (ref 70–99)
Glucose-Capillary: 135 mg/dL — ABNORMAL HIGH (ref 70–99)
Glucose-Capillary: 109 mg/dL — ABNORMAL HIGH (ref 70–99)

## 2018-08-20 LAB — URINALYSIS, ROUTINE W REFLEX MICROSCOPIC
Bilirubin Urine: NEGATIVE
Glucose, UA: NEGATIVE mg/dL
Ketones, ur: 5 mg/dL — AB
Leukocytes,Ua: NEGATIVE
Nitrite: NEGATIVE
Protein, ur: 100 mg/dL — AB
Specific Gravity, Urine: 1.026 (ref 1.005–1.030)
pH: 5 (ref 5.0–8.0)

## 2018-08-20 LAB — TSH: TSH: 3.283 u[IU]/mL (ref 0.350–4.500)

## 2018-08-20 LAB — PROTIME-INR
INR: 1 (ref 0.8–1.2)
Prothrombin Time: 13.5 seconds (ref 11.4–15.2)

## 2018-08-20 LAB — HEMOGLOBIN A1C
Hgb A1c MFr Bld: 9.8 % — ABNORMAL HIGH (ref 4.8–5.6)
Mean Plasma Glucose: 234.56 mg/dL

## 2018-08-20 LAB — LACTIC ACID, PLASMA
Lactic Acid, Venous: 1.6 mmol/L (ref 0.5–1.9)
Lactic Acid, Venous: 1.6 mmol/L (ref 0.5–1.9)

## 2018-08-20 LAB — MRSA PCR SCREENING: MRSA by PCR: POSITIVE — AB

## 2018-08-20 LAB — APTT: aPTT: 28 seconds (ref 24–36)

## 2018-08-20 LAB — ETHANOL: Alcohol, Ethyl (B): <10 mg/dL (ref <10)

## 2018-08-20 LAB — CK: Total CK: 182 U/L (ref 38–234)

## 2018-08-20 LAB — CBG MONITORING, ED: Glucose-Capillary: 165 mg/dL — ABNORMAL HIGH (ref 70–99)

## 2018-08-20 MED ORDER — DEXTROSE 50 % IV SOLN
25.00 | INTRAVENOUS | Status: DC
Start: ? — End: 2018-08-20

## 2018-08-20 MED ORDER — HYDRALAZINE HCL 25 MG PO TABS
25.00 | ORAL_TABLET | ORAL | Status: DC
Start: ? — End: 2018-08-20

## 2018-08-20 MED ORDER — SENNOSIDES-DOCUSATE SODIUM 8.6-50 MG PO TABS
2.00 | ORAL_TABLET | ORAL | Status: DC
Start: 2018-08-17 — End: 2018-08-20

## 2018-08-20 MED ORDER — SODIUM CHLORIDE 0.9 % IV SOLN
INTRAVENOUS | Status: DC
  Administered 2018-08-20: 07:00:00 via INTRAVENOUS

## 2018-08-20 MED ORDER — HEPARIN SODIUM (PORCINE) 5000 UNIT/ML IJ SOLN
5000.0000 [IU] | Freq: Three times a day (TID) | INTRAMUSCULAR | Status: DC
  Administered 2018-08-20 – 2018-08-26 (×19): 5000 [IU] via SUBCUTANEOUS
  Filled 2018-08-20 (×18): qty 1

## 2018-08-20 MED ORDER — NITROGLYCERIN 0.4 MG SL SUBL: 0.4000 mg | SUBLINGUAL_TABLET | SUBLINGUAL | Status: DC | PRN

## 2018-08-20 MED ORDER — ROSUVASTATIN CALCIUM 10 MG PO TABS: 10.0000 mg | ORAL_TABLET | Freq: Once | ORAL | Status: DC

## 2018-08-20 MED ORDER — BUDESONIDE-FORMOTEROL FUMARATE 80-4.5 MCG/ACT IN AERO
2.00 | INHALATION_SPRAY | RESPIRATORY_TRACT | Status: DC
Start: 2018-08-17 — End: 2018-08-20

## 2018-08-20 MED ORDER — QUINERVA 260 MG PO TABS
650.00 | ORAL_TABLET | ORAL | Status: DC
Start: ? — End: 2018-08-20

## 2018-08-20 MED ORDER — MUPIROCIN CALCIUM 2 % EX CREA
TOPICAL_CREAM | Freq: Two times a day (BID) | CUTANEOUS | Status: DC
  Filled 2018-08-20: qty 15

## 2018-08-20 MED ORDER — ACETAMINOPHEN 650 MG RE SUPP: 650.0000 mg | Freq: Four times a day (QID) | RECTAL | Status: DC | PRN

## 2018-08-20 MED ORDER — ASPIRIN 81 MG PO CHEW
81.0000 mg | CHEWABLE_TABLET | Freq: Every day | ORAL | Status: DC
  Filled 2018-08-20: qty 1

## 2018-08-20 MED ORDER — CLOPIDOGREL BISULFATE 75 MG PO TABS
75.00 | ORAL_TABLET | ORAL | Status: DC
Start: 2018-08-18 — End: 2018-08-20

## 2018-08-20 MED ORDER — CHLORHEXIDINE GLUCONATE CLOTH 2 % EX PADS
6.0000 | MEDICATED_PAD | Freq: Every day | CUTANEOUS | Status: DC
  Administered 2018-08-20 – 2018-08-26 (×7): 6 via TOPICAL

## 2018-08-20 MED ORDER — GENERIC EXTERNAL MEDICATION
2.00 | Status: DC
Start: 2018-08-17 — End: 2018-08-20

## 2018-08-20 MED ORDER — ASPIRIN EC 81 MG PO TBEC
81.00 | DELAYED_RELEASE_TABLET | ORAL | Status: DC
Start: 2018-08-18 — End: 2018-08-20

## 2018-08-20 MED ORDER — ACETAMINOPHEN 325 MG PO TABS: 650.0000 mg | ORAL_TABLET | Freq: Four times a day (QID) | ORAL | Status: DC | PRN

## 2018-08-20 MED ORDER — SODIUM CHLORIDE 0.9 % IV BOLUS
1000.0000 mL | Freq: Once | INTRAVENOUS | Status: AC
  Administered 2018-08-20: 02:00:00 1000 mL via INTRAVENOUS

## 2018-08-20 MED ORDER — ONDANSETRON 4 MG PO TBDP
4.00 | ORAL_TABLET | ORAL | Status: DC
Start: ? — End: 2018-08-20

## 2018-08-20 MED ORDER — ALUMINUM-MAGNESIUM-SIMETHICONE 200-200-20 MG/5ML PO SUSP
20.00 | ORAL | Status: DC
Start: ? — End: 2018-08-20

## 2018-08-20 MED ORDER — PIPERACILLIN-TAZOBACTAM 3.375 G IVPB
3.3750 g | Freq: Two times a day (BID) | INTRAVENOUS | Status: DC
  Administered 2018-08-20 – 2018-08-21 (×3): 3.375 g via INTRAVENOUS
  Filled 2018-08-20 (×3): qty 50

## 2018-08-20 MED ORDER — CLOPIDOGREL BISULFATE 75 MG PO TABS
75.0000 mg | ORAL_TABLET | Freq: Every day | ORAL | Status: DC
  Administered 2018-08-22 – 2018-08-26 (×4): 75 mg via ORAL
  Filled 2018-08-20 (×7): qty 1

## 2018-08-20 MED ORDER — LATANOPROST 0.005 % OP SOLN
1.0000 [drp] | Freq: Every day | OPHTHALMIC | Status: DC
  Administered 2018-08-20 – 2018-08-25 (×6): 1 [drp] via OPHTHALMIC
  Filled 2018-08-20: qty 2.5

## 2018-08-20 MED ORDER — CARVEDILOL 6.25 MG PO TABS
6.25 | ORAL_TABLET | ORAL | Status: DC
Start: 2018-08-17 — End: 2018-08-20

## 2018-08-20 MED ORDER — INSULIN LISPRO 100 UNIT/ML ~~LOC~~ SOLN
0.00 | SUBCUTANEOUS | Status: DC
Start: 2018-08-17 — End: 2018-08-20

## 2018-08-20 MED ORDER — FAMOTIDINE 20 MG PO TABS
20.00 | ORAL_TABLET | ORAL | Status: DC
Start: 2018-08-18 — End: 2018-08-20

## 2018-08-20 MED ORDER — ENOXAPARIN SODIUM 40 MG/0.4ML ~~LOC~~ SOLN
40.00 | SUBCUTANEOUS | Status: DC
Start: 2018-08-18 — End: 2018-08-20

## 2018-08-20 MED ORDER — ROSUVASTATIN CALCIUM 20 MG PO TABS
40.00 | ORAL_TABLET | ORAL | Status: DC
Start: 2018-08-17 — End: 2018-08-20

## 2018-08-20 MED ORDER — DEXTROSE 50 % IV SOLN
INTRAVENOUS | Status: AC
  Administered 2018-08-20: 17:00:00 50 mL
  Filled 2018-08-20: qty 50

## 2018-08-20 MED ORDER — MUPIROCIN 2 % EX OINT
TOPICAL_OINTMENT | Freq: Two times a day (BID) | CUTANEOUS | Status: DC
  Administered 2018-08-20 – 2018-08-26 (×12): via NASAL
  Filled 2018-08-20: qty 22

## 2018-08-20 MED ORDER — MONTELUKAST SODIUM 5 MG PO CHEW
10.00 | CHEWABLE_TABLET | ORAL | Status: DC
Start: 2018-08-17 — End: 2018-08-20

## 2018-08-20 MED ORDER — ROSUVASTATIN CALCIUM 20 MG PO TABS: 40.0000 mg | ORAL_TABLET | Freq: Every evening | ORAL | Status: DC

## 2018-08-20 MED ORDER — ONDANSETRON HCL 4 MG PO TABS: 4.0000 mg | ORAL_TABLET | Freq: Four times a day (QID) | ORAL | Status: DC | PRN

## 2018-08-20 MED ORDER — ONDANSETRON HCL 4 MG/2ML IJ SOLN: 4.0000 mg | Freq: Four times a day (QID) | INTRAMUSCULAR | Status: DC | PRN

## 2018-08-20 MED ORDER — LACTULOSE 10 GM/15ML PO SOLN
20.00 | ORAL | Status: DC
Start: 2018-08-17 — End: 2018-08-20

## 2018-08-20 MED ORDER — INSULIN ASPART 100 UNIT/ML ~~LOC~~ SOLN
0.0000 [IU] | Freq: Four times a day (QID) | SUBCUTANEOUS | Status: DC
  Administered 2018-08-20 – 2018-08-21 (×2): 1 [IU] via SUBCUTANEOUS
  Administered 2018-08-23: 2 [IU] via SUBCUTANEOUS
  Administered 2018-08-23: 12:00:00 1 [IU] via SUBCUTANEOUS
  Administered 2018-08-23: 06:00:00 2 [IU] via SUBCUTANEOUS
  Administered 2018-08-24: 05:00:00 1 [IU] via SUBCUTANEOUS
  Administered 2018-08-25: 12:00:00 2 [IU] via SUBCUTANEOUS

## 2018-08-20 MED ORDER — SODIUM CHLORIDE 0.45 % IV BOLUS
1000.0000 mL | Freq: Once | INTRAVENOUS | Status: AC
  Administered 2018-08-20: 1000 mL via INTRAVENOUS

## 2018-08-20 MED ORDER — NITROGLYCERIN 0.4 MG SL SUBL
.40 | SUBLINGUAL_TABLET | SUBLINGUAL | Status: DC
Start: ? — End: 2018-08-20

## 2018-08-20 MED ORDER — PIPERACILLIN-TAZOBACTAM 3.375 G IVPB 30 MIN
3.3750 g | Freq: Once | INTRAVENOUS | Status: AC
  Administered 2018-08-20: 05:00:00 3.375 g via INTRAVENOUS
  Filled 2018-08-20: qty 50

## 2018-08-20 MED ORDER — DEXTROSE-NACL 5-0.45 % IV SOLN
INTRAVENOUS | Status: DC
  Administered 2018-08-20 – 2018-08-24 (×8): via INTRAVENOUS

## 2018-08-20 MED ORDER — ASPIRIN 300 MG RE SUPP
300.0000 mg | Freq: Every day | RECTAL | Status: DC
  Administered 2018-08-20 – 2018-08-21 (×2): 300 mg via RECTAL
  Filled 2018-08-20 (×2): qty 1

## 2018-08-20 NOTE — ED Notes (Signed)
Patient tried to speak but was unable to speak clearly.

## 2018-08-20 NOTE — ED Notes (Signed)
If need to contact spouse: Ellorie Kindall, ph [phone]

## 2018-08-20 NOTE — Progress Notes (Signed)
Patient arrived to floor via bed. Patient is alert and able to respond with moans and one words. Patients VSS and glucose is rechecked and is 135.

## 2018-08-20 NOTE — Progress Notes (Signed)
Pharmacy Antibiotic Note  Susan Patel is a 78 y.o. female admitted on 08/19/2018 with sepsis.  Pharmacy has been consulted for Zosyn dosing.  Plan: Zosyn 3.375g IV every 12 hours. Monitor labs, c/s, and patient improvement.  Height: 5\' 5"  (165.1 cm) Weight: 196 lb 6.9 oz (89.1 kg) IBW/kg (Calculated) : 57  Temp (24hrs), Avg:97.9 F (36.6 C), Min:95.7 F (35.4 C), Max:99.1 F (37.3 C)  Recent Labs  Lab 08/20/18 0023 08/20/18 0320  WBC 6.8  --   CREATININE 4.61*  --   LATICACIDVEN 1.6 1.6    Estimated Creatinine Clearance: 11.6 mL/min (A) (by C-G formula based on SCr of 4.61 mg/dL (H)).    Allergies  Allergen Reactions  . Azithromycin Other (See Comments)    Renal failure  . Prednisone Other (See Comments)    Irritability and insomnia  . Sulfamethoxazole-Trimethoprim Other (See Comments)    Renal failure  . Amlodipine     Caused swelling in legs  . Ciprofloxacin Itching and Rash  . Tape Rash    Antimicrobials this admission: Zosyn 4/5 >>     Dose adjustments this admission: Zosyn  Microbiology results: 4/5 BCx: pending 4/5 UCx: pending   4/5 MRSA PCR: pending  Thank you for allowing pharmacy to be a part of this patient's care.  Ramond Craver 08/20/2018 8:04 AM

## 2018-08-20 NOTE — Progress Notes (Signed)
Patient admitted to the hospital earlier this morning by Dr. Darrick Meigs.  Patient seen and examined.  She does respond to voice.  Speech is difficult to comprehend.  She did tell the staff that she knows she is in the hospital, but does not appear to be mentally clear at this time.  She does not have any gross focal neurologic deficits.  Patient was admitted to the hospital with alteration in mental status.  She was recently discharged from hospital at [facility] after being evaluated for acute cerebral infarct.  At that time, skilled nurse facility placement was recommended, but patient's husband elected to take her home.  Patient had been essentially bedbound since her discharge.  She was brought back to the hospital with increasing lethargy and dysarthria.  Initial CT scan of the head was unrevealing.  Further work-up including basic chemistry showed that she has a new acute kidney injury.  She is on several medications which are renally cleared and could also be contributing to confusion.  CK level was checked and found to be normal.  Renal ultrasound has been requested.  Urine output has been poor.  Will continue on IV fluids.  She was taking ACE inhibitor's and nonsteroidal anti-inflammatories which likely contributed to her renal failure.  If renal function fails to improve by tomorrow, can consider nephrology input.  She will also undergo MRI brain in a.m. to evaluate for any acute neurologic process  [facility]

## 2018-08-20 NOTE — ED Notes (Signed)
Last know normal per patient's husband states around 11 pm. Patient's husband states he had went outside to do some errands and came back in the house to find the patient lying in the floor. Husband states that patient was her normal talking and walking prior to that time frame. Husband states 3 past strokes with the last being last Saturday at [facility]. Patient was also seen diagnosed with pneumonia upon this visit. Patient does have past deficits from previous strokes.

## 2018-08-20 NOTE — ED Notes (Signed)
Patient taken to CT scan.

## 2018-08-20 NOTE — H&P (Signed)
TRH H&P    Patient Demographics:    Susan Patel, is a 78 y.o. female  MRN: [id number]  DOB - 08/29/1940  Admit Date - 08/19/2018  Referring MD/NP/PA: Daleen Bo Mapp  Outpatient Primary MD for the patient is Dettinger, Fransisca Kaufmann, MD  Patient coming from: Home  Chief complaint-altered mental status   HPI:    Susan Patel  is a 78 y.o. 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  Patient was [facility] after she was treated for encephalopathy.  Patient was discharged on [place] for UTI. As per patient's husband patient has not been ambulatory since her last admission and required full assistance to walk with a walker.  Patient was able to eat and drink normal as per patient's husband. She does have baseline moderate aphasia and dysarthria. As per the patient's husband today he went out to throw garbage and when he came back he saw patient lying on the floor and had altered mental status.  EMS was called and patient brought to the ED.  In the ED patient was found to be hypothermic with rectal temperature 96.5, acute kidney injury with creatinine 4.61, her last creatinine was 1.01 on 08/15/2018.  Patient unable to provide any significant history. History obtained from patient's husband on phone.   Review of systems:    In addition to the HPI above,   All other systems reviewed and are negative.    Past History of the following :    Past Medical History:  Diagnosis Date  . Arthritis    'all over"  . Asthma   . Chronic pain   . COPD (chronic obstructive pulmonary disease) (HCC)    O2 per [place] at nights   . Depression   . Diabetes  mellitus without complication ([place])    borderline- , states she was on med., but MD told her "everything is under control so I threw the bottle away"  . Fibromyalgia   . GERD (gastroesophageal reflux disease)   . Hypertension   . Neuromuscular disorder (HCC)    parkinson, neuropathy- both feet & hands.  . Stroke [facility])    residual dysarthria      Past Surgical History:  Procedure Laterality Date  . ABDOMINAL HYSTERECTOMY    . ANTERIOR CERVICAL DECOMP/DISCECTOMY FUSION N/A 07/10/2015   Procedure: Cervical five-six, Cervical six-seven anterior cervical decompression with fusion plating and bonegraft;  Surgeon: Jovita Gamma, MD;  Location: [place] NEURO ORS;  Service: Neurosurgery;  Laterality: N/A;  . APPENDECTOMY    . BIOPSY EYE MUSCLE  03/20/2016   biopsy vessel to right eye due to swelling  . EYE SURGERY Bilateral    cataracts removed, /w "cyrstal lenses"   . LEFT HEART CATH AND CORONARY ANGIOGRAPHY N/A 05/23/2018   Procedure: LEFT HEART CATH AND CORONARY ANGIOGRAPHY;  Surgeon: Troy Sine, MD;  Location: [place] CV LAB;  Service: Cardiovascular;  Laterality: N/A;  . SHOULDER ARTHROSCOPY Right    x2   RCR- spurs removed       Social History:      Social History   Tobacco Use  . Smoking status: Never Smoker  . Smokeless tobacco: Never Used  Substance Use Topics  . Alcohol use: No       Family History :     Family History  Problem Relation Age of Onset  . Heart disease Mother   . Cancer Father   . Heart disease Father   . Arthritis Sister   . Heart failure Sister   . Cancer Brother   . Thyroid cancer Son   . Atrial fibrillation Son   . Arthritis Sister       Home Medications:   Prior to Admission medications   Medication Sig Start Date End Date Taking? Authorizing Provider  aspirin 81 MG chewable tablet Chew 81 mg by mouth daily.   Yes [provider]  carvedilol (COREG) 6.25 MG tablet Take 1 tablet (6.25 mg total) by mouth 2 (two) times  daily. 07/31/18  Yes Dettinger, Fransisca Kaufmann, MD  cetirizine (ZYRTEC) 10 MG tablet Take 10 mg by mouth 2 (two) times daily as needed for allergies.   Yes [provider]  clopidogrel (PLAVIX) 75 MG tablet Take 1 tablet (75 mg total) by mouth daily. 07/31/18  Yes Dettinger, Fransisca Kaufmann, MD  DULoxetine (CYMBALTA) 30 MG capsule Take 2 capsules (60 mg total) by mouth daily. 07/31/18  Yes Dettinger, Fransisca Kaufmann, MD  famotidine (PEPCID) 20 MG tablet Take 1 tablet (20 mg total) by mouth 2 (two) times daily as needed for heartburn or indigestion. 07/31/18  Yes Dettinger, Fransisca Kaufmann, MD  gabapentin (NEURONTIN) 300 MG capsule TAKE 1 CAPSULE BY MOUTH THREE TIMES A DAY 07/31/18  Yes Dettinger, Fransisca Kaufmann, MD  glimepiride (AMARYL) 2 MG tablet TAKE 1 TABLET (2 MG TOTAL) BY MOUTH DAILY BEFORE BREAKFAST. 07/31/18  Yes Dettinger, Fransisca Kaufmann, MD  isosorbide mononitrate (IMDUR) 30 MG 24 hr tablet Take 1 tablet (30 mg total) by mouth daily. 05/24/18 08/22/18 Yes Herminio Commons, MD  lisinopril (PRINIVIL,ZESTRIL) 20 MG tablet Take 1 tablet (20 mg total) by mouth 2 (two) times daily. 07/31/18  Yes Dettinger, Fransisca Kaufmann, MD  montelukast (SINGULAIR) 10 MG tablet 1 tablet daily 07/31/18  Yes Dettinger, Fransisca Kaufmann, MD  nabumetone (RELAFEN) 500 MG tablet Take 1 tablet (500 mg total) by mouth 2 (two) times daily. For muscle and joint pain 03/03/18  Yes Stacks, Cletus Gash, MD  tolterodine (DETROL) 1 MG tablet TAKE 1 TABLET BY MOUTH TWICE A DAY 04/10/18  Yes Eustaquio Maize, MD  traMADol (ULTRAM) 50 MG tablet TAKE 1 TABLET EVERY 12 HOURS AS NEEDED FOR MODERATE OR SEVERE PAIN 07/31/18  Yes Dettinger, Fransisca Kaufmann, MD  traZODone (DESYREL) 50 MG tablet Take 1 tablet (50 mg total) by mouth at bedtime. 07/31/18  Yes Dettinger, Fransisca Kaufmann, MD  vitamin B-12 (CYANOCOBALAMIN) 1000 MCG tablet Take 1,000 mcg by mouth daily.   Yes [provider]  acetaminophen (TYLENOL) 325 MG tablet Take 2 tablets (650 mg total) by mouth every 6 (six) hours as needed. Patient  taking differently: Take 650 mg by mouth every 6 (six) hours as needed for moderate pain or headache.  02/23/18   Varney Biles, MD  albuterol (PROVENTIL HFA;VENTOLIN HFA) 108 (90 Base) MCG/ACT inhaler Inhale 1-2 puffs into the lungs every 6 (six)  hours as needed for wheezing or shortness of breath. 01/01/16   Eustaquio Maize, MD  BREO ELLIPTA 200-25 MCG/INH AEPB TAKE 1 PUFF BY MOUTH EVERY DAY 08/14/18   Dettinger, Fransisca Kaufmann, MD  fluticasone (FLONASE) 50 MCG/ACT nasal spray Place 2 sprays into both nostrils daily. Patient taking differently: Place 2 sprays into both nostrils daily as needed for allergies.  01/28/18   Evelina Dun A, FNP  glucose blood test strip Use as instructed 07/04/17   Eustaquio Maize, MD  HYDROcodone-homatropine [facility]) 5-1.5 MG/5ML syrup Take 5 mLs by mouth every 6 (six) hours as needed for cough. 06/29/18   Dettinger, Fransisca Kaufmann, MD  Lancets [facility] ULTRASOFT) lancets Use as instructed 07/18/17   Eustaquio Maize, MD  latanoprost (XALATAN) 0.005 % ophthalmic solution Place 1 drop into both eyes at bedtime. 03/31/16   Love, Ivan Anchors, PA-C  Lidocaine 4 % PTCH Apply 1 patch topically 2 (two) times daily. Patient taking differently: Apply 1 patch topically 2 (two) times daily as needed (pain).  02/23/18   Varney Biles, MD  linaclotide (LINZESS) 290 MCG CAPS capsule TAKE 1 CAPSULE BY MOUTH DAILY BEFORE BREAKFAST. 07/31/18   Dettinger, Fransisca Kaufmann, MD  naproxen sodium (ALEVE) 220 MG tablet Take 440 mg by mouth 2 (two) times daily as needed (pain).    [provider]  nitroGLYCERIN (NITROSTAT) 0.4 MG SL tablet Place 1 tablet (0.4 mg total) under the tongue every 5 (five) minutes as needed. 01/12/18   Herminio Commons, MD  [facility] DELICA LANCETS FINE MISC TEST TWICE DAILY 12/26/17   Eustaquio Maize, MD  OXYGEN Inhale 3-4 L into the lungs at bedtime.    [provider]  predniSONE (DELTASONE) 10 MG tablet TAKE 6 TABS DAILY FOR 1 WEEK, THEN 4 TABS DAILY FOR WEEK 2,  THEN 2 TABS DAILY FOR WEEK 3. 08/04/18   Dettinger, Fransisca Kaufmann, MD  rosuvastatin (CRESTOR) 40 MG tablet Take 1 tablet (40 mg total) by mouth every evening. 07/31/18   Dettinger, Fransisca Kaufmann, MD  Triamcinolone Acetonide (TRIAMCINOLONE 0.1 % CREAM : EUCERIN) CREA Apply 1 application topically 2 (two) times daily. 07/18/17   Eustaquio Maize, MD  triamcinolone cream (KENALOG) 0.1 % APPLY TOPICALLY TWO TIMES DAILY Patient taking differently: Apply 1 application topically 2 (two) times daily as needed (rash).  05/08/18   Eustaquio Maize, MD     Allergies:     Allergies  Allergen Reactions  . Azithromycin Other (See Comments)    Renal failure  . Prednisone Other (See Comments)    Irritability and insomnia  . Sulfamethoxazole-Trimethoprim Other (See Comments)    Renal failure  . Amlodipine     Caused swelling in legs  . Ciprofloxacin Itching and Rash  . Tape Rash     Physical Exam:   Vitals  Blood pressure 105/73, pulse 80, temperature 98.8 F (37.1 C), resp. rate 18, height 5\' 5"  (1.651 m), weight 91.4 kg, SpO2 97 %.  1.  General: Patient is alert, appears anxious  2. Psychiatric: Alert, unable to check orientation due to patient's underlying aphasia and dysarthria  3. Neurologic: Patient unable to follow commands consistently, neurologic examination is limited.  Moving all extremities right more than left, dysarthria, aphasic  4. HEENMT:  Atraumatic normocephalic  5. Respiratory : Clear to auscultation bilaterally, no wheezing or crackles  6. Cardiovascular : S1-S2, regular, no murmur auscultated  7. Gastrointestinal:  Abdomen is soft, nontender, no organomegaly      Data  Review:    CBC Recent Labs  Lab 08/20/18 0023  WBC 6.8  HGB 12.1  HCT 37.9  PLT 229  MCV 89.0  MCH 28.4  MCHC 31.9  RDW 14.0  LYMPHSABS 1.1  MONOABS 1.0  EOSABS 0.3  BASOSABS 0.1    ------------------------------------------------------------------------------------------------------------------  Results for orders placed or performed during the hospital encounter of 08/19/18 (from the past 48 hour(s))  CBG monitoring, ED     Status: Abnormal   Collection Time: 08/20/18 12:01 AM  Result Value Ref Range   Glucose-Capillary 165 (H) 70 - 99 mg/dL  Blood culture (routine x 2)     Status: None (Preliminary result)   Collection Time: 08/20/18 12:08 AM  Result Value Ref Range   Specimen Description BLOOD RIGHT HAND    Special Requests      BOTTLES DRAWN AEROBIC AND ANAEROBIC Blood Culture adequate volume Performed at [facility], [address], [place], [place] [address]    Culture PENDING    Report Status PENDING   TSH     Status: None   Collection Time: 08/20/18 12:23 AM  Result Value Ref Range   TSH 3.283 0.350 - 4.500 uIU/mL    Comment: Performed by a 3rd Generation assay with a functional sensitivity of <=0.01 uIU/mL. Performed at [facility], [address], [place], [place] [address]   Ethanol     Status: None   Collection Time: 08/20/18 12:23 AM  Result Value Ref Range   Alcohol, Ethyl (B) <10 <10 mg/dL    Comment: (NOTE) Lowest detectable limit for serum alcohol is 10 mg/dL. For medical purposes only. Performed at [facility], [address] [place], [place] [address]   Protime-INR     Status: None   Collection Time: 08/20/18 12:23 AM  Result Value Ref Range   Prothrombin Time 13.5 11.4 - 15.2 seconds   INR 1.0 0.8 - 1.2    Comment: (NOTE) INR goal varies based on device and disease states. Performed at [facility], [address], [place], [place] [address]   APTT     Status: None   Collection Time: 08/20/18 12:23 AM  Result Value Ref Range   aPTT 28 24 - 36 seconds    Comment: Performed at [facility], [address] [place],  [address]  CBC     Status: None   Collection Time: 08/20/18 12:23 AM  Result Value Ref Range   WBC 6.8  4.0 - 10.5 K/uL   RBC 4.26 3.87 - 5.11 MIL/uL   Hemoglobin 12.1 12.0 - 15.0 g/dL   HCT 37.9 36.0 - 46.0 %   MCV 89.0 80.0 - 100.0 fL   MCH 28.4 26.0 - 34.0 pg   MCHC 31.9 30.0 - 36.0 g/dL   RDW 14.0 11.5 - 15.5 %   Platelets 229 150 - 400 K/uL   nRBC 0.0 0.0 - 0.2 %    Comment: Performed at [facility], [address] [place],  [address]  Differential     Status: None   Collection Time: 08/20/18 12:23 AM  Result Value Ref Range   Neutrophils Relative % 63 %   Neutro Abs 4.3 1.7 - 7.7 K/uL   Lymphocytes Relative 16 %   Lymphs Abs 1.1 0.7 - 4.0 K/uL   Monocytes Relative 14 %   Monocytes Absolute 1.0 0.1 - 1.0 K/uL   Eosinophils Relative 5 %   Eosinophils Absolute 0.3 0.0 - 0.5 K/uL   Basophils Relative 1 %  Basophils Absolute 0.1 0.0 - 0.1 K/uL   Immature Granulocytes 1 %   Abs Immature Granulocytes 0.05 0.00 - 0.07 K/uL    Comment: Performed at [facility], [address], [place], [place] [address]  Comprehensive metabolic panel     Status: Abnormal   Collection Time: 08/20/18 12:23 AM  Result Value Ref Range   Sodium 135 135 - 145 mmol/L   Potassium 3.9 3.5 - 5.1 mmol/L   Chloride 99 98 - 111 mmol/L   CO2 22 22 - 32 mmol/L   Glucose, Bld 182 (H) 70 - 99 mg/dL   BUN 37 (H) 8 - 23 mg/dL   Creatinine, Ser 4.61 (H) 0.44 - 1.00 mg/dL   Calcium 9.0 8.9 - 10.3 mg/dL   Total Protein 6.8 6.5 - 8.1 g/dL   Albumin 3.4 (L) 3.5 - 5.0 g/dL   AST 26 15 - 41 U/L   ALT 27 0 - 44 U/L   Alkaline Phosphatase 85 38 - 126 U/L   Total Bilirubin 0.9 0.3 - 1.2 mg/dL   GFR calc non Af Amer 9 (L) >60 mL/min   GFR calc Af Amer 10 (L) >60 mL/min   Anion gap 14 5 - 15    Comment: Performed at [facility], [address], [place], [place] [address]  Lactic acid, plasma     Status: None   Collection Time: 08/20/18 12:23 AM  Result Value Ref Range   Lactic Acid, Venous 1.6 0.5 - 1.9 mmol/L    Comment: Performed at [facility], [address] [place], [place] [address]  Blood gas,  venous     Status: Abnormal   Collection Time: 08/20/18 12:23 AM  Result Value Ref Range   FIO2 32.00    pH, Ven 7.265 7.250 - 7.430   pCO2, Ven 55.9 44.0 - 60.0 mmHg   pO2, Ven <31.0 (LL) 32.0 - 45.0 mmHg    Comment: CRITICAL RESULT CALLED TO, READ BACK BY AND VERIFIED WITH: HARDEN,J AT 0055 ON 08/20/2018 BY EVA    Bicarbonate 20.0 20.0 - 28.0 mmol/L   Acid-base deficit 1.5 0.0 - 2.0 mmol/L   O2 Saturation 33.1 %   Patient temperature 37.0     Comment: Performed at [facility], [address], [place], [place] [address]  Blood culture (routine x 2)     Status: None (Preliminary result)   Collection Time: 08/20/18 12:24 AM  Result Value Ref Range   Specimen Description RIGHT ANTECUBITAL    Special Requests      BOTTLES DRAWN AEROBIC AND ANAEROBIC Blood Culture adequate volume Performed at [facility], [address], [place],  [address]    Culture PENDING    Report Status PENDING   Urine rapid drug screen (hosp performed)     Status: None   Collection Time: 08/20/18  2:28 AM  Result Value Ref Range   Opiates NONE DETECTED NONE DETECTED   Cocaine NONE DETECTED NONE DETECTED   Benzodiazepines NONE DETECTED NONE DETECTED   Amphetamines NONE DETECTED NONE DETECTED   Tetrahydrocannabinol NONE DETECTED NONE DETECTED   Barbiturates NONE DETECTED NONE DETECTED    Comment: (NOTE) DRUG SCREEN FOR MEDICAL PURPOSES ONLY.  IF CONFIRMATION IS NEEDED FOR ANY PURPOSE, NOTIFY LAB WITHIN 5 DAYS. LOWEST DETECTABLE LIMITS FOR URINE DRUG SCREEN Drug Class                     Cutoff (ng/mL) Amphetamine and metabolites    1000 Barbiturate and metabolites  200 Benzodiazepine                 846 Tricyclics and metabolites     300 Opiates and metabolites        300 Cocaine and metabolites        300 THC                            50 Performed at [facility], [address] [place], [place] [address]   Urinalysis, Routine w reflex microscopic     Status: Abnormal   Collection Time:  08/20/18  2:28 AM  Result Value Ref Range   Color, Urine AMBER (A) YELLOW    Comment: BIOCHEMICALS MAY BE AFFECTED BY COLOR   APPearance CLOUDY (A) CLEAR   Specific Gravity, Urine 1.026 1.005 - 1.030   pH 5.0 5.0 - 8.0   Glucose, UA NEGATIVE NEGATIVE mg/dL   Hgb urine dipstick SMALL (A) NEGATIVE   Bilirubin Urine NEGATIVE NEGATIVE   Ketones, ur 5 (A) NEGATIVE mg/dL   Protein, ur 100 (A) NEGATIVE mg/dL   Nitrite NEGATIVE NEGATIVE   Leukocytes,Ua NEGATIVE NEGATIVE   RBC / HPF 0-5 0 - 5 RBC/hpf   WBC, UA 0-5 0 - 5 WBC/hpf   Bacteria, UA RARE (A) NONE SEEN   Squamous Epithelial / LPF 0-5 0 - 5    Comment: Performed at [facility], [address] [place], [place] [address]  Lactic acid, plasma     Status: None   Collection Time: 08/20/18  3:20 AM  Result Value Ref Range   Lactic Acid, Venous 1.6 0.5 - 1.9 mmol/L    Comment: Performed at [facility], [address] [place], [place] [address]    Chemistries  Recent Labs  Lab 08/20/18 0023  NA 135  K 3.9  CL 99  CO2 22  GLUCOSE 182*  BUN 37*  CREATININE 4.61*  CALCIUM 9.0  AST 26  ALT 27  ALKPHOS 85  BILITOT 0.9   ------------------------------------------------------------------------------------------------------------------  ------------------------------------------------------------------------------------------------------------------ GFR: Estimated Creatinine Clearance: 11.8 mL/min (A) (by C-G formula based on SCr of 4.61 mg/dL (H)). Liver Function Tests: Recent Labs  Lab 08/20/18 0023  AST 26  ALT 27  ALKPHOS 85  BILITOT 0.9  PROT 6.8  ALBUMIN 3.4*   No results for input(s): LIPASE, AMYLASE in the last 168 hours. No results for input(s): AMMONIA in the last 168 hours. Coagulation Profile: Recent Labs  Lab 08/20/18 0023  INR 1.0   Cardiac Enzymes: No results for input(s): CKTOTAL, CKMB, CKMBINDEX, TROPONINI in the last 168 hours. BNP (last 3 results) No results for input(s): PROBNP in the last  8760 hours. HbA1C: No results for input(s): HGBA1C in the last 72 hours. CBG: Recent Labs  Lab 08/20/18 0001  GLUCAP 165*   Lipid Profile: No results for input(s): CHOL, HDL, LDLCALC, TRIG, CHOLHDL, LDLDIRECT in the last 72 hours. Thyroid Function Tests: Recent Labs    08/20/18 0023  TSH 3.283   Anemia Panel: No results for input(s): VITAMINB12, FOLATE, FERRITIN, TIBC, IRON, RETICCTPCT in the last 72 hours.  --------------------------------------------------------------------------------------------------------------- Urine analysis:    Component Value Date/Time   COLORURINE AMBER (A) 08/20/2018 0228   APPEARANCEUR CLOUDY (A) 08/20/2018 0228   LABSPEC 1.026 08/20/2018 0228   PHURINE 5.0 08/20/2018 0228   GLUCOSEU NEGATIVE 08/20/2018 0228   HGBUR SMALL (A) 08/20/2018 0228   BILIRUBINUR NEGATIVE 08/20/2018 0228   KETONESUR 5 (A) 08/20/2018 0228  PROTEINUR 100 (A) 08/20/2018 0228   NITRITE NEGATIVE 08/20/2018 0228   LEUKOCYTESUR NEGATIVE 08/20/2018 0228      Imaging Results:    Ct Head Wo Contrast  Result Date: 08/20/2018 CLINICAL DATA:  Unresponsive. EXAM: CT HEAD WITHOUT CONTRAST TECHNIQUE: Contiguous axial images were obtained from the base of the skull through the vertex without intravenous contrast. COMPARISON:  Oct 10, 2016 FINDINGS: Brain: No subdural, epidural, or subarachnoid hemorrhage. Cerebellum, brainstem, and basal cisterns are normal. A right a septal infarct is stable. White matter changes persists. A lacunar infarct in the right posterior parietal region is not definitely seen on the previous study but does not have an acute appearance. Lacunar infarcts in the right basal ganglia are more prominent the interval but also do not appear to be acute. No acute cortical ischemia noted. Ventricles and sulci are prominent but stable. No mass effect or midline shift. Vascular: Calcified atherosclerosis in the intracranial carotids. Skull: Normal. Negative for fracture  or focal lesion. Sinuses/Orbits: No acute finding. Other: None. IMPRESSION: 1. Lacunar infarcts in the right posterior parietal region and the right basal ganglia are more prominent or new since May of 2018. However, I suspect these are not acute. Chronic white matter changes. No acute intracranial abnormality noted. Electronically Signed   By: Dorise Bullion III M.D   On: 08/20/2018 01:17   Dg Chest Port 1 View  Result Date: 08/20/2018 CLINICAL DATA:  Patient found unresponsive. EXAM: PORTABLE CHEST 1 VIEW COMPARISON:  April 10, 2018 FINDINGS: The study is limited due to the low volume portable technique. Within this limitation, no pneumothorax. Cardiomediastinal silhouette is stable. The lungs are clear. No other acute abnormalities. IMPRESSION: The study is limited due to the low volume portable technique. However, no acute abnormalities are noted. Electronically Signed   By: Dorise Bullion III M.D   On: 08/20/2018 01:17    My personal review of EKG: Rhythm NSR, no ST-T changes   Assessment & Plan:    Active Problems:   Altered mental status   1. Altered mental status-patient has baseline aphasia and dysarthria, history is limited.  UA is clear, patient was started on Omnicef at the time of [facility] on 08/17/2018.  CT head is negative for acute infarct.Tele neurology evaluated the patient and recommend MRI/MRA brain on a nonurgent basis.  Will obtain EEG to rule out underlying seizure.  Will start Zosyn per pharmacy for possible sepsis.  Will obtain urine culture.  Blood cultures x2 have been obtained.  Follow blood culture results.  2. Acute kidney injury-patient's creatinine was 1.01 on 08/15/2018.  Today presenting with creatinine of 4.61.  Unclear etiology.  Started on normal saline at 125 mL/h.  Consider nephrology consultation in a.m.  Obtain urine sodium and urine creatinine.  Will hold lisinopril due to worsening creatinine  3. Diabetes mellitus type 2-we will start  sliding scale insulin with NovoLog.  Check CBG every 6 hours.  4. History of CVA-continue aspirin, Plavix, rosuvastatin.     5. Dilated cardiomyopathy-we will hold lisinopril, Coreg at this time   DVT Prophylaxis-   Heparin  AM Labs Ordered, also please review Full Orders  Family Communication: Admission, patients condition and plan of care including tests being ordered have been discussed with the patient's husband on phone* who indicate understanding and agree with the plan and Code Status.  Code Status: DNR  Admission status: Inpatient: Based on patients clinical presentation and evaluation of above clinical data, I have made  determination that patient meets Inpatient criteria at this time.  Time spent in minutes : 60 minutes   Oswald Hillock M.D on 08/20/2018 at 4:18 AM

## 2018-08-20 NOTE — Consult Note (Addendum)
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 LNK 23:00. She is not a candidate for TPA due to recent stroke. CTA head and neck are pending to evaluate for LVO. Hypothermic to 96 degrees on arrival.  Differential includes recurrent stroke, encephalopathy, seizure, with underlying cognitive impairment  Recommendations:  Rec ASA 325mg  vs PR dose in ED, once [facility] resulted CTA head and neck pending to exclude LVO Would hold tramadol, may be potentially epileptogenic.  Work-up for infectious etiology pending Rec admission with stroke protocol.  Case reviewed with ED MD previously.   Addendum: Severe acute renal failure, with Cr 4.61, GFR 9. Increased from 1.46 one week prior. Lower clinical suspicion of LVO. I spoke with patients husband who confirms that she has not been ambulatory since her recent admission, requires full assistance to walk with walker which is limited, assitance with meal preparation and bathing, baseline mild to moderate aphasia and dysarthria). Given ARF with poor premorbid function (pt requires 24 hour supervision with assistance with ADLs, mRS 4) case reviewed with NIR, to determine if patient would be a candidate for NIR prior to proceeding with CTA given high risk of increased renal failure. Transfer center called 1:40.   Addendum: Case briefly reviewed with NIR. Given mRS of 4 at baseline she would not be a candidate for NIR. In addition, patients husband stated that patient would not wish to proceed with CTA if risk for potential HD. ED called to discuss recommendations with MD.    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 LNK 23:00. Per report her husband left her inside at 11pm, in her USOH. When he returned at 11:45 she was not responsive. In the ED she is aphasic, no following commands. Hypothermic to 96 degrees. On EMS arrival on scene she was hypoxic to 80s. BG 198. BP 105/62, HR 72.   Diagnostic Testing: CTH no ICH on my review  Vital Signs:   Reviewed  Exam:  Unable to complete NIHSS Patient is a awake, visual tracking intermittently, anarthric, mute Not following commands consistently Nonverbal Asymmetry of palpebral fissures R>L Increased tone and rigidity, c/w parkinsonism previousl noted Movement of LE in plane of gravity Movement of UE R>L against gravity No abnormal movements or posturing  Medical Decision Making:  - Extensive number of diagnosis or management options are considered above.   - Extensive amount of complex data reviewed.   - High risk of complication and/or morbidity or mortality are associated with differential diagnostic considerations above.  - There may be uncertain outcome and increased probability of prolonged functional impairment or high probability of severe prolonged functional impairment associated with some of these differential diagnosis.   Medical Data Reviewed:  1.Data reviewed include clinical labs, radiology,  Medical Tests;   2.Tests results discussed w/performing or interpreting physician;   3.Obtaining/reviewing old medical records;  4.Obtaining case history from another source;  5.Independent review  of image, tracing or specimen.    Patient was informed the Neurology  Consult would happen via telehealth (remote video) and consented to receiving care in this manner.

## 2018-08-20 NOTE — ED Notes (Addendum)
Date and time results received: 08/20/18 00:58 (use smartphrase ".now" to insert current time)  Test: PO2 Critical Value: <31.0  Name of Provider Notified: Dr. Tomi Bamberger  Orders Received? Or Actions Taken?: Physician notified

## 2018-08-20 NOTE — ED Provider Notes (Signed)
[facility] EMERGENCY DEPARTMENT Provider Note   CSN: [id number] Arrival date & time: 08/19/18  2351  Time seen 23:49PM (patient was seen on arrival)  History   Chief Complaint Chief Complaint  Patient presents with  . Loss of Consciousness    HPI Susan Patel is a 78 y.o. female.   Level 5 caveat for altered mental status  HPI per EMS husband called that about 20 minutes before they arrived he found patient lying on the floor in the living room.  They did not ask any details.  Has been told him that patient had a stroke a few months ago, they do not know what deficit she had.  She also was [facility] about 2 days ago for a blood infection.  They report her initial pulse ox was in the 80s.  They states she was not diaphoretic.  Her CBG was 198, blood pressure 105/62, heart rate 72.  PCP Dettinger, Fransisca Kaufmann, MD   Past Medical History:  Diagnosis Date  . Arthritis    'all over"  . Asthma   . Chronic pain   . COPD (chronic obstructive pulmonary disease) (HCC)    O2 per Center at nights   . Depression   . Diabetes mellitus without complication ([place])    borderline- , states she was on med., but MD told her "everything is under control so I threw the bottle away"  . Fibromyalgia   . GERD (gastroesophageal reflux disease)   . Hypertension   . Neuromuscular disorder (HCC)    parkinson, neuropathy- both feet & hands.  . Stroke [facility])    residual dysarthria    Patient Active Problem List   Diagnosis Date Noted  . Pulmonary nodules 06/07/2018  . Coronary artery disease   . 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 History of stroke 04/04/2017  . 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 Contusion of left elbow 03/15/2017  . Dysmetria   . Supplemental oxygen dependent   . Hypoalbuminemia due to protein-calorie malnutrition ([place])   . Oropharyngeal dysphagia   . 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 Frequent falls 04/21/2016  . Type 2 diabetes  mellitus with peripheral neuropathy (HCC)   . Fibromyalgia   . Chronic pain syndrome   . Chronic obstructive pulmonary disease ([place])   . Gait disturbance, post-stroke   . Dysarthria, post-stroke   . Dysphagia, post-stroke   . Mixed hyperlipidemia   . 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  . Allergic rhinitis 12/17/2015  . Primary osteoarthritis of both knees 11/19/2015  . Obesity (BMI 30-39.9) 09/04/2015  . Essential hypertension 09/04/2015  . Constipation 09/04/2015  . Raynaud's phenomenon 08/26/2015  . Gastro-esophageal reflux disease with esophagitis 08/26/2015  . Spinal stenosis 08/26/2015  . Vitamin D deficiency 08/26/2015  . DDD (degenerative disc disease), cervical 07/10/2015  . Cervical radicular pain 04/30/2013  . Knee osteoarthritis 01/05/2012    Past Surgical History:  Procedure Laterality Date  . ABDOMINAL HYSTERECTOMY    . ANTERIOR CERVICAL DECOMP/DISCECTOMY FUSION N/A 07/10/2015   Procedure: Cervical five-six, Cervical six-seven anterior cervical decompression with fusion plating and bonegraft;  Surgeon: Jovita Gamma, MD;  Location: [facility];  Service: Neurosurgery;  Laterality: N/A;  . APPENDECTOMY    . BIOPSY EYE MUSCLE  03/20/2016   biopsy vessel to right eye due to swelling  . EYE SURGERY Bilateral    cataracts removed, /w "cyrstal lenses"   . LEFT HEART CATH AND CORONARY ANGIOGRAPHY  N/A 05/23/2018   Procedure: LEFT HEART CATH AND CORONARY ANGIOGRAPHY;  Surgeon: Troy Sine, MD;  Location: [facility];  Service: Cardiovascular;  Laterality: N/A;  . SHOULDER ARTHROSCOPY Right    x2   RCR- spurs removed      OB History   No obstetric history on file.      Home Medications    Prior to Admission medications   Medication Sig Start Date End Date Taking? Authorizing Provider  acetaminophen (TYLENOL) 325 MG tablet Take 2 tablets (650 mg total) by mouth every 6 (six) hours as needed. Patient taking differently: Take 650 mg by  mouth every 6 (six) hours as needed for moderate pain or headache.  02/23/18   Varney Biles, MD  albuterol (PROVENTIL HFA;VENTOLIN HFA) 108 (90 Base) MCG/ACT inhaler Inhale 1-2 puffs into the lungs every 6 (six) hours as needed for wheezing or shortness of breath. 01/01/16   Eustaquio Maize, MD  BREO ELLIPTA 200-25 MCG/INH AEPB TAKE 1 PUFF BY MOUTH EVERY DAY 08/14/18   Dettinger, Fransisca Kaufmann, MD  carvedilol (COREG) 6.25 MG tablet Take 1 tablet (6.25 mg total) by mouth 2 (two) times daily. 07/31/18   Dettinger, Fransisca Kaufmann, MD  cetirizine (ZYRTEC) 10 MG tablet Take 10 mg by mouth 2 (two) times daily as needed for allergies.    [provider]  clopidogrel (PLAVIX) 75 MG tablet Take 1 tablet (75 mg total) by mouth daily. 07/31/18   Dettinger, Fransisca Kaufmann, MD  DULoxetine (CYMBALTA) 30 MG capsule Take 2 capsules (60 mg total) by mouth daily. 07/31/18   Dettinger, Fransisca Kaufmann, MD  famotidine (PEPCID) 20 MG tablet Take 1 tablet (20 mg total) by mouth 2 (two) times daily as needed for heartburn or indigestion. 07/31/18   Dettinger, Fransisca Kaufmann, MD  fluticasone (FLONASE) 50 MCG/ACT nasal spray Place 2 sprays into both nostrils daily. Patient taking differently: Place 2 sprays into both nostrils daily as needed for allergies.  01/28/18   Evelina Dun A, FNP  gabapentin (NEURONTIN) 300 MG capsule TAKE 1 CAPSULE BY MOUTH THREE TIMES A DAY 07/31/18   Dettinger, Fransisca Kaufmann, MD  glimepiride (AMARYL) 2 MG tablet TAKE 1 TABLET (2 MG TOTAL) BY MOUTH DAILY BEFORE BREAKFAST. 07/31/18   Dettinger, Fransisca Kaufmann, MD  glucose blood test strip Use as instructed 07/04/17   Eustaquio Maize, MD  HYDROcodone-homatropine [facility]) 5-1.5 MG/5ML syrup Take 5 mLs by mouth every 6 (six) hours as needed for cough. 06/29/18   Dettinger, Fransisca Kaufmann, MD  isosorbide mononitrate (IMDUR) 30 MG 24 hr tablet Take 1 tablet (30 mg total) by mouth daily. 05/24/18 08/22/18  Herminio Commons, MD  Lancets [facility] ULTRASOFT) lancets Use as instructed 07/18/17    Eustaquio Maize, MD  latanoprost (XALATAN) 0.005 % ophthalmic solution Place 1 drop into both eyes at bedtime. 03/31/16   Love, Ivan Anchors, PA-C  Lidocaine 4 % PTCH Apply 1 patch topically 2 (two) times daily. Patient taking differently: Apply 1 patch topically 2 (two) times daily as needed (pain).  02/23/18   Varney Biles, MD  linaclotide (LINZESS) 290 MCG CAPS capsule TAKE 1 CAPSULE BY MOUTH DAILY BEFORE BREAKFAST. 07/31/18   Dettinger, Fransisca Kaufmann, MD  lisinopril (PRINIVIL,ZESTRIL) 20 MG tablet Take 1 tablet (20 mg total) by mouth 2 (two) times daily. 07/31/18   Dettinger, Fransisca Kaufmann, MD  montelukast (SINGULAIR) 10 MG tablet 1 tablet daily 07/31/18   Dettinger, Fransisca Kaufmann, MD  nabumetone (RELAFEN) 500 MG tablet Take 1  tablet (500 mg total) by mouth 2 (two) times daily. For muscle and joint pain 03/03/18   Claretta Fraise, MD  naproxen sodium (ALEVE) 220 MG tablet Take 440 mg by mouth 2 (two) times daily as needed (pain).    [provider]  nitroGLYCERIN (NITROSTAT) 0.4 MG SL tablet Place 1 tablet (0.4 mg total) under the tongue every 5 (five) minutes as needed. 01/12/18   Herminio Commons, MD  [facility] DELICA LANCETS FINE MISC TEST TWICE DAILY 12/26/17   Eustaquio Maize, MD  OXYGEN Inhale 3-4 L into the lungs at bedtime.    [provider]  predniSONE (DELTASONE) 10 MG tablet TAKE 6 TABS DAILY FOR 1 WEEK, THEN 4 TABS DAILY FOR WEEK 2, THEN 2 TABS DAILY FOR WEEK 3. 08/04/18   Dettinger, Fransisca Kaufmann, MD  rosuvastatin (CRESTOR) 40 MG tablet Take 1 tablet (40 mg total) by mouth every evening. 07/31/18   Dettinger, Fransisca Kaufmann, MD  tolterodine (DETROL) 1 MG tablet TAKE 1 TABLET BY MOUTH TWICE A DAY 04/10/18   Eustaquio Maize, MD  traMADol (ULTRAM) 50 MG tablet TAKE 1 TABLET EVERY 12 HOURS AS NEEDED FOR MODERATE OR SEVERE PAIN 07/31/18   Dettinger, Fransisca Kaufmann, MD  traZODone (DESYREL) 50 MG tablet Take 1 tablet (50 mg total) by mouth at bedtime. 07/31/18   Dettinger, Fransisca Kaufmann, MD  Triamcinolone  Acetonide (TRIAMCINOLONE 0.1 % CREAM : EUCERIN) CREA Apply 1 application topically 2 (two) times daily. 07/18/17   Eustaquio Maize, MD  triamcinolone cream (KENALOG) 0.1 % APPLY TOPICALLY TWO TIMES DAILY Patient taking differently: Apply 1 application topically 2 (two) times daily as needed (rash).  05/08/18   Eustaquio Maize, MD    Family History Family History  Problem Relation Age of Onset  . Heart disease Mother   . Cancer Father   . Heart disease Father   . Arthritis Sister   . Heart failure Sister   . Cancer Brother   . Thyroid cancer Son   . Atrial fibrillation Son   . Arthritis Sister     Social History Social History   Tobacco Use  . Smoking status: Never Smoker  . Smokeless tobacco: Never Used  Substance Use Topics  . Alcohol use: No  . Drug use: No  lives at home Lives with spouse   Allergies   Azithromycin; Prednisone; Sulfamethoxazole-trimethoprim; Amlodipine; Ciprofloxacin; and Tape   Review of Systems Review of Systems  All other systems reviewed and are negative.    Physical Exam  ED Triage Vitals  Enc Vitals Group     BP 08/19/18 2359 137/89     Pulse Rate 08/19/18 2359 74     Resp 08/19/18 2359 14     Temp 08/19/18 2359 (!) 96.5 F (35.8 C)     Temp Source 08/19/18 2359 Rectal     SpO2 08/19/18 2359 96 %     Weight 08/20/18 0001 201 lb 8 oz (91.4 kg)     Height 08/20/18 0001 5\' 5"  (1.651 m)     Head Circumference --      Peak Flow --      Pain Score 08/20/18 0000 0     Pain Loc --      Pain Edu? --      Excl. in [place]? --    Vital signs normal hypothermia    Physical Exam Vitals signs and nursing note reviewed.  Constitutional:      Appearance: Normal appearance. She is obese.  HENT:     Head: Normocephalic and atraumatic.     Right Ear: External ear normal.     Left Ear: External ear normal.     Nose: Nose normal.     Mouth/Throat:     Comments: Only opens her mouth a few millimeters on command Eyes:     Extraocular  Movements: Extraocular movements intact.     Conjunctiva/sclera: Conjunctivae normal.     Pupils: Pupils are equal, round, and reactive to light.  Neck:     Musculoskeletal: Normal range of motion.  Cardiovascular:     Rate and Rhythm: Normal rate and regular rhythm.     Pulses: Normal pulses.  Pulmonary:     Effort: Pulmonary effort is normal. No respiratory distress.     Breath sounds: Normal breath sounds.  Abdominal:     General: Abdomen is flat. There is no distension.     Palpations: Abdomen is soft.  Musculoskeletal:        General: No deformity.     Comments: Patient's legs are stiff and she resists having her legs flexed.  She holds her arms in flexion and resist having her arms extended.  Skin:    Comments: Patient is cold to touch but dry  Neurological:     Mental Status: She is alert.     Comments: Unable to assess  Psychiatric:     Comments: Unable to assess      ED Treatments / Results  Labs (all labs ordered are listed, but only abnormal results are displayed) Results for orders placed or performed during the hospital encounter of 08/19/18  Blood culture (routine x 2)  Result Value Ref Range   Specimen Description RIGHT ANTECUBITAL    Special Requests      BOTTLES DRAWN AEROBIC AND ANAEROBIC Blood Culture adequate volume Performed at [facility], [address], [place], [place] [address]    Culture PENDING    Report Status PENDING   Blood culture (routine x 2)  Result Value Ref Range   Specimen Description BLOOD RIGHT HAND    Special Requests      BOTTLES DRAWN AEROBIC AND ANAEROBIC Blood Culture adequate volume Performed at [facility], [address], [place], [place] [address]    Culture PENDING    Report Status PENDING   TSH  Result Value Ref Range   TSH 3.283 0.350 - 4.500 uIU/mL  Ethanol  Result Value Ref Range   Alcohol, Ethyl (B) <10 <10 mg/dL  Protime-INR  Result Value Ref Range   Prothrombin Time 13.5 11.4 - 15.2 seconds   INR 1.0  0.8 - 1.2  APTT  Result Value Ref Range   aPTT 28 24 - 36 seconds  CBC  Result Value Ref Range   WBC 6.8 4.0 - 10.5 K/uL   RBC 4.26 3.87 - 5.11 MIL/uL   Hemoglobin 12.1 12.0 - 15.0 g/dL   HCT 37.9 36.0 - 46.0 %   MCV 89.0 80.0 - 100.0 fL   MCH 28.4 26.0 - 34.0 pg   MCHC 31.9 30.0 - 36.0 g/dL   RDW 14.0 11.5 - 15.5 %   Platelets 229 150 - 400 K/uL   nRBC 0.0 0.0 - 0.2 %  Differential  Result Value Ref Range   Neutrophils Relative % 63 %   Neutro Abs 4.3 1.7 - 7.7 K/uL   Lymphocytes Relative 16 %   Lymphs Abs 1.1 0.7 - 4.0 K/uL   Monocytes Relative 14 %   Monocytes  Absolute 1.0 0.1 - 1.0 K/uL   Eosinophils Relative 5 %   Eosinophils Absolute 0.3 0.0 - 0.5 K/uL   Basophils Relative 1 %   Basophils Absolute 0.1 0.0 - 0.1 K/uL   Immature Granulocytes 1 %   Abs Immature Granulocytes 0.05 0.00 - 0.07 K/uL  Comprehensive metabolic panel  Result Value Ref Range   Sodium 135 135 - 145 mmol/L   Potassium 3.9 3.5 - 5.1 mmol/L   Chloride 99 98 - 111 mmol/L   CO2 22 22 - 32 mmol/L   Glucose, Bld 182 (H) 70 - 99 mg/dL   BUN 37 (H) 8 - 23 mg/dL   Creatinine, Ser 4.61 (H) 0.44 - 1.00 mg/dL   Calcium 9.0 8.9 - 10.3 mg/dL   Total Protein 6.8 6.5 - 8.1 g/dL   Albumin 3.4 (L) 3.5 - 5.0 g/dL   AST 26 15 - 41 U/L   ALT 27 0 - 44 U/L   Alkaline Phosphatase 85 38 - 126 U/L   Total Bilirubin 0.9 0.3 - 1.2 mg/dL   GFR calc non Af Amer 9 (L) >60 mL/min   GFR calc Af Amer 10 (L) >60 mL/min   Anion gap 14 5 - 15  Lactic acid, plasma  Result Value Ref Range   Lactic Acid, Venous 1.6 0.5 - 1.9 mmol/L  Blood gas, venous  Result Value Ref Range   FIO2 32.00    pH, Ven 7.265 7.250 - 7.430   pCO2, Ven 55.9 44.0 - 60.0 mmHg   pO2, Ven <31.0 (LL) 32.0 - 45.0 mmHg   Bicarbonate 20.0 20.0 - 28.0 mmol/L   Acid-base deficit 1.5 0.0 - 2.0 mmol/L   O2 Saturation 33.1 %   Patient temperature 37.0   CBG monitoring, ED  Result Value Ref Range   Glucose-Capillary 165 (H) 70 - 99 mg/dL   Laboratory  interpretation all normal except hyperglycemia, acute kidney injury,   EKG EKG Interpretation  Date/Time:  Saturday August 19 2018 23:59:26 EDT Ventricular Rate:  75 PR Interval:    QRS Duration: 128 QT Interval:  390 QTC Calculation: 436 R Axis:   -3 Text Interpretation:  Sinus rhythm Nonspecific intraventricular conduction delay Inferior infarct, old Minimal ST elevation, anterior leads Lateral leads are also involved Artifact in lead(s) I II III aVR aVL aVF V3 V4 appears to be unchanged from 23 May 2018 Confirmed by Rolland Porter [phone]) on 08/20/2018 12:22:39 AM   Radiology Ct Head Wo Contrast  Result Date: 08/20/2018 CLINICAL DATA:  Unresponsive. EXAM: CT HEAD WITHOUT CONTRAST TECHNIQUE: Contiguous axial images were obtained from the base of the skull through the vertex without intravenous contrast. COMPARISON:  Oct 10, 2016 FINDINGS: Brain: No subdural, epidural, or subarachnoid hemorrhage. Cerebellum, brainstem, and basal cisterns are normal. A right a septal infarct is stable. White matter changes persists. A lacunar infarct in the right posterior parietal region is not definitely seen on the previous study but does not have an acute appearance. Lacunar infarcts in the right basal ganglia are more prominent the interval but also do not appear to be acute. No acute cortical ischemia noted. Ventricles and sulci are prominent but stable. No mass effect or midline shift. Vascular: Calcified atherosclerosis in the intracranial carotids. Skull: Normal. Negative for fracture or focal lesion. Sinuses/Orbits: No acute finding. Other: None. IMPRESSION: 1. Lacunar infarcts in the right posterior parietal region and the right basal ganglia are more prominent or new since May of 2018. However, I suspect these are not  acute. Chronic white matter changes. No acute intracranial abnormality noted. Electronically Signed   By: Dorise Bullion III M.D   On: 08/20/2018 01:17   Dg Chest Port 1 View  Result Date:  08/20/2018 CLINICAL DATA:  Patient found unresponsive. EXAM: PORTABLE CHEST 1 VIEW COMPARISON:  April 10, 2018 FINDINGS: The study is limited due to the low volume portable technique. Within this limitation, no pneumothorax. Cardiomediastinal silhouette is stable. The lungs are clear. No other acute abnormalities. IMPRESSION: The study is limited due to the low volume portable technique. However, no acute abnormalities are noted. Electronically Signed   By: Dorise Bullion III M.D   On: 08/20/2018 01:17     Ct Chest Wo Contrast  Result Date: 08/01/2018 CLINICAL DATA:  Lung nodule follow-up.  IMPRESSION: 1. Multiple bilateral pulmonary nodules measuring up to 7 mm in mean diameter are unchanged when compared to prior study. Non-contrast chest CT at 12-18 months (from today's scan) is considered optional for low-risk patients, but is recommended for high-risk patients. This recommendation follows the consensus statement: Guidelines for Management of Incidental Pulmonary Nodules Detected on CT Images: From the Fleischner Society 2017; Radiology 2017; 284:228-243. 2.  Aortic atherosclerosis (ICD10-I70.0). Electronically Signed   By: Titus Dubin M.D.   On: 08/01/2018 09:05     Procedures .Critical Care Performed by: Rolland Porter, MD Authorized by: Rolland Porter, MD   Critical care provider statement:    Critical care time (minutes):  39   Critical care was necessary to treat or prevent imminent or life-threatening deterioration of the following conditions:  CNS failure or compromise   Critical care was time spent personally by me on the following activities:  Discussions with consultants, examination of patient, obtaining history from patient or surrogate, ordering and review of laboratory studies, ordering and review of radiographic studies, pulse oximetry, re-evaluation of patient's condition and review of old charts   (including critical care time)  Medications Ordered in ED Medications  sodium  chloride 0.9 % bolus 1,000 mL (1,000 mLs Intravenous New Bag/Given 08/20/18 0201)     Initial Impression / Assessment and Plan / ED Course  I have reviewed the triage vital signs and the nursing notes.  Pertinent labs & imaging results that were available during my care of the patient were reviewed by me and considered in my medical decision making (see chart for details).        Patient's rectal temp showed she was hypothermic.  She was placed on a bair hugger.  Laboratory testing was ordered.  Head CT and chest x-ray was done.  Nursing staff spoke to husband and he states she was fine at about 11 PM.  He went outside to do something and when he came back and he found her on the floor.  He estimates he was gone about 45 minutes.  He states she has had 3 strokes including one about 1 week ago when she was admitted at [facility] for pneumonia.  12:37 AM patient discussed with tele-neurologist.  She recommends going ahead and ordering CTA of brain and neck which was done. She will talk to patient after she returns from CT scanner  Patient's kidney function was too bad to have the CTAs done.  They were canceled.  2:02 AM tele-neurologist states that patient is not a TPA candidate, she also is not a IR candidate now b/o her worsening kidney function. She recommends she get the MRI/MRA done but it is not emergent.  She recommends to  treat her as an acute stroke versus a seizure however with a recent diagnosis of Parkinson's she believes that explains the stiffness of her extremities.  2:15 AM temp Foley has just been placed.  Her temperature is 91.6.  Patient appears to be much more awake and alert now than she was.  She still is nonverbal in her communication.  However she is using her upper extremities now.  2:35 AM temperature is 36.5 C by temp Foley or 97.2.  Patient's creatinine has gone from normal in January up to 4.6 tonight.  She was given a liter of fluids, hopefully her renal injury is  from dehydration.  3:08 AM Dr. Darrick Meigs, hospitalist will admit.  Final Clinical Impressions(s) / ED Diagnoses   Final diagnoses:  Unresponsive episode  Hypothermia, initial encounter  Acute renal insufficiency    Plan admission  Rolland Porter, MD, Barbette Or, MD 08/20/18 [phone]

## 2018-08-20 NOTE — Progress Notes (Signed)
Patient is to be transferred to room 331 in stable condition. Report called to accepting RN. Husband made aware of transfer and verbalized understanding. All questions addressed and answered at this time.  Celestia Khat, RN

## 2018-08-20 NOTE — ED Notes (Signed)
Patient has had tele neuro complete. Physician states they will call Dr. Tomi Bamberger back after CTA is complete.

## 2018-08-21 ENCOUNTER — Inpatient Hospital Stay (HOSPITAL_COMMUNITY)
Admit: 2018-08-21 | Discharge: 2018-08-21 | Disposition: A | Discharge status: Home / Self Care | Payer: Medicare HMO | Attending: Family Medicine | Admitting: Family Medicine

## 2018-08-21 ENCOUNTER — Inpatient Hospital Stay (HOSPITAL_COMMUNITY): Payer: Medicare HMO

## 2018-08-21 DIAGNOSIS — E1142 Type 2 diabetes mellitus with diabetic polyneuropathy: Secondary | ICD-10-CM

## 2018-08-21 DIAGNOSIS — N179 Acute kidney failure, unspecified: Secondary | ICD-10-CM

## 2018-08-21 DIAGNOSIS — R4182 Altered mental status, unspecified: Secondary | ICD-10-CM | POA: Diagnosis not present

## 2018-08-21 DIAGNOSIS — R4189 Other symptoms and signs involving cognitive functions and awareness: Secondary | ICD-10-CM

## 2018-08-21 DIAGNOSIS — E782 Mixed hyperlipidemia: Secondary | ICD-10-CM

## 2018-08-21 DIAGNOSIS — G9340 Encephalopathy, unspecified: Secondary | ICD-10-CM | POA: Diagnosis present

## 2018-08-21 DIAGNOSIS — J410 Simple chronic bronchitis: Secondary | ICD-10-CM

## 2018-08-21 DIAGNOSIS — I679 Cerebrovascular disease, unspecified: Secondary | ICD-10-CM | POA: Diagnosis present

## 2018-08-21 DIAGNOSIS — I10 Essential (primary) hypertension: Secondary | ICD-10-CM

## 2018-08-21 DIAGNOSIS — I5022 Chronic systolic (congestive) heart failure: Secondary | ICD-10-CM | POA: Diagnosis present

## 2018-08-21 LAB — CBC
HCT: 32.6 % — ABNORMAL LOW (ref 36.0–46.0)
Hemoglobin: 10.5 g/dL — ABNORMAL LOW (ref 12.0–15.0)
MCH: 28.4 pg (ref 26.0–34.0)
MCHC: 32.2 g/dL (ref 30.0–36.0)
MCV: 88.1 fL (ref 80.0–100.0)
Platelets: 212 10*3/uL (ref 150–400)
RBC: 3.7 MIL/uL — ABNORMAL LOW (ref 3.87–5.11)
RDW: 13.8 % (ref 11.5–15.5)
WBC: 7.9 10*3/uL (ref 4.0–10.5)
nRBC: 0 % (ref 0.0–0.2)

## 2018-08-21 LAB — COMPREHENSIVE METABOLIC PANEL
ALT: 19 U/L (ref 0–44)
AST: 19 U/L (ref 15–41)
Albumin: 2.5 g/dL — ABNORMAL LOW (ref 3.5–5.0)
Alkaline Phosphatase: 70 U/L (ref 38–126)
Anion gap: 12 (ref 5–15)
BUN: 42 mg/dL — ABNORMAL HIGH (ref 8–23)
CO2: 19 mmol/L — ABNORMAL LOW (ref 22–32)
Calcium: 7.9 mg/dL — ABNORMAL LOW (ref 8.9–10.3)
Chloride: 103 mmol/L (ref 98–111)
Creatinine, Ser: 6.07 mg/dL — ABNORMAL HIGH (ref 0.44–1.00)
GFR calc Af Amer: 7 mL/min — ABNORMAL LOW (ref >60)
GFR calc non Af Amer: 6 mL/min — ABNORMAL LOW (ref >60)
Glucose, Bld: 136 mg/dL — ABNORMAL HIGH (ref 70–99)
Potassium: 3.4 mmol/L — ABNORMAL LOW (ref 3.5–5.1)
Sodium: 134 mmol/L — ABNORMAL LOW (ref 135–145)
Total Bilirubin: 0.8 mg/dL (ref 0.3–1.2)
Total Protein: 5.3 g/dL — ABNORMAL LOW (ref 6.5–8.1)

## 2018-08-21 LAB — BLOOD GAS, ARTERIAL
Acid-base deficit: 6.6 mmol/L — ABNORMAL HIGH (ref 0.0–2.0)
Bicarbonate: 19.3 mmol/L — ABNORMAL LOW (ref 20.0–28.0)
FIO2: 21
O2 Saturation: 94.4 %
Patient temperature: 37
pCO2 arterial: 30.3 mmHg — ABNORMAL LOW (ref 32.0–48.0)
pH, Arterial: 7.382 (ref 7.350–7.450)
pO2, Arterial: 73.3 mmHg — ABNORMAL LOW (ref 83.0–108.0)

## 2018-08-21 LAB — GLUCOSE, CAPILLARY
Glucose-Capillary: 101 mg/dL — ABNORMAL HIGH (ref 70–99)
Glucose-Capillary: 116 mg/dL — ABNORMAL HIGH (ref 70–99)
Glucose-Capillary: 117 mg/dL — ABNORMAL HIGH (ref 70–99)
Glucose-Capillary: 125 mg/dL — ABNORMAL HIGH (ref 70–99)
Glucose-Capillary: 129 mg/dL — ABNORMAL HIGH (ref 70–99)
Glucose-Capillary: 131 mg/dL — ABNORMAL HIGH (ref 70–99)
Glucose-Capillary: 44 mg/dL — CL (ref 70–99)
Glucose-Capillary: 69 mg/dL — ABNORMAL LOW (ref 70–99)
Glucose-Capillary: 79 mg/dL (ref 70–99)

## 2018-08-21 LAB — URINE CULTURE: Culture: <10000 — AB

## 2018-08-21 MED ORDER — ROSUVASTATIN CALCIUM 10 MG PO TABS
10.0000 mg | ORAL_TABLET | Freq: Every evening | ORAL | Status: DC
  Administered 2018-08-21 – 2018-08-25 (×4): 10 mg via ORAL
  Filled 2018-08-21 (×4): qty 1

## 2018-08-21 MED ORDER — DEXTROSE 50 % IV SOLN
INTRAVENOUS | Status: AC
  Administered 2018-08-21: 16:00:00 50 mL
  Filled 2018-08-21: qty 50

## 2018-08-21 NOTE — Progress Notes (Signed)
PROGRESS NOTE    Susan Patel  MAU:[id number] DOB: [birth year] DOA: 08/19/2018 PCP: Dettinger, Fransisca Kaufmann, MD    Brief Narrative:  78 year old female with history of chronic systolic heart failure with ejection fraction 40%, hypertension, diabetes, prior CVA with resultant dysarthria, was recently discharged from [facility] after being treated for left cerebellar stroke.  Since returning home, patient remained nonambulatory.  Patient's husband noted that she was increasingly confused and lethargic.  She was brought to the emergency room where she was noted to have acute kidney injury.  She was taking NSAIDs and lisinopril prior to admission.  Nephrology following.  Further work-up with repeat MRI indicated possible new infarct.  Neurology is also been consulted.   Assessment & Plan:   Principal Problem:   Acute encephalopathy Active Problems:   HTN (hypertension)   Type 2 diabetes mellitus with peripheral neuropathy (HCC)   Chronic pain syndrome   Chronic obstructive pulmonary disease (HCC)   Dysarthria, post-stroke   AKI (acute kidney injury) (HCC)   Mixed hyperlipidemia   Altered mental status   Chronic systolic CHF (congestive heart failure) (HCC)   Cerebrovascular disease   1. Acute encephalopathy.  Likely multifactorial in the setting of renal failure, polypharmacy.  MRI shows possible acute infarct.  Continue to monitor mental status.  No signs of infectious process at this time. 2. Recent CVA.  She was recently discharged from [facility] after being treated for posterior circulation strokes.  MRI was repeated in this facility and showed possible acute infarct.  She is also had EEG that showed nonspecific findings.  Neurology consult has been requested.  She is chronically on aspirin and Plavix. 3. Dysarthria, post stroke.  Seen by speech therapy to assess safety of swallowing.  Could not adequately participate with therapist. 4. Acute kidney injury.  Likely related to NSAIDs  and ACE inhibitors taken prior to admission.  Nephrology following.  Continue supportive management, follow renal function and urine output.  Renal ultrasound is unrevealing. 5. Diabetes.  Chronically on oral medications.  This is currently on hold.  Continue sliding scale insulin as needed. 6. Hyperlipidemia.  Continue statin 7. Chronic systolic congestive heart failure.  Ejection fraction 40%.  Monitor volume status and she is being hydrated for renal function. 8. COPD.  No evidence of wheezing.  She wears oxygen at night.  Will check ABG since she is still altered   DVT prophylaxis: Heparin Code Status: DNR Family Communication: Discussed with husband over the phone Disposition Plan: Will likely need placement on discharge   Consultants:   Nephrology  Neurology  Procedures:     Antimicrobials:   Zosyn 4/5>4/6   Subjective: Patient does not answer questions or follow commands. She is awake and sitting up in bed  Objective: Vitals:   08/21/18 0430 08/21/18 0830 08/21/18 1230 08/21/18 1630  BP: (!) 154/76 108/78 133/70 (!) 152/86  Pulse: 74 78 69 68  Resp: 19 20 18 20   Temp: 99 F (37.2 C) 98.6 F (37 C) 98.5 F (36.9 C) 98.6 F (37 C)  TempSrc: Oral Oral Axillary Axillary  SpO2: 98% 97% 99% 100%  Weight:      Height:        Intake/Output Summary (Last 24 hours) at 08/21/2018 1814 Last data filed at 08/21/2018 1516 Gross per 24 hour  Intake 1537.71 ml  Output 150 ml  Net 1387.71 ml   Filed Weights   08/20/18 0001 08/20/18 0600 08/20/18 1828  Weight: 91.4 kg 89.1 kg  90.7 kg    Examination:  General exam: Appears calm and comfortable  Respiratory system: Clear to auscultation. Respiratory effort normal. Cardiovascular system: S1 & S2 heard, RRR. No JVD, murmurs, rubs, gallops or clicks. No pedal edema. Gastrointestinal system: Abdomen is nondistended, soft and nontender. No organomegaly or masses felt. Normal bowel sounds heard. Central nervous system:  Alert and oriented. No focal neurological deficits. Extremities: Symmetric 5 x 5 power. Skin: No rashes, lesions or ulcers Psychiatry: does not participate in conversation    Data Reviewed: I have personally reviewed following labs and imaging studies  CBC: Recent Labs  Lab 08/20/18 0023 08/21/18 0814  WBC 6.8 7.9  NEUTROABS 4.3  --   HGB 12.1 10.5*  HCT 37.9 32.6*  MCV 89.0 88.1  PLT 229 270   Basic Metabolic Panel: Recent Labs  Lab 08/20/18 0023 08/20/18 1307 08/21/18 0814  NA 135 137 134*  K 3.9 3.5 3.4*  CL 99 104 103  CO2 22 21* 19*  GLUCOSE 182* 69* 136*  BUN 37* 42* 42*  CREATININE 4.61* 5.23* 6.07*  CALCIUM 9.0 8.3* 7.9*   GFR: Estimated Creatinine Clearance: 8.9 mL/min (A) (by C-G formula based on SCr of 6.07 mg/dL (H)). Liver Function Tests: Recent Labs  Lab 08/20/18 0023 08/21/18 0814  AST 26 19  ALT 27 19  ALKPHOS 85 70  BILITOT 0.9 0.8  PROT 6.8 5.3*  ALBUMIN 3.4* 2.5*   No results for input(s): LIPASE, AMYLASE in the last 168 hours. No results for input(s): AMMONIA in the last 168 hours. Coagulation Profile: Recent Labs  Lab 08/20/18 0023  INR 1.0   Cardiac Enzymes: Recent Labs  Lab 08/20/18 1307  CKTOTAL 182   BNP (last 3 results) No results for input(s): PROBNP in the last 8760 hours. HbA1C: Recent Labs    08/20/18 0023  HGBA1C 9.8*   CBG: Recent Labs  Lab 08/21/18 0743 08/21/18 1101 08/21/18 1135 08/21/18 1620 08/21/18 1712  GLUCAP 116* 69* 79 44* 125*   Lipid Profile: No results for input(s): CHOL, HDL, LDLCALC, TRIG, CHOLHDL, LDLDIRECT in the last 72 hours. Thyroid Function Tests: Recent Labs    08/20/18 0023  TSH 3.283   Anemia Panel: No results for input(s): VITAMINB12, FOLATE, FERRITIN, TIBC, IRON, RETICCTPCT in the last 72 hours. Sepsis Labs: Recent Labs  Lab 08/20/18 0023 08/20/18 0320  LATICACIDVEN 1.6 1.6    Recent Results (from the past 240 hour(s))  Blood culture (routine x 2)     Status:  None (Preliminary result)   Collection Time: 08/20/18 12:08 AM  Result Value Ref Range Status   Specimen Description BLOOD RIGHT HAND  Final   Special Requests   Final    BOTTLES DRAWN AEROBIC AND ANAEROBIC Blood Culture adequate volume   Culture   Final    NO GROWTH 1 DAY Performed at [facility], [address], [place], [place] [address]    Report Status PENDING  Incomplete  Blood culture (routine x 2)     Status: None (Preliminary result)   Collection Time: 08/20/18 12:24 AM  Result Value Ref Range Status   Specimen Description RIGHT ANTECUBITAL  Final   Special Requests   Final    BOTTLES DRAWN AEROBIC AND ANAEROBIC Blood Culture adequate volume   Culture   Final    NO GROWTH 1 DAY Performed at [facility], [address], [place], [place] [address]    Report Status PENDING  Incomplete  Culture, Urine     Status: Abnormal  Collection Time: 08/20/18  2:28 AM  Result Value Ref Range Status   Specimen Description   Final    URINE, CLEAN CATCH Performed at [facility], [address], [place], [place] [address]    Special Requests   Final    NONE Performed at [facility], [address] [place], [place] [address]    Culture (A)  Final    <10,000 COLONIES/mL INSIGNIFICANT GROWTH Performed at [facility] [address], [place], [place] [address]    Report Status 08/21/2018 FINAL  Final  MRSA PCR Screening     Status: Abnormal   Collection Time: 08/20/18  6:08 AM  Result Value Ref Range Status   MRSA by PCR POSITIVE (A) NEGATIVE Final    Comment:        The GeneXpert MRSA Assay (FDA approved for NASAL specimens only), is one component of a comprehensive MRSA colonization surveillance program. It is not intended to diagnose MRSA infection nor to guide or monitor treatment for MRSA infections. RESULT CALLED TO, READ BACK BY AND VERIFIED WITH: STONE,S AT 1336 ON 08/20/2018 BY JPM Performed at [facility], [address], [place], [place] [address]           Radiology Studies: Ct Head Wo Contrast  Result Date: 08/20/2018 CLINICAL DATA:  Unresponsive. EXAM: CT HEAD WITHOUT CONTRAST TECHNIQUE: Contiguous axial images were obtained from the base of the skull through the vertex without intravenous contrast. COMPARISON:  Oct 10, 2016 FINDINGS: Brain: No subdural, epidural, or subarachnoid hemorrhage. Cerebellum, brainstem, and basal cisterns are normal. A right a septal infarct is stable. White matter changes persists. A lacunar infarct in the right posterior parietal region is not definitely seen on the previous study but does not have an acute appearance. Lacunar infarcts in the right basal ganglia are more prominent the interval but also do not appear to be acute. No acute cortical ischemia noted. Ventricles and sulci are prominent but stable. No mass effect or midline shift. Vascular: Calcified atherosclerosis in the intracranial carotids. Skull: Normal. Negative for fracture or focal lesion. Sinuses/Orbits: No acute finding. Other: None. IMPRESSION: 1. Lacunar infarcts in the right posterior parietal region and the right basal ganglia are more prominent or new since May of 2018. However, I suspect these are not acute. Chronic white matter changes. No acute intracranial abnormality noted. Electronically Signed   By: Dorise Bullion III M.D   On: 08/20/2018 01:17   Mr Jodene Nam Head Wo Contrast  Result Date: 08/21/2018 CLINICAL DATA:  Altered mental status for 2 days.  No known injury. EXAM: MRI HEAD WITHOUT CONTRAST MRA HEAD WITHOUT CONTRAST TECHNIQUE: Multiplanar, multiecho pulse sequences of the brain and surrounding structures were obtained without intravenous contrast. Angiographic images of the head were obtained using MRA technique without contrast. COMPARISON:  CT head 08/20/2018. MR head 10/10/2016. CTA 10/09/2016. FINDINGS: Significant motion degradation. Small or subtle lesions could be overlooked. MRI HEAD FINDINGS Brain: Possible punctate focus of  restricted diffusion, series 3, image 82, LEFT frontal subcortical white matter, could represent an acute infarct. No definite acute hemorrhage, mass lesion, or extra-axial fluid. Hydrocephalus ex vacuo. Extensive white matter disease affecting the periventricular and subcortical regions, consistent with chronic microvascular ischemic change. Large area of chronic infarction affects the RIGHT occipital lobe. Second area of chronic infarction of moderate size affects the RIGHT parietal periventricular white matter. BILATERAL basal ganglia infarcts are chronic. Vascular: Reported separately. Skull and upper cervical spine: Normal marrow signal. Sinuses/Orbits: No acute findings  Other: Compared with prior imaging, similar appearance is noted. MRA HEAD FINDINGS Gross patency of the carotid arteries is established. Due to significant motion degradation. Assessment of the middle cerebral arteries is limited, but both M1 segments appear patent. Previously described BILATERAL ICA termini stenoses are poorly visualized on this exam, but likely still present, particularly on the LEFT. Both vertebral arteries are patent and contribute to the basilar artery, RIGHT dominant. The distal LEFT vertebral artery appears to be stenotic, of uncertain significance given the robust RIGHT vertebral. The basilar artery is patent. IMPRESSION: Significant motion degradation, marginally diagnostic exam. Possible small acute infarct, LEFT frontal subcortical white matter. Atrophy and small vessel disease. Chronic RIGHT hemisphere and BILATERAL basal ganglia infarcts. Patency of the BILATERAL internal carotid arteries and basilar artery is established on MRA. RIGHT vertebral dominant, LEFT vertebral distal stenosis, uncertain significance. See discussion above. Electronically Signed   By: Staci Righter M.D.   On: 08/21/2018 10:16   Mr Brain Wo Contrast  Result Date: 08/21/2018 CLINICAL DATA:  Altered mental status for 2 days.  No known  injury. EXAM: MRI HEAD WITHOUT CONTRAST MRA HEAD WITHOUT CONTRAST TECHNIQUE: Multiplanar, multiecho pulse sequences of the brain and surrounding structures were obtained without intravenous contrast. Angiographic images of the head were obtained using MRA technique without contrast. COMPARISON:  CT head 08/20/2018. MR head 10/10/2016. CTA 10/09/2016. FINDINGS: Significant motion degradation. Small or subtle lesions could be overlooked. MRI HEAD FINDINGS Brain: Possible punctate focus of restricted diffusion, series 3, image 82, LEFT frontal subcortical white matter, could represent an acute infarct. No definite acute hemorrhage, mass lesion, or extra-axial fluid. Hydrocephalus ex vacuo. Extensive white matter disease affecting the periventricular and subcortical regions, consistent with chronic microvascular ischemic change. Large area of chronic infarction affects the RIGHT occipital lobe. Second area of chronic infarction of moderate size affects the RIGHT parietal periventricular white matter. BILATERAL basal ganglia infarcts are chronic. Vascular: Reported separately. Skull and upper cervical spine: Normal marrow signal. Sinuses/Orbits: No acute findings Other: Compared with prior imaging, similar appearance is noted. MRA HEAD FINDINGS Gross patency of the carotid arteries is established. Due to significant motion degradation. Assessment of the middle cerebral arteries is limited, but both M1 segments appear patent. Previously described BILATERAL ICA termini stenoses are poorly visualized on this exam, but likely still present, particularly on the LEFT. Both vertebral arteries are patent and contribute to the basilar artery, RIGHT dominant. The distal LEFT vertebral artery appears to be stenotic, of uncertain significance given the robust RIGHT vertebral. The basilar artery is patent. IMPRESSION: Significant motion degradation, marginally diagnostic exam. Possible small acute infarct, LEFT frontal subcortical  white matter. Atrophy and small vessel disease. Chronic RIGHT hemisphere and BILATERAL basal ganglia infarcts. Patency of the BILATERAL internal carotid arteries and basilar artery is established on MRA. RIGHT vertebral dominant, LEFT vertebral distal stenosis, uncertain significance. See discussion above. Electronically Signed   By: Staci Righter M.D.   On: 08/21/2018 10:16   US Renal  Result Date: 08/21/2018 CLINICAL DATA:  Acute kidney injury EXAM: RENAL / URINARY TRACT ULTRASOUND COMPLETE COMPARISON:  CT abdomen 07/07/2012 FINDINGS: Right Kidney: Renal measurements: 11.5 by 5.3 by 5.6 cm = volume: 178 mL . Echogenicity within normal limits. No mass or hydronephrosis visualized. Left Kidney: Renal measurements: 10.1 by 6.3 by 5.9 cm = volume: 197 mL. Echogenicity within normal limits. No mass or hydronephrosis visualized. Bladder: Not observed, presumed empty. Limitation: Altered mental status, difficulty cooperating with the exam. IMPRESSION: 1. Normal sonographic appearance  of the kidneys. 2. The urinary bladder was not seen and is presumed empty. Electronically Signed   By: Van Clines M.D.   On: 08/21/2018 09:27   Dg Chest Port 1 View  Result Date: 08/20/2018 CLINICAL DATA:  Patient found unresponsive. EXAM: PORTABLE CHEST 1 VIEW COMPARISON:  April 10, 2018 FINDINGS: The study is limited due to the low volume portable technique. Within this limitation, no pneumothorax. Cardiomediastinal silhouette is stable. The lungs are clear. No other acute abnormalities. IMPRESSION: The study is limited due to the low volume portable technique. However, no acute abnormalities are noted. Electronically Signed   By: Dorise Bullion III M.D   On: 08/20/2018 01:17        Scheduled Meds:  aspirin  300 mg Rectal Daily   Chlorhexidine Gluconate Cloth  6 each Topical Q0600   clopidogrel  75 mg Oral Daily   heparin  5,000 Units Subcutaneous Q8H   insulin aspart  0-9 Units Subcutaneous Q6H    latanoprost  1 drop Both Eyes QHS   mupirocin ointment   Nasal BID   rosuvastatin  10 mg Oral Once   rosuvastatin  10 mg Oral QPM   Continuous Infusions:  dextrose 5 % and 0.45% NaCl 100 mL/hr at 08/21/18 1230   piperacillin-tazobactam (ZOSYN)  IV 12.5 mL/hr at 08/21/18 1516     LOS: 1 day    Time spent: 83mins    Kathie Dike, MD [facility]   If 7PM-7AM, please contact night-coverage [email or web address]  08/21/2018, 6:14 PM

## 2018-08-21 NOTE — Procedures (Signed)
ELECTROENCEPHALOGRAM REPORT   Patient: Susan Patel       Room #: P379 EEG No. ID: [id number] Age: 78 y.o.        Sex: female Referring Physician: Memon Report Date:  08/21/2018        Interpreting Physician: Alexis Goodell  History: [name] is an 78 y.o. female with a history of stroke presenting with altered mental status  Medications:  ASA, Plavix, Insulin, Zosyn, Crestor  Conditions of Recording:  This is a 21 channel routine scalp EEG performed with bipolar and monopolar montages arranged in accordance to the international 10/20 system of electrode placement. One channel was dedicated to EKG recording.  The patient is in the awake and altered state.  Description:  The waking background activity is slow and poorly organized.  It consists of a low voltage mixture of theta and delta activity that is diffusely distributed.  Intermittently there are noted periodic transients of triphasic morphology that are generalized for the most part.  After many of these bursts there are periods of synchronous attenuation lasting from one to three seconds.  There is no change in the clinical activity with these periods of attenuation.  This discontinuous activity is maintained throughout the recording.  Hyperventilation and intermittent photic stimulation were not performed  IMPRESSION: This is an abnormal electroencephalogram due to a discontinuous generalized, slow background rhythm with periodic transients of triphasic morphology followed by short periods of attenuation.  No change in clinical activity noted.  Clinical correlation recommended with diffuse encephalopathy in the differential.     Alexis Goodell, MD Neurology [phone] 08/21/2018, 11:52 AM

## 2018-08-21 NOTE — Progress Notes (Signed)
Assisted patient to sitting at bedside, unable to transfer to chair due to weakness - RN notified.   12:49 PM, 08/21/18 Lonell Grandchild, MPT Physical Therapist with [facility] [phone] office [phone] mobile phone

## 2018-08-21 NOTE — Progress Notes (Signed)
Patients BP elevated at 179/90. Mid Level notified. No new orders

## 2018-08-21 NOTE — Consult Note (Addendum)
[place] A. Merlene Laughter, MD     [email or web address]          Susan Patel is an 78 y.o. female.   ASSESSMENT/PLAN: 1.  Multifactorial encephalopathy most likely metabolic however.  Etiologies includes acute renal failure, UTI and pneumonia.  The tiny infarcts that the patient has had over the last couple days is unlikely to contribute to the encephalopathy.  Given the deteriorated creatinine, it is reasonable to hold aspirin for now.  Continue with Plavix however.  2.  Asterixis and myoclonus due to the metabolic encephalopathy  3.  Gait impairment due to the above both the encephalopathy and myoclonus/asterixis  4.  Abnormal EEG due to the encephalopathy especially renal failure  5. Multiple prior lacunar infarcts  The patient is a 78 year old female who was recently seen at an outside facility [facility] for acute dysarthria and gait impairment along with encephalopathy.  That work-up showed bilateral groundglass patchy pneumonia.  There was some concern that she could have the coronavirus although not sure this was tested.  Apparently however the appearance was atypical for this finding.  It is unclear however if she had a fever.  She has not been febrile during this hospitalization.  She also had MRI showing a tiny little cerebral infarct.  She was to go to skilled nursing facility but the husband took her home.  She however is not recovered and the husband stepped out for a while apparently to do something and in another room and found the patient on the floor confused.  Work-up during this hospitalization shows escalating creatinine of 4 on admission is now up to 6.  This a few days ago at the other facility is 1.  Repeat imaging here shows a tiny left frontal abnormality on DWI concerning for subacute/acute tiny infarct.  Review of systems is notable given the marked confusion.    GENERAL: Patient lays in bed with eyes opens but opens her eyes spontaneously.  HEENT:  Neck is supple no trauma appreciated.  ABDOMEN: soft  EXTREMITIES: No edema   BACK: Normal  SKIN: Normal by inspection.    MENTAL STATUS: She opens her eyes spontaneously.  She repeats that she is okay but does not follow commands.  She tries to resist the examination at times.  No clear dysarthria is appreciated.  She is not conversational.  CRANIAL NERVES: Right pupil is a 4 mm and sluggishly reactive.  The left is 3 and reactive.  The left eyes exotropic.  Extraocular movements are full with passive eye movements.  Visual fields are full; upper and lower facial muscles are normal in strength and symmetric, there is no flattening of the nasolabial folds; tongue is midline.  MOTOR: Got a lack of cooperation and unable to follow examination, the exact strength is not obtainable.  She does have antigravity strength throughout however.  Upper extremities is even likely 4/5.  There is mild drift of the upper lower extremities.  There is significant drift of the legs with both touching the bed.  COORDINATION: There is frequent flapping of the extremities symptoms associated with myoclonus.  No clear dysmetria is appreciated.  REFLEXES: Deep tendon reflexes are symmetrical and normal.    SENSATION: She responds to painful stimuli bilaterally.   NIH stroke scale 1, 2, 2, 1, 1, 2, 2 total equal 11.   Blood pressure (!) 152/86, pulse 68, temperature 98.6 F (37 C), temperature source Axillary, resp. rate 20, height 5\' 5"  (1.651 m), weight 90.7 kg,  SpO2 100 %.  Past Medical History:  Diagnosis Date  . Arthritis    'all over"  . Asthma   . Chronic pain   . COPD (chronic obstructive pulmonary disease) (HCC)    O2 per [place] at nights   . Depression   . Diabetes mellitus without complication ([place])    borderline- , states she was on med., but MD told her "everything is under control so I threw the bottle away"  . Fibromyalgia   . GERD (gastroesophageal reflux disease)   . Hypertension   .  Neuromuscular disorder (HCC)    parkinson, neuropathy- both feet & hands.  . 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 ABDOMINAL HYSTERECTOMY    . ANTERIOR CERVICAL DECOMP/DISCECTOMY FUSION N/A 07/10/2015   Procedure: Cervical five-six, Cervical six-seven anterior cervical decompression with fusion plating and bonegraft;  Surgeon: Jovita Gamma, MD;  Location: [facility];  Service: Neurosurgery;  Laterality: N/A;  . APPENDECTOMY    . BIOPSY EYE MUSCLE  03/20/2016   biopsy vessel to right eye due to swelling  . EYE SURGERY Bilateral    cataracts removed, /w "cyrstal lenses"   . LEFT HEART CATH AND CORONARY ANGIOGRAPHY N/A 05/23/2018   Procedure: LEFT HEART CATH AND CORONARY ANGIOGRAPHY;  Surgeon: Troy Sine, MD;  Location: [facility];  Service: Cardiovascular;  Laterality: N/A;  . SHOULDER ARTHROSCOPY Right    x2   RCR- spurs removed     Family History  Problem Relation Age of Onset  . Heart disease Mother   . Cancer Father   . Heart disease Father   . Arthritis Sister   . Heart failure Sister   . Cancer Brother   . Thyroid cancer Son   . Atrial fibrillation Son   . Arthritis Sister     Social History:  reports that she has never smoked. She has never used smokeless tobacco. She reports that she does not drink alcohol or use drugs.  Allergies:  Allergies  Allergen Reactions  . Azithromycin Other (See Comments)    Renal failure  . Prednisone Other (See Comments)    Irritability and insomnia  . Sulfamethoxazole-Trimethoprim Other (See Comments)    Renal failure  . Amlodipine Swelling    Caused swelling in legs  . Ciprofloxacin Itching and Rash  . Tape Rash    Medications: Prior to Admission medications   Medication Sig Start Date End Date Taking? Authorizing Provider  acetaminophen (TYLENOL) 325 MG tablet Take 2 tablets (650 mg total) by mouth every 6 (six) hours as needed. Patient taking differently:  Take 650 mg by mouth every 6 (six) hours as needed for moderate pain or headache.  02/23/18  Yes Varney Biles, MD  albuterol (PROVENTIL HFA;VENTOLIN HFA) 108 (90 Base) MCG/ACT inhaler Inhale 1-2 puffs into the lungs every 6 (six) hours as needed for wheezing or shortness of breath. 01/01/16  Yes Eustaquio Maize, MD  aspirin 81 MG chewable tablet Chew 81 mg by mouth daily.   Yes [provider]  BREO ELLIPTA 200-25 MCG/INH AEPB TAKE 1 PUFF BY MOUTH EVERY DAY Patient taking differently: Inhale 1 puff into the lungs daily.  08/14/18  Yes Dettinger, Fransisca Kaufmann, MD  carvedilol (COREG) 6.25 MG tablet Take 1 tablet (6.25 mg total) by mouth 2 (two) times daily. 07/31/18  Yes Dettinger, Fransisca Kaufmann, MD  cefdinir (OMNICEF) 300 MG capsule Take 1 capsule by mouth 2 (  two) times daily. 08/17/18  Yes [provider]  cetirizine (ZYRTEC) 10 MG tablet Take 10 mg by mouth 2 (two) times daily as needed for allergies.   Yes [provider]  clopidogrel (PLAVIX) 75 MG tablet Take 1 tablet (75 mg total) by mouth daily. 07/31/18  Yes Dettinger, Fransisca Kaufmann, MD  DULoxetine (CYMBALTA) 30 MG capsule Take 2 capsules (60 mg total) by mouth daily. 07/31/18  Yes Dettinger, Fransisca Kaufmann, MD  famotidine (PEPCID) 20 MG tablet Take 1 tablet (20 mg total) by mouth 2 (two) times daily as needed for heartburn or indigestion. 07/31/18  Yes Dettinger, Fransisca Kaufmann, MD  fluticasone (FLONASE) 50 MCG/ACT nasal spray Place 2 sprays into both nostrils daily. Patient taking differently: Place 2 sprays into both nostrils daily as needed for allergies.  01/28/18  Yes Hawks, Christy A, FNP  glimepiride (AMARYL) 2 MG tablet TAKE 1 TABLET (2 MG TOTAL) BY MOUTH DAILY BEFORE BREAKFAST. Patient taking differently: Take 2 mg by mouth daily with breakfast.  07/31/18  Yes Dettinger, Fransisca Kaufmann, MD  glucose blood test strip Use as instructed 07/04/17  Yes Eustaquio Maize, MD  isosorbide mononitrate (IMDUR) 30 MG 24 hr tablet Take 1 tablet (30 mg total)  by mouth daily. 05/24/18 08/22/18 Yes Herminio Commons, MD  Lancets [facility] ULTRASOFT) lancets Use as instructed 07/18/17  Yes Eustaquio Maize, MD  linaclotide (LINZESS) 290 MCG CAPS capsule TAKE 1 CAPSULE BY MOUTH DAILY BEFORE BREAKFAST. Patient taking differently: Take 290 mcg by mouth daily before breakfast.  07/31/18  Yes Dettinger, Fransisca Kaufmann, MD  montelukast (SINGULAIR) 10 MG tablet 1 tablet daily Patient taking differently: Take 10 mg by mouth daily.  07/31/18  Yes Dettinger, Fransisca Kaufmann, MD  nabumetone (RELAFEN) 500 MG tablet Take 1 tablet (500 mg total) by mouth 2 (two) times daily. For muscle and joint pain 03/03/18  Yes Stacks, Cletus Gash, MD  nitroGLYCERIN (NITROSTAT) 0.4 MG SL tablet Place 1 tablet (0.4 mg total) under the tongue every 5 (five) minutes as needed. 01/12/18  Yes Herminio Commons, MD  [facility] DELICA LANCETS FINE MISC TEST TWICE DAILY 12/26/17  Yes Eustaquio Maize, MD  OXYGEN Inhale 3-4 L into the lungs at bedtime.   Yes [provider]  rosuvastatin (CRESTOR) 40 MG tablet Take 1 tablet (40 mg total) by mouth every evening. 07/31/18  Yes Dettinger, Fransisca Kaufmann, MD  tolterodine (DETROL) 1 MG tablet TAKE 1 TABLET BY MOUTH TWICE A DAY Patient taking differently: Take 1 mg by mouth 2 (two) times daily.  04/10/18  Yes Eustaquio Maize, MD  traMADol (ULTRAM) 50 MG tablet TAKE 1 TABLET EVERY 12 HOURS AS NEEDED FOR MODERATE OR SEVERE PAIN Patient taking differently: Take 50 mg by mouth every 12 (twelve) hours as needed for moderate pain or severe pain.  07/31/18  Yes Dettinger, Fransisca Kaufmann, MD  traZODone (DESYREL) 50 MG tablet Take 1 tablet (50 mg total) by mouth at bedtime. 07/31/18  Yes Dettinger, Fransisca Kaufmann, MD  Triamcinolone Acetonide (TRIAMCINOLONE 0.1 % CREAM : EUCERIN) CREA Apply 1 application topically 2 (two) times daily. Patient taking differently: Apply 1 application topically 2 (two) times daily as needed for itching.  07/18/17  Yes Eustaquio Maize, MD  triamcinolone cream  (KENALOG) 0.1 % APPLY TOPICALLY TWO TIMES DAILY Patient taking differently: Apply 1 application topically 2 (two) times daily as needed (ITCHING).  05/08/18  Yes Eustaquio Maize, MD  vitamin B-12 (CYANOCOBALAMIN) 1000 MCG tablet Take 1,000 mcg by  mouth daily.   Yes [provider]    Scheduled Meds: . aspirin  300 mg Rectal Daily  . Chlorhexidine Gluconate Cloth  6 each Topical Q0600  . clopidogrel  75 mg Oral Daily  . heparin  5,000 Units Subcutaneous Q8H  . insulin aspart  0-9 Units Subcutaneous Q6H  . latanoprost  1 drop Both Eyes QHS  . mupirocin ointment   Nasal BID  . rosuvastatin  10 mg Oral Once  . rosuvastatin  10 mg Oral QPM   Continuous Infusions: . dextrose 5 % and 0.45% NaCl 100 mL/hr at 08/21/18 1230  . piperacillin-tazobactam (ZOSYN)  IV 12.5 mL/hr at 08/21/18 1516   PRN Meds:.acetaminophen **OR** acetaminophen, nitroGLYCERIN, ondansetron **OR** ondansetron (ZOFRAN) IV     Results for orders placed or performed during the hospital encounter of 08/19/18 (from the past 48 hour(s))  CBG monitoring, ED     Status: Abnormal   Collection Time: 08/20/18 12:01 AM  Result Value Ref Range   Glucose-Capillary 165 (H) 70 - 99 mg/dL  Blood culture (routine x 2)     Status: None (Preliminary result)   Collection Time: 08/20/18 12:08 AM  Result Value Ref Range   Specimen Description BLOOD RIGHT HAND    Special Requests      BOTTLES DRAWN AEROBIC AND ANAEROBIC Blood Culture adequate volume   Culture      NO GROWTH 1 DAY Performed at [facility], [address] [place], [place] [address]    Report Status PENDING   TSH     Status: None   Collection Time: 08/20/18 12:23 AM  Result Value Ref Range   TSH 3.283 0.350 - 4.500 uIU/mL    Comment: Performed by a 3rd Generation assay with a functional sensitivity of <=0.01 uIU/mL. Performed at [facility], [address] [place] [address], [place], [place] [address]   Ethanol     Status: None   Collection Time: 08/20/18 12:23 AM   Result Value Ref Range   Alcohol, Ethyl (B) <10 <10 mg/dL    Comment: (NOTE) Lowest detectable limit for serum alcohol is 10 mg/dL. For medical purposes only. Performed at [facility], [address], [place], [place] [address]   Protime-INR     Status: None   Collection Time: 08/20/18 12:23 AM  Result Value Ref Range   Prothrombin Time 13.5 11.4 - 15.2 seconds   INR 1.0 0.8 - 1.2    Comment: (NOTE) INR goal varies based on device and disease states. Performed at [facility], [address] [place], [place] [address]   APTT     Status: None   Collection Time: 08/20/18 12:23 AM  Result Value Ref Range   aPTT 28 24 - 36 seconds    Comment: Performed at [facility], [address], [place], [place] [address]  CBC     Status: None   Collection Time: 08/20/18 12:23 AM  Result Value Ref Range   WBC 6.8 4.0 - 10.5 K/uL   RBC 4.26 3.87 - 5.11 MIL/uL   Hemoglobin 12.1 12.0 - 15.0 g/dL   HCT 37.9 36.0 - 46.0 %   MCV 89.0 80.0 - 100.0 fL   MCH 28.4 26.0 - 34.0 pg   MCHC 31.9 30.0 - 36.0 g/dL   RDW 14.0 11.5 - 15.5 %   Platelets 229 150 - 400 K/uL   nRBC 0.0 0.0 - 0.2 %    Comment: Performed at [facility], [address] [place], [place] [address]  Differential  Status: None   Collection Time: 08/20/18 12:23 AM  Result Value Ref Range   Neutrophils Relative % 63 %   Neutro Abs 4.3 1.7 - 7.7 K/uL   Lymphocytes Relative 16 %   Lymphs Abs 1.1 0.7 - 4.0 K/uL   Monocytes Relative 14 %   Monocytes Absolute 1.0 0.1 - 1.0 K/uL   Eosinophils Relative 5 %   Eosinophils Absolute 0.3 0.0 - 0.5 K/uL   Basophils Relative 1 %   Basophils Absolute 0.1 0.0 - 0.1 K/uL   Immature Granulocytes 1 %   Abs Immature Granulocytes 0.05 0.00 - 0.07 K/uL    Comment: Performed at [facility], [address] [place], [place] [address]  Comprehensive metabolic panel     Status: Abnormal   Collection Time: 08/20/18 12:23 AM  Result Value Ref Range   Sodium 135 135 - 145 mmol/L   Potassium 3.9 3.5  - 5.1 mmol/L   Chloride 99 98 - 111 mmol/L   CO2 22 22 - 32 mmol/L   Glucose, Bld 182 (H) 70 - 99 mg/dL   BUN 37 (H) 8 - 23 mg/dL   Creatinine, Ser 4.61 (H) 0.44 - 1.00 mg/dL   Calcium 9.0 8.9 - 10.3 mg/dL   Total Protein 6.8 6.5 - 8.1 g/dL   Albumin 3.4 (L) 3.5 - 5.0 g/dL   AST 26 15 - 41 U/L   ALT 27 0 - 44 U/L   Alkaline Phosphatase 85 38 - 126 U/L   Total Bilirubin 0.9 0.3 - 1.2 mg/dL   GFR calc non Af Amer 9 (L) >60 mL/min   GFR calc Af Amer 10 (L) >60 mL/min   Anion gap 14 5 - 15    Comment: Performed at [facility], [address] [place], [place] [address]  Lactic acid, plasma     Status: None   Collection Time: 08/20/18 12:23 AM  Result Value Ref Range   Lactic Acid, Venous 1.6 0.5 - 1.9 mmol/L    Comment: Performed at [facility], [address] [place], [place] [address]  Blood gas, venous     Status: Abnormal   Collection Time: 08/20/18 12:23 AM  Result Value Ref Range   FIO2 32.00    pH, Ven 7.265 7.250 - 7.430   pCO2, Ven 55.9 44.0 - 60.0 mmHg   pO2, Ven <31.0 (LL) 32.0 - 45.0 mmHg    Comment: CRITICAL RESULT CALLED TO, READ BACK BY AND VERIFIED WITH: HARDEN,J AT 0055 ON 08/20/2018 BY EVA    Bicarbonate 20.0 20.0 - 28.0 mmol/L   Acid-base deficit 1.5 0.0 - 2.0 mmol/L   O2 Saturation 33.1 %   Patient temperature 37.0     Comment: Performed at [facility], [address], [place], [place] [address]  Hemoglobin A1c     Status: Abnormal   Collection Time: 08/20/18 12:23 AM  Result Value Ref Range   Hgb A1c MFr Bld 9.8 (H) 4.8 - 5.6 %    Comment: (NOTE) Pre diabetes:          5.7%-6.4% Diabetes:              >6.4% Glycemic control for   <7.0% adults with diabetes    Mean Plasma Glucose 234.56 mg/dL    Comment: Performed at [facility], [place] [address], [place], [place] [address]  Blood culture (routine x 2)     Status: None (Preliminary result)   Collection Time: 08/20/18 12:24 AM  Result Value Ref  Range   Specimen Description RIGHT ANTECUBITAL     Special Requests      BOTTLES DRAWN AEROBIC AND ANAEROBIC Blood Culture adequate volume   Culture      NO GROWTH 1 DAY Performed at [facility], [address], [place], [place] [address]    Report Status PENDING   Urine rapid drug screen (hosp performed)     Status: None   Collection Time: 08/20/18  2:28 AM  Result Value Ref Range   Opiates NONE DETECTED NONE DETECTED   Cocaine NONE DETECTED NONE DETECTED   Benzodiazepines NONE DETECTED NONE DETECTED   Amphetamines NONE DETECTED NONE DETECTED   Tetrahydrocannabinol NONE DETECTED NONE DETECTED   Barbiturates NONE DETECTED NONE DETECTED    Comment: (NOTE) DRUG SCREEN FOR MEDICAL PURPOSES ONLY.  IF CONFIRMATION IS NEEDED FOR ANY PURPOSE, NOTIFY LAB WITHIN 5 DAYS. LOWEST DETECTABLE LIMITS FOR URINE DRUG SCREEN Drug Class                     Cutoff (ng/mL) Amphetamine and metabolites    1000 Barbiturate and metabolites    200 Benzodiazepine                 151 Tricyclics and metabolites     300 Opiates and metabolites        300 Cocaine and metabolites        300 THC                            50 Performed at [facility], [address], [place], [place] [address]   Urinalysis, Routine w reflex microscopic     Status: Abnormal   Collection Time: 08/20/18  2:28 AM  Result Value Ref Range   Color, Urine AMBER (A) YELLOW    Comment: BIOCHEMICALS MAY BE AFFECTED BY COLOR   APPearance CLOUDY (A) CLEAR   Specific Gravity, Urine 1.026 1.005 - 1.030   pH 5.0 5.0 - 8.0   Glucose, UA NEGATIVE NEGATIVE mg/dL   Hgb urine dipstick SMALL (A) NEGATIVE   Bilirubin Urine NEGATIVE NEGATIVE   Ketones, ur 5 (A) NEGATIVE mg/dL   Protein, ur 100 (A) NEGATIVE mg/dL   Nitrite NEGATIVE NEGATIVE   Leukocytes,Ua NEGATIVE NEGATIVE   RBC / HPF 0-5 0 - 5 RBC/hpf   WBC, UA 0-5 0 - 5 WBC/hpf   Bacteria, UA RARE (A) NONE SEEN   Squamous Epithelial / LPF 0-5 0 - 5    Comment: Performed at [facility], [address], [place], [place] [address]   Culture, Urine     Status: Abnormal   Collection Time: 08/20/18  2:28 AM  Result Value Ref Range   Specimen Description      URINE, CLEAN CATCH Performed at [facility], [address], [place], [place] [address]    Special Requests      NONE Performed at [facility], [address], [place], [place] [address]    Culture (A)     <10,000 COLONIES/mL INSIGNIFICANT GROWTH Performed at [facility] [address], [place], [place] [address]    Report Status 08/21/2018 FINAL   Lactic acid, plasma     Status: None   Collection Time: 08/20/18  3:20 AM  Result Value Ref Range   Lactic Acid, Venous 1.6 0.5 - 1.9 mmol/L    Comment: Performed at [facility], [address], [place],  [address]  MRSA PCR Screening  Status: Abnormal   Collection Time: 08/20/18  6:08 AM  Result Value Ref Range   MRSA by PCR POSITIVE (A) NEGATIVE    Comment:        The GeneXpert MRSA Assay (FDA approved for NASAL specimens only), is one component of a comprehensive MRSA colonization surveillance program. It is not intended to diagnose MRSA infection nor to guide or monitor treatment for MRSA infections. RESULT CALLED TO, READ BACK BY AND VERIFIED WITH: STONE,S AT 1336 ON 08/20/2018 BY JPM Performed at [facility], [address], [place], [place] [address]   Glucose, capillary     Status: Abnormal   Collection Time: 08/20/18  6:38 AM  Result Value Ref Range   Glucose-Capillary 128 (H) 70 - 99 mg/dL  Glucose, capillary     Status: None   Collection Time: 08/20/18 11:36 AM  Result Value Ref Range   Glucose-Capillary 81 70 - 99 mg/dL  Basic metabolic panel     Status: Abnormal   Collection Time: 08/20/18  1:07 PM  Result Value Ref Range   Sodium 137 135 - 145 mmol/L   Potassium 3.5 3.5 - 5.1 mmol/L   Chloride 104 98 - 111 mmol/L   CO2 21 (L) 22 - 32 mmol/L   Glucose, Bld 69 (L) 70 - 99 mg/dL   BUN 42 (H) 8 - 23 mg/dL   Creatinine, Ser 5.23 (H) 0.44 - 1.00 mg/dL   Calcium 8.3 (L)  8.9 - 10.3 mg/dL   GFR calc non Af Amer 7 (L) >60 mL/min   GFR calc Af Amer 9 (L) >60 mL/min   Anion gap 12 5 - 15    Comment: Performed at [facility], [address] [place], [place] [address]  CK     Status: None   Collection Time: 08/20/18  1:07 PM  Result Value Ref Range   Total CK 182 38 - 234 U/L    Comment: Performed at [facility], [address] [place], [place] [address]  Glucose, capillary     Status: Abnormal   Collection Time: 08/20/18  5:21 PM  Result Value Ref Range   Glucose-Capillary 46 (L) 70 - 99 mg/dL  Glucose, capillary     Status: Abnormal   Collection Time: 08/20/18  6:37 PM  Result Value Ref Range   Glucose-Capillary 135 (H) 70 - 99 mg/dL  Glucose, capillary     Status: Abnormal   Collection Time: 08/20/18  8:07 PM  Result Value Ref Range   Glucose-Capillary 109 (H) 70 - 99 mg/dL   Comment 1 Notify RN   Glucose, capillary     Status: Abnormal   Collection Time: 08/21/18 12:06 AM  Result Value Ref Range   Glucose-Capillary 117 (H) 70 - 99 mg/dL   Comment 1 Notify RN   Glucose, capillary     Status: Abnormal   Collection Time: 08/21/18  4:30 AM  Result Value Ref Range   Glucose-Capillary 131 (H) 70 - 99 mg/dL  Glucose, capillary     Status: Abnormal   Collection Time: 08/21/18  7:43 AM  Result Value Ref Range   Glucose-Capillary 116 (H) 70 - 99 mg/dL  Comprehensive metabolic panel     Status: Abnormal   Collection Time: 08/21/18  8:14 AM  Result Value Ref Range   Sodium 134 (L) 135 - 145 mmol/L   Potassium 3.4 (L) 3.5 - 5.1 mmol/L   Chloride 103 98 - 111 mmol/L   CO2 19 (L) 22 - 32 mmol/L  Glucose, Bld 136 (H) 70 - 99 mg/dL   BUN 42 (H) 8 - 23 mg/dL   Creatinine, Ser 6.07 (H) 0.44 - 1.00 mg/dL   Calcium 7.9 (L) 8.9 - 10.3 mg/dL   Total Protein 5.3 (L) 6.5 - 8.1 g/dL   Albumin 2.5 (L) 3.5 - 5.0 g/dL   AST 19 15 - 41 U/L   ALT 19 0 - 44 U/L   Alkaline Phosphatase 70 38 - 126 U/L   Total Bilirubin 0.8 0.3 - 1.2 mg/dL   GFR calc non Af Amer 6  (L) >60 mL/min   GFR calc Af Amer 7 (L) >60 mL/min   Anion gap 12 5 - 15    Comment: Performed at [facility], [address] [place], [place] [address]  CBC     Status: Abnormal   Collection Time: 08/21/18  8:14 AM  Result Value Ref Range   WBC 7.9 4.0 - 10.5 K/uL   RBC 3.70 (L) 3.87 - 5.11 MIL/uL   Hemoglobin 10.5 (L) 12.0 - 15.0 g/dL   HCT 32.6 (L) 36.0 - 46.0 %   MCV 88.1 80.0 - 100.0 fL   MCH 28.4 26.0 - 34.0 pg   MCHC 32.2 30.0 - 36.0 g/dL   RDW 13.8 11.5 - 15.5 %   Platelets 212 150 - 400 K/uL   nRBC 0.0 0.0 - 0.2 %    Comment: Performed at [facility], [address] [place], [place] [address]  Glucose, capillary     Status: Abnormal   Collection Time: 08/21/18 11:01 AM  Result Value Ref Range   Glucose-Capillary 69 (L) 70 - 99 mg/dL  Glucose, capillary     Status: None   Collection Time: 08/21/18 11:35 AM  Result Value Ref Range   Glucose-Capillary 79 70 - 99 mg/dL  Glucose, capillary     Status: Abnormal   Collection Time: 08/21/18  4:20 PM  Result Value Ref Range   Glucose-Capillary 44 (LL) 70 - 99 mg/dL   Comment 1 Notify RN    Comment 2 Document in Chart   Glucose, capillary     Status: Abnormal   Collection Time: 08/21/18  5:12 PM  Result Value Ref Range   Glucose-Capillary 125 (H) 70 - 99 mg/dL    Studies/Results:   BRAIN MRI MRA FINDINGS: Significant motion degradation. Small or subtle lesions could be overlooked.  MRI HEAD FINDINGS  Brain: Possible punctate focus of restricted diffusion, series 3, image 82, LEFT frontal subcortical white matter, could represent an acute infarct. No definite acute hemorrhage, mass lesion, or extra-axial fluid. Hydrocephalus ex vacuo. Extensive white matter disease affecting the periventricular and subcortical regions, consistent with chronic microvascular ischemic change. Large area of chronic infarction affects the RIGHT occipital lobe. Second area of chronic infarction of moderate size affects the RIGHT  parietal periventricular white matter. BILATERAL basal ganglia infarcts are chronic.  Vascular: Reported separately.  Skull and upper cervical spine: Normal marrow signal.  Sinuses/Orbits: No acute findings  Other: Compared with prior imaging, similar appearance is noted.  MRA HEAD FINDINGS  Gross patency of the carotid arteries is established. Due to significant motion degradation. Assessment of the middle cerebral arteries is limited, but both M1 segments appear patent.  Previously described BILATERAL ICA termini stenoses are poorly visualized on this exam, but likely still present, particularly on the LEFT.  Both vertebral arteries are patent and contribute to the basilar artery, RIGHT dominant. The distal LEFT vertebral artery appears to be stenotic, of  uncertain significance given the robust RIGHT vertebral. The basilar artery is patent.  IMPRESSION: Significant motion degradation, marginally diagnostic exam.  Possible small acute infarct, LEFT frontal subcortical white matter.  Atrophy and small vessel disease. Chronic RIGHT hemisphere and BILATERAL basal ganglia infarcts.  Patency of the BILATERAL internal carotid arteries and basilar artery is established on MRA. RIGHT vertebral dominant, LEFT vertebral distal stenosis, uncertain significance. See discussion above.      The brain MRI and MRA are reviewed in person.  The scans are significantly degraded by motion artifact especially MRA.  There is a area of increased signal on DWI involving the left frontal region [place] of the frontal horn of the lateral ventricle worrisome for a tiny infarct.  There is no hemorrhage appreciated.  There is severe periventricular leukoencephalopathy consistent with severe chronic microvascular changes.  There is evidence of encephalomalacia suggestive of small infarct involving the parietal lobes bilaterally caudal to the posterior horn of the lateral ventricles on  both sides.  No significant intracranial occlusive disease appreciated although there is significant motion artifact.     Nohely Whitehorn A. Merlene Laughter, M.D.  Diplomate, Tax adviser of Psychiatry and Neurology ( Neurology). 08/21/2018, 5:58 PM

## 2018-08-21 NOTE — Progress Notes (Signed)
Inpatient Diabetes Program Recommendations  AACE/ADA: New Consensus Statement on Inpatient Glycemic Control (2015)  Target Ranges:  Prepandial:   less than 140 mg/dL      Peak postprandial:   less than 180 mg/dL (1-2 hours)      Critically ill patients:  140 - 180 mg/dL   Results for Susan Patel, Susan Patel (MRN [id number]) as of 08/21/2018 11:28  Ref. Range 08/20/2018 00:01 08/20/2018 06:38 08/20/2018 11:36 08/20/2018 17:21 08/20/2018 18:37 08/20/2018 20:07 08/21/2018 00:06 08/21/2018 04:30 08/21/2018 07:43 08/21/2018 11:01  Glucose-Capillary Latest Ref Range: 70 - 99 mg/dL 165 (H) 128 (H) 81 46 (L) 135 (H) 109 (H) 117 (H) 131 (H) 116 (H) 69 (L)   Review of Glycemic Control  Diabetes history: DM 2 Outpatient Diabetes medications: Glimepiride 2 mg Daily Current orders for Inpatient glycemic control: Novolog 0-9 units Q6 hours  Inpatient Diabetes Program Recommendations:    A1c 9.8% on 4/5  Mentation not clear. Glucose controlled on sensitive correction right now.  NPO and having lows occasionally. Consider a Novolog Custom Correction scale starting at 150 mg/dl until patient starts eating again.  -Custom Novolog correction scale 0-5 units       151-200  1 unit      201-250  2 units      251-300  3 units      301-350  4 units      351-400  5 units  Thanks,  Tama Headings RN, MSN, BC-ADM Inpatient Diabetes Coordinator Team Pager [phone] (8a-5p)

## 2018-08-21 NOTE — Evaluation (Signed)
Clinical/Bedside Swallow Evaluation Patient Details  Name: Susan Patel MRN: [id number] Date of Birth: 03-18-1941  Today's Date: 08/21/2018 Time: SLP Start Time (ACUTE ONLY): 18 SLP Stop Time (ACUTE ONLY): 1452 SLP Time Calculation (min) (ACUTE ONLY): 28 min  Past Medical History:  Past Medical History:  Diagnosis Date  . Arthritis    'all over"  . Asthma   . Chronic pain   . COPD (chronic obstructive pulmonary disease) (HCC)    O2 per [place] at nights   . Depression   . Diabetes mellitus without complication ([place])    borderline- , states she was on med., but MD told her "everything is under control so I threw the bottle away"  . Fibromyalgia   . GERD (gastroesophageal reflux disease)   . Hypertension   . Neuromuscular disorder (HCC)    parkinson, neuropathy- both feet & hands.  . 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 ABDOMINAL HYSTERECTOMY    . ANTERIOR CERVICAL DECOMP/DISCECTOMY FUSION N/A 07/10/2015   Procedure: Cervical five-six, Cervical six-seven anterior cervical decompression with fusion plating and bonegraft;  Surgeon: Jovita Gamma, MD;  Location: [place] NEURO ORS;  Service: Neurosurgery;  Laterality: N/A;  . APPENDECTOMY    . BIOPSY EYE MUSCLE  03/20/2016   biopsy vessel to right eye due to swelling  . EYE SURGERY Bilateral    cataracts removed, /w "cyrstal lenses"   . LEFT HEART CATH AND CORONARY ANGIOGRAPHY N/A 05/23/2018   Procedure: LEFT HEART CATH AND CORONARY ANGIOGRAPHY;  Surgeon: Troy Sine, MD;  Location: [place] CV LAB;  Service: Cardiovascular;  Laterality: N/A;  . SHOULDER ARTHROSCOPY Right    x2   RCR- spurs removed    HPI:  Patient was admitted to the hospital with alteration in mental status.  She was recently discharged from [facility] after being evaluated for acute cerebral infarct.  At that time, skilled nurse facility placement was recommended, but  patient's husband elected to take her home.  Patient had been essentially bedbound since her discharge.  She was brought back to the hospital with increasing lethargy and dysarthria.  Initial CT scan of the head was unrevealing.  Further work-up including basic chemistry showed that she has a new acute kidney injury.  She is on several medications which are renally cleared and could also be contributing to confusion.  CK level was checked and found to be normal.  Renal ultrasound has been requested.  Urine output has been poor.  Will continue on IV fluids.  She was taking ACE inhibitor's and nonsteroidal anti-inflammatories which likely contributed to her renal failure.  If renal function fails to improve by tomorrow, can consider nephrology input. MRI shows: Possible small acute infarct, LEFT frontal subcortical white matter. Atrophy and small vessel disease. Chronic RIGHT hemisphere and BILATERAL basal ganglia infarcts. BSE requested. Pt known to SLP from previous outpatient SLP therapy. Pt has had extensive SLP therapy at [facility], [facility], and [facility] for dysphagia, dysarthria, cognition, and dysphonia (bilateral vocal fold atrophy, muscle tension dysphonia). Her last objective assessment (MBSS) was completed 10/19/2016 with recommendation for P/HTL. Pt has been consuming a regular diet with thin liquids at home for the past couple of years. She endorses occasional "coughing" when she drinks too fast   Assessment / Plan / Recommendation Clinical Impression  Clinical swallow evaluation completed at bedside. Pt is known to this SLP from previous  outpatient therapy in 2018 for dysarthria and dysphagia. Pt lethargic, but rouses to voice and tactile stimulation to oral cavity (oral care completed). Pt vocalized some single words, but otherwise eyes closed and lethargic. During moments of alertness, she accepted ice chips, thin water via cup/straw, and a few bites of applesauce without overt signs/symptoms of  aspiration. Trial were limited however due to Pt lethargy. Pt's RN, Quillian Quince, reports lethargy throughout the day. Recommend NPO except for medications whole in puree and ok for ice chips/water after oral care when alert and provided by RN. Prognosis for diet advancement is good with improved alertness. SLP will follow.   SLP Visit Diagnosis: Dysphagia, unspecified (R13.10)    Aspiration Risk  Mild aspiration risk(due to lethargy and h/o dysphagia )    Diet Recommendation NPO except meds;Free water protocol after oral care   Medication Administration: Whole meds with puree Supervision: Staff to assist with self feeding Postural Changes: Remain upright for at least 30 minutes after po intake;Seated upright at 90 degrees    Other  Recommendations Oral Care Recommendations: Oral care prior to ice chip/H20;Staff/trained caregiver to provide oral care Other Recommendations: Clarify dietary restrictions   Follow up Recommendations Skilled Nursing facility      Frequency and Duration min 2x/week  1 week       Prognosis Prognosis for Safe Diet Advancement: Fair Barriers to Reach Goals: (lethargy)      Swallow Study   General Date of Onset: 08/19/18 HPI: Patient was admitted to the hospital with alteration in mental status.  She was recently discharged from hospital at [facility] after being evaluated for acute cerebral infarct.  At that time, skilled nurse facility placement was recommended, but patient's husband elected to take her home.  Patient had been essentially bedbound since her discharge.  She was brought back to the hospital with increasing lethargy and dysarthria.  Initial CT scan of the head was unrevealing.  Further work-up including basic chemistry showed that she has a new acute kidney injury.  She is on several medications which are renally cleared and could also be contributing to confusion.  CK level was checked and found to be normal.  Renal ultrasound has been requested.   Urine output has been poor.  Will continue on IV fluids.  She was taking ACE inhibitor's and nonsteroidal anti-inflammatories which likely contributed to her renal failure.  If renal function fails to improve by tomorrow, can consider nephrology input. MRI shows: Possible small acute infarct, LEFT frontal subcortical white matter. Atrophy and small vessel disease. Chronic RIGHT hemisphere and BILATERAL basal ganglia infarcts. BSE requested. Pt known to SLP from previous outpatient SLP therapy. Pt has had extensive SLP therapy at [facility], [facility], and [facility] for dysphagia, dysarthria, cognition, and dysphonia (bilateral vocal fold atrophy, muscle tension dysphonia). Her last objective assessment (MBSS) was completed 10/19/2016 with recommendation for P/HTL. Pt has been consuming a regular diet with thin liquids at home for the past couple of years. She endorses occasional "coughing" when she drinks too fast Type of Study: Bedside Swallow Evaluation Previous Swallow Assessment: June 2018 MBSS P/HTL  Diet Prior to this Study: NPO Temperature Spikes Noted: No Respiratory Status: Room air History of Recent Intubation: No Behavior/Cognition: Lethargic/Drowsy;Requires cueing Oral Cavity Assessment: Within Functional Limits Oral Care Completed by SLP: Yes Oral Cavity - Dentition: Adequate natural dentition Vision: Impaired for self-feeding Self-Feeding Abilities: Total assist Patient Positioning: Upright in bed Baseline Vocal Quality: (dysphonia) Volitional Cough: Cognitively unable to elicit Volitional  Swallow: Unable to elicit    Oral/Motor/Sensory Function Overall Oral Motor/Sensory Function: (Pt unable to sustain alertness to follow commands)   Ice Chips Ice chips: Within functional limits Presentation: Spoon   Thin Liquid Thin Liquid: Within functional limits Presentation: Straw;Cup    Nectar Thick Nectar Thick Liquid: Not tested   Honey Thick Honey Thick Liquid: Not tested   Puree Puree:  Within functional limits Presentation: Spoon   Solid     Solid: Not tested     Thank you,  Genene Churn, [place]  PORTER,DABNEY 08/21/2018,3:19 PM

## 2018-08-21 NOTE — Consult Note (Signed)
Susan Patel Admit Date: 08/19/2018 08/21/2018 Rexene Agent Requesting Physician:  Roderic Palau MD  Reason for Consult:  AKI  HPI:  77F admitted 4/5 after presenting to ED with AMS.  Patient is alone in room, she is not able to provide a history.  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  Patient was recommended for skilled nursing facility upon discharge, family decided to take patient home.  On 4/5 the husband returned home to find the patient lying on the floor and confused.  Patient presenting blood pressure of 103/54, afebrile.  Renal function was markedly abnormal at the time of presentation, 4.6 and worsened to 6.1 today.  Potassium 3.4, bicarbonate 19, calcium 7.9.  Urine analysis without hematuria, pyuria; 1-2+ proteinuria.  Renal ultrasound on 4/6 with an 11.5 cm right and 10.1 cm left kidney; neither which having hydronephrosis.  CK is normal.  It appears that upon returning home the patient continue taking lisinopril 20 mg twice daily and naproxen.  It is unclear how well the patient was eating and drinking.  Only 0.2 L of urine output documented since admission.  Currently receiving D5 0.45% NS saline at 100 mL/h     Creat (mg/dL)  Date Value  05/19/2018 0.99 (H)   Creatinine, Ser (mg/dL)  Date Value  08/21/2018 6.07 (H)  08/20/2018 5.23 (H)  08/20/2018 4.61 (H)  07/31/2018 1.46 (H)  04/10/2018 0.95  01/10/2018 0.90  03/23/2017 0.96  10/29/2016 1.15 (H)  10/20/2016 1.00  10/19/2016 1.01 (H)  ] I/Os: I/O last 3 completed shifts: In: 3663.9 [I.V.:2528.2; IV Piggyback:1135.8] Out: 200 [Urine:200]    ROS NSAIDS: Appears to have been taking naproxen IV Contrast exposed via CT on 3/28 but had normal serum creatinine on/3/31 TMP/SMX no identified exposure Hypotension mild hypotension upon presentation Balance of 12 systems is negative w/ exceptions as above  PMH  Past Medical History:  Diagnosis Date  . Arthritis    'all over"  . Asthma   . Chronic pain   . COPD (chronic obstructive pulmonary disease) (HCC)    O2 per [place] at nights   . Depression   . Diabetes mellitus without complication ([place])    borderline- , states she was on med., but MD told her "everything is under control so I threw the bottle away"  . Fibromyalgia   . GERD (gastroesophageal reflux disease)   . Hypertension   . Neuromuscular disorder (HCC)    parkinson, neuropathy- both feet & hands.  . 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 ABDOMINAL HYSTERECTOMY    . ANTERIOR CERVICAL DECOMP/DISCECTOMY FUSION N/A 07/10/2015   Procedure: Cervical five-six, Cervical six-seven anterior cervical decompression with fusion plating and bonegraft;  Surgeon: Jovita Gamma, MD;  Location: [place] NEURO ORS;  Service: Neurosurgery;  Laterality: N/A;  . APPENDECTOMY    . BIOPSY EYE MUSCLE  03/20/2016   biopsy vessel to right eye due to swelling  . EYE SURGERY Bilateral    cataracts removed, /w "cyrstal lenses"   . LEFT HEART CATH AND CORONARY ANGIOGRAPHY N/A 05/23/2018   Procedure: LEFT HEART  CATH AND CORONARY ANGIOGRAPHY;  Surgeon: Troy Sine, MD;  Location: [place] CV LAB;  Service: Cardiovascular;  Laterality: N/A;  . SHOULDER ARTHROSCOPY Right    x2   RCR- spurs removed    FH  Family History  Problem Relation Age of Onset  . Heart disease Mother   . Cancer Father   . Heart disease Father   . Arthritis Sister   . Heart failure Sister   . Cancer Brother   . Thyroid cancer Son   . Atrial fibrillation Son   . Arthritis Sister    SH  reports that she  has never smoked. She has never used smokeless tobacco. She reports that she does not drink alcohol or use drugs. Allergies  Allergies  Allergen Reactions  . Azithromycin Other (See Comments)    Renal failure  . Prednisone Other (See Comments)    Irritability and insomnia  . Sulfamethoxazole-Trimethoprim Other (See Comments)    Renal failure  . Amlodipine     Caused swelling in legs  . Ciprofloxacin Itching and Rash  . Tape Rash   Home medications Prior to Admission medications   Medication Sig Start Date End Date Taking? Authorizing Provider  aspirin 81 MG chewable tablet Chew 81 mg by mouth daily.   Yes [provider]  carvedilol (COREG) 6.25 MG tablet Take 1 tablet (6.25 mg total) by mouth 2 (two) times daily. 07/31/18  Yes Dettinger, Fransisca Kaufmann, MD  cetirizine (ZYRTEC) 10 MG tablet Take 10 mg by mouth 2 (two) times daily as needed for allergies.   Yes [provider]  clopidogrel (PLAVIX) 75 MG tablet Take 1 tablet (75 mg total) by mouth daily. 07/31/18  Yes Dettinger, Fransisca Kaufmann, MD  DULoxetine (CYMBALTA) 30 MG capsule Take 2 capsules (60 mg total) by mouth daily. 07/31/18  Yes Dettinger, Fransisca Kaufmann, MD  famotidine (PEPCID) 20 MG tablet Take 1 tablet (20 mg total) by mouth 2 (two) times daily as needed for heartburn or indigestion. 07/31/18  Yes Dettinger, Fransisca Kaufmann, MD  gabapentin (NEURONTIN) 300 MG capsule TAKE 1 CAPSULE BY MOUTH THREE TIMES A DAY 07/31/18  Yes Dettinger, Fransisca Kaufmann, MD  glimepiride (AMARYL) 2 MG tablet TAKE 1 TABLET (2 MG TOTAL) BY MOUTH DAILY BEFORE BREAKFAST. 07/31/18  Yes Dettinger, Fransisca Kaufmann, MD  isosorbide mononitrate (IMDUR) 30 MG 24 hr tablet Take 1 tablet (30 mg total) by mouth daily. 05/24/18 08/22/18 Yes Herminio Commons, MD  lisinopril (PRINIVIL,ZESTRIL) 20 MG tablet Take 1 tablet (20 mg total) by mouth 2 (two) times daily. 07/31/18  Yes Dettinger, Fransisca Kaufmann, MD  montelukast (SINGULAIR) 10 MG tablet 1 tablet daily 07/31/18  Yes Dettinger, Fransisca Kaufmann, MD   nabumetone (RELAFEN) 500 MG tablet Take 1 tablet (500 mg total) by mouth 2 (two) times daily. For muscle and joint pain 03/03/18  Yes Stacks, Cletus Gash, MD  tolterodine (DETROL) 1 MG tablet TAKE 1 TABLET BY MOUTH TWICE A DAY 04/10/18  Yes Eustaquio Maize, MD  traMADol (ULTRAM) 50 MG tablet TAKE 1 TABLET EVERY 12 HOURS AS NEEDED FOR MODERATE OR SEVERE PAIN 07/31/18  Yes Dettinger, Fransisca Kaufmann, MD  traZODone (DESYREL) 50 MG tablet Take 1 tablet (50 mg total) by mouth at bedtime. 07/31/18  Yes Dettinger, Fransisca Kaufmann, MD  vitamin B-12 (CYANOCOBALAMIN) 1000 MCG tablet Take 1,000 mcg by mouth daily.   Yes [provider]  acetaminophen (TYLENOL) 325 MG tablet Take 2 tablets (650 mg total) by mouth every 6 (six) hours as  needed. Patient taking differently: Take 650 mg by mouth every 6 (six) hours as needed for moderate pain or headache.  02/23/18   Varney Biles, MD  albuterol (PROVENTIL HFA;VENTOLIN HFA) 108 (90 Base) MCG/ACT inhaler Inhale 1-2 puffs into the lungs every 6 (six) hours as needed for wheezing or shortness of breath. 01/01/16   Eustaquio Maize, MD  BREO ELLIPTA 200-25 MCG/INH AEPB TAKE 1 PUFF BY MOUTH EVERY DAY 08/14/18   Dettinger, Fransisca Kaufmann, MD  fluticasone (FLONASE) 50 MCG/ACT nasal spray Place 2 sprays into both nostrils daily. Patient taking differently: Place 2 sprays into both nostrils daily as needed for allergies.  01/28/18   Evelina Dun A, FNP  glucose blood test strip Use as instructed 07/04/17   Eustaquio Maize, MD  HYDROcodone-homatropine [facility]) 5-1.5 MG/5ML syrup Take 5 mLs by mouth every 6 (six) hours as needed for cough. 06/29/18   Dettinger, Fransisca Kaufmann, MD  Lancets [facility] ULTRASOFT) lancets Use as instructed 07/18/17   Eustaquio Maize, MD  latanoprost (XALATAN) 0.005 % ophthalmic solution Place 1 drop into both eyes at bedtime. 03/31/16   Love, Ivan Anchors, PA-C  Lidocaine 4 % PTCH Apply 1 patch topically 2 (two) times daily. Patient taking differently: Apply 1 patch  topically 2 (two) times daily as needed (pain).  02/23/18   Varney Biles, MD  linaclotide (LINZESS) 290 MCG CAPS capsule TAKE 1 CAPSULE BY MOUTH DAILY BEFORE BREAKFAST. 07/31/18   Dettinger, Fransisca Kaufmann, MD  naproxen sodium (ALEVE) 220 MG tablet Take 440 mg by mouth 2 (two) times daily as needed (pain).    [provider]  nitroGLYCERIN (NITROSTAT) 0.4 MG SL tablet Place 1 tablet (0.4 mg total) under the tongue every 5 (five) minutes as needed. 01/12/18   Herminio Commons, MD  [facility] DELICA LANCETS FINE MISC TEST TWICE DAILY 12/26/17   Eustaquio Maize, MD  OXYGEN Inhale 3-4 L into the lungs at bedtime.    [provider]  predniSONE (DELTASONE) 10 MG tablet TAKE 6 TABS DAILY FOR 1 WEEK, THEN 4 TABS DAILY FOR WEEK 2, THEN 2 TABS DAILY FOR WEEK 3. 08/04/18   Dettinger, Fransisca Kaufmann, MD  rosuvastatin (CRESTOR) 40 MG tablet Take 1 tablet (40 mg total) by mouth every evening. 07/31/18   Dettinger, Fransisca Kaufmann, MD  Triamcinolone Acetonide (TRIAMCINOLONE 0.1 % CREAM : EUCERIN) CREA Apply 1 application topically 2 (two) times daily. 07/18/17   Eustaquio Maize, MD  triamcinolone cream (KENALOG) 0.1 % APPLY TOPICALLY TWO TIMES DAILY Patient taking differently: Apply 1 application topically 2 (two) times daily as needed (rash).  05/08/18   Eustaquio Maize, MD    Current Medications Scheduled Meds: . aspirin  300 mg Rectal Daily  . Chlorhexidine Gluconate Cloth  6 each Topical Q0600  . clopidogrel  75 mg Oral Daily  . heparin  5,000 Units Subcutaneous Q8H  . insulin aspart  0-9 Units Subcutaneous Q6H  . latanoprost  1 drop Both Eyes QHS  . mupirocin ointment   Nasal BID  . rosuvastatin  10 mg Oral Once  . rosuvastatin  40 mg Oral QPM   Continuous Infusions: . dextrose 5 % and 0.45% NaCl 100 mL/hr at 08/21/18 0251  . piperacillin-tazobactam (ZOSYN)  IV 3.375 g (08/20/18 2311)   PRN Meds:.acetaminophen **OR** acetaminophen, nitroGLYCERIN, ondansetron **OR** ondansetron (ZOFRAN)  IV  CBC Recent Labs  Lab 08/20/18 0023 08/21/18 0814  WBC 6.8 7.9  NEUTROABS 4.3  --   HGB 12.1 10.5*  HCT 37.9 32.6*  MCV 89.0 88.1  PLT 229 622   Basic Metabolic Panel Recent Labs  Lab 08/20/18 0023 08/20/18 1307 08/21/18 0814  NA 135 137 134*  K 3.9 3.5 3.4*  CL 99 104 103  CO2 22 21* 19*  GLUCOSE 182* 69* 136*  BUN 37* 42* 42*  CREATININE 4.61* 5.23* 6.07*  CALCIUM 9.0 8.3* 7.9*    Physical Exam  Blood pressure 108/78, pulse 78, temperature 98.6 F (37 C), temperature source Oral, resp. rate 20, height 5\' 5"  (1.651 m), weight 90.7 kg, SpO2 97 %. GEN: Elderly female, nonverbal, not interactive, no acute distress ENT: NCAT EYES: EOMI CV: Regular, normal S1 and S2, no rub PULM: Clear to auscultation bilaterally, normal work of breathing ABD: Soft, nontender SKIN: No rashes, lesions EXT: No peripheral edema   Assessment 78 year old female with acute kidney injury, oliguric, after returning home from recent outside hospital admission and resuming ACE inhibitor and nonsteroidals.  1. AKI, oliguric, probably ATN from hemodynamic and nephrotoxin exposures, as I suspect she was hypovolemic while on ACE inhibitor and nonsteroidals; negative ultrasound; urine analysis without hematuria or proteinuria 2. History of multiple CVAs, parkinsonism; nonverbal and dysarthric without significant interaction at baseline 3. Failure to thrive 4. Hypertension 5. Hyperlipidemia 6. DM 2 7. Polypharmacy, numerous CNS acting medications  Plan 1. Agree with holding ACE inhibitor and NSAIDs; providing supportive care and continuing maintenance fluids 2. She is not a long-term candidate for dialysis, hopefully she will turn the corner here from what is presumed now and ATN 3. Continue supportive care; continue to hold CNS acting medications as much as possible 4. Daily weights, Daily Renal Panel, Strict I/Os, Avoid nephrotoxins (NSAIDs, judicious IV Contrast) 5. We will follow along on  a daily basis   Pearson Grippe MD 08/21/2018, 11:25 AM

## 2018-08-21 NOTE — Progress Notes (Signed)
EEG Completed; Results Pending  

## 2018-08-22 LAB — RENAL FUNCTION PANEL
Albumin: 2.5 g/dL — ABNORMAL LOW (ref 3.5–5.0)
Anion gap: 13 (ref 5–15)
BUN: 44 mg/dL — ABNORMAL HIGH (ref 8–23)
CO2: 16 mmol/L — ABNORMAL LOW (ref 22–32)
Calcium: 7.9 mg/dL — ABNORMAL LOW (ref 8.9–10.3)
Chloride: 105 mmol/L (ref 98–111)
Creatinine, Ser: 6.94 mg/dL — ABNORMAL HIGH (ref 0.44–1.00)
GFR calc Af Amer: 6 mL/min — ABNORMAL LOW (ref >60)
GFR calc non Af Amer: 5 mL/min — ABNORMAL LOW (ref >60)
Glucose, Bld: 152 mg/dL — ABNORMAL HIGH (ref 70–99)
Phosphorus: 5.1 mg/dL — ABNORMAL HIGH (ref 2.5–4.6)
Potassium: 3.4 mmol/L — ABNORMAL LOW (ref 3.5–5.1)
Sodium: 134 mmol/L — ABNORMAL LOW (ref 135–145)

## 2018-08-22 LAB — GLUCOSE, CAPILLARY
Glucose-Capillary: 142 mg/dL — ABNORMAL HIGH (ref 70–99)
Glucose-Capillary: 163 mg/dL — ABNORMAL HIGH (ref 70–99)
Glucose-Capillary: 142 mg/dL — ABNORMAL HIGH (ref 70–99)
Glucose-Capillary: 105 mg/dL — ABNORMAL HIGH (ref 70–99)
Glucose-Capillary: 112 mg/dL — ABNORMAL HIGH (ref 70–99)

## 2018-08-22 NOTE — Progress Notes (Signed)
  Speech Language Pathology Treatment: Dysphagia  Patient Details Name: Susan Patel MRN: [id number] DOB: Apr 28, 1941 Today's Date: 08/22/2018 Time: 9935-7017 SLP Time Calculation (min) (ACUTE ONLY): 25 min  Assessment / Plan / Recommendation Clinical Impression  Pt seen at bedside for ongoing diagnostic dysphagia intervention. Pt continues to present with lethargy, however she is responsive to tactile stimulation to oral cavity (allowing oral care and showing po readiness with spoon). She opened her eyes briefly, but fewer vocalizations today. Pt presents with oral holding at times with po and delayed cough after thin liquids. Pt consumed 4 oz applesauce and 3 oz NTL juice via cup sips with 100% feeder assist with occasional oral holding, but no overt signs or symptoms of decreased airway protection. Will initiate D1/puree with NTL with 100% feeder assist with nursing staff when alert; po medications whole or crushed in puree as able. SLP will follow.    HPI HPI: Patient was admitted to the hospital with alteration in mental status.  She was recently discharged from hospital at [facility] after being evaluated for acute cerebral infarct.  At that time, skilled nurse facility placement was recommended, but patient's husband elected to take her home.  Patient had been essentially bedbound since her discharge.  She was brought back to the hospital with increasing lethargy and dysarthria.  Initial CT scan of the head was unrevealing.  Further work-up including basic chemistry showed that she has a new acute kidney injury.  She is on several medications which are renally cleared and could also be contributing to confusion.  CK level was checked and found to be normal.  Renal ultrasound has been requested.  Urine output has been poor.  Will continue on IV fluids.  She was taking ACE inhibitor's and nonsteroidal anti-inflammatories which likely contributed to her renal failure.  If renal function fails to improve  by tomorrow, can consider nephrology input. MRI shows: Possible small acute infarct, LEFT frontal subcortical white matter. Atrophy and small vessel disease. Chronic RIGHT hemisphere and BILATERAL basal ganglia infarcts. BSE requested. Pt known to SLP from previous outpatient SLP therapy. Pt has had extensive SLP therapy at [facility], [facility], and [facility] for dysphagia, dysarthria, cognition, and dysphonia (bilateral vocal fold atrophy, muscle tension dysphonia). Her last objective assessment (MBSS) was completed 10/19/2016 with recommendation for P/HTL. Pt has been consuming a regular diet with thin liquids at home for the past couple of years. She endorses occasional "coughing" when she drinks too fast      SLP Plan  Continue with current plan of care       Recommendations  Diet recommendations: Dysphagia 1 (puree);Nectar-thick liquid Liquids provided via: Cup Medication Administration: Whole meds with puree Supervision: Staff to assist with self feeding;Full supervision/cueing for compensatory strategies Compensations: Small sips/bites;Slow rate;Lingual sweep for clearance of pocketing Postural Changes and/or Swallow Maneuvers: Seated upright 90 degrees;Upright 30-60 min after meal                Oral Care Recommendations: Oral care BID;Staff/trained caregiver to provide oral care Follow up Recommendations: Skilled Nursing facility SLP Visit Diagnosis: Dysphagia, unspecified (R13.10) Plan: Continue with current plan of care       Thank you,  Genene Churn, [place]                 [place] 08/22/2018, 12:39 PM

## 2018-08-22 NOTE — Progress Notes (Signed)
PROGRESS NOTE    Susan Patel  RUE:[id number] DOB: 1941-01-29 DOA: 08/19/2018 PCP: Dettinger, Fransisca Kaufmann, MD    Brief Narrative:  78 year old female with history of chronic systolic heart failure with ejection fraction 40%, hypertension, diabetes, prior CVA with resultant dysarthria, was recently discharged from [facility] after being treated for left cerebellar stroke.  Since returning home, patient remained nonambulatory.  Patient's husband noted that she was increasingly confused and lethargic.  She was brought to the emergency room where she was noted to have acute kidney injury.  She was taking NSAIDs and lisinopril prior to admission.  Nephrology following.  Further work-up with repeat MRI indicated possible new infarct.  Neurology is also been consulted.   Assessment & Plan:   Principal Problem:   Acute encephalopathy Active Problems:   HTN (hypertension)   Type 2 diabetes mellitus with peripheral neuropathy (HCC)   Chronic pain syndrome   Chronic obstructive pulmonary disease (HCC)   Dysarthria, post-stroke   AKI (acute kidney injury) (HCC)   Mixed hyperlipidemia   Altered mental status   Chronic systolic CHF (congestive heart failure) (HCC)   Cerebrovascular disease   1. Acute encephalopathy.  Likely multifactorial in the setting of renal failure, polypharmacy.  MRI shows acute infarct, but per neurology, this would be unlikely to contribute to encephalopathy.  ABG does not show hypercapnia.  Check ammonia.  Continue to monitor mental status.  No signs of infectious process at this time. 2. Acute CVA.  She was recently discharged from [facility] after being treated for posterior circulation strokes.  She is found to have an acute small in the cortical left frontal subcortical white matter on MRI brain. She is chronically on aspirin and Plavix.  She recently had a full stroke work-up during her last hospitalization. 3. Dysarthria, post stroke.  Seen by speech therapy to  assess safety of swallowing.  Started on D1/pure with nectar thick liquids. 4. Acute kidney injury.  Likely related to NSAIDs and ACE inhibitors taken prior to admission.  Nephrology following.  Continue supportive management, follow renal function and urine output.  Renal ultrasound is unrevealing.  Creatinine continues to rise, urine output is good.  Continue to monitor. 5. Diabetes.  Chronically on oral medications.  This is currently on hold.  Continue sliding scale insulin as needed. 6. Hyperlipidemia.  Continue statin 7. Chronic systolic congestive heart failure.  Ejection fraction 40%.  Monitor volume status as she is being hydrated for renal function. 8. COPD.  No evidence of wheezing.  She wears oxygen at night.  Continue current treatments   DVT prophylaxis: Heparin Code Status: DNR Family Communication: Discussed with husband over the phone Disposition Plan: Discussed with husband and he is not interested in skilled nursing facility placement.  Wishes to take the patient home on discharge   Consultants:   Nephrology  Neurology  Procedures:     Antimicrobials:   Zosyn 4/5>4/6   Subjective: Patient is sleeping on my arrival.  Wakes up to voice.  Speech is difficult to comprehend.  Objective: Vitals:   08/22/18 0438 08/22/18 0830 08/22/18 1230 08/22/18 1630  BP: (!) 171/70 (!) 163/70 (!) 187/87 (!) 181/69  Pulse: 76 73 79 85  Resp: 18 18 18    Temp: 99.2 F (37.3 C) 98.2 F (36.8 C) 98.6 F (37 C) 98.6 F (37 C)  TempSrc: Axillary Oral Axillary Axillary  SpO2: 99% 100% 100% 99%  Weight:      Height:  Intake/Output Summary (Last 24 hours) at 08/22/2018 1922 Last data filed at 08/22/2018 1851 Gross per 24 hour  Intake 1188.33 ml  Output 3300 ml  Net -2111.67 ml   Filed Weights   08/20/18 0001 08/20/18 0600 08/20/18 1828  Weight: 91.4 kg 89.1 kg 90.7 kg    Examination:  General exam: Alert, awake, no distress Respiratory system: Clear to  auscultation. Respiratory effort normal. Cardiovascular system:RRR. No murmurs, rubs, gallops. Gastrointestinal system: Abdomen is nondistended, soft and nontender. No organomegaly or masses felt. Normal bowel sounds heard. Central nervous system: Remains dysarthric, does not participate in exam Extremities: No C/C/E, +pedal pulses Skin: No rashes, lesions or ulcers Psychiatry: Speech is difficult to comprehend.      Data Reviewed: I have personally reviewed following labs and imaging studies  CBC: Recent Labs  Lab 08/20/18 0023 08/21/18 0814  WBC 6.8 7.9  NEUTROABS 4.3  --   HGB 12.1 10.5*  HCT 37.9 32.6*  MCV 89.0 88.1  PLT 229 892   Basic Metabolic Panel: Recent Labs  Lab 08/20/18 0023 08/20/18 1307 08/21/18 0814 08/22/18 0452  NA 135 137 134* 134*  K 3.9 3.5 3.4* 3.4*  CL 99 104 103 105  CO2 22 21* 19* 16*  GLUCOSE 182* 69* 136* 152*  BUN 37* 42* 42* 44*  CREATININE 4.61* 5.23* 6.07* 6.94*  CALCIUM 9.0 8.3* 7.9* 7.9*  PHOS  --   --   --  5.1*   GFR: Estimated Creatinine Clearance: 7.8 mL/min (A) (by C-G formula based on SCr of 6.94 mg/dL (H)). Liver Function Tests: Recent Labs  Lab 08/20/18 0023 08/21/18 0814 08/22/18 0452  AST 26 19  --   ALT 27 19  --   ALKPHOS 85 70  --   BILITOT 0.9 0.8  --   PROT 6.8 5.3*  --   ALBUMIN 3.4* 2.5* 2.5*   No results for input(s): LIPASE, AMYLASE in the last 168 hours. No results for input(s): AMMONIA in the last 168 hours. Coagulation Profile: Recent Labs  Lab 08/20/18 0023  INR 1.0   Cardiac Enzymes: Recent Labs  Lab 08/20/18 1307  CKTOTAL 182   BNP (last 3 results) No results for input(s): PROBNP in the last 8760 hours. HbA1C: Recent Labs    08/20/18 0023  HGBA1C 9.8*   CBG: Recent Labs  Lab 08/21/18 2341 08/22/18 0359 08/22/18 0732 08/22/18 1109 08/22/18 1605  GLUCAP 101* 142* 163* 142* 105*   Lipid Profile: No results for input(s): CHOL, HDL, LDLCALC, TRIG, CHOLHDL, LDLDIRECT in the  last 72 hours. Thyroid Function Tests: Recent Labs    08/20/18 0023  TSH 3.283   Anemia Panel: No results for input(s): VITAMINB12, FOLATE, FERRITIN, TIBC, IRON, RETICCTPCT in the last 72 hours. Sepsis Labs: Recent Labs  Lab 08/20/18 0023 08/20/18 0320  LATICACIDVEN 1.6 1.6    Recent Results (from the past 240 hour(s))  Blood culture (routine x 2)     Status: None (Preliminary result)   Collection Time: 08/20/18 12:08 AM  Result Value Ref Range Status   Specimen Description BLOOD RIGHT HAND  Final   Special Requests   Final    BOTTLES DRAWN AEROBIC AND ANAEROBIC Blood Culture adequate volume   Culture   Final    NO GROWTH 2 DAYS Performed at [facility], [address], [place], [place] [address]    Report Status PENDING  Incomplete  Blood culture (routine x 2)     Status: None (Preliminary result)   Collection Time:  08/20/18 12:24 AM  Result Value Ref Range Status   Specimen Description RIGHT ANTECUBITAL  Final   Special Requests   Final    BOTTLES DRAWN AEROBIC AND ANAEROBIC Blood Culture adequate volume   Culture   Final    NO GROWTH 2 DAYS Performed at [facility], [address] [place], [place] [address]    Report Status PENDING  Incomplete  Culture, Urine     Status: Abnormal   Collection Time: 08/20/18  2:28 AM  Result Value Ref Range Status   Specimen Description   Final    URINE, CLEAN CATCH Performed at [facility], [address], [place], [place] [address]    Special Requests   Final    NONE Performed at [facility], [address], [place], [place] [address]    Culture (A)  Final    <10,000 COLONIES/mL INSIGNIFICANT GROWTH Performed at [facility] [address], [place], [place] [address]    Report Status 08/21/2018 FINAL  Final  MRSA PCR Screening     Status: Abnormal   Collection Time: 08/20/18  6:08 AM  Result Value Ref Range Status   MRSA by PCR POSITIVE (A) NEGATIVE Final    Comment:        The GeneXpert MRSA Assay  (FDA approved for NASAL specimens only), is one component of a comprehensive MRSA colonization surveillance program. It is not intended to diagnose MRSA infection nor to guide or monitor treatment for MRSA infections. RESULT CALLED TO, READ BACK BY AND VERIFIED WITH: STONE,S AT 1336 ON 08/20/2018 BY JPM Performed at [facility], [address] [place], [place] [address]          Radiology Studies: Mr Virgel Paling AT Contrast  Result Date: 08/21/2018 CLINICAL DATA:  Altered mental status for 2 days.  No known injury. EXAM: MRI HEAD WITHOUT CONTRAST MRA HEAD WITHOUT CONTRAST TECHNIQUE: Multiplanar, multiecho pulse sequences of the brain and surrounding structures were obtained without intravenous contrast. Angiographic images of the head were obtained using MRA technique without contrast. COMPARISON:  CT head 08/20/2018. MR head 10/10/2016. CTA 10/09/2016. FINDINGS: Significant motion degradation. Small or subtle lesions could be overlooked. MRI HEAD FINDINGS Brain: Possible punctate focus of restricted diffusion, series 3, image 82, LEFT frontal subcortical white matter, could represent an acute infarct. No definite acute hemorrhage, mass lesion, or extra-axial fluid. Hydrocephalus ex vacuo. Extensive white matter disease affecting the periventricular and subcortical regions, consistent with chronic microvascular ischemic change. Large area of chronic infarction affects the RIGHT occipital lobe. Second area of chronic infarction of moderate size affects the RIGHT parietal periventricular white matter. BILATERAL basal ganglia infarcts are chronic. Vascular: Reported separately. Skull and upper cervical spine: Normal marrow signal. Sinuses/Orbits: No acute findings Other: Compared with prior imaging, similar appearance is noted. MRA HEAD FINDINGS Gross patency of the carotid arteries is established. Due to significant motion degradation. Assessment of the middle cerebral arteries is limited, but both M1  segments appear patent. Previously described BILATERAL ICA termini stenoses are poorly visualized on this exam, but likely still present, particularly on the LEFT. Both vertebral arteries are patent and contribute to the basilar artery, RIGHT dominant. The distal LEFT vertebral artery appears to be stenotic, of uncertain significance given the robust RIGHT vertebral. The basilar artery is patent. IMPRESSION: Significant motion degradation, marginally diagnostic exam. Possible small acute infarct, LEFT frontal subcortical white matter. Atrophy and small vessel disease. Chronic RIGHT hemisphere and BILATERAL basal ganglia infarcts. Patency of the BILATERAL  internal carotid arteries and basilar artery is established on MRA. RIGHT vertebral dominant, LEFT vertebral distal stenosis, uncertain significance. See discussion above. Electronically Signed   By: Staci Righter M.D.   On: 08/21/2018 10:16   Mr Brain Wo Contrast  Result Date: 08/21/2018 CLINICAL DATA:  Altered mental status for 2 days.  No known injury. EXAM: MRI HEAD WITHOUT CONTRAST MRA HEAD WITHOUT CONTRAST TECHNIQUE: Multiplanar, multiecho pulse sequences of the brain and surrounding structures were obtained without intravenous contrast. Angiographic images of the head were obtained using MRA technique without contrast. COMPARISON:  CT head 08/20/2018. MR head 10/10/2016. CTA 10/09/2016. FINDINGS: Significant motion degradation. Small or subtle lesions could be overlooked. MRI HEAD FINDINGS Brain: Possible punctate focus of restricted diffusion, series 3, image 82, LEFT frontal subcortical white matter, could represent an acute infarct. No definite acute hemorrhage, mass lesion, or extra-axial fluid. Hydrocephalus ex vacuo. Extensive white matter disease affecting the periventricular and subcortical regions, consistent with chronic microvascular ischemic change. Large area of chronic infarction affects the RIGHT occipital lobe. Second area of chronic  infarction of moderate size affects the RIGHT parietal periventricular white matter. BILATERAL basal ganglia infarcts are chronic. Vascular: Reported separately. Skull and upper cervical spine: Normal marrow signal. Sinuses/Orbits: No acute findings Other: Compared with prior imaging, similar appearance is noted. MRA HEAD FINDINGS Gross patency of the carotid arteries is established. Due to significant motion degradation. Assessment of the middle cerebral arteries is limited, but both M1 segments appear patent. Previously described BILATERAL ICA termini stenoses are poorly visualized on this exam, but likely still present, particularly on the LEFT. Both vertebral arteries are patent and contribute to the basilar artery, RIGHT dominant. The distal LEFT vertebral artery appears to be stenotic, of uncertain significance given the robust RIGHT vertebral. The basilar artery is patent. IMPRESSION: Significant motion degradation, marginally diagnostic exam. Possible small acute infarct, LEFT frontal subcortical white matter. Atrophy and small vessel disease. Chronic RIGHT hemisphere and BILATERAL basal ganglia infarcts. Patency of the BILATERAL internal carotid arteries and basilar artery is established on MRA. RIGHT vertebral dominant, LEFT vertebral distal stenosis, uncertain significance. See discussion above. Electronically Signed   By: Staci Righter M.D.   On: 08/21/2018 10:16   US Renal  Result Date: 08/21/2018 CLINICAL DATA:  Acute kidney injury EXAM: RENAL / URINARY TRACT ULTRASOUND COMPLETE COMPARISON:  CT abdomen 07/07/2012 FINDINGS: Right Kidney: Renal measurements: 11.5 by 5.3 by 5.6 cm = volume: 178 mL . Echogenicity within normal limits. No mass or hydronephrosis visualized. Left Kidney: Renal measurements: 10.1 by 6.3 by 5.9 cm = volume: 197 mL. Echogenicity within normal limits. No mass or hydronephrosis visualized. Bladder: Not observed, presumed empty. Limitation: Altered mental status, difficulty  cooperating with the exam. IMPRESSION: 1. Normal sonographic appearance of the kidneys. 2. The urinary bladder was not seen and is presumed empty. Electronically Signed   By: Van Clines M.D.   On: 08/21/2018 09:27        Scheduled Meds:  Chlorhexidine Gluconate Cloth  6 each Topical Q0600   clopidogrel  75 mg Oral Daily   heparin  5,000 Units Subcutaneous Q8H   insulin aspart  0-9 Units Subcutaneous Q6H   latanoprost  1 drop Both Eyes QHS   mupirocin ointment   Nasal BID   rosuvastatin  10 mg Oral Once   rosuvastatin  10 mg Oral QPM   Continuous Infusions:  dextrose 5 % and 0.45% NaCl 100 mL/hr at 08/22/18 1558     LOS: 2 days  Time spent: 47mins    Kathie Dike, MD [facility]   If 7PM-7AM, please contact night-coverage [email or web address]  08/22/2018, 7:22 PM

## 2018-08-22 NOTE — Progress Notes (Signed)
Admit: 08/19/2018 LOS: 32  78 year old female with acute kidney injury, oliguric, after returning home from recent outside hospital admission and resuming ACE inhibitor and nonsteroidals.  Subjective:  Marland Kitchen [employer] in UOP, SCr up futhre nearly 7, K ok, AG 13 and HCO3 17 . MRI ? Small acute infarct, neurology following . Remains nonverbal and not interactive  04/06 0701 - 04/07 0700 In: 1462.1 [I.V.:1414.3; IV Piggyback:47.7] Out: 1900 [Urine:1900]  Filed Weights   08/20/18 0001 08/20/18 0600 08/20/18 1828  Weight: 91.4 kg 89.1 kg 90.7 kg    Scheduled Meds: . Chlorhexidine Gluconate Cloth  6 each Topical Q0600  . clopidogrel  75 mg Oral Daily  . heparin  5,000 Units Subcutaneous Q8H  . insulin aspart  0-9 Units Subcutaneous Q6H  . latanoprost  1 drop Both Eyes QHS  . mupirocin ointment   Nasal BID  . rosuvastatin  10 mg Oral Once  . rosuvastatin  10 mg Oral QPM   Continuous Infusions: . dextrose 5 % and 0.45% NaCl 100 mL/hr at 08/22/18 0508   PRN Meds:.acetaminophen **OR** acetaminophen, nitroGLYCERIN, ondansetron **OR** ondansetron (ZOFRAN) IV  Current Labs: reviewed    Physical Exam:  Blood pressure (!) 163/70, pulse 73, temperature 98.2 F (36.8 C), temperature source Oral, resp. rate 18, height 5\' 5"  (1.651 m), weight 90.7 kg, SpO2 100 %. GEN: Elderly female, nonverbal, not interactive, no acute distress ENT: NCAT EYES: EOMI CV: Regular, normal S1 and S2, no rub PULM: Clear to auscultation bilaterally, normal work of breathing ABD: Soft, nontender SKIN: No rashes, lesions EXT: No peripheral edema  A 1. AKI, oliguric, probably ATN from hemodynamic and nephrotoxin exposures, as I suspect she was hypovolemic while on ACE inhibitor and nonsteroidals; negative ultrasound; urine analysis without hematuria or proteinuria 2. History of multiple CVAs, parkinsonism; nonverbal and dysarthric without significant interaction at baseline 3. Failure to  thrive 4. Hypertension 5. Hyperlipidemia 6. DM 2 7. Polypharmacy, numerous CNS acting medications  P . Cont supportive care . No indication for dialysis, in current state not a long term candidate given CNS and overall health issues . Cont IVFs while taking poor PO . Medication Issues; o Preferred narcotic agents for pain control are hydromorphone, fentanyl, and methadone. Morphine should not be used.  o Baclofen should be avoided o Avoid oral sodium phosphate and magnesium citrate based laxatives / bowel preps    Pearson Grippe MD 08/22/2018, 10:24 AM  Recent Labs  Lab 08/20/18 1307 08/21/18 0814 08/22/18 0452  NA 137 134* 134*  K 3.5 3.4* 3.4*  CL 104 103 105  CO2 21* 19* 16*  GLUCOSE 69* 136* 152*  BUN 42* 42* 44*  CREATININE 5.23* 6.07* 6.94*  CALCIUM 8.3* 7.9* 7.9*  PHOS  --   --  5.1*   Recent Labs  Lab 08/20/18 0023 08/21/18 0814  WBC 6.8 7.9  NEUTROABS 4.3  --   HGB 12.1 10.5*  HCT 37.9 32.6*  MCV 89.0 88.1  PLT 229 212

## 2018-08-23 LAB — RENAL FUNCTION PANEL
Albumin: 2.5 g/dL — ABNORMAL LOW (ref 3.5–5.0)
Anion gap: 13 (ref 5–15)
BUN: 44 mg/dL — ABNORMAL HIGH (ref 8–23)
CO2: 17 mmol/L — ABNORMAL LOW (ref 22–32)
Calcium: 8.2 mg/dL — ABNORMAL LOW (ref 8.9–10.3)
Chloride: 107 mmol/L (ref 98–111)
Creatinine, Ser: 7.06 mg/dL — ABNORMAL HIGH (ref 0.44–1.00)
GFR calc Af Amer: 6 mL/min — ABNORMAL LOW (ref >60)
GFR calc non Af Amer: 5 mL/min — ABNORMAL LOW (ref >60)
Glucose, Bld: 178 mg/dL — ABNORMAL HIGH (ref 70–99)
Phosphorus: 5.7 mg/dL — ABNORMAL HIGH (ref 2.5–4.6)
Potassium: 3.1 mmol/L — ABNORMAL LOW (ref 3.5–5.1)
Sodium: 137 mmol/L (ref 135–145)

## 2018-08-23 LAB — GLUCOSE, CAPILLARY
Glucose-Capillary: 111 mg/dL — ABNORMAL HIGH (ref 70–99)
Glucose-Capillary: 121 mg/dL — ABNORMAL HIGH (ref 70–99)
Glucose-Capillary: 122 mg/dL — ABNORMAL HIGH (ref 70–99)
Glucose-Capillary: 160 mg/dL — ABNORMAL HIGH (ref 70–99)
Glucose-Capillary: 161 mg/dL — ABNORMAL HIGH (ref 70–99)
Glucose-Capillary: 163 mg/dL — ABNORMAL HIGH (ref 70–99)
Glucose-Capillary: 178 mg/dL — ABNORMAL HIGH (ref 70–99)

## 2018-08-23 LAB — AMMONIA: Ammonia: 25 umol/L (ref 9–35)

## 2018-08-23 NOTE — Progress Notes (Signed)
Mobility Note  Patient Details  Name: Susan Patel MRN: [id number] Date of Birth: May 24, 1940 Today's Date: 08/23/2018   Therapist arrived with pt laying crossways in bed, bed mobility to improve positioning for comfort in bed with mod A and PROM/AAROM exercises for LE mobility complete.  Pt left in bed with call bell within reach.   Ihor Austin, [place]; CBIS [phone] 08/23/2018 11:45-12:00  Aldona Lento 08/23/2018, 11:59 AM

## 2018-08-23 NOTE — Evaluation (Addendum)
Physical Therapy Evaluation Patient Details Name: Susan Patel MRN: [id number] DOB: November 12, 1940 Today's Date: 08/23/2018   History of Present Illness  78 year old female with history of chronic systolic heart failure with ejection fraction 40%, hypertension, diabetes, prior CVA with resultant dysarthria, was recently discharged from [facility] after being treated for left cerebellar stroke.  Since returning home, patient remained nonambulatory.  Patient's husband noted that she was increasingly confused and lethargic.  She was brought to the emergency room where she was noted to have acute kidney injury.  She was taking NSAIDs and lisinopril prior to admission.  Nephrology following.  Further work-up with repeat MRI indicated possible new infarct.  Neurology is also been consulted.  Clinical Impression  Susan Patel was unable to verbalize anything to me besides her name.  There was minimal assistance in exercises, although, occasionally you could tell that she was attempting to assist.  Bed mobility was maximal and it will take two people to safely attempt to sit her up on the side of the bed.  Susan. Patel will need continued skilled physical therapy to return her to her prior level of function.     Follow Up Recommendations SNF    Equipment Recommendations  None recommended by PT  If pt family is going to take pt home recommend a hoyer lift, wheelchair and bedside commode.   Recommendations for Other Services OT consult     Precautions / Restrictions Precautions Precautions: Fall      Mobility  Bed Mobility Overal bed mobility: Needs Assistance Bed Mobility: Rolling Rolling: Total assist                              Pertinent Vitals/Pain Pain Assessment: Faces Faces Pain Scale: Hurts a little bit Pain Location: when moving joints presume this is due to stiffness Pain Intervention(s): Limited activity within patient's tolerance    Home Living Family/patient  expects to be discharged to:: Skilled nursing facility                      Prior Function           Comments: unknown as pt is not verbalizing anything besides her name at this time         Extremity/Trunk Assessment        Lower Extremity Assessment Lower Extremity Assessment: Difficult to assess due to impaired cognition       Communication   Communication: Expressive difficulties  Cognition Arousal/Alertness: Lethargic   Overall Cognitive Status: No family/caregiver present to determine baseline cognitive functioning                                              Exercises General Exercises - Lower Extremity Ankle Circles/Pumps: PROM;10 reps;Both Hip ABduction/ADduction: PROM;Both;5 reps;Supine Hip Flexion/Marching: PROM;Both;5 reps;Supine   Assessment/Plan    PT Assessment Patient needs continued PT services  PT Problem List Decreased strength;Obesity;Decreased mobility       PT Treatment Interventions Functional mobility training;Therapeutic exercise    PT Goals (Current goals can be found in the Care Plan section)  Acute Rehab PT Goals PT Goal Formulation: Patient unable to participate in goal setting Potential to Achieve Goals: Fair    Frequency Min 3X/week   Barriers to discharge  mobility  AM-PAC PT "6 Clicks" Mobility  Outcome Measure Help needed turning from your back to your side while in a flat bed without using bedrails?: Total Help needed moving from lying on your back to sitting on the side of a flat bed without using bedrails?: Total Help needed moving to and from a bed to a chair (including a wheelchair)?: Total Help needed standing up from a chair using your arms (e.g., wheelchair or bedside chair)?: Total Help needed to walk in hospital room?: Total Help needed climbing 3-5 steps with a railing? : Total 6 Click Score: 6    End of Session   Activity Tolerance: Treatment limited secondary to  medical complications (Comment) Patient left: in bed;with bed alarm set Nurse Communication: Mobility status PT Visit Diagnosis: Hemiplegia and hemiparesis;Muscle weakness (generalized) (M62.81) Hemiplegia - Right/Left: Right    Time: 3716-9678 PT Time Calculation (min) (ACUTE ONLY): 37 min   Charges:   PT Evaluation $PT Eval Low Complexity: [place], PT CLT [phone] 08/23/2018, 3:37 PM

## 2018-08-23 NOTE — Progress Notes (Signed)
Admit: 08/19/2018 LOS: 55  78 year old female with acute kidney injury, oliguric, after returning home from recent outside hospital admission and resuming ACE inhibitor and nonsteroidals.  Subjective:  . Stable UOP, SCr slightly worsened, K 3.1 HCO3 17 . No sig change in mental status . Cont on maint IVFs  04/07 0701 - 04/08 0700 In: 2514 [P.O.:50; I.V.:2464] Out: 3600 [Urine:3600]  Filed Weights   08/20/18 0001 08/20/18 0600 08/20/18 1828  Weight: 91.4 kg 89.1 kg 90.7 kg    Scheduled Meds: . Chlorhexidine Gluconate Cloth  6 each Topical Q0600  . clopidogrel  75 mg Oral Daily  . heparin  5,000 Units Subcutaneous Q8H  . insulin aspart  0-9 Units Subcutaneous Q6H  . latanoprost  1 drop Both Eyes QHS  . mupirocin ointment   Nasal BID  . rosuvastatin  10 mg Oral Once  . rosuvastatin  10 mg Oral QPM   Continuous Infusions: . dextrose 5 % and 0.45% NaCl 100 mL/hr at 08/23/18 0039   PRN Meds:.acetaminophen **OR** acetaminophen, nitroGLYCERIN, ondansetron **OR** ondansetron (ZOFRAN) IV  Current Labs: reviewed    Physical Exam:  Blood pressure (!) 166/72, pulse 65, temperature 98.4 F (36.9 C), temperature source Oral, resp. rate 18, height 5\' 5"  (1.651 m), weight 90.7 kg, SpO2 94 %. GEN: Elderly female, nonverbal, not interactive, no acute distress ENT: NCAT EYES: EOMI CV: Regular, normal S1 and S2, no rub PULM: Clear to auscultation bilaterally, normal work of breathing ABD: Soft, nontender SKIN: No rashes, lesions EXT: No peripheral edema  A 1. AKI, nonliguric now, probably ATN from hemodynamic and nephrotoxin exposures, as I suspect she was hypovolemic while on ACE inhibitor and nonsteroidals; negative ultrasound; urine analysis without hematuria or proteinuria 2. History of multiple CVAs, parkinsonism; nonverbal and dysarthric without significant interaction at baseline 3. Failure to thrive 4. Hypertension 5. Hyperlipidemia 6. DM 2 7. Polypharmacy, numerous CNS acting  medications; held  P . Cont supportive care . No indication for dialysis, in current state not a long term candidate given CNS and overall health issues . Cont IVFs while taking inadequate PO . Ongoing goals of care discussion with husband as able . Medication Issues; o Preferred narcotic agents for pain control are hydromorphone, fentanyl, and methadone. Morphine should not be used.  o Baclofen should be avoided o Avoid oral sodium phosphate and magnesium citrate based laxatives / bowel preps    Pearson Grippe MD 08/23/2018, 9:08 AM  Recent Labs  Lab 08/21/18 0814 08/22/18 0452 08/23/18 0520  NA 134* 134* 137  K 3.4* 3.4* 3.1*  CL 103 105 107  CO2 19* 16* 17*  GLUCOSE 136* 152* 178*  BUN 42* 44* 44*  CREATININE 6.07* 6.94* 7.06*  CALCIUM 7.9* 7.9* 8.2*  PHOS  --  5.1* 5.7*   Recent Labs  Lab 08/20/18 0023 08/21/18 0814  WBC 6.8 7.9  NEUTROABS 4.3  --   HGB 12.1 10.5*  HCT 37.9 32.6*  MCV 89.0 88.1  PLT 229 212

## 2018-08-23 NOTE — Progress Notes (Signed)
  Speech Language Pathology Treatment: Dysphagia  Patient Details Name: Susan Patel MRN: [id number] DOB: [birth year] Today's Date: 08/23/2018 Time: 8295-6213 SLP Time Calculation (min) (ACUTE ONLY): 12 min  Assessment / Plan / Recommendation Clinical Impression  Self care/diet check completed as CNA was feeding Pt lunch meal. Pt with much improved alertness at this time with open eyes and occasional "yes" and "Ok" responses. Pt with slow oral prep and tolerating cup/straw sips of NTL with 100% feeder assist. Continue diet as ordered and SLP to follow for upgrades as appropriate.    HPI HPI: Patient was admitted to the hospital with alteration in mental status.  She was recently discharged from hospital at [facility] after being evaluated for acute cerebral infarct.  At that time, skilled nurse facility placement was recommended, but patient's husband elected to take her home.  Patient had been essentially bedbound since her discharge.  She was brought back to the hospital with increasing lethargy and dysarthria.  Initial CT scan of the head was unrevealing.  Further work-up including basic chemistry showed that she has a new acute kidney injury.  She is on several medications which are renally cleared and could also be contributing to confusion.  CK level was checked and found to be normal.  Renal ultrasound has been requested.  Urine output has been poor.  Will continue on IV fluids.  She was taking ACE inhibitor's and nonsteroidal anti-inflammatories which likely contributed to her renal failure.  If renal function fails to improve by tomorrow, can consider nephrology input. MRI shows: Possible small acute infarct, LEFT frontal subcortical white matter. Atrophy and small vessel disease. Chronic RIGHT hemisphere and BILATERAL basal ganglia infarcts. BSE requested. Pt known to SLP from previous outpatient SLP therapy. Pt has had extensive SLP therapy at [facility], [facility] and [facility] for dysphagia,  dysarthria, cognition, and dysphonia (bilateral vocal fold atrophy, muscle tension dysphonia). Her last objective assessment (MBSS) was completed 10/19/2016 with recommendation for P/HTL. Pt has been consuming a regular diet with thin liquids at home for the past couple of years. She endorses occasional "coughing" when she drinks too fast      SLP Plan  Continue with current plan of care       Recommendations  Diet recommendations: Dysphagia 1 (puree);Nectar-thick liquid Liquids provided via: Cup;Straw Medication Administration: Whole meds with puree Supervision: Staff to assist with self feeding;Full supervision/cueing for compensatory strategies Compensations: Small sips/bites;Slow rate;Lingual sweep for clearance of pocketing Postural Changes and/or Swallow Maneuvers: Seated upright 90 degrees;Upright 30-60 min after meal                Oral Care Recommendations: Oral care BID;Staff/trained caregiver to provide oral care Follow up Recommendations: Skilled Nursing facility SLP Visit Diagnosis: Dysphagia, unspecified (R13.10) Plan: Continue with current plan of care       Thank you,  Genene Churn, Kemp                 [place] 08/23/2018, 12:43 PM

## 2018-08-23 NOTE — Progress Notes (Signed)
PROGRESS NOTE    Susan Patel  ZOX:[id number] DOB: 18-Jan-1941 DOA: 08/19/2018 PCP: Dettinger, Fransisca Kaufmann, MD    Brief Narrative:  78 year old female with history of chronic systolic heart failure with ejection fraction 40%, hypertension, diabetes, prior CVA with resultant dysarthria, was recently discharged from [facility] after being treated for left cerebellar stroke.  Since returning home, patient remained nonambulatory.  Patient's husband noted that she was increasingly confused and lethargic.  She was brought to the emergency room where she was noted to have acute kidney injury.  She was taking NSAIDs and lisinopril prior to admission.  Nephrology following.  Further work-up with repeat MRI indicated possible new infarct.  Neurology is also been consulted.   Assessment & Plan:   Principal Problem:   Acute encephalopathy Active Problems:   HTN (hypertension)   Type 2 diabetes mellitus with peripheral neuropathy (HCC)   Chronic pain syndrome   Chronic obstructive pulmonary disease (HCC)   Dysarthria, post-stroke   AKI (acute kidney injury) (HCC)   Mixed hyperlipidemia   Altered mental status   Chronic systolic CHF (congestive heart failure) (HCC)   Cerebrovascular disease   1. Acute encephalopathy.  Likely multifactorial in the setting of renal failure, polypharmacy.  MRI shows acute infarct, but per neurology, this would be unlikely to contribute to encephalopathy.  ABG does not show hypercapnia.  Ammonia is normal.  Continue to monitor mental status.  No signs of infectious process at this time. 2. Acute CVA.  She was recently discharged from [facility] after being treated for posterior circulation strokes.  She is found to have an acute small in the cortical left frontal subcortical white matter on MRI brain. She is chronically on aspirin and Plavix.  She recently had a full stroke work-up during her last hospitalization. 3. Dysarthria, post stroke.  Seen by speech therapy to  assess safety of swallowing.  Started on D1/pure with nectar thick liquids. 4. Acute kidney injury.  Likely related to NSAIDs and ACE inhibitors taken prior to admission.  Nephrology following.  Continue supportive management, follow renal function and urine output.  Renal ultrasound is unrevealing.  She had approximately 3.6 L of urine output yesterday.  Creatinine continues to rise, although is hopefully plateauing.  Continue to monitor 5. Diabetes.  Chronically on oral medications.  This is currently on hold.  Continue sliding scale insulin as needed.  Blood sugars have been stable 6. Hyperlipidemia.  Continue statin 7. Chronic systolic congestive heart failure.  Ejection fraction 40%.  Monitor volume status as she is being hydrated for renal function. 8. COPD.  No evidence of wheezing.  She wears oxygen at night.  Continue current treatments   DVT prophylaxis: Heparin Code Status: DNR Family Communication: Discussed with husband over the phone Disposition Plan: Discussed with husband and he is not interested in skilled nursing facility placement.  Wishes to take the patient home on discharge   Consultants:   Nephrology  Neurology  Procedures:     Antimicrobials:   Zosyn 4/5>4/6   Subjective: At the time my visit speech is difficult to comprehend.  Unable to participate in conversation.  Discussed with husband over the phone who said that patient's sister had spoken to her earlier today and felt that her speech was starting to clear.  Objective: Vitals:   08/23/18 0430 08/23/18 0830 08/23/18 1230 08/23/18 1630  BP: (!) 166/72 (!) 181/76 (!) 189/83 (!) 159/87  Pulse: 65 73 76 75  Resp: 18 16 17  19  Temp: 98.4 F (36.9 C) 98 F (36.7 C) 97.7 F (36.5 C) 97.8 F (36.6 C)  TempSrc: Oral Oral Oral Oral  SpO2: 94% 97% 99% 98%  Weight:      Height:        Intake/Output Summary (Last 24 hours) at 08/23/2018 1745 Last data filed at 08/23/2018 1500 Gross per 24 hour  Intake  3513.92 ml  Output 2400 ml  Net 1113.92 ml   Filed Weights   08/20/18 0001 08/20/18 0600 08/20/18 1828  Weight: 91.4 kg 89.1 kg 90.7 kg    Examination:  General exam: Alert, awake, no distress Respiratory system: Clear to auscultation. Respiratory effort normal. Cardiovascular system:RRR. No murmurs, rubs, gallops. Gastrointestinal system: Abdomen is nondistended, soft and nontender. No organomegaly or masses felt. Normal bowel sounds heard. Central nervous system: Speech is difficult to comprehend. No focal neurological deficits. Extremities: No C/C/E, +pedal pulses Skin: No rashes, lesions or ulcers Psychiatry: Does not participate in conversation.    Data Reviewed: I have personally reviewed following labs and imaging studies  CBC: Recent Labs  Lab 08/20/18 0023 08/21/18 0814  WBC 6.8 7.9  NEUTROABS 4.3  --   HGB 12.1 10.5*  HCT 37.9 32.6*  MCV 89.0 88.1  PLT 229 323   Basic Metabolic Panel: Recent Labs  Lab 08/20/18 0023 08/20/18 1307 08/21/18 0814 08/22/18 0452 08/23/18 0520  NA 135 137 134* 134* 137  K 3.9 3.5 3.4* 3.4* 3.1*  CL 99 104 103 105 107  CO2 22 21* 19* 16* 17*  GLUCOSE 182* 69* 136* 152* 178*  BUN 37* 42* 42* 44* 44*  CREATININE 4.61* 5.23* 6.07* 6.94* 7.06*  CALCIUM 9.0 8.3* 7.9* 7.9* 8.2*  PHOS  --   --   --  5.1* 5.7*   GFR: Estimated Creatinine Clearance: 7.7 mL/min (A) (by C-G formula based on SCr of 7.06 mg/dL (H)). Liver Function Tests: Recent Labs  Lab 08/20/18 0023 08/21/18 0814 08/22/18 0452 08/23/18 0520  AST 26 19  --   --   ALT 27 19  --   --   ALKPHOS 85 70  --   --   BILITOT 0.9 0.8  --   --   PROT 6.8 5.3*  --   --   ALBUMIN 3.4* 2.5* 2.5* 2.5*   No results for input(s): LIPASE, AMYLASE in the last 168 hours. Recent Labs  Lab 08/23/18 0520  AMMONIA 25   Coagulation Profile: Recent Labs  Lab 08/20/18 0023  INR 1.0   Cardiac Enzymes: Recent Labs  Lab 08/20/18 1307  CKTOTAL 182   BNP (last 3 results)  No results for input(s): PROBNP in the last 8760 hours. HbA1C: No results for input(s): HGBA1C in the last 72 hours. CBG: Recent Labs  Lab 08/23/18 0427 08/23/18 0556 08/23/18 0726 08/23/18 1116 08/23/18 1628  GLUCAP 160* 163* 161* 121* 178*   Lipid Profile: No results for input(s): CHOL, HDL, LDLCALC, TRIG, CHOLHDL, LDLDIRECT in the last 72 hours. Thyroid Function Tests: No results for input(s): TSH, T4TOTAL, FREET4, T3FREE, THYROIDAB in the last 72 hours. Anemia Panel: No results for input(s): VITAMINB12, FOLATE, FERRITIN, TIBC, IRON, RETICCTPCT in the last 72 hours. Sepsis Labs: Recent Labs  Lab 08/20/18 0023 08/20/18 0320  LATICACIDVEN 1.6 1.6    Recent Results (from the past 240 hour(s))  Blood culture (routine x 2)     Status: None (Preliminary result)   Collection Time: 08/20/18 12:08 AM  Result Value Ref Range Status   Specimen Description  BLOOD RIGHT HAND  Final   Special Requests   Final    BOTTLES DRAWN AEROBIC AND ANAEROBIC Blood Culture adequate volume   Culture   Final    NO GROWTH 3 DAYS Performed at [facility], [address] [place], [place] [address]    Report Status PENDING  Incomplete  Blood culture (routine x 2)     Status: None (Preliminary result)   Collection Time: 08/20/18 12:24 AM  Result Value Ref Range Status   Specimen Description RIGHT ANTECUBITAL  Final   Special Requests   Final    BOTTLES DRAWN AEROBIC AND ANAEROBIC Blood Culture adequate volume   Culture   Final    NO GROWTH 3 DAYS Performed at [facility], [address], [place], [place] [address]    Report Status PENDING  Incomplete  Culture, Urine     Status: Abnormal   Collection Time: 08/20/18  2:28 AM  Result Value Ref Range Status   Specimen Description   Final    URINE, CLEAN CATCH Performed at [facility], [address], [place], [place] [address]    Special Requests   Final    NONE Performed at [facility], [address] [place], [place] [address]     Culture (A)  Final    <10,000 COLONIES/mL INSIGNIFICANT GROWTH Performed at [facility], [place] [address] [place], [place] [address]    Report Status 08/21/2018 FINAL  Final  MRSA PCR Screening     Status: Abnormal   Collection Time: 08/20/18  6:08 AM  Result Value Ref Range Status   MRSA by PCR POSITIVE (A) NEGATIVE Final    Comment:        The GeneXpert MRSA Assay (FDA approved for NASAL specimens only), is one component of a comprehensive MRSA colonization surveillance program. It is not intended to diagnose MRSA infection nor to guide or monitor treatment for MRSA infections. RESULT CALLED TO, READ BACK BY AND VERIFIED WITH: STONE,S AT 1336 ON 08/20/2018 BY JPM Performed at [facility], [address], [place],  [address]          Radiology Studies: No results found.      Scheduled Meds: . Chlorhexidine Gluconate Cloth  6 each Topical Q0600  . clopidogrel  75 mg Oral Daily  . heparin  5,000 Units Subcutaneous Q8H  . insulin aspart  0-9 Units Subcutaneous Q6H  . latanoprost  1 drop Both Eyes QHS  . mupirocin ointment   Nasal BID  . rosuvastatin  10 mg Oral Once  . rosuvastatin  10 mg Oral QPM   Continuous Infusions: . dextrose 5 % and 0.45% NaCl 100 mL/hr at 08/23/18 0039     LOS: 3 days    Time spent: 60mins    Kathie Dike, MD [facility]   If 7PM-7AM, please contact night-coverage [email or web address]  08/23/2018, 5:45 PM

## 2018-08-23 NOTE — TOC Initial Note (Signed)
Transition of Care [facility]) - Initial/Assessment Note    Patient Details  Name: Susan Patel MRN: [id number] Date of Birth: November 16, 1940  [facility]) CM/SW Contact:    Sherald Barge, RN Phone Number: 08/23/2018, 4:03 PM  Clinical Narrative:    CM spoke with husband regarding DC planning. Pt has large CVA 2 years ago. Has had a total of 2 large CVA's and one small. Pt ambulated with RW pta. She has a chair lift to get her up the stairs. Per husband their home is "pre much handicap accessible". Pt does not have WC. Pt was DC'd from [place] last Thursday, HH was supposed to be set up but no one ever called. Husband has been providing care for past two years. She assists with bathing, dressing, ambulation. She can feed herself most of the time. CM discussed PT eval done today. Told husband two people were needed for bed mobility. He says he had been doing that himself pta. CM will contact husband again tomorrow to discuss tomorrow's PT eval and HH/ DME provider options.   Pt's home address is [place].                Expected Discharge Plan: [place]     Expected Discharge Plan and Services Expected Discharge Plan: [place] Acute Care Choice: Durable Medical Equipment, Home Health Living arrangements for the past 2 months: Single Family Home                 DME Arranged: Wheelchair manual   HH Arranged: RN, PT, Speech Therapy    Prior Living Arrangements/Services Living arrangements for the past 2 months: Single Family Home Lives with:: Spouse Patient language and need for interpreter reviewed:: Yes Do you feel safe going back to the place where you live?: Yes      Need for Family Participation in Patient Care: Yes (Comment) Care giver support system in place?: Yes (comment) Current home services: DME Criminal Activity/Legal Involvement Pertinent to Current Situation/Hospitalization: No - Comment as  needed  Activities of Daily Living Home Assistive Devices/Equipment: Environmental consultant (specify type), Wheelchair ADL Screening (condition at time of admission) Patient's cognitive ability adequate to safely complete daily activities?: No Is the patient deaf or have difficulty hearing?: No Does the patient have difficulty seeing, even when wearing glasses/contacts?: No Does the patient have difficulty concentrating, remembering, or making decisions?: Yes Patient able to express need for assistance with ADLs?: No Does the patient have difficulty dressing or bathing?: No Independently performs ADLs?: No Communication: Independent Dressing (OT): Dependent Is this a change from baseline?: Pre-admission baseline Grooming: Dependent Is this a change from baseline?: Pre-admission baseline Feeding: Needs assistance Is this a change from baseline?: Pre-admission baseline Bathing: Dependent Is this a change from baseline?: Pre-admission baseline Toileting: Needs assistance Is this a change from baseline?: Pre-admission baseline In/Out Bed: Dependent Is this a change from baseline?: Pre-admission baseline Walks in Home: Dependent Is this a change from baseline?: Pre-admission baseline Does the patient have difficulty walking or climbing stairs?: Yes Weakness of Legs: Both Weakness of Arms/Hands: Both  Emotional Assessment Appearance:: Appears stated age   Affect (typically observed): Appropriate Orientation: : Fluctuating Orientation (Suspected and/or reported [place]owners)      Admission diagnosis:  Acute renal insufficiency [N28.9] Unresponsive episode [R41.89] Hypothermia, initial encounter [T68.XXXA] Patient Active Problem List   Diagnosis Date Noted  . Chronic systolic CHF (congestive heart  failure) ([place]) 08/21/2018  . Cerebrovascular disease 08/21/2018  . Acute encephalopathy 08/21/2018  . Altered mental status 08/20/2018  . Pulmonary nodules 06/07/2018  . Coronary artery disease   .  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 History of stroke 04/04/2017  . 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 Contusion of left elbow 03/15/2017  . Dysmetria   . Supplemental oxygen dependent   . Hypoalbuminemia due to protein-calorie malnutrition ([place])   . Oropharyngeal dysphagia   . 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 Frequent falls 04/21/2016  . Type 2 diabetes mellitus with peripheral neuropathy (HCC)   . Fibromyalgia   . Chronic pain syndrome   . Chronic obstructive pulmonary disease ([place])   . Gait disturbance, post-stroke   . Dysarthria, post-stroke   . Dysphagia, post-stroke   . AKI (acute kidney injury) ([place])   . Mixed hyperlipidemia   . 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  . Allergic rhinitis 12/17/2015  . Primary osteoarthritis of both knees 11/19/2015  . Obesity (BMI 30-39.9) 09/04/2015  . HTN (hypertension) 09/04/2015  . Constipation 09/04/2015  . Raynaud's phenomenon 08/26/2015  . Gastro-esophageal reflux disease with esophagitis 08/26/2015  . Spinal stenosis 08/26/2015  . Vitamin D deficiency 08/26/2015  . DDD (degenerative disc disease), cervical 07/10/2015  . Cervical radicular pain 04/30/2013  . Knee osteoarthritis 01/05/2012   PCP:  Dettinger, Fransisca Kaufmann, MD Pharmacy:   [id number] - [place], [place] [address] Phone: [phone] Fax: [phone]  [place], [place] [address] Phone: [phone] Fax: [phone]    Readmission Risk Interventions No flowsheet data found.

## 2018-08-24 LAB — GLUCOSE, CAPILLARY
Glucose-Capillary: 111 mg/dL — ABNORMAL HIGH (ref 70–99)
Glucose-Capillary: 114 mg/dL — ABNORMAL HIGH (ref 70–99)
Glucose-Capillary: 117 mg/dL — ABNORMAL HIGH (ref 70–99)
Glucose-Capillary: 138 mg/dL — ABNORMAL HIGH (ref 70–99)
Glucose-Capillary: 182 mg/dL — ABNORMAL HIGH (ref 70–99)
Glucose-Capillary: 93 mg/dL (ref 70–99)

## 2018-08-24 LAB — RENAL FUNCTION PANEL
Albumin: 2.8 g/dL — ABNORMAL LOW (ref 3.5–5.0)
Anion gap: 16 — ABNORMAL HIGH (ref 5–15)
BUN: 38 mg/dL — ABNORMAL HIGH (ref 8–23)
CO2: 17 mmol/L — ABNORMAL LOW (ref 22–32)
Calcium: 8.5 mg/dL — ABNORMAL LOW (ref 8.9–10.3)
Chloride: 105 mmol/L (ref 98–111)
Creatinine, Ser: 6.35 mg/dL — ABNORMAL HIGH (ref 0.44–1.00)
GFR calc Af Amer: 7 mL/min — ABNORMAL LOW (ref >60)
GFR calc non Af Amer: 6 mL/min — ABNORMAL LOW (ref >60)
Glucose, Bld: 149 mg/dL — ABNORMAL HIGH (ref 70–99)
Phosphorus: 5.2 mg/dL — ABNORMAL HIGH (ref 2.5–4.6)
Potassium: 3.3 mmol/L — ABNORMAL LOW (ref 3.5–5.1)
Sodium: 138 mmol/L (ref 135–145)

## 2018-08-24 MED ORDER — ASPIRIN 81 MG PO CHEW
81.0000 mg | CHEWABLE_TABLET | Freq: Every day | ORAL | Status: DC
  Administered 2018-08-24 – 2018-08-26 (×3): 81 mg via ORAL
  Filled 2018-08-24 (×3): qty 1

## 2018-08-24 MED ORDER — CARVEDILOL 3.125 MG PO TABS
6.2500 mg | ORAL_TABLET | Freq: Two times a day (BID) | ORAL | Status: DC
  Administered 2018-08-24 (×2): 6.25 mg via ORAL
  Filled 2018-08-24 (×3): qty 2

## 2018-08-24 MED ORDER — POTASSIUM CHLORIDE CRYS ER 20 MEQ PO TBCR
20.0000 meq | EXTENDED_RELEASE_TABLET | Freq: Once | ORAL | Status: AC
  Administered 2018-08-24: 20 meq via ORAL
  Filled 2018-08-24: qty 1

## 2018-08-24 MED ORDER — LACTATED RINGERS IV SOLN
INTRAVENOUS | Status: DC
  Administered 2018-08-24 (×2): via INTRAVENOUS

## 2018-08-24 NOTE — Progress Notes (Signed)
Speech Language Pathology Treatment: Dysphagia  Patient Details Name: Susan Patel MRN: [id number] DOB: 1940/11/21 Today's Date: 08/24/2018 Time: 3762-8315 SLP Time Calculation (min) (ACUTE ONLY): 22 min  Assessment / Plan / Recommendation Clinical Impression  Pt seen for ongoing diagnostic dysphagia intervention and seen at bedside for trials of advanced textures and consistencies. Pt again with improved alertness this date, however verbalizations are again limited. Pt assessed with ice chips, thin water via cup, NTL via straw, regular textures, and mechanical soft textures. Susan Patel required moderate verbal and tactile cues for hand over hand assist for self feeding and cues to close lips around the cup. Pt with occasional labial spillage with thin liquids and immediate and delayed dry coughing with ice chips and thin liquids. Pt masticated regular textures and mech soft textures, but then let bolus sit in oral cavity. Current diet recommendation continues to be appropriate (D1/puree and NTL straw ok, po meds whole in puree), however prognosis for advancement is good with improved alertness. SLP called and spoke with Susan Patel on the phone as he plans to take her home instead of SNF. He is familiar with thickening liquids, as she had to do this in the past. Recommend HH SLP to follow for dysphagia and diet advancement and dysarthria as this appears worse than baseline at this time.     HPI HPI: Patient was admitted to the hospital with alteration in mental status.  She was recently discharged from hospital at [facility] after being evaluated for acute cerebral infarct.  At that time, skilled nurse facility placement was recommended, but patient's Patel elected to take her home.  Patient had been essentially bedbound since her discharge.  She was brought back to the hospital with increasing lethargy and dysarthria.  Initial CT scan of the head was unrevealing.  Further work-up including basic  chemistry showed that she has a new acute kidney injury.  She is on several medications which are renally cleared and could also be contributing to confusion.  CK level was checked and found to be normal.  Renal ultrasound has been requested.  Urine output has been poor.  Will continue on IV fluids.  She was taking ACE inhibitor's and nonsteroidal anti-inflammatories which likely contributed to her renal failure.  If renal function fails to improve by tomorrow, can consider nephrology input. MRI shows: Possible small acute infarct, LEFT frontal subcortical white matter. Atrophy and small vessel disease. Chronic RIGHT hemisphere and BILATERAL basal ganglia infarcts. BSE requested. Pt known to SLP from previous outpatient SLP therapy. Pt has had extensive SLP therapy at [facility], [facility], and [facility] for dysphagia, dysarthria, cognition, and dysphonia (bilateral vocal fold atrophy, muscle tension dysphonia). Her last objective assessment (MBSS) was completed 10/19/2016 with recommendation for P/HTL. Pt has been consuming a regular diet with thin liquids at home for the past couple of years. She endorses occasional "coughing" when she drinks too fast      SLP Plan  Continue with current plan of care       Recommendations  Diet recommendations: Dysphagia 1 (puree);Nectar-thick liquid Liquids provided via: Cup;Straw Medication Administration: Whole meds with puree Supervision: Staff to assist with self feeding;Full supervision/cueing for compensatory strategies Compensations: Small sips/bites;Slow rate;Lingual sweep for clearance of pocketing Postural Changes and/or Swallow Maneuvers: Seated upright 90 degrees;Upright 30-60 min after meal                Oral Care Recommendations: Oral care BID;Staff/trained caregiver to provide oral care Follow up  Recommendations: Skilled Nursing facility SLP Visit Diagnosis: Dysphagia, unspecified (R13.10) Plan: Continue with current plan of care        Thank you,  Susan Patel, [place]                 [place] 08/24/2018, 1:03 PM

## 2018-08-24 NOTE — Progress Notes (Signed)
PT Cancellation Note  Patient Details Name: Susan Patel MRN: [id number] DOB: Apr 07, 1941   Cancelled Treatment:    Reason Eval/Treat Not Completed: Fatigue/lethargy limiting ability to participate  Attempted PT session.  NT in room reports she just completed bath and pt very relaxed.  Pt unable to open eyes, very lethargic.  Will attempt PT session later today if available. [address], LPTA; [place]  Aldona Lento 08/24/2018, 12:03 PM

## 2018-08-24 NOTE — TOC Progression Note (Signed)
Transition of Care [facility]) - Progression Note    Patient Details  Name: Susan Patel MRN: [id number] Date of Birth: 01-24-1941  Transition of Care [facility]) CM/SW Contact  [name], RN Phone Number: 08/24/2018, 1:14 PM  Clinical Narrative:   CM spoke with husband. Unable to work with PT this AM. Husband has chsoen [employer] and [facility] for DME and [facility] services. Needs WC and hoyer lift. Blake Divine, [employer] rep, given referral. Will request DME delivered to pt home prior to DC. HH referral given to [place], [facility] rep.     Expected Discharge Plan: [place]    Expected Discharge Plan and Services Expected Discharge Plan: [place] Choice: Durable Medical Equipment, Home Health Living arrangements for the past 2 months: Single Family Home                 DME Arranged: Wheelchair manual, Other see comment(hoyer lift) DME Agency: [employer] HH Arranged: RN, PT, Speech Therapy, Refused SNF [place] Agency: [place] ([facility])   Social Determinants of Health (SDOH) Interventions    Readmission Risk Interventions No flowsheet data found.

## 2018-08-24 NOTE — Progress Notes (Signed)
PROGRESS NOTE    Susan Patel  IOX:[id number] DOB: 1941-04-24 DOA: 08/19/2018 PCP: Dettinger, Fransisca Kaufmann, MD    Brief Narrative:  78 year old female with history of chronic systolic heart failure with ejection fraction 40%, hypertension, diabetes, prior CVA with resultant dysarthria, was recently discharged from [facility] after being treated for left cerebellar stroke.  Since returning home, patient remained nonambulatory.  Patient's husband noted that she was increasingly confused and lethargic.  She was brought to the emergency room where she was noted to have acute kidney injury.  She was taking NSAIDs and lisinopril prior to admission.  Nephrology following.  Further work-up with repeat MRI indicated new cortical infarct.  Seen by neurology and it was felt that this new infarct was not contributing to her mental status changes.  Currently, she is being treated for her renal failure.  Mental status slowly improving.   Assessment & Plan:   Principal Problem:   Acute encephalopathy Active Problems:   HTN (hypertension)   Type 2 diabetes mellitus with peripheral neuropathy (HCC)   Chronic pain syndrome   Chronic obstructive pulmonary disease (HCC)   Dysarthria, post-stroke   AKI (acute kidney injury) (HCC)   Mixed hyperlipidemia   Altered mental status   Chronic systolic CHF (congestive heart failure) (HCC)   Cerebrovascular disease   1. Acute encephalopathy.  Likely multifactorial in the setting of renal failure, polypharmacy.  MRI shows acute infarct, but per neurology, this would be unlikely to contribute to encephalopathy.  ABG does not show hypercapnia.  Ammonia is normal.  Continue to monitor mental status.  No signs of infectious process at this time. 2. Acute CVA.  She was recently discharged from [facility] after being treated for posterior circulation strokes.  She is found to have an acute small in the cortical left frontal subcortical white matter on MRI brain. She is  chronically on aspirin and Plavix.  She recently had a full stroke work-up during her last hospitalization. 3. Dysarthria, post stroke.  Seen by speech therapy to assess safety of swallowing.  Started on D1/pure with nectar thick liquids. 4. Acute kidney injury.  Likely related to NSAIDs and ACE inhibitors taken prior to admission.  Nephrology following.  Continue supportive management, follow renal function and urine output.  Renal ultrasound is unrevealing.  She had approximately 2.9 L of urine output yesterday.  Mild improvement of creatinine to 6.3.  Continue to monitor 5. Hypertension.  Uncontrolled.  Restarted on home dose of Coreg today.  Continue to monitor 6. Diabetes.  Chronically on oral medications.  This is currently on hold.  Continue sliding scale insulin as needed.  Blood sugars have been stable 7. Hyperlipidemia.  Continue statin 8. Chronic systolic congestive heart failure.  Ejection fraction 40%.  Monitor volume status as she is being hydrated for renal function. 9. COPD.  No evidence of wheezing.  She wears oxygen at night.  Continue current treatments   DVT prophylaxis: Heparin Code Status: DNR Family Communication: Discussed with husband over the phone Disposition Plan: Discussed with husband and he is not interested in skilled nursing facility placement.  Wishes to take the patient home on discharge   Consultants:   Nephrology  Neurology  Procedures:     Antimicrobials:   Zosyn 4/5>4/6   Subjective: Patient answers yes and no to simple questions.  Speech is still somewhat difficult to comprehend.  Objective: Vitals:   08/24/18 0417 08/24/18 0814 08/24/18 0816 08/24/18 1459  BP: (!) 178/83 (!) 192/82 Marland Kitchen)  192/82 (!) 177/78  Pulse: 77 71 75 77  Resp: 18 18 18 16   Temp: 97.9 F (36.6 C) 98.9 F (37.2 C) 98.9 F (37.2 C)   TempSrc: Oral Axillary Oral   SpO2: 100% 98% 98% 98%  Weight:      Height:        Intake/Output Summary (Last 24 hours) at  08/24/2018 1637 Last data filed at 08/24/2018 1505 Gross per 24 hour  Intake 1767.68 ml  Output 2400 ml  Net -632.32 ml   Filed Weights   08/20/18 0001 08/20/18 0600 08/20/18 1828  Weight: 91.4 kg 89.1 kg 90.7 kg    Examination:  General exam: Alert, awake, no distress Respiratory system: Clear to auscultation. Respiratory effort normal. Cardiovascular system:RRR. No murmurs, rubs, gallops. Gastrointestinal system: Abdomen is nondistended, soft and nontender. No organomegaly or masses felt. Normal bowel sounds heard. Central nervous system: No focal neurological deficits. Extremities: No C/C/E, +pedal pulses Skin: No rashes, lesions or ulcers Psychiatry: Answers yes and no to simple questions    Data Reviewed: I have personally reviewed following labs and imaging studies  CBC: Recent Labs  Lab 08/20/18 0023 08/21/18 0814  WBC 6.8 7.9  NEUTROABS 4.3  --   HGB 12.1 10.5*  HCT 37.9 32.6*  MCV 89.0 88.1  PLT 229 465   Basic Metabolic Panel: Recent Labs  Lab 08/20/18 1307 08/21/18 0814 08/22/18 0452 08/23/18 0520 08/24/18 0605  NA 137 134* 134* 137 138  K 3.5 3.4* 3.4* 3.1* 3.3*  CL 104 103 105 107 105  CO2 21* 19* 16* 17* 17*  GLUCOSE 69* 136* 152* 178* 149*  BUN 42* 42* 44* 44* 38*  CREATININE 5.23* 6.07* 6.94* 7.06* 6.35*  CALCIUM 8.3* 7.9* 7.9* 8.2* 8.5*  PHOS  --   --  5.1* 5.7* 5.2*   GFR: Estimated Creatinine Clearance: 8.5 mL/min (A) (by C-G formula based on SCr of 6.35 mg/dL (H)). Liver Function Tests: Recent Labs  Lab 08/20/18 0023 08/21/18 0814 08/22/18 0452 08/23/18 0520 08/24/18 0605  AST 26 19  --   --   --   ALT 27 19  --   --   --   ALKPHOS 85 70  --   --   --   BILITOT 0.9 0.8  --   --   --   PROT 6.8 5.3*  --   --   --   ALBUMIN 3.4* 2.5* 2.5* 2.5* 2.8*   No results for input(s): LIPASE, AMYLASE in the last 168 hours. Recent Labs  Lab 08/23/18 0520  AMMONIA 25   Coagulation Profile: Recent Labs  Lab 08/20/18 0023  INR 1.0    Cardiac Enzymes: Recent Labs  Lab 08/20/18 1307  CKTOTAL 182   BNP (last 3 results) No results for input(s): PROBNP in the last 8760 hours. HbA1C: No results for input(s): HGBA1C in the last 72 hours. CBG: Recent Labs  Lab 08/23/18 2359 08/24/18 0419 08/24/18 0744 08/24/18 1124 08/24/18 1614  GLUCAP 114* 138* 117* 111* 93   Lipid Profile: No results for input(s): CHOL, HDL, LDLCALC, TRIG, CHOLHDL, LDLDIRECT in the last 72 hours. Thyroid Function Tests: No results for input(s): TSH, T4TOTAL, FREET4, T3FREE, THYROIDAB in the last 72 hours. Anemia Panel: No results for input(s): VITAMINB12, FOLATE, FERRITIN, TIBC, IRON, RETICCTPCT in the last 72 hours. Sepsis Labs: Recent Labs  Lab 08/20/18 0023 08/20/18 0320  LATICACIDVEN 1.6 1.6    Recent Results (from the past 240 hour(s))  Blood culture (routine x  2)     Status: None (Preliminary result)   Collection Time: 08/20/18 12:08 AM  Result Value Ref Range Status   Specimen Description BLOOD RIGHT HAND  Final   Special Requests   Final    BOTTLES DRAWN AEROBIC AND ANAEROBIC Blood Culture adequate volume   Culture   Final    NO GROWTH 4 DAYS Performed at [facility], [address] [place], [place] [address]    Report Status PENDING  Incomplete  Blood culture (routine x 2)     Status: None (Preliminary result)   Collection Time: 08/20/18 12:24 AM  Result Value Ref Range Status   Specimen Description RIGHT ANTECUBITAL  Final   Special Requests   Final    BOTTLES DRAWN AEROBIC AND ANAEROBIC Blood Culture adequate volume   Culture   Final    NO GROWTH 4 DAYS Performed at [facility], [address] [place], [place] [address]    Report Status PENDING  Incomplete  Culture, Urine     Status: Abnormal   Collection Time: 08/20/18  2:28 AM  Result Value Ref Range Status   Specimen Description   Final    URINE, CLEAN CATCH Performed at [facility], [address] [place], [place] [address]    Special Requests   Final     NONE Performed at [facility], [address] [place], [place] [address]    Culture (A)  Final    <10,000 COLONIES/mL INSIGNIFICANT GROWTH Performed at [facility], [place] [address] [place], [place] [address]    Report Status 08/21/2018 FINAL  Final  MRSA PCR Screening     Status: Abnormal   Collection Time: 08/20/18  6:08 AM  Result Value Ref Range Status   MRSA by PCR POSITIVE (A) NEGATIVE Final    Comment:        The GeneXpert MRSA Assay (FDA approved for NASAL specimens only), is one component of a comprehensive MRSA colonization surveillance program. It is not intended to diagnose MRSA infection nor to guide or monitor treatment for MRSA infections. RESULT CALLED TO, READ BACK BY AND VERIFIED WITH: STONE,S AT 1336 ON 08/20/2018 BY JPM Performed at [facility], [address] [place], [place] [address]          Radiology Studies: No results found.      Scheduled Meds: . carvedilol  6.25 mg Oral BID WC  . Chlorhexidine Gluconate Cloth  6 each Topical Q0600  . clopidogrel  75 mg Oral Daily  . heparin  5,000 Units Subcutaneous Q8H  . insulin aspart  0-9 Units Subcutaneous Q6H  . latanoprost  1 drop Both Eyes QHS  . mupirocin ointment   Nasal BID  . rosuvastatin  10 mg Oral Once  . rosuvastatin  10 mg Oral QPM   Continuous Infusions: . lactated ringers 100 mL/hr at 08/24/18 0909     LOS: 4 days    Time spent: 22mins    Kathie Dike, MD [facility]   If 7PM-7AM, please contact night-coverage [email or web address]  08/24/2018, 4:37 PM

## 2018-08-24 NOTE — Progress Notes (Signed)
Patient ID: Susan Patel, female   DOB: 03-03-1941, 78 y.o.   MRN: [id number] [facility] Progress Note   Assessment/ Plan:   1. Acute kidney Injury: Suspected to be hemodynamically mediated in the setting of ongoing NSAID/ACE inhibitor use.  She continues to maintain excellent urine output and renal function now appearing to show improvement.  Will switch to LR from D5/half NS for the next 20 hours (she has history of systolic congestive heart failure however, appears compensated at this time and fluids being given due to limited/negligible oral intake).  No acute indications for dialysis.  Not a candidate for chronic dialysis based on current mental status. 2.  Hypertension: Elevated blood pressures noted overnight, will restart carvedilol 6.25 mg twice daily that she was taking prior to admission.  Continue to hold lisinopril/RAS blockers in the setting of AKI. 3.  Hypokalemia: Secondary to limited intake in the setting of excellent urine output.  Will switch intravenous fluids to lactated Ringers and supplement oral potassium. 4.  Acute encephalopathy: Suspected to be multifactorial in the setting of acute kidney injury/polypharmacy-concern raised with her low albumin level and possibly micronutrient deficiency contributing to this as well. 5.  CVA: Status post recent CVA work-up with MRI evidence of acute CVA.  Restart beta-blockers for blood pressure control.  On statin and clopidogrel. 6.  Hyperphosphatemia: Mild, monitor with improving renal function.  No pharmacotherapy at this time.  Subjective:   Per nurse at bedside, possibly the slightest improvement of mental status overnight.   Objective:   BP (!) 192/82 (BP Location: Right Leg)   Pulse 75   Temp 98.9 F (37.2 C) (Oral)   Resp 18   Ht 5\' 5"  (1.651 m)   Wt 90.7 kg   SpO2 98%   BMI 33.27 kg/m   Intake/Output Summary (Last 24 hours) at 08/24/2018 0901 Last data filed at 08/24/2018 0526 Gross per 24 hour  Intake  2668.92 ml  Output 2900 ml  Net -231.08 ml   Weight change:   Physical Exam: Gen: Appears to be comfortable resting in bed, delayed/minimal verbal responses CVS: Pulse regular rhythm, normal rate, S1 and S2 normal Resp: Poor inspiratory effort, no distinct rales or rhonchi Abd: Soft, obese, nontender, bowel sounds normal Ext: No lower extremity edema  Imaging: No results found.  Labs: BMET Recent Labs  Lab 08/20/18 0023 08/20/18 1307 08/21/18 0814 08/22/18 0452 08/23/18 0520 08/24/18 0605  NA 135 137 134* 134* 137 138  K 3.9 3.5 3.4* 3.4* 3.1* 3.3*  CL 99 104 103 105 107 105  CO2 22 21* 19* 16* 17* 17*  GLUCOSE 182* 69* 136* 152* 178* 149*  BUN 37* 42* 42* 44* 44* 38*  CREATININE 4.61* 5.23* 6.07* 6.94* 7.06* 6.35*  CALCIUM 9.0 8.3* 7.9* 7.9* 8.2* 8.5*  PHOS  --   --   --  5.1* 5.7* 5.2*   CBC Recent Labs  Lab 08/20/18 0023 08/21/18 0814  WBC 6.8 7.9  NEUTROABS 4.3  --   HGB 12.1 10.5*  HCT 37.9 32.6*  MCV 89.0 88.1  PLT 229 212   Medications:    . carvedilol  6.25 mg Oral BID WC  . Chlorhexidine Gluconate Cloth  6 each Topical Q0600  . clopidogrel  75 mg Oral Daily  . heparin  5,000 Units Subcutaneous Q8H  . insulin aspart  0-9 Units Subcutaneous Q6H  . latanoprost  1 drop Both Eyes QHS  . mupirocin ointment   Nasal BID  . potassium  chloride  20 mEq Oral Once  . rosuvastatin  10 mg Oral Once  . rosuvastatin  10 mg Oral QPM   Elmarie Shiley, MD 08/24/2018, 9:01 AM

## 2018-08-24 NOTE — Care Management (Signed)
Patient suffers from CVA which impairs their ability to perform daily activities like ambulating in the home. A walker will not resolve  issue with performing activities of daily living. A wheelchair will allow patient to safely perform daily activities. Patient can safely propel the wheelchair in the home or has a caregiver who can provide assistance.

## 2018-08-25 LAB — GLUCOSE, CAPILLARY
Glucose-Capillary: 96 mg/dL (ref 70–99)
Glucose-Capillary: 98 mg/dL (ref 70–99)
Glucose-Capillary: 120 mg/dL — ABNORMAL HIGH (ref 70–99)
Glucose-Capillary: 166 mg/dL — ABNORMAL HIGH (ref 70–99)
Glucose-Capillary: 94 mg/dL (ref 70–99)
Glucose-Capillary: 113 mg/dL — ABNORMAL HIGH (ref 70–99)

## 2018-08-25 LAB — CULTURE, BLOOD (ROUTINE X 2)
Culture: NO GROWTH
Culture: NO GROWTH
Special Requests: ADEQUATE
Special Requests: ADEQUATE

## 2018-08-25 LAB — RENAL FUNCTION PANEL
Albumin: 2.7 g/dL — ABNORMAL LOW (ref 3.5–5.0)
Anion gap: 12 (ref 5–15)
BUN: 36 mg/dL — ABNORMAL HIGH (ref 8–23)
CO2: 20 mmol/L — ABNORMAL LOW (ref 22–32)
Calcium: 8.6 mg/dL — ABNORMAL LOW (ref 8.9–10.3)
Chloride: 110 mmol/L (ref 98–111)
Creatinine, Ser: 5.23 mg/dL — ABNORMAL HIGH (ref 0.44–1.00)
GFR calc Af Amer: 9 mL/min — ABNORMAL LOW (ref >60)
GFR calc non Af Amer: 7 mL/min — ABNORMAL LOW (ref >60)
Glucose, Bld: 121 mg/dL — ABNORMAL HIGH (ref 70–99)
Phosphorus: 4.3 mg/dL (ref 2.5–4.6)
Potassium: 3.4 mmol/L — ABNORMAL LOW (ref 3.5–5.1)
Sodium: 142 mmol/L (ref 135–145)

## 2018-08-25 LAB — MAGNESIUM: Magnesium: 1.7 mg/dL (ref 1.7–2.4)

## 2018-08-25 MED ORDER — CARVEDILOL 12.5 MG PO TABS
12.5000 mg | ORAL_TABLET | Freq: Two times a day (BID) | ORAL | Status: DC
  Administered 2018-08-25 – 2018-08-26 (×3): 12.5 mg via ORAL
  Filled 2018-08-25 (×3): qty 1

## 2018-08-25 MED ORDER — POTASSIUM CHLORIDE 20 MEQ PO PACK
40.0000 meq | PACK | Freq: Once | ORAL | Status: AC
  Administered 2018-08-25: 40 meq via ORAL
  Filled 2018-08-25: qty 2

## 2018-08-25 MED ORDER — HYDRALAZINE HCL 20 MG/ML IJ SOLN: 10.0000 mg | INTRAMUSCULAR | Status: DC | PRN

## 2018-08-25 NOTE — Progress Notes (Signed)
Patient ID: Susan Patel, female   DOB: 05-Jul-1940, 78 y.o.   MRN: [id number] [facility] Progress Note   Assessment/ Plan:   1. Acute kidney Injury: Suspected to be hemodynamically mediated in the setting of ongoing NSAID/ACE inhibitor use.  With decent urine output overnight and continued renal recovery seen on labs earlier this morning.  Currently without intravenous access and will need encouragement for oral intake-if unsatisfactory, needs continued maintenance intravenous fluid support at 50-75 cc/h (preferably LR).  With renal recovery underway, will sign off and remain available for questions. 2.  Hypertension: Blood pressure remains elevated in spite of restarting carvedilol, increased dose to 12.5 mg twice daily. 3.  Hypokalemia: Secondary to limited intake in the setting of preserved urine output-ordered oral supplemental dose. 4.  Acute encephalopathy: Suspected to be multifactorial in the setting of acute kidney injury/polypharmacy-concern raised with her low albumin level and possibly micronutrient deficiency contributing to this as well. 5.  CVA: Status post recent CVA work-up with MRI evidence of acute CVA.  On beta-blocker for blood pressure control.  On statin and clopidogrel. 6.  Hyperphosphatemia: Mild, and now appears to be improving with recovering renal function.  Subjective:   Noted to have pulled out her IV access earlier this morning-charting indicates that she may not have had prescribed dose of intravenous fluids overnight.   Objective:   BP (!) 183/75 (BP Location: Right Arm)   Pulse 79   Temp 98.8 F (37.1 C) (Oral)   Resp 18   Ht 5\' 5"  (1.651 m)   Wt 90.7 kg   SpO2 99%   BMI 33.27 kg/m   Intake/Output Summary (Last 24 hours) at 08/25/2018 0904 Last data filed at 08/25/2018 0559 Gross per 24 hour  Intake 398.67 ml  Output 1450 ml  Net -1051.33 ml   Weight change:   Physical Exam: Gen: Appears to be comfortable resting in bed, no clear  verbal response to questions CVS: Pulse regular rhythm, normal rate, S1 and S2 normal Resp: Poor inspiratory effort, no distinct rales or rhonchi Abd: Soft, obese, nontender, bowel sounds normal Ext: No lower extremity edema  Imaging: No results found.  Labs: BMET Recent Labs  Lab 08/20/18 0023 08/20/18 1307 08/21/18 1448 08/22/18 0452 08/23/18 0520 08/24/18 0605 08/25/18 0511  NA 135 137 134* 134* 137 138 142  K 3.9 3.5 3.4* 3.4* 3.1* 3.3* 3.4*  CL 99 104 103 105 107 105 110  CO2 22 21* 19* 16* 17* 17* 20*  GLUCOSE 182* 69* 136* 152* 178* 149* 121*  BUN 37* 42* 42* 44* 44* 38* 36*  CREATININE 4.61* 5.23* 6.07* 6.94* 7.06* 6.35* 5.23*  CALCIUM 9.0 8.3* 7.9* 7.9* 8.2* 8.5* 8.6*  PHOS  --   --   --  5.1* 5.7* 5.2* 4.3   CBC Recent Labs  Lab 08/20/18 0023 08/21/18 0814  WBC 6.8 7.9  NEUTROABS 4.3  --   HGB 12.1 10.5*  HCT 37.9 32.6*  MCV 89.0 88.1  PLT 229 212   Medications:    . aspirin  81 mg Oral Daily  . carvedilol  6.25 mg Oral BID WC  . Chlorhexidine Gluconate Cloth  6 each Topical Q0600  . clopidogrel  75 mg Oral Daily  . heparin  5,000 Units Subcutaneous Q8H  . insulin aspart  0-9 Units Subcutaneous Q6H  . latanoprost  1 drop Both Eyes QHS  . mupirocin ointment   Nasal BID  . rosuvastatin  10 mg Oral Once  .  rosuvastatin  10 mg Oral QPM   Elmarie Shiley, MD 08/25/2018, 9:04 AM

## 2018-08-25 NOTE — Progress Notes (Signed)
PROGRESS NOTE Susan Patel  CVE:[id number]  DOB: 07/09/1940  DOA: 08/19/2018 PCP: Dettinger, Fransisca Kaufmann, MD  Brief Admission Hx: 78 year old female with hypertension, diabetes, cerebrovascular disease with resulting dysarthria, chronic systolic heart failure presented with acute kidney injury and encephalopathy.  MDM/Assessment & Plan:   1. Acute metabolic encephalopathy-likely secondary to renal failure and polypharmacy-patient is clinically improving.  She is still somewhat confused but reportedly has been improving.  Continuing to monitor. 2. Recent acute CVA- recently discharged from [facility] [place].  She is on Plavix and aspirin.  She was found to have an acute small infarct in the cortical left frontal subcortical white matter on MRI. 3. Acute kidney injury- suspect secondary to dehydration, NSAIDs and ACE inhibitors.  Nephrology has seen her and subsequently signed off.  She was treated with IV fluids and her renal function is improving.  Her renal ultrasound was unrevealing.  Her creatinine is down to 5 now.  Monitoring. 4. Dysarthria- secondary to recent CVA-started on day 1/pured diet with nectar thick liquids.  Speech therapy has evaluated. 5. Poorly controlled essential hypertension-he has been restarted on carvedilol and nephrology team has increased to 12.5 mg twice daily. 6. Type 2 diabetes mellitus-she has been treated with supplemental sliding scale coverage.  Follow CBG. 7. Diabetic dyslipidemia- continue statin therapy as ordered. 8. Chronic systolic congestive heart failure- her EF is reported to be 40%.  Currently stable.  Watch fluids. 9. COPD- has been stable on home oxygen which she wears at night.  Bronchodilator as needed.  DVT prophylaxis: Heparin Code Status: DNR Family Communication: Husband (telephone) Disposition Plan: Husband wants to take home, not interested in SNF placement, planning DC 08/26/18  Consultants:  Nephrology  Neurology  Procedures:     Antimicrobials:  Zosyn 4/5-4/6  Subjective: Patient remains dysarthric but able to answer yes and no questions.  Denies any current complaints.  Her urine output has been very good.  She pulled out her IV and it was not restarted.  Objective: Vitals:   08/24/18 0816 08/24/18 1459 08/24/18 2153 08/25/18 0526  BP: (!) 192/82 (!) 177/78 (!) 173/68 (!) 183/75  Pulse: 75 77 72 79  Resp: 18 16 18 18   Temp: 98.9 F (37.2 C)  99.5 F (37.5 C) 98.8 F (37.1 C)  TempSrc: Oral  Oral Oral  SpO2: 98% 98% 98% 99%  Weight:      Height:        Intake/Output Summary (Last 24 hours) at 08/25/2018 1027 Last data filed at 08/25/2018 0559 Gross per 24 hour  Intake 348.67 ml  Output 1450 ml  Net -1101.33 ml   Filed Weights   08/20/18 0001 08/20/18 0600 08/20/18 1828  Weight: 91.4 kg 89.1 kg 90.7 kg   REVIEW OF SYSTEMS  As per history otherwise all reviewed and reported negative  Exam:  General exam: Elderly female awake alert in no distress.  Dysarthric.  Blinks yes and no. Respiratory system: Clear. No increased work of breathing. Cardiovascular system: Normal S1 & S2 heard. No JVD. Gastrointestinal system: Abdomen is nondistended, soft and nontender. Normal bowel sounds heard. Central nervous system: Alert and oriented. No focal neurological deficits. Extremities: no cyanosis or clubbing.  Data Reviewed: Basic Metabolic Panel: Recent Labs  Lab 08/21/18 0814 08/22/18 0452 08/23/18 0520 08/24/18 0605 08/25/18 0511  NA 134* 134* 137 138 142  K 3.4* 3.4* 3.1* 3.3* 3.4*  CL 103 105 107 105 110  CO2 19* 16* 17* 17* 20*  GLUCOSE 136* 152*  178* 149* 121*  BUN 42* 44* 44* 38* 36*  CREATININE 6.07* 6.94* 7.06* 6.35* 5.23*  CALCIUM 7.9* 7.9* 8.2* 8.5* 8.6*  MG  --   --   --   --  1.7  PHOS  --  5.1* 5.7* 5.2* 4.3   Liver Function Tests: Recent Labs  Lab 08/20/18 0023 08/21/18 0814 08/22/18 0452 08/23/18 0520 08/24/18 0605 08/25/18 0511  AST 26 19  --   --   --   --    ALT 27 19  --   --   --   --   ALKPHOS 85 70  --   --   --   --   BILITOT 0.9 0.8  --   --   --   --   PROT 6.8 5.3*  --   --   --   --   ALBUMIN 3.4* 2.5* 2.5* 2.5* 2.8* 2.7*   No results for input(s): LIPASE, AMYLASE in the last 168 hours. Recent Labs  Lab 08/23/18 0520  AMMONIA 25   CBC: Recent Labs  Lab 08/20/18 0023 08/21/18 0814  WBC 6.8 7.9  NEUTROABS 4.3  --   HGB 12.1 10.5*  HCT 37.9 32.6*  MCV 89.0 88.1  PLT 229 212   Cardiac Enzymes: Recent Labs  Lab 08/20/18 1307  CKTOTAL 182   CBG (last 3)  Recent Labs    08/25/18 0006 08/25/18 0405 08/25/18 0731  GLUCAP 98 96 120*   Recent Results (from the past 240 hour(s))  Blood culture (routine x 2)     Status: None   Collection Time: 08/20/18 12:08 AM  Result Value Ref Range Status   Specimen Description BLOOD RIGHT HAND  Final   Special Requests   Final    BOTTLES DRAWN AEROBIC AND ANAEROBIC Blood Culture adequate volume   Culture   Final    NO GROWTH 5 DAYS Performed at [facility], [address], [place], [place] [address]    Report Status 08/25/2018 FINAL  Final  Blood culture (routine x 2)     Status: None   Collection Time: 08/20/18 12:24 AM  Result Value Ref Range Status   Specimen Description RIGHT ANTECUBITAL  Final   Special Requests   Final    BOTTLES DRAWN AEROBIC AND ANAEROBIC Blood Culture adequate volume   Culture   Final    NO GROWTH 5 DAYS Performed at [facility], [address], [place], [place] [address]    Report Status 08/25/2018 FINAL  Final  Culture, Urine     Status: Abnormal   Collection Time: 08/20/18  2:28 AM  Result Value Ref Range Status   Specimen Description   Final    URINE, CLEAN CATCH Performed at [facility], [address] [place], [place] [address]    Special Requests   Final    NONE Performed at [facility], [address], [place], [place] [address]    Culture (A)  Final    <10,000 COLONIES/mL INSIGNIFICANT GROWTH Performed at [facility], [place] [address], [place], [place] [address]    Report Status 08/21/2018 FINAL  Final  MRSA PCR Screening     Status: Abnormal   Collection Time: 08/20/18  6:08 AM  Result Value Ref Range Status   MRSA by PCR POSITIVE (A) NEGATIVE Final    Comment:        The GeneXpert MRSA Assay (FDA approved for NASAL specimens only), is one component of a comprehensive MRSA  colonization surveillance program. It is not intended to diagnose MRSA infection nor to guide or monitor treatment for MRSA infections. RESULT CALLED TO, READ BACK BY AND VERIFIED WITH: STONE,S AT 1336 ON 08/20/2018 BY JPM Performed at [facility], [address] [place], [place] [address]     Studies: No results found.   Scheduled Meds: . aspirin  81 mg Oral Daily  . carvedilol  12.5 mg Oral BID WC  . Chlorhexidine Gluconate Cloth  6 each Topical Q0600  . clopidogrel  75 mg Oral Daily  . heparin  5,000 Units Subcutaneous Q8H  . insulin aspart  0-9 Units Subcutaneous Q6H  . latanoprost  1 drop Both Eyes QHS  . mupirocin ointment   Nasal BID  . rosuvastatin  10 mg Oral Once  . rosuvastatin  10 mg Oral QPM   Continuous Infusions:  Principal Problem:   Acute encephalopathy Active Problems:   HTN (hypertension)   Type 2 diabetes mellitus with peripheral neuropathy (HCC)   Chronic pain syndrome   Chronic obstructive pulmonary disease (HCC)   Dysarthria, post-stroke   AKI (acute kidney injury) ([place])   Mixed hyperlipidemia   Altered mental status   Chronic systolic CHF (congestive heart failure) ([place])   Cerebrovascular disease  Time spent:   Irwin Brakeman, MD [facility] 08/25/2018, 10:27 AM    LOS: 5 days  How to contact the [facility] Attending or Consulting provider [place] or covering provider during after hours [place], for this patient?  1. Check the care team in [facility] and look for a) attending/consulting [facility] provider listed and b) the [facility] team listed 2. Log into [email or web address] and use [place]'s  universal password to access. If you do not have the password, please contact the hospital operator. 3. Locate the [facility] provider you are looking for under [facility] and page to a number that you can be directly reached. 4. If you still have difficulty reaching the provider, please page the [facility] (Director on Call) for the Hospitalists listed on amion for assistance.

## 2018-08-25 NOTE — Progress Notes (Signed)
Physical Therapy Treatment Patient Details Name: Susan Patel MRN: [id number] DOB: Jan 22, 1941 Today's Date: 08/25/2018    History of Present Illness 78 year old female with history of chronic systolic heart failure with ejection fraction 40%, hypertension, diabetes, prior CVA with resultant dysarthria, was recently discharged from [facility] after being treated for left cerebellar stroke.  Since returning home, patient remained nonambulatory.  Patient's husband noted that she was increasingly confused and lethargic.  She was brought to the emergency room where she was noted to have acute kidney injury.  She was taking NSAIDs and lisinopril prior to admission.  Nephrology following.  Further work-up with repeat MRI indicated possible new infarct.  Neurology is also been consulted.    PT Comments    Patient demonstrates much improvement for following instructions and participating with therapy today. Patient had difficulty with sitting up at bedside due to generalized weakness requiring assistance for sitting up/scooting forward, improved BLE for sit to stands, taking steps using RW without loss of balance and tolerated sitting up in chair after therapy - RN aware.  Patient will benefit from continued physical therapy in hospital and recommended venue below to increase strength, balance, endurance for safe ADLs and gait.   Follow Up Recommendations  SNF;Supervision for mobility/OOB;Supervision - Intermittent     Equipment Recommendations       Recommendations for Other Services       Precautions / Restrictions Precautions Precautions: Fall Restrictions Weight Bearing Restrictions: No    Mobility  Bed Mobility Overal bed mobility: Needs Assistance Bed Mobility: Supine to Sit     Supine to sit: Mod assist     General bed mobility comments: slow labored movement with difficulty using BUE to scoot self forward  Transfers Overall transfer level: Needs assistance Equipment  used: Rolling walker (2 wheeled) Transfers: Sit to/from [facility] Sit to Stand: Min assist Stand pivot transfers: Min assist;Mod assist       General transfer comment: slow labored movement  Ambulation/Gait Ambulation/Gait assistance: Mod assist Gait Distance (Feet): 5 Feet Assistive device: Rolling walker (2 wheeled) Gait Pattern/deviations: Decreased step length - right;Decreased step length - left;Decreased stride length Gait velocity: slow   General Gait Details: limited to 5-6 slow unsteady labored short steps at bedside with frequent verbal/tactile cueing for safety, limited secondary to fatigue   Stairs             Wheelchair Mobility    Modified Rankin (Stroke Patients Only)       Balance Overall balance assessment: Needs assistance Sitting-balance support: Feet supported;No upper extremity supported Sitting balance-Leahy Scale: Fair Sitting balance - Comments: fair/good with BUE support   Standing balance support: Bilateral upper extremity supported;During functional activity Standing balance-Leahy Scale: Fair Standing balance comment: using RW                            Cognition Arousal/Alertness: Awake/alert Behavior During Therapy: WFL for tasks assessed/performed Overall Cognitive Status: No family/caregiver present to determine baseline cognitive functioning                                 General Comments: able to follow directions consistently      Exercises General Exercises - Lower Extremity Ankle Circles/Pumps: Seated;Strengthening;AROM;Both;10 reps Long Arc Quad: Seated;Strengthening;AROM;Both;10 reps Hip Flexion/Marching: Seated;AROM;Strengthening;Both;10 reps    General Comments        Pertinent Vitals/Pain Pain  Assessment: No/denies pain    Home Living                      Prior Function            PT Goals (current goals can now be found in the care plan section)  Acute Rehab PT Goals PT Goal Formulation: With patient Potential to Achieve Goals: Good Progress towards PT goals: Progressing toward goals    Frequency    Min 3X/week      PT Plan Current plan remains appropriate    Co-evaluation              AM-PAC PT "6 Clicks" Mobility   Outcome Measure  Help needed turning from your back to your side while in a flat bed without using bedrails?: A Lot Help needed moving from lying on your back to sitting on the side of a flat bed without using bedrails?: A Lot Help needed moving to and from a bed to a chair (including a wheelchair)?: A Lot Help needed standing up from a chair using your arms (e.g., wheelchair or bedside chair)?: A Lot Help needed to walk in hospital room?: A Lot Help needed climbing 3-5 steps with a railing? : Total 6 Click Score: 11    End of Session Equipment Utilized During Treatment: Gait belt Activity Tolerance: Patient tolerated treatment well;Patient limited by fatigue Patient left: in chair;with call bell/phone within reach;with chair alarm set Nurse Communication: Mobility status PT Visit Diagnosis: Unsteadiness on feet (R26.81);Other abnormalities of gait and mobility (R26.89);Muscle weakness (generalized) (M62.81) Hemiplegia - Right/Left: Right     Time: 9233-0076 PT Time Calculation (min) (ACUTE ONLY): 32 min  Charges:  $Therapeutic Exercise: 8-22 mins $Therapeutic Activity: 8-22 mins                     11:01 AM, 08/25/18 Lonell Grandchild, MPT Physical Therapist with [facility] 336 [phone] office [phone] mobile phone

## 2018-08-25 NOTE — Progress Notes (Signed)
Pt pulled IV out. Attempted to restart with access. MD paged and orders given to d/c IV.

## 2018-08-25 NOTE — Progress Notes (Signed)
Patient has been up with therapy and is sitting up in the chair. Patient has voiced no complaints and shown no signs of complaints. Patient has been helped with meals and medications have been crushed and put in applesauce and tolerated well. Patient has spoken to husband on telephone this morning.

## 2018-08-25 NOTE — Care Management Important Message (Signed)
Important Message  Patient Details  Name: Susan Patel MRN: [id number] Date of Birth: [birth year]   Medicare Important Message Given:  Yes    [name] 08/25/2018, 3:02 PM

## 2018-08-26 ENCOUNTER — Other Ambulatory Visit: Payer: Self-pay | Admitting: Family Medicine

## 2018-08-26 DIAGNOSIS — J441 Chronic obstructive pulmonary disease with (acute) exacerbation: Secondary | ICD-10-CM

## 2018-08-26 LAB — GLUCOSE, CAPILLARY
Glucose-Capillary: 120 mg/dL — ABNORMAL HIGH (ref 70–99)
Glucose-Capillary: 133 mg/dL — ABNORMAL HIGH (ref 70–99)
Glucose-Capillary: 137 mg/dL — ABNORMAL HIGH (ref 70–99)
Glucose-Capillary: 187 mg/dL — ABNORMAL HIGH (ref 70–99)

## 2018-08-26 LAB — RENAL FUNCTION PANEL
Albumin: 2.9 g/dL — ABNORMAL LOW (ref 3.5–5.0)
Anion gap: 12 (ref 5–15)
BUN: 34 mg/dL — ABNORMAL HIGH (ref 8–23)
CO2: 23 mmol/L (ref 22–32)
Calcium: 8.8 mg/dL — ABNORMAL LOW (ref 8.9–10.3)
Chloride: 107 mmol/L (ref 98–111)
Creatinine, Ser: 4.18 mg/dL — ABNORMAL HIGH (ref 0.44–1.00)
GFR calc Af Amer: 11 mL/min — ABNORMAL LOW (ref >60)
GFR calc non Af Amer: 10 mL/min — ABNORMAL LOW (ref >60)
Glucose, Bld: 125 mg/dL — ABNORMAL HIGH (ref 70–99)
Phosphorus: 3.8 mg/dL (ref 2.5–4.6)
Potassium: 3.1 mmol/L — ABNORMAL LOW (ref 3.5–5.1)
Sodium: 142 mmol/L (ref 135–145)

## 2018-08-26 MED ORDER — ROSUVASTATIN CALCIUM 10 MG PO TABS: 10.0000 mg | ORAL_TABLET | Freq: Every evening | ORAL | 0 refills | Status: DC

## 2018-08-26 MED ORDER — ASPIRIN 81 MG PO CHEW: 81.0000 mg | CHEWABLE_TABLET | Freq: Every day | ORAL | Status: DC

## 2018-08-26 MED ORDER — MONTELUKAST SODIUM 10 MG PO TABS: 10.0000 mg | ORAL_TABLET | Freq: Every evening | ORAL | 3 refills | Status: DC | PRN

## 2018-08-26 MED ORDER — ISOSORBIDE MONONITRATE ER 30 MG PO TB24: 15.0000 mg | ORAL_TABLET | Freq: Every day | ORAL | 3 refills | Status: DC

## 2018-08-26 MED ORDER — TRAZODONE HCL 50 MG PO TABS: 25.0000 mg | ORAL_TABLET | Freq: Every evening | ORAL | 3 refills | Status: DC | PRN

## 2018-08-26 MED ORDER — CARVEDILOL 12.5 MG PO TABS: 12.5000 mg | ORAL_TABLET | Freq: Two times a day (BID) | ORAL | 0 refills | Status: DC

## 2018-08-26 MED ORDER — FAMOTIDINE 10 MG PO TABS: 10.0000 mg | ORAL_TABLET | Freq: Every day | ORAL | Status: DC | PRN

## 2018-08-26 MED ORDER — LATANOPROST 0.005 % OP SOLN: 1.0000 [drp] | Freq: Every day | OPHTHALMIC | 0 refills | Status: DC

## 2018-08-26 NOTE — Progress Notes (Signed)
Patient fed, bathed and spoke to husband during shift.  Patient speech is still very garbled at times and patient weak but shows no signs of pain or distress.

## 2018-08-26 NOTE — Discharge Instructions (Signed)
Please avoid all NSAID medications including ibuprofen, Motrin, Aleve, Advil and Goody powder. Please hold aspirin for at least 1 week before restarting. Please follow-up with specialists and primary care provider as recommended. Please check blood sugar at least once per day and give results to primary care provider.  Acute Kidney Injury, Adult  Acute kidney injury is a sudden worsening of kidney function. The kidneys are organs that have several jobs. They filter the blood to remove waste products and extra fluid. They also maintain a healthy balance of minerals and hormones in the body, which helps control blood pressure and keep bones strong. With this condition, your kidneys do not do their jobs as well as they should. This condition ranges from mild to severe. Over time it may develop into long-lasting (chronic) kidney disease. Early detection and treatment may prevent acute kidney injury from developing into a chronic condition. What are the causes? Common causes of this condition include:  A problem with blood flow to the kidneys. This may be caused by: ? Low blood pressure (hypotension) or shock. ? Blood loss. ? Heart and blood vessel (cardiovascular) disease. ? Severe burns. ? Liver disease.  Direct damage to the kidneys. This may be caused by: ? Certain medicines. ? A kidney infection. ? Poisoning. ? Being around or in contact with toxic substances. ? A surgical wound. ? A hard, direct hit to the kidney area.  A sudden blockage of urine flow. This may be caused by: ? Cancer. ? Kidney stones. ? An enlarged prostate in males. What are the signs or symptoms? Symptoms of this condition may not be obvious until the condition becomes severe. Symptoms of this condition can include:  Tiredness (lethargy), or difficulty staying awake.  Nausea or vomiting.  Swelling (edema) of the face, legs, ankles, or feet.  Problems with urination, such as: ? Abdominal pain, or pain along  the side of your stomach (flank). ? Decreased urine production. ? Decrease in the force of urine flow.  Muscle twitches and cramps, especially in the legs.  Confusion or trouble concentrating.  Loss of appetite.  Fever. How is this diagnosed? This condition may be diagnosed with tests, including:  Blood tests.  Urine tests.  Imaging tests.  A test in which a sample of tissue is removed from the kidneys to be examined under a microscope (kidney biopsy). How is this treated? Treatment for this condition depends on the cause and how severe the condition is. In mild cases, treatment may not be needed. The kidneys may heal on their own. In more severe cases, treatment will involve:  Treating the cause of the kidney injury. This may involve changing any medicines you are taking or adjusting your dosage.  Fluids. You may need specialized IV fluids to balance your body's needs.  Having a catheter placed to drain urine and prevent blockages.  Preventing problems from occurring. This may mean avoiding certain medicines or procedures that can cause further injury to the kidneys. In some cases treatment may also require:  A procedure to remove toxic wastes from the body (dialysis or continuous renal replacement therapy - CRRT).  Surgery. This may be done to repair a torn kidney, or to remove the blockage from the urinary system. Follow these instructions at home: Medicines  Take over-the-counter and prescription medicines only as told by your health care provider.  Do not take any new medicines without your health care provider's approval. Many medicines can worsen your kidney damage.  Do not take  any vitamin and mineral supplements without your health care provider's approval. Many nutritional supplements can worsen your kidney damage. Lifestyle  If your health care provider prescribed changes to your diet, follow them. You may need to decrease the amount of protein you  eat.  Achieve and maintain a healthy weight. If you need help with this, ask your health care provider.  Start or continue an exercise plan. Try to exercise at least 30 minutes a day, 5 days a week.  Do not use any tobacco products, such as cigarettes, chewing tobacco, and e-cigarettes. If you need help quitting, ask your health care provider. General instructions  Keep track of your blood pressure. Report changes in your blood pressure as told by your health care provider.  Stay up to date with immunizations. Ask your health care provider which immunizations you need.  Keep all follow-up visits as told by your health care provider. This is important. Where to find more information  American Association of Kidney Patients: BombTimer.gl  National Kidney Foundation: www.kidney.Gates: https://mathis.com/  [facility]: ? www.lifeoptions.org ? www.kidneyschool.org Contact a health care provider if:  Your symptoms get worse.  You develop new symptoms. Get help right away if:  You develop symptoms of worsening kidney disease, which include: ? Headaches. ? Abnormally dark or light skin. ? Easy bruising. ? Frequent hiccups. ? Chest pain. ? Shortness of breath. ? End of menstruation in women. ? Seizures. ? Confusion or altered mental status. ? Abdominal or back pain. ? Itchiness.  You have a fever.  Your body is producing less urine.  You have pain or bleeding when you urinate. Summary  Acute kidney injury is a sudden worsening of kidney function.  Acute kidney injury can be caused by problems with blood flow to the kidneys, direct damage to the kidneys, and sudden blockage of urine flow.  Symptoms of this condition may not be obvious until it becomes severe. Symptoms may include edema, lethargy, confusion, nausea or vomiting, and problems passing urine.  This condition can usually be diagnosed with blood tests, urine tests, and  imaging tests. Sometimes a kidney biopsy is done to diagnose this condition.  Treatment for this condition often involves treating the underlying cause. It is treated with fluids, medicines, dialysis, diet changes, or surgery. This information is not intended to replace advice given to you by your health care provider. Make sure you discuss any questions you have with your health care provider. Document Released: 11/16/2010 Document Revised: 09/02/2016 Document Reviewed: 04/23/2016 Elsevier Interactive Patient Education  2019 [employer].

## 2018-08-26 NOTE — Progress Notes (Signed)
Nsg Discharge Note  Admit Date:  08/19/2018 Discharge date: 08/26/2018   Susan Patel to be D/C'd Home per MD order.  AVS completed.  Copy for chart, and copy for patient signed, and dated. Patient caregiver Susan Patel able to verbalize understanding.  Discharge Medication: Allergies as of 08/26/2018      Reactions   Azithromycin Other (See Comments)   Renal failure   Prednisone Other (See Comments)   Irritability and insomnia   Sulfamethoxazole-trimethoprim Other (See Comments)   Renal failure   Amlodipine Swelling   Caused swelling in legs   Ciprofloxacin Itching, Rash   Tape Rash      Medication List    STOP taking these medications   cefdinir 300 MG capsule Commonly known as:  OMNICEF   cetirizine 10 MG tablet Commonly known as:  ZYRTEC   DULoxetine 30 MG capsule Commonly known as:  CYMBALTA   glimepiride 2 MG tablet Commonly known as:  AMARYL   linaclotide 290 MCG Caps capsule Commonly known as:  Linzess   nabumetone 500 MG tablet Commonly known as:  RELAFEN   tolterodine 1 MG tablet Commonly known as:  DETROL   traMADol 50 MG tablet Commonly known as:  ULTRAM   triamcinolone 0.1 % cream : eucerin Crea   triamcinolone cream 0.1 % Commonly known as:  KENALOG     TAKE these medications   acetaminophen 325 MG tablet Commonly known as:  Tylenol Take 2 tablets (650 mg total) by mouth every 6 (six) hours as needed. What changed:  reasons to take this   albuterol 108 (90 Base) MCG/ACT inhaler Commonly known as:  PROVENTIL HFA;VENTOLIN HFA Inhale 1-2 puffs into the lungs every 6 (six) hours as needed for wheezing or shortness of breath.   aspirin 81 MG chewable tablet Chew 1 tablet (81 mg total) by mouth daily. Start taking on:  September 02, 2018 What changed:  These instructions start on September 02, 2018. If you are unsure what to do until then, ask your doctor or other care provider.   Breo Ellipta 200-25 MCG/INH Aepb Generic drug:  fluticasone  furoate-vilanterol TAKE 1 PUFF BY MOUTH EVERY DAY What changed:  See the new instructions.   carvedilol 12.5 MG tablet Commonly known as:  COREG Take 1 tablet (12.5 mg total) by mouth 2 (two) times daily for 30 days. What changed:    medication strength  how much to take   clopidogrel 75 MG tablet Commonly known as:  PLAVIX Take 1 tablet (75 mg total) by mouth daily.   famotidine 10 MG tablet Commonly known as:  PEPCID Take 1 tablet (10 mg total) by mouth daily as needed for heartburn or indigestion. What changed:    medication strength  how much to take  when to take this   fluticasone 50 MCG/ACT nasal spray Commonly known as:  FLONASE Place 2 sprays into both nostrils daily. What changed:    when to take this  reasons to take this   glucose blood test strip Use as instructed   isosorbide mononitrate 30 MG 24 hr tablet Commonly known as:  IMDUR Take 0.5 tablets (15 mg total) by mouth daily. What changed:  how much to take   latanoprost 0.005 % ophthalmic solution Commonly known as:  XALATAN Place 1 drop into both eyes at bedtime.   montelukast 10 MG tablet Commonly known as:  SINGULAIR Take 1 tablet (10 mg total) by mouth at bedtime as needed (allergies). 1 tablet daily What  changed:    how much to take  how to take this  when to take this  reasons to take this   nitroGLYCERIN 0.4 MG SL tablet Commonly known as:  NITROSTAT Place 1 tablet (0.4 mg total) under the tongue every 5 (five) minutes as needed.   onetouch ultrasoft lancets Use as instructed   OneTouch Delica Lancets Fine Misc TEST TWICE DAILY   OXYGEN Inhale 3-4 L into the lungs at bedtime.   rosuvastatin 10 MG tablet Commonly known as:  CRESTOR Take 1 tablet (10 mg total) by mouth every evening for 30 days. What changed:    medication strength  how much to take   traZODone 50 MG tablet Commonly known as:  DESYREL Take 0.5 tablets (25 mg total) by mouth at bedtime as  needed for sleep. What changed:    how much to take  when to take this  reasons to take this   vitamin B-12 1000 MCG tablet Commonly known as:  CYANOCOBALAMIN Take 1,000 mcg by mouth daily.            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  A walker will not resolve  issue with performing activities of daily living. A wheelchair will allow patient to safely perform daily activities. Patient can safely propel the wheelchair in the home or has a caregiver who can provide assistance.  Accessories: elevating leg rests (ELRs), wheel locks, extensions and anti-tippers.   08/24/18 1316   08/24/18 1317  For home use only DME Other see comment  Once    Comments:  Hoyer lift   08/24/18 1316          Discharge Assessment: Vitals:   08/26/18 0500 08/26/18 0822  BP: (!) 165/70 (!) 174/72  Pulse: 70 68  Resp: 18   Temp: 98.4 F (36.9 C)   SpO2: 100%    Skin clean, dry and intact without evidence of skin break down, no evidence of skin tears noted. IV catheter discontinued intact. Site without signs and symptoms of complications - no redness or edema noted at insertion site, patient denies c/o pain - only slight tenderness at site.  Dressing with slight pressure applied.  D/c Instructions-Education: Discharge instructions given to [name] with verbalized understanding. D/c education completed with patient's spouse including follow up instructions, medication list, d/c activities limitations if indicated, with other d/c instructions as indicated by MD - patient spouse able to verbalize understanding, all questions fully answered. Patient spouse instructed to return to ED, call 911, or call MD for any changes in  condition.  Patient escorted via [place], and D/C home via private auto.  Berton Bon, RN 08/26/2018 12:23 PM

## 2018-08-26 NOTE — TOC Transition Note (Signed)
Transition of Care [facility]) - CM/SW Discharge Note   Patient Details  Name: Susan Patel MRN: [id number] Date of Birth: June 08, 1940  [facility] CM/SW Contact:  Latanya Maudlin, RN Phone Number: 08/26/2018, 11:13 AM   Clinical Narrative:  Patient to be discharged per MD order. Orders in place for home health services. Previous RNCM had setup home health services via [facility] care. Notified Corene Cornea from [facility] of pending discharge. DME ordered from [facility] and per notes was scheduled for home delivery. Voicemail left for [facility], spouse to ensure delivery was established. Patient to received hoyer lift and wheelchair.            Patient Goals and CMS Choice Patient states their goals for this hospitalization and ongoing recovery are:: have her home to take care of her CMS Medicare.gov Compare Post Acute Care list provided to:: Patient Represenative (must comment) Choice offered to / list presented to : Spouse  Discharge Placement                       Discharge Plan and Services     Post Acute Care Choice: Durable Medical Equipment, Home Health          DME Arranged: Wheelchair manual, Other see comment(hoyer lift) DME Agency: [facility] HH Arranged: RN, PT, Speech Therapy, Refused [facility] [place] Agency: [facility]   Social Determinants of Health (SDOH) Interventions     Readmission Risk Interventions Readmission Risk Prevention Plan 08/26/2018  Transportation Screening Complete  PCP or Specialist Appt within 5-7 Days Complete  Home Care Screening Complete  Medication Review (RN CM) Complete  Some recent data might be hidden

## 2018-08-26 NOTE — Discharge Summary (Signed)
Physician Discharge Summary  Susan Patel IWP:[id number] DOB: [birth year] DOA: 08/19/2018  PCP: Dettinger, Fransisca Kaufmann, MD Neurologist: Dr. Phillips Odor Cardiologist: Bronson Ing  Admit date: 08/19/2018 Discharge date: 08/26/2018  Admitted From:  Home  Disposition: Refusing SNF, [place]   Recommendations for Outpatient Follow-up:  1. Follow up with PCP in 1 weeks 2. Follow up with nephrologist in 2 weeks 3. Follow up with neurologist in 4 weeks 4. Follow up with cardiologist in 4 weeks 5. Please obtain BMP/CBC in 1-2 weeks to follow up renal function  Home Health: Expected Discharge Plan and Services Expected Discharge Plan: [place] Choice: Durable Medical Equipment, Home Health Living arrangements for the past 2 months: Single Family Home                       DME Arranged: Wheelchair manual, Other see comment(hoyer lift) DME Agency: [facility] HH Arranged: RN, PT, Speech Therapy, Refused SNF [place] Agency: [facility]  Discharge Condition: STABLE   CODE STATUS: DNR    Brief Hospitalization Summary: Please see all hospital notes, images, labs for full details of the hospitalization. Dr. Toney Sang HPI:   Susan Patel  is a 78 y.o. 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  Patient was recently discharged from [facility] after she was treated for encephalopathy.  Patient was discharged on [place] for UTI. As per patient's husband patient has not been ambulatory since her last admission and required full assistance to walk with a walker.  Patient was able to eat and drink normal as per patient's husband. She does have baseline moderate aphasia and dysarthria. As per the  patient's husband today he went out to throw garbage and when he came back he saw patient lying on the floor and had altered mental status.  EMS was called and patient brought to the ED.  In the ED patient was found to be hypothermic with rectal temperature 96.5, acute kidney injury with creatinine 4.61, her last creatinine was 1.01 on 08/15/2018.  Patient unable to provide any significant history. History obtained from patient's husband on phone.  Brief Admission Hx: 78 year old female with hypertension, diabetes, cerebrovascular disease with resulting dysarthria, chronic systolic heart failure presented with acute kidney injury and encephalopathy.  MDM/Assessment & Plan:   1. Acute metabolic encephalopathy-RESOLVED NOW.  Secondary to renal failure and polypharmacy-patient is clinically improved.  Stable to discharge from acute care.  Family refusing SNF. Will provide 24/7 care at home with home health.   2. Recent acute CVA- recently discharged from [facility] in [place].  She is on Plavix and aspirin.  Aspirin temporarily being held due to AKI.  Resume in 1 week.  Continue plavix daily. I discussed with husband who verbalized understanding.  Follow up with Dr. Merlene Laughter in 4 weeks for stroke follow up care.  She was found to have an acute small infarct in the cortical left frontal subcortical white matter on MRI. 3. Acute kidney injury- RESOLVING....suspect was secondary to dehydration, NSAIDs and ACE inhibitors. Nephrology has seen her and subsequently signed off.  She was treated with IV fluids and her renal function is improving.  Her renal ultrasound was unrevealing.  Her creatinine is down to 4 now and has been  trending down everyday.  Pt encouraged to drink plenty of fluids which she has done.  Outpatient follow up with nephrology recommended.  I spoke with husband who verbalized understanding. 4. Dysarthria- secondary to recent CVA-started on day 1/pured diet with nectar thick liquids.   Speech therapy has evaluated.  Home health speech therapy has been ordered.   5. Poorly controlled essential hypertension-he has been restarted on carvedilol and nephrology team has increased to 12.5 mg twice daily for better blood pressure control and imdur 15 mg daily ordered. 6. Type 2 diabetes mellitus-she has been treated with supplemental sliding scale coverage. DC glimepiride as she is HIGH RISK FOR HYPOGLYCEMIA.  Holding diabetes meds at this time as she is not eating well.  Consider Tradjenta if she needs to restart diabetes medications but would defer to PCP to make decision on outpatient basis, for now, DC glimepiride and monitor BS at home.  7. Diabetic dyslipidemia- continue statin therapy as ordered.  crestor dose was reduced to 10 mg daily due to renal function.   8. Chronic systolic congestive heart failure- her EF is reported to be 40%.  Currently stable.  Watch fluids. 9. COPD- has been stable on home oxygen which she wears at night.  Bronchodilator as needed.  DVT prophylaxis: Heparin Code Status: DNR Family Communication: Husband (telephone) Disposition Plan: Husband wants to take home, not interested in SNF placement, HH and equipment arranged by [facility] teams.   Consultants:  Nephrology  Neurology  Procedures:    Antimicrobials:  Zosyn 4/5-4/6  Discharge Diagnoses:  Principal Problem:   Acute encephalopathy Active Problems:   HTN (hypertension)   Type 2 diabetes mellitus with peripheral neuropathy (HCC)   Chronic pain syndrome   Chronic obstructive pulmonary disease (HCC)   Dysarthria, post-stroke   AKI (acute kidney injury) (HCC)   Mixed hyperlipidemia   Altered mental status   Chronic systolic CHF (congestive heart failure) ([place])   Cerebrovascular disease   Discharge Instructions: Discharge Instructions    Call MD for:  difficulty breathing, headache or visual disturbances   Complete by:  As directed    Call MD for:  extreme fatigue   Complete  by:  As directed    Call MD for:  persistant dizziness or light-headedness   Complete by:  As directed    Call MD for:  persistant nausea and vomiting   Complete by:  As directed    Call MD for:  severe uncontrolled pain   Complete by:  As directed    Increase activity slowly   Complete by:  As directed      Allergies as of 08/26/2018      Reactions   Azithromycin Other (See Comments)   Renal failure   Prednisone Other (See Comments)   Irritability and insomnia   Sulfamethoxazole-trimethoprim Other (See Comments)   Renal failure   Amlodipine Swelling   Caused swelling in legs   Ciprofloxacin Itching, Rash   Tape Rash      Medication List    STOP taking these medications   cefdinir 300 MG capsule Commonly known as:  OMNICEF   cetirizine 10 MG tablet Commonly known as:  ZYRTEC   DULoxetine 30 MG capsule Commonly known as:  CYMBALTA   glimepiride 2 MG tablet Commonly known as:  AMARYL   linaclotide 290 MCG Caps capsule Commonly known as:  Linzess   nabumetone 500 MG tablet Commonly known as:  RELAFEN   tolterodine 1 MG tablet Commonly known as:  DETROL  traMADol 50 MG tablet Commonly known as:  ULTRAM   triamcinolone 0.1 % cream : eucerin Crea   triamcinolone cream 0.1 % Commonly known as:  KENALOG     TAKE these medications   acetaminophen 325 MG tablet Commonly known as:  Tylenol Take 2 tablets (650 mg total) by mouth every 6 (six) hours as needed. What changed:  reasons to take this   albuterol 108 (90 Base) MCG/ACT inhaler Commonly known as:  PROVENTIL HFA;VENTOLIN HFA Inhale 1-2 puffs into the lungs every 6 (six) hours as needed for wheezing or shortness of breath.   aspirin 81 MG chewable tablet Chew 1 tablet (81 mg total) by mouth daily. Start taking on:  September 02, 2018 What changed:  These instructions start on September 02, 2018. If you are unsure what to do until then, ask your doctor or other care provider.   Breo Ellipta 200-25 MCG/INH  Aepb Generic drug:  fluticasone furoate-vilanterol TAKE 1 PUFF BY MOUTH EVERY DAY What changed:  See the new instructions.   carvedilol 12.5 MG tablet Commonly known as:  COREG Take 1 tablet (12.5 mg total) by mouth 2 (two) times daily for 30 days. What changed:    medication strength  how much to take   clopidogrel 75 MG tablet Commonly known as:  PLAVIX Take 1 tablet (75 mg total) by mouth daily.   famotidine 10 MG tablet Commonly known as:  PEPCID Take 1 tablet (10 mg total) by mouth daily as needed for heartburn or indigestion. What changed:    medication strength  how much to take  when to take this   fluticasone 50 MCG/ACT nasal spray Commonly known as:  FLONASE Place 2 sprays into both nostrils daily. What changed:    when to take this  reasons to take this   glucose blood test strip Use as instructed   isosorbide mononitrate 30 MG 24 hr tablet Commonly known as:  IMDUR Take 0.5 tablets (15 mg total) by mouth daily. What changed:  how much to take   latanoprost 0.005 % ophthalmic solution Commonly known as:  XALATAN Place 1 drop into both eyes at bedtime.   montelukast 10 MG tablet Commonly known as:  SINGULAIR Take 1 tablet (10 mg total) by mouth at bedtime as needed (allergies). 1 tablet daily What changed:    how much to take  how to take this  when to take this  reasons to take this   nitroGLYCERIN 0.4 MG SL tablet Commonly known as:  NITROSTAT Place 1 tablet (0.4 mg total) under the tongue every 5 (five) minutes as needed.   onetouch ultrasoft lancets Use as instructed   OneTouch Delica Lancets Fine Misc TEST TWICE DAILY   OXYGEN Inhale 3-4 L into the lungs at bedtime.   rosuvastatin 10 MG tablet Commonly known as:  CRESTOR Take 1 tablet (10 mg total) by mouth every evening for 30 days. What changed:    medication strength  how much to take   traZODone 50 MG tablet Commonly known as:  DESYREL Take 0.5 tablets (25 mg  total) by mouth at bedtime as needed for sleep. What changed:    how much to take  when to take this  reasons to take this   vitamin B-12 1000 MCG tablet Commonly known as:  CYANOCOBALAMIN Take 1,000 mcg by mouth daily.            Durable Medical Equipment  (From admission, onward)  Start     Ordered   08/24/18 1317  For home use only DME standard manual wheelchair with seat cushion  Once    Comments:  Patient suffers from CVA which impairs their ability to perform daily activities like ambulating in the home.  A walker will not resolve  issue with performing activities of daily living. A wheelchair will allow patient to safely perform daily activities. Patient can safely propel the wheelchair in the home or has a caregiver who can provide assistance.  Accessories: elevating leg rests (ELRs), wheel locks, extensions and anti-tippers.   08/24/18 1316   08/24/18 1317  For home use only DME Other see comment  Once    Comments:  Harrel Lemon lift   08/24/18 1316         Follow-up Information    Phillips Odor, MD. Schedule an appointment as soon as possible for a visit in 1 month(s).   Specialty:  Neurology Why:  Hospital Follow up Stroke Contact information: [address] [place] [address] [phone]        Dettinger, Fransisca Kaufmann, MD. Schedule an appointment as soon as possible for a visit in 1 week(s).   Specialties:  Family Medicine, Cardiology Why:  Hospital Follow Up  Contact information: [place] [address] [phone]        Herminio Commons, MD. Schedule an appointment as soon as possible for a visit in 1 month(s).   Specialty:  Cardiology Why:  Hospital Follow Up Stroke Contact information: [place] [address] [phone]        Fran Lowes, MD. Schedule an appointment as soon as possible for a visit in 2 week(s).   Specialty:  Nephrology Why:  Hospital Follow Up for Acute Renal Failure   Contact information: [address] [place] [address] [phone]          Allergies  Allergen Reactions  . Azithromycin Other (See Comments)    Renal failure  . Prednisone Other (See Comments)    Irritability and insomnia  . Sulfamethoxazole-Trimethoprim Other (See Comments)    Renal failure  . Amlodipine Swelling    Caused swelling in legs  . Ciprofloxacin Itching and Rash  . Tape Rash   Allergies as of 08/26/2018      Reactions   Azithromycin Other (See Comments)   Renal failure   Prednisone Other (See Comments)   Irritability and insomnia   Sulfamethoxazole-trimethoprim Other (See Comments)   Renal failure   Amlodipine Swelling   Caused swelling in legs   Ciprofloxacin Itching, Rash   Tape Rash      Medication List    STOP taking these medications   cefdinir 300 MG capsule Commonly known as:  OMNICEF   cetirizine 10 MG tablet Commonly known as:  ZYRTEC   DULoxetine 30 MG capsule Commonly known as:  CYMBALTA   glimepiride 2 MG tablet Commonly known as:  AMARYL   linaclotide 290 MCG Caps capsule Commonly known as:  Linzess   nabumetone 500 MG tablet Commonly known as:  RELAFEN   tolterodine 1 MG tablet Commonly known as:  DETROL   traMADol 50 MG tablet Commonly known as:  ULTRAM   triamcinolone 0.1 % cream : eucerin Crea   triamcinolone cream 0.1 % Commonly known as:  KENALOG     TAKE these medications   acetaminophen 325 MG tablet Commonly known as:  Tylenol Take 2 tablets (650 mg total) by mouth every 6 (  six) hours as needed. What changed:  reasons to take this   albuterol 108 (90 Base) MCG/ACT inhaler Commonly known as:  PROVENTIL HFA;VENTOLIN HFA Inhale 1-2 puffs into the lungs every 6 (six) hours as needed for wheezing or shortness of breath.   aspirin 81 MG chewable tablet Chew 1 tablet (81 mg total) by mouth daily. Start taking on:  September 02, 2018 What changed:  These instructions start on September 02, 2018. If you  are unsure what to do until then, ask your doctor or other care provider.   Breo Ellipta 200-25 MCG/INH Aepb Generic drug:  fluticasone furoate-vilanterol TAKE 1 PUFF BY MOUTH EVERY DAY What changed:  See the new instructions.   carvedilol 12.5 MG tablet Commonly known as:  COREG Take 1 tablet (12.5 mg total) by mouth 2 (two) times daily for 30 days. What changed:    medication strength  how much to take   clopidogrel 75 MG tablet Commonly known as:  PLAVIX Take 1 tablet (75 mg total) by mouth daily.   famotidine 10 MG tablet Commonly known as:  PEPCID Take 1 tablet (10 mg total) by mouth daily as needed for heartburn or indigestion. What changed:    medication strength  how much to take  when to take this   fluticasone 50 MCG/ACT nasal spray Commonly known as:  FLONASE Place 2 sprays into both nostrils daily. What changed:    when to take this  reasons to take this   glucose blood test strip Use as instructed   isosorbide mononitrate 30 MG 24 hr tablet Commonly known as:  IMDUR Take 0.5 tablets (15 mg total) by mouth daily. What changed:  how much to take   latanoprost 0.005 % ophthalmic solution Commonly known as:  XALATAN Place 1 drop into both eyes at bedtime.   montelukast 10 MG tablet Commonly known as:  SINGULAIR Take 1 tablet (10 mg total) by mouth at bedtime as needed (allergies). 1 tablet daily What changed:    how much to take  how to take this  when to take this  reasons to take this   nitroGLYCERIN 0.4 MG SL tablet Commonly known as:  NITROSTAT Place 1 tablet (0.4 mg total) under the tongue every 5 (five) minutes as needed.   onetouch ultrasoft lancets Use as instructed   OneTouch Delica Lancets Fine Misc TEST TWICE DAILY   OXYGEN Inhale 3-4 L into the lungs at bedtime.   rosuvastatin 10 MG tablet Commonly known as:  CRESTOR Take 1 tablet (10 mg total) by mouth every evening for 30 days. What changed:    medication  strength  how much to take   traZODone 50 MG tablet Commonly known as:  DESYREL Take 0.5 tablets (25 mg total) by mouth at bedtime as needed for sleep. What changed:    how much to take  when to take this  reasons to take this   vitamin B-12 1000 MCG tablet Commonly known as:  CYANOCOBALAMIN Take 1,000 mcg by mouth daily.            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  A walker will not resolve  issue with  performing activities of daily living. A wheelchair will allow patient to safely perform daily activities. Patient can safely propel the wheelchair in the home or has a caregiver who can provide assistance.  Accessories: elevating leg rests (ELRs), wheel locks, extensions and anti-tippers.   08/24/18 1316   08/24/18 1317  For home use only DME Other see comment  Once    Comments:  Harrel Lemon lift   08/24/18 1316          Procedures/Studies: Ct Head Wo Contrast  Result Date: 08/20/2018 CLINICAL DATA:  Unresponsive. EXAM: CT HEAD WITHOUT CONTRAST TECHNIQUE: Contiguous axial images were obtained from the base of the skull through the vertex without intravenous contrast. COMPARISON:  Oct 10, 2016 FINDINGS: Brain: No subdural, epidural, or subarachnoid hemorrhage. Cerebellum, brainstem, and basal cisterns are normal. A right a septal infarct is stable. White matter changes persists. A lacunar infarct in the right posterior parietal region is not definitely seen on the previous study but does not have an acute appearance. Lacunar infarcts in the right basal ganglia are more prominent the interval but also do not appear to be acute. No acute cortical ischemia noted. Ventricles and sulci are prominent but stable. No mass effect or  midline shift. Vascular: Calcified atherosclerosis in the intracranial carotids. Skull: Normal. Negative for fracture or focal lesion. Sinuses/Orbits: No acute finding. Other: None. IMPRESSION: 1. Lacunar infarcts in the right posterior parietal region and the right basal ganglia are more prominent or new since May of 2018. However, I suspect these are not acute. Chronic white matter changes. No acute intracranial abnormality noted. Electronically Signed   By: Dorise Bullion III M.D   On: 08/20/2018 01:17   Ct Chest Wo Contrast  Result Date: 08/01/2018 CLINICAL DATA:  Lung nodule follow-up. EXAM: CT CHEST WITHOUT CONTRAST TECHNIQUE: Multidetector CT imaging of the chest was performed following the standard protocol without IV contrast. COMPARISON:  Coronary CTA dated January 10, 2018. FINDINGS: Cardiovascular: Normal heart size. No pericardial effusion. No thoracic aortic aneurysm. Coronary, aortic arch, and branch vessel atherosclerotic vascular disease. Mediastinum/Nodes: No enlarged mediastinal or axillary lymph nodes. Thyroid gland, trachea, and esophagus demonstrate no significant findings. Lungs/Pleura: Unchanged 5 x 3 mm subpleural nodule in the lingula (series 4, image 74). Unchanged 7 x 4 mm nodule in the more inferior lingula (series 4, image 93). Unchanged 9 x 5 mm subpleural nodule in the left lower lobe (series 4, image 93). Unchanged 4 mm subpleural nodule in the right middle lobe (series 4, image 86). Unchanged 6 x 4 mm subpleural nodule in the right lower lobe (series 4, image 75). No focal consolidation, pleural effusion, or pneumothorax. Upper Abdomen: No acute abnormality. Punctate hepatic and splenic calcified granulomas. Musculoskeletal: No chest wall mass or suspicious bone lesions identified. Prior lower cervical ACDF. IMPRESSION: 1. Multiple bilateral pulmonary nodules measuring up to 7 mm in mean diameter are unchanged when compared to prior study. Non-contrast chest CT at 12-18 months  (from today's scan) is considered optional for low-risk patients, but is recommended for high-risk patients. This recommendation follows the consensus statement: Guidelines for Management of Incidental Pulmonary Nodules Detected on CT Images: From the Fleischner Society 2017; Radiology 2017; 284:228-243. 2.  Aortic atherosclerosis (ICD10-I70.0). Electronically Signed   By: Titus Dubin M.D.   On: 08/01/2018 09:05   Mr Jodene Nam Head Wo Contrast  Result Date: 08/21/2018 CLINICAL DATA:  Altered mental status for 2 days.  No known injury. EXAM: MRI HEAD WITHOUT CONTRAST MRA HEAD WITHOUT CONTRAST TECHNIQUE:  Multiplanar, multiecho pulse sequences of the brain and surrounding structures were obtained without intravenous contrast. Angiographic images of the head were obtained using MRA technique without contrast. COMPARISON:  CT head 08/20/2018. MR head 10/10/2016. CTA 10/09/2016. FINDINGS: Significant motion degradation. Small or subtle lesions could be overlooked. MRI HEAD FINDINGS Brain: Possible punctate focus of restricted diffusion, series 3, image 82, LEFT frontal subcortical white matter, could represent an acute infarct. No definite acute hemorrhage, mass lesion, or extra-axial fluid. Hydrocephalus ex vacuo. Extensive white matter disease affecting the periventricular and subcortical regions, consistent with chronic microvascular ischemic change. Large area of chronic infarction affects the RIGHT occipital lobe. Second area of chronic infarction of moderate size affects the RIGHT parietal periventricular white matter. BILATERAL basal ganglia infarcts are chronic. Vascular: Reported separately. Skull and upper cervical spine: Normal marrow signal. Sinuses/Orbits: No acute findings Other: Compared with prior imaging, similar appearance is noted. MRA HEAD FINDINGS Gross patency of the carotid arteries is established. Due to significant motion degradation. Assessment of the middle cerebral arteries is limited, but  both M1 segments appear patent. Previously described BILATERAL ICA termini stenoses are poorly visualized on this exam, but likely still present, particularly on the LEFT. Both vertebral arteries are patent and contribute to the basilar artery, RIGHT dominant. The distal LEFT vertebral artery appears to be stenotic, of uncertain significance given the robust RIGHT vertebral. The basilar artery is patent. IMPRESSION: Significant motion degradation, marginally diagnostic exam. Possible small acute infarct, LEFT frontal subcortical white matter. Atrophy and small vessel disease. Chronic RIGHT hemisphere and BILATERAL basal ganglia infarcts. Patency of the BILATERAL internal carotid arteries and basilar artery is established on MRA. RIGHT vertebral dominant, LEFT vertebral distal stenosis, uncertain significance. See discussion above. Electronically Signed   By: Staci Righter M.D.   On: 08/21/2018 10:16   Mr Brain Wo Contrast  Result Date: 08/21/2018 CLINICAL DATA:  Altered mental status for 2 days.  No known injury. EXAM: MRI HEAD WITHOUT CONTRAST MRA HEAD WITHOUT CONTRAST TECHNIQUE: Multiplanar, multiecho pulse sequences of the brain and surrounding structures were obtained without intravenous contrast. Angiographic images of the head were obtained using MRA technique without contrast. COMPARISON:  CT head 08/20/2018. MR head 10/10/2016. CTA 10/09/2016. FINDINGS: Significant motion degradation. Small or subtle lesions could be overlooked. MRI HEAD FINDINGS Brain: Possible punctate focus of restricted diffusion, series 3, image 82, LEFT frontal subcortical white matter, could represent an acute infarct. No definite acute hemorrhage, mass lesion, or extra-axial fluid. Hydrocephalus ex vacuo. Extensive white matter disease affecting the periventricular and subcortical regions, consistent with chronic microvascular ischemic change. Large area of chronic infarction affects the RIGHT occipital lobe. Second area of  chronic infarction of moderate size affects the RIGHT parietal periventricular white matter. BILATERAL basal ganglia infarcts are chronic. Vascular: Reported separately. Skull and upper cervical spine: Normal marrow signal. Sinuses/Orbits: No acute findings Other: Compared with prior imaging, similar appearance is noted. MRA HEAD FINDINGS Gross patency of the carotid arteries is established. Due to significant motion degradation. Assessment of the middle cerebral arteries is limited, but both M1 segments appear patent. Previously described BILATERAL ICA termini stenoses are poorly visualized on this exam, but likely still present, particularly on the LEFT. Both vertebral arteries are patent and contribute to the basilar artery, RIGHT dominant. The distal LEFT vertebral artery appears to be stenotic, of uncertain significance given the robust RIGHT vertebral. The basilar artery is patent. IMPRESSION: Significant motion degradation, marginally diagnostic exam. Possible small acute infarct, LEFT frontal subcortical white matter.  Atrophy and small vessel disease. Chronic RIGHT hemisphere and BILATERAL basal ganglia infarcts. Patency of the BILATERAL internal carotid arteries and basilar artery is established on MRA. RIGHT vertebral dominant, LEFT vertebral distal stenosis, uncertain significance. See discussion above. Electronically Signed   By: Staci Righter M.D.   On: 08/21/2018 10:16   US Renal  Result Date: 08/21/2018 CLINICAL DATA:  Acute kidney injury EXAM: RENAL / URINARY TRACT ULTRASOUND COMPLETE COMPARISON:  CT abdomen 07/07/2012 FINDINGS: Right Kidney: Renal measurements: 11.5 by 5.3 by 5.6 cm = volume: 178 mL . Echogenicity within normal limits. No mass or hydronephrosis visualized. Left Kidney: Renal measurements: 10.1 by 6.3 by 5.9 cm = volume: 197 mL. Echogenicity within normal limits. No mass or hydronephrosis visualized. Bladder: Not observed, presumed empty. Limitation: Altered mental status,  difficulty cooperating with the exam. IMPRESSION: 1. Normal sonographic appearance of the kidneys. 2. The urinary bladder was not seen and is presumed empty. Electronically Signed   By: Van Clines M.D.   On: 08/21/2018 09:27   Dg Chest Port 1 View  Result Date: 08/20/2018 CLINICAL DATA:  Patient found unresponsive. EXAM: PORTABLE CHEST 1 VIEW COMPARISON:  April 10, 2018 FINDINGS: The study is limited due to the low volume portable technique. Within this limitation, no pneumothorax. Cardiomediastinal silhouette is stable. The lungs are clear. No other acute abnormalities. IMPRESSION: The study is limited due to the low volume portable technique. However, no acute abnormalities are noted. Electronically Signed   By: Dorise Bullion III M.D   On: 08/20/2018 01:17      Subjective: Pt remains dysarthric but has been eating and drinking and her encephalopathy has resolved.    Discharge Exam: Vitals:   08/26/18 0500 08/26/18 0822  BP: (!) 165/70 (!) 174/72  Pulse: 70 68  Resp: 18   Temp: 98.4 F (36.9 C)   SpO2: 100%    Vitals:   08/25/18 1500 08/25/18 2134 08/26/18 0500 08/26/18 0822  BP: (!) 164/70 (!) 167/84 (!) 165/70 (!) 174/72  Pulse: 88 72 70 68  Resp: 18 16 18    Temp: 98.6 F (37 C) 98.6 F (37 C) 98.4 F (36.9 C)   TempSrc:  Oral Oral   SpO2: 98% 98% 100%   Weight:      Height:       General: Pt is alert, awake, not in acute distress, remains dysarthric, blinks yes/no answers Cardiovascular: normal S1/S2 +, no rubs, no gallops Respiratory: CTA bilaterally, no wheezing, no rhonchi Abdominal: Soft, NT, ND, bowel sounds + Extremities:  no cyanosis   The results of significant diagnostics from this hospitalization (including imaging, microbiology, ancillary and laboratory) are listed below for reference.     Microbiology: Recent Results (from the past 240 hour(s))  Blood culture (routine x 2)     Status: None   Collection Time: 08/20/18 12:08 AM  Result Value  Ref Range Status   Specimen Description BLOOD RIGHT HAND  Final   Special Requests   Final    BOTTLES DRAWN AEROBIC AND ANAEROBIC Blood Culture adequate volume   Culture   Final    NO GROWTH 5 DAYS Performed at [facility], [address], [place], [place] [address]    Report Status 08/25/2018 FINAL  Final  Blood culture (routine x 2)     Status: None   Collection Time: 08/20/18 12:24 AM  Result Value Ref Range Status   Specimen Description RIGHT ANTECUBITAL  Final   Special Requests   Final  BOTTLES DRAWN AEROBIC AND ANAEROBIC Blood Culture adequate volume   Culture   Final    NO GROWTH 5 DAYS Performed at [facility], [address], [place], [place] [address]    Report Status 08/25/2018 FINAL  Final  Culture, Urine     Status: Abnormal   Collection Time: 08/20/18  2:28 AM  Result Value Ref Range Status   Specimen Description   Final    URINE, CLEAN CATCH Performed at [facility], [address] [place], [place] [address]    Special Requests   Final    NONE Performed at [facility], [address], [place], [place] [address]    Culture (A)  Final    <10,000 COLONIES/mL INSIGNIFICANT GROWTH Performed at [place] [address], [place], [place] [address]    Report Status 08/21/2018 FINAL  Final  MRSA PCR Screening     Status: Abnormal   Collection Time: 08/20/18  6:08 AM  Result Value Ref Range Status   MRSA by PCR POSITIVE (A) NEGATIVE Final    Comment:        The GeneXpert MRSA Assay (FDA approved for NASAL specimens only), is one component of a comprehensive MRSA colonization surveillance program. It is not intended to diagnose MRSA infection nor to guide or monitor treatment for MRSA infections. RESULT CALLED TO, READ BACK BY AND VERIFIED WITH: STONE,S AT 1336 ON 08/20/2018 BY JPM Performed at [facility], [address] [place],  [address]     Labs: BNP (last 3 results) No results for input(s): BNP in the last 8760 hours. Basic  Metabolic Panel: Recent Labs  Lab 08/22/18 0452 08/23/18 0520 08/24/18 0605 08/25/18 0511 08/26/18 0518  NA 134* 137 138 142 142  K 3.4* 3.1* 3.3* 3.4* 3.1*  CL 105 107 105 110 107  CO2 16* 17* 17* 20* 23  GLUCOSE 152* 178* 149* 121* 125*  BUN 44* 44* 38* 36* 34*  CREATININE 6.94* 7.06* 6.35* 5.23* 4.18*  CALCIUM 7.9* 8.2* 8.5* 8.6* 8.8*  MG  --   --   --  1.7  --   PHOS 5.1* 5.7* 5.2* 4.3 3.8   Liver Function Tests: Recent Labs  Lab 08/20/18 0023 08/21/18 0814 08/22/18 0452 08/23/18 0520 08/24/18 0605 08/25/18 0511 08/26/18 0518  AST 26 19  --   --   --   --   --   ALT 27 19  --   --   --   --   --   ALKPHOS 85 70  --   --   --   --   --   BILITOT 0.9 0.8  --   --   --   --   --   PROT 6.8 5.3*  --   --   --   --   --   ALBUMIN 3.4* 2.5* 2.5* 2.5* 2.8* 2.7* 2.9*   No results for input(s): LIPASE, AMYLASE in the last 168 hours. Recent Labs  Lab 08/23/18 0520  AMMONIA 25   CBC: Recent Labs  Lab 08/20/18 0023 08/21/18 0814  WBC 6.8 7.9  NEUTROABS 4.3  --   HGB 12.1 10.5*  HCT 37.9 32.6*  MCV 89.0 88.1  PLT 229 212   Cardiac Enzymes: Recent Labs  Lab 08/20/18 1307  CKTOTAL 182   BNP: Invalid input(s): POCBNP CBG: Recent Labs  Lab 08/25/18 1647 08/25/18 2000 08/26/18 0001 08/26/18 0359 08/26/18 0741  GLUCAP 94 113* 187* 133* 120*  D-Dimer No results for input(s): DDIMER in the last 72 hours. Hgb A1c No results for input(s): HGBA1C in the last 72 hours. Lipid Profile No results for input(s): CHOL, HDL, LDLCALC, TRIG, CHOLHDL, LDLDIRECT in the last 72 hours. Thyroid function studies No results for input(s): TSH, T4TOTAL, T3FREE, THYROIDAB in the last 72 hours.  Invalid input(s): FREET3 Anemia work up No results for input(s): VITAMINB12, FOLATE, FERRITIN, TIBC, IRON, RETICCTPCT in the last 72 hours. Urinalysis    Component Value Date/Time   COLORURINE AMBER (A) 08/20/2018 0228   APPEARANCEUR CLOUDY (A) 08/20/2018 0228   LABSPEC 1.026  08/20/2018 0228   PHURINE 5.0 08/20/2018 0228   GLUCOSEU NEGATIVE 08/20/2018 0228   HGBUR SMALL (A) 08/20/2018 0228   BILIRUBINUR NEGATIVE 08/20/2018 0228   KETONESUR 5 (A) 08/20/2018 0228   PROTEINUR 100 (A) 08/20/2018 0228   NITRITE NEGATIVE 08/20/2018 0228   LEUKOCYTESUR NEGATIVE 08/20/2018 0228   Sepsis Labs Invalid input(s): PROCALCITONIN,  WBC,  LACTICIDVEN Microbiology Recent Results (from the past 240 hour(s))  Blood culture (routine x 2)     Status: None   Collection Time: 08/20/18 12:08 AM  Result Value Ref Range Status   Specimen Description BLOOD RIGHT HAND  Final   Special Requests   Final    BOTTLES DRAWN AEROBIC AND ANAEROBIC Blood Culture adequate volume   Culture   Final    NO GROWTH 5 DAYS Performed at [facility], [address], [place], [place] [address]    Report Status 08/25/2018 FINAL  Final  Blood culture (routine x 2)     Status: None   Collection Time: 08/20/18 12:24 AM  Result Value Ref Range Status   Specimen Description RIGHT ANTECUBITAL  Final   Special Requests   Final    BOTTLES DRAWN AEROBIC AND ANAEROBIC Blood Culture adequate volume   Culture   Final    NO GROWTH 5 DAYS Performed at [facility], [address], [place], [place] [address]    Report Status 08/25/2018 FINAL  Final  Culture, Urine     Status: Abnormal   Collection Time: 08/20/18  2:28 AM  Result Value Ref Range Status   Specimen Description   Final    URINE, CLEAN CATCH Performed at [facility], [address] [place], [place] [address]    Special Requests   Final    NONE Performed at [facility], [address] [place], [place] [address]    Culture (A)  Final    <10,000 COLONIES/mL INSIGNIFICANT GROWTH Performed at [facility], [place] [address], [place], [place] [address]    Report Status 08/21/2018 FINAL  Final  MRSA PCR Screening     Status: Abnormal   Collection Time: 08/20/18  6:08 AM  Result Value Ref Range Status   MRSA by PCR POSITIVE (A) NEGATIVE  Final    Comment:        The GeneXpert MRSA Assay (FDA approved for NASAL specimens only), is one component of a comprehensive MRSA colonization surveillance program. It is not intended to diagnose MRSA infection nor to guide or monitor treatment for MRSA infections. RESULT CALLED TO, READ BACK BY AND VERIFIED WITH: STONE,S AT 1336 ON 08/20/2018 BY JPM Performed at [facility], [address] [place],  [address]    Time coordinating discharge: 33 minutes   SIGNED:  Irwin Brakeman, MD  [facility] 08/26/2018, 10:28 AM How to contact the [facility] Attending or Consulting provider [place] or covering provider during after  hours 7P -7A, for this patient?  1. Check the care team in [facility] and look for a) attending/consulting TR[facility] provider listed and b) the [facility] team listed 2. Log into [email or web address] and use [place]'s universal password to access. If you do not have the password, please contact the hospital operator. 3. Locate the [facility] provider you are looking for under [facility] and page to a number that you can be directly reached. 4. If you still have difficulty reaching the provider, please page the [facility] (Director on Call) for the Hospitalists listed on amion for assistance.

## 2018-08-28 DIAGNOSIS — I110 Hypertensive heart disease with heart failure: Secondary | ICD-10-CM | POA: Diagnosis not present

## 2018-08-28 DIAGNOSIS — E1169 Type 2 diabetes mellitus with other specified complication: Secondary | ICD-10-CM | POA: Diagnosis not present

## 2018-08-28 DIAGNOSIS — G20 Parkinson's disease: Secondary | ICD-10-CM | POA: Diagnosis not present

## 2018-08-28 DIAGNOSIS — I69320 Aphasia following cerebral infarction: Secondary | ICD-10-CM | POA: Diagnosis not present

## 2018-08-28 DIAGNOSIS — E782 Mixed hyperlipidemia: Secondary | ICD-10-CM | POA: Diagnosis not present

## 2018-08-28 DIAGNOSIS — G894 Chronic pain syndrome: Secondary | ICD-10-CM | POA: Diagnosis not present

## 2018-08-28 DIAGNOSIS — Z9181 History of falling: Secondary | ICD-10-CM | POA: Diagnosis not present

## 2018-08-28 DIAGNOSIS — I5022 Chronic systolic (congestive) heart failure: Secondary | ICD-10-CM | POA: Diagnosis not present

## 2018-08-28 DIAGNOSIS — R296 Repeated falls: Secondary | ICD-10-CM | POA: Diagnosis not present

## 2018-08-28 DIAGNOSIS — J449 Chronic obstructive pulmonary disease, unspecified: Secondary | ICD-10-CM | POA: Diagnosis not present

## 2018-08-29 DIAGNOSIS — G894 Chronic pain syndrome: Secondary | ICD-10-CM | POA: Diagnosis not present

## 2018-08-29 DIAGNOSIS — Z9181 History of falling: Secondary | ICD-10-CM | POA: Diagnosis not present

## 2018-08-29 DIAGNOSIS — E1169 Type 2 diabetes mellitus with other specified complication: Secondary | ICD-10-CM | POA: Diagnosis not present

## 2018-08-29 DIAGNOSIS — I110 Hypertensive heart disease with heart failure: Secondary | ICD-10-CM | POA: Diagnosis not present

## 2018-08-29 DIAGNOSIS — R296 Repeated falls: Secondary | ICD-10-CM | POA: Diagnosis not present

## 2018-08-29 DIAGNOSIS — J449 Chronic obstructive pulmonary disease, unspecified: Secondary | ICD-10-CM | POA: Diagnosis not present

## 2018-08-29 DIAGNOSIS — I69320 Aphasia following cerebral infarction: Secondary | ICD-10-CM | POA: Diagnosis not present

## 2018-08-29 DIAGNOSIS — E782 Mixed hyperlipidemia: Secondary | ICD-10-CM | POA: Diagnosis not present

## 2018-08-29 DIAGNOSIS — G20 Parkinson's disease: Secondary | ICD-10-CM | POA: Diagnosis not present

## 2018-08-29 DIAGNOSIS — I5022 Chronic systolic (congestive) heart failure: Secondary | ICD-10-CM | POA: Diagnosis not present

## 2018-08-30 DIAGNOSIS — E1169 Type 2 diabetes mellitus with other specified complication: Secondary | ICD-10-CM | POA: Diagnosis not present

## 2018-08-30 DIAGNOSIS — J449 Chronic obstructive pulmonary disease, unspecified: Secondary | ICD-10-CM | POA: Diagnosis not present

## 2018-08-30 DIAGNOSIS — I110 Hypertensive heart disease with heart failure: Secondary | ICD-10-CM | POA: Diagnosis not present

## 2018-08-30 DIAGNOSIS — G894 Chronic pain syndrome: Secondary | ICD-10-CM | POA: Diagnosis not present

## 2018-08-30 DIAGNOSIS — I5022 Chronic systolic (congestive) heart failure: Secondary | ICD-10-CM | POA: Diagnosis not present

## 2018-08-30 DIAGNOSIS — I69320 Aphasia following cerebral infarction: Secondary | ICD-10-CM | POA: Diagnosis not present

## 2018-08-30 DIAGNOSIS — Z9181 History of falling: Secondary | ICD-10-CM | POA: Diagnosis not present

## 2018-08-30 DIAGNOSIS — E782 Mixed hyperlipidemia: Secondary | ICD-10-CM | POA: Diagnosis not present

## 2018-08-30 DIAGNOSIS — G20 Parkinson's disease: Secondary | ICD-10-CM | POA: Diagnosis not present

## 2018-08-30 DIAGNOSIS — R296 Repeated falls: Secondary | ICD-10-CM | POA: Diagnosis not present

## 2018-09-01 ENCOUNTER — Encounter: Payer: Self-pay | Admitting: Family Medicine

## 2018-09-01 ENCOUNTER — Other Ambulatory Visit: Payer: Self-pay

## 2018-09-01 ENCOUNTER — Ambulatory Visit (INDEPENDENT_AMBULATORY_CARE_PROVIDER_SITE_OTHER): Payer: Medicare HMO | Admitting: Family Medicine

## 2018-09-01 VITALS — BP 152/79 | HR 74 | Temp 97.1°F

## 2018-09-01 DIAGNOSIS — N179 Acute kidney failure, unspecified: Secondary | ICD-10-CM | POA: Diagnosis not present

## 2018-09-01 DIAGNOSIS — G9341 Metabolic encephalopathy: Secondary | ICD-10-CM | POA: Diagnosis not present

## 2018-09-01 DIAGNOSIS — I5022 Chronic systolic (congestive) heart failure: Secondary | ICD-10-CM | POA: Diagnosis not present

## 2018-09-01 DIAGNOSIS — G20 Parkinson's disease: Secondary | ICD-10-CM | POA: Diagnosis not present

## 2018-09-01 DIAGNOSIS — I110 Hypertensive heart disease with heart failure: Secondary | ICD-10-CM | POA: Diagnosis not present

## 2018-09-01 DIAGNOSIS — E782 Mixed hyperlipidemia: Secondary | ICD-10-CM | POA: Diagnosis not present

## 2018-09-01 DIAGNOSIS — J449 Chronic obstructive pulmonary disease, unspecified: Secondary | ICD-10-CM | POA: Diagnosis not present

## 2018-09-01 DIAGNOSIS — R296 Repeated falls: Secondary | ICD-10-CM | POA: Diagnosis not present

## 2018-09-01 DIAGNOSIS — Z8673 Personal history of transient ischemic attack (TIA), and cerebral infarction without residual deficits: Secondary | ICD-10-CM | POA: Diagnosis not present

## 2018-09-01 DIAGNOSIS — I69320 Aphasia following cerebral infarction: Secondary | ICD-10-CM | POA: Diagnosis not present

## 2018-09-01 DIAGNOSIS — G894 Chronic pain syndrome: Secondary | ICD-10-CM | POA: Diagnosis not present

## 2018-09-01 DIAGNOSIS — E1169 Type 2 diabetes mellitus with other specified complication: Secondary | ICD-10-CM | POA: Diagnosis not present

## 2018-09-01 DIAGNOSIS — Z9181 History of falling: Secondary | ICD-10-CM | POA: Diagnosis not present

## 2018-09-01 MED ORDER — DULAGLUTIDE 0.75 MG/0.5ML ~~LOC~~ SOAJ: 0.7500 mg | SUBCUTANEOUS | 3 refills | Status: DC

## 2018-09-01 NOTE — Patient Outreach (Signed)
[facility]) Care Management  09/01/2018  DAJAE KIZER [birth year] [id number]   EMMI- General Discharge RED ON EMMI ALERT Day # 4 Date: 08/31/2018 Red Alert Reason:  Sad/hopeless/ anxious/ empty? Yes   Outreach attempt: spoke with patient and husband.  Spouse able to verify HIPAA. He states that patient is doing better everyday.  He states she is able to walk with assistance.  Addressed red alert. He states that patient has sometimes when feels down about all that she has been through but he denies any deep down depression where patient is not involved and not doing daily things.  He states he is there with patient at all times and is helping her through her recovery. He denies any current needs.     Plan: RN CM will close case.    Jone Baseman, RN, MSN [facility] Care Management Care Management Coordinator Direct Line [phone] Toll Free: [phone]  Fax: [phone]

## 2018-09-01 NOTE — Progress Notes (Signed)
BP (!) 152/79   Pulse 74   Temp (!) 97.1 F (36.2 C) (Oral)    Subjective:   Patient ID: Susan Patel, female    DOB: [birth year], 78 y.o.   MRN: [id number]  HPI: Susan Patel is a 78 y.o. female presenting on 09/01/2018 for Hospitalization Follow-up (4/4 AP)   HPI Patient is coming in today for hospital/ER follow-up/hospital follow-up.  She was admitted on [facility] on 08/19/2018 and discharged on 08/26/2018.  She was admitted for acute encephalopathy and acute kidney injury and pneumonia and was found to have the kidney injury and have had a stroke prior to the hospitalization.  Patient has been increasingly responsive per husband since leaving the hospital although she was much less responsive prior to the hospitalization because her previous Parkinson's and strokes.  She was also found to have a UTI and was treated for such.  Her creatinine was 4 oh upon discharge which was trending down from 5 or 6.  Patient was restarted on carvedilol for blood pressures and stop the ACE inhibitor's for the renal damage.  They also held some of her medications because of the encephalitis and altered mental status.  Her glimepiride was stopped because of possible hypoglycemia.  Relevant past medical, surgical, family and social history reviewed and updated as indicated. Interim medical history since our last visit reviewed. Allergies and medications reviewed and updated.  Review of Systems  Constitutional: Negative for chills and fever.  HENT: Negative for congestion, ear discharge and ear pain.   Eyes: Negative for visual disturbance.  Respiratory: Negative for chest tightness and shortness of breath.   Cardiovascular: Negative for chest pain and leg swelling.  Genitourinary: Negative for difficulty urinating and dysuria.  Musculoskeletal: Positive for gait problem. Negative for back pain.  Skin: Negative for rash.  Neurological: Positive for weakness. Negative for light-headedness and  headaches.  Psychiatric/Behavioral: Positive for confusion. Negative for agitation, behavioral problems and dysphoric mood.  All other systems reviewed and are negative.   Per HPI unless specifically indicated above   Allergies as of 09/01/2018      Reactions   Azithromycin Other (See Comments)   Renal failure   Prednisone Other (See Comments)   Irritability and insomnia   Sulfamethoxazole-trimethoprim Other (See Comments)   Renal failure   Amlodipine Swelling   Caused swelling in legs   Ciprofloxacin Itching, Rash   Tape Rash      Medication List       Accurate as of September 01, 2018  1:52 PM. Always use your most recent med list.        acetaminophen 325 MG tablet Commonly known as:  Tylenol Take 2 tablets (650 mg total) by mouth every 6 (six) hours as needed.   albuterol 108 (90 Base) MCG/ACT inhaler Commonly known as:  VENTOLIN HFA Inhale 1-2 puffs into the lungs every 6 (six) hours as needed for wheezing or shortness of breath.   aspirin 81 MG chewable tablet Chew 1 tablet (81 mg total) by mouth daily. Start taking on:  September 02, 2018   Breo Ellipta 200-25 MCG/INH Aepb Generic drug:  fluticasone furoate-vilanterol TAKE 1 PUFF BY MOUTH EVERY DAY   carvedilol 12.5 MG tablet Commonly known as:  COREG Take 1 tablet (12.5 mg total) by mouth 2 (two) times daily for 30 days.   clopidogrel 75 MG tablet Commonly known as:  PLAVIX Take 1 tablet (75 mg total) by mouth daily.   Dulaglutide 0.75 MG/0.5ML  Sopn Commonly known as:  Trulicity Inject 4.09 mg into the skin once a week.   famotidine 10 MG tablet Commonly known as:  PEPCID Take 1 tablet (10 mg total) by mouth daily as needed for heartburn or indigestion.   fluticasone 50 MCG/ACT nasal spray Commonly known as:  FLONASE Place 2 sprays into both nostrils daily.   glucose blood test strip Use as instructed   isosorbide mononitrate 30 MG 24 hr tablet Commonly known as:  IMDUR Take 0.5 tablets (15 mg  total) by mouth daily.   latanoprost 0.005 % ophthalmic solution Commonly known as:  XALATAN Place 1 drop into both eyes at bedtime.   montelukast 10 MG tablet Commonly known as:  SINGULAIR Take 1 tablet (10 mg total) by mouth at bedtime as needed (allergies). 1 tablet daily   nitroGLYCERIN 0.4 MG SL tablet Commonly known as:  NITROSTAT Place 1 tablet (0.4 mg total) under the tongue every 5 (five) minutes as needed.   onetouch ultrasoft lancets Use as instructed   OneTouch Delica Lancets Fine Misc TEST TWICE DAILY   OXYGEN Inhale 3-4 L into the lungs at bedtime.   rosuvastatin 10 MG tablet Commonly known as:  CRESTOR Take 1 tablet (10 mg total) by mouth every evening for 30 days.   traZODone 50 MG tablet Commonly known as:  DESYREL Take 0.5 tablets (25 mg total) by mouth at bedtime as needed for sleep.   vitamin B-12 1000 MCG tablet Commonly known as:  CYANOCOBALAMIN Take 1,000 mcg by mouth daily.        Objective:   BP (!) 152/79   Pulse 74   Temp (!) 97.1 F (36.2 C) (Oral)   Wt Readings from Last 3 Encounters:  08/20/18 199 lb 15.3 oz (90.7 kg)  07/31/18 201 lb 6.4 oz (91.4 kg)  06/29/18 203 lb 12.8 oz (92.4 kg)    Physical Exam Vitals signs and nursing note reviewed.  Constitutional:      General: She is not in acute distress.    Appearance: She is well-developed. She is not diaphoretic.     Comments: Wheelchair-bound and minimally responsive which is slightly less responsive than she was previously  Eyes:     Conjunctiva/sclera: Conjunctivae normal.  Cardiovascular:     Rate and Rhythm: Normal rate and regular rhythm.     Heart sounds: Normal heart sounds. No murmur.  Pulmonary:     Effort: Pulmonary effort is normal. No respiratory distress.     Breath sounds: Normal breath sounds. No wheezing.  Skin:    General: Skin is warm and dry.     Findings: No rash.  Neurological:     Mental Status: Mental status is at baseline. She is confused.   Psychiatric:        Behavior: Behavior normal.       Assessment & Plan:   Problem List Items Addressed This Visit    None    Visit Diagnoses    Acute kidney injury ([place])    -  Primary   Status post CVA       Metabolic encephalopathy       Resolved now    Will check kidney function and start on Trulicity because her glimepiride was stopped.  Depending on renal function we may consider restarting Linzess in the future for her bowels. Follow up plan: Return in about 4 weeks (around 09/29/2018), or if symptoms worsen or fail to improve, for Recheck renal function and diabetes.  Counseling provided  for all of the vaccine components No orders of the defined types were placed in this encounter.   Caryl Pina, MD [facility] 09/01/2018, 1:52 PM

## 2018-09-01 NOTE — Addendum Note (Signed)
Addended by: Caryl Pina on: 09/01/2018 01:53 PM   Modules accepted: Orders

## 2018-09-02 LAB — CBC WITH DIFFERENTIAL/PLATELET
Basophils Absolute: 0.1 10*3/uL (ref 0.0–0.2)
Basos: 2 %
EOS (ABSOLUTE): 0.2 10*3/uL (ref 0.0–0.4)
Eos: 3 %
Hematocrit: 33.2 % — ABNORMAL LOW (ref 34.0–46.6)
Hemoglobin: 10.8 g/dL — ABNORMAL LOW (ref 11.1–15.9)
Immature Grans (Abs): 0.1 10*3/uL (ref 0.0–0.1)
Immature Granulocytes: 1 %
Lymphocytes Absolute: 1.9 10*3/uL (ref 0.7–3.1)
Lymphs: 21 %
MCH: 28.1 pg (ref 26.6–33.0)
MCHC: 32.5 g/dL (ref 31.5–35.7)
MCV: 86 fL (ref 79–97)
Monocytes Absolute: 1.1 10*3/uL — ABNORMAL HIGH (ref 0.1–0.9)
Monocytes: 12 %
Neutrophils Absolute: 5.6 10*3/uL (ref 1.4–7.0)
Neutrophils: 61 %
Platelets: 426 10*3/uL (ref 150–450)
RBC: 3.85 x10E6/uL (ref 3.77–5.28)
RDW: 13.5 % (ref 11.7–15.4)
WBC: 9 10*3/uL (ref 3.4–10.8)

## 2018-09-02 LAB — CMP14+EGFR
ALT: 18 IU/L (ref 0–32)
AST: 17 IU/L (ref 0–40)
Albumin/Globulin Ratio: 1.5 (ref 1.2–2.2)
Albumin: 3.5 g/dL — ABNORMAL LOW (ref 3.7–4.7)
Alkaline Phosphatase: 82 IU/L (ref 39–117)
BUN/Creatinine Ratio: 11 — ABNORMAL LOW (ref 12–28)
BUN: 19 mg/dL (ref 8–27)
Bilirubin Total: 0.3 mg/dL (ref 0.0–1.2)
CO2: 19 mmol/L — ABNORMAL LOW (ref 20–29)
Calcium: 9.1 mg/dL (ref 8.7–10.3)
Chloride: 102 mmol/L (ref 96–106)
Creatinine, Ser: 1.7 mg/dL — ABNORMAL HIGH (ref 0.57–1.00)
GFR calc Af Amer: 34 mL/min/{1.73_m2} — ABNORMAL LOW (ref >59)
GFR calc non Af Amer: 29 mL/min/{1.73_m2} — ABNORMAL LOW (ref >59)
Globulin, Total: 2.4 g/dL (ref 1.5–4.5)
Glucose: 183 mg/dL — ABNORMAL HIGH (ref 65–99)
Potassium: 4.3 mmol/L (ref 3.5–5.2)
Sodium: 142 mmol/L (ref 134–144)
Total Protein: 5.9 g/dL — ABNORMAL LOW (ref 6.0–8.5)

## 2018-09-04 DIAGNOSIS — I110 Hypertensive heart disease with heart failure: Secondary | ICD-10-CM | POA: Diagnosis not present

## 2018-09-04 DIAGNOSIS — Z9181 History of falling: Secondary | ICD-10-CM | POA: Diagnosis not present

## 2018-09-04 DIAGNOSIS — J449 Chronic obstructive pulmonary disease, unspecified: Secondary | ICD-10-CM | POA: Diagnosis not present

## 2018-09-04 DIAGNOSIS — I69320 Aphasia following cerebral infarction: Secondary | ICD-10-CM | POA: Diagnosis not present

## 2018-09-04 DIAGNOSIS — I5022 Chronic systolic (congestive) heart failure: Secondary | ICD-10-CM | POA: Diagnosis not present

## 2018-09-04 DIAGNOSIS — E782 Mixed hyperlipidemia: Secondary | ICD-10-CM | POA: Diagnosis not present

## 2018-09-04 DIAGNOSIS — G894 Chronic pain syndrome: Secondary | ICD-10-CM | POA: Diagnosis not present

## 2018-09-04 DIAGNOSIS — G20 Parkinson's disease: Secondary | ICD-10-CM | POA: Diagnosis not present

## 2018-09-04 DIAGNOSIS — R296 Repeated falls: Secondary | ICD-10-CM | POA: Diagnosis not present

## 2018-09-04 DIAGNOSIS — E1169 Type 2 diabetes mellitus with other specified complication: Secondary | ICD-10-CM | POA: Diagnosis not present

## 2018-09-05 DIAGNOSIS — E782 Mixed hyperlipidemia: Secondary | ICD-10-CM | POA: Diagnosis not present

## 2018-09-05 DIAGNOSIS — G894 Chronic pain syndrome: Secondary | ICD-10-CM | POA: Diagnosis not present

## 2018-09-05 DIAGNOSIS — E1169 Type 2 diabetes mellitus with other specified complication: Secondary | ICD-10-CM | POA: Diagnosis not present

## 2018-09-05 DIAGNOSIS — R296 Repeated falls: Secondary | ICD-10-CM | POA: Diagnosis not present

## 2018-09-05 DIAGNOSIS — G20 Parkinson's disease: Secondary | ICD-10-CM | POA: Diagnosis not present

## 2018-09-05 DIAGNOSIS — I110 Hypertensive heart disease with heart failure: Secondary | ICD-10-CM | POA: Diagnosis not present

## 2018-09-05 DIAGNOSIS — I69320 Aphasia following cerebral infarction: Secondary | ICD-10-CM | POA: Diagnosis not present

## 2018-09-05 DIAGNOSIS — J449 Chronic obstructive pulmonary disease, unspecified: Secondary | ICD-10-CM | POA: Diagnosis not present

## 2018-09-05 DIAGNOSIS — Z9181 History of falling: Secondary | ICD-10-CM | POA: Diagnosis not present

## 2018-09-05 DIAGNOSIS — I5022 Chronic systolic (congestive) heart failure: Secondary | ICD-10-CM | POA: Diagnosis not present

## 2018-09-06 ENCOUNTER — Other Ambulatory Visit: Payer: Self-pay

## 2018-09-06 ENCOUNTER — Ambulatory Visit (INDEPENDENT_AMBULATORY_CARE_PROVIDER_SITE_OTHER): Payer: Medicare HMO

## 2018-09-06 DIAGNOSIS — G20 Parkinson's disease: Secondary | ICD-10-CM

## 2018-09-06 DIAGNOSIS — Z7982 Long term (current) use of aspirin: Secondary | ICD-10-CM

## 2018-09-06 DIAGNOSIS — E1169 Type 2 diabetes mellitus with other specified complication: Secondary | ICD-10-CM

## 2018-09-06 DIAGNOSIS — E782 Mixed hyperlipidemia: Secondary | ICD-10-CM | POA: Diagnosis not present

## 2018-09-06 DIAGNOSIS — Z7951 Long term (current) use of inhaled steroids: Secondary | ICD-10-CM

## 2018-09-06 DIAGNOSIS — Z9181 History of falling: Secondary | ICD-10-CM | POA: Diagnosis not present

## 2018-09-06 DIAGNOSIS — G894 Chronic pain syndrome: Secondary | ICD-10-CM | POA: Diagnosis not present

## 2018-09-06 DIAGNOSIS — J449 Chronic obstructive pulmonary disease, unspecified: Secondary | ICD-10-CM

## 2018-09-06 DIAGNOSIS — I5022 Chronic systolic (congestive) heart failure: Secondary | ICD-10-CM | POA: Diagnosis not present

## 2018-09-06 DIAGNOSIS — I69320 Aphasia following cerebral infarction: Secondary | ICD-10-CM

## 2018-09-06 DIAGNOSIS — R296 Repeated falls: Secondary | ICD-10-CM

## 2018-09-06 DIAGNOSIS — I110 Hypertensive heart disease with heart failure: Secondary | ICD-10-CM

## 2018-09-06 DIAGNOSIS — Z7902 Long term (current) use of antithrombotics/antiplatelets: Secondary | ICD-10-CM

## 2018-09-07 ENCOUNTER — Telehealth: Payer: Self-pay | Admitting: *Deleted

## 2018-09-07 NOTE — Telephone Encounter (Signed)
VM from [place] PT w/ [facility] BP 160/90 pt had har time relaxing, pt wanting to know if a different BP med can be called in Please advise, may call physical therapist back or pt at [phone]

## 2018-09-07 NOTE — Telephone Encounter (Signed)
PT from [facility] aware, patients husband also aware of televisit 09/08/18

## 2018-09-07 NOTE — Telephone Encounter (Signed)
Go ahead and put her on the schedule for tomorrow and I will call her tomorrow

## 2018-09-08 ENCOUNTER — Other Ambulatory Visit: Payer: Self-pay

## 2018-09-08 ENCOUNTER — Ambulatory Visit (INDEPENDENT_AMBULATORY_CARE_PROVIDER_SITE_OTHER): Payer: Medicare HMO | Admitting: Family Medicine

## 2018-09-08 ENCOUNTER — Encounter: Payer: Self-pay | Admitting: Family Medicine

## 2018-09-08 DIAGNOSIS — I10 Essential (primary) hypertension: Secondary | ICD-10-CM

## 2018-09-08 NOTE — Progress Notes (Signed)
Virtual Visit via telephone Note  I connected with Susan Patel on 09/08/18 at 1424 by telephone and verified that I am speaking with the correct person using two identifiers. Susan Patel is currently located at home and husband wayne are currently with her during visit. The provider, Fransisca Kaufmann Abigaelle Verley, MD is located in their office at time of visit.  Call ended at 1436  I discussed the limitations, risks, security and privacy concerns of performing an evaluation and management service by telephone and the availability of in person appointments. I also discussed with the patient that there may be a patient responsible charge related to this service. The patient expressed understanding and agreed to proceed.   History and Present Illness: Patient's bp was high yesterday at 160/90 yesterday when the home health pt came by.  She denies any headaches or chest pain or shortness of breath. Her BP is 140/71 today.  She has not had any major difficulties or symptoms.  She has been otherwise acting normally and has no news symptoms, she did have a stroke and has residual from that but that has not changed.  No diagnosis found.  Outpatient Encounter Medications as of 09/08/2018  Medication Sig  . acetaminophen (TYLENOL) 325 MG tablet Take 2 tablets (650 mg total) by mouth every 6 (six) hours as needed. (Patient taking differently: Take 650 mg by mouth every 6 (six) hours as needed for moderate pain or headache. )  . albuterol (PROVENTIL HFA;VENTOLIN HFA) 108 (90 Base) MCG/ACT inhaler Inhale 1-2 puffs into the lungs every 6 (six) hours as needed for wheezing or shortness of breath.  Marland Kitchen aspirin 81 MG chewable tablet Chew 1 tablet (81 mg total) by mouth daily.  Marland Kitchen BREO ELLIPTA 200-25 MCG/INH AEPB TAKE 1 PUFF BY MOUTH EVERY DAY (Patient taking differently: Inhale 1 puff into the lungs daily. )  . carvedilol (COREG) 12.5 MG tablet Take 1 tablet (12.5 mg total) by mouth 2 (two) times daily for 30 days.   . clopidogrel (PLAVIX) 75 MG tablet Take 1 tablet (75 mg total) by mouth daily.  . Dulaglutide (TRULICITY) 4.78 GN/5.6OZ SOPN Inject 0.75 mg into the skin once a week.  . famotidine (PEPCID) 10 MG tablet Take 1 tablet (10 mg total) by mouth daily as needed for heartburn or indigestion.  . fluticasone (FLONASE) 50 MCG/ACT nasal spray Place 2 sprays into both nostrils daily. (Patient taking differently: Place 2 sprays into both nostrils daily as needed for allergies. )  . glucose blood test strip Use as instructed  . isosorbide mononitrate (IMDUR) 30 MG 24 hr tablet Take 0.5 tablets (15 mg total) by mouth daily.  . Lancets (ONETOUCH ULTRASOFT) lancets Use as instructed  . latanoprost (XALATAN) 0.005 % ophthalmic solution Place 1 drop into both eyes at bedtime.  . montelukast (SINGULAIR) 10 MG tablet Take 1 tablet (10 mg total) by mouth at bedtime as needed (allergies). 1 tablet daily  . nitroGLYCERIN (NITROSTAT) 0.4 MG SL tablet Place 1 tablet (0.4 mg total) under the tongue every 5 (five) minutes as needed.  Glory Rosebush DELICA LANCETS FINE MISC TEST TWICE DAILY  . OXYGEN Inhale 3-4 L into the lungs at bedtime.  . rosuvastatin (CRESTOR) 10 MG tablet Take 1 tablet (10 mg total) by mouth every evening for 30 days.  . traZODone (DESYREL) 50 MG tablet Take 0.5 tablets (25 mg total) by mouth at bedtime as needed for sleep.  . vitamin B-12 (CYANOCOBALAMIN) 1000 MCG tablet Take 1,000 mcg  by mouth daily.   No facility-administered encounter medications on file as of 09/08/2018.     Review of Systems  Constitutional: Negative for chills and fever.  Eyes: Negative for visual disturbance.  Respiratory: Negative for chest tightness and shortness of breath.   Cardiovascular: Negative for chest pain and leg swelling.  Musculoskeletal: Negative for back pain and gait problem.  Skin: Negative for rash.  Neurological: Negative for dizziness, weakness, light-headedness, numbness and headaches.   Psychiatric/Behavioral: Negative for agitation and behavioral problems.  All other systems reviewed and are negative.   Observations/Objective: Most of the conversation with husband but she when she chimed and sounded comfortable and in no acute distress  Assessment and Plan: Problem List Items Addressed This Visit      Cardiovascular and Mediastinum   HTN (hypertension) - Primary       Follow Up Instructions:  They will follow-up with blood pressures by take them every day and let me know how they are running over the next week, it was only up once it has been down all the other times.   I discussed the assessment and treatment plan with the patient. The patient was provided an opportunity to ask questions and all were answered. The patient agreed with the plan and demonstrated an understanding of the instructions.   The patient was advised to call back or seek an in-person evaluation if the symptoms worsen or if the condition fails to improve as anticipated.  The above assessment and management plan was discussed with the patient. The patient verbalized understanding of and has agreed to the management plan. Patient is aware to call the clinic if symptoms persist or worsen. Patient is aware when to return to the clinic for a follow-up visit. Patient educated on when it is appropriate to go to the emergency department.    I provided 12 minutes of non-face-to-face time during this encounter.    Worthy Rancher, MD

## 2018-09-10 ENCOUNTER — Other Ambulatory Visit: Payer: Self-pay | Admitting: Pediatrics

## 2018-09-11 DIAGNOSIS — Z9181 History of falling: Secondary | ICD-10-CM | POA: Diagnosis not present

## 2018-09-11 DIAGNOSIS — J449 Chronic obstructive pulmonary disease, unspecified: Secondary | ICD-10-CM | POA: Diagnosis not present

## 2018-09-11 DIAGNOSIS — I69391 Dysphagia following cerebral infarction: Secondary | ICD-10-CM | POA: Diagnosis not present

## 2018-09-11 DIAGNOSIS — E782 Mixed hyperlipidemia: Secondary | ICD-10-CM | POA: Diagnosis not present

## 2018-09-11 DIAGNOSIS — R296 Repeated falls: Secondary | ICD-10-CM | POA: Diagnosis not present

## 2018-09-11 DIAGNOSIS — G894 Chronic pain syndrome: Secondary | ICD-10-CM | POA: Diagnosis not present

## 2018-09-11 DIAGNOSIS — E1169 Type 2 diabetes mellitus with other specified complication: Secondary | ICD-10-CM | POA: Diagnosis not present

## 2018-09-11 DIAGNOSIS — I110 Hypertensive heart disease with heart failure: Secondary | ICD-10-CM | POA: Diagnosis not present

## 2018-09-11 DIAGNOSIS — I5022 Chronic systolic (congestive) heart failure: Secondary | ICD-10-CM | POA: Diagnosis not present

## 2018-09-11 DIAGNOSIS — Z8673 Personal history of transient ischemic attack (TIA), and cerebral infarction without residual deficits: Secondary | ICD-10-CM | POA: Diagnosis not present

## 2018-09-11 DIAGNOSIS — G20 Parkinson's disease: Secondary | ICD-10-CM | POA: Diagnosis not present

## 2018-09-11 DIAGNOSIS — I69320 Aphasia following cerebral infarction: Secondary | ICD-10-CM | POA: Diagnosis not present

## 2018-09-18 DIAGNOSIS — G894 Chronic pain syndrome: Secondary | ICD-10-CM | POA: Diagnosis not present

## 2018-09-18 DIAGNOSIS — Z9181 History of falling: Secondary | ICD-10-CM | POA: Diagnosis not present

## 2018-09-18 DIAGNOSIS — E1169 Type 2 diabetes mellitus with other specified complication: Secondary | ICD-10-CM | POA: Diagnosis not present

## 2018-09-18 DIAGNOSIS — I69320 Aphasia following cerebral infarction: Secondary | ICD-10-CM | POA: Diagnosis not present

## 2018-09-18 DIAGNOSIS — J449 Chronic obstructive pulmonary disease, unspecified: Secondary | ICD-10-CM | POA: Diagnosis not present

## 2018-09-18 DIAGNOSIS — R296 Repeated falls: Secondary | ICD-10-CM | POA: Diagnosis not present

## 2018-09-18 DIAGNOSIS — I110 Hypertensive heart disease with heart failure: Secondary | ICD-10-CM | POA: Diagnosis not present

## 2018-09-18 DIAGNOSIS — E782 Mixed hyperlipidemia: Secondary | ICD-10-CM | POA: Diagnosis not present

## 2018-09-18 DIAGNOSIS — G20 Parkinson's disease: Secondary | ICD-10-CM | POA: Diagnosis not present

## 2018-09-18 DIAGNOSIS — I5022 Chronic systolic (congestive) heart failure: Secondary | ICD-10-CM | POA: Diagnosis not present

## 2018-09-19 DIAGNOSIS — I5022 Chronic systolic (congestive) heart failure: Secondary | ICD-10-CM | POA: Diagnosis not present

## 2018-09-19 DIAGNOSIS — J449 Chronic obstructive pulmonary disease, unspecified: Secondary | ICD-10-CM | POA: Diagnosis not present

## 2018-09-19 DIAGNOSIS — E782 Mixed hyperlipidemia: Secondary | ICD-10-CM | POA: Diagnosis not present

## 2018-09-19 DIAGNOSIS — G894 Chronic pain syndrome: Secondary | ICD-10-CM | POA: Diagnosis not present

## 2018-09-19 DIAGNOSIS — I69320 Aphasia following cerebral infarction: Secondary | ICD-10-CM | POA: Diagnosis not present

## 2018-09-19 DIAGNOSIS — Z9181 History of falling: Secondary | ICD-10-CM | POA: Diagnosis not present

## 2018-09-19 DIAGNOSIS — E1169 Type 2 diabetes mellitus with other specified complication: Secondary | ICD-10-CM | POA: Diagnosis not present

## 2018-09-19 DIAGNOSIS — I110 Hypertensive heart disease with heart failure: Secondary | ICD-10-CM | POA: Diagnosis not present

## 2018-09-19 DIAGNOSIS — G20 Parkinson's disease: Secondary | ICD-10-CM | POA: Diagnosis not present

## 2018-09-19 DIAGNOSIS — R296 Repeated falls: Secondary | ICD-10-CM | POA: Diagnosis not present

## 2018-09-21 DIAGNOSIS — Z9181 History of falling: Secondary | ICD-10-CM | POA: Diagnosis not present

## 2018-09-21 DIAGNOSIS — E782 Mixed hyperlipidemia: Secondary | ICD-10-CM | POA: Diagnosis not present

## 2018-09-21 DIAGNOSIS — I5022 Chronic systolic (congestive) heart failure: Secondary | ICD-10-CM | POA: Diagnosis not present

## 2018-09-21 DIAGNOSIS — I69320 Aphasia following cerebral infarction: Secondary | ICD-10-CM | POA: Diagnosis not present

## 2018-09-21 DIAGNOSIS — I110 Hypertensive heart disease with heart failure: Secondary | ICD-10-CM | POA: Diagnosis not present

## 2018-09-21 DIAGNOSIS — R296 Repeated falls: Secondary | ICD-10-CM | POA: Diagnosis not present

## 2018-09-21 DIAGNOSIS — G894 Chronic pain syndrome: Secondary | ICD-10-CM | POA: Diagnosis not present

## 2018-09-21 DIAGNOSIS — G20 Parkinson's disease: Secondary | ICD-10-CM | POA: Diagnosis not present

## 2018-09-21 DIAGNOSIS — E1169 Type 2 diabetes mellitus with other specified complication: Secondary | ICD-10-CM | POA: Diagnosis not present

## 2018-09-21 DIAGNOSIS — J449 Chronic obstructive pulmonary disease, unspecified: Secondary | ICD-10-CM | POA: Diagnosis not present

## 2018-09-24 ENCOUNTER — Other Ambulatory Visit: Payer: Self-pay | Admitting: Family Medicine

## 2018-09-24 DIAGNOSIS — I639 Cerebral infarction, unspecified: Secondary | ICD-10-CM | POA: Diagnosis not present

## 2018-09-26 DIAGNOSIS — E1169 Type 2 diabetes mellitus with other specified complication: Secondary | ICD-10-CM | POA: Diagnosis not present

## 2018-09-26 DIAGNOSIS — Z9181 History of falling: Secondary | ICD-10-CM | POA: Diagnosis not present

## 2018-09-26 DIAGNOSIS — I5022 Chronic systolic (congestive) heart failure: Secondary | ICD-10-CM | POA: Diagnosis not present

## 2018-09-26 DIAGNOSIS — I110 Hypertensive heart disease with heart failure: Secondary | ICD-10-CM | POA: Diagnosis not present

## 2018-09-26 DIAGNOSIS — I69320 Aphasia following cerebral infarction: Secondary | ICD-10-CM | POA: Diagnosis not present

## 2018-09-26 DIAGNOSIS — J449 Chronic obstructive pulmonary disease, unspecified: Secondary | ICD-10-CM | POA: Diagnosis not present

## 2018-09-26 DIAGNOSIS — E782 Mixed hyperlipidemia: Secondary | ICD-10-CM | POA: Diagnosis not present

## 2018-09-26 DIAGNOSIS — R296 Repeated falls: Secondary | ICD-10-CM | POA: Diagnosis not present

## 2018-09-26 DIAGNOSIS — G20 Parkinson's disease: Secondary | ICD-10-CM | POA: Diagnosis not present

## 2018-09-26 DIAGNOSIS — G894 Chronic pain syndrome: Secondary | ICD-10-CM | POA: Diagnosis not present

## 2018-09-28 ENCOUNTER — Other Ambulatory Visit: Payer: Self-pay | Admitting: Family Medicine

## 2018-09-28 DIAGNOSIS — E1169 Type 2 diabetes mellitus with other specified complication: Secondary | ICD-10-CM | POA: Diagnosis not present

## 2018-09-28 DIAGNOSIS — R296 Repeated falls: Secondary | ICD-10-CM | POA: Diagnosis not present

## 2018-09-28 DIAGNOSIS — G20 Parkinson's disease: Secondary | ICD-10-CM | POA: Diagnosis not present

## 2018-09-28 DIAGNOSIS — J449 Chronic obstructive pulmonary disease, unspecified: Secondary | ICD-10-CM | POA: Diagnosis not present

## 2018-09-28 DIAGNOSIS — I69320 Aphasia following cerebral infarction: Secondary | ICD-10-CM | POA: Diagnosis not present

## 2018-09-28 DIAGNOSIS — Z9181 History of falling: Secondary | ICD-10-CM | POA: Diagnosis not present

## 2018-09-28 DIAGNOSIS — G894 Chronic pain syndrome: Secondary | ICD-10-CM | POA: Diagnosis not present

## 2018-09-28 DIAGNOSIS — I110 Hypertensive heart disease with heart failure: Secondary | ICD-10-CM | POA: Diagnosis not present

## 2018-09-28 DIAGNOSIS — I5022 Chronic systolic (congestive) heart failure: Secondary | ICD-10-CM | POA: Diagnosis not present

## 2018-09-28 DIAGNOSIS — E782 Mixed hyperlipidemia: Secondary | ICD-10-CM | POA: Diagnosis not present

## 2018-10-11 DIAGNOSIS — I69391 Dysphagia following cerebral infarction: Secondary | ICD-10-CM | POA: Diagnosis not present

## 2018-10-11 DIAGNOSIS — Z8673 Personal history of transient ischemic attack (TIA), and cerebral infarction without residual deficits: Secondary | ICD-10-CM | POA: Diagnosis not present

## 2018-10-13 ENCOUNTER — Ambulatory Visit: Payer: Medicare HMO | Admitting: Student

## 2018-10-20 ENCOUNTER — Other Ambulatory Visit: Payer: Self-pay | Admitting: Family Medicine

## 2018-10-25 DIAGNOSIS — I639 Cerebral infarction, unspecified: Secondary | ICD-10-CM | POA: Diagnosis not present

## 2018-11-02 ENCOUNTER — Other Ambulatory Visit: Payer: Self-pay

## 2018-11-03 ENCOUNTER — Other Ambulatory Visit: Payer: Self-pay | Admitting: Family

## 2018-11-03 DIAGNOSIS — R059 Cough, unspecified: Secondary | ICD-10-CM

## 2018-11-03 DIAGNOSIS — R05 Cough: Secondary | ICD-10-CM

## 2018-11-11 DIAGNOSIS — Z8673 Personal history of transient ischemic attack (TIA), and cerebral infarction without residual deficits: Secondary | ICD-10-CM | POA: Diagnosis not present

## 2018-11-11 DIAGNOSIS — I69391 Dysphagia following cerebral infarction: Secondary | ICD-10-CM | POA: Diagnosis not present

## 2018-11-13 ENCOUNTER — Ambulatory Visit (INDEPENDENT_AMBULATORY_CARE_PROVIDER_SITE_OTHER): Payer: Medicare HMO | Admitting: Family Medicine

## 2018-11-13 ENCOUNTER — Other Ambulatory Visit: Payer: Self-pay

## 2018-11-13 ENCOUNTER — Encounter: Payer: Self-pay | Admitting: Family Medicine

## 2018-11-13 VITALS — BP 152/88 | HR 78 | Temp 98.9°F | Ht 65.0 in | Wt 186.0 lb

## 2018-11-13 DIAGNOSIS — L237 Allergic contact dermatitis due to plants, except food: Secondary | ICD-10-CM | POA: Diagnosis not present

## 2018-11-13 MED ORDER — PREDNISONE 20 MG PO TABS: ORAL_TABLET | ORAL | 0 refills | Status: DC

## 2018-11-13 MED ORDER — TRIAMCINOLONE ACETONIDE 0.1 % EX CREA: 1.0000 "application " | TOPICAL_CREAM | Freq: Two times a day (BID) | CUTANEOUS | 0 refills | Status: DC

## 2018-11-13 NOTE — Progress Notes (Signed)
    Subjective:     Susan Patel is a 78 y.o. female who presents for evaluation of a rash involving the forearm and lower leg. Rash started 5 days ago. Lesions are red in color, and raised in texture. Rash has not changed over time. Rash is pruritic. Associated symptoms: none. Patient denies: abdominal pain, arthralgia, congestion, cough, crankiness, decrease in appetite, decrease in energy level, fever, headache, irritability, myalgia, nausea, sore throat and vomiting. Patient has not had contacts with similar rash. Patient has had new exposures to poison oak.   The following portions of the patient's history were reviewed and updated as appropriate: allergies, current medications, past family history, past medical history, past social history, past surgical history and problem list.  Review of Systems Constitutional: negative Eyes: negative Ears, nose, mouth, throat, and face: negative Respiratory: negative Cardiovascular: negative Integument/breast: positive for pruritus and rash    Objective:    BP (!) 152/88   Pulse 78   Temp 98.9 F (37.2 C) (Oral)   Ht 5\' 5"  (1.651 m)   Wt 186 lb (84.4 kg)   BMI 30.95 kg/m   Physical Exam  Constitutional: Susan Patel is well-developed, well-nourished, and in no distress. No distress.  HENT:  Head: Normocephalic and atraumatic.  Eyes: Pupils are equal, round, and reactive to light.  Neck: Normal range of motion. Neck supple.  Cardiovascular: Normal rate, regular rhythm and normal heart sounds. Exam reveals no gallop and no friction rub.  No murmur heard. Pulmonary/Chest: Effort normal and breath sounds normal. No respiratory distress.  Neurological: Susan Patel is alert.  Skin: Skin is warm and dry. Rash noted. Susan Patel is not diaphoretic.  Psychiatric: Mood, affect and judgment normal.    General:  alert, cooperative and no distress  Skin:  rash noted on extremities, a few scattered red, raised vesicles to forearms and legs. No drainage.      Assessment:     Susan Patel was seen today for rash.  Diagnoses and all orders for this visit:  Poison oak dermatitis Minimal rash to forearms and lower legs. Symptomatic care discussed. Triamcinolone cream as needed for pruritis. Will give oral steroids. Report any new or worsening symptoms. Medications as prescribed.  -     predniSONE (DELTASONE) 20 MG tablet; 2 po at sametime daily for 5 days -     triamcinolone cream (KENALOG) 0.1 %; Apply 1 application topically 2 (two) times daily.     Plan:    Medications: steroids: prednisone and triamcinolone. Follow up as needed.   The above assessment and management plan was discussed with the patient. The patient verbalized understanding of and has agreed to the management plan. Patient is aware to call the clinic if symptoms fail to improve or worsen. Patient is aware when to return to the clinic for a follow-up visit. Patient educated on when it is appropriate to go to the emergency department.   Monia Pouch, FNP-C [facility] [address] [place], [place] [address] [phone]

## 2018-11-13 NOTE — Patient Instructions (Signed)

## 2018-11-20 ENCOUNTER — Other Ambulatory Visit: Payer: Self-pay | Admitting: Family Medicine

## 2018-11-24 DIAGNOSIS — I639 Cerebral infarction, unspecified: Secondary | ICD-10-CM | POA: Diagnosis not present

## 2018-11-27 ENCOUNTER — Other Ambulatory Visit: Payer: Self-pay | Admitting: Family Medicine

## 2018-11-28 ENCOUNTER — Ambulatory Visit: Payer: Medicare HMO | Admitting: Student

## 2018-11-28 NOTE — Progress Notes (Deleted)
Cardiology Office Note    Date:  11/28/2018   ID:  Susan, Patel 07-Oct-1940, MRN [id number]  PCP:  Dettinger, Fransisca Kaufmann, MD  Cardiologist: Kate Sable, MD    No chief complaint on file.   History of Present Illness:    Susan Patel is a 78 y.o. female with past medical history of CAD (s/p multivessel CAD by cath in 05/2018 and felt to be poor CABG candidate), HTN, HLD, Type 2 DM, COPD, and prior CVA's who presents to the office today for 20-month follow-up.   She was last examined by Dr. Bronson Ing in 06/2018 following recent CT surgery evaluation and denied any recent chest pain at that time. Continued medical management was recommended and she was continued on Coreg, Plavix, and Imdur with Rosuvastatin 40 mg daily being reinitiated.  In the interim, she was admitted to [facility] from 08/19/2018 to 08/26/2018 for acute metabolic encephalopathy which was thought to be secondary to an AKI and polypharmacy.  Creatinine peaked at 7.06 during admission and was improved to 4.18 at discharge. Rechecked by her PCP on 4/17 and 1.70 then.   Past Medical History:  Diagnosis Date  . Arthritis    'all over"  . Asthma   . Chronic pain   . COPD (chronic obstructive pulmonary disease) (HCC)    O2 per [place] at nights   . Depression   . Diabetes mellitus without complication ([place])    borderline- , states she was on med., but MD told her "everything is under control so I threw the bottle away"  . Fibromyalgia   . GERD (gastroesophageal reflux disease)   . Hypertension   . Neuromuscular disorder (HCC)    parkinson, neuropathy- both feet & hands.  . Stroke [facility])    residual dysarthria    Past Surgical History:  Procedure Laterality Date  . ABDOMINAL HYSTERECTOMY    . ANTERIOR CERVICAL DECOMP/DISCECTOMY FUSION N/A 07/10/2015   Procedure: Cervical five-six, Cervical six-seven anterior cervical decompression with fusion plating and bonegraft;  Surgeon: Jovita Gamma, MD;  Location: [place]  NEURO ORS;  Service: Neurosurgery;  Laterality: N/A;  . APPENDECTOMY    . BIOPSY EYE MUSCLE  03/20/2016   biopsy vessel to right eye due to swelling  . EYE SURGERY Bilateral    cataracts removed, /w "cyrstal lenses"   . LEFT HEART CATH AND CORONARY ANGIOGRAPHY N/A 05/23/2018   Procedure: LEFT HEART CATH AND CORONARY ANGIOGRAPHY;  Surgeon: Troy Sine, MD;  Location: [place] CV LAB;  Service: Cardiovascular;  Laterality: N/A;  . SHOULDER ARTHROSCOPY Right    x2   RCR- spurs removed     Current Medications: Outpatient Medications Prior to Visit  Medication Sig Dispense Refill  . acetaminophen (TYLENOL) 325 MG tablet Take 2 tablets (650 mg total) by mouth every 6 (six) hours as needed. (Patient taking differently: Take 650 mg by mouth every 6 (six) hours as needed for moderate pain or headache. ) 30 tablet 0  . albuterol (PROVENTIL HFA;VENTOLIN HFA) 108 (90 Base) MCG/ACT inhaler Inhale 1-2 puffs into the lungs every 6 (six) hours as needed for wheezing or shortness of breath. 1 Inhaler 1  . aspirin 81 MG chewable tablet Chew 1 tablet (81 mg total) by mouth daily.    Marland Kitchen aspirin 81 MG EC tablet TAKE 1 TABLET (81 MG TOTAL) BY MOUTH DAILY FOR 90 DAYS. 90 tablet 0  . BREO ELLIPTA 200-25 MCG/INH AEPB TAKE 1 PUFF BY MOUTH EVERY DAY (Patient taking  differently: Inhale 1 puff into the lungs daily. ) 60 each 4  . carvedilol (COREG) 12.5 MG tablet Take 1 tablet (12.5 mg total) by mouth 2 (two) times daily. 60 tablet 0  . clopidogrel (PLAVIX) 75 MG tablet Take 1 tablet (75 mg total) by mouth daily. 90 tablet 3  . Dulaglutide (TRULICITY) 5.46 FK/8.1EX SOPN Inject 0.75 mg into the skin once a week. 12 pen 3  . famotidine (PEPCID) 10 MG tablet Take 1 tablet (10 mg total) by mouth daily as needed for heartburn or indigestion.    . fluticasone (FLONASE) 50 MCG/ACT nasal spray SPRAY 2 SPRAYS INTO EACH NOSTRIL EVERY DAY 48 mL 1  . glucose blood test strip Use as instructed 100 each 2  . isosorbide  mononitrate (IMDUR) 30 MG 24 hr tablet Take 0.5 tablets (15 mg total) by mouth daily. 90 tablet 3  . Lancets (ONETOUCH ULTRASOFT) lancets Use as instructed 100 each 12  . latanoprost (XALATAN) 0.005 % ophthalmic solution Place 1 drop into both eyes at bedtime. 2.5 mL 0  . montelukast (SINGULAIR) 10 MG tablet Take 1 tablet (10 mg total) by mouth at bedtime as needed (allergies). 1 tablet daily 90 tablet 3  . nitroGLYCERIN (NITROSTAT) 0.4 MG SL tablet Place 1 tablet (0.4 mg total) under the tongue every 5 (five) minutes as needed. (Patient not taking: Reported on 11/13/2018) 25 tablet 3  . ONETOUCH DELICA LANCETS FINE MISC TEST TWICE DAILY 100 each 9  . OXYGEN Inhale 3-4 L into the lungs at bedtime.    . predniSONE (DELTASONE) 20 MG tablet 2 po at sametime daily for 5 days 10 tablet 0  . rosuvastatin (CRESTOR) 10 MG tablet TAKE 1 TABLET (10 MG TOTAL) BY MOUTH EVERY EVENING FOR 30 DAYS. 30 tablet 2  . traZODone (DESYREL) 50 MG tablet Take 0.5 tablets (25 mg total) by mouth at bedtime as needed for sleep. 90 tablet 3  . triamcinolone cream (KENALOG) 0.1 % Apply 1 application topically 2 (two) times daily. 30 g 0  . vitamin B-12 (CYANOCOBALAMIN) 1000 MCG tablet Take 1,000 mcg by mouth daily.     No facility-administered medications prior to visit.      Allergies:   Azithromycin, Prednisone, Sulfamethoxazole-trimethoprim, Amlodipine, Ciprofloxacin, and Tape   Social History   Socioeconomic History  . Marital status: Married    Spouse name: wayne  . Number of children: 2  . Years of education: 16  . Highest education level: Bachelor's degree (e.g., BA, AB, BS)  Occupational History  . Occupation: retired    Comment: Automotive engineer  Social Needs  . Financial resource strain: Not hard at all  . Food insecurity    Worry: Never true    Inability: Never true  . Transportation needs    Medical: No    Non-medical: No  Tobacco Use  . Smoking status: Never Smoker  . Smokeless  tobacco: Never Used  Substance and Sexual Activity  . Alcohol use: No  . Drug use: No  . Sexual activity: Yes  Lifestyle  . Physical activity    Days per week: 0 days    Minutes per session: 0 min  . Stress: Not at all  Relationships  . Social connections    Talks on phone: More than three times a week    Gets together: More than three times a week    Attends religious service: More than 4 times per year    Active member of club or organization: Yes  Attends meetings of clubs or organizations: More than 4 times per year    Relationship status: Married  Other Topics Concern  . Not on file  Social History Narrative  . Not on file     Family History:  The patient's ***family history includes Arthritis in her sister and sister; Atrial fibrillation in her son; Cancer in her brother and father; Heart disease in her father and mother; Heart failure in her sister; Thyroid cancer in her son.   Review of Systems:   Please see the history of present illness.     General:  No chills, fever, night sweats or weight changes.  Cardiovascular:  No chest pain, dyspnea on exertion, edema, orthopnea, palpitations, paroxysmal nocturnal dyspnea. Dermatological: No rash, lesions/masses Respiratory: No cough, dyspnea Urologic: No hematuria, dysuria Abdominal:   No nausea, vomiting, diarrhea, bright red blood per rectum, melena, or hematemesis Neurologic:  No visual changes, wkns, changes in mental status. All other systems reviewed and are otherwise negative except as noted above.   Physical Exam:    VS:  There were no vitals taken for this visit.   General: Well developed, well nourished,female appearing in no acute distress. Head: Normocephalic, atraumatic, sclera non-icteric, no xanthomas, nares are without discharge.  Neck: No carotid bruits. JVD not elevated.  Lungs: Respirations regular and unlabored, without wheezes or rales.  Heart: ***Regular rate and rhythm. No S3 or S4.  No murmur,  no rubs, or gallops appreciated. Abdomen: Soft, non-tender, non-distended with normoactive bowel sounds. No hepatomegaly. No rebound/guarding. No obvious abdominal masses. Msk:  Strength and tone appear normal for age. No joint deformities or effusions. Extremities: No clubbing or cyanosis. No edema.  Distal pedal pulses are 2+ bilaterally. Neuro: Alert and oriented X 3. Moves all extremities spontaneously. No focal deficits noted. Psych:  Responds to questions appropriately with a normal affect. Skin: No rashes or lesions noted  Wt Readings from Last 3 Encounters:  11/13/18 186 lb (84.4 kg)  08/20/18 199 lb 15.3 oz (90.7 kg)  07/31/18 201 lb 6.4 oz (91.4 kg)        Studies/Labs Reviewed:   EKG:  EKG is*** ordered today.  The ekg ordered today demonstrates ***  Recent Labs: 08/20/2018: TSH 3.283 08/25/2018: Magnesium 1.7 09/01/2018: ALT 18; BUN 19; Creatinine, Ser 1.70; Hemoglobin 10.8; Platelets 426; Potassium 4.3; Sodium 142   Lipid Panel    Component Value Date/Time   CHOL 197 07/31/2018 1649   TRIG 161 (H) 07/31/2018 1649   HDL 60 07/31/2018 1649   CHOLHDL 3.3 07/31/2018 1649   CHOLHDL 6.0 (H) 05/19/2018 1151   VLDL 21 10/10/2016 0141   LDLCALC 105 (H) 07/31/2018 1649   LDLCALC 180 (H) 05/19/2018 1151    Additional studies/ records that were reviewed today include:   Cardiac Catheterization: 05/2018  Prox RCA to Mid RCA lesion is 100% stenosed.  Prox RCA lesion is 60% stenosed.  Dist LM lesion is 60% stenosed.  Ost Cx to Prox Cx lesion is 50% stenosed.  Ost LAD to Prox LAD lesion is 30% stenosed.  Ost 1st Diag lesion is 50% stenosed.  Mid LAD lesion is 70% stenosed.  Prox LAD lesion is 30% stenosed.  Prox Cx to Mid Cx lesion is 20% stenosed.   Multivessel CAD with 60% eccentric distal left main stenosis extending into the ostium of the circumflex vessel; 30% calcified proximal LAD stenosis, 50% ostial diagonal stenosis, 30% mid LAD stenosis followed by 70%  mid-distal LAD stenosis; 20% proximal circumflex irregularity  and total occlusion of the proximal to mid RCA with 60% very proximal stenosis and evidence for both bridging antegrade collaterals as well as extensive left-to-right retrograde collaterals supplying the distal RCA, PDA and PLA  System.  LVEDP 24 mm Hg.  RECOMMENDATION: A 2D echo Doppler study be performed as an outpatient to assess for LV function.  The patient was significantly hypertensive.  Recommend increase anti-anginal regimen and will initiate nitrates and increased beta-blocker therapy.  There is reported allergy to amlodipine.  Recommend imminent follow-up with Dr. Bronson Ing.  Consider surgical consultation for potential CABG revascularization versus aggressive medical therapy trial.   Echocardiogram: 05/2018 Study Conclusions  - Left ventricle: The cavity size was normal. Moderate focal basal   septal hypertrophy and otherwise mild hypertrophy of remaining   myocardium. Systolic function was at the lower limits of normal.   The estimated ejection fraction was in the range of 50% to 55%.   Doppler parameters are consistent with abnormal left ventricular   relaxation (grade 1 diastolic dysfunction). Doppler parameters   are consistent with high ventricular filling pressure. - Regional wall motion abnormality: Hypokinesis of the entire   inferior and mid inferolateral myocardium; mild hypokinesis of   the apical lateral myocardium. - Aortic valve: Mildly calcified annulus. Trileaflet. - Mitral valve: Mildly calcified annulus. Mildly thickened leaflets   . - Left atrium: The atrium was mildly to moderately dilated. - Pulmonic valve: There was mild regurgitation.   Assessment:    No diagnosis found.   Plan:   In order of problems listed above:  1. CAD - s/p multivessel CAD by cath in 05/2018 and felt to be poor CABG candidate at that time due to her COPD and prior CVA's.   2. HTN - ***  3. HLD - ***   4. Type 2 DM - ***  5. COPD - ***   Medication Adjustments/Labs and Tests Ordered: Current medicines are reviewed at length with the patient today.  Concerns regarding medicines are outlined above.  Medication changes, Labs and Tests ordered today are listed in the Patient Instructions below. There are no Patient Instructions on file for this visit.   Signed, Erma Heritage, PA-C  11/28/2018 7:15 AM    [facility] [address] [place], [place] [address] Phone: [phone] Fax: [phone]

## 2018-12-01 ENCOUNTER — Encounter: Payer: Self-pay | Admitting: Student

## 2018-12-07 ENCOUNTER — Other Ambulatory Visit: Payer: Self-pay | Admitting: Family Medicine

## 2018-12-07 DIAGNOSIS — L237 Allergic contact dermatitis due to plants, except food: Secondary | ICD-10-CM

## 2018-12-11 DIAGNOSIS — Z8673 Personal history of transient ischemic attack (TIA), and cerebral infarction without residual deficits: Secondary | ICD-10-CM | POA: Diagnosis not present

## 2018-12-11 DIAGNOSIS — I69391 Dysphagia following cerebral infarction: Secondary | ICD-10-CM | POA: Diagnosis not present

## 2018-12-15 ENCOUNTER — Other Ambulatory Visit: Payer: Self-pay | Admitting: Family Medicine

## 2018-12-15 DIAGNOSIS — G8929 Other chronic pain: Secondary | ICD-10-CM

## 2018-12-18 ENCOUNTER — Other Ambulatory Visit: Payer: Self-pay | Admitting: Family Medicine

## 2018-12-20 ENCOUNTER — Telehealth: Payer: Self-pay | Admitting: Family Medicine

## 2018-12-20 NOTE — Telephone Encounter (Signed)
LMTCB

## 2018-12-21 ENCOUNTER — Other Ambulatory Visit: Payer: Self-pay | Admitting: *Deleted

## 2018-12-21 MED ORDER — TRAMADOL HCL 50 MG PO TABS: 50.0000 mg | ORAL_TABLET | Freq: Two times a day (BID) | ORAL | 0 refills | Status: AC | PRN

## 2018-12-21 NOTE — Telephone Encounter (Signed)
Husband aware.

## 2018-12-25 DIAGNOSIS — I639 Cerebral infarction, unspecified: Secondary | ICD-10-CM | POA: Diagnosis not present

## 2018-12-26 NOTE — Telephone Encounter (Signed)
Tramadol sent to pharmacy on 12/21/2018

## 2019-01-06 ENCOUNTER — Other Ambulatory Visit: Payer: Self-pay | Admitting: Family Medicine

## 2019-01-10 ENCOUNTER — Other Ambulatory Visit: Payer: Self-pay | Admitting: Family Medicine

## 2019-01-11 DIAGNOSIS — I69391 Dysphagia following cerebral infarction: Secondary | ICD-10-CM | POA: Diagnosis not present

## 2019-01-11 DIAGNOSIS — Z8673 Personal history of transient ischemic attack (TIA), and cerebral infarction without residual deficits: Secondary | ICD-10-CM | POA: Diagnosis not present

## 2019-01-22 ENCOUNTER — Other Ambulatory Visit: Payer: Self-pay | Admitting: Family

## 2019-01-23 ENCOUNTER — Other Ambulatory Visit: Payer: Self-pay | Admitting: Family Medicine

## 2019-01-23 DIAGNOSIS — M797 Fibromyalgia: Secondary | ICD-10-CM

## 2019-01-25 ENCOUNTER — Ambulatory Visit (INDEPENDENT_AMBULATORY_CARE_PROVIDER_SITE_OTHER): Payer: Medicare HMO | Admitting: *Deleted

## 2019-01-25 DIAGNOSIS — I639 Cerebral infarction, unspecified: Secondary | ICD-10-CM | POA: Diagnosis not present

## 2019-01-25 DIAGNOSIS — Z0000 Encounter for general adult medical examination without abnormal findings: Secondary | ICD-10-CM

## 2019-01-25 NOTE — Patient Instructions (Signed)
Preventive Care 38 Years and Older, Female Preventive care refers to lifestyle choices and visits with your health care provider that can promote health and wellness. This includes:  A yearly physical exam. This is also called an annual well check.  Regular dental and eye exams.  Immunizations.  Screening for certain conditions.  Healthy lifestyle choices, such as diet and exercise. What can I expect for my preventive care visit? Physical exam Your health care provider will check:  Height and weight. These may be used to calculate body mass index (BMI), which is a measurement that tells if you are at a healthy weight.  Heart rate and blood pressure.  Your skin for abnormal spots. Counseling Your health care provider may ask you questions about:  Alcohol, tobacco, and drug use.  Emotional well-being.  Home and relationship well-being.  Sexual activity.  Eating habits.  History of falls.  Memory and ability to understand (cognition).  Work and work Statistician.  Pregnancy and menstrual history. What immunizations do I need?  Influenza (flu) vaccine  This is recommended every year. Tetanus, diphtheria, and pertussis (Tdap) vaccine  You may need a Td booster every 10 years. Varicella (chickenpox) vaccine  You may need this vaccine if you have not already been vaccinated. Zoster (shingles) vaccine  You may need this after age 76. Pneumococcal conjugate (PCV13) vaccine  One dose is recommended after age 76. Pneumococcal polysaccharide (PPSV23) vaccine  One dose is recommended after age 76. Measles, mumps, and rubella (MMR) vaccine  You may need at least one dose of MMR if you were born in 1957 or later. You may also need a second dose. Meningococcal conjugate (MenACWY) vaccine  You may need this if you have certain conditions. Hepatitis A vaccine  You may need this if you have certain conditions or if you travel or work in places where you may be exposed  to hepatitis A. Hepatitis B vaccine  You may need this if you have certain conditions or if you travel or work in places where you may be exposed to hepatitis B. Haemophilus influenzae type b (Hib) vaccine  You may need this if you have certain conditions. You may receive vaccines as individual doses or as more than one vaccine together in one shot (combination vaccines). Talk with your health care provider about the risks and benefits of combination vaccines. What tests do I need? Blood tests  Lipid and cholesterol levels. These may be checked every 5 years, or more frequently depending on your overall health.  Hepatitis C test.  Hepatitis B test. Screening  Lung cancer screening. You may have this screening every year starting at age 76 if you have a 30-pack-year history of smoking and currently smoke or have quit within the past 15 years.  Colorectal cancer screening. All adults should have this screening starting at age 76 and continuing until age 15. Your health care provider may recommend screening at age 76 if you are at increased risk. You will have tests every 1-10 years, depending on your results and the type of screening test.  Diabetes screening. This is done by checking your blood sugar (glucose) after you have not eaten for a while (fasting). You may have this done every 1-3 years.  Mammogram. This may be done every 1-2 years. Talk with your health care provider about how often you should have regular mammograms.  BRCA-related cancer screening. This may be done if you have a family history of breast, ovarian, tubal, or peritoneal cancers.  Other tests  Sexually transmitted disease (STD) testing.  Bone density scan. This is done to screen for osteoporosis. You may have this done starting at age 76. Follow these instructions at home: Eating and drinking  Eat a diet that includes fresh fruits and vegetables, whole grains, lean protein, and low-fat dairy products. Limit  your intake of foods with high amounts of sugar, saturated fats, and salt.  Take vitamin and mineral supplements as recommended by your health care provider.  Do not drink alcohol if your health care provider tells you not to drink.  If you drink alcohol: ? Limit how much you have to 0-1 drink a day. ? Be aware of how much alcohol is in your drink. In the [place] one drink equals one 12 oz bottle of beer (355 mL), one 5 oz glass of wine (148 mL), or one 1 oz glass of hard liquor (44 mL). Lifestyle  Take daily care of your teeth and gums.  Stay active. Exercise for at least 30 minutes on 5 or more days each week.  Do not use any products that contain nicotine or tobacco, such as cigarettes, e-cigarettes, and chewing tobacco. If you need help quitting, ask your health care provider.  If you are sexually active, practice safe sex. Use a condom or other form of protection in order to prevent STIs (sexually transmitted infections).  Talk with your health care provider about taking a low-dose aspirin or statin. What's next?  Go to your health care provider once a year for a well check visit.  Ask your health care provider how often you should have your eyes and teeth checked.  Stay up to date on all vaccines. This information is not intended to replace advice given to you by your health care provider. Make sure you discuss any questions you have with your health care provider. Document Released: 05/30/2015 Document Revised: 04/27/2018 Document Reviewed: 04/27/2018 Elsevier Patient Education  2020 [employer].

## 2019-01-25 NOTE — Progress Notes (Addendum)
MEDICARE ANNUAL WELLNESS VISIT  01/25/2019  Telephone Visit Disclaimer This Medicare AWV was conducted by telephone due to national recommendations for restrictions regarding the COVID-19 Pandemic (e.g. social distancing).  I verified, using two identifiers, that I am speaking with Susan Patel or their authorized healthcare agent. I discussed the limitations, risks, security, and privacy concerns of performing an evaluation and management service by telephone and the potential availability of an in-person appointment in the future. The patient expressed understanding and agreed to proceed.   Subjective:  Susan Patel is a 78 y.o. female patient of Dettinger, Fransisca Kaufmann, MD who had a Medicare Annual Wellness Visit today via telephone. Tomeki is Retired and lives with their spouse. she has 2 children. she reports that she is socially active and does interact with friends/family regularly. she is not physically active and enjoys reading.  Patient Care Team: Dettinger, Fransisca Kaufmann, MD as PCP - General (Family Medicine) Herminio Commons, MD as PCP - Cardiology (Cardiology)  Advanced Directives 01/25/2019 08/20/2018 08/20/2018 02/23/2018 08/17/2017 08/17/2017 06/09/2017  Does Patient Have a Medical Advance Directive? No No No No (No Data) No No  Does patient want to make changes to medical advance directive? - - - - - - -  Would patient like information on creating a medical advance directive? No - Patient declined No - Patient declined No - Guardian declined - - No - Patient declined Yes (MAU/Ambulatory/Procedural Areas - Information given)    Hospital Utilization Over the Past 12 Months: # of hospitalizations or ER visits: 1 # of surgeries: 0  Review of Systems    Patient reports that her overall health is unchanged compared to last year.  History obtained from chart review  Patient Reported Readings (BP, Pulse, CBG, Weight, etc) none  Pain Assessment Pain : No/denies pain      Current Medications & Allergies (verified) Allergies as of 01/25/2019      Reactions   Azithromycin Other (See Comments)   Renal failure   Prednisone Other (See Comments)   Irritability and insomnia   Sulfamethoxazole-trimethoprim Other (See Comments)   Renal failure   Amlodipine Swelling   Caused swelling in legs   Ciprofloxacin Itching, Rash   Tape Rash      Medication List       Accurate as of January 25, 2019  1:51 PM. If you have any questions, ask your nurse or doctor.        STOP taking these medications   predniSONE 20 MG tablet Commonly known as: Deltasone     TAKE these medications   acetaminophen 325 MG tablet Commonly known as: Tylenol Take 2 tablets (650 mg total) by mouth every 6 (six) hours as needed. What changed: reasons to take this   albuterol 108 (90 Base) MCG/ACT inhaler Commonly known as: VENTOLIN HFA Inhale 1-2 puffs into the lungs every 6 (six) hours as needed for wheezing or shortness of breath.   aspirin 81 MG EC tablet TAKE 1 TABLET (81 MG TOTAL) BY MOUTH DAILY FOR 90 DAYS.   Breo Ellipta 200-25 MCG/INH Aepb Generic drug: fluticasone furoate-vilanterol TAKE 1 PUFF BY MOUTH EVERY DAY What changed: See the new instructions.   carvedilol 12.5 MG tablet Commonly known as: COREG TAKE 1 TABLET (12.5 MG TOTAL) BY MOUTH 2 (TWO) TIMES DAILY.   clopidogrel 75 MG tablet Commonly known as: PLAVIX Take 1 tablet (75 mg total) by mouth daily.   Dulaglutide 0.75 MG/0.5ML Sopn Commonly known as: Trulicity  Inject 0.75 mg into the skin once a week.   famotidine 10 MG tablet Commonly known as: PEPCID Take 1 tablet (10 mg total) by mouth daily as needed for heartburn or indigestion.   fluticasone 50 MCG/ACT nasal spray Commonly known as: FLONASE SPRAY 2 SPRAYS INTO EACH NOSTRIL EVERY DAY   glucose blood test strip Use as instructed   isosorbide mononitrate 30 MG 24 hr tablet Commonly known as: IMDUR Take 0.5 tablets (15 mg total) by mouth  daily.   latanoprost 0.005 % ophthalmic solution Commonly known as: XALATAN Place 1 drop into both eyes at bedtime.   montelukast 10 MG tablet Commonly known as: SINGULAIR Take 1 tablet (10 mg total) by mouth at bedtime as needed (allergies). 1 tablet daily   nitroGLYCERIN 0.4 MG SL tablet Commonly known as: NITROSTAT Place 1 tablet (0.4 mg total) under the tongue every 5 (five) minutes as needed.   onetouch ultrasoft lancets Use as instructed   OneTouch Delica Lancets Fine Misc TEST TWICE DAILY   OXYGEN Inhale 3-4 L into the lungs at bedtime.   rosuvastatin 10 MG tablet Commonly known as: CRESTOR Take 1 tablet (10 mg total) by mouth every evening. (Please make 6 mos appt)   traZODone 50 MG tablet Commonly known as: DESYREL Take 0.5 tablets (25 mg total) by mouth at bedtime as needed for sleep.   triamcinolone cream 0.1 % Commonly known as: KENALOG APPLY TO AFFECTED AREA TWICE A DAY   vitamin B-12 1000 MCG tablet Commonly known as: CYANOCOBALAMIN Take 1,000 mcg by mouth daily.       History (reviewed): Past Medical History:  Diagnosis Date  . Arthritis    'all over"  . Asthma   . Chronic pain   . COPD (chronic obstructive pulmonary disease) (HCC)    O2 per [place] at nights   . Depression   . Diabetes mellitus without complication ([place])    borderline- , states she was on med., but MD told her "everything is under control so I threw the bottle away"  . Fibromyalgia   . GERD (gastroesophageal reflux disease)   . Hypertension   . Neuromuscular disorder (HCC)    parkinson, neuropathy- both feet & hands.  . 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 ABDOMINAL HYSTERECTOMY    . ANTERIOR CERVICAL DECOMP/DISCECTOMY FUSION N/A 07/10/2015   Procedure: Cervical five-six, Cervical six-seven anterior cervical decompression with fusion plating and bonegraft;  Surgeon: Jovita Gamma, MD;  Location: [facility];  Service:  Neurosurgery;  Laterality: N/A;  . APPENDECTOMY    . BIOPSY EYE MUSCLE  03/20/2016   biopsy vessel to right eye due to swelling  . EYE SURGERY Bilateral    cataracts removed, /w "cyrstal lenses"   . LEFT HEART CATH AND CORONARY ANGIOGRAPHY N/A 05/23/2018   Procedure: LEFT HEART CATH AND CORONARY ANGIOGRAPHY;  Surgeon: Troy Sine, MD;  Location: [facility];  Service: Cardiovascular;  Laterality: N/A;  . SHOULDER ARTHROSCOPY Right    x2   RCR- spurs removed    Family History  Problem Relation Age of Onset  . Heart disease Mother   . Cancer Father   . Heart disease Father   . Arthritis Sister   . Heart failure Sister   . Cancer Brother   . Thyroid cancer Son   . Atrial fibrillation Son   . Arthritis Sister    Social History   Socioeconomic History  .  Marital status: Married    Spouse name: wayne  . Number of children: 2  . Years of education: 16  . Highest education level: Bachelor's degree (e.g., BA, AB, BS)  Occupational History  . Occupation: retired    Comment: Automotive engineer  Social Needs  . Financial resource strain: Not hard at all  . Food insecurity    Worry: Never true    Inability: Never true  . Transportation needs    Medical: No    Non-medical: No  Tobacco Use  . Smoking status: Never Smoker  . Smokeless tobacco: Never Used  Substance and Sexual Activity  . Alcohol use: No  . Drug use: No  . Sexual activity: Yes  Lifestyle  . Physical activity    Days per week: 0 days    Minutes per session: 0 min  . Stress: Not at all  Relationships  . Social connections    Talks on phone: More than three times a week    Gets together: More than three times a week    Attends religious service: More than 4 times per year    Active member of club or organization: Yes    Attends meetings of clubs or organizations: More than 4 times per year    Relationship status: Married  Other Topics Concern  . Not on file  Social History Narrative  . Not  on file    Activities of Daily Living In your present state of health, do you have any difficulty performing the following activities: 01/25/2019 08/20/2018  Hearing? N N  Vision? Y N  Comment wears reading glasses -  Difficulty concentrating or making decisions? N Y  Walking or climbing stairs? N Y  Dressing or bathing? N N  Doing errands, shopping? Tempie Donning  Comment someone brings her to all doctor appointments -  Preparing Food and eating ? N -  Using the Toilet? N -  In the past six months, have you accidently leaked urine? Y -  Comment wears pad/depends -  Do you have problems with loss of bowel control? N -  Managing your Medications? N -  Managing your Finances? N -  Housekeeping or managing your Housekeeping? Y -  Comment someone comes in once a week to clean the house -  Some recent data might be hidden    Patient Education/ Literacy How often do you need to have someone help you when you read instructions, pamphlets, or other written materials from your doctor or pharmacy?: 1 - Never What is the last grade level you completed in school?: Bachelors Degree  Exercise Current Exercise Habits: The patient does not participate in regular exercise at present, Exercise limited by: cardiac condition(s);orthopedic condition(s)  Diet Patient reports consuming 2 meals a day and 1 snack(s) a day Patient reports that her primary diet is: Regular Patient reports that she does have regular access to food.   Depression Screen PHQ 2/9 Scores 01/25/2019 11/13/2018 07/31/2018 06/29/2018 06/26/2018 06/20/2018 06/09/2018  PHQ - 2 Score 0 0 0 0 0 0 0  PHQ- 9 Score - - - - - - -     Fall Risk Fall Risk  01/25/2019 11/13/2018 06/26/2018 06/20/2018 06/09/2018  Falls in the past year? 1 0 1 1 1   Comment - - - - -  Number falls in past yr: 1 - 1 1 1   Injury with Fall? 0 - 0 1 1  Comment - - - - -  Risk Factor Category  - - - - -  Risk for fall due to : History of fall(s);Impaired balance/gait - - - -   Risk for fall due to: Comment - - - - -  Follow up Falls prevention discussed - - - -  Comment get rid of all throw rugs in the house, adequate lighting in the walkways and grab bars in the bathroom - - - -     Objective:  Susan Patel seemed alert and oriented and she participated appropriately during our telephone visit.  Blood Pressure Weight BMI  BP Readings from Last 3 Encounters:  11/13/18 (!) 152/88  09/01/18 (!) 152/79  08/26/18 (!) 174/72   Wt Readings from Last 3 Encounters:  11/13/18 186 lb (84.4 kg)  08/20/18 199 lb 15.3 oz (90.7 kg)  07/31/18 201 lb 6.4 oz (91.4 kg)   BMI Readings from Last 1 Encounters:  11/13/18 30.95 kg/m    *Unable to obtain current vital signs, weight, and BMI due to telephone visit type  Hearing/Vision  . Callee did not seem to have difficulty with hearing/understanding during the telephone conversation . Reports that she has not had a formal eye exam by an eye care professional within the past year . Reports that she has not had a formal hearing evaluation within the past year *Unable to fully assess hearing and vision during telephone visit type  Cognitive Function: 6CIT Screen 01/25/2019  What Year? 0 points  What month? 0 points  What time? 0 points  Count back from 20 0 points  Months in reverse 0 points  Repeat phrase 2 points  Total Score 2   (Normal:0-7, Significant for Dysfunction: >8)  Normal Cognitive Function Screening: Yes   Immunization & Health Maintenance Record Immunization History  Administered Date(s) Administered  . Influenza Split 02/14/2018  . Influenza, High Dose Seasonal PF 03/23/2017  . Influenza,inj,Quad PF,6+ Mos 03/21/2016  . Influenza-Unspecified 05/01/2012, 06/12/2013, 04/04/2014  . Pneumococcal Conjugate-13 04/04/2014  . Pneumococcal Polysaccharide-23 04/25/2006, 03/27/2009  . Tdap 05/31/2011    Health Maintenance  Topic Date Due  . OPHTHALMOLOGY EXAM  06/17/2016  . FOOT EXAM  09/03/2016  .  URINE MICROALBUMIN  09/03/2016  . INFLUENZA VACCINE  12/16/2018  . HEMOGLOBIN A1C  02/19/2019  . TETANUS/TDAP  05/30/2021  . DEXA SCAN  Completed  . PNA vac Low Risk Adult  Completed  . COLONOSCOPY  Discontinued       Assessment  This is a routine wellness examination for Susan Patel.  Health Maintenance: Due or Overdue Health Maintenance Due  Topic Date Due  . OPHTHALMOLOGY EXAM  06/17/2016  . FOOT EXAM  09/03/2016  . URINE MICROALBUMIN  09/03/2016  . INFLUENZA VACCINE  12/16/2018    Susan Patel does not need a referral for Community Assistance: Care Management:   no Social Work:    no Prescription Assistance:  no Nutrition/Diabetes Education:  no   Plan:  Personalized Goals Goals Addressed            This Visit's Progress   . DIET - EAT MORE FRUITS AND VEGETABLES        Personalized Health Maintenance & Screening Recommendations  Influenza vaccine Shingles vaccine  Lung Cancer Screening Recommended: no (Low Dose CT Chest recommended if Age 63-80 years, 30 pack-year currently smoking OR have quit w/in past 15 years) Hepatitis C Screening recommended: no HIV Screening recommended: no  Advanced Directives: Written information was not prepared per patient's request.  Referrals & Orders No orders of the defined types were placed  in this encounter.   Follow-up Plan . Follow-up with Dettinger, Fransisca Kaufmann, MD as planned . Consider Flu and Shingles vaccines at your next visit with your PCP   I have personally reviewed and noted the following in the patient's chart:   . Medical and social history . Use of alcohol, tobacco or illicit drugs  . Current medications and supplements . Functional ability and status . Nutritional status . Physical activity . Advanced directives . List of other physicians . Hospitalizations, surgeries, and ER visits in previous 12 months . Vitals . Screenings to include cognitive, depression, and falls . Referrals and  appointments  In addition, I have reviewed and discussed with Susan Patel certain preventive protocols, quality metrics, and best practice recommendations. A written personalized care plan for preventive services as well as general preventive health recommendations is available and can be mailed to the patient at her request.      Marylin Crosby, LPN  [phone]   I have reviewed and agree with the above AWV documentation.    Evelina Dun, FNP

## 2019-02-11 DIAGNOSIS — Z8673 Personal history of transient ischemic attack (TIA), and cerebral infarction without residual deficits: Secondary | ICD-10-CM | POA: Diagnosis not present

## 2019-02-11 DIAGNOSIS — I69391 Dysphagia following cerebral infarction: Secondary | ICD-10-CM | POA: Diagnosis not present

## 2019-02-13 ENCOUNTER — Telehealth: Payer: Self-pay | Admitting: Family Medicine

## 2019-02-13 NOTE — Telephone Encounter (Signed)
Appointment scheduled 02/14/2019

## 2019-02-14 ENCOUNTER — Ambulatory Visit (INDEPENDENT_AMBULATORY_CARE_PROVIDER_SITE_OTHER): Payer: Medicare HMO | Admitting: Family Medicine

## 2019-02-14 ENCOUNTER — Encounter: Payer: Self-pay | Admitting: Family Medicine

## 2019-02-14 DIAGNOSIS — L237 Allergic contact dermatitis due to plants, except food: Secondary | ICD-10-CM

## 2019-02-14 DIAGNOSIS — J0100 Acute maxillary sinusitis, unspecified: Secondary | ICD-10-CM

## 2019-02-14 MED ORDER — AMOXICILLIN 500 MG PO CAPS: 500.0000 mg | ORAL_CAPSULE | Freq: Two times a day (BID) | ORAL | 0 refills | Status: DC

## 2019-02-14 MED ORDER — TRIAMCINOLONE ACETONIDE 0.1 % EX CREA: TOPICAL_CREAM | Freq: Two times a day (BID) | CUTANEOUS | 0 refills | Status: DC

## 2019-02-14 MED ORDER — TRAMADOL HCL 50 MG PO TABS: 50.0000 mg | ORAL_TABLET | Freq: Three times a day (TID) | ORAL | 2 refills | Status: DC | PRN

## 2019-02-14 NOTE — Progress Notes (Signed)
   Virtual Visit via telephone Note  I connected with Susan Patel on 02/14/19 at 1436 by telephone and verified that I am speaking with the correct person using two identifiers. Susan Patel is currently located at home and husband [name] are currently with her during visit. The provider, Fransisca Kaufmann Dettinger, MD is located in their office at time of visit.  Call ended at 1450  I discussed the limitations, risks, security and privacy concerns of performing an evaluation and management service by telephone and the availability of in person appointments. I also discussed with the patient that there may be a patient responsible charge related to this service. The patient expressed understanding and agreed to proceed.   History and Present Illness: Patient is calling with complaints of coughing and sneezing.  She denies fever or chills or difficulty breathing. She has sinus pressure in her head and it has been going on for 2 weeks.  She denies any sick contacts.  She denies covid contacts.  She only goes to church.  She has a cough but it is dry and rattle cough.  She tried a cough medicine and her inhaler but they are not helping significantly.       Review of Systems  Constitutional: Negative for chills and fever.  HENT: Positive for congestion, postnasal drip, sinus pressure and sneezing. Negative for ear discharge, ear pain, rhinorrhea and sore throat.   Eyes: Negative for pain, redness and visual disturbance.  Respiratory: Positive for cough. Negative for chest tightness, shortness of breath and wheezing.   Cardiovascular: Negative for chest pain and leg swelling.  Genitourinary: Negative for difficulty urinating and dysuria.  Musculoskeletal: Negative for back pain and gait problem.  Skin: Negative for rash.  Neurological: Negative for light-headedness and headaches.  Psychiatric/Behavioral: Negative for agitation and behavioral problems.  All other systems reviewed and are negative.    Observations/Objective: Patient sounds comfortable and in no acute distress  Assessment and Plan: Problem List Items Addressed This Visit    None    Visit Diagnoses    Acute maxillary sinusitis, recurrence not specified    -  Primary   Relevant Medications   amoxicillin (AMOXIL) 500 MG capsule   Poison oak dermatitis       Relevant Medications   triamcinolone cream (KENALOG) 0.1 %       Follow Up Instructions:  Recommend quarantine and recommend covid testing.   Will send antibiotic   I discussed the assessment and treatment plan with the patient. The patient was provided an opportunity to ask questions and all were answered. The patient agreed with the plan and demonstrated an understanding of the instructions.   The patient was advised to call back or seek an in-person evaluation if the symptoms worsen or if the condition fails to improve as anticipated.  The above assessment and management plan was discussed with the patient. The patient verbalized understanding of and has agreed to the management plan. Patient is aware to call the clinic if symptoms persist or worsen. Patient is aware when to return to the clinic for a follow-up visit. Patient educated on when it is appropriate to go to the emergency department.    I provided 14 minutes of non-face-to-face time during this encounter.    Worthy Rancher, MD

## 2019-02-15 ENCOUNTER — Ambulatory Visit: Payer: Medicare HMO | Admitting: Family Medicine

## 2019-02-16 ENCOUNTER — Other Ambulatory Visit: Payer: Self-pay

## 2019-02-16 DIAGNOSIS — Z20822 Contact with and (suspected) exposure to covid-19: Secondary | ICD-10-CM

## 2019-02-17 LAB — NOVEL CORONAVIRUS, NAA: SARS-CoV-2, NAA: NOT DETECTED

## 2019-02-21 DIAGNOSIS — M170 Bilateral primary osteoarthritis of knee: Secondary | ICD-10-CM | POA: Diagnosis not present

## 2019-02-23 ENCOUNTER — Other Ambulatory Visit: Payer: Self-pay | Admitting: Family Medicine

## 2019-02-24 DIAGNOSIS — I639 Cerebral infarction, unspecified: Secondary | ICD-10-CM | POA: Diagnosis not present

## 2019-02-25 ENCOUNTER — Other Ambulatory Visit: Payer: Self-pay | Admitting: Family Medicine

## 2019-03-13 DIAGNOSIS — I69391 Dysphagia following cerebral infarction: Secondary | ICD-10-CM | POA: Diagnosis not present

## 2019-03-13 DIAGNOSIS — Z8673 Personal history of transient ischemic attack (TIA), and cerebral infarction without residual deficits: Secondary | ICD-10-CM | POA: Diagnosis not present

## 2019-03-14 ENCOUNTER — Other Ambulatory Visit: Payer: Self-pay | Admitting: Cardiovascular Disease

## 2019-03-27 ENCOUNTER — Other Ambulatory Visit: Payer: Self-pay | Admitting: Family Medicine

## 2019-04-01 ENCOUNTER — Other Ambulatory Visit: Payer: Self-pay | Admitting: Family Medicine

## 2019-04-01 DIAGNOSIS — K5900 Constipation, unspecified: Secondary | ICD-10-CM

## 2019-04-19 ENCOUNTER — Other Ambulatory Visit: Payer: Self-pay | Admitting: Family Medicine

## 2019-04-26 ENCOUNTER — Ambulatory Visit (INDEPENDENT_AMBULATORY_CARE_PROVIDER_SITE_OTHER): Payer: Medicare HMO | Admitting: Family Medicine

## 2019-04-26 ENCOUNTER — Encounter: Payer: Self-pay | Admitting: Family Medicine

## 2019-04-26 DIAGNOSIS — J40 Bronchitis, not specified as acute or chronic: Secondary | ICD-10-CM

## 2019-04-26 DIAGNOSIS — J329 Chronic sinusitis, unspecified: Secondary | ICD-10-CM

## 2019-04-26 MED ORDER — AMOXICILLIN-POT CLAVULANATE 875-125 MG PO TABS: 1.0000 | ORAL_TABLET | Freq: Two times a day (BID) | ORAL | 0 refills | Status: DC

## 2019-04-26 NOTE — Progress Notes (Signed)
    Subjective:    Patient ID: Susan Patel, female    DOB: Oct 09, 1940, 78 y.o.   MRN: [id number]   HPI: Susan Patel is a 78 y.o. female presenting for "I have bronchitis." Denies fever. She is coughing. Nonproductive. Onset 1 week ago. She does feel some dyspnea. She is eating okay. Also sinus pressure and drainage   Depression screen [facility] 2/9 01/25/2019 11/13/2018 07/31/2018 06/29/2018 06/26/2018  Decreased Interest 0 0 0 0 0  Down, Depressed, Hopeless 0 0 0 0 0  PHQ - 2 Score 0 0 0 0 0  Altered sleeping - - - - -  Tired, decreased energy - - - - -  Change in appetite - - - - -  Feeling bad or failure about yourself  - - - - -  Trouble concentrating - - - - -  Moving slowly or fidgety/restless - - - - -  Suicidal thoughts - - - - -  PHQ-9 Score - - - - -  Difficult doing work/chores - - - - -  Some recent data might be hidden     Relevant past medical, surgical, family and social history reviewed and updated as indicated.  Interim medical history since our last visit reviewed. Allergies and medications reviewed and updated.  ROS:  Review of Systems  Unable to perform ROS: Acuity of condition     Social History   Tobacco Use  Smoking Status Never Smoker  Smokeless Tobacco Never Used       Objective:     Wt Readings from Last 3 Encounters:  11/13/18 186 lb (84.4 kg)  08/20/18 199 lb 15.3 oz (90.7 kg)  07/31/18 201 lb 6.4 oz (91.4 kg)     Exam deferred. Pt. Harboring due to COVID 19. Phone visit performed.   Assessment & Plan:  No diagnosis found.  No orders of the defined types were placed in this encounter.   No orders of the defined types were placed in this encounter.     There are no diagnoses linked to this encounter.  Virtual Visit via telephone Note  I discussed the limitations, risks, security and privacy concerns of performing an evaluation and management service by telephone and the availability of in person appointments. The patient was  identified with two identifiers. Pt.expressed understanding and agreed to proceed. Pt. Is at home. Dr. Livia Snellen is in his office.  Follow Up Instructions:   I discussed the assessment and treatment plan with the patient. The patient was provided an opportunity to ask questions and all were answered. The patient agreed with the plan and demonstrated an understanding of the instructions.   The patient was advised to call back or seek an in-person evaluation if the symptoms worsen or if the condition fails to improve as anticipated.   Total minutes including chart review and phone contact time: 10   Follow up plan: No follow-ups on file.  Claretta Fraise, MD [place]

## 2019-05-03 ENCOUNTER — Telehealth: Payer: Self-pay | Admitting: Family Medicine

## 2019-05-03 MED ORDER — PREDNISONE 20 MG PO TABS: ORAL_TABLET | ORAL | 0 refills | Status: DC

## 2019-05-03 MED ORDER — BENZONATATE 100 MG PO CAPS: 100.0000 mg | ORAL_CAPSULE | Freq: Two times a day (BID) | ORAL | 0 refills | Status: DC | PRN

## 2019-05-03 NOTE — Telephone Encounter (Signed)
Patient is calling for cough and wheezing, sent prednisone and Tessalon Perles.

## 2019-05-03 NOTE — Telephone Encounter (Signed)
Had televisit with stacks on 04/27/19. Husband states its her bronchitis. He is asking for prednisone and something for cough been going on for a week

## 2019-05-03 NOTE — Telephone Encounter (Signed)
Patient aware and verbalized understanding. °

## 2019-05-05 ENCOUNTER — Other Ambulatory Visit: Payer: Self-pay | Admitting: Family Medicine

## 2019-05-12 ENCOUNTER — Other Ambulatory Visit: Payer: Self-pay | Admitting: Family Medicine

## 2019-05-12 DIAGNOSIS — R059 Cough, unspecified: Secondary | ICD-10-CM

## 2019-05-12 DIAGNOSIS — R05 Cough: Secondary | ICD-10-CM

## 2019-05-13 ENCOUNTER — Other Ambulatory Visit: Payer: Self-pay | Admitting: Family Medicine

## 2019-05-15 ENCOUNTER — Telehealth: Payer: Self-pay | Admitting: Family Medicine

## 2019-05-15 NOTE — Telephone Encounter (Signed)
Needs televisit for abx

## 2019-05-15 NOTE — Telephone Encounter (Signed)
Patients husband called he is wanting something to knock out patients bronchitis states she gets it every year around this time. He states she coughs a little very fatigued no appetite not drinking any fluids he cant get her too. He is asking for a med to be called into [facility] in [place]. Please advise. Patient has history of strokes.

## 2019-05-15 NOTE — Telephone Encounter (Signed)
Televisit made for tomorrow

## 2019-05-16 ENCOUNTER — Ambulatory Visit (INDEPENDENT_AMBULATORY_CARE_PROVIDER_SITE_OTHER): Payer: Medicare HMO | Admitting: Family Medicine

## 2019-05-16 DIAGNOSIS — R627 Adult failure to thrive: Secondary | ICD-10-CM | POA: Diagnosis not present

## 2019-05-16 NOTE — Progress Notes (Signed)
Virtual Visit via Telephone Note  I connected with Susan Patel and her husband on 05/16/19 at 2:30 PM by telephone and verified that I am speaking with the correct person using two identifiers. Susan Patel is currently located at home and her husband is currently with her during this visit. The provider, Loman Brooklyn, FNP is located in their home at time of visit.  I discussed the limitations, risks, security and privacy concerns of performing an evaluation and management service by telephone and the availability of in person appointments. I also discussed with the patient that there may be a patient responsible charge related to this service. The patient expressed understanding and agreed to proceed.  Subjective: PCP: Dettinger, Fransisca Kaufmann, MD  Chief Complaint  Patient presents with  . 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  She is also not responding as well as usual.  Her sister or her niece sit with her throughout the day.  Patient does not have any respiratory symptoms.  She verbally denies feeling depressed to her husband when asked.  Although she is not eating or drinking very well she does continue to urinate 2-3 times a day.  There are no other specific complaints.   ROS: Per HPI  Current Outpatient Medications:  .  acetaminophen (TYLENOL) 325 MG tablet, Take 2 tablets (650 mg total) by mouth every 6 (six) hours as needed. (Patient taking differently: Take 650 mg by mouth every 6 (six) hours as needed for moderate pain or headache. ), Disp: 30 tablet, Rfl: 0 .  albuterol (PROVENTIL HFA;VENTOLIN HFA) 108 (90 Base) MCG/ACT inhaler, Inhale 1-2 puffs into the lungs every 6 (six) hours as needed for wheezing or shortness of breath., Disp: 1 Inhaler, Rfl: 1 .  amoxicillin (AMOXIL) 500 MG capsule, Take 1 capsule (500 mg total) by mouth 2 (two) times daily., Disp: 20 capsule, Rfl: 0 .   amoxicillin-clavulanate (AUGMENTIN) 875-125 MG tablet, Take 1 tablet by mouth 2 (two) times daily. Take all of this medication, Disp: 20 tablet, Rfl: 0 .  aspirin 81 MG EC tablet, TAKE 1 TABLET (81 MG TOTAL) BY MOUTH DAILY FOR 90 DAYS., Disp: 90 tablet, Rfl: 0 .  benzonatate (TESSALON PERLES) 100 MG capsule, Take 1 capsule (100 mg total) by mouth 2 (two) times daily as needed for cough., Disp: 20 capsule, Rfl: 0 .  BREO ELLIPTA 200-25 MCG/INH AEPB, TAKE 1 PUFF BY MOUTH EVERY DAY (Patient taking differently: Inhale 1 puff into the lungs daily. ), Disp: 60 each, Rfl: 4 .  carvedilol (COREG) 12.5 MG tablet, Take 1 tablet (12.5 mg total) by mouth 2 (two) times daily. (Needs to be seen before next refill), Disp: 60 tablet, Rfl: 0 .  clopidogrel (PLAVIX) 75 MG tablet, Take 1 tablet (75 mg total) by mouth daily., Disp: 90 tablet, Rfl: 3 .  Dulaglutide (TRULICITY) A999333 0000000 SOPN, Inject 0.75 mg into the skin once a week., Disp: 12 pen, Rfl: 3 .  famotidine (PEPCID) 10 MG tablet, Take 1 tablet (10 mg total) by mouth daily as needed for heartburn or indigestion., Disp: , Rfl:  .  fluticasone (FLONASE) 50 MCG/ACT nasal spray, SPRAY 2 SPRAYS INTO EACH NOSTRIL EVERY DAY, Disp: 48 mL, Rfl: 1 .  glucose blood test strip, Use as instructed, Disp: 100 each, Rfl: 2 .  isosorbide mononitrate (IMDUR) 30 MG 24 hr tablet, Take 0.5  tablets (15 mg total) by mouth daily., Disp: 90 tablet, Rfl: 3 .  Lancets (ONETOUCH ULTRASOFT) lancets, Use as instructed, Disp: 100 each, Rfl: 12 .  latanoprost (XALATAN) 0.005 % ophthalmic solution, Place 1 drop into both eyes at bedtime., Disp: 2.5 mL, Rfl: 0 .  LINZESS 290 MCG CAPS capsule, TAKE 1 CAPSULE BY MOUTH EVERY DAY BEFORE BREAKFAST, Disp: 90 capsule, Rfl: 1 .  montelukast (SINGULAIR) 10 MG tablet, Take 1 tablet (10 mg total) by mouth at bedtime as needed (allergies). 1 tablet daily, Disp: 90 tablet, Rfl: 3 .  nitroGLYCERIN (NITROSTAT) 0.4 MG SL tablet, PLACE 1 TABLET (0.4 MG  TOTAL) UNDER THE TONGUE EVERY 5 (FIVE) MINUTES AS NEEDED., Disp: 25 tablet, Rfl: 3 .  ONETOUCH DELICA LANCETS FINE MISC, TEST TWICE DAILY, Disp: 100 each, Rfl: 9 .  OXYGEN, Inhale 3-4 L into the lungs at bedtime., Disp: , Rfl:  .  predniSONE (DELTASONE) 20 MG tablet, 2 po at same time daily for 5 days, Disp: 10 tablet, Rfl: 0 .  rosuvastatin (CRESTOR) 10 MG tablet, TAKE 1 TABLET (10 MG TOTAL) BY MOUTH EVERY EVENING. (PLEASE MAKE 6 MOS APPT), Disp: 90 tablet, Rfl: 0 .  traMADol (ULTRAM) 50 MG tablet, Take 1 tablet (50 mg total) by mouth every 8 (eight) hours as needed., Disp: 60 tablet, Rfl: 2 .  traZODone (DESYREL) 50 MG tablet, Take 0.5 tablets (25 mg total) by mouth at bedtime as needed for sleep., Disp: 90 tablet, Rfl: 3 .  triamcinolone cream (KENALOG) 0.1 %, Apply topically 2 (two) times daily., Disp: 454 g, Rfl: 0 .  vitamin B-12 (CYANOCOBALAMIN) 1000 MCG tablet, Take 1,000 mcg by mouth daily., Disp: , Rfl:   Allergies  Allergen Reactions  . Azithromycin Other (See Comments)    Renal failure  . Prednisone Other (See Comments)    Irritability and insomnia  . Sulfamethoxazole-Trimethoprim Other (See Comments)    Renal failure  . Amlodipine Swelling    Caused swelling in legs  . Ciprofloxacin Itching and Rash  . Tape Rash   Past Medical History:  Diagnosis Date  . Arthritis    'all over"  . Asthma   . Chronic pain   . COPD (chronic obstructive pulmonary disease) (HCC)    O2 per [place] at nights   . Depression   . Diabetes mellitus without complication ([place])    borderline- , states she was on med., but MD told her "everything is under control so I threw the bottle away"  . Fibromyalgia   . GERD (gastroesophageal reflux disease)   . Hypertension   . Neuromuscular disorder (HCC)    parkinson, neuropathy- both feet & hands.  . 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 Very difficult to understand over the phone.  No respiratory distress or wheezing  audible over the phone  Assessment and Plan: 1. Failure to thrive in adult - Discussed failure to thrive with the patient's husband.  He does feel this is what is going on.  Discussed with him that without any symptoms of anything going on there is nothing I can treat at this time.  Encouraged them to push p.o. fluids to keep her hydrated.  I did advise that he schedule an appointment with her PCP as soon as he can get in as she is due for a routine follow-up with labs.   Follow Up Instructions:  I discussed the assessment and treatment plan with the patient. The patient was provided an  opportunity to ask questions and all were answered. The patient agreed with the plan and demonstrated an understanding of the instructions.   The patient was advised to call back or seek an in-person evaluation if the symptoms worsen or if the condition fails to improve as anticipated.  The above assessment and management plan was discussed with the patient. The patient verbalized understanding of and has agreed to the management plan. Patient is aware to call the clinic if symptoms persist or worsen. Patient is aware when to return to the clinic for a follow-up visit. Patient educated on when it is appropriate to go to the emergency department.   Time call ended: 2:45 PM  I provided 18 minutes of non-face-to-face time during this encounter.  Hendricks Limes, MSN, APRN, FNP-C [facility] 05/16/19

## 2019-05-19 ENCOUNTER — Other Ambulatory Visit: Payer: Self-pay | Admitting: Family Medicine

## 2019-05-19 DIAGNOSIS — J40 Bronchitis, not specified as acute or chronic: Secondary | ICD-10-CM

## 2019-05-19 MED ORDER — PREDNISONE 10 MG (21) PO TBPK: ORAL_TABLET | ORAL | 0 refills | Status: DC

## 2019-05-19 NOTE — Progress Notes (Signed)
brochitis

## 2019-05-20 ENCOUNTER — Encounter: Payer: Self-pay | Admitting: Family Medicine

## 2019-05-22 ENCOUNTER — Ambulatory Visit (INDEPENDENT_AMBULATORY_CARE_PROVIDER_SITE_OTHER): Payer: Medicare HMO | Admitting: Family Medicine

## 2019-05-22 ENCOUNTER — Other Ambulatory Visit: Payer: Self-pay

## 2019-05-22 DIAGNOSIS — R05 Cough: Secondary | ICD-10-CM

## 2019-05-22 DIAGNOSIS — R059 Cough, unspecified: Secondary | ICD-10-CM

## 2019-05-22 DIAGNOSIS — Z20822 Contact with and (suspected) exposure to covid-19: Secondary | ICD-10-CM

## 2019-05-22 MED ORDER — HYDROCODONE-HOMATROPINE 5-1.5 MG/5ML PO SYRP: 5.0000 mL | ORAL_SOLUTION | Freq: Four times a day (QID) | ORAL | 0 refills | Status: DC | PRN

## 2019-05-22 NOTE — Progress Notes (Signed)
Virtual Visit via Telephone Note  I connected with Susan Patel's husband on 05/22/19 at 3:40 PM by telephone and verified that I am speaking with the correct person using two identifiers. Susan Patel is currently located at home and her husband is currently with her during this visit. The provider, Loman Brooklyn, FNP is located in their office at time of visit.  I discussed the limitations, risks, security and privacy concerns of performing an evaluation and management service by telephone and the availability of in person appointments. I also discussed with the patient that there may be a patient responsible charge related to this service. The patient expressed understanding and agreed to proceed.  Subjective: PCP: Dettinger, Fransisca Kaufmann, MD  Chief Complaint  Patient presents with  . Headache  . Cough  . Shortness of Breath   Patient complains of cough, headache, fever and shortness of breath. Onset of symptoms was several days ago. She is not drinking much. Evaluation to date: none. Treatment to date: none. She has a history of asthma. She does not smoke. Patient has had recent close contact with someone who has tested positive for COVID-19 as her husband has COVID-19.    ROS: Per HPI  Current Outpatient Medications:  .  acetaminophen (TYLENOL) 325 MG tablet, Take 2 tablets (650 mg total) by mouth every 6 (six) hours as needed. (Patient taking differently: Take 650 mg by mouth every 6 (six) hours as needed for moderate pain or headache. ), Disp: 30 tablet, Rfl: 0 .  albuterol (PROVENTIL HFA;VENTOLIN HFA) 108 (90 Base) MCG/ACT inhaler, Inhale 1-2 puffs into the lungs every 6 (six) hours as needed for wheezing or shortness of breath., Disp: 1 Inhaler, Rfl: 1 .  aspirin 81 MG EC tablet, TAKE 1 TABLET (81 MG TOTAL) BY MOUTH DAILY FOR 90 DAYS., Disp: 90 tablet, Rfl: 0 .  BREO ELLIPTA 200-25 MCG/INH AEPB, TAKE 1 PUFF BY MOUTH EVERY DAY (Patient taking differently: Inhale 1 puff into the  lungs daily. ), Disp: 60 each, Rfl: 4 .  carvedilol (COREG) 12.5 MG tablet, Take 1 tablet (12.5 mg total) by mouth 2 (two) times daily. (Needs to be seen before next refill), Disp: 60 tablet, Rfl: 0 .  clopidogrel (PLAVIX) 75 MG tablet, Take 1 tablet (75 mg total) by mouth daily., Disp: 90 tablet, Rfl: 3 .  Dulaglutide (TRULICITY) A999333 0000000 SOPN, Inject 0.75 mg into the skin once a week., Disp: 12 pen, Rfl: 3 .  famotidine (PEPCID) 10 MG tablet, Take 1 tablet (10 mg total) by mouth daily as needed for heartburn or indigestion., Disp: , Rfl:  .  fluticasone (FLONASE) 50 MCG/ACT nasal spray, SPRAY 2 SPRAYS INTO EACH NOSTRIL EVERY DAY, Disp: 48 mL, Rfl: 1 .  glucose blood test strip, Use as instructed, Disp: 100 each, Rfl: 2 .  isosorbide mononitrate (IMDUR) 30 MG 24 hr tablet, Take 0.5 tablets (15 mg total) by mouth daily., Disp: 90 tablet, Rfl: 3 .  Lancets (ONETOUCH ULTRASOFT) lancets, Use as instructed, Disp: 100 each, Rfl: 12 .  latanoprost (XALATAN) 0.005 % ophthalmic solution, Place 1 drop into both eyes at bedtime., Disp: 2.5 mL, Rfl: 0 .  LINZESS 290 MCG CAPS capsule, TAKE 1 CAPSULE BY MOUTH EVERY DAY BEFORE BREAKFAST, Disp: 90 capsule, Rfl: 1 .  montelukast (SINGULAIR) 10 MG tablet, Take 1 tablet (10 mg total) by mouth at bedtime as needed (allergies). 1 tablet daily, Disp: 90 tablet, Rfl: 3 .  nitroGLYCERIN (NITROSTAT) 0.4 MG SL  tablet, PLACE 1 TABLET (0.4 MG TOTAL) UNDER THE TONGUE EVERY 5 (FIVE) MINUTES AS NEEDED., Disp: 25 tablet, Rfl: 3 .  ONETOUCH DELICA LANCETS FINE MISC, TEST TWICE DAILY, Disp: 100 each, Rfl: 9 .  OXYGEN, Inhale 3-4 L into the lungs at bedtime., Disp: , Rfl:  .  polyethylene glycol powder (GLYCOLAX/MIRALAX) 17 GM/SCOOP powder, Take by mouth., Disp: , Rfl:  .  predniSONE (STERAPRED UNI-PAK 21 TAB) 10 MG (21) TBPK tablet, As directed x 6 days, Disp: 21 tablet, Rfl: 0 .  rosuvastatin (CRESTOR) 10 MG tablet, TAKE 1 TABLET (10 MG TOTAL) BY MOUTH EVERY EVENING. (PLEASE  MAKE 6 MOS APPT), Disp: 90 tablet, Rfl: 0 .  traMADol (ULTRAM) 50 MG tablet, Take 1 tablet (50 mg total) by mouth every 8 (eight) hours as needed., Disp: 60 tablet, Rfl: 2 .  traZODone (DESYREL) 50 MG tablet, Take 0.5 tablets (25 mg total) by mouth at bedtime as needed for sleep., Disp: 90 tablet, Rfl: 3 .  triamcinolone cream (KENALOG) 0.1 %, Apply topically 2 (two) times daily., Disp: 454 g, Rfl: 0 .  vitamin B-12 (CYANOCOBALAMIN) 1000 MCG tablet, Take 1,000 mcg by mouth daily., Disp: , Rfl:   Allergies  Allergen Reactions  . Azithromycin Other (See Comments)    Renal failure  . Prednisone Other (See Comments)    Irritability and insomnia  . Sulfamethoxazole-Trimethoprim Other (See Comments)    Renal failure  . Amlodipine Swelling    Caused swelling in legs  . Ciprofloxacin Itching and Rash  . Tape Rash   Past Medical History:  Diagnosis Date  . Arthritis    'all over"  . Asthma   . Chronic pain   . COPD (chronic obstructive pulmonary disease) (HCC)    O2 per [place] at nights   . Depression   . Diabetes mellitus without complication ([place])    borderline- , states she was on med., but MD told her "everything is under control so I threw the bottle away"  . Fibromyalgia   . GERD (gastroesophageal reflux disease)   . Hypertension   . Neuromuscular disorder (HCC)    parkinson, neuropathy- both feet & hands.  . 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  Assessment and Plan: 1. Suspected COVID-19 virus infection - Encouraged husband to take patient to get tested for COVID-19 so that she could potentially qualify for the infusion clinic with [facility].  Discussed symptom management.  Encouraged adequate hydration.  Discussed that if her shortness of breath worsens or she continues to not eat or drink very much she may need to go to the emergency room.  2. Cough - HYDROcodone-homatropine (HYCODAN) 5-1.5 MG/5ML syrup;  Take 5 mLs by mouth every 6 (six) hours as needed for cough.  Dispense: 120 mL; Refill: 0   Follow Up Instructions:  I discussed the assessment and treatment plan with the patient. The patient was provided an opportunity to ask questions and all were answered. The patient agreed with the plan and demonstrated an understanding of the instructions.   The patient was advised to call back or seek an in-person evaluation if the symptoms worsen or if the condition fails to improve as anticipated.  The above assessment and management plan was discussed with the patient. The patient verbalized understanding of and has agreed to the management plan. Patient is aware to call the clinic if symptoms persist or worsen. Patient is aware when to  return to the clinic for a follow-up visit. Patient educated on when it is appropriate to go to the emergency department.   Time call ended: 3:49 PM  I provided 12 minutes of non-face-to-face time during this encounter.  Hendricks Limes, MSN, APRN, FNP-C [facility] 05/22/19

## 2019-05-23 ENCOUNTER — Other Ambulatory Visit: Payer: Self-pay | Admitting: Cardiovascular Disease

## 2019-05-23 ENCOUNTER — Ambulatory Visit: Payer: Medicare HMO | Attending: Internal Medicine

## 2019-05-23 ENCOUNTER — Other Ambulatory Visit: Payer: Self-pay

## 2019-05-23 DIAGNOSIS — Z20822 Contact with and (suspected) exposure to covid-19: Secondary | ICD-10-CM | POA: Diagnosis not present

## 2019-05-24 ENCOUNTER — Other Ambulatory Visit: Payer: Self-pay | Admitting: Family Medicine

## 2019-05-25 ENCOUNTER — Other Ambulatory Visit: Payer: Self-pay | Admitting: Family Medicine

## 2019-05-25 ENCOUNTER — Encounter: Payer: Self-pay | Admitting: Family Medicine

## 2019-05-25 ENCOUNTER — Ambulatory Visit (INDEPENDENT_AMBULATORY_CARE_PROVIDER_SITE_OTHER): Payer: Medicare HMO | Admitting: Family Medicine

## 2019-05-25 DIAGNOSIS — M5030 Other cervical disc degeneration, unspecified cervical region: Secondary | ICD-10-CM | POA: Diagnosis not present

## 2019-05-25 LAB — NOVEL CORONAVIRUS, NAA: SARS-CoV-2, NAA: NOT DETECTED

## 2019-05-25 MED ORDER — TRAMADOL HCL 50 MG PO TABS: 50.0000 mg | ORAL_TABLET | Freq: Three times a day (TID) | ORAL | 2 refills | Status: DC | PRN

## 2019-05-25 NOTE — Progress Notes (Signed)
Virtual Visit via telephone Note  I connected with Susan Patel on 05/25/19 at 1559 by telephone and verified that I am speaking with the correct person using two identifiers. Susan Patel is currently located at home and no other people are currently with her during visit. The provider, Fransisca Kaufmann Ioane Bhola, MD is located in their office at time of visit.  Call ended at 1609  I discussed the limitations, risks, security and privacy concerns of performing an evaluation and management service by telephone and the availability of in person appointments. I also discussed with the patient that there may be a patient responsible charge related to this service. The patient expressed understanding and agreed to proceed.   History and Present Illness: Pain assessment: Cause of pain-degenerative disc disease Pain location-lower back Pain on scale of 1-10- 4 Frequency-Daily What increases pain-being in a seated position for prolonged periods What makes pain Better-tramadol Effects on ADL -limited because of history of stroke on ambulation anyways, does not do very much of her ADLs anyways. Any change in general medical condition-none  Current opioids rx-tramadol 50 mg every 8 hours as needed # meds rx-60 Effectiveness of current meds-working well Adverse reactions form pain meds-none Morphine equivalent- 10  Pill count performed-No Last drug screen -08/07/2018 ( high risk q3m, moderate risk q51m, low risk yearly ) Urine drug screen today- No Was the [place] reviewed-yes  If yes were their any concerning findings? -None  No flowsheet data found.   Pain contract signed on: 08/07/2018  No diagnosis found.  Outpatient Encounter Medications as of 05/25/2019  Medication Sig  . acetaminophen (TYLENOL) 325 MG tablet Take 2 tablets (650 mg total) by mouth every 6 (six) hours as needed. (Patient taking differently: Take 650 mg by mouth every 6 (six) hours as needed for moderate pain or headache.  )  . albuterol (PROVENTIL HFA;VENTOLIN HFA) 108 (90 Base) MCG/ACT inhaler Inhale 1-2 puffs into the lungs every 6 (six) hours as needed for wheezing or shortness of breath.  Marland Kitchen aspirin 81 MG EC tablet TAKE 1 TABLET (81 MG TOTAL) BY MOUTH DAILY FOR 90 DAYS.  Marland Kitchen BREO ELLIPTA 200-25 MCG/INH AEPB TAKE 1 PUFF BY MOUTH EVERY DAY (Patient taking differently: Inhale 1 puff into the lungs daily. )  . carvedilol (COREG) 12.5 MG tablet Take 1 tablet (12.5 mg total) by mouth 2 (two) times daily. (Needs to be seen before next refill)  . clopidogrel (PLAVIX) 75 MG tablet Take 1 tablet (75 mg total) by mouth daily.  . Dulaglutide (TRULICITY) A999333 0000000 SOPN Inject 0.75 mg into the skin once a week.  . famotidine (PEPCID) 10 MG tablet Take 1 tablet (10 mg total) by mouth daily as needed for heartburn or indigestion.  . fluticasone (FLONASE) 50 MCG/ACT nasal spray SPRAY 2 SPRAYS INTO EACH NOSTRIL EVERY DAY  . glucose blood test strip Use as instructed  . HYDROcodone-homatropine (HYCODAN) 5-1.5 MG/5ML syrup Take 5 mLs by mouth every 6 (six) hours as needed for cough.  . isosorbide mononitrate (IMDUR) 30 MG 24 hr tablet TAKE 1 TABLET BY MOUTH EVERY DAY  . Lancets (ONETOUCH ULTRASOFT) lancets Use as instructed  . latanoprost (XALATAN) 0.005 % ophthalmic solution Place 1 drop into both eyes at bedtime.  Marland Kitchen LINZESS 290 MCG CAPS capsule TAKE 1 CAPSULE BY MOUTH EVERY DAY BEFORE BREAKFAST  . montelukast (SINGULAIR) 10 MG tablet Take 1 tablet (10 mg total) by mouth at bedtime as needed (allergies). 1 tablet daily  . nitroGLYCERIN (  NITROSTAT) 0.4 MG SL tablet PLACE 1 TABLET (0.4 MG TOTAL) UNDER THE TONGUE EVERY 5 (FIVE) MINUTES AS NEEDED.  Marland Kitchen ONETOUCH DELICA LANCETS FINE MISC TEST TWICE DAILY  . OXYGEN Inhale 3-4 L into the lungs at bedtime.  . polyethylene glycol powder (GLYCOLAX/MIRALAX) 17 GM/SCOOP powder Take by mouth.  . predniSONE (STERAPRED UNI-PAK 21 TAB) 10 MG (21) TBPK tablet As directed x 6 days  . rosuvastatin  (CRESTOR) 10 MG tablet TAKE 1 TABLET (10 MG TOTAL) BY MOUTH EVERY EVENING. (PLEASE MAKE 6 MOS APPT)  . traMADol (ULTRAM) 50 MG tablet Take 1 tablet (50 mg total) by mouth every 8 (eight) hours as needed.  . traZODone (DESYREL) 50 MG tablet Take 0.5 tablets (25 mg total) by mouth at bedtime as needed for sleep.  Marland Kitchen triamcinolone cream (KENALOG) 0.1 % Apply topically 2 (two) times daily.  . vitamin B-12 (CYANOCOBALAMIN) 1000 MCG tablet Take 1,000 mcg by mouth daily.   No facility-administered encounter medications on file as of 05/25/2019.    Review of Systems  Constitutional: Negative for chills and fever.  Respiratory: Negative for chest tightness and shortness of breath.   Cardiovascular: Negative for chest pain and leg swelling.  Musculoskeletal: Positive for arthralgias and back pain. Negative for gait problem.  Skin: Negative for rash.  Neurological: Negative for light-headedness and headaches.  Psychiatric/Behavioral: Negative for agitation and behavioral problems.  All other systems reviewed and are negative.   Observations/Objective: Patient sounds comfortable and in no acute distress  Assessment and Plan: Problem List Items Addressed This Visit      Musculoskeletal and Integument   DDD (degenerative disc disease), cervical - Primary   Relevant Medications   traMADol (ULTRAM) 50 MG tablet    Filled for tramadol, follow-up in 3 months   Follow up plan: Return in about 3 months (around 08/23/2019), or if symptoms worsen or fail to improve, for back pain.     I discussed the assessment and treatment plan with the patient. The patient was provided an opportunity to ask questions and all were answered. The patient agreed with the plan and demonstrated an understanding of the instructions.   The patient was advised to call back or seek an in-person evaluation if the symptoms worsen or if the condition fails to improve as anticipated.  The above assessment and management plan was  discussed with the patient. The patient verbalized understanding of and has agreed to the management plan. Patient is aware to call the clinic if symptoms persist or worsen. Patient is aware when to return to the clinic for a follow-up visit. Patient educated on when it is appropriate to go to the emergency department.    I provided 10 minutes of non-face-to-face time during this encounter.    Worthy Rancher, MD

## 2019-05-28 ENCOUNTER — Telehealth: Payer: Self-pay | Admitting: Family Medicine

## 2019-05-28 NOTE — Telephone Encounter (Signed)
Pt aware covid lab test negative, not detected °

## 2019-06-04 ENCOUNTER — Encounter: Payer: Self-pay | Admitting: Family Medicine

## 2019-06-04 ENCOUNTER — Ambulatory Visit (INDEPENDENT_AMBULATORY_CARE_PROVIDER_SITE_OTHER): Payer: Medicare HMO | Admitting: Family Medicine

## 2019-06-04 DIAGNOSIS — R05 Cough: Secondary | ICD-10-CM

## 2019-06-04 DIAGNOSIS — J440 Chronic obstructive pulmonary disease with acute lower respiratory infection: Secondary | ICD-10-CM | POA: Diagnosis not present

## 2019-06-04 DIAGNOSIS — J209 Acute bronchitis, unspecified: Secondary | ICD-10-CM

## 2019-06-04 DIAGNOSIS — R059 Cough, unspecified: Secondary | ICD-10-CM

## 2019-06-04 MED ORDER — HYDROCODONE-HOMATROPINE 5-1.5 MG/5ML PO SYRP: 5.0000 mL | ORAL_SOLUTION | Freq: Four times a day (QID) | ORAL | 0 refills | Status: DC | PRN

## 2019-06-04 MED ORDER — PREDNISONE 20 MG PO TABS: ORAL_TABLET | ORAL | 0 refills | Status: DC

## 2019-06-04 MED ORDER — CEFDINIR 300 MG PO CAPS: 300.0000 mg | ORAL_CAPSULE | Freq: Two times a day (BID) | ORAL | 0 refills | Status: DC

## 2019-06-04 NOTE — Progress Notes (Signed)
Virtual Visit via telephone Note  I connected with Susan Patel on 06/04/19 at 1016 by telephone and verified that I am speaking with the correct person using two identifiers. JOURNII VEREEN is currently located at home and husband are currently with her during visit. The provider, Fransisca Kaufmann Skyleigh Windle, MD is located in their office at time of visit.  Call ended at 1025  I discussed the limitations, risks, security and privacy concerns of performing an evaluation and management service by telephone and the availability of in person appointments. I also discussed with the patient that there may be a patient responsible charge related to this service. The patient expressed understanding and agreed to proceed.   History and Present Illness: Patient is calling in for cough and chest congestion.  She denies any shortness of breath.  She tested negative for covid. She has been having congestion and wheezing and she is not using her albuterol.  Patient had tried Augmentin and then tried some prednisone and cough syrup and the prednisone cough syrup did help but she is not clearing it completely.  She is not having fevers and chills for at least over a week and she tested Covid negative although her husband tested positive but she is just not clearing it out.  She has not been using albuterol like she should.  1. Acute bronchitis with COPD ([place])   2. Cough     Outpatient Encounter Medications as of 06/04/2019  Medication Sig  . acetaminophen (TYLENOL) 325 MG tablet Take 2 tablets (650 mg total) by mouth every 6 (six) hours as needed. (Patient taking differently: Take 650 mg by mouth every 6 (six) hours as needed for moderate pain or headache. )  . albuterol (PROVENTIL HFA;VENTOLIN HFA) 108 (90 Base) MCG/ACT inhaler Inhale 1-2 puffs into the lungs every 6 (six) hours as needed for wheezing or shortness of breath.  Marland Kitchen aspirin 81 MG EC tablet TAKE 1 TABLET (81 MG TOTAL) BY MOUTH DAILY FOR 90 DAYS.  Marland Kitchen  BREO ELLIPTA 200-25 MCG/INH AEPB TAKE 1 PUFF BY MOUTH EVERY DAY (Patient taking differently: Inhale 1 puff into the lungs daily. )  . carvedilol (COREG) 12.5 MG tablet Take 1 tablet (12.5 mg total) by mouth 2 (two) times daily. (Needs to be seen before next refill)  . cefdinir (OMNICEF) 300 MG capsule Take 1 capsule (300 mg total) by mouth 2 (two) times daily. 1 po BID  . clopidogrel (PLAVIX) 75 MG tablet Take 1 tablet (75 mg total) by mouth daily.  . Dulaglutide (TRULICITY) A999333 0000000 SOPN Inject 0.75 mg into the skin once a week.  . famotidine (PEPCID) 10 MG tablet Take 1 tablet (10 mg total) by mouth daily as needed for heartburn or indigestion.  . fluticasone (FLONASE) 50 MCG/ACT nasal spray SPRAY 2 SPRAYS INTO EACH NOSTRIL EVERY DAY  . glucose blood test strip Use as instructed  . HYDROcodone-homatropine (HYCODAN) 5-1.5 MG/5ML syrup Take 5 mLs by mouth every 6 (six) hours as needed for cough.  . isosorbide mononitrate (IMDUR) 30 MG 24 hr tablet TAKE 1 TABLET BY MOUTH EVERY DAY  . Lancets (ONETOUCH ULTRASOFT) lancets Use as instructed  . latanoprost (XALATAN) 0.005 % ophthalmic solution Place 1 drop into both eyes at bedtime.  Marland Kitchen LINZESS 290 MCG CAPS capsule TAKE 1 CAPSULE BY MOUTH EVERY DAY BEFORE BREAKFAST  . montelukast (SINGULAIR) 10 MG tablet Take 1 tablet (10 mg total) by mouth at bedtime as needed (allergies). 1 tablet daily  .  nitroGLYCERIN (NITROSTAT) 0.4 MG SL tablet PLACE 1 TABLET (0.4 MG TOTAL) UNDER THE TONGUE EVERY 5 (FIVE) MINUTES AS NEEDED.  Marland Kitchen ONETOUCH DELICA LANCETS FINE MISC TEST TWICE DAILY  . OXYGEN Inhale 3-4 L into the lungs at bedtime.  . polyethylene glycol powder (GLYCOLAX/MIRALAX) 17 GM/SCOOP powder Take by mouth.  . predniSONE (DELTASONE) 20 MG tablet Take 3 tabs daily for 1 week, then 2 tabs daily for week 2, then 1 tab daily for week 3.  . rosuvastatin (CRESTOR) 10 MG tablet TAKE 1 TABLET (10 MG TOTAL) BY MOUTH EVERY EVENING. (PLEASE MAKE 6 MOS APPT)  . traMADol  (ULTRAM) 50 MG tablet Take 1 tablet (50 mg total) by mouth every 8 (eight) hours as needed.  . traZODone (DESYREL) 50 MG tablet Take 0.5 tablets (25 mg total) by mouth at bedtime as needed for sleep.  Marland Kitchen triamcinolone cream (KENALOG) 0.1 % Apply topically 2 (two) times daily.  . vitamin B-12 (CYANOCOBALAMIN) 1000 MCG tablet Take 1,000 mcg by mouth daily.  . [DISCONTINUED] HYDROcodone-homatropine (HYCODAN) 5-1.5 MG/5ML syrup Take 5 mLs by mouth every 6 (six) hours as needed for cough.  . [DISCONTINUED] predniSONE (STERAPRED UNI-PAK 21 TAB) 10 MG (21) TBPK tablet As directed x 6 days   No facility-administered encounter medications on file as of 06/04/2019.    Review of Systems  Constitutional: Negative for chills and fever.  HENT: Positive for congestion. Negative for ear discharge, ear pain, postnasal drip, rhinorrhea, sinus pressure, sneezing and sore throat.   Eyes: Negative for pain, redness and visual disturbance.  Respiratory: Positive for cough and wheezing. Negative for chest tightness and shortness of breath.   Cardiovascular: Negative for chest pain and leg swelling.  Genitourinary: Negative for difficulty urinating and dysuria.  Musculoskeletal: Negative for back pain and gait problem.  Skin: Negative for rash.  Neurological: Negative for light-headedness and headaches.  Psychiatric/Behavioral: Negative for agitation and behavioral problems.  All other systems reviewed and are negative.   Observations/Objective: Patient sounds comfortable and in no distress  Assessment and Plan: Problem List Items Addressed This Visit    None    Visit Diagnoses    Acute bronchitis with COPD ([place])    -  Primary   Relevant Medications   cefdinir (OMNICEF) 300 MG capsule   predniSONE (DELTASONE) 20 MG tablet   HYDROcodone-homatropine (HYCODAN) 5-1.5 MG/5ML syrup   Cough       Relevant Medications   cefdinir (OMNICEF) 300 MG capsule   predniSONE (DELTASONE) 20 MG tablet    HYDROcodone-homatropine (HYCODAN) 5-1.5 MG/5ML syrup      Will treat like possible bronchitis with Omnicef and prednisone and give her some Hycodan, if she is not improving from this or if she has to call back then we should probably go [facility] for a chest x-[facility]. Follow up plan: Return if symptoms worsen or fail to improve.     I discussed the assessment and treatment plan with the patient. The patient was provided an opportunity to ask questions and all were answered. The patient agreed with the plan and demonstrated an understanding of the instructions.   The patient was advised to call back or seek an in-person evaluation if the symptoms worsen or if the condition fails to improve as anticipated.  The above assessment and management plan was discussed with the patient. The patient verbalized understanding of and has agreed to the management plan. Patient is aware to call the clinic if symptoms persist or worsen. Patient is  aware when to return to the clinic for a follow-up visit. Patient educated on when it is appropriate to go to the emergency department.    I provided 9 minutes of non-face-to-face time during this encounter.    Worthy Rancher, MD

## 2019-06-08 ENCOUNTER — Other Ambulatory Visit: Payer: Self-pay | Admitting: Family Medicine

## 2019-06-08 DIAGNOSIS — R059 Cough, unspecified: Secondary | ICD-10-CM

## 2019-06-08 DIAGNOSIS — R05 Cough: Secondary | ICD-10-CM

## 2019-06-08 DIAGNOSIS — J440 Chronic obstructive pulmonary disease with acute lower respiratory infection: Secondary | ICD-10-CM

## 2019-06-08 DIAGNOSIS — J209 Acute bronchitis, unspecified: Secondary | ICD-10-CM

## 2019-06-08 MED ORDER — HYDROCODONE-HOMATROPINE 5-1.5 MG/5ML PO SYRP: 5.0000 mL | ORAL_SOLUTION | Freq: Four times a day (QID) | ORAL | 0 refills | Status: DC | PRN

## 2019-06-08 NOTE — Telephone Encounter (Signed)
Please advise LOV & RX 06/04/19

## 2019-06-08 NOTE — Telephone Encounter (Signed)
What is the name of the medication? Cough med hydrocodone  Have you contacted your pharmacy to request a refill? yes  Which pharmacy would you like this sent to? [facility]   Patient notified that their request is being sent to the clinical staff for review and that they should receive a call once it is complete. If they do not receive a call within 24 hours they can check with their pharmacy or our office.

## 2019-06-11 DIAGNOSIS — E119 Type 2 diabetes mellitus without complications: Secondary | ICD-10-CM | POA: Diagnosis not present

## 2019-06-11 DIAGNOSIS — I25119 Atherosclerotic heart disease of native coronary artery with unspecified angina pectoris: Secondary | ICD-10-CM | POA: Diagnosis not present

## 2019-06-11 DIAGNOSIS — E785 Hyperlipidemia, unspecified: Secondary | ICD-10-CM | POA: Diagnosis not present

## 2019-06-11 DIAGNOSIS — E669 Obesity, unspecified: Secondary | ICD-10-CM | POA: Diagnosis not present

## 2019-06-11 DIAGNOSIS — J449 Chronic obstructive pulmonary disease, unspecified: Secondary | ICD-10-CM | POA: Diagnosis not present

## 2019-06-11 DIAGNOSIS — G8220 Paraplegia, unspecified: Secondary | ICD-10-CM | POA: Diagnosis not present

## 2019-06-11 DIAGNOSIS — G4700 Insomnia, unspecified: Secondary | ICD-10-CM | POA: Diagnosis not present

## 2019-06-11 DIAGNOSIS — R69 Illness, unspecified: Secondary | ICD-10-CM | POA: Diagnosis not present

## 2019-06-11 DIAGNOSIS — I69365 Other paralytic syndrome following cerebral infarction, bilateral: Secondary | ICD-10-CM | POA: Diagnosis not present

## 2019-06-16 ENCOUNTER — Other Ambulatory Visit: Payer: Self-pay | Admitting: Family Medicine

## 2019-06-22 ENCOUNTER — Other Ambulatory Visit: Payer: Self-pay | Admitting: Family Medicine

## 2019-06-22 DIAGNOSIS — J440 Chronic obstructive pulmonary disease with acute lower respiratory infection: Secondary | ICD-10-CM

## 2019-06-22 DIAGNOSIS — J209 Acute bronchitis, unspecified: Secondary | ICD-10-CM

## 2019-06-22 DIAGNOSIS — L237 Allergic contact dermatitis due to plants, except food: Secondary | ICD-10-CM

## 2019-06-22 DIAGNOSIS — R05 Cough: Secondary | ICD-10-CM

## 2019-06-22 DIAGNOSIS — R059 Cough, unspecified: Secondary | ICD-10-CM

## 2019-07-11 ENCOUNTER — Other Ambulatory Visit: Payer: Self-pay | Admitting: Family Medicine

## 2019-07-11 NOTE — Telephone Encounter (Signed)
Appointment given for March 11th. Carvedilol given.

## 2019-07-20 ENCOUNTER — Ambulatory Visit (INDEPENDENT_AMBULATORY_CARE_PROVIDER_SITE_OTHER): Payer: Medicare HMO | Admitting: Family Medicine

## 2019-07-20 ENCOUNTER — Telehealth: Payer: Self-pay | Admitting: Family Medicine

## 2019-07-20 ENCOUNTER — Encounter: Payer: Self-pay | Admitting: Family Medicine

## 2019-07-20 DIAGNOSIS — J441 Chronic obstructive pulmonary disease with (acute) exacerbation: Secondary | ICD-10-CM | POA: Diagnosis not present

## 2019-07-20 MED ORDER — BENZONATATE 100 MG PO CAPS: 200.0000 mg | ORAL_CAPSULE | Freq: Three times a day (TID) | ORAL | 0 refills | Status: DC | PRN

## 2019-07-20 MED ORDER — DOXYCYCLINE HYCLATE 100 MG PO TABS: 100.0000 mg | ORAL_TABLET | Freq: Two times a day (BID) | ORAL | 0 refills | Status: AC

## 2019-07-20 MED ORDER — PREDNISONE 20 MG PO TABS: ORAL_TABLET | ORAL | 0 refills | Status: DC

## 2019-07-20 NOTE — Telephone Encounter (Signed)
Pts husband called back to let Dr Thayer Ohm know that pt got the 1st COVID Vaccine. Wants to know if that could be causing any reactions. Ok for pt to take medication that was sent in for her?

## 2019-07-20 NOTE — Progress Notes (Signed)
Virtual Visit via telephone Note Due to COVID-19 pandemic this visit was conducted virtually. This visit type was conducted due to national recommendations for restrictions regarding the COVID-19 Pandemic (e.g. social distancing, sheltering in place) in an effort to limit this patient's exposure and mitigate transmission in our community. All issues noted in this document were discussed and addressed.  A physical exam was not performed with this format.   I connected with Susan Patel and her husband Susan Patel on 07/20/2019 at 37 by telephone and verified that I am speaking with the correct person using two identifiers. Susan Patel is currently located at home and family is currently with them during visit. The provider, Monia Pouch, FNP is located in their office at time of visit.  I discussed the limitations, risks, security and privacy concerns of performing an evaluation and management service by telephone and the availability of in person appointments. I also discussed with the patient that there may be a patient responsible charge related to this service. The patient expressed understanding and agreed to proceed.  Subjective:  Patient ID: Susan Patel, female    DOB: March 19, 1941, 79 y.o.   MRN: [id number]  Chief Complaint:  Cough and Shortness of Breath   HPI: Susan Patel is a 79 y.o. female presenting on 07/20/2019 for Cough and Shortness of Breath   Husband reports worsening SHOB, cough, and wheezing over the last 3-4 days. States she has been using her breathing treatments as prescribed without resolution of symptoms.   Cough This is a recurrent problem. The problem has been gradually worsening. The problem occurs every few minutes. Associated symptoms include shortness of breath and wheezing. Pertinent negatives include no chest pain, chills, ear congestion, ear pain, fever, headaches, heartburn, hemoptysis, myalgias, nasal congestion, postnasal drip, rash, rhinorrhea, sore  throat, sweats or weight loss. Exacerbated by: any exertion. She has tried prescription cough suppressant, ipratropium inhaler, a beta-agonist inhaler and leukotriene antagonists for the symptoms. The treatment provided no relief. Her past medical history is significant for COPD.  Shortness of Breath This is a recurrent problem. The problem has been gradually worsening. Associated symptoms include wheezing. Pertinent negatives include no abdominal pain, chest pain, ear pain, fever, headaches, hemoptysis, leg swelling, rash, rhinorrhea, sore throat or vomiting. The symptoms are aggravated by any activity. Her past medical history is significant for COPD.     Relevant past medical, surgical, family, and social history reviewed and updated as indicated.  Allergies and medications reviewed and updated.   Past Medical History:  Diagnosis Date  . Arthritis    'all over"  . Asthma   . Chronic pain   . COPD (chronic obstructive pulmonary disease) (HCC)    O2 per [place] at nights   . Depression   . Diabetes mellitus without complication ([place])    borderline- , states she was on med., but MD told her "everything is under control so I threw the bottle away"  . Fibromyalgia   . GERD (gastroesophageal reflux disease)   . Hypertension   . Neuromuscular disorder (HCC)    parkinson, neuropathy- both feet & hands.  . 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 ABDOMINAL HYSTERECTOMY    . ANTERIOR CERVICAL DECOMP/DISCECTOMY FUSION N/A 07/10/2015   Procedure: Cervical five-six, Cervical six-seven anterior cervical decompression with fusion plating and bonegraft;  Surgeon: Jovita Gamma, MD;  Location: [place] [facility];  Service: Neurosurgery;  Laterality: N/A;  .  APPENDECTOMY    . BIOPSY EYE MUSCLE  03/20/2016   biopsy vessel to right eye due to swelling  . EYE SURGERY Bilateral    cataracts removed, /w "cyrstal lenses"   . LEFT HEART CATH AND CORONARY  ANGIOGRAPHY N/A 05/23/2018   Procedure: LEFT HEART CATH AND CORONARY ANGIOGRAPHY;  Surgeon: Troy Sine, MD;  Location: [place];  Service: Cardiovascular;  Laterality: N/A;  . SHOULDER ARTHROSCOPY Right    x2   RCR- spurs removed     Social History   Socioeconomic History  . Marital status: Married    Spouse name: wayne  . Number of children: 2  . Years of education: 16  . Highest education level: Bachelor's degree (e.g., BA, AB, BS)  Occupational History  . Occupation: retired    Comment: Automotive engineer  Tobacco Use  . Smoking status: Never Smoker  . Smokeless tobacco: Never Used  Substance and Sexual Activity  . Alcohol use: No  . Drug use: No  . Sexual activity: Yes  Other Topics Concern  . Not on file  Social History Narrative  . Not on file   Social Determinants of Health   Financial Resource Strain:   . Difficulty of Paying Living Expenses: Not on file  Food Insecurity:   . Worried About Charity fundraiser in the Last Year: Not on file  . Ran Out of Food in the Last Year: Not on file  Transportation Needs:   . Lack of Transportation (Medical): Not on file  . Lack of Transportation (Non-Medical): Not on file  Physical Activity:   . Days of Exercise per Week: Not on file  . Minutes of Exercise per Session: Not on file  Stress:   . Feeling of Stress : Not on file  Social Connections:   . Frequency of Communication with Friends and Family: Not on file  . Frequency of Social Gatherings with Friends and Family: Not on file  . Attends Religious Services: Not on file  . Active Member of Clubs or Organizations: Not on file  . Attends Archivist Meetings: Not on file  . Marital Status: Not on file  Intimate Partner Violence: Not At Risk  . Fear of Current or Ex-Partner: No  . Emotionally Abused: No  . Physically Abused: No  . Sexually Abused: No    Outpatient Encounter Medications as of 07/20/2019  Medication Sig  .  acetaminophen (TYLENOL) 325 MG tablet Take 2 tablets (650 mg total) by mouth every 6 (six) hours as needed. (Patient taking differently: Take 650 mg by mouth every 6 (six) hours as needed for moderate pain or headache. )  . albuterol (PROVENTIL HFA;VENTOLIN HFA) 108 (90 Base) MCG/ACT inhaler Inhale 1-2 puffs into the lungs every 6 (six) hours as needed for wheezing or shortness of breath.  Marland Kitchen aspirin 81 MG EC tablet TAKE 1 TABLET (81 MG TOTAL) BY MOUTH DAILY FOR 90 DAYS.  Marland Kitchen benzonatate (TESSALON PERLES) 100 MG capsule Take 2 capsules (200 mg total) by mouth 3 (three) times daily as needed for cough.  Marland Kitchen BREO ELLIPTA 200-25 MCG/INH AEPB TAKE 1 PUFF BY MOUTH EVERY DAY (Patient taking differently: Inhale 1 puff into the lungs daily. )  . carvedilol (COREG) 12.5 MG tablet TAKE 1 TABLET (12.5 MG TOTAL) BY MOUTH 2 (TWO) TIMES DAILY. (NEEDS TO BE SEEN BEFORE NEXT REFILL)  . cefdinir (OMNICEF) 300 MG capsule Take 1 capsule (300 mg total) by mouth 2 (two) times daily.  1 po BID  . clopidogrel (PLAVIX) 75 MG tablet Take 1 tablet (75 mg total) by mouth daily.  Marland Kitchen doxycycline (VIBRA-TABS) 100 MG tablet Take 1 tablet (100 mg total) by mouth 2 (two) times daily for 10 days. 1 po bid  . Dulaglutide (TRULICITY) A999333 0000000 SOPN Inject 0.75 mg into the skin once a week.  . famotidine (PEPCID) 10 MG tablet Take 1 tablet (10 mg total) by mouth daily as needed for heartburn or indigestion.  . fluticasone (FLONASE) 50 MCG/ACT nasal spray SPRAY 2 SPRAYS INTO EACH NOSTRIL EVERY DAY  . glucose blood test strip Use as instructed  . HYDROcodone-homatropine (HYCODAN) 5-1.5 MG/5ML syrup Take 5 mLs by mouth every 6 (six) hours as needed for cough.  . isosorbide mononitrate (IMDUR) 30 MG 24 hr tablet TAKE 1 TABLET BY MOUTH EVERY DAY  . Lancets (ONETOUCH ULTRASOFT) lancets Use as instructed  . latanoprost (XALATAN) 0.005 % ophthalmic solution Place 1 drop into both eyes at bedtime.  Marland Kitchen LINZESS 290 MCG CAPS capsule TAKE 1 CAPSULE BY  MOUTH EVERY DAY BEFORE BREAKFAST  . montelukast (SINGULAIR) 10 MG tablet Take 1 tablet (10 mg total) by mouth at bedtime as needed (allergies). 1 tablet daily  . nitroGLYCERIN (NITROSTAT) 0.4 MG SL tablet PLACE 1 TABLET (0.4 MG TOTAL) UNDER THE TONGUE EVERY 5 (FIVE) MINUTES AS NEEDED.  Marland Kitchen ONETOUCH DELICA LANCETS FINE MISC TEST TWICE DAILY  . OXYGEN Inhale 3-4 L into the lungs at bedtime.  . polyethylene glycol powder (GLYCOLAX/MIRALAX) 17 GM/SCOOP powder Take by mouth.  . predniSONE (DELTASONE) 20 MG tablet 2 po at sametime daily for 5 days  . rosuvastatin (CRESTOR) 10 MG tablet TAKE 1 TABLET (10 MG TOTAL) BY MOUTH EVERY EVENING. (PLEASE MAKE 6 MOS APPT)  . traMADol (ULTRAM) 50 MG tablet Take 1 tablet (50 mg total) by mouth every 8 (eight) hours as needed.  . traZODone (DESYREL) 50 MG tablet Take 0.5 tablets (25 mg total) by mouth at bedtime as needed for sleep.  Marland Kitchen triamcinolone cream (KENALOG) 0.1 % APPLY TO AFFECTED AREA TWICE A DAY  . vitamin B-12 (CYANOCOBALAMIN) 1000 MCG tablet Take 1,000 mcg by mouth daily.  . [DISCONTINUED] predniSONE (DELTASONE) 20 MG tablet Take 3 tabs daily for 1 week, then 2 tabs daily for week 2, then 1 tab daily for week 3.   No facility-administered encounter medications on file as of 07/20/2019.    Allergies  Allergen Reactions  . Azithromycin Other (See Comments)    Renal failure  . Prednisone Other (See Comments)    Irritability and insomnia  . Sulfamethoxazole-Trimethoprim Other (See Comments)    Renal failure  . Amlodipine Swelling    Caused swelling in legs  . Ciprofloxacin Itching and Rash  . Tape Rash    Review of Systems  Constitutional: Positive for activity change and fatigue. Negative for appetite change, chills, diaphoresis, fever, unexpected weight change and weight loss.  HENT: Negative.  Negative for ear pain, postnasal drip, rhinorrhea and sore throat.   Eyes: Negative.  Negative for photophobia and visual disturbance.  Respiratory:  Positive for cough, shortness of breath and wheezing. Negative for hemoptysis and chest tightness.   Cardiovascular: Negative for chest pain, palpitations and leg swelling.  Gastrointestinal: Negative for abdominal pain, blood in stool, constipation, diarrhea, heartburn, nausea and vomiting.  Endocrine: Negative.   Genitourinary: Negative for decreased urine volume, difficulty urinating, dysuria, frequency and urgency.  Musculoskeletal: Negative for arthralgias and myalgias.  Skin: Negative.  Negative for rash.  Allergic/Immunologic: Negative.   Neurological: Negative for dizziness, tremors, seizures, syncope, facial asymmetry, speech difficulty, weakness, light-headedness, numbness and headaches.  Hematological: Negative.   Psychiatric/Behavioral: Negative for confusion, hallucinations, sleep disturbance and suicidal ideas.  All other systems reviewed and are negative.        Observations/Objective: No vital signs or physical exam, this was a telephone or virtual health encounter.  Pt alert and oriented, answers all questions appropriately, and able to speak in full sentences.    Assessment and Plan: Susan Patel was seen today for cough and shortness of breath.  Diagnoses and all orders for this visit:  COPD with acute exacerbation ([place]) Reported symptoms consistent with acute COPD exacerbation. Will initiate below along with continued breathing treatments at home. Spouse aware to report any new, worsening, or persistent symptoms. Follow up in 2 weeks for reevaluation.  -     predniSONE (DELTASONE) 20 MG tablet; 2 po at sametime daily for 5 days -     benzonatate (TESSALON PERLES) 100 MG capsule; Take 2 capsules (200 mg total) by mouth 3 (three) times daily as needed for cough. -     doxycycline (VIBRA-TABS) 100 MG tablet; Take 1 tablet (100 mg total) by mouth 2 (two) times daily for 10 days. 1 po bid     Follow Up Instructions: Return in about 2 weeks (around 08/03/2019), or if  symptoms worsen or fail to improve.    I discussed the assessment and treatment plan with the patient. The patient was provided an opportunity to ask questions and all were answered. The patient agreed with the plan and demonstrated an understanding of the instructions.   The patient was advised to call back or seek an in-person evaluation if the symptoms worsen or if the condition fails to improve as anticipated.  The above assessment and management plan was discussed with the patient. The patient verbalized understanding of and has agreed to the management plan. Patient is aware to call the clinic if they develop any new symptoms or if symptoms persist or worsen. Patient is aware when to return to the clinic for a follow-up visit. Patient educated on when it is appropriate to go to the emergency department.    I provided 15 minutes of non-face-to-face time during this encounter. The call started at 1310. The call ended at 1325. The other time was used for coordination of care.    Monia Pouch, FNP-C [facility] [address] [place],  [address] [phone] 07/20/2019

## 2019-07-25 ENCOUNTER — Other Ambulatory Visit: Payer: Self-pay

## 2019-07-26 ENCOUNTER — Encounter: Payer: Self-pay | Admitting: Family Medicine

## 2019-07-26 ENCOUNTER — Ambulatory Visit (INDEPENDENT_AMBULATORY_CARE_PROVIDER_SITE_OTHER): Payer: Medicare HMO | Admitting: Family Medicine

## 2019-07-26 VITALS — BP 161/90 | HR 79 | Ht 62.0 in | Wt 184.0 lb

## 2019-07-26 DIAGNOSIS — K2100 Gastro-esophageal reflux disease with esophagitis, without bleeding: Secondary | ICD-10-CM

## 2019-07-26 DIAGNOSIS — I10 Essential (primary) hypertension: Secondary | ICD-10-CM | POA: Diagnosis not present

## 2019-07-26 DIAGNOSIS — M5030 Other cervical disc degeneration, unspecified cervical region: Secondary | ICD-10-CM

## 2019-07-26 DIAGNOSIS — E782 Mixed hyperlipidemia: Secondary | ICD-10-CM

## 2019-07-26 DIAGNOSIS — E1142 Type 2 diabetes mellitus with diabetic polyneuropathy: Secondary | ICD-10-CM | POA: Diagnosis not present

## 2019-07-26 LAB — BAYER DCA HB A1C WAIVED: HB A1C (BAYER DCA - WAIVED): 9.1 % — ABNORMAL HIGH (ref <7.0)

## 2019-07-26 MED ORDER — BD PEN NEEDLE MINI U/F 31G X 5 MM MISC: 1.0000 | Freq: Every day | 3 refills | Status: DC

## 2019-07-26 MED ORDER — TRESIBA FLEXTOUCH 100 UNIT/ML ~~LOC~~ SOPN: 10.0000 [IU] | PEN_INJECTOR | Freq: Every day | SUBCUTANEOUS | 3 refills | Status: DC

## 2019-07-26 MED ORDER — TRAMADOL HCL 50 MG PO TABS: 50.0000 mg | ORAL_TABLET | Freq: Three times a day (TID) | ORAL | 2 refills | Status: DC | PRN

## 2019-07-26 NOTE — Progress Notes (Signed)
BP (!) 161/90   Pulse 79   Ht 5\' 2"  (1.575 m)   Wt 184 lb (83.5 kg)   SpO2 97%   BMI 33.65 kg/m    Subjective:   Patient ID: Susan Patel, female    DOB: [birth year], 79 y.o.   MRN: [id number]  HPI: Susan Patel is a 79 y.o. female presenting on 07/26/2019 for Medical Management of Chronic Issues and Diabetes   HPI Type 2 diabetes mellitus Patient comes in today for recheck of his diabetes. Patient has been currently taking Trulicity. Patient is not currently on an ACE inhibitor/ARB. Patient has not seen an ophthalmologist this year. Patient denies any issues with their feet.   Hypertension Patient is currently on carvedilol and Imdur, and their blood pressure today is 161/90. Patient denies any lightheadedness or dizziness. Patient denies headaches, blurred vision, chest pains, shortness of breath, or weakness. Denies any side effects from medication and is content with current medication.   Hyperlipidemia Patient is coming in for recheck of his hyperlipidemia. The patient is currently taking Crestor. They deny any issues with myalgias or history of liver damage from it. They deny any focal numbness or weakness or chest pain.   GERD Patient is currently on famotidine.  She denies any major symptoms or abdominal pain or belching or burping. She denies any blood in her stool or lightheadedness or dizziness.   Pain assessment: Cause of pain-degenerative disc disease Pain location-lower back Pain on scale of 1-10-5 Frequency-Daily What increases pain-chronic sitting What makes pain Better-tramadol Effects on ADL -patient has made effects from cervical vascular disease affecting her memory and function significantly. Any change in general medical condition-stable  Current opioids rx-tramadol 50 mg every 8 hours as needed # meds rx-60 Effectiveness of current meds-works well Adverse reactions form pain meds-none Morphine equivalent-35  Pill count performed-No Last drug  screen -08/07/2018 ( high risk q62m, moderate risk q69m, low risk yearly ) Urine drug screen today- Yes Was the [place] reviewed-yes  If yes were their any concerning findings? -None   Pain contract signed on: Today  Relevant past medical, surgical, family and social history reviewed and updated as indicated. Interim medical history since our last visit reviewed. Allergies and medications reviewed and updated.  Review of Systems  Constitutional: Negative for chills and fever.  Eyes: Negative for visual disturbance.  Respiratory: Negative for chest tightness and shortness of breath.   Cardiovascular: Negative for chest pain and leg swelling.  Musculoskeletal: Positive for arthralgias and back pain. Negative for gait problem.  Skin: Negative for rash.  Neurological: Negative for light-headedness and headaches.  Psychiatric/Behavioral: Negative for agitation and behavioral problems.  All other systems reviewed and are negative.   Per HPI unless specifically indicated above   Allergies as of 07/26/2019      Reactions   Azithromycin Other (See Comments)   Renal failure   Prednisone Other (See Comments)   Irritability and insomnia   Sulfamethoxazole-trimethoprim Other (See Comments)   Renal failure   Amlodipine Swelling   Caused swelling in legs   Ciprofloxacin Itching, Rash   Tape Rash      Medication List       Accurate as of July 26, 2019 12:27 PM. If you have any questions, ask your nurse or doctor.        STOP taking these medications   benzonatate 100 MG capsule Commonly known as: Best boy Stopped by: Worthy Rancher, MD   predniSONE 20 MG tablet  Commonly known as: Deltasone Stopped by: Worthy Rancher, MD     TAKE these medications   acetaminophen 325 MG tablet Commonly known as: Tylenol Take 2 tablets (650 mg total) by mouth every 6 (six) hours as needed. What changed: reasons to take this   albuterol 108 (90 Base) MCG/ACT inhaler Commonly  known as: VENTOLIN HFA Inhale 1-2 puffs into the lungs every 6 (six) hours as needed for wheezing or shortness of breath.   aspirin 81 MG EC tablet TAKE 1 TABLET (81 MG TOTAL) BY MOUTH DAILY FOR 90 DAYS.   B-D UF III MINI PEN NEEDLES 31G X 5 MM Misc Generic drug: Insulin Pen Needle 1 each by Does not apply route daily. Started by: Worthy Rancher, MD   [name] [id number] MCG/INH Aepb Generic drug: fluticasone furoate-vilanterol TAKE 1 PUFF BY MOUTH EVERY DAY What changed: See the new instructions.   carvedilol 12.5 MG tablet Commonly known as: COREG TAKE 1 TABLET (12.5 MG TOTAL) BY MOUTH 2 (TWO) TIMES DAILY. (NEEDS TO BE SEEN BEFORE NEXT REFILL)   cefdinir 300 MG capsule Commonly known as: OMNICEF Take 1 capsule (300 mg total) by mouth 2 (two) times daily. 1 po BID   clopidogrel 75 MG tablet Commonly known as: PLAVIX Take 1 tablet (75 mg total) by mouth daily.   doxycycline 100 MG tablet Commonly known as: VIBRA-TABS Take 1 tablet (100 mg total) by mouth 2 (two) times daily for 10 days. 1 po bid   Dulaglutide 0.75 MG/0.5ML Sopn Commonly known as: Trulicity Inject [id number] mg into the skin once a week.   famotidine 10 MG tablet Commonly known as: PEPCID Take 1 tablet (10 mg total) by mouth daily as needed for heartburn or indigestion.   fluticasone 50 MCG/ACT nasal spray Commonly known as: FLONASE SPRAY 2 SPRAYS INTO EACH NOSTRIL EVERY DAY   glucose blood test strip Use as instructed   HYDROcodone-homatropine 5-1.5 MG/5ML syrup Commonly known as: HYCODAN Take 5 mLs by mouth every 6 (six) hours as needed for cough.   isosorbide mononitrate 30 MG 24 hr tablet Commonly known as: IMDUR TAKE 1 TABLET BY MOUTH EVERY DAY   latanoprost 0.005 % ophthalmic solution Commonly known as: XALATAN Place 1 drop into both eyes at bedtime.   Linzess 290 MCG Caps capsule Generic drug: linaclotide TAKE 1 CAPSULE BY MOUTH EVERY DAY BEFORE BREAKFAST   montelukast 10 MG  tablet Commonly known as: SINGULAIR Take 1 tablet (10 mg total) by mouth at bedtime as needed (allergies). 1 tablet daily   nitroGLYCERIN 0.4 MG SL tablet Commonly known as: NITROSTAT PLACE 1 TABLET (0.4 MG TOTAL) UNDER THE TONGUE EVERY 5 (FIVE) MINUTES AS NEEDED.   onetouch ultrasoft lancets Use as instructed   OneTouch Delica Lancets Fine Misc TEST TWICE DAILY   OXYGEN Inhale 3-4 L into the lungs at bedtime.   polyethylene glycol powder 17 GM/SCOOP powder Commonly known as: GLYCOLAX/MIRALAX Take by mouth.   rosuvastatin 10 MG tablet Commonly known as: CRESTOR TAKE 1 TABLET (10 MG TOTAL) BY MOUTH EVERY EVENING. (PLEASE MAKE 6 MOS APPT)   traMADol 50 MG tablet Commonly known as: ULTRAM Take 1 tablet (50 mg total) by mouth every 8 (eight) hours as needed.   traZODone 50 MG tablet Commonly known as: DESYREL Take 0.5 tablets (25 mg total) by mouth at bedtime as needed for sleep.   Tyler Aas FlexTouch 100 UNIT/ML FlexTouch Pen Generic drug: insulin degludec Inject 0.1 mLs (10 Units total) into the skin daily.  Started by: Fransisca Kaufmann Tova Vater, MD   triamcinolone cream 0.1 % Commonly known as: KENALOG APPLY TO AFFECTED AREA TWICE A DAY   vitamin B-12 1000 MCG tablet Commonly known as: CYANOCOBALAMIN Take 1,000 mcg by mouth daily.        Objective:   BP (!) 161/90   Pulse 79   Ht 5\' 2"  (1.575 m)   Wt 184 lb (83.5 kg)   SpO2 97%   BMI 33.65 kg/m   Wt Readings from Last 3 Encounters:  07/26/19 184 lb (83.5 kg)  11/13/18 186 lb (84.4 kg)  08/20/18 199 lb 15.3 oz (90.7 kg)    Physical Exam Vitals and nursing note reviewed.  Constitutional:      General: She is not in acute distress.    Appearance: She is well-developed. She is not diaphoretic.     Comments: Essentially wheelchair-bound, only partially verbal, husband gives most of the history  Eyes:     Conjunctiva/sclera: Conjunctivae normal.  Cardiovascular:     Rate and Rhythm: Normal rate and regular  rhythm.     Heart sounds: Normal heart sounds. No murmur.  Pulmonary:     Effort: Pulmonary effort is normal. No respiratory distress.     Breath sounds: Normal breath sounds. No wheezing.  Musculoskeletal:        General: No tenderness. Normal range of motion.  Skin:    General: Skin is warm and dry.     Findings: No rash.  Neurological:     Mental Status: She is alert and oriented to person, place, and time.     Coordination: Coordination normal.  Psychiatric:        Behavior: Behavior normal.       Assessment & Plan:   Problem List Items Addressed This Visit      Cardiovascular and Mediastinum   HTN (hypertension)     Digestive   Gastro-esophageal reflux disease with esophagitis     Endocrine   Type 2 diabetes mellitus with peripheral neuropathy (HCC) - Primary   Relevant Medications   insulin degludec (TRESIBA FLEXTOUCH) 100 UNIT/ML FlexTouch Pen   Other Relevant Orders   Bayer DCA Hb A1c Waived (Completed)     Musculoskeletal and Integument   DDD (degenerative disc disease), cervical   Relevant Medications   traMADol (ULTRAM) 50 MG tablet     Other   Mixed hyperlipidemia      Patient's diabetes is still struggling out of control, will give Tresiba, A1c is 9.1.  Continue tramadol dose, no other change medication. Follow up plan: Return in about 3 months (around 10/26/2019), or if symptoms worsen or fail to improve, for dm and htn.  Counseling provided for all of the vaccine components Orders Placed This Encounter  Procedures  . B[facility] Hb A1c [place], MD [facility] 07/26/2019, 12:27 PM

## 2019-08-09 ENCOUNTER — Other Ambulatory Visit: Payer: Self-pay | Admitting: Family Medicine

## 2019-08-11 ENCOUNTER — Other Ambulatory Visit: Payer: Self-pay | Admitting: Family Medicine

## 2019-08-13 ENCOUNTER — Other Ambulatory Visit: Payer: Self-pay | Admitting: Family Medicine

## 2019-08-19 ENCOUNTER — Other Ambulatory Visit: Payer: Self-pay | Admitting: Family Medicine

## 2019-08-19 DIAGNOSIS — G4700 Insomnia, unspecified: Secondary | ICD-10-CM

## 2019-08-21 ENCOUNTER — Other Ambulatory Visit: Payer: Self-pay | Admitting: Family Medicine

## 2019-09-14 ENCOUNTER — Other Ambulatory Visit: Payer: Self-pay | Admitting: Family Medicine

## 2019-09-17 DIAGNOSIS — M542 Cervicalgia: Secondary | ICD-10-CM | POA: Diagnosis not present

## 2019-09-20 ENCOUNTER — Other Ambulatory Visit: Payer: Self-pay | Admitting: Family Medicine

## 2019-09-20 DIAGNOSIS — J3089 Other allergic rhinitis: Secondary | ICD-10-CM

## 2019-09-24 ENCOUNTER — Other Ambulatory Visit: Payer: Self-pay | Admitting: Physician Assistant

## 2019-09-24 DIAGNOSIS — M542 Cervicalgia: Secondary | ICD-10-CM

## 2019-09-30 ENCOUNTER — Other Ambulatory Visit: Payer: Self-pay | Admitting: Family Medicine

## 2019-09-30 DIAGNOSIS — K5900 Constipation, unspecified: Secondary | ICD-10-CM

## 2019-10-16 ENCOUNTER — Other Ambulatory Visit: Payer: Self-pay

## 2019-10-16 ENCOUNTER — Ambulatory Visit (HOSPITAL_COMMUNITY)
Admission: RE | Admit: 2019-10-16 | Discharge: 2019-10-16 | Disposition: A | Discharge status: Home / Self Care | Payer: Medicare HMO | Source: Ambulatory Visit | Attending: Physician Assistant | Admitting: Physician Assistant

## 2019-10-16 DIAGNOSIS — M542 Cervicalgia: Secondary | ICD-10-CM | POA: Insufficient documentation

## 2019-10-26 ENCOUNTER — Encounter: Payer: Self-pay | Admitting: Family Medicine

## 2019-10-26 ENCOUNTER — Ambulatory Visit (INDEPENDENT_AMBULATORY_CARE_PROVIDER_SITE_OTHER): Payer: Medicare HMO | Admitting: Family Medicine

## 2019-10-26 DIAGNOSIS — J988 Other specified respiratory disorders: Secondary | ICD-10-CM

## 2019-10-26 DIAGNOSIS — B9689 Other specified bacterial agents as the cause of diseases classified elsewhere: Secondary | ICD-10-CM | POA: Diagnosis not present

## 2019-10-26 MED ORDER — METHYLPREDNISOLONE 4 MG PO TBPK: ORAL_TABLET | ORAL | 0 refills | Status: DC

## 2019-10-26 MED ORDER — AMOXICILLIN-POT CLAVULANATE 875-125 MG PO TABS: 1.0000 | ORAL_TABLET | Freq: Two times a day (BID) | ORAL | 0 refills | Status: DC

## 2019-10-26 NOTE — Progress Notes (Signed)
Virtual Visit via Telephone Note  I connected with Susan Patel on 10/26/19 at 4:49 PM by telephone and verified that I am speaking with the correct person using two identifiers. Susan Patel is currently located at home and her husband is currently with her during this visit. The provider, Loman Brooklyn, FNP is located in their office at time of visit.  I discussed the limitations, risks, security and privacy concerns of performing an evaluation and management service by telephone and the availability of in person appointments. I also discussed with the patient that there may be a patient responsible charge related to this service. The patient expressed understanding and agreed to proceed.  Subjective: PCP: Dettinger, Fransisca Kaufmann, MD  Chief Complaint  Patient presents with  . Bronchitis   Patient complains of a dry cough, head/chest congestion, runny nose, sneezing, ear pain/pressure, shortness of breath and wheezing. Onset of symptoms was greater than 1 week ago, unchanged since that time. She is drinking plenty of fluids. Evaluation to date: none. Treatment to date: cough suppressants, nasal steroids and inhalers. She has a history of COPD and asthma. She does not smoke.    ROS: Per HPI  Current Outpatient Medications:  .  acetaminophen (TYLENOL) 325 MG tablet, Take 2 tablets (650 mg total) by mouth every 6 (six) hours as needed. (Patient taking differently: Take 650 mg by mouth every 6 (six) hours as needed for moderate pain or headache. ), Disp: 30 tablet, Rfl: 0 .  albuterol (PROVENTIL HFA;VENTOLIN HFA) 108 (90 Base) MCG/ACT inhaler, Inhale 1-2 puffs into the lungs every 6 (six) hours as needed for wheezing or shortness of breath., Disp: 1 Inhaler, Rfl: 1 .  aspirin 81 MG EC tablet, TAKE 1 TABLET (81 MG TOTAL) BY MOUTH DAILY FOR 90 DAYS., Disp: 90 tablet, Rfl: 1 .  BREO ELLIPTA 200-25 MCG/INH AEPB, INHALE 1 PUFF BY MOUTH EVERY DAY, Disp: 60 each, Rfl: 4 .  carvedilol (COREG) 12.5  MG tablet, Take 1 tablet (12.5 mg total) by mouth 2 (two) times daily. (Needs to be seen before next refill), Disp: 60 tablet, Rfl: 2 .  clopidogrel (PLAVIX) 75 MG tablet, Take 1 tablet (75 mg total) by mouth daily., Disp: 90 tablet, Rfl: 3 .  famotidine (PEPCID) 10 MG tablet, Take 1 tablet (10 mg total) by mouth daily as needed for heartburn or indigestion., Disp: , Rfl:  .  fluticasone (FLONASE) 50 MCG/ACT nasal spray, SPRAY 2 SPRAYS INTO EACH NOSTRIL EVERY DAY, Disp: 48 mL, Rfl: 1 .  glucose blood test strip, Use as instructed, Disp: 100 each, Rfl: 2 .  HYDROcodone-homatropine (HYCODAN) 5-1.5 MG/5ML syrup, Take 5 mLs by mouth every 6 (six) hours as needed for cough., Disp: 120 mL, Rfl: 0 .  insulin degludec (TRESIBA FLEXTOUCH) 100 UNIT/ML FlexTouch Pen, Inject 0.1 mLs (10 Units total) into the skin daily., Disp: 15 mL, Rfl: 3 .  Insulin Pen Needle (B-D UF III MINI PEN NEEDLES) 31G X 5 MM MISC, 1 each by Does not apply route daily., Disp: 90 each, Rfl: 3 .  isosorbide mononitrate (IMDUR) 30 MG 24 hr tablet, TAKE 1 TABLET BY MOUTH EVERY DAY, Disp: 90 tablet, Rfl: 3 .  Lancets (ONETOUCH ULTRASOFT) lancets, Use as instructed, Disp: 100 each, Rfl: 12 .  latanoprost (XALATAN) 0.005 % ophthalmic solution, Place 1 drop into both eyes at bedtime., Disp: 2.5 mL, Rfl: 0 .  LINZESS 290 MCG CAPS capsule, TAKE 1 CAPSULE BY MOUTH EVERY DAY BEFORE BREAKFAST, Disp:  90 capsule, Rfl: 1 .  montelukast (SINGULAIR) 10 MG tablet, TAKE 1 TABLET BY MOUTH EVERY DAY, Disp: 90 tablet, Rfl: 3 .  nitroGLYCERIN (NITROSTAT) 0.4 MG SL tablet, PLACE 1 TABLET (0.4 MG TOTAL) UNDER THE TONGUE EVERY 5 (FIVE) MINUTES AS NEEDED., Disp: 25 tablet, Rfl: 3 .  ONETOUCH DELICA LANCETS FINE MISC, TEST TWICE DAILY, Disp: 100 each, Rfl: 9 .  OXYGEN, Inhale 3-4 L into the lungs at bedtime., Disp: , Rfl:  .  polyethylene glycol powder (GLYCOLAX/MIRALAX) 17 GM/SCOOP powder, Take by mouth., Disp: , Rfl:  .  rosuvastatin (CRESTOR) 10 MG tablet,  Take 1 tablet (10 mg total) by mouth every evening., Disp: 90 tablet, Rfl: 1 .  traMADol (ULTRAM) 50 MG tablet, Take 1 tablet (50 mg total) by mouth every 8 (eight) hours as needed., Disp: 60 tablet, Rfl: 2 .  traZODone (DESYREL) 50 MG tablet, TAKE 1 TABLET BY MOUTH EVERYDAY AT BEDTIME, Disp: 90 tablet, Rfl: 3 .  triamcinolone cream (KENALOG) 0.1 %, APPLY TO AFFECTED AREA TWICE A DAY, Disp: 454 g, Rfl: 0 .  TRULICITY 8.24 MP/5.3IR SOPN, INJECT 0.75 MG INTO THE SKIN ONCE A WEEK., Disp: 6 mL, Rfl: 0 .  vitamin B-12 (CYANOCOBALAMIN) 1000 MCG tablet, Take 1,000 mcg by mouth daily., Disp: , Rfl:   Allergies  Allergen Reactions  . Azithromycin Other (See Comments)    Renal failure  . Prednisone Other (See Comments)    Irritability and insomnia  . Sulfamethoxazole-Trimethoprim Other (See Comments)    Renal failure  . Amlodipine Swelling    Caused swelling in legs  . Ciprofloxacin Itching and Rash  . Tape Rash   Past Medical History:  Diagnosis Date  . Arthritis    'all over"  . Asthma   . Chronic pain   . COPD (chronic obstructive pulmonary disease) (HCC)    O2 per [place] at nights   . Depression   . Diabetes mellitus without complication ([place])    borderline- , states she was on med., but MD told her "everything is under control so I threw the bottle away"  . Fibromyalgia   . GERD (gastroesophageal reflux disease)   . Hypertension   . Neuromuscular disorder (HCC)    parkinson, neuropathy- both feet & hands.  . 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 Bacterial respiratory infection - amoxicillin-clavulanate (AUGMENTIN) 875-125 MG tablet; Take 1 tablet by mouth 2 (two) times daily.  Dispense: 20 tablet; Refill: 0 - methylPREDNISolone (MEDROL DOSEPAK) 4 MG TBPK tablet; Use as directed on package.  Dispense: 21 each; Refill:  0   Follow Up Instructions:  I discussed the assessment and treatment plan with the patient. The patient was provided an opportunity to ask questions and all were answered. The patient agreed with the plan and demonstrated an understanding of the instructions.   The patient was advised to call back or seek an in-person evaluation if the symptoms worsen or if the condition fails to improve as anticipated.  The above assessment and management plan was discussed with the patient. The patient verbalized understanding of and has agreed to the management plan. Patient is aware to call the clinic if symptoms persist or worsen. Patient is aware when to return to the clinic for a follow-up visit. Patient educated on when it is appropriate to go to the emergency department.  Time call ended: 4:56 PM  I provided 9 minutes of non-face-to-face time during this encounter.  Hendricks Limes, MSN, APRN, FNP-C [facility] 10/26/19

## 2019-11-02 ENCOUNTER — Other Ambulatory Visit: Payer: Self-pay | Admitting: Family Medicine

## 2019-11-02 DIAGNOSIS — L237 Allergic contact dermatitis due to plants, except food: Secondary | ICD-10-CM

## 2019-11-08 ENCOUNTER — Other Ambulatory Visit: Payer: Self-pay | Admitting: Family Medicine

## 2019-11-08 DIAGNOSIS — M542 Cervicalgia: Secondary | ICD-10-CM | POA: Diagnosis not present

## 2019-11-08 DIAGNOSIS — R059 Cough, unspecified: Secondary | ICD-10-CM

## 2019-11-09 ENCOUNTER — Telehealth: Payer: Self-pay | Admitting: Family Medicine

## 2019-11-09 NOTE — Telephone Encounter (Signed)
Amoxicillin and medrol dose pack were given before.  Please review.

## 2019-11-09 NOTE — Telephone Encounter (Signed)
°  Prescription Request  11/09/2019  What is the name of the medication or equipment? Med for Bronchitis  Have you contacted your pharmacy to request a refill? (if applicable) Yes  Which pharmacy would you like this sent to? [facility] [place]  Pts husband called stating that the doctor prescribed pt some medicine for Bronchitis and says the medicine helped but now it seems like the Bronchitis has come back and pt needs refill on meds.   Patient notified that their request is being sent to the clinical staff for review and that they should receive a response within 2 business days.

## 2019-11-12 NOTE — Telephone Encounter (Signed)
Left message to please call our office to schedule an appointment for her bronchitis.

## 2019-11-12 NOTE — Telephone Encounter (Signed)
Please make her an appointment as we can not giver her more steroids as her A1C is elevated.

## 2019-11-13 ENCOUNTER — Ambulatory Visit (INDEPENDENT_AMBULATORY_CARE_PROVIDER_SITE_OTHER): Payer: Medicare HMO

## 2019-11-13 ENCOUNTER — Other Ambulatory Visit: Payer: Self-pay

## 2019-11-13 ENCOUNTER — Encounter: Payer: Self-pay | Admitting: Family

## 2019-11-13 ENCOUNTER — Ambulatory Visit (INDEPENDENT_AMBULATORY_CARE_PROVIDER_SITE_OTHER): Payer: BC Managed Care – PPO | Admitting: Family

## 2019-11-13 VITALS — BP 120/74 | HR 71 | Temp 97.4°F | Ht 62.0 in | Wt 179.6 lb

## 2019-11-13 DIAGNOSIS — J209 Acute bronchitis, unspecified: Secondary | ICD-10-CM

## 2019-11-13 DIAGNOSIS — I6930 Unspecified sequelae of cerebral infarction: Secondary | ICD-10-CM

## 2019-11-13 DIAGNOSIS — R059 Cough, unspecified: Secondary | ICD-10-CM

## 2019-11-13 DIAGNOSIS — R05 Cough: Secondary | ICD-10-CM | POA: Diagnosis not present

## 2019-11-13 DIAGNOSIS — J410 Simple chronic bronchitis: Secondary | ICD-10-CM

## 2019-11-13 DIAGNOSIS — J440 Chronic obstructive pulmonary disease with acute lower respiratory infection: Secondary | ICD-10-CM

## 2019-11-13 DIAGNOSIS — J90 Pleural effusion, not elsewhere classified: Secondary | ICD-10-CM | POA: Diagnosis not present

## 2019-11-13 MED ORDER — BENZONATATE 100 MG PO CAPS
100.0000 mg | ORAL_CAPSULE | Freq: Three times a day (TID) | ORAL | 0 refills | Status: DC | PRN
Start: 2019-11-13 — End: 2020-03-14

## 2019-11-13 MED ORDER — DOXYCYCLINE HYCLATE 100 MG PO TABS: 100.0000 mg | ORAL_TABLET | Freq: Two times a day (BID) | ORAL | 0 refills | Status: DC

## 2019-11-13 NOTE — Progress Notes (Signed)
Subjective:    Patient ID: Susan Patel, female    DOB: 27-Jul-1940, 79 y.o.   MRN: [id number]  Chief Complaint  Patient presents with  . Bronchitis    Cough This is a recurrent problem. The current episode started more than 1 month ago. The problem has been unchanged. The problem occurs every few minutes. The cough is non-productive. Associated symptoms include chills, headaches, rhinorrhea, shortness of breath and wheezing. Pertinent negatives include no ear congestion, ear pain, fever, nasal congestion, postnasal drip or sore throat. The symptoms are aggravated by lying down. Treatments tried: Augmentin and steriod. The treatment provided mild relief.   Pt and husband come in the office today with complaints of recurrent cough. Pt has had a CVA with late effects    Review of Systems  Constitutional: Positive for chills. Negative for fever.  HENT: Positive for rhinorrhea. Negative for ear pain, postnasal drip and sore throat.   Respiratory: Positive for cough, shortness of breath and wheezing.   Neurological: Positive for headaches.  All other systems reviewed and are negative.      Objective:   Physical Exam Vitals reviewed.  Constitutional:      General: She is not in acute distress.    Appearance: She is well-developed.  HENT:     Head: Normocephalic and atraumatic.  Eyes:     Pupils: Pupils are equal, round, and reactive to light.  Neck:     Thyroid: No thyromegaly.  Cardiovascular:     Rate and Rhythm: Normal rate and regular rhythm.     Heart sounds: Normal heart sounds. No murmur heard.   Pulmonary:     Effort: Pulmonary effort is normal. No respiratory distress.     Breath sounds: Normal breath sounds. No wheezing.  Abdominal:     General: Bowel sounds are normal. There is no distension.     Palpations: Abdomen is soft.     Tenderness: There is no abdominal tenderness.  Musculoskeletal:        General: No tenderness. Normal range of motion.     Cervical  back: Normal range of motion and neck supple.  Skin:    General: Skin is warm and dry.  Neurological:     Mental Status: She is oriented to person, place, and time.     Cranial Nerves: No cranial nerve deficit.     Deep Tendon Reflexes: Reflexes are normal and symmetric.  Psychiatric:        Mood and Affect: Affect is flat.        Behavior: Behavior normal.        Thought Content: Thought content normal.        Cognition and Memory: Cognition is impaired.       BP 120/74   Pulse 71   Temp (!) 97.4 F (36.3 C) (Temporal)   Ht 5\' 2"  (1.575 m)   Wt 179 lb 9.6 oz (81.5 kg)   SpO2 97%   BMI 32.85 kg/m      Assessment & Plan:  Susan Patel comes in today with chief complaint of Bronchitis   Diagnosis and orders addressed:  1. Cough - DG Chest 2 View; Future - doxycycline (VIBRA-TABS) 100 MG tablet; Take 1 tablet (100 mg total) by mouth 2 (two) times daily.  Dispense: 20 tablet; Refill: 0  2. Acute bronchitis with COPD ([place]) - doxycycline (VIBRA-TABS) 100 MG tablet; Take 1 tablet (100 mg total) by mouth 2 (two) times daily.  Dispense: 20 tablet;  Refill: 0  3. 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 -Throat lozenges if help -RTO if symptoms worsen or do not improve    Evelina Dun, FNP

## 2019-11-13 NOTE — Patient Instructions (Signed)
Acute Bronchitis, Adult  Acute bronchitis is sudden or acute swelling of the air tubes (bronchi) in the lungs. Acute bronchitis causes these tubes to fill with mucus, which can make it hard to breathe. It can also cause coughing or wheezing. In adults, acute bronchitis usually goes away within 2 weeks. A cough caused by bronchitis may last up to 3 weeks. Smoking, allergies, and asthma can make the condition worse. What are the causes? This condition can be caused by germs and by substances that irritate the lungs, including:  Cold and flu viruses. The most common cause of this condition is the virus that causes the common cold.  Bacteria.  Substances that irritate the lungs, including: ? Smoke from cigarettes and other forms of tobacco. ? Dust and pollen. ? Fumes from chemical products, gases, or burned fuel. ? Other materials that pollute indoor or outdoor air.  Close contact with someone who has acute bronchitis. What increases the risk? The following factors may make you more likely to develop this condition:  A weak body's defense system, also called the immune system.  A condition that affects your lungs and breathing, such as asthma. What are the signs or symptoms? Common symptoms of this condition include:  Lung and breathing problems, such as: ? Coughing. This may bring up clear, yellow, or green mucus from your lungs (sputum). ? Wheezing. ? Having too much mucus in your lungs (chest congestion). ? Having shortness of breath.  A fever.  Chills.  Aches and pains, including: ? Tightness in your chest and other body aches. ? A sore throat. How is this diagnosed? This condition is usually diagnosed based on:  Your symptoms and medical history.  A physical exam. You may also have other tests, including tests to rule out other conditions, such pneumonia. These tests include:  A test of lung function.  Test of a mucus sample to look for the presence of  bacteria.  Tests to check the oxygen level in your blood.  Blood tests.  Chest X-ray. How is this treated? Most cases of acute bronchitis clear up over time without treatment. Your health care provider may recommend:  Drinking more fluids. This can thin your mucus, which may improve your breathing.  Taking a medicine for a fever or cough.  Using a device that gets medicine into your lungs (inhaler) to help improve breathing and control coughing.  Using a vaporizer or a humidifier. These are machines that add water to the air to help you breathe better. Follow these instructions at home: Activity  Get plenty of rest.  Return to your normal activities as told by your health care provider. Ask your health care provider what activities are safe for you. Lifestyle  Drink enough fluid to keep your urine pale yellow.  Do not drink alcohol.  Do not use any products that contain nicotine or tobacco, such as cigarettes, e-cigarettes, and chewing tobacco. If you need help quitting, ask your health care provider. Be aware that: ? Your bronchitis will get worse if you smoke or breathe in other people's smoke (secondhand smoke). ? Your lungs will heal faster if you quit smoking. General instructions   Take over-the-counter and prescription medicines only as told by your health care provider.  Use an inhaler, vaporizer, or humidifier as told by your health care provider.  If you have a sore throat, gargle with a salt-water mixture 3-4 times a day or as needed. To make a salt-water mixture, completely dissolve -1 tsp (3-6   g) of salt in 1 cup (237 mL) of warm water.  Keep all follow-up visits as told by your health care provider. This is important. How is this prevented? To lower your risk of getting this condition again:  Wash your hands often with soap and water. If soap and water are not available, use hand sanitizer.  Avoid contact with people who have cold symptoms.  Try not to  touch your mouth, nose, or eyes with your hands.  Avoid places where there are fumes from chemicals. Breathing these fumes will make your condition worse.  Get the flu shot every year. Contact a health care provider if:  Your symptoms do not improve after 2 weeks of treatment.  You vomit more than once or twice.  You have symptoms of dehydration such as: ? Dark urine. ? Dry skin or eyes. ? Increased thirst. ? Headaches. ? Confusion. ? Muscle cramps. Get help right away if you:  Cough up blood.  Feel pain in your chest.  Have severe shortness of breath.  Faint or keep feeling like you are going to faint.  Have a severe headache.  Have fever or chills that get worse. These symptoms may represent a serious problem that is an emergency. Do not wait to see if the symptoms will go away. Get medical help right away. Call your local emergency services (911 in the [place] Do not drive yourself to the hospital. Summary  Acute bronchitis is sudden (acute) inflammation of the air tubes (bronchi) between the windpipe and the lungs. In adults, acute bronchitis usually goes away within 2 weeks, although coughing may last 3 weeks or longer  Take over-the-counter and prescription medicines only as told by your health care provider.  Drink enough fluid to keep your urine pale yellow.  Contact a health care provider if your symptoms do not improve after 2 weeks of treatment.  Get help right away if you cough up blood, faint, or have chest pain or shortness of breath. This information is not intended to replace advice given to you by your health care provider. Make sure you discuss any questions you have with your health care provider. Document Revised: 01/15/2019 Document Reviewed: 11/24/2018 Elsevier Patient Education  2020 [employer]  

## 2019-11-20 ENCOUNTER — Telehealth: Payer: Self-pay | Admitting: Family Medicine

## 2019-11-20 NOTE — Telephone Encounter (Signed)
  Incoming Patient Call  11/20/2019  What symptoms do you have? Totally weak, not breathing deep, cough  How long have you been sick? For about three weeks or four  Have you been seen for this problem? Yes was seen last week and is not doing better   If your provider decides to give you a prescription, which pharmacy would you like for it to be sent to? [facility]    Patient informed that this information will be sent to the clinical staff for review and that they should receive a follow up call.

## 2019-11-20 NOTE — Telephone Encounter (Signed)
Pt scheduled in afterhours tomorrow at 5 since she isn't feeling any better.

## 2019-11-21 ENCOUNTER — Telehealth (INDEPENDENT_AMBULATORY_CARE_PROVIDER_SITE_OTHER): Payer: Medicare HMO | Admitting: Family Medicine

## 2019-11-21 ENCOUNTER — Telehealth: Payer: Self-pay | Admitting: Family Medicine

## 2019-11-21 DIAGNOSIS — J189 Pneumonia, unspecified organism: Secondary | ICD-10-CM

## 2019-11-21 DIAGNOSIS — I10 Essential (primary) hypertension: Secondary | ICD-10-CM | POA: Diagnosis not present

## 2019-11-21 DIAGNOSIS — R531 Weakness: Secondary | ICD-10-CM | POA: Diagnosis not present

## 2019-11-21 NOTE — Progress Notes (Signed)
Telephone visit  Subjective: CC: cough/ PNA PCP: Dettinger, Fransisca Kaufmann, MD Susan Patel is a 79 y.o. female. Patient provides verbal consent for consult held via phone.  Due to COVID-19 pandemic this visit was conducted virtually. This visit type was conducted due to national recommendations for restrictions regarding the COVID-19 Pandemic (e.g. social distancing, sheltering in place) in an effort to limit this patient's exposure and mitigate transmission in our community. All issues noted in this document were discussed and addressed.  A physical exam was not performed with this format.   Location of patient: home Location of provider: [facility] Others present for call: daughter  1. LLL Pneumonia Patient had a chest x-ray on 11/13/2019 which showed a left lower lobe infiltrate suggestive of pneumonia.  She has now been treated with both Augmentin and doxycycline and yet the symptoms are progressing.  She feels more fatigued.  Her daughter is present who notes that she does seem to be looking worse.  She appears to be breathing very shallowly.  She has been doing breathing treatments but is unable to breath deep enough to get the full effect of treatment.   ROS: Per HPI  Allergies  Allergen Reactions   Azithromycin Other (See Comments)    Renal failure   Prednisone Other (See Comments)    Irritability and insomnia   Sulfamethoxazole-Trimethoprim Other (See Comments)    Renal failure   Amlodipine Swelling    Caused swelling in legs   Ciprofloxacin Itching and Rash   Tape Rash   Past Medical History:  Diagnosis Date   Arthritis    'all over"   Asthma    Chronic pain    COPD (chronic obstructive pulmonary disease) (HCC)    O2 per [place] at nights    Depression    Diabetes mellitus without complication (HCC)    borderline- , states she was on med., but MD told her "everything is under control so I threw the bottle away"   Fibromyalgia    GERD (gastroesophageal reflux  disease)    Hypertension    Neuromuscular disorder ([place])    parkinson, neuropathy- both feet & hands.   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 (Patient taking differently: Take 650 mg by mouth every 6 (six) hours as needed for moderate pain or headache. ), Disp: 30 tablet, Rfl: 0   albuterol (PROVENTIL HFA;VENTOLIN HFA) 108 (90 Base) MCG/ACT inhaler, Inhale 1-2 puffs into the lungs every 6 (six) hours as needed for wheezing or shortness of breath., Disp: 1 Inhaler, Rfl: 1   aspirin 81 MG EC tablet, TAKE 1 TABLET (81 MG TOTAL) BY MOUTH DAILY FOR 90 DAYS., Disp: 90 tablet, Rfl: 1   benzonatate (TESSALON PERLES) 100 MG capsule, Take 1 capsule (100 mg total) by mouth 3 (three) times daily as needed for cough., Disp: 20 capsule, Rfl: 0   BREO ELLIPTA 200-25 MCG/INH AEPB, INHALE 1 PUFF BY MOUTH EVERY DAY, Disp: 60 each, Rfl: 4   carvedilol (COREG) 12.5 MG tablet, TAKE 1 TABLET (12.5 MG TOTAL) BY MOUTH 2 (TWO) TIMES DAILY. (NEEDS TO BE SEEN BEFORE NEXT REFILL), Disp: 180 tablet, Rfl: 0   clopidogrel (PLAVIX) 75 MG tablet, Take 1 tablet (75 mg total) by mouth daily., Disp: 90 tablet, Rfl: 3   doxycycline (VIBRA-TABS) 100 MG tablet, Take 1 tablet (100 mg  total) by mouth 2 (two) times daily., Disp: 20 tablet, Rfl: 0   famotidine (PEPCID) 10 MG tablet, Take 1 tablet (10 mg total) by mouth daily as needed for heartburn or indigestion., Disp: , Rfl:    fluticasone (FLONASE) 50 MCG/ACT nasal spray, SPRAY 2 SPRAYS INTO EACH NOSTRIL EVERY DAY, Disp: 48 mL, Rfl: 0   glucose blood test strip, Use as instructed, Disp: 100 each, Rfl: 2   HYDROcodone-homatropine (HYCODAN) 5-1.5 MG/5ML syrup, Take 5 mLs by mouth every 6 (six) hours as needed for cough., Disp: 120 mL, Rfl: 0   insulin degludec (TRESIBA FLEXTOUCH) 100 UNIT/ML FlexTouch Pen, Inject 0.1 mLs (10  Units total) into the skin daily., Disp: 15 mL, Rfl: 3   Insulin Pen Needle (B-D UF III MINI PEN NEEDLES) 31G X 5 MM MISC, 1 each by Does not apply route daily., Disp: 90 each, Rfl: 3   isosorbide mononitrate (IMDUR) 30 MG 24 hr tablet, TAKE 1 TABLET BY MOUTH EVERY DAY, Disp: 90 tablet, Rfl: 3   Lancets (ONETOUCH ULTRASOFT) lancets, Use as instructed, Disp: 100 each, Rfl: 12   latanoprost (XALATAN) 0.005 % ophthalmic solution, Place 1 drop into both eyes at bedtime., Disp: 2.5 mL, Rfl: 0   LINZESS 290 MCG CAPS capsule, TAKE 1 CAPSULE BY MOUTH EVERY DAY BEFORE BREAKFAST, Disp: 90 capsule, Rfl: 1   methylPREDNISolone (MEDROL DOSEPAK) 4 MG TBPK tablet, Use as directed on package., Disp: 21 each, Rfl: 0   montelukast (SINGULAIR) 10 MG tablet, TAKE 1 TABLET BY MOUTH EVERY DAY, Disp: 90 tablet, Rfl: 3   nitroGLYCERIN (NITROSTAT) 0.4 MG SL tablet, PLACE 1 TABLET (0.4 MG TOTAL) UNDER THE TONGUE EVERY 5 (FIVE) MINUTES AS NEEDED., Disp: 25 tablet, Rfl: 3   ONETOUCH DELICA LANCETS FINE MISC, TEST TWICE DAILY, Disp: 100 each, Rfl: 9   OXYGEN, Inhale 3-4 L into the lungs at bedtime., Disp: , Rfl:    polyethylene glycol powder (GLYCOLAX/MIRALAX) 17 GM/SCOOP powder, Take by mouth., Disp: , Rfl:    rosuvastatin (CRESTOR) 10 MG tablet, Take 1 tablet (10 mg total) by mouth every evening., Disp: 90 tablet, Rfl: 1   traMADol (ULTRAM) 50 MG tablet, Take 1 tablet (50 mg total) by mouth every 8 (eight) hours as needed., Disp: 60 tablet, Rfl: 2   traZODone (DESYREL) 50 MG tablet, TAKE 1 TABLET BY MOUTH EVERYDAY AT BEDTIME, Disp: 90 tablet, Rfl: 3   triamcinolone cream (KENALOG) 0.1 %, APPLY TO AFFECTED AREA TWICE A DAY, Disp: 790 g, Rfl: 2   TRULICITY 2.40 XB/3.5HG SOPN, INJECT 0.75 MG INTO THE SKIN ONCE A WEEK., Disp: 6 mL, Rfl: 0   vitamin B-12 (CYANOCOBALAMIN) 1000 MCG tablet, Take 1,000 mcg by mouth daily., Disp: , Rfl:    Assessment/ Plan: 79 y.o. female   1. Community acquired pneumonia of left  lower lobe of lung She has a curb 65 score of at least 1-2 with BUN in April 2020 being exactly at 21.  She is had no labs since.  Unfortunately, this was a televisit and I could not count her respiratory rate nor measure blood pressure/heart rate etc.  I am very concerned given what appears to be a failure of now to outpatient oral antibiotics, allergy to azithromycin and Cipro.  She likely needs admission for inpatient IV antibiotics.  She has multiple risk factors for adverse events.  I discussed this with her daughter and spouse.  Unfortunately, patient is not willing to seek care in the emergency department tonight.  She  wants to wait till tomorrow because it is "her husband's birthday".  I have encouraged her to please seek immediate medical care as I worry that her risk for adverse events is extremely high if she continues delay care.  I have encouraged her family members to consider calling EMS for at minimum an evaluation but I suspect she certainly will need transport to the ED.   Start time: 3:56pm End time: 4:02pm  Total time spent on patient care (including video visit/ documentation): 15 minutes  [place], [place] [phone]

## 2019-11-22 ENCOUNTER — Emergency Department (HOSPITAL_COMMUNITY)
Admission: EM | Admit: 2019-11-22 | Discharge: 2019-11-22 | Disposition: A | Discharge status: Home / Self Care | Payer: Medicare HMO | Attending: Emergency Medicine | Admitting: Emergency Medicine

## 2019-11-22 ENCOUNTER — Other Ambulatory Visit: Payer: Self-pay

## 2019-11-22 ENCOUNTER — Emergency Department (HOSPITAL_COMMUNITY): Payer: Medicare HMO

## 2019-11-22 DIAGNOSIS — R5383 Other fatigue: Secondary | ICD-10-CM | POA: Diagnosis not present

## 2019-11-22 DIAGNOSIS — Z7982 Long term (current) use of aspirin: Secondary | ICD-10-CM | POA: Insufficient documentation

## 2019-11-22 DIAGNOSIS — J449 Chronic obstructive pulmonary disease, unspecified: Secondary | ICD-10-CM | POA: Insufficient documentation

## 2019-11-22 DIAGNOSIS — I10 Essential (primary) hypertension: Secondary | ICD-10-CM | POA: Diagnosis not present

## 2019-11-22 DIAGNOSIS — R531 Weakness: Secondary | ICD-10-CM | POA: Diagnosis not present

## 2019-11-22 DIAGNOSIS — Z7902 Long term (current) use of antithrombotics/antiplatelets: Secondary | ICD-10-CM | POA: Diagnosis not present

## 2019-11-22 DIAGNOSIS — Z794 Long term (current) use of insulin: Secondary | ICD-10-CM | POA: Diagnosis not present

## 2019-11-22 DIAGNOSIS — I509 Heart failure, unspecified: Secondary | ICD-10-CM | POA: Insufficient documentation

## 2019-11-22 DIAGNOSIS — Z7951 Long term (current) use of inhaled steroids: Secondary | ICD-10-CM | POA: Insufficient documentation

## 2019-11-22 DIAGNOSIS — E119 Type 2 diabetes mellitus without complications: Secondary | ICD-10-CM | POA: Diagnosis not present

## 2019-11-22 DIAGNOSIS — I110 Hypertensive heart disease with heart failure: Secondary | ICD-10-CM | POA: Insufficient documentation

## 2019-11-22 DIAGNOSIS — J189 Pneumonia, unspecified organism: Secondary | ICD-10-CM | POA: Diagnosis not present

## 2019-11-22 DIAGNOSIS — G20 Parkinson's disease: Secondary | ICD-10-CM | POA: Insufficient documentation

## 2019-11-22 DIAGNOSIS — Z79899 Other long term (current) drug therapy: Secondary | ICD-10-CM | POA: Insufficient documentation

## 2019-11-22 DIAGNOSIS — I2510 Atherosclerotic heart disease of native coronary artery without angina pectoris: Secondary | ICD-10-CM | POA: Diagnosis not present

## 2019-11-22 DIAGNOSIS — J45909 Unspecified asthma, uncomplicated: Secondary | ICD-10-CM | POA: Insufficient documentation

## 2019-11-22 DIAGNOSIS — R0602 Shortness of breath: Secondary | ICD-10-CM | POA: Diagnosis not present

## 2019-11-22 LAB — CBC WITH DIFFERENTIAL/PLATELET
Abs Immature Granulocytes: 0.01 10*3/uL (ref 0.00–0.07)
Basophils Absolute: 0.1 10*3/uL (ref 0.0–0.1)
Basophils Relative: 1 %
Eosinophils Absolute: 0.3 10*3/uL (ref 0.0–0.5)
Eosinophils Relative: 4 %
HCT: 41.9 % (ref 36.0–46.0)
Hemoglobin: 13.3 g/dL (ref 12.0–15.0)
Immature Granulocytes: 0 %
Lymphocytes Relative: 24 %
Lymphs Abs: 1.6 10*3/uL (ref 0.7–4.0)
MCH: 29 pg (ref 26.0–34.0)
MCHC: 31.7 g/dL (ref 30.0–36.0)
MCV: 91.3 fL (ref 80.0–100.0)
Monocytes Absolute: 0.6 10*3/uL (ref 0.1–1.0)
Monocytes Relative: 9 %
Neutro Abs: 4.3 10*3/uL (ref 1.7–7.7)
Neutrophils Relative %: 62 %
Platelets: 249 10*3/uL (ref 150–400)
RBC: 4.59 MIL/uL (ref 3.87–5.11)
RDW: 13.5 % (ref 11.5–15.5)
WBC: 6.8 10*3/uL (ref 4.0–10.5)
nRBC: 0 % (ref 0.0–0.2)

## 2019-11-22 LAB — COMPREHENSIVE METABOLIC PANEL
ALT: 14 U/L (ref 0–44)
AST: 16 U/L (ref 15–41)
Albumin: 3.6 g/dL (ref 3.5–5.0)
Alkaline Phosphatase: 93 U/L (ref 38–126)
Anion gap: 10 (ref 5–15)
BUN: 23 mg/dL (ref 8–23)
CO2: 27 mmol/L (ref 22–32)
Calcium: 9.1 mg/dL (ref 8.9–10.3)
Chloride: 102 mmol/L (ref 98–111)
Creatinine, Ser: 0.86 mg/dL (ref 0.44–1.00)
GFR calc Af Amer: >60 mL/min (ref >60)
GFR calc non Af Amer: >60 mL/min (ref >60)
Glucose, Bld: 106 mg/dL — ABNORMAL HIGH (ref 70–99)
Potassium: 3.9 mmol/L (ref 3.5–5.1)
Sodium: 139 mmol/L (ref 135–145)
Total Bilirubin: 0.6 mg/dL (ref 0.3–1.2)
Total Protein: 6.6 g/dL (ref 6.5–8.1)

## 2019-11-22 LAB — URINALYSIS, ROUTINE W REFLEX MICROSCOPIC
Bacteria, UA: NONE SEEN
Bilirubin Urine: NEGATIVE
Glucose, UA: NEGATIVE mg/dL
Hgb urine dipstick: NEGATIVE
Ketones, ur: NEGATIVE mg/dL
Nitrite: NEGATIVE
Protein, ur: NEGATIVE mg/dL
Specific Gravity, Urine: 1.01 (ref 1.005–1.030)
pH: 5 (ref 5.0–8.0)

## 2019-11-22 LAB — TROPONIN I (HIGH SENSITIVITY)
Troponin I (High Sensitivity): 8 ng/L (ref <18)
Troponin I (High Sensitivity): 9 ng/L (ref <18)

## 2019-11-22 LAB — LIPASE, BLOOD: Lipase: 18 U/L (ref 11–51)

## 2019-11-22 MED ORDER — SODIUM CHLORIDE 0.9 % IV BOLUS
1000.0000 mL | Freq: Once | INTRAVENOUS | Status: AC
  Administered 2019-11-22: 1000 mL via INTRAVENOUS

## 2019-11-22 NOTE — ED Notes (Signed)
Patient with no complaints at this time. Respirations even and unlabored. Skin warm/dry. Discharge instructions reviewed with patient at this time. Patient given opportunity to voice concerns/ask questions. IV removed per policy and band-aid applied to site. Patient discharged at this time and left Emergency Department with steady gait.  

## 2019-11-22 NOTE — ED Provider Notes (Signed)
[facility] EMERGENCY DEPARTMENT Provider Note   CSN: [id number] Arrival date & time: 11/22/19  1549     History Chief Complaint  Patient presents with  . Weakness    Susan Patel is a 79 y.o. female with a history of Parkinson's, stroke with residual dysarthria, hypertension, borderline diabetes, COPD on supplemental oxygen only at night, presenting to emergency department with generalized weakness.  History is largely provided by the patient's husband at bedside, as the patient is slow to respond and has comprehension problems (which her husband states is at [place] today).  Her husband tells me the patient has recently completed 3 courses of treatment for community-acquired pneumonia by their PCP.  Please try to schedule follow-up appointment in the office today but referred into the emergency department.  He expresses concern that his wife may be "dehydrated".  He says that she has been eating fine, with no nausea, vomiting, or diarrhea, but she has not been drinking many fluids.  She drinks iced tea but not a lot of water.  He does report she is a history of cystitis in the past.  Patient herself denies to me that she has any dysuria, flank pain, abdominal pain, chest pain or pressure.  Her husband reports that she does have a history of cardiac stent in the past and coronary artery disease.  She also has a history of COPD.  She does not feel like she is wheezing or any difficulty breathing.  She does not smoke.  At baseline she is ambulatory at home with the help of a walker or cane.  She has also been ambulatory the past few days.  There are no neuro neurological deficits, numbness or weakness reported by the patient and her husband.  She has no headache currently.  She tells me she feels "fine" but "tired."  She did receive both covid vaccines.  Medical records show xray chest on 11/13/19 showing airspace consolidation in LLL, right lung clear.  Family medicine note from yesterday  by Dr Ronnie Doss DO reports pt has been treated with both Augmentin and doxycycline but PCP is concerned that her general fatigue and shallow breathing, and that she may be failing outpatient PO antibiotics.  This was a telemetry visit.  HPI     Past Medical History:  Diagnosis Date  . Arthritis    'all over"  . Asthma   . Chronic pain   . COPD (chronic obstructive pulmonary disease) (HCC)    O2 per [place] at nights   . Depression   . Diabetes mellitus without complication ([place])    borderline- , states she was on med., but MD told her "everything is under control so I threw the bottle away"  . Fibromyalgia   . GERD (gastroesophageal reflux disease)   . Hypertension   . Neuromuscular disorder (HCC)    parkinson, neuropathy- both feet & hands.  . Stroke [facility])    residual dysarthria    Patient Active Problem List   Diagnosis Date Noted  . Chronic systolic CHF (congestive heart failure) ([place]) 08/21/2018  . Cerebrovascular disease 08/21/2018  . Acute encephalopathy 08/21/2018  . Altered mental status 08/20/2018  . Pulmonary nodules 06/07/2018  . Coronary artery disease   . 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 History of stroke 04/04/2017  . 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 Contusion of left elbow 03/15/2017  . Dysmetria   . Supplemental oxygen dependent   . Hypoalbuminemia due to protein-calorie malnutrition ([place])   .  Oropharyngeal dysphagia   . 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 Frequent falls 04/21/2016  . Type 2 diabetes mellitus with peripheral neuropathy (HCC)   . Fibromyalgia   . Chronic pain syndrome   . Chronic obstructive pulmonary disease ([place])   . Gait disturbance, post-stroke   . Dysarthria, post-stroke   . Dysphagia, post-stroke   . AKI (acute kidney injury) ([place])   . Mixed hyperlipidemia   . 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  . Allergic rhinitis 12/17/2015  . Primary osteoarthritis of both knees  11/19/2015  . Obesity (BMI 30-39.9) 09/04/2015  . HTN (hypertension) 09/04/2015  . Constipation 09/04/2015  . Raynaud's phenomenon 08/26/2015  . Gastro-esophageal reflux disease with esophagitis 08/26/2015  . Spinal stenosis 08/26/2015  . Vitamin D deficiency 08/26/2015  . DDD (degenerative disc disease), cervical 07/10/2015  . Cervical radicular pain 04/30/2013  . Knee osteoarthritis 01/05/2012    Past Surgical History:  Procedure Laterality Date  . ABDOMINAL HYSTERECTOMY    . ANTERIOR CERVICAL DECOMP/DISCECTOMY FUSION N/A 07/10/2015   Procedure: Cervical five-six, Cervical six-seven anterior cervical decompression with fusion plating and bonegraft;  Surgeon: Jovita Gamma, MD;  Location: [facility];  Service: Neurosurgery;  Laterality: N/A;  . APPENDECTOMY    . BIOPSY EYE MUSCLE  03/20/2016   biopsy vessel to right eye due to swelling  . EYE SURGERY Bilateral    cataracts removed, /w "cyrstal lenses"   . LEFT HEART CATH AND CORONARY ANGIOGRAPHY N/A 05/23/2018   Procedure: LEFT HEART CATH AND CORONARY ANGIOGRAPHY;  Surgeon: Troy Sine, MD;  Location: [facility];  Service: Cardiovascular;  Laterality: N/A;  . SHOULDER ARTHROSCOPY Right    x2   RCR- spurs removed      OB History   No obstetric history on file.     Family History  Problem Relation Age of Onset  . Heart disease Mother   . Cancer Father   . Heart disease Father   . Arthritis Sister   . Heart failure Sister   . Cancer Brother   . Thyroid cancer Son   . Atrial fibrillation Son   . Arthritis Sister     Social History   Tobacco Use  . Smoking status: Never Smoker  . Smokeless tobacco: Never Used  Vaping Use  . Vaping Use: Never used  Substance Use Topics  . Alcohol use: No  . Drug use: No    Home Medications Prior to Admission medications   Medication Sig Start Date End Date Taking? Authorizing Provider  albuterol (PROVENTIL HFA;VENTOLIN HFA) 108 (90 Base) MCG/ACT inhaler Inhale 1-2  puffs into the lungs every 6 (six) hours as needed for wheezing or shortness of breath. 01/01/16  Yes Eustaquio Maize, MD  aspirin 81 MG EC tablet TAKE 1 TABLET (81 MG TOTAL) BY MOUTH DAILY FOR 90 DAYS. 08/21/19  Yes Dettinger, Fransisca Kaufmann, MD  benzonatate (TESSALON PERLES) 100 MG capsule Take 1 capsule (100 mg total) by mouth 3 (three) times daily as needed for cough. 11/13/19  Yes Hawks, Christy A, FNP  BREO ELLIPTA 200-25 MCG/INH AEPB INHALE 1 PUFF BY MOUTH EVERY DAY 08/13/19  Yes Dettinger, Fransisca Kaufmann, MD  carvedilol (COREG) 12.5 MG tablet TAKE 1 TABLET (12.5 MG TOTAL) BY MOUTH 2 (TWO) TIMES DAILY. (NEEDS TO BE SEEN BEFORE NEXT REFILL) 11/08/19  Yes Dettinger, Fransisca Kaufmann, MD  clopidogrel (PLAVIX) 75 MG tablet Take 1 tablet (75 mg total) by mouth daily. 07/31/18  Yes Dettinger,  Fransisca Kaufmann, MD  doxycycline (VIBRA-TABS) 100 MG tablet Take 1 tablet (100 mg total) by mouth 2 (two) times daily. 11/13/19  Yes Hawks, Christy A, FNP  famotidine (PEPCID) 10 MG tablet Take 1 tablet (10 mg total) by mouth daily as needed for heartburn or indigestion. Patient taking differently: Take 10 mg by mouth daily.  08/26/18  Yes Johnson, Clanford L, MD  fluticasone (FLONASE) 50 MCG/ACT nasal spray SPRAY 2 SPRAYS INTO EACH NOSTRIL EVERY DAY 11/08/19  Yes Dettinger, Fransisca Kaufmann, MD  insulin degludec (TRESIBA FLEXTOUCH) 100 UNIT/ML FlexTouch Pen Inject 0.1 mLs (10 Units total) into the skin daily. Patient taking differently: Inject 10 Units into the skin daily. If under 120, not given. 07/26/19  Yes Dettinger, Fransisca Kaufmann, MD  isosorbide mononitrate (IMDUR) 30 MG 24 hr tablet TAKE 1 TABLET BY MOUTH EVERY DAY 05/23/19  Yes Herminio Commons, MD  LINZESS 290 MCG CAPS capsule TAKE 1 CAPSULE BY MOUTH EVERY DAY BEFORE BREAKFAST 10/01/19  Yes Dettinger, Fransisca Kaufmann, MD  methocarbamol (ROBAXIN) 750 MG tablet Take 750 mg by mouth daily as needed. 11/21/19  Yes [provider]  nitroGLYCERIN (NITROSTAT) 0.4 MG SL tablet PLACE 1 TABLET (0.4 MG TOTAL)  UNDER THE TONGUE EVERY 5 (FIVE) MINUTES AS NEEDED. 03/14/19  Yes Herminio Commons, MD  OXYGEN Inhale 3-4 L into the lungs at bedtime.   Yes [provider]  rosuvastatin (CRESTOR) 10 MG tablet Take 1 tablet (10 mg total) by mouth every evening. 08/13/19  Yes Dettinger, Fransisca Kaufmann, MD  traMADol (ULTRAM) 50 MG tablet Take 1 tablet (50 mg total) by mouth every 8 (eight) hours as needed. Patient taking differently: Take 50 mg by mouth 2 (two) times daily.  07/26/19  Yes Dettinger, Fransisca Kaufmann, MD  traZODone (DESYREL) 50 MG tablet TAKE 1 TABLET BY MOUTH EVERYDAY AT BEDTIME 08/20/19  Yes Dettinger, Fransisca Kaufmann, MD  triamcinolone cream (KENALOG) 0.1 % APPLY TO AFFECTED AREA TWICE A DAY 11/02/19  Yes Dettinger, Fransisca Kaufmann, MD  TRULICITY 2.09 OB/0.9GG SOPN INJECT 0.75 MG INTO THE SKIN ONCE A WEEK. 09/14/19  Yes Dettinger, Fransisca Kaufmann, MD  vitamin B-12 (CYANOCOBALAMIN) 1000 MCG tablet Take 1,000 mcg by mouth daily.   Yes [provider]  acetaminophen (TYLENOL) 325 MG tablet Take 2 tablets (650 mg total) by mouth every 6 (six) hours as needed. Patient not taking: Reported on 11/22/2019 02/23/18   Varney Biles, MD  glucose blood test strip Use as instructed 07/04/17   Eustaquio Maize, MD  HYDROcodone-homatropine [facility]) 5-1.5 MG/5ML syrup Take 5 mLs by mouth every 6 (six) hours as needed for cough. Patient not taking: Reported on 11/22/2019 06/08/19   Dettinger, Fransisca Kaufmann, MD  Insulin Pen Needle (B-D UF III MINI PEN NEEDLES) 31G X 5 MM MISC 1 each by Does not apply route daily. Patient not taking: Reported on 11/22/2019 07/26/19   Dettinger, Fransisca Kaufmann, MD  latanoprost (XALATAN) 0.005 % ophthalmic solution Place 1 drop into both eyes at bedtime. Patient not taking: Reported on 11/22/2019 08/26/18   Murlean Iba, MD  methylPREDNISolone (MEDROL DOSEPAK) 4 MG TBPK tablet Use as directed on package. Patient not taking: Reported on 11/22/2019 10/26/19   Loman Brooklyn, FNP  montelukast (SINGULAIR) 10 MG tablet  TAKE 1 TABLET BY MOUTH EVERY DAY Patient not taking: Reported on 11/22/2019 09/20/19   Dettinger, Fransisca Kaufmann, MD  [place] LANCETS FINE [place] TEST TWICE DAILY Patient not taking: Reported on 11/22/2019 12/26/17   Assunta Found  L, MD    Allergies    Azithromycin, Prednisone, Sulfamethoxazole-trimethoprim, Amlodipine, Ciprofloxacin, and Tape  Review of Systems   Review of Systems  Constitutional: Positive for fatigue. Negative for chills and fever.  HENT: Negative for ear pain and sore throat.   Eyes: Negative for pain and visual disturbance.  Respiratory: Positive for cough. Negative for shortness of breath.   Cardiovascular: Negative for chest pain and palpitations.  Gastrointestinal: Negative for abdominal pain and vomiting.  Genitourinary: Negative for dysuria and hematuria.  Musculoskeletal: Negative for arthralgias and back pain.  Skin: Negative for color change and rash.  Neurological: Negative for syncope, light-headedness and headaches.  All other systems reviewed and are negative.   Physical Exam Updated Vital Signs BP (!) 181/84 (BP Location: Left Arm)   Pulse 77   Temp 98.3 F (36.8 C) (Oral)   Resp 14   Ht 5\' 2"  (1.575 m)   Wt 81.5 kg   SpO2 98%   BMI 32.85 kg/m   Physical Exam Vitals and nursing note reviewed.  Constitutional:      General: She is not in acute distress.    Appearance: She is well-developed.  HENT:     Head: Normocephalic and atraumatic.  Eyes:     Conjunctiva/sclera: Conjunctivae normal.  Cardiovascular:     Rate and Rhythm: Normal rate and regular rhythm.     Pulses: Normal pulses.  Pulmonary:     Effort: Pulmonary effort is normal. No respiratory distress.     Breath sounds: Normal breath sounds.  Abdominal:     Palpations: Abdomen is soft.     Tenderness: There is no abdominal tenderness.  Musculoskeletal:     Cervical back: Neck supple.  Skin:    General: Skin is warm and dry.  Neurological:     General: No focal deficit  present.     Mental Status: She is alert. Mental status is at baseline.     Comments: No deficits reported by husband, speech and mentation at baseline, good strength in extremities     ED Results / Procedures / Treatments   Labs (all labs ordered are listed, but only abnormal results are displayed) Labs Reviewed  COMPREHENSIVE METABOLIC PANEL - Abnormal; Notable for the following components:      Result Value   Glucose, Bld 106 (*)    All other components within normal limits  URINALYSIS, ROUTINE W REFLEX MICROSCOPIC - Abnormal; Notable for the following components:   Leukocytes,Ua MODERATE (*)    All other components within normal limits  LIPASE, BLOOD  CBC WITH DIFFERENTIAL/PLATELET  TROPONIN I (HIGH SENSITIVITY)  TROPONIN I (HIGH SENSITIVITY)    EKG EKG Interpretation  Date/Time:  Thursday November 22 2019 16:45:04 EDT Ventricular Rate:  69 PR Interval:    QRS Duration: 117 QT Interval:  393 QTC Calculation: 421 R Axis:   5 Text Interpretation: Sinus rhythm Incomplete left bundle branch block LVH with secondary repolarization abnormality No STEMI Confirmed by Octaviano Glow [phone]) on 11/22/2019 5:15:16 PM   Radiology DG Chest 2 View  Result Date: 11/22/2019 CLINICAL DATA:  Shortness of breath the. EXAM: CHEST - 2 VIEW COMPARISON:  Chest x-ray dated November 13, 2019. FINDINGS: The heart size and mediastinal contours are within normal limits. Both lungs are clear. The visualized skeletal structures are unremarkable. IMPRESSION: No active cardiopulmonary disease. Resolved left lower lobe pneumonia. Electronically Signed   By: Titus Dubin M.D.   On: 11/22/2019 18:11    Procedures Procedures (including critical care  time)  Medications Ordered in ED Medications  sodium chloride 0.9 % bolus 1,000 mL (0 mLs Intravenous Stopped 11/22/19 1917)    ED Course  I have reviewed the triage vital signs and the nursing notes.  Pertinent labs & imaging results that were available  during my care of the patient were reviewed by me and considered in my medical decision making (see chart for details).  79 yo female here with fatigue for several days, just finished treatment with PO antibiotics for PNA last month via PCP.    Patient has no focal complaints.  PCP note per review expresses concern about the patient's general appearance on tele visit, and questions whether pt may have ongoing infection requiring IV antibiotics.  Fortunately her workup here is benign.  Xray shows resolution of opacity, and her CBC is normal.  It appears her PNA was successfully treated.  No evidence of cystitis.Marland Kitchen  CMP wnl - no sign of AKI or dehydration.  Delta trop is flat and remarkable.  ECG per my interpretation is nonischemic.   Her husband reports this is her baseline mental state - no new neuro complaints to suggest stroke.  I personally reviewed her labs, prior medical records, and images here as noted above.  Telemetry with NSR.  Additional history provided by husband at bedside.  Clinical Course as of Nov 23 151  Thu Nov 22, 2019  1816  IMPRESSION: No active cardiopulmonary disease. Resolved left lower lobe pneumonia.   [MT]  1817 WBC 6.8, no leftward shift to suggest ongoing infection.   [MT]  1938 Awaiting UA   [MT]  2005 UA shows no clear evidence of an infection.  The patient is otherwise feeling well.  She remained stable on room air.  I discussed the work-up with the patient and her husband.  Have a very low suspicion for sepsis or continued pneumonia with no consolidations on x-ray and no leukocytosis or fever.  The do not need admission for IV antibiotics.  I also do not suspect she is clinically dehydrated based on her exam, UA findings, and CMP, which show normal electrolytes and normal kidney function.  I think it is reasonable to discharge her home.  Both she and her husband are very happy to hear this.   [MT]  2019 [place] flat   [MT]    Clinical Course User Index [MT]  Chinyere Galiano, Carola Rhine, MD    Final Clinical Impression(s) / ED Diagnoses Final diagnoses:  Weakness    Rx / DC Orders ED Discharge Orders    None       Shaneta Cervenka, Carola Rhine, MD 11/23/19 [phone]

## 2019-11-22 NOTE — Telephone Encounter (Signed)
Okay sounds good, I did vaguely hear about what was going on and they did recommend for her to go to the hospital.

## 2019-11-22 NOTE — ED Triage Notes (Signed)
Pt c/o generalized weakness and dx of PNU from PCP.

## 2019-11-22 NOTE — ED Notes (Signed)
Pt. With xray.

## 2019-11-22 NOTE — Telephone Encounter (Signed)
Video visit was done yesterday

## 2019-11-22 NOTE — Discharge Instructions (Signed)
Your work-up in the ER today was very reassuring.  Specifically, your x-ray showed that your pneumonia cleared up.  There is no sign of infection or dehydration in your blood work or urine.  I felt like it was reasonable to discharge you home.  Please follow-up with your primary care doctor in 3 days if you are continuing to have symptoms or weakness.  Your doctor should be able to review our work-up and testing today.  If you develop new or concerning symptoms including chest pain, difficulty breathing, lightheadedness, you should return to the emergency department.

## 2019-12-01 ENCOUNTER — Other Ambulatory Visit: Payer: Self-pay | Admitting: Family Medicine

## 2019-12-01 DIAGNOSIS — M5030 Other cervical disc degeneration, unspecified cervical region: Secondary | ICD-10-CM

## 2019-12-05 ENCOUNTER — Encounter: Payer: Self-pay | Admitting: Family Medicine

## 2019-12-05 ENCOUNTER — Ambulatory Visit (INDEPENDENT_AMBULATORY_CARE_PROVIDER_SITE_OTHER): Payer: Medicare HMO | Admitting: Family Medicine

## 2019-12-05 DIAGNOSIS — J329 Chronic sinusitis, unspecified: Secondary | ICD-10-CM | POA: Diagnosis not present

## 2019-12-05 DIAGNOSIS — J40 Bronchitis, not specified as acute or chronic: Secondary | ICD-10-CM | POA: Diagnosis not present

## 2019-12-05 MED ORDER — AMOXICILLIN-POT CLAVULANATE 875-125 MG PO TABS: 1.0000 | ORAL_TABLET | Freq: Two times a day (BID) | ORAL | 0 refills | Status: DC

## 2019-12-05 MED ORDER — CHERATUSSIN AC 100-10 MG/5ML PO SOLN: 5.0000 mL | ORAL | 0 refills | Status: DC | PRN

## 2019-12-05 NOTE — Progress Notes (Signed)
Subjective:    Patient ID: Susan Patel, female    DOB: 16-Aug-1940, 79 y.o.   MRN: [id number]   HPI: Susan Patel is a 79 y.o. female presenting for coughing and wheezing. Sneezing. No fever. Nonproductive cough. Onset 2 weeks. Feels dyspneic.   Depression screen [facility] 2/9 11/13/2019 07/26/2019 01/25/2019 11/13/2018 07/31/2018  Decreased Interest 0 0 0 0 0  Down, Depressed, Hopeless 0 0 0 0 0  PHQ - 2 Score 0 0 0 0 0  Altered sleeping - - - - -  Tired, decreased energy - - - - -  Change in appetite - - - - -  Feeling bad or failure about yourself  - - - - -  Trouble concentrating - - - - -  Moving slowly or fidgety/restless - - - - -  Suicidal thoughts - - - - -  PHQ-9 Score - - - - -  Difficult doing work/chores - - - - -  Some recent data might be hidden     Relevant past medical, surgical, family and social history reviewed and updated as indicated.  Interim medical history since our last visit reviewed. Allergies and medications reviewed and updated.  ROS:  Review of Systems  Constitutional: Negative.   HENT: Negative.   Eyes: Negative for visual disturbance.  Respiratory: Positive for cough, shortness of breath and wheezing.   Cardiovascular: Negative for chest pain.  Gastrointestinal: Negative for abdominal pain.  Musculoskeletal: Negative for arthralgias.     Social History   Tobacco Use  Smoking Status Never Smoker  Smokeless Tobacco Never Used       Objective:     Wt Readings from Last 3 Encounters:  11/22/19 179 lb 9.6 oz (81.5 kg)  11/13/19 179 lb 9.6 oz (81.5 kg)  07/26/19 184 lb (83.5 kg)     Exam deferred. Pt. Harboring due to COVID 19. Phone visit performed.   Assessment & Plan:   1. Sinobronchitis     Meds ordered this encounter  Medications  . amoxicillin-clavulanate (AUGMENTIN) 875-125 MG tablet    Sig: Take 1 tablet by mouth 2 (two) times daily. Take all of this medication    Dispense:  20 tablet    Refill:  0  .  guaiFENesin-codeine (CHERATUSSIN AC) 100-10 MG/5ML syrup    Sig: Take 5 mLs by mouth every 4 (four) hours as needed for cough.    Dispense:  180 mL    Refill:  0    No orders of the defined types were placed in this encounter.     Diagnoses and all orders for this visit:  Sinobronchitis  Other orders -     amoxicillin-clavulanate (AUGMENTIN) 875-125 MG tablet; Take 1 tablet by mouth 2 (two) times daily. Take all of this medication -     guaiFENesin-codeine (CHERATUSSIN AC) 100-10 MG/5ML syrup; Take 5 mLs by mouth every 4 (four) hours as needed for cough.    Virtual Visit via telephone Note  I discussed the limitations, risks, security and privacy concerns of performing an evaluation and management service by telephone and the availability of in person appointments. The patient was identified with two identifiers. Pt.expressed understanding and agreed to proceed. Pt. Is at home. Dr. Livia Snellen is in his office.  Follow Up Instructions:   I discussed the assessment and treatment plan with the patient. The patient was provided an opportunity to ask questions and all were answered. The patient agreed with the plan and demonstrated an understanding  of the instructions.   The patient was advised to call back or seek an in-person evaluation if the symptoms worsen or if the condition fails to improve as anticipated.   Total minutes including chart review and phone contact time: 11   Follow up plan: No follow-ups on file.  Claretta Fraise, MD [place]

## 2019-12-14 ENCOUNTER — Other Ambulatory Visit: Payer: Self-pay | Admitting: Family Medicine

## 2019-12-17 ENCOUNTER — Other Ambulatory Visit: Payer: Self-pay

## 2019-12-17 ENCOUNTER — Ambulatory Visit (INDEPENDENT_AMBULATORY_CARE_PROVIDER_SITE_OTHER): Payer: Medicare HMO | Admitting: Family Medicine

## 2019-12-17 ENCOUNTER — Telehealth: Payer: Self-pay | Admitting: Family Medicine

## 2019-12-17 ENCOUNTER — Encounter: Payer: Self-pay | Admitting: Family Medicine

## 2019-12-17 VITALS — BP 144/68 | HR 72 | Temp 97.5°F | Ht 62.0 in | Wt 187.0 lb

## 2019-12-17 DIAGNOSIS — M961 Postlaminectomy syndrome, not elsewhere classified: Secondary | ICD-10-CM | POA: Diagnosis not present

## 2019-12-17 DIAGNOSIS — M47812 Spondylosis without myelopathy or radiculopathy, cervical region: Secondary | ICD-10-CM | POA: Diagnosis not present

## 2019-12-17 DIAGNOSIS — R05 Cough: Secondary | ICD-10-CM

## 2019-12-17 DIAGNOSIS — K2100 Gastro-esophageal reflux disease with esophagitis, without bleeding: Secondary | ICD-10-CM | POA: Diagnosis not present

## 2019-12-17 DIAGNOSIS — R059 Cough, unspecified: Secondary | ICD-10-CM

## 2019-12-17 DIAGNOSIS — R0989 Other specified symptoms and signs involving the circulatory and respiratory systems: Secondary | ICD-10-CM

## 2019-12-17 DIAGNOSIS — I10 Essential (primary) hypertension: Secondary | ICD-10-CM | POA: Diagnosis not present

## 2019-12-17 DIAGNOSIS — Z6834 Body mass index (BMI) 34.0-34.9, adult: Secondary | ICD-10-CM | POA: Diagnosis not present

## 2019-12-17 DIAGNOSIS — R053 Chronic cough: Secondary | ICD-10-CM

## 2019-12-17 MED ORDER — FAMOTIDINE 10 MG PO TABS: 10.0000 mg | ORAL_TABLET | Freq: Two times a day (BID) | ORAL | 2 refills | Status: DC | PRN

## 2019-12-17 MED ORDER — CHERATUSSIN AC 100-10 MG/5ML PO SOLN: 5.0000 mL | ORAL | 0 refills | Status: DC | PRN

## 2019-12-17 MED ORDER — FLUTICASONE PROPIONATE 50 MCG/ACT NA SUSP: 1.0000 | Freq: Two times a day (BID) | NASAL | 2 refills | Status: DC | PRN

## 2019-12-17 NOTE — Telephone Encounter (Signed)
Spoke to pharmacy and rx is ready to be picked up-husband aware.

## 2019-12-17 NOTE — Progress Notes (Signed)
BP (!) 144/68   Pulse 72   Temp (!) 97.5 F (36.4 C)   Ht 5\' 2"  (1.575 m)   Wt 187 lb (84.8 kg)   SpO2 95%   BMI 34.20 kg/m    Subjective:   Patient ID: Susan Patel, female    DOB: 09-29-1940, 79 y.o.   MRN: [id number]  HPI: Susan Patel is a 79 y.o. female presenting on 12/17/2019 for No chief complaint on file.   HPI Patient has been having persistent cough and hacking, she has been having this for the past month, they diagnosed her initially with pneumonia. Patient has been worse at night and not improving. She has been using her inhalers and had 2 rounds of antibiotics which did not seem to help, the Hycodan cough syrup did help. She does states she is producing phlegm along with the coughing. Her husband also notes that she has been having some food stick and get caught as it is going down. They had run out of the famotidine so we will restart that and do a gastroenterology referral. He denies her having any shortness of breath or fevers or chills or body aches or any other symptoms.  Relevant past medical, surgical, family and social history reviewed and updated as indicated. Interim medical history since our last visit reviewed. Allergies and medications reviewed and updated.  Review of Systems  Constitutional: Negative for chills and fever.  HENT: Positive for congestion and trouble swallowing. Negative for ear discharge, ear pain, sinus pressure, sinus pain and sore throat.   Eyes: Negative for redness and visual disturbance.  Respiratory: Positive for cough. Negative for chest tightness and shortness of breath.   Cardiovascular: Negative for chest pain, palpitations and leg swelling.  Genitourinary: Negative for difficulty urinating and dysuria.  Musculoskeletal: Negative for back pain and gait problem.  Skin: Negative for rash.  Neurological: Negative for light-headedness and headaches.  Psychiatric/Behavioral: Negative for agitation and behavioral problems.  All  other systems reviewed and are negative.   Per HPI unless specifically indicated above   Allergies as of 12/17/2019      Reactions   Azithromycin Other (See Comments)   Renal failure   Prednisone Other (See Comments)   Irritability and insomnia   Sulfamethoxazole-trimethoprim Other (See Comments)   Renal failure   Amlodipine Swelling   Caused swelling in legs   Ciprofloxacin Itching, Rash   Tape Rash      Medication List       Accurate as of December 17, 2019 12:26 PM. If you have any questions, ask your nurse or doctor.        STOP taking these medications   amoxicillin-clavulanate 875-125 MG tablet Commonly known as: AUGMENTIN Stopped by: Worthy Rancher, MD   clopidogrel 75 MG tablet Commonly known as: PLAVIX Stopped by: Fransisca Kaufmann Denean Pavon, MD   doxycycline 100 MG tablet Commonly known as: VIBRA-TABS Stopped by: Worthy Rancher, MD   HYDROcodone-homatropine 5-1.5 MG/5ML syrup Commonly known as: HYCODAN Stopped by: Fransisca Kaufmann Waynette Towers, MD   methylPREDNISolone 4 MG Tbpk tablet Commonly known as: MEDROL DOSEPAK Stopped by: Fransisca Kaufmann Skyra Crichlow, MD     TAKE these medications   acetaminophen 325 MG tablet Commonly known as: Tylenol Take 2 tablets (650 mg total) by mouth every 6 (six) hours as needed.   albuterol 108 (90 Base) MCG/ACT inhaler Commonly known as: VENTOLIN HFA Inhale 1-2 puffs into the lungs every 6 (six) hours as needed for wheezing or shortness  of breath.   aspirin 81 MG EC tablet TAKE 1 TABLET (81 MG TOTAL) BY MOUTH DAILY FOR 90 DAYS.   B-D UF III MINI PEN NEEDLES 31G X 5 MM Misc Generic drug: Insulin Pen Needle 1 each by Does not apply route daily.   benzonatate 100 MG capsule Commonly known as: Tessalon Perles Take 1 capsule (100 mg total) by mouth 3 (three) times daily as needed for cough.   Breo Ellipta 200-25 MCG/INH Aepb Generic drug: fluticasone furoate-vilanterol INHALE 1 PUFF BY MOUTH EVERY DAY   carvedilol 12.5 MG  tablet Commonly known as: COREG TAKE 1 TABLET (12.5 MG TOTAL) BY MOUTH 2 (TWO) TIMES DAILY. (NEEDS TO BE SEEN BEFORE NEXT REFILL)   Cheratussin AC 100-10 MG/5ML syrup Generic drug: guaiFENesin-codeine Take 5 mLs by mouth every 4 (four) hours as needed for cough.   famotidine 10 MG tablet Commonly known as: PEPCID Take 1 tablet (10 mg total) by mouth 2 (two) times daily as needed for heartburn or indigestion. What changed: when to take this Changed by: Worthy Rancher, MD   fluticasone 50 MCG/ACT nasal spray Commonly known as: FLONASE Place 1 spray into both nostrils 2 (two) times daily as needed for allergies or rhinitis. What changed: See the new instructions. Changed by: Fransisca Kaufmann Jaileen Janelle, MD   glucose blood test strip Use as instructed   isosorbide mononitrate 30 MG 24 hr tablet Commonly known as: IMDUR TAKE 1 TABLET BY MOUTH EVERY DAY   latanoprost 0.005 % ophthalmic solution Commonly known as: XALATAN Place 1 drop into both eyes at bedtime.   Linzess 290 MCG Caps capsule Generic drug: linaclotide TAKE 1 CAPSULE BY MOUTH EVERY DAY BEFORE BREAKFAST   methocarbamol 750 MG tablet Commonly known as: ROBAXIN Take 750 mg by mouth daily as needed.   montelukast 10 MG tablet Commonly known as: SINGULAIR TAKE 1 TABLET BY MOUTH EVERY DAY   nitroGLYCERIN 0.4 MG SL tablet Commonly known as: NITROSTAT PLACE 1 TABLET (0.4 MG TOTAL) UNDER THE TONGUE EVERY 5 (FIVE) MINUTES AS NEEDED.   OneTouch Delica Lancets Fine Misc TEST TWICE DAILY   OXYGEN Inhale 3-4 L into the lungs at bedtime.   rosuvastatin 10 MG tablet Commonly known as: CRESTOR Take 1 tablet (10 mg total) by mouth every evening.   traMADol 50 MG tablet Commonly known as: ULTRAM Take 1 tablet (50 mg total) by mouth every 8 (eight) hours as needed. What changed: when to take this   traZODone 50 MG tablet Commonly known as: DESYREL TAKE 1 TABLET BY MOUTH EVERYDAY AT BEDTIME   Tresiba FlexTouch 100  UNIT/ML FlexTouch Pen Generic drug: insulin degludec Inject 0.1 mLs (10 Units total) into the skin daily. What changed: additional instructions   triamcinolone cream 0.1 % Commonly known as: KENALOG APPLY TO AFFECTED AREA TWICE A DAY   Trulicity 1.61 WR/6.0AV Sopn Generic drug: Dulaglutide INJECT 0.75 MG INTO THE SKIN ONCE A WEEK.   vitamin B-12 1000 MCG tablet Commonly known as: CYANOCOBALAMIN Take 1,000 mcg by mouth daily.        Objective:   BP (!) 144/68   Pulse 72   Temp (!) 97.5 F (36.4 C)   Ht 5\' 2"  (1.575 m)   Wt 187 lb (84.8 kg)   SpO2 95%   BMI 34.20 kg/m   Wt Readings from Last 3 Encounters:  12/17/19 187 lb (84.8 kg)  11/22/19 179 lb 9.6 oz (81.5 kg)  11/13/19 179 lb 9.6 oz (81.5 kg)  Physical Exam Vitals and nursing note reviewed.  Constitutional:      General: She is not in acute distress.    Appearance: She is well-developed. She is not diaphoretic.  Eyes:     Conjunctiva/sclera: Conjunctivae normal.  Cardiovascular:     Rate and Rhythm: Normal rate and regular rhythm.     Heart sounds: Normal heart sounds. No murmur heard.   Pulmonary:     Effort: Pulmonary effort is normal. No respiratory distress.     Breath sounds: Normal breath sounds. No wheezing, rhonchi or rales.  Chest:     Chest wall: No tenderness.  Abdominal:     General: Abdomen is flat. Bowel sounds are normal. There is no distension.     Tenderness: There is no abdominal tenderness.  Musculoskeletal:        General: No tenderness. Normal range of motion.  Skin:    General: Skin is warm and dry.     Findings: No rash.  Neurological:     Mental Status: She is alert and oriented to person, place, and time.     Coordination: Coordination normal.  Psychiatric:        Behavior: Behavior normal.       Assessment & Plan:   Problem List Items Addressed This Visit      Digestive   Gastro-esophageal reflux disease with esophagitis   Relevant Medications   famotidine  (PEPCID) 10 MG tablet   guaiFENesin-codeine (CHERATUSSIN AC) 100-10 MG/5ML syrup   Other Relevant Orders   Ambulatory referral to Gastroenterology    Other Visit Diagnoses    Globus sensation    -  Primary   Relevant Medications   famotidine (PEPCID) 10 MG tablet   guaiFENesin-codeine (CHERATUSSIN AC) 100-10 MG/5ML syrup   Other Relevant Orders   Ambulatory referral to Gastroenterology   Persistent cough for 3 weeks or longer       Relevant Medications   famotidine (PEPCID) 10 MG tablet   guaiFENesin-codeine (CHERATUSSIN AC) 100-10 MG/5ML syrup   Other Relevant Orders   Ambulatory referral to Gastroenterology   Cough       Relevant Medications   fluticasone (FLONASE) 50 MCG/ACT nasal spray   Other Relevant Orders   Ambulatory referral to Gastroenterology      Restarted famotidine and did referral to gastroenterology, also gave some codeine cough syrup again, it sounds like possibly not respiratory but possibly related to esophageal disorder or problems. Patient also having trouble swallowing some pills that is been more frequent, had come off of famotidine about a month ago as well. Follow up plan: Return in about 2 months (around 02/16/2020), or if symptoms worsen or fail to improve, for Follow-up GERD and cough and chronic medical issues.  Counseling provided for all of the vaccine components Orders Placed This Encounter  Procedures  . Ambulatory referral to Gastroenterology    Caryl Pina, MD [facility] 12/17/2019, 12:26 PM

## 2019-12-17 NOTE — Telephone Encounter (Signed)
Spouse cannot pick up famotidine (PEPCID) 10 MG tablet please pharmacy says to early. Pt was seen today and should be new rx for acid reflux. Please call back

## 2019-12-28 ENCOUNTER — Encounter: Payer: Self-pay | Admitting: Internal Medicine

## 2020-01-28 ENCOUNTER — Other Ambulatory Visit: Payer: Self-pay

## 2020-01-28 ENCOUNTER — Ambulatory Visit (INDEPENDENT_AMBULATORY_CARE_PROVIDER_SITE_OTHER): Payer: Medicare HMO | Admitting: Nurse Practitioner

## 2020-01-28 DIAGNOSIS — J209 Acute bronchitis, unspecified: Secondary | ICD-10-CM | POA: Diagnosis not present

## 2020-01-28 DIAGNOSIS — J440 Chronic obstructive pulmonary disease with acute lower respiratory infection: Secondary | ICD-10-CM | POA: Diagnosis not present

## 2020-01-28 MED ORDER — DOXYCYCLINE HYCLATE 100 MG PO TABS: 100.0000 mg | ORAL_TABLET | Freq: Two times a day (BID) | ORAL | 0 refills | Status: DC

## 2020-01-28 MED ORDER — PREDNISONE 20 MG PO TABS: ORAL_TABLET | ORAL | 0 refills | Status: DC

## 2020-01-28 NOTE — Progress Notes (Signed)
Virtual Visit via telephone Note Due to COVID-19 pandemic this visit was conducted virtually. This visit type was conducted due to national recommendations for restrictions regarding the COVID-19 Pandemic (e.g. social distancing, sheltering in place) in an effort to limit this patient's exposure and mitigate transmission in our community. All issues noted in this document were discussed and addressed.  A physical exam was not performed with this format.  I connected with Susan Patel on 01/28/20 at 11:25 by telephone and verified that I am speaking with the correct person using two identifiers. Susan Patel is currently located at home and no one is currently with her during visit. The provider, Mary-Margaret Hassell Done, FNP is located in their office at time of visit.  I discussed the limitations, risks, security and privacy concerns of performing an evaluation and management service by telephone and the availability of in person appointments. I also discussed with the patient that there may be a patient responsible charge related to this service. The patient expressed understanding and agreed to proceed.   History and Present Illness:   Chief Complaint: Bronchitis   HPI Patient calls in c/o cough that started 1 week ago. She has been taking OTC meds with no relief. She has had history of stroke and I am  not able to understand much of what she is saying. Will call back when husband is available.  * finally reached patients husband. He says she has been wheezing and coughing. No SOB. Is on BREO.  ROS   Observations/Objective: Alert and oriented- answers all questions appropriately No distress Dry cough noted during visit  * had to speak with husband   Assessment and Plan: Susan Patel in today with chief complaint of Bronchitis   1. Acute bronchitis with COPD ([place]) 1. Take meds as prescribed 2. Use a cool mist humidifier especially during the winter months and when heat has  been humid. 3. Use saline nose sprays frequently 4. Saline irrigations of the nose can be very helpful if done frequently.  * 4X daily for 1 week*  * Use of a nettie pot can be helpful with this. Follow directions with this* 5. Drink plenty of fluids 6. Keep thermostat turn down low 7.For any cough or congestion  Use plain Mucinex- regular strength or max strength is fine   * Children- consult with Pharmacist for dosing 8. For fever or aces or pains- take tylenol or ibuprofen appropriate for age and weight.  * for fevers greater than 101 orally you may alternate ibuprofen and tylenol every  3 hours.    - predniSONE (DELTASONE) 20 MG tablet; 2 po at sametime daily for 5 days  Dispense: 10 tablet; Refill: 0 - doxycycline (VIBRA-TABS) 100 MG tablet; Take 1 tablet (100 mg total) by mouth 2 (two) times daily. 1 po bid  Dispense: 20 tablet; Refill: 0     Follow Up Instructions: prn    I discussed the assessment and treatment plan with the patient. The patient was provided an opportunity to ask questions and all were answered. The patient agreed with the plan and demonstrated an understanding of the instructions.   The patient was advised to call back or seek an in-person evaluation if the symptoms worsen or if the condition fails to improve as anticipated.  The above assessment and management plan was discussed with the patient. The patient verbalized understanding of and has agreed to the management plan. Patient is aware to call the clinic if symptoms persist  or worsen. Patient is aware when to return to the clinic for a follow-up visit. Patient educated on when it is appropriate to go to the emergency department.   Time call ended:  11:45  I provided 20 minutes of non-face-to-face time during this encounter.    Mary-Margaret Hassell Done, FNP

## 2020-02-03 ENCOUNTER — Other Ambulatory Visit: Payer: Self-pay | Admitting: Family Medicine

## 2020-02-04 ENCOUNTER — Other Ambulatory Visit: Payer: Self-pay | Admitting: Family Medicine

## 2020-02-17 ENCOUNTER — Encounter: Payer: Self-pay | Admitting: Gastroenterology

## 2020-02-17 NOTE — Progress Notes (Deleted)
Referring Provider: Dettinger, Fransisca Kaufmann, MD Primary Care Physician:  Dettinger, Fransisca Kaufmann, MD Primary Gastroenterologist:  Dr. Rayne Du chief complaint on file.   HPI:   Susan Patel is a 79 y.o. female presenting today at the request of Dettinger, Fransisca Kaufmann, MD for globus sensation, persistent cough, and GERD.  Reviewed relevant PCP office visit dated 12/17/2019 when patient presented for the same problems.  She reported persistent cough and hacking x1 month.  Previously had been diagnosed with pneumonia.  Symptoms are worse at night and were not improving.  She is using inhalers and had 2 rounds of antibiotics with no help.  Hycodan cough syrup was not helpful.  Cough was productive of phlegm.  Husband reported patient was having food stick and difficult as it was going down.  She had arrived famotidine.  Plans to resume famotidine 10 mg and refer to GI.  She was also given codeine cough syrup.   Not sure patient is a good candidate for EGD.  Could consider BPE.  Past Medical History:  Diagnosis Date   Arthritis    'all over"   Asthma    CAD (coronary artery disease)    Chronic pain    COPD (chronic obstructive pulmonary disease) (H[facility])    O2 per [facility] at nights    Depression    Diabetes mellitus without complication ([place])    borderline- , states she was on med., but MD told her "everything is under control so I threw the bottle away"   Fibromyalgia    GERD (gastroesophageal reflux disease)    Hypertension    Neuromuscular disorder ([place])    parkinson, neuropathy- both feet & hands.   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    Past Surgical History:  Procedure Laterality Date   ABDOMINAL HYSTERECTOMY     ANTERIOR CERVICAL DECOMP/DISCECTOMY FUSION N/A 07/10/2015   Procedure: Cervical five-six, Cervical six-seven anterior cervical decompression with fusion plating and bonegraft;  Surgeon: Jovita Gamma, MD;  Location: [facility];  Service: Neurosurgery;  Laterality:  N/A;   APPENDECTOMY     BIOPSY EYE MUSCLE  03/20/2016   biopsy vessel to right eye due to swelling   EYE SURGERY Bilateral    cataracts removed, /w "cyrstal lenses"    LEFT HEART CATH AND CORONARY ANGIOGRAPHY N/A 05/23/2018   Procedure: LEFT HEART CATH AND CORONARY ANGIOGRAPHY;  Surgeon: Troy Sine, MD;  Location: [facility];  Service: Cardiovascular;  Laterality: N/A;   SHOULDER ARTHROSCOPY Right    x2   RCR- spurs removed     Current Outpatient Medications  Medication Sig Dispense Refill   acetaminophen (TYLENOL) 325 MG tablet Take 2 tablets (650 mg total) by mouth every 6 (six) hours as needed. 30 tablet 0   albuterol (PROVENTIL HFA;VENTOLIN HFA) 108 (90 Base) MCG/ACT inhaler Inhale 1-2 puffs into the lungs every 6 (six) hours as needed for wheezing or shortness of breath. 1 Inhaler 1   aspirin 81 MG EC tablet TAKE 1 TABLET (81 MG TOTAL) BY MOUTH DAILY FOR 90 DAYS. 90 tablet 1   benzonatate (TESSALON PERLES) 100 MG capsule Take 1 capsule (100 mg total) by mouth 3 (three) times daily as needed for cough. 20 capsule 0   BREO ELLIPTA 200-25 MCG/INH AEPB INHALE 1 PUFF BY MOUTH EVERY DAY 60 each 4   carvedilol (COREG) 12.5 MG tablet TAKE 1 TABLET (12.5 MG TOTAL) BY MOUTH 2 (TWO) TIMES DAILY. (NEEDS TO BE SEEN BEFORE  NEXT REFILL) 60 tablet 0   doxycycline (VIBRA-TABS) 100 MG tablet Take 1 tablet (100 mg total) by mouth 2 (two) times daily. 1 po bid 20 tablet 0   famotidine (PEPCID) 10 MG tablet Take 1 tablet (10 mg total) by mouth 2 (two) times daily as needed for heartburn or indigestion. 60 tablet 2   fluticasone (FLONASE) 50 MCG/ACT nasal spray Place 1 spray into both nostrils 2 (two) times daily as needed for allergies or rhinitis. 16 g 2   glucose blood test strip Use as instructed 100 each 2   guaiFENesin-codeine (CHERATUSSIN AC) 100-10 MG/5ML syrup Take 5 mLs by mouth every 4 (four) hours as needed for cough. 180 mL 0   insulin degludec (TRESIBA FLEXTOUCH) 100  UNIT/ML FlexTouch Pen Inject 0.1 mLs (10 Units total) into the skin daily. (Patient taking differently: Inject 10 Units into the skin daily. If under 120, not given.) 15 mL 3   Insulin Pen Needle (B-D UF III MINI PEN NEEDLES) 31G X 5 MM MISC 1 each by Does not apply route daily. 90 each 3   isosorbide mononitrate (IMDUR) 30 MG 24 hr tablet TAKE 1 TABLET BY MOUTH EVERY DAY 90 tablet 3   latanoprost (XALATAN) 0.005 % ophthalmic solution Place 1 drop into both eyes at bedtime. 2.5 mL 0   LINZESS 290 MCG CAPS capsule TAKE 1 CAPSULE BY MOUTH EVERY DAY BEFORE BREAKFAST 90 capsule 1   methocarbamol (ROBAXIN) 750 MG tablet Take 750 mg by mouth daily as needed.     montelukast (SINGULAIR) 10 MG tablet TAKE 1 TABLET BY MOUTH EVERY DAY 90 tablet 3   nitroGLYCERIN (NITROSTAT) 0.4 MG SL tablet PLACE 1 TABLET (0.4 MG TOTAL) UNDER THE TONGUE EVERY 5 (FIVE) MINUTES AS NEEDED. 25 tablet 3   ONETOUCH DELICA LANCETS FINE MISC TEST TWICE DAILY 100 each 9   OXYGEN Inhale 3-4 L into the lungs at bedtime.     predniSONE (DELTASONE) 20 MG tablet 2 po at sametime daily for 5 days 10 tablet 0   rosuvastatin (CRESTOR) 10 MG tablet Take 1 tablet (10 mg total) by mouth daily. (Needs to be seen before next refill) 30 tablet 0   traMADol (ULTRAM) 50 MG tablet Take 1 tablet (50 mg total) by mouth every 8 (eight) hours as needed. (Patient taking differently: Take 50 mg by mouth 2 (two) times daily. ) 60 tablet 2   traZODone (DESYREL) 50 MG tablet TAKE 1 TABLET BY MOUTH EVERYDAY AT BEDTIME 90 tablet 3   triamcinolone cream (KENALOG) 0.1 % APPLY TO AFFECTED AREA TWICE A DAY 867 g 2   TRULICITY 6.72 CN/4.7SJ SOPN INJECT 0.75 MG INTO THE SKIN ONCE A WEEK. 6 mL 0   vitamin B-12 (CYANOCOBALAMIN) 1000 MCG tablet Take 1,000 mcg by mouth daily.     No current facility-administered medications for this visit.    Allergies as of 02/18/2020 - Review Complete 01/28/2020  Allergen Reaction Noted   Azithromycin Other (See  Comments) 07/02/2015   Prednisone Other (See Comments) 09/04/2015   Sulfamethoxazole-trimethoprim Other (See Comments) 07/02/2015   Amlodipine Swelling 04/21/2016   Ciprofloxacin Itching and Rash 09/04/2015   Tape Rash 06/27/2015    Family History  Problem Relation Age of Onset   Heart disease Mother    Cancer Father    Heart disease Father    Arthritis Sister    Heart failure Sister    Cancer Brother    Thyroid cancer Son    Atrial fibrillation Son  Arthritis Sister     Social History   Socioeconomic History   Marital status: Married    Spouse name: wayne   Number of children: 2   Years of education: 16   Highest education level: Bachelor's degree (e.g., BA, AB, BS)  Occupational History   Occupation: retired    Comment: Automotive engineer  Tobacco Use   Smoking status: Never Smoker   Smokeless tobacco: Never Used  Scientific laboratory technician Use: Never used  Substance and Sexual Activity   Alcohol use: No   Drug use: No   Sexual activity: Yes  Other Topics Concern   Not on file  Social History Narrative   Not on file   Social Determinants of Health   Financial Resource Strain:    Difficulty of Paying Living Expenses: Not on file  Food Insecurity:    Worried About Charity fundraiser in the Last Year: Not on file   [employer] of Food in the Last Year: Not on file  Transportation Needs:    Lack of Transportation (Medical): Not on file   Lack of Transportation (Non-Medical): Not on file  Physical Activity:    Days of Exercise per Week: Not on file   Minutes of Exercise per Session: Not on file  Stress:    Feeling of Stress : Not on file  Social Connections:    Frequency of Communication with Friends and Family: Not on file   Frequency of Social Gatherings with Friends and Family: Not on file   Attends Religious Services: Not on file   Active Member of Clubs or Organizations: Not on file   Attends Theatre manager Meetings: Not on file   Marital Status: Not on file  Intimate Partner Violence:    Fear of Current or Ex-Partner: Not on file   Emotionally Abused: Not on file   Physically Abused: Not on file   Sexually Abused: Not on file    Review of Systems: Gen: Denies any fever, chills, fatigue, weight loss, lack of appetite.  CV: Denies chest pain, heart palpitations, peripheral edema, syncope.  Resp: Denies shortness of breath at rest or with exertion. Denies wheezing or cough.  GI: Denies dysphagia or odynophagia. Denies jaundice, hematemesis, fecal incontinence. GU : Denies urinary burning, urinary frequency, urinary hesitancy MS: Denies joint pain, muscle weakness, cramps, or limitation of movement.  Derm: Denies rash, itching, dry skin Psych: Denies depression, anxiety, memory loss, and confusion Heme: Denies bruising, bleeding, and enlarged lymph nodes.  Physical Exam: There were no vitals taken for this visit. General:   Alert and oriented. Pleasant and cooperative. Well-nourished and well-developed.  Head:  Normocephalic and atraumatic. Eyes:  Without icterus, sclera clear and conjunctiva pink.  Ears:  Normal auditory acuity. Nose:  No deformity, discharge,  or lesions. Mouth:  No deformity or lesions, oral mucosa pink.  Neck:  Supple, without mass or thyromegaly. Lungs:  Clear to auscultation bilaterally. No wheezes, rales, or rhonchi. No distress.  Heart:  S1, S2 present without murmurs appreciated.  Abdomen:  +BS, soft, non-tender and non-distended. No HSM noted. No guarding or rebound. No masses appreciated.  Rectal:  Deferred  Msk:  Symmetrical without gross deformities. Normal posture. Pulses:  Normal pulses noted. Extremities:  Without clubbing or edema. Neurologic:  Alert and  oriented x4;  grossly normal neurologically. Skin:  Intact without significant lesions or rashes. Cervical Nodes:  No significant cervical adenopathy. Psych:  Alert and  cooperative. Normal mood and  affect.

## 2020-02-18 ENCOUNTER — Encounter: Payer: Self-pay | Admitting: Internal Medicine

## 2020-02-18 ENCOUNTER — Ambulatory Visit: Payer: BC Managed Care – PPO | Admitting: Gastroenterology

## 2020-02-23 ENCOUNTER — Other Ambulatory Visit: Payer: Self-pay | Admitting: Family Medicine

## 2020-02-26 ENCOUNTER — Other Ambulatory Visit: Payer: Self-pay | Admitting: Family Medicine

## 2020-02-26 NOTE — Telephone Encounter (Signed)
Dettinger NTBS 30 days given 02/04/20

## 2020-03-01 ENCOUNTER — Other Ambulatory Visit: Payer: Self-pay | Admitting: Family Medicine

## 2020-03-12 ENCOUNTER — Other Ambulatory Visit: Payer: Self-pay | Admitting: Family Medicine

## 2020-03-12 DIAGNOSIS — K2100 Gastro-esophageal reflux disease with esophagitis, without bleeding: Secondary | ICD-10-CM

## 2020-03-12 DIAGNOSIS — R198 Other specified symptoms and signs involving the digestive system and abdomen: Secondary | ICD-10-CM

## 2020-03-12 DIAGNOSIS — R09A2 Foreign body sensation, throat: Secondary | ICD-10-CM

## 2020-03-12 DIAGNOSIS — R053 Chronic cough: Secondary | ICD-10-CM

## 2020-03-12 DIAGNOSIS — R0989 Other specified symptoms and signs involving the circulatory and respiratory systems: Secondary | ICD-10-CM

## 2020-03-14 ENCOUNTER — Ambulatory Visit (INDEPENDENT_AMBULATORY_CARE_PROVIDER_SITE_OTHER): Payer: Medicare HMO | Admitting: Family

## 2020-03-14 ENCOUNTER — Encounter: Payer: Self-pay | Admitting: Family

## 2020-03-14 ENCOUNTER — Other Ambulatory Visit: Payer: Self-pay

## 2020-03-14 VITALS — BP 181/85 | HR 70 | Temp 97.4°F | Ht 62.0 in | Wt 192.8 lb

## 2020-03-14 DIAGNOSIS — J410 Simple chronic bronchitis: Secondary | ICD-10-CM

## 2020-03-14 DIAGNOSIS — R053 Chronic cough: Secondary | ICD-10-CM

## 2020-03-14 MED ORDER — PREDNISONE 10 MG (21) PO TBPK: ORAL_TABLET | ORAL | 0 refills | Status: DC

## 2020-03-14 MED ORDER — OMEPRAZOLE 20 MG PO CPDR: 20.0000 mg | DELAYED_RELEASE_CAPSULE | Freq: Every day | ORAL | 3 refills | Status: DC

## 2020-03-14 NOTE — Patient Instructions (Signed)

## 2020-03-14 NOTE — Progress Notes (Signed)
Subjective:    Patient ID: Susan Patel, female    DOB: [birth year], 79 y.o.   MRN: [id number]  Chief Complaint  Patient presents with  . Cough    Cough This is a new problem. The current episode started more than 1 month ago. The problem has been gradually worsening. The problem occurs every few minutes. The cough is non-productive. Associated symptoms include headaches, postnasal drip, rhinorrhea, shortness of breath and wheezing. Pertinent negatives include no chills, ear congestion, ear pain, fever, myalgias, nasal congestion or sore throat. The symptoms are aggravated by lying down. She has tried rest and OTC cough suppressant for the symptoms. The treatment provided mild relief.      Review of Systems  Constitutional: Negative for chills and fever.  HENT: Positive for postnasal drip and rhinorrhea. Negative for ear pain and sore throat.   Respiratory: Positive for cough, shortness of breath and wheezing.   Musculoskeletal: Negative for myalgias.  Neurological: Positive for headaches.  All other systems reviewed and are negative.      Objective:   Physical Exam Vitals reviewed.  Constitutional:      General: She is not in acute distress.    Appearance: She is well-developed.  HENT:     Head: Normocephalic and atraumatic.     Nose:     Right Turbinates: Swollen.     Left Turbinates: Swollen.  Eyes:     Pupils: Pupils are equal, round, and reactive to light.  Neck:     Thyroid: No thyromegaly.  Cardiovascular:     Rate and Rhythm: Normal rate and regular rhythm.     Heart sounds: Normal heart sounds. No murmur heard.   Pulmonary:     Effort: Pulmonary effort is normal. No respiratory distress.     Breath sounds: Normal breath sounds. No decreased breath sounds or wheezing.  Abdominal:     General: Bowel sounds are normal. There is no distension.     Palpations: Abdomen is soft.     Tenderness: There is no abdominal tenderness.  Musculoskeletal:         General: No tenderness. Normal range of motion.     Cervical back: Normal range of motion and neck supple.  Skin:    General: Skin is warm and dry.  Neurological:     Mental Status: She is alert and oriented to person, place, and time.     Cranial Nerves: No cranial nerve deficit.     Deep Tendon Reflexes: Reflexes are normal and symmetric.  Psychiatric:        Behavior: Behavior normal.        Thought Content: Thought content normal.        Judgment: Judgment normal.       BP (!) 181/85   Pulse 70   Temp (!) 97.4 F (36.3 C) (Temporal)   Ht 5\' 2"  (1.575 m)   Wt 192 lb 12.8 oz (87.5 kg)   SpO2 97%   BMI 35.26 kg/m      Assessment & Plan:  Susan Patel comes in today with chief complaint of Cough   Diagnosis and orders addressed:  1. Chronic cough - omeprazole (PRILOSEC) 20 MG capsule; Take 1 capsule (20 mg total) by mouth daily.  Dispense: 30 capsule; Refill: 3 - predniSONE (STERAPRED UNI-PAK 21 TAB) 10 MG (21) TBPK tablet; Use as directed  Dispense: 21 tablet; Refill: 0  2. Simple chronic bronchitis (HCC) - predniSONE (STERAPRED UNI-PAK 21 TAB) 10 MG (21)  TBPK tablet; Use as directed  Dispense: 21 tablet; Refill: 0   Given cough has been on going for several months Will try PPI Will given prednisone dose pack Discuss Pulmonologist referral, wants to hold off on referral at this time.   Evelina Dun, FNP     Evelina Dun, [place]

## 2020-03-24 ENCOUNTER — Other Ambulatory Visit: Payer: Self-pay | Admitting: Family Medicine

## 2020-03-26 DIAGNOSIS — I10 Essential (primary) hypertension: Secondary | ICD-10-CM | POA: Diagnosis not present

## 2020-03-26 DIAGNOSIS — Z6834 Body mass index (BMI) 34.0-34.9, adult: Secondary | ICD-10-CM | POA: Diagnosis not present

## 2020-03-26 DIAGNOSIS — M47812 Spondylosis without myelopathy or radiculopathy, cervical region: Secondary | ICD-10-CM | POA: Diagnosis not present

## 2020-04-07 ENCOUNTER — Ambulatory Visit (INDEPENDENT_AMBULATORY_CARE_PROVIDER_SITE_OTHER): Payer: Medicare HMO | Admitting: Family Medicine

## 2020-04-07 ENCOUNTER — Encounter: Payer: Self-pay | Admitting: Family Medicine

## 2020-04-07 VITALS — BP 196/92 | HR 74 | Temp 98.1°F

## 2020-04-07 DIAGNOSIS — R059 Cough, unspecified: Secondary | ICD-10-CM

## 2020-04-07 DIAGNOSIS — R053 Chronic cough: Secondary | ICD-10-CM

## 2020-04-07 NOTE — Progress Notes (Signed)
BP (!) 196/92   Pulse 74   Temp 98.1 F (36.7 C)   SpO2 97%    Subjective:   Patient ID: Susan Patel, female    DOB: 08-25-1940, 79 y.o.   MRN: [id number]  HPI: Susan Patel is a 79 y.o. female presenting on 04/07/2020 for URI and Cough   HPI Patient is coming in with complaints of chronic cough that has been going on more than a month, maybe a couple months.  She has tried multiple treatments for possible COPD including steroids and inhalers and antibiotics x2, we have also undergone trying to treat her for possible GERD.  Patient still has this chronic dry cough.  She denies any shortness of breath or wheezing and says the initial sinus issues that she had have improved but still just having the cough.  Relevant past medical, surgical, family and social history reviewed and updated as indicated. Interim medical history since our last visit reviewed. Allergies and medications reviewed and updated.  Review of Systems  Constitutional: Negative for chills and fever.  HENT: Negative for congestion, ear discharge and ear pain.   Eyes: Negative for redness and visual disturbance.  Respiratory: Positive for cough. Negative for chest tightness, shortness of breath and wheezing.   Cardiovascular: Negative for chest pain and leg swelling.  Genitourinary: Negative for difficulty urinating and dysuria.  Musculoskeletal: Negative for back pain and gait problem.  Skin: Negative for rash.  Neurological: Negative for light-headedness and headaches.  Psychiatric/Behavioral: Negative for agitation and behavioral problems.  All other systems reviewed and are negative.   Per HPI unless specifically indicated above   Allergies as of 04/07/2020      Reactions   Azithromycin Other (See Comments)   Renal failure   Prednisone Other (See Comments)   Irritability and insomnia   Sulfamethoxazole-trimethoprim Other (See Comments)   Renal failure   Amlodipine Swelling   Caused swelling in  legs   Ciprofloxacin Itching, Rash   Tape Rash      Medication List       Accurate as of April 07, 2020  4:34 PM. If you have any questions, ask your nurse or doctor.        acetaminophen 325 MG tablet Commonly known as: Tylenol Take 2 tablets (650 mg total) by mouth every 6 (six) hours as needed.   albuterol 108 (90 Base) MCG/ACT inhaler Commonly known as: VENTOLIN HFA Inhale 1-2 puffs into the lungs every 6 (six) hours as needed for wheezing or shortness of breath.   aspirin 81 MG EC tablet TAKE 1 TABLET (81 MG TOTAL) BY MOUTH DAILY FOR 90 DAYS.   B-D UF III MINI PEN NEEDLES 31G X 5 MM Misc Generic drug: Insulin Pen Needle 1 each by Does not apply route daily.   Breo Ellipta 200-25 MCG/INH Aepb Generic drug: fluticasone furoate-vilanterol INHALE 1 PUFF BY MOUTH EVERY DAY   carvedilol 12.5 MG tablet Commonly known as: COREG TAKE 1 TABLET (12.5 MG TOTAL) BY MOUTH 2 (TWO) TIMES DAILY. (NEEDS TO BE SEEN BEFORE NEXT REFILL)   [employer] Acid Controller 10 MG tablet Generic drug: famotidine TAKE 1 TABLET (10 MG TOTAL) BY MOUTH 2 (TWO) TIMES DAILY AS NEEDED FOR HEARTBURN OR INDIGESTION.   fluticasone 50 MCG/ACT nasal spray Commonly known as: FLONASE Place 1 spray into both nostrils 2 (two) times daily as needed for allergies or rhinitis.   glucose blood test strip Use as instructed   isosorbide mononitrate 30 MG 24 hr  tablet Commonly known as: IMDUR TAKE 1 TABLET BY MOUTH EVERY DAY   latanoprost 0.005 % ophthalmic solution Commonly known as: XALATAN Place 1 drop into both eyes at bedtime.   Linzess 290 MCG Caps capsule Generic drug: linaclotide TAKE 1 CAPSULE BY MOUTH EVERY DAY BEFORE BREAKFAST   methocarbamol 750 MG tablet Commonly known as: ROBAXIN Take 750 mg by mouth daily as needed.   montelukast 10 MG tablet Commonly known as: SINGULAIR TAKE 1 TABLET BY MOUTH EVERY DAY   nitroGLYCERIN 0.4 MG SL tablet Commonly known as: NITROSTAT PLACE 1 TABLET (0.4 MG  TOTAL) UNDER THE TONGUE EVERY 5 (FIVE) MINUTES AS NEEDED.   omeprazole 20 MG capsule Commonly known as: PRILOSEC Take 1 capsule (20 mg total) by mouth daily.   OneTouch Delica Lancets Fine Misc TEST TWICE DAILY   OXYGEN Inhale 3-4 L into the lungs at bedtime.   predniSONE 10 MG (21) Tbpk tablet Commonly known as: STERAPRED UNI-PAK 21 TAB Use as directed   rosuvastatin 10 MG tablet Commonly known as: CRESTOR TAKE 1 TABLET (10 MG TOTAL) BY MOUTH DAILY. (NEEDS TO BE SEEN BEFORE NEXT REFILL)   traMADol 50 MG tablet Commonly known as: ULTRAM Take 1 tablet (50 mg total) by mouth every 8 (eight) hours as needed. What changed: when to take this   traZODone 50 MG tablet Commonly known as: DESYREL TAKE 1 TABLET BY MOUTH EVERYDAY AT BEDTIME   Tresiba FlexTouch 100 UNIT/ML FlexTouch Pen Generic drug: insulin degludec Inject 0.1 mLs (10 Units total) into the skin daily. What changed: additional instructions   Trulicity 1.69 CV/8.9FY Sopn Generic drug: Dulaglutide INJECT 0.75 MG INTO THE SKIN ONCE A WEEK.   vitamin B-12 1000 MCG tablet Commonly known as: CYANOCOBALAMIN Take 1,000 mcg by mouth daily.        Objective:   BP (!) 196/92   Pulse 74   Temp 98.1 F (36.7 C)   SpO2 97%   Wt Readings from Last 3 Encounters:  03/14/20 192 lb 12.8 oz (87.5 kg)  12/17/19 187 lb (84.8 kg)  11/22/19 179 lb 9.6 oz (81.5 kg)    Physical Exam Vitals and nursing note reviewed.  Constitutional:      General: She is not in acute distress.    Appearance: She is well-developed. She is not diaphoretic.  HENT:     Mouth/Throat:     Mouth: Mucous membranes are moist.     Pharynx: Oropharynx is clear. No oropharyngeal exudate or posterior oropharyngeal erythema.  Eyes:     Conjunctiva/sclera: Conjunctivae normal.  Cardiovascular:     Rate and Rhythm: Normal rate and regular rhythm.     Heart sounds: Normal heart sounds. No murmur heard.   Pulmonary:     Effort: Pulmonary effort is  normal. No respiratory distress.     Breath sounds: Normal breath sounds. No wheezing, rhonchi or rales.  Musculoskeletal:        General: No tenderness. Normal range of motion.  Skin:    General: Skin is warm and dry.     Findings: No rash.  Neurological:     Mental Status: She is alert and oriented to person, place, and time.     Coordination: Coordination normal.  Psychiatric:        Behavior: Behavior normal.       Assessment & Plan:   Problem List Items Addressed This Visit    None    Visit Diagnoses    Chronic cough    -  Primary   Relevant Orders   Ambulatory referral to Pulmonology   Cough       Relevant Orders   Novel Coronavirus, NAA ([facility]) (Completed)   Ambulatory referral to Pulmonology      Will refer to pulmonology because we have not been able to encounter the cause of a cough, they will do further testing. Follow up plan: Return if symptoms worsen or fail to improve.  Counseling provided for all of the vaccine components No orders of the defined types were placed in this encounter.   Caryl Pina, MD [facility] 04/07/2020, 4:34 PM

## 2020-04-10 LAB — NOVEL CORONAVIRUS, NAA

## 2020-04-11 ENCOUNTER — Other Ambulatory Visit: Payer: Self-pay | Admitting: Family

## 2020-04-11 ENCOUNTER — Other Ambulatory Visit: Payer: Self-pay | Admitting: Family Medicine

## 2020-04-11 DIAGNOSIS — R053 Chronic cough: Secondary | ICD-10-CM

## 2020-04-14 ENCOUNTER — Other Ambulatory Visit: Payer: Self-pay | Admitting: *Deleted

## 2020-04-14 MED ORDER — ISOSORBIDE MONONITRATE ER 30 MG PO TB24: 30.0000 mg | ORAL_TABLET | Freq: Every day | ORAL | 0 refills | Status: DC

## 2020-04-14 NOTE — Telephone Encounter (Signed)
30 day supply given today but pt needs appointment for further refills.

## 2020-04-15 ENCOUNTER — Telehealth: Payer: Self-pay | Admitting: Family Medicine

## 2020-04-20 ENCOUNTER — Other Ambulatory Visit: Payer: Self-pay | Admitting: Family Medicine

## 2020-04-22 ENCOUNTER — Telehealth: Payer: Self-pay

## 2020-04-22 MED ORDER — CARVEDILOL 12.5 MG PO TABS: 12.5000 mg | ORAL_TABLET | Freq: Two times a day (BID) | ORAL | 0 refills | Status: DC

## 2020-04-22 NOTE — Telephone Encounter (Signed)
Rx sent- husband aware

## 2020-04-22 NOTE — Telephone Encounter (Signed)
Pts husband called to schedule pt to see Dr Dettinger for med refill but he doesn't have any openings until 06/02/20 so husband scheduled pt for that day but pt will need another refill on Carvedilol to last her until her appt.  Please advise and call [place]  [phone]

## 2020-04-22 NOTE — Telephone Encounter (Signed)
Please call patients husband with info on referral that was recently place regarding Chronic cough.  [phone] [name])

## 2020-04-24 ENCOUNTER — Ambulatory Visit (INDEPENDENT_AMBULATORY_CARE_PROVIDER_SITE_OTHER): Payer: Medicare HMO | Admitting: *Deleted

## 2020-04-24 DIAGNOSIS — Z0000 Encounter for general adult medical examination without abnormal findings: Secondary | ICD-10-CM

## 2020-04-24 NOTE — Progress Notes (Signed)
MEDICARE ANNUAL WELLNESS VISIT  04/24/2020  Telephone Visit Disclaimer This Medicare AWV was conducted by telephone due to national recommendations for restrictions regarding the COVID-19 Pandemic (e.g. social distancing).  I verified, using two identifiers, that I am speaking with Susan Patel or their authorized healthcare agent. I discussed the limitations, risks, security, and privacy concerns of performing an evaluation and management service by telephone and the potential availability of an in-person appointment in the future. The patient expressed understanding and agreed to proceed.  Location of Patient: home Location of Provider (nurse): office  Subjective:    Susan Patel is a 79 y.o. female patient of Dettinger, Fransisca Kaufmann, MD who had a Medicare Annual Wellness Visit today via telephone. Susan Patel is Retired and lives with their spouse. she has 2 children. she reports that she is socially active and does interact with friends/family regularly. she is not physically active and enjoys reading.  Patient Care Team: Dettinger, Fransisca Kaufmann, MD as PCP - General (Family Medicine) Herminio Commons, MD (Inactive) as PCP - Cardiology (Cardiology) Daneil Dolin, MD as Consulting Physician (Gastroenterology)  Advanced Directives 04/24/2020 11/22/2019 01/25/2019 08/20/2018 08/20/2018 02/23/2018 08/17/2017  Does Patient Have a Medical Advance Directive? No No No No No No (No Data)  Does patient want to make changes to medical advance directive? - - - - - - -  Would patient like information on creating a medical advance directive? No - Patient declined No - Patient declined No - Patient declined No - Patient declined No - Guardian declined - -    Hospital Utilization Over the Past 12 Months: # of hospitalizations or ER visits: 0 # of surgeries: 0  Review of Systems    Patient reports that her overall health is unchanged compared to last year.  History obtained from chart review and the  patient  Patient Reported Readings (BP, Pulse, CBG, Weight, etc) CBG 120  Pain Assessment Pain : No/denies pain     Current Medications & Allergies (verified) Allergies as of 04/24/2020      Reactions   Azithromycin Other (See Comments)   Renal failure   Prednisone Other (See Comments)   Irritability and insomnia   Sulfamethoxazole-trimethoprim Other (See Comments)   Renal failure   Amlodipine Swelling   Caused swelling in legs   Ciprofloxacin Itching, Rash   Tape Rash      Medication List       Accurate as of April 24, 2020  2:16 PM. If you have any questions, ask your nurse or doctor.        STOP taking these medications   latanoprost 0.005 % ophthalmic solution Commonly known as: XALATAN   predniSONE 10 MG (21) Tbpk tablet Commonly known as: STERAPRED UNI-PAK 21 TAB     TAKE these medications   acetaminophen 325 MG tablet Commonly known as: Tylenol Take 2 tablets (650 mg total) by mouth every 6 (six) hours as needed.   albuterol 108 (90 Base) MCG/ACT inhaler Commonly known as: VENTOLIN HFA Inhale 1-2 puffs into the lungs every 6 (six) hours as needed for wheezing or shortness of breath.   aspirin 81 MG EC tablet TAKE 1 TABLET (81 MG TOTAL) BY MOUTH DAILY FOR 90 DAYS.   B-D UF III MINI PEN NEEDLES 31G X 5 MM Misc Generic drug: Insulin Pen Needle 1 each by Does not apply route daily.   Breo Ellipta 200-25 MCG/INH Aepb Generic drug: fluticasone furoate-vilanterol INHALE 1 PUFF BY MOUTH EVERY  DAY   carvedilol 12.5 MG tablet Commonly known as: COREG Take 1 tablet (12.5 mg total) by mouth 2 (two) times daily. (Needs to be seen before next refill)   [employer] Acid Controller 10 MG tablet Generic drug: famotidine TAKE 1 TABLET (10 MG TOTAL) BY MOUTH 2 (TWO) TIMES DAILY AS NEEDED FOR HEARTBURN OR INDIGESTION.   fluticasone 50 MCG/ACT nasal spray Commonly known as: FLONASE Place 1 spray into both nostrils 2 (two) times daily as needed for allergies or  rhinitis.   glucose blood test strip Use as instructed   isosorbide mononitrate 30 MG 24 hr tablet Commonly known as: IMDUR Take 1 tablet (30 mg total) by mouth daily.   Linzess 290 MCG Caps capsule Generic drug: linaclotide TAKE 1 CAPSULE BY MOUTH EVERY DAY BEFORE BREAKFAST   methocarbamol 750 MG tablet Commonly known as: ROBAXIN Take 750 mg by mouth daily as needed.   montelukast 10 MG tablet Commonly known as: SINGULAIR TAKE 1 TABLET BY MOUTH EVERY DAY   nitroGLYCERIN 0.4 MG SL tablet Commonly known as: NITROSTAT PLACE 1 TABLET (0.4 MG TOTAL) UNDER THE TONGUE EVERY 5 (FIVE) MINUTES AS NEEDED.   omeprazole 20 MG capsule Commonly known as: PRILOSEC TAKE 1 CAPSULE BY MOUTH EVERY DAY   OneTouch Delica Lancets Fine Misc TEST TWICE DAILY   OXYGEN Inhale 3-4 L into the lungs at bedtime.   rosuvastatin 10 MG tablet Commonly known as: CRESTOR TAKE 1 TABLET (10 MG TOTAL) BY MOUTH DAILY. (NEEDS TO BE SEEN BEFORE NEXT REFILL)   traMADol 50 MG tablet Commonly known as: ULTRAM Take 1 tablet (50 mg total) by mouth every 8 (eight) hours as needed. What changed: when to take this   traZODone 50 MG tablet Commonly known as: DESYREL TAKE 1 TABLET BY MOUTH EVERYDAY AT BEDTIME   Tresiba FlexTouch 100 UNIT/ML FlexTouch Pen Generic drug: insulin degludec Inject 0.1 mLs (10 Units total) into the skin daily. What changed: additional instructions   Trulicity 7.42 VZ/5.6LO Sopn Generic drug: Dulaglutide INJECT 0.75 MG INTO THE SKIN ONCE A WEEK.   vitamin B-12 1000 MCG tablet Commonly known as: CYANOCOBALAMIN Take 1,000 mcg by mouth daily.       History (reviewed): Past Medical History:  Diagnosis Date  . Arthritis    'all over"  . Asthma   . CAD (coronary artery disease)    Multivessel CAD, not a candidate for CABG, managing medically.  . Chronic pain   . COPD (chronic obstructive pulmonary disease) (HCC)    O2 per [place] at nights   . Depression   . Diabetes mellitus  without complication ([place])    borderline- , states she was on med., but MD told her "everything is under control so I threw the bottle away"  . Fibromyalgia   . GERD (gastroesophageal reflux disease)   . Hypertension   . Neuromuscular disorder (HCC)    parkinson, neuropathy- both feet & hands.  . 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 ABDOMINAL HYSTERECTOMY    . ANTERIOR CERVICAL DECOMP/DISCECTOMY FUSION N/A 07/10/2015   Procedure: Cervical five-six, Cervical six-seven anterior cervical decompression with fusion plating and bonegraft;  Surgeon: Jovita Gamma, MD;  Location: [place] [facility];  Service: Neurosurgery;  Laterality: N/A;  . APPENDECTOMY    . BIOPSY EYE MUSCLE  03/20/2016   biopsy vessel to right eye due to swelling  . EYE SURGERY Bilateral    cataracts removed, /w "cyrstal lenses"   .  LEFT HEART CATH AND CORONARY ANGIOGRAPHY N/A 05/23/2018   Procedure: LEFT HEART CATH AND CORONARY ANGIOGRAPHY;  Surgeon: Troy Sine, MD;  Location: [place] [facility];  Service: Cardiovascular;  Laterality: N/A;  . SHOULDER ARTHROSCOPY Right    x2   RCR- spurs removed    Family History  Problem Relation Age of Onset  . Heart disease Mother   . Cancer Father   . Heart disease Father   . Arthritis Sister   . Heart failure Sister   . Cancer Brother   . Thyroid cancer Son   . Atrial fibrillation Son   . Arthritis Sister    Social History   Socioeconomic History  . Marital status: Married    Spouse name: wayne  . Number of children: 2  . Years of education: 16  . Highest education level: Bachelor's degree (e.g., BA, AB, BS)  Occupational History  . Occupation: retired    Comment: Automotive engineer  Tobacco Use  . Smoking status: Never Smoker  . Smokeless tobacco: Never Used  Vaping Use  . Vaping Use: Never used  Substance and Sexual Activity  . Alcohol use: No  . Drug use: No  . Sexual activity: Yes  Other Topics  Concern  . Not on file  Social History Narrative  . Not on file   Social Determinants of Health   Financial Resource Strain: Low Risk   . Difficulty of Paying Living Expenses: Not hard at all  Food Insecurity: No Food Insecurity  . Worried About Charity fundraiser in the Last Year: Never true  . Ran Out of Food in the Last Year: Never true  Transportation Needs: No Transportation Needs  . Lack of Transportation (Medical): No  . Lack of Transportation (Non-Medical): No  Physical Activity: Inactive  . Days of Exercise per Week: 0 days  . Minutes of Exercise per Session: 0 min  Stress: No Stress Concern Present  . Feeling of Stress : Only a little  Social Connections: Moderately Integrated  . Frequency of Communication with Friends and Family: More than three times a week  . Frequency of Social Gatherings with Friends and Family: Once a week  . Attends Religious Services: More than 4 times per year  . Active Member of Clubs or Organizations: No  . Attends Archivist Meetings: Never  . Marital Status: Married    Activities of Daily Living In your present state of health, do you have any difficulty performing the following activities: 04/24/2020  Hearing? N  Vision? Y  Difficulty concentrating or making decisions? Y  Comment mild since stroke  Walking or climbing stairs? Y  Comment uses rolling walker  Dressing or bathing? N  Doing errands, shopping? Y  Comment doesn't Physiological scientist and eating ? Y  Comment husband fixes meals  Using the Toilet? N  In the past six months, have you accidently leaked urine? Y  Do you have problems with loss of bowel control? N  Managing your Medications? Y  Comment husband fixes  Managing your Finances? Y  Housekeeping or managing your Housekeeping? Y  Some recent data might be hidden    Patient Education/ Literacy How often do you need to have someone help you when you read instructions, pamphlets, or other written  materials from your doctor or pharmacy?: 1 - Never  Exercise Current Exercise Habits: The patient does not participate in regular exercise at present, Exercise limited by: neurologic condition(s)  Diet Patient  reports consuming 2 meals a day and 1 snack(s) a day Patient reports that her primary diet is: Regular Patient reports that she does have regular access to food.   Depression Screen PHQ 2/9 Scores 04/24/2020 12/17/2019 11/13/2019 07/26/2019 01/25/2019 11/13/2018 07/31/2018  PHQ - 2 Score 0 0 0 0 0 0 0  PHQ- 9 Score - - - - - - -     Fall Risk Fall Risk  04/24/2020 12/17/2019 11/13/2019 11/13/2019 07/26/2019  Falls in the past year? 0 1 1 1  0  Comment - - - - -  Number falls in past yr: 1 1 1 1  -  Injury with Fall? 0 0 0 0 -  Comment - - - - -  Risk Factor Category  - - - - -  Risk for fall due to : Impaired balance/gait History of fall(s);Impaired balance/gait;Impaired mobility History of fall(s);Impaired mobility History of fall(s);Impaired mobility -  Risk for fall due to: Comment - - - - -  Follow up - Falls evaluation completed Education provided - -  Comment - - - - -     Objective:  Susan Patel seemed alert and oriented and she participated appropriately during our telephone visit.  Blood Pressure Weight BMI  BP Readings from Last 3 Encounters:  04/07/20 (!) 196/92  03/14/20 (!) 181/85  12/17/19 (!) 144/68   Wt Readings from Last 3 Encounters:  03/14/20 192 lb 12.8 oz (87.5 kg)  12/17/19 187 lb (84.8 kg)  11/22/19 179 lb 9.6 oz (81.5 kg)   BMI Readings from Last 1 Encounters:  03/14/20 35.26 kg/m    *Unable to obtain current vital signs, weight, and BMI due to telephone visit type  Hearing/Vision  . [place] did not seem to have difficulty with hearing/understanding during the telephone conversation . Reports that she has had a formal eye exam by an eye care professional within the past year . Reports that she has not had a formal hearing evaluation within the past  year *Unable to fully assess hearing and vision during telephone visit type  Cognitive Function: 6CIT Screen 04/24/2020 01/25/2019  What Year? 0 points 0 points  What month? 0 points 0 points  What time? 0 points 0 points  Count back from 20 0 points 0 points  Months in reverse 4 points 0 points  Repeat phrase 0 points 2 points  Total Score 4 2   (Normal:0-7, Significant for Dysfunction: >8)  Normal Cognitive Function Screening: Yes   Immunization & Health Maintenance Record Immunization History  Administered Date(s) Administered  . Influenza Split 02/14/2018  . Influenza, High Dose Seasonal PF 03/23/2017  . Influenza,inj,Quad PF,6+ Mos 03/21/2016  . Influenza-Unspecified 05/01/2012, 06/12/2013, 04/04/2014, 03/17/2018  . Pneumococcal Conjugate-13 04/04/2014  . Pneumococcal Polysaccharide-23 04/25/2006, 03/27/2009  . Tdap 05/31/2011    Health Maintenance  Topic Date Due  . Hepatitis C Screening  Never done  . COVID-19 Vaccine (1) Never done  . URINE MICROALBUMIN  09/03/2016  . HEMOGLOBIN A1C  01/26/2020  . INFLUENZA VACCINE  10/04/2020 (Originally 12/16/2019)  . OPHTHALMOLOGY EXAM  06/26/2020  . FOOT EXAM  07/25/2020  . TETANUS/TDAP  05/30/2021  . DEXA SCAN  Completed  . PNA vac Low Risk Adult  Completed       Assessment  This is a routine wellness examination for Susan Patel.  Health Maintenance: Due or Overdue Health Maintenance Due  Topic Date Due  . Hepatitis C Screening  Never done  . COVID-19 Vaccine (1) Never  done  . URINE MICROALBUMIN  09/03/2016  . HEMOGLOBIN A1C  01/26/2020    Susan Patel does not need a referral for Community Assistance: Care Management:   no Social Work:    no Prescription Assistance:  no Nutrition/Diabetes Education:  no   Plan:  Personalized Goals Goals Addressed            This Visit's Progress   . DIET - EAT MORE FRUITS AND VEGETABLES   On track   . DIET - INCREASE WATER INTAKE   On track   . Exercise 150  min/wk Moderate Activity   Not on track   . Prevent falls        Personalized Health Maintenance & Screening Recommendations  Shingles vaccine  Lung Cancer Screening Recommended: no (Low Dose CT Chest recommended if Age 17-80 years, 30 pack-year currently smoking OR have quit w/in past 15 years) Hepatitis C Screening recommended: no HIV Screening recommended: no  Advanced Directives: Written information was not prepared per patient's request.  Referrals & Orders No orders of the defined types were placed in this encounter.   Follow-up Plan . Follow-up with Dettinger, Fransisca Kaufmann, MD as planned01/17/22. . Offer pt shingles vaccine. . Diabetic eye exam due in Feb 2022 . Pt does ambulate with a rolling walker, she must have some assistance with dressing and preparing food. She does not drive. Her husband is her caretaker. Marland Kitchen Speech is not clear since last stroke in 2020. . Memory good. . No hearing difficulties or vision problems per pt. . AVS printed and mailed to pt.    I have personally reviewed and noted the following in the patient's chart:   . Medical and social history . Use of alcohol, tobacco or illicit drugs  . Current medications and supplements . Functional ability and status . Nutritional status . Physical activity . Advanced directives . List of other physicians . Hospitalizations, surgeries, and ER visits in previous 12 months . Vitals . Screenings to include cognitive, depression, and falls . Referrals and appointments  In addition, I have reviewed and discussed with Susan Patel certain preventive protocols, quality metrics, and best practice recommendations. A written personalized care plan for preventive services as well as general preventive health recommendations is available and can be mailed to the patient at her request.      Rana Snare, LPN  23/11/[id number]

## 2020-05-13 ENCOUNTER — Ambulatory Visit (HOSPITAL_COMMUNITY)
Admission: RE | Admit: 2020-05-13 | Discharge: 2020-05-13 | Disposition: A | Discharge status: Home / Self Care | Payer: Medicare HMO | Source: Ambulatory Visit | Attending: Internal Medicine | Admitting: Internal Medicine

## 2020-05-13 ENCOUNTER — Other Ambulatory Visit (HOSPITAL_COMMUNITY)
Admission: RE | Admit: 2020-05-13 | Discharge: 2020-05-13 | Disposition: A | Discharge status: Home / Self Care | Payer: Medicare HMO | Source: Ambulatory Visit | Attending: Internal Medicine | Admitting: Internal Medicine

## 2020-05-13 ENCOUNTER — Other Ambulatory Visit: Payer: Self-pay | Admitting: Family Medicine

## 2020-05-13 ENCOUNTER — Encounter: Payer: Self-pay | Admitting: Internal Medicine

## 2020-05-13 ENCOUNTER — Ambulatory Visit (INDEPENDENT_AMBULATORY_CARE_PROVIDER_SITE_OTHER): Payer: BC Managed Care – PPO | Admitting: Internal Medicine

## 2020-05-13 ENCOUNTER — Other Ambulatory Visit: Payer: Self-pay

## 2020-05-13 DIAGNOSIS — R058 Other specified cough: Secondary | ICD-10-CM

## 2020-05-13 DIAGNOSIS — R059 Cough, unspecified: Secondary | ICD-10-CM | POA: Diagnosis not present

## 2020-05-13 DIAGNOSIS — I10 Essential (primary) hypertension: Secondary | ICD-10-CM | POA: Diagnosis not present

## 2020-05-13 DIAGNOSIS — G4734 Idiopathic sleep related nonobstructive alveolar hypoventilation: Secondary | ICD-10-CM | POA: Diagnosis not present

## 2020-05-13 LAB — CBC WITH DIFFERENTIAL/PLATELET
Abs Immature Granulocytes: 0.02 10*3/uL (ref 0.00–0.07)
Basophils Absolute: 0.1 10*3/uL (ref 0.0–0.1)
Basophils Relative: 1 %
Eosinophils Absolute: 0.3 10*3/uL (ref 0.0–0.5)
Eosinophils Relative: 4 %
HCT: 41.6 % (ref 36.0–46.0)
Hemoglobin: 13.3 g/dL (ref 12.0–15.0)
Immature Granulocytes: 0 %
Lymphocytes Relative: 23 %
Lymphs Abs: 1.7 10*3/uL (ref 0.7–4.0)
MCH: 29.2 pg (ref 26.0–34.0)
MCHC: 32 g/dL (ref 30.0–36.0)
MCV: 91.4 fL (ref 80.0–100.0)
Monocytes Absolute: 0.8 10*3/uL (ref 0.1–1.0)
Monocytes Relative: 11 %
Neutro Abs: 4.5 10*3/uL (ref 1.7–7.7)
Neutrophils Relative %: 61 %
Platelets: 267 10*3/uL (ref 150–400)
RBC: 4.55 MIL/uL (ref 3.87–5.11)
RDW: 13.8 % (ref 11.5–15.5)
WBC: 7.3 10*3/uL (ref 4.0–10.5)
nRBC: 0 % (ref 0.0–0.2)

## 2020-05-13 MED ORDER — FAMOTIDINE 20 MG PO TABS: ORAL_TABLET | ORAL | 11 refills | Status: DC

## 2020-05-13 MED ORDER — PREDNISONE 10 MG PO TABS: ORAL_TABLET | ORAL | 0 refills | Status: DC

## 2020-05-13 MED ORDER — PANTOPRAZOLE SODIUM 40 MG PO TBEC: 40.0000 mg | DELAYED_RELEASE_TABLET | Freq: Every day | ORAL | 2 refills | Status: DC

## 2020-05-13 NOTE — Assessment & Plan Note (Signed)
Not optimally controlled on present regimen. I reviewed this with the patient and emphasized importance of follow-up with primary care.      In the setting of respiratory symptoms of unknown etiology, rather than titrate up coreg might consider using  bystolic, the most beta -1  selective Beta blocker available in sample form, with bisoprolol the most selective generic choice  on the market, at least on a trial basis, to make sure the spillover Beta 2 effects of the less specific Beta blockers are not contributing to this patient's symptoms.           Each maintenance medication was reviewed in detail including emphasizing most importantly the difference between maintenance and prns and under what circumstances the prns are to be triggered using an action plan format where appropriate.  Total time for H and P, chart review, counseling, reviewing elipta and hfa device use and generating customized AVS unique to this initial office visit / charting  > 60 min

## 2020-05-13 NOTE — Patient Instructions (Addendum)
Stop Breo and omeprazole    AS NEEDED  For drainage / throat tickle try take CHLORPHENIRAMINE  4 mg  (Chlortab 4mg   at should be easiest to find in the green box)  take one every 4 hours as needed - available over the counter- may cause drowsiness so start with just a bedtime dose or two and see how you tolerate it before trying in daytime   Only use your albuterol as a rescue medication to be used if you can't catch your breath by resting or doing a relaxed purse lip breathing pattern.  - The less you use it, the better it will work when you need it. - Ok to use up to 2 puffs  every 4 hours if you must but call for immediate appointment if use goes up over your usual need - Don't leave home without it !!  (think of it like the spare tire for your car)    SHORT TERM Prednisone 10 mg take  4 each am x 2 days,   2 each am x 2 days,  1 each am x 2 days and stop     MAINTENANCE Pantoprazole (protonix) 40 mg   Take  30-60 min before first meal of the day and Pepcid (famotidine)  20 mg one after supper until return to office - this is the best way to tell whether stomach acid is contributing to your problem.    GERD (REFLUX)  is an extremely common cause of respiratory symptoms just like yours , many times with no obvious heartburn at all.    It can be treated with medication, but also with lifestyle changes including elevation of the head of your bed (ideally with 6 -8inch blocks under the headboard of your bed),  Smoking cessation, avoidance of late meals, excessive alcohol, and avoid fatty foods, chocolate, peppermint, colas, red wine, and acidic juices such as orange juice.  NO MINT OR MENTHOL PRODUCTS SO NO COUGH DROPS  USE SUGARLESS CANDY INSTEAD (Jolley ranchers or Stover's or Life Savers) or even ice chips will also do - the key is to swallow to prevent all throat clearing. NO OIL BASED VITAMINS - use powdered substitutes.  Avoid fish oil when coughing.     Try  reduce 02 to 2lpm at bedime and we will arrange for an overnight oxygen test to be sure it's enough.   Please remember to go to the lab and x-ray department at [facility]   for your tests - we will call you with the results when they are available.      Please schedule a follow up office visit in 6 weeks, call sooner if needed with all medications /inhalers/ solutions in hand so we can verify exactly what you are taking. This includes all medications from all doctors and over the counters - PLEASE separate them into two bags:  the ones you take automatically, no matter what, vs the ones you take just when you feel you need them "BAG #2 is UP TO YOU"  - this will really help [facility] help you take your medications more effectively.

## 2020-05-13 NOTE — Assessment & Plan Note (Addendum)
Onset around 2015 worse since summer 2015 on breo and coreg  - Allergy profile 05/13/2020 >  Eos 0. /  IgE  pending  The most common causes of chronic cough in immunocompetent adults include the following: upper airway cough syndrome (UACS), previously referred to as postnasal drip syndrome (PNDS), which is caused by variety of rhinosinus conditions; (2) asthma; (3) GERD; (4) chronic bronchitis from cigarette smoking or other inhaled environmental irritants; (5) nonasthmatic eosinophilic bronchitis; and (6) bronchiectasis (may be at risk from chronic asp s/p multiple CVA's)   These conditions, singly or in combination, have accounted for up to 94% of the causes of chronic cough in prospective studies.   Other conditions have constituted no >6% of the causes in prospective studies These have included bronchogenic carcinoma, chronic interstitial pneumonia, sarcoidosis, left ventricular failure, ACEI-induced cough, and aspiration from a condition associated with pharyngeal dysfunction, and side effects from Beta blockers like coreg, but not usually seen in such low doses.  Chronic cough is often simultaneously caused by more than one condition. A single cause has been found from 38 to 82% of the time, multiple causes from 18 to 62%. Multiple caused cough has been the result of three diseases up to 42% of the time.    Most likely this is Upper airway cough syndrome (previously labeled PNDS),  is so named because it's frequently impossible to sort out how much is  CR/sinusitis with freq throat clearing (which can be related to primary GERD)   vs  causing  secondary (" extra esophageal")  GERD from wide swings in gastric pressure that occur with throat clearing, often  promoting self use of mint and menthol lozenges that reduce the lower esophageal sphincter tone and exacerbate the problem further in a cyclical fashion.   These are the same pts (now being labeled as having "irritable larynx syndrome" by some  cough centers) who not infrequently have a history of having failed to tolerate ace inhibitors,  dry powder inhalers (like advair/ breo) or biphosphonates or report having atypical/extraesophageal reflux symptoms that don't respond to standard doses of PPI  and are easily confused as having aecopd or asthma flares by even experienced allergists/ pulmonologists (myself included).      Rec: Check allergy profile 1st gen H1 blockers per guidelines   Stop breo and just use saba prn for now Max gerd rx/ diet x 6 full weeks  Also added 6 days of Prednisone in case of component of Th-2 driven upper or lower airways inflammation (if cough responds short term only to relapse befor return while will on rx for uacs that would point more to allergic rhinitis/ asthma or eos bronchitis)     Also discussed: The standardized cough guidelines published in Chest by [place] in 2006 are still the best available and consist of a multiple step process (up to 12!) , not a single office visit,  and are intended  to address this problem logically,  with an alogrithm dependent on response to empiric treatment at  each progressive step  to determine a specific diagnosis with  minimal addtional testing needed. Therefore if adherence is an issue or can't be accurately verified,  it's very unlikely the standard evaluation and treatment will be successful here.    Furthermore, response to therapy (other than acute cough suppression, which should only be used short term with avoidance of narcotic containing cough syrups if possible), can be a gradual process for which the patient is not likely to  perceive immediate benefit.  Unlike going to an eye doctor where the best perscription is almost always the first one and is immediately effective, this is almost never the case in the management of chronic cough syndromes. Therefore the patient needs to commit up front to consistently adhere to recommendations  for up to 6 weeks of  therapy directed at the likely underlying problem(s) before the response can be reasonably evaluated so needs to return with all meds in hand using a trust but verify approach to confirm accurate Medication  Reconciliation The principal here is that until we are certain that the  patients are doing what we've asked, it makes no sense to ask them to do more.

## 2020-05-13 NOTE — Progress Notes (Signed)
Susan Patel, female    DOB: 04/07/1941,    MRN: [id number]   Brief patient profile:  66 yowf never smoker with pattern of recurrent "bronchitis" going back to around 2005 spring /fall while living in [place] eval by ENT/allergist pos for weeds/grass never took shots rx with abx/prednsione prn  then advair / breo and singulair added but  worse than usual x summer of 2021 on a background of chronic dysphagia s/p cva with choking on liquids so referred to pulmonary clinic in [facility]  05/13/2020 by Dr  Dettinger.   History of Present Illness  05/13/2020  Pulmonary/ 1st office eval/ Susan Patel / [facility] Office  Chief Complaint  Patient presents with  . Consult    Non productive cough  Dyspnea:  Really not limited by breathing / some balance does shopping including [employer] but not the mall Cough: dry/ sometimes worse p meals Sleep: hob 30 degrees / occ coughs  SABA use: worse p saba / better p prednisone and 1st gen H1 vs new generation rx   No obvious day to day or daytime variability or assoc excess/ purulent sputum or mucus plugs or hemoptysis or cp or chest tightness, subjective wheeze or overt sinus or hb symptoms.     Also denies any obvious fluctuation of symptoms with weather or environmental changes or other aggravating or alleviating factors except as outlined above   No unusual exposure hx or h/o childhood pna/ asthma or knowledge of premature birth.  Current Allergies, Complete Past Medical History, Past Surgical History, Family History, and Social History were reviewed in [employer] record.  ROS  The following are not active complaints unless bolded Hoarseness, sore throat, dysphagia, dental problems, itching, sneezing,  nasal congestion or discharge of excess mucus or purulent secretions, ear ache,   fever, chills, sweats, unintended wt loss or wt gain, classically pleuritic or exertional cp,  orthopnea pnd or arm/hand swelling  or leg swelling,  presyncope, palpitations, abdominal pain, anorexia, nausea, vomiting, diarrhea  or change in bowel habits or change in bladder habits, change in stools or change in urine, dysuria, hematuria,  rash, arthralgias, visual complaints, headache, numbness, weakness or ataxia or problems with walking or coordination s/p cva ,  change in mood or  memory.           Past Medical History:  Diagnosis Date  . Arthritis    'all over"  . Asthma   . CAD (coronary artery disease)    Multivessel CAD, not a candidate for CABG, managing medically.  . Chronic pain   . COPD (chronic obstructive pulmonary disease) (HCC)    O2 per Center at nights   . Depression   . Diabetes mellitus without complication (HCC)    borderline- , states she was on med., but MD told her "everything is under control so I threw the bottle away"  . Fibromyalgia   . GERD (gastroesophageal reflux disease)   . Hypertension   . Neuromuscular disorder (HCC)    parkinson, neuropathy- both feet & hands.  . 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 acetaminophen (TYLENOL) 325 MG tablet Take 2 tablets (650 mg total) by mouth every 6 (six) hours as needed. 30 tablet 0  . albuterol (PROVENTIL HFA;VENTOLIN HFA) 108 (90 Base) MCG/ACT inhaler Inhale 1-2 puffs into the lungs every 6 (six) hours as needed for wheezing or shortness of breath. 1 Inhaler 1  .  aspirin 81 MG EC tablet TAKE 1 TABLET (81 MG TOTAL) BY MOUTH DAILY FOR 90 DAYS. 90 tablet 1  . BREO ELLIPTA 200-25 MCG/INH AEPB INHALE 1 PUFF BY MOUTH EVERY DAY 60 each 4  . carvedilol (COREG) 12.5 MG tablet Take 1 tablet (12.5 mg total) by mouth 2 (two) times daily. (Needs to be seen before next refill) 60 tablet 0  . fluticasone (FLONASE) 50 MCG/ACT nasal spray Place 1 spray into both nostrils 2 (two) times daily as needed for allergies or rhinitis. 16 g 2  . glucose blood test strip Use as instructed 100 each 2  . insulin  degludec (TRESIBA FLEXTOUCH) 100 UNIT/ML FlexTouch Pen Inject 0.1 mLs (10 Units total) into the skin daily. (Patient taking differently: Inject 10 Units into the skin daily. If under 120, not given.) 15 mL 3  . Insulin Pen Needle (B-D UF III MINI PEN NEEDLES) 31G X 5 MM MISC 1 each by Does not apply route daily. 90 each 3  . isosorbide mononitrate (IMDUR) 30 MG 24 hr tablet Take 1 tablet (30 mg total) by mouth daily. 90 tablet 0  . LINZESS 290 MCG CAPS capsule TAKE 1 CAPSULE BY MOUTH EVERY DAY BEFORE BREAKFAST 90 capsule 1  . methocarbamol (ROBAXIN) 750 MG tablet Take 750 mg by mouth daily as needed.    . montelukast (SINGULAIR) 10 MG tablet TAKE 1 TABLET BY MOUTH EVERY DAY 90 tablet 3  . Multiple Vitamin (MULTIVITAMIN) tablet Take 1 tablet by mouth daily.    . nitroGLYCERIN (NITROSTAT) 0.4 MG SL tablet PLACE 1 TABLET (0.4 MG TOTAL) UNDER THE TONGUE EVERY 5 (FIVE) MINUTES AS NEEDED. 25 tablet 3  . omeprazole (PRILOSEC) 20 MG capsule TAKE 1 CAPSULE BY MOUTH EVERY DAY 90 capsule 1  . ONETOUCH DELICA LANCETS FINE MISC TEST TWICE DAILY 100 each 9  . OXYGEN Inhale 3-4 L into the lungs at bedtime.    . rosuvastatin (CRESTOR) 10 MG tablet TAKE 1 TABLET (10 MG TOTAL) BY MOUTH DAILY. (NEEDS TO BE SEEN BEFORE NEXT REFILL) 30 tablet 0  . traZODone (DESYREL) 50 MG tablet TAKE 1 TABLET BY MOUTH EVERYDAY AT BEDTIME 90 tablet 3  . TRULICITY A999333 0000000 SOPN INJECT 0.75 MG INTO THE SKIN ONCE A WEEK. 6 mL 0  . vitamin B-12 (CYANOCOBALAMIN) 1000 MCG tablet Take 1,000 mcg by mouth daily.    Marland Kitchen     0  .     2   No facility-administered medications prior to visit.     Objective:     BP (!) 162/98 (BP Location: Left Arm, Cuff Size: Normal)   Pulse 69   Temp 98.4 F (36.9 C) (Other (Comment)) Comment (Src): wrist  Ht 5\' 1"  (1.549 m)   Wt 196 lb 6.4 oz (89.1 kg)   SpO2 96% Comment: Room air  BMI 37.11 kg/m   SpO2: 96 % (Room air)   Somber amb wf with severe speech impediment "thick tongued" since  cva   HEENT : pt wearing mask not removed for exam due to covid -19 concerns.    NECK :  without JVD/Nodes/TM/ nl carotid upstrokes bilaterally   LUNGS: no acc muscle use,  Nl contour chest which is clear to A and P bilaterally without cough on insp or exp maneuvers   CV:  RRR  no s3 or murmur or increase in P2, and no edema   ABD:  soft and nontender with nl inspiratory excursion in the supine position. No bruits or  organomegaly appreciated, bowel sounds nl  MS: Unsteady gait/ ext warm without deformities, calf tenderness, cyanosis or clubbing No obvious joint restrictions   SKIN: warm and dry without lesions    NEURO:  Alert,  nl sensorium with unsteady slt wide based gait and dysarthria  noted     Labs ordered 05/13/2020  :  allergy profile      CXR PA and Lateral:   05/13/2020 :    I personally reviewed images and agree with radiology impression as follows:    The heart size and mediastinal contours are within normal limits. Both lungs are clear. No visible pleural effusions or pneumothorax. No acute osseous abnormality. Partially imaged cervical ACDF. Mild S-shaped thoracolumbar curvature.    Assessment   Upper airway cough syndrome Onset around 2015 worse since summer 2015 on breo and coreg  - Allergy profile 05/13/2020 >  Eos 0. /  IgE  pending  The most common causes of chronic cough in immunocompetent adults include the following: upper airway cough syndrome (UACS), previously referred to as postnasal drip syndrome (PNDS), which is caused by variety of rhinosinus conditions; (2) asthma; (3) GERD; (4) chronic bronchitis from cigarette smoking or other inhaled environmental irritants; (5) nonasthmatic eosinophilic bronchitis; and (6) bronchiectasis (may be at risk from chronic asp s/p multiple CVA's)   These conditions, singly or in combination, have accounted for up to 94% of the causes of chronic cough in prospective studies.   Other conditions have constituted no  >6% of the causes in prospective studies These have included bronchogenic carcinoma, chronic interstitial pneumonia, sarcoidosis, left ventricular failure, ACEI-induced cough, and aspiration from a condition associated with pharyngeal dysfunction, and side effects from Beta blockers like coreg, but not usually seen in such low doses.  Chronic cough is often simultaneously caused by more than one condition. A single cause has been found from 38 to 82% of the time, multiple causes from 18 to 62%. Multiple caused cough has been the result of three diseases up to 42% of the time.    Most likely this is Upper airway cough syndrome (previously labeled PNDS),  is so named because it's frequently impossible to sort out how much is  CR/sinusitis with freq throat clearing (which can be related to primary GERD)   vs  causing  secondary (" extra esophageal")  GERD from wide swings in gastric pressure that occur with throat clearing, often  promoting self use of mint and menthol lozenges that reduce the lower esophageal sphincter tone and exacerbate the problem further in a cyclical fashion.   These are the same pts (now being labeled as having "irritable larynx syndrome" by some cough centers) who not infrequently have a history of having failed to tolerate ace inhibitors,  dry powder inhalers (like advair/ breo) or biphosphonates or report having atypical/extraesophageal reflux symptoms that don't respond to standard doses of PPI  and are easily confused as having aecopd or asthma flares by even experienced allergists/ pulmonologists (myself included).      Rec: Check allergy profile 1st gen H1 blockers per guidelines   Stop breo and just use saba prn for now Max gerd rx/ diet x 6 full weeks  Also added 6 days of Prednisone in case of component of Th-2 driven upper or lower airways inflammation (if cough responds short term only to relapse befor return while will on rx for uacs that would point to allergic  rhinitis/ asthma or eos bronchitis)  Nocturnal hypoxemia ONO on 2lpm ordered 05/13/2020   Doubt she needs 4lpm at hs given such good daytime function on RA and 96% at rest.  The nasal 02 in fact at this rate esp dry can cause non-specific rhinitis/ pnds with cough so rec repeat ono on just 2lpm to see if this is adequate   Essential hypertension Not optimally controlled on present regimen. I reviewed this with the patient and emphasized importance of follow-up with primary care.      In the setting of respiratory symptoms of unknown etiology, rather than titrate up coreg might consider using  bystolic, the most beta -1  selective Beta blocker available in sample form, with bisoprolol the most selective generic choice  on the market, at least on a trial basis, to make sure the spillover Beta 2 effects of the less specific Beta blockers are not contributing to this patient's symptoms.           Each maintenance medication was reviewed in detail including emphasizing most importantly the difference between maintenance and prns and under what circumstances the prns are to be triggered using an action plan format where appropriate.  Total time for H and P, chart review, counseling, reviewing elipta and hfa device use and generating customized AVS unique to this initial office visit / charting  > 60 min           Christinia Gully, MD 05/13/2020

## 2020-05-13 NOTE — Assessment & Plan Note (Signed)
ONO on 2lpm ordered 05/13/2020   Doubt she needs 4lpm at hs given such good daytime function on RA and 96% at rest.  The nasal 02 in fact at this rate esp dry can cause non-specific rhinitis/ pnds with cough so rec repeat ono on just 2lpm to see if this is adequate

## 2020-05-14 ENCOUNTER — Encounter: Payer: Self-pay | Admitting: Internal Medicine

## 2020-05-14 ENCOUNTER — Encounter: Payer: Self-pay | Admitting: *Deleted

## 2020-05-14 ENCOUNTER — Other Ambulatory Visit: Payer: Self-pay | Admitting: Family Medicine

## 2020-05-14 ENCOUNTER — Telehealth: Payer: Self-pay | Admitting: *Deleted

## 2020-05-14 NOTE — Chronic Care Management (AMB) (Signed)
  Chronic Care Management   Outreach Note  05/14/2020 Name: [name] MRN: [id number] DOB: 21-Aug-1940  [name] is a 79 y.o. year old female who is a primary care patient of Dettinger, Elige Radon, MD. I reached out to Milinda Hirschfeld by phone today in response to a referral sent by Ms. Arturo Morton Cheslock's health plan.     An unsuccessful telephone outreach was attempted today. The patient was referred to the case management team for assistance with care management and care coordination.   Follow Up Plan: A HIPAA compliant phone message was left for the patient providing contact information and requesting a return call. The care management team will reach out to the patient again over the next 7 days. If patient returns call to provider office, please advise to call Embedded Care Management Care Guide Gwenevere Ghazi at [phone].  Gwenevere Ghazi  Care Guide, Embedded Care Coordination [facility] Management  Direct Dial: [phone]

## 2020-05-15 LAB — IGE: IgE (Immunoglobulin E), Serum: 23 IU/mL (ref 6–495)

## 2020-05-22 ENCOUNTER — Other Ambulatory Visit: Payer: Self-pay | Admitting: Family Medicine

## 2020-05-22 ENCOUNTER — Other Ambulatory Visit: Payer: Self-pay | Admitting: Nurse Practitioner

## 2020-05-22 DIAGNOSIS — R198 Other specified symptoms and signs involving the digestive system and abdomen: Secondary | ICD-10-CM

## 2020-05-22 DIAGNOSIS — R0989 Other specified symptoms and signs involving the circulatory and respiratory systems: Secondary | ICD-10-CM

## 2020-05-22 DIAGNOSIS — K2100 Gastro-esophageal reflux disease with esophagitis, without bleeding: Secondary | ICD-10-CM

## 2020-05-22 DIAGNOSIS — R053 Chronic cough: Secondary | ICD-10-CM

## 2020-05-22 DIAGNOSIS — J209 Acute bronchitis, unspecified: Secondary | ICD-10-CM

## 2020-05-22 DIAGNOSIS — J440 Chronic obstructive pulmonary disease with acute lower respiratory infection: Secondary | ICD-10-CM

## 2020-05-23 DIAGNOSIS — G4730 Sleep apnea, unspecified: Secondary | ICD-10-CM | POA: Diagnosis not present

## 2020-05-23 DIAGNOSIS — R0683 Snoring: Secondary | ICD-10-CM | POA: Diagnosis not present

## 2020-05-26 NOTE — Chronic Care Management (AMB) (Signed)
  Chronic Care Management   Outreach Note  05/26/2020 Name: Susan Patel MRN: [id number] DOB: 1940/10/26  Susan Patel is a 80 y.o. year old female who is a primary care patient of Dettinger, Fransisca Kaufmann, MD. I reached out to [name] by phone today in response to a referral sent by Ms. Fransico Michael Steptoe's health plan.     A second unsuccessful telephone outreach was attempted today. The patient was referred to the case management team for assistance with care management and care coordination.   Follow Up Plan: The care management team will reach out to the patient again over the next 7 days. If patient returns call to provider office, please advise to call Yonah at [phone].  [place] Management

## 2020-06-02 ENCOUNTER — Ambulatory Visit (INDEPENDENT_AMBULATORY_CARE_PROVIDER_SITE_OTHER): Payer: Medicare HMO | Admitting: Family Medicine

## 2020-06-02 ENCOUNTER — Encounter: Payer: Self-pay | Admitting: Family Medicine

## 2020-06-02 DIAGNOSIS — K5900 Constipation, unspecified: Secondary | ICD-10-CM | POA: Diagnosis not present

## 2020-06-02 DIAGNOSIS — G4700 Insomnia, unspecified: Secondary | ICD-10-CM | POA: Diagnosis not present

## 2020-06-02 DIAGNOSIS — J3089 Other allergic rhinitis: Secondary | ICD-10-CM | POA: Diagnosis not present

## 2020-06-02 DIAGNOSIS — E1142 Type 2 diabetes mellitus with diabetic polyneuropathy: Secondary | ICD-10-CM

## 2020-06-02 DIAGNOSIS — I10 Essential (primary) hypertension: Secondary | ICD-10-CM

## 2020-06-02 DIAGNOSIS — J4540 Moderate persistent asthma, uncomplicated: Secondary | ICD-10-CM | POA: Diagnosis not present

## 2020-06-02 MED ORDER — CARVEDILOL 12.5 MG PO TABS: 12.5000 mg | ORAL_TABLET | Freq: Two times a day (BID) | ORAL | 3 refills | Status: DC

## 2020-06-02 MED ORDER — METHOCARBAMOL 750 MG PO TABS: 750.0000 mg | ORAL_TABLET | Freq: Every evening | ORAL | 1 refills | Status: DC | PRN

## 2020-06-02 MED ORDER — TRAZODONE HCL 50 MG PO TABS: 50.0000 mg | ORAL_TABLET | Freq: Every evening | ORAL | 3 refills | Status: DC | PRN

## 2020-06-02 MED ORDER — ROSUVASTATIN CALCIUM 10 MG PO TABS: 10.0000 mg | ORAL_TABLET | Freq: Every day | ORAL | 3 refills | Status: DC

## 2020-06-02 MED ORDER — TRULICITY 0.75 MG/0.5ML ~~LOC~~ SOAJ: 0.7500 mg | SUBCUTANEOUS | 3 refills | Status: DC

## 2020-06-02 MED ORDER — ALBUTEROL SULFATE HFA 108 (90 BASE) MCG/ACT IN AERS: 1.0000 | INHALATION_SPRAY | Freq: Four times a day (QID) | RESPIRATORY_TRACT | 1 refills | Status: DC | PRN

## 2020-06-02 MED ORDER — MONTELUKAST SODIUM 10 MG PO TABS: 10.0000 mg | ORAL_TABLET | Freq: Every day | ORAL | 3 refills | Status: DC

## 2020-06-02 MED ORDER — LINACLOTIDE 290 MCG PO CAPS: 290.0000 ug | ORAL_CAPSULE | Freq: Every day | ORAL | 3 refills | Status: DC

## 2020-06-02 MED ORDER — TRESIBA FLEXTOUCH 100 UNIT/ML ~~LOC~~ SOPN: 10.0000 [IU] | PEN_INJECTOR | Freq: Every day | SUBCUTANEOUS | 3 refills | Status: DC

## 2020-06-02 NOTE — Progress Notes (Signed)
Virtual Visit via telephone Note  I connected with Susan Patel on 06/02/20 at 1347 by telephone and verified that I am speaking with the correct person using two identifiers. Susan Patel is currently located at home and husband are currently with her during visit. The provider, Fransisca Kaufmann Raquell Richer, MD is located in their office at time of visit.  Call ended at 1404  I discussed the limitations, risks, security and privacy concerns of performing an evaluation and management service by telephone and the availability of in person appointments. I also discussed with the patient that there may be a patient responsible charge related to this service. The patient expressed understanding and agreed to proceed.   History and Present Illness: Patient still complains of chronic cough, no change.  Husband is not there.   Type 2 diabetes mellitus Patient comes in today for recheck of his diabetes. Patient has been currently taking trulicity and tresiba. Patient is not currently on an ACE inhibitor/ARB. Patient has seen an ophthalmologist this year. Patient denies any issues with their feet. The symptom started onset as an adult none ARE RELATED TO DM   IBS recheck linzess is doing well.  Insomnia recheck Patient is on trazodone and doing ok on it. Denies any issues  She has not had tramadol in 6 months, husband not there yet.   1. Type 2 diabetes mellitus with peripheral neuropathy ([place])   2. Moderate persistent asthma without complication   3. Constipation, unspecified constipation type   4. Allergic rhinitis due to other allergic trigger, unspecified seasonality   5. Insomnia, unspecified type   6. Essential hypertension     Outpatient Encounter Medications as of 06/02/2020  Medication Sig  . acetaminophen (TYLENOL) 325 MG tablet Take 2 tablets (650 mg total) by mouth every 6 (six) hours as needed.  Marland Kitchen albuterol (VENTOLIN HFA) 108 (90 Base) MCG/ACT inhaler Inhale 1-2 puffs into the  lungs every 6 (six) hours as needed for wheezing or shortness of breath.  Marland Kitchen aspirin 81 MG EC tablet TAKE 1 TABLET (81 MG TOTAL) BY MOUTH DAILY FOR 90 DAYS.  Marland Kitchen carvedilol (COREG) 12.5 MG tablet Take 1 tablet (12.5 mg total) by mouth 2 (two) times daily.  . Dulaglutide (TRULICITY) 1.06 YI/9.4WN SOPN Inject 0.75 mg into the skin once a week.  . famotidine (PEPCID) 20 MG tablet One after supper  . fluticasone (FLONASE) 50 MCG/ACT nasal spray Place 1 spray into both nostrils 2 (two) times daily as needed for allergies or rhinitis.  Marland Kitchen glucose blood test strip Use as instructed  . insulin degludec (TRESIBA FLEXTOUCH) 100 UNIT/ML FlexTouch Pen Inject 10 Units into the skin daily.  . Insulin Pen Needle (B-D UF III MINI PEN NEEDLES) 31G X 5 MM MISC 1 each by Does not apply route daily.  . isosorbide mononitrate (IMDUR) 30 MG 24 hr tablet TAKE 1 TABLET BY MOUTH EVERY DAY  . linaclotide (LINZESS) 290 MCG CAPS capsule Take 1 capsule (290 mcg total) by mouth daily before breakfast.  . methocarbamol (ROBAXIN) 750 MG tablet Take 1 tablet (750 mg total) by mouth at bedtime as needed.  . montelukast (SINGULAIR) 10 MG tablet Take 1 tablet (10 mg total) by mouth daily.  . Multiple Vitamin (MULTIVITAMIN) tablet Take 1 tablet by mouth daily.  . nitroGLYCERIN (NITROSTAT) 0.4 MG SL tablet PLACE 1 TABLET (0.4 MG TOTAL) UNDER THE TONGUE EVERY 5 (FIVE) MINUTES AS NEEDED.  Marland Kitchen ONETOUCH DELICA LANCETS FINE MISC TEST TWICE DAILY  .  OXYGEN Inhale 3-4 L into the lungs at bedtime.  . pantoprazole (PROTONIX) 40 MG tablet Take 1 tablet (40 mg total) by mouth daily. Take 30-60 min before first meal of the day  . rosuvastatin (CRESTOR) 10 MG tablet Take 1 tablet (10 mg total) by mouth daily.  . traMADol (ULTRAM) 50 MG tablet Take 1 tablet (50 mg total) by mouth every 8 (eight) hours as needed. (Patient not taking: Reported on 05/13/2020)  . traZODone (DESYREL) 50 MG tablet Take 1 tablet (50 mg total) by mouth at bedtime as needed for  sleep.  . vitamin B-12 (CYANOCOBALAMIN) 1000 MCG tablet Take 1,000 mcg by mouth daily.  . [DISCONTINUED] albuterol (PROVENTIL HFA;VENTOLIN HFA) 108 (90 Base) MCG/ACT inhaler Inhale 1-2 puffs into the lungs every 6 (six) hours as needed for wheezing or shortness of breath.  . [DISCONTINUED] carvedilol (COREG) 12.5 MG tablet Take 1 tablet (12.5 mg total) by mouth 2 (two) times daily.  . [DISCONTINUED] insulin degludec (TRESIBA FLEXTOUCH) 100 UNIT/ML FlexTouch Pen Inject 0.1 mLs (10 Units total) into the skin daily. (Patient taking differently: Inject 10 Units into the skin daily. If under 120, not given.)  . [DISCONTINUED] LINZESS 290 MCG CAPS capsule TAKE 1 CAPSULE BY MOUTH EVERY DAY BEFORE BREAKFAST  . [DISCONTINUED] methocarbamol (ROBAXIN) 750 MG tablet Take 750 mg by mouth daily as needed.  . [DISCONTINUED] montelukast (SINGULAIR) 10 MG tablet TAKE 1 TABLET BY MOUTH EVERY DAY  . [DISCONTINUED] predniSONE (DELTASONE) 10 MG tablet Take  4 each am x 2 days,   2 each am x 2 days,  1 each am x 2 days and stop  . [DISCONTINUED] rosuvastatin (CRESTOR) 10 MG tablet Take 1 tablet (10 mg total) by mouth daily.  . [DISCONTINUED] traZODone (DESYREL) 50 MG tablet TAKE 1 TABLET BY MOUTH EVERYDAY AT BEDTIME  . [DISCONTINUED] TRULICITY 6.04 VW/0.9WJ SOPN INJECT 0.75 MG INTO THE SKIN ONCE A WEEK.   No facility-administered encounter medications on file as of 06/02/2020.    Review of Systems  Constitutional: Negative for chills and fever.  Eyes: Negative for visual disturbance.  Respiratory: Negative for chest tightness and shortness of breath.   Cardiovascular: Negative for chest pain and leg swelling.  Musculoskeletal: Negative for back pain and gait problem.  Skin: Negative for rash.  Neurological: Negative for light-headedness and headaches.  Psychiatric/Behavioral: Negative for agitation and behavioral problems.  All other systems reviewed and are negative.   Observations/Objective: Patient sounds  comfortable and in no acute distress  Assessment and Plan: Problem List Items Addressed This Visit      Cardiovascular and Mediastinum   Essential hypertension   Relevant Medications   carvedilol (COREG) 12.5 MG tablet   rosuvastatin (CRESTOR) 10 MG tablet     Respiratory   Allergic rhinitis   Relevant Medications   montelukast (SINGULAIR) 10 MG tablet     Endocrine   Type 2 diabetes mellitus with peripheral neuropathy (HCC) - Primary   Relevant Medications   insulin degludec (TRESIBA FLEXTOUCH) 100 UNIT/ML FlexTouch Pen   rosuvastatin (CRESTOR) 10 MG tablet   Dulaglutide (TRULICITY) 1.91 YN/8.2NF SOPN   traZODone (DESYREL) 50 MG tablet   methocarbamol (ROBAXIN) 750 MG tablet     Other   Constipation   Relevant Medications   linaclotide (LINZESS) 290 MCG CAPS capsule    Other Visit Diagnoses    Moderate persistent asthma without complication       Relevant Medications   albuterol (VENTOLIN HFA) 108 (90 Base) MCG/ACT inhaler  montelukast (SINGULAIR) 10 MG tablet   Insomnia, unspecified type       Relevant Medications   traZODone (DESYREL) 50 MG tablet    Patient is seeing Dr. Melvyn Novas a pulmonologist to help with the cough syndrome, he thinks it might be upper airway cough syndrome, may be related to nerve damage from her stroke  Follow up plan: Return in about 3 months (around 08/31/2020), or if symptoms worsen or fail to improve, for Recheck diabetes and insomnia and cough.     I discussed the assessment and treatment plan with the patient. The patient was provided an opportunity to ask questions and all were answered. The patient agreed with the plan and demonstrated an understanding of the instructions.   The patient was advised to call back or seek an in-person evaluation if the symptoms worsen or if the condition fails to improve as anticipated.  The above assessment and management plan was discussed with the patient. The patient verbalized understanding of and has  agreed to the management plan. Patient is aware to call the clinic if symptoms persist or worsen. Patient is aware when to return to the clinic for a follow-up visit. Patient educated on when it is appropriate to go to the emergency department.    I provided 7 minutes of non-face-to-face time during this encounter.    Worthy Rancher, MD

## 2020-06-02 NOTE — Chronic Care Management (AMB) (Signed)
  Chronic Care Patel   Note  06/02/2020 Name: Susan Patel MRN: [id number] DOB: 24-Sep-1940  Susan Patel is a 80 y.o. year old female who is a primary care patient of Dettinger, Fransisca Kaufmann, MD. I reached out to Susan Patel by phone today in response to a referral sent by Susan Patel's health plan.     [name] was given information about Chronic Care Patel services today including:  1. CCM service includes personalized support from designated clinical staff supervised by her physician, including individualized plan of care and coordination with other care providers 2. 24/7 contact phone numbers for assistance for urgent and routine care needs. 3. Service will only be billed when office clinical staff spend 20 minutes or more in a month to coordinate care. 4. Only one practitioner may furnish and bill the service in a calendar month. 5. The patient may stop CCM services at any time (effective at the end of the month) by phone call to the office staff. 6. The patient will be responsible for cost sharing (co-pay) of up to 20% of the service fee (after annual deductible is met).  Spouse Mr. Susan Patel  verbally agreed to assistance and services provided by embedded care coordination/care Patel team today.  Follow up plan: Telephone appointment with care Patel team member scheduled for:06/26/2020  Susan Patel

## 2020-06-12 DIAGNOSIS — Z23 Encounter for immunization: Secondary | ICD-10-CM | POA: Diagnosis not present

## 2020-06-24 ENCOUNTER — Ambulatory Visit: Payer: BC Managed Care – PPO | Admitting: Internal Medicine

## 2020-06-24 NOTE — Progress Notes (Deleted)
   Susan Patel, female    DOB: [birth year],    MRN: [id number]   Brief patient profile:  44 yowf never smoker with pattern of recurrent "bronchitis" going back to around 2005 spring /fall while living in [place] eval by ENT/allergist pos for weeds/grass never took shots rx with abx/prednsione prn  then advair / breo and singulair added but  worse than usual x summer of 2021 on a background of chronic dysphagia s/p cva with choking on liquids so referred to pulmonary clinic in [facility]  05/13/2020 by Dr  Dettinger.   History of Present Illness  05/13/2020  Pulmonary/ 1st office eval/ [facility] / [facility]  Chief Complaint  Patient presents with  . Consult    Non productive cough  Dyspnea:  Really not limited by breathing / some balance does shopping including [employer] but not the mall Cough: dry/ sometimes worse p meals Sleep: hob 30 degrees / occ coughs  SABA use: worse p saba / better p prednisone and 1st gen H1 vs new generation rx  rec Stop Breo and omeprazole  AS NEEDED  For drainage / throat tickle try take CHLORPHENIRAMINE  4 mg   SHORT TERM Prednisone 10 mg take  4 each am x 2 days,   2 each am x 2 days,  1 each am x 2 days and stop  MAINTENANCE Pantoprazole (protonix) 40 mg   Take  30-60 min before first meal of the day and Pepcid (famotidine)  20 mg one after supper until return to office - this is the best way to tell whether stomach acid is contributing to your problem.   GERD diet/     Try reduce 02 to 2lpm at bedime and we will arrange for an overnight oxygen test to be sure it's enough. Please schedule a follow up office visit in 6 weeks, call sooner if needed with all medications /inhalers/ solutions in hand                  Past Medical History:  Diagnosis Date  . Arthritis    'all over"  . Asthma   . CAD (coronary artery disease)    Multivessel CAD, not a candidate for CABG, managing medically.  . Chronic pain   . COPD (chronic obstructive pulmonary  disease) (HCC)    O2 per [facility] at nights   . Depression   . Diabetes mellitus without complication ([place])    borderline- , states she was on med., but MD told her "everything is under control so I threw the bottle away"  . Fibromyalgia   . GERD (gastroesophageal reflux disease)   . Hypertension   . Neuromuscular disorder (HCC)    parkinson, neuropathy- both feet & hands.  . 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

## 2020-06-25 ENCOUNTER — Encounter: Payer: Self-pay | Admitting: Family Medicine

## 2020-06-25 ENCOUNTER — Other Ambulatory Visit: Payer: Self-pay

## 2020-06-25 ENCOUNTER — Ambulatory Visit (INDEPENDENT_AMBULATORY_CARE_PROVIDER_SITE_OTHER): Payer: Medicare HMO | Admitting: Family Medicine

## 2020-06-25 VITALS — BP 141/82 | HR 65 | Ht 61.0 in | Wt 197.0 lb

## 2020-06-25 DIAGNOSIS — E1142 Type 2 diabetes mellitus with diabetic polyneuropathy: Secondary | ICD-10-CM

## 2020-06-25 DIAGNOSIS — I5022 Chronic systolic (congestive) heart failure: Secondary | ICD-10-CM | POA: Diagnosis not present

## 2020-06-25 DIAGNOSIS — I10 Essential (primary) hypertension: Secondary | ICD-10-CM | POA: Diagnosis not present

## 2020-06-25 LAB — BAYER DCA HB A1C WAIVED: HB A1C (BAYER DCA - WAIVED): 5.9 % (ref <7.0)

## 2020-06-25 NOTE — Progress Notes (Signed)
BP (!) 141/82   Pulse 65   Ht _0  (1.549 m)   Wt 197 lb (89.4 kg)   SpO2 96%   BMI 37.22 kg/m    Subjective:   Patient ID: Susan Patel, female    DOB: 1940-12-09, 80 y.o.   MRN: [id number]  HPI: Susan Patel is a 80 y.o. female presenting on 06/25/2020 for Medical Management of Chronic Issues, Diabetes, and Hypertension   HPI Type 2 diabetes mellitus Patient comes in today for recheck of his diabetes. Patient has been currently taking [place] 10 and Trulicity 6.22, Q3F today is 5.9. Patient is not currently on an ACE inhibitor/ARB. Patient has not seen an ophthalmologist this year. Patient denies any issues with their feet. The symptom started onset as an adult cerebral arterial disease and hypertension and CHF ARE RELATED TO DM   Hypertension and CHF Patient is currently on carvedilol and Imdur, and their blood pressure today is 141/82. Patient denies any lightheadedness or dizziness. Patient denies headaches, blurred vision, chest pains, shortness of breath, or weakness. Denies any side effects from medication and is content with current medication.    Relevant past medical, surgical, family and social history reviewed and updated as indicated. Interim medical history since our last visit reviewed. Allergies and medications reviewed and updated.  Review of Systems  Constitutional: Negative for chills and fever.  Eyes: Negative for visual disturbance.  Respiratory: Negative for chest tightness and shortness of breath.   Cardiovascular: Negative for chest pain and leg swelling.  Musculoskeletal: Negative for back pain and gait problem.  Skin: Negative for rash.  Neurological: Negative for light-headedness and headaches.  Psychiatric/Behavioral: Negative for agitation and behavioral problems.  All other systems reviewed and are negative.   Per HPI unless specifically indicated above   Allergies as of 06/25/2020      Reactions   Azithromycin Other (See Comments)    Renal failure   Prednisone Other (See Comments)   Irritability and insomnia   Sulfamethoxazole-trimethoprim Other (See Comments)   Renal failure   Amlodipine Swelling   Caused swelling in legs   Ciprofloxacin Itching, Rash   Tape Rash      Medication List       Accurate as of June 25, 2020  2:02 PM. If you have any questions, ask your nurse or doctor.        acetaminophen 325 MG tablet Commonly known as: Tylenol Take 2 tablets (650 mg total) by mouth every 6 (six) hours as needed.   albuterol 108 (90 Base) MCG/ACT inhaler Commonly known as: VENTOLIN HFA Inhale 1-2 puffs into the lungs every 6 (six) hours as needed for wheezing or shortness of breath.   aspirin 81 MG EC tablet TAKE 1 TABLET (81 MG TOTAL) BY MOUTH DAILY FOR 90 DAYS.   B-D UF III MINI PEN NEEDLES 31G X 5 MM Misc Generic drug: Insulin Pen Needle 1 each by Does not apply route daily.   carvedilol 12.5 MG tablet Commonly known as: COREG Take 1 tablet (12.5 mg total) by mouth 2 (two) times daily.   famotidine 20 MG tablet Commonly known as: Pepcid One after supper   fluticasone 50 MCG/ACT nasal spray Commonly known as: FLONASE Place 1 spray into both nostrils 2 (two) times daily as needed for allergies or rhinitis.   glucose blood test strip Use as instructed   isosorbide mononitrate 30 MG 24 hr tablet Commonly known as: IMDUR TAKE 1 TABLET BY MOUTH EVERY DAY  linaclotide 290 MCG Caps capsule Commonly known as: Linzess Take 1 capsule (290 mcg total) by mouth daily before breakfast.   methocarbamol 750 MG tablet Commonly known as: ROBAXIN Take 1 tablet (750 mg total) by mouth at bedtime as needed.   montelukast 10 MG tablet Commonly known as: SINGULAIR Take 1 tablet (10 mg total) by mouth daily.   multivitamin tablet Take 1 tablet by mouth daily.   nitroGLYCERIN 0.4 MG SL tablet Commonly known as: NITROSTAT PLACE 1 TABLET (0.4 MG TOTAL) UNDER THE TONGUE EVERY 5 (FIVE) MINUTES AS  NEEDED.   OneTouch Delica Lancets Fine Misc TEST TWICE DAILY   OXYGEN Inhale 3-4 L into the lungs at bedtime.   pantoprazole 40 MG tablet Commonly known as: Protonix Take 1 tablet (40 mg total) by mouth daily. Take 30-60 min before first meal of the day   rosuvastatin 10 MG tablet Commonly known as: CRESTOR Take 1 tablet (10 mg total) by mouth daily.   traMADol 50 MG tablet Commonly known as: ULTRAM Take 1 tablet (50 mg total) by mouth every 8 (eight) hours as needed.   traZODone 50 MG tablet Commonly known as: DESYREL Take 1 tablet (50 mg total) by mouth at bedtime as needed for sleep.   Tyler Aas FlexTouch 100 UNIT/ML FlexTouch Pen Generic drug: insulin degludec Inject 10 Units into the skin daily.   Trulicity 5.62 BW/3.8LH Sopn Generic drug: Dulaglutide Inject 0.75 mg into the skin once a week.   vitamin B-12 1000 MCG tablet Commonly known as: CYANOCOBALAMIN Take 1,000 mcg by mouth daily.        Objective:   BP (!) 141/82   Pulse 65   Ht _0  (1.549 m)   Wt 197 lb (89.4 kg)   SpO2 96%   BMI 37.22 kg/m   Wt Readings from Last 3 Encounters:  06/25/20 197 lb (89.4 kg)  05/13/20 196 lb 6.4 oz (89.1 kg)  03/14/20 192 lb 12.8 oz (87.5 kg)    Physical Exam Vitals and nursing note reviewed.  Constitutional:      General: Susan Patel is not in acute distress.    Appearance: Susan Patel is well-developed and well-nourished. Susan Patel is not diaphoretic.  Eyes:     Extraocular Movements: EOM normal.     Conjunctiva/sclera: Conjunctivae normal.  Cardiovascular:     Rate and Rhythm: Normal rate and regular rhythm.     Pulses: Intact distal pulses.     Heart sounds: Normal heart sounds. No murmur heard.   Pulmonary:     Effort: Pulmonary effort is normal. No respiratory distress.     Breath sounds: Normal breath sounds. No wheezing.  Musculoskeletal:        General: No tenderness or edema. Normal range of motion.  Skin:    General: Skin is warm and dry.     Findings: No rash.   Neurological:     Mental Status: Susan Patel is alert and oriented to person, place, and time.     Coordination: Coordination normal.  Psychiatric:        Mood and Affect: Mood and affect normal.        Behavior: Behavior normal.       Assessment & Plan:   Problem List Items Addressed This Visit      Cardiovascular and Mediastinum   Essential hypertension   Relevant Orders   CBC with Differential/Platelet   CMP14+EGFR   Lipid panel   Bayer DCA Hb A1c Waived   Chronic systolic CHF (congestive heart failure) (  [place])     Endocrine   Type 2 diabetes mellitus with peripheral neuropathy (HCC) - Primary   Relevant Orders   CBC with Differential/Platelet   CMP14+EGFR   Lipid panel   Bayer DCA Hb A1c Waived   Microalbumin / creatinine urine ratio      Continue current medications except for we will decrease her Tresiba from 10 units down to 5 units a day because her A1c is 5.9, concerned Susan Patel is having some hypoglycemic episodes that we do not know about. Follow up plan: Return in about 6 months (around 12/23/2020), or if symptoms worsen or fail to improve, for Hypertension diabetes and CHF.  Counseling provided for all of the vaccine components Orders Placed This Encounter  Procedures  . CBC with Differential/Platelet  . CMP14+EGFR  . Lipid panel  . Bayer DCA Hb A1c Waived  . Microalbumin / creatinine urine ratio    Caryl Pina, MD [facility] 06/25/2020, 2:02 PM

## 2020-06-26 ENCOUNTER — Other Ambulatory Visit: Payer: Self-pay

## 2020-06-26 ENCOUNTER — Ambulatory Visit: Payer: Medicare HMO | Admitting: *Deleted

## 2020-06-26 ENCOUNTER — Telehealth: Payer: Self-pay

## 2020-06-26 DIAGNOSIS — G4734 Idiopathic sleep related nonobstructive alveolar hypoventilation: Secondary | ICD-10-CM

## 2020-06-26 LAB — CBC WITH DIFFERENTIAL/PLATELET
Basophils Absolute: 0.1 10*3/uL (ref 0.0–0.2)
Basos: 2 %
EOS (ABSOLUTE): 0.3 10*3/uL (ref 0.0–0.4)
Eos: 5 %
Hematocrit: 41.3 % (ref 34.0–46.6)
Hemoglobin: 13.6 g/dL (ref 11.1–15.9)
Immature Grans (Abs): 0 10*3/uL (ref 0.0–0.1)
Immature Granulocytes: 0 %
Lymphocytes Absolute: 2 10*3/uL (ref 0.7–3.1)
Lymphs: 33 %
MCH: 28.9 pg (ref 26.6–33.0)
MCHC: 32.9 g/dL (ref 31.5–35.7)
MCV: 88 fL (ref 79–97)
Monocytes Absolute: 0.8 10*3/uL (ref 0.1–0.9)
Monocytes: 13 %
Neutrophils Absolute: 3 10*3/uL (ref 1.4–7.0)
Neutrophils: 47 %
Platelets: 278 10*3/uL (ref 150–450)
RBC: 4.7 x10E6/uL (ref 3.77–5.28)
RDW: 13.2 % (ref 11.7–15.4)
WBC: 6.2 10*3/uL (ref 3.4–10.8)

## 2020-06-26 LAB — MICROALBUMIN / CREATININE URINE RATIO
Creatinine, Urine: 31.3 mg/dL
Microalb/Creat Ratio: <10 mg/g creat (ref 0–29)
Microalbumin, Urine: <3 ug/mL

## 2020-06-26 LAB — CMP14+EGFR
ALT: 8 IU/L (ref 0–32)
AST: 17 IU/L (ref 0–40)
Albumin/Globulin Ratio: 1.8 (ref 1.2–2.2)
Albumin: 4.2 g/dL (ref 3.7–4.7)
Alkaline Phosphatase: 147 IU/L — ABNORMAL HIGH (ref 44–121)
BUN/Creatinine Ratio: 19 (ref 12–28)
BUN: 18 mg/dL (ref 8–27)
Bilirubin Total: 0.4 mg/dL (ref 0.0–1.2)
CO2: 25 mmol/L (ref 20–29)
Calcium: 9.3 mg/dL (ref 8.7–10.3)
Chloride: 102 mmol/L (ref 96–106)
Creatinine, Ser: 0.97 mg/dL (ref 0.57–1.00)
GFR calc Af Amer: 65 mL/min/{1.73_m2} (ref >59)
GFR calc non Af Amer: 57 mL/min/{1.73_m2} — ABNORMAL LOW (ref >59)
Globulin, Total: 2.4 g/dL (ref 1.5–4.5)
Glucose: 111 mg/dL — ABNORMAL HIGH (ref 65–99)
Potassium: 4.4 mmol/L (ref 3.5–5.2)
Sodium: 143 mmol/L (ref 134–144)
Total Protein: 6.6 g/dL (ref 6.0–8.5)

## 2020-06-26 LAB — LIPID PANEL
Chol/HDL Ratio: 4.3 ratio (ref 0.0–4.4)
Cholesterol, Total: 204 mg/dL — ABNORMAL HIGH (ref 100–199)
HDL: 48 mg/dL (ref >39)
LDL Chol Calc (NIH): 131 mg/dL — ABNORMAL HIGH (ref 0–99)
Triglycerides: 137 mg/dL (ref 0–149)
VLDL Cholesterol Cal: 25 mg/dL (ref 5–40)

## 2020-06-26 NOTE — Telephone Encounter (Signed)
Called and spoke with patient per verbal from Dr Melvyn Novas to let patient know that ONO on 2 Liters O2 is being ordered and patient should expect phone call to set that up. Patient agreeable and expressed understanding. Nothing further needed at this time.

## 2020-06-26 NOTE — Chronic Care Management (AMB) (Signed)
    Chronic Care Management   Outreach Note  06/26/2020 Name: Susan Patel MRN: [id number] DOB: 10-Dec-1940  Referred by: Dettinger, Fransisca Kaufmann, MD Reason for referral : Chronic Care Management (Initial Visit)   An unsuccessful Initial Telephone Visit was attempted today. The patient was referred to the case management team for assistance with care management and care coordination. I was able to reach her husband, [name], on his cell phone but the connection was very bad and I wasn't able to understand anything that was said. Two telephone calls to the home were unsuccessful as well.   Clinical Goals: . Over the next 30 days, patient will be contacted by a Care Guide to reschedule their CCM Visit  Interventions and Plan . Chart reviewed in preparation for telephone visit . Collaboration with other care team members as needed . A HIPAA compliant phone message was left for the patient providing contact information and requesting a return call.  . Request sent to care guides to reach out and reschedule patient's telephone visit   Chong Sicilian, BSN, RN-BC [facility] Direct Dial: [phone]

## 2020-06-27 ENCOUNTER — Telehealth: Payer: Self-pay

## 2020-06-27 NOTE — Chronic Care Management (AMB) (Signed)
  Care Management   Note  06/27/2020 Name: Susan Patel MRN: [id number] DOB: 1940/07/05  Susan Patel is a 80 y.o. year old female who is a primary care patient of Dettinger, Fransisca Kaufmann, MD and is actively engaged with the care management team. I reached out to [name] by phone today to assist with re-scheduling an initial visit with the RN Case Manager  Follow up plan: Unsuccessful telephone outreach attempt made. A HIPAA compliant phone message was left for the patient providing contact information and requesting a return call.  The care management team will reach out to the patient again over the next 7 days.  If patient returns call to provider office, please advise to call [facility]  at [address], [place], [place], [place] [address] Direct Dial: [phone] [email or web address]

## 2020-07-03 DIAGNOSIS — G4730 Sleep apnea, unspecified: Secondary | ICD-10-CM | POA: Diagnosis not present

## 2020-07-03 DIAGNOSIS — R0683 Snoring: Secondary | ICD-10-CM | POA: Diagnosis not present

## 2020-07-03 MED ORDER — ROSUVASTATIN CALCIUM 20 MG PO TABS: 20.0000 mg | ORAL_TABLET | Freq: Every day | ORAL | 1 refills | Status: DC

## 2020-07-03 NOTE — Addendum Note (Signed)
Addended by: Karle Plumber on: 07/03/2020 09:33 AM   Modules accepted: Orders

## 2020-07-04 DIAGNOSIS — Z6834 Body mass index (BMI) 34.0-34.9, adult: Secondary | ICD-10-CM | POA: Diagnosis not present

## 2020-07-04 DIAGNOSIS — M47812 Spondylosis without myelopathy or radiculopathy, cervical region: Secondary | ICD-10-CM | POA: Diagnosis not present

## 2020-07-04 DIAGNOSIS — I10 Essential (primary) hypertension: Secondary | ICD-10-CM | POA: Diagnosis not present

## 2020-07-29 ENCOUNTER — Other Ambulatory Visit: Payer: Self-pay | Admitting: Family Medicine

## 2020-07-30 ENCOUNTER — Ambulatory Visit: Payer: Medicare HMO | Admitting: Family Medicine

## 2020-08-05 ENCOUNTER — Other Ambulatory Visit: Payer: Self-pay | Admitting: Family Medicine

## 2020-08-05 DIAGNOSIS — J4540 Moderate persistent asthma, uncomplicated: Secondary | ICD-10-CM

## 2020-08-05 NOTE — Progress Notes (Signed)
I wouldn't consider the call I made a conversation. He answered his cell phone but I couldn't understand anything that he said. I guess the connection was bad. I tried to reach them at home as well but didn't get an answer.

## 2020-08-05 NOTE — Progress Notes (Signed)
Susan Patel   I was a little confused I reached out to reschedule pt and husband states that he had spoken with you, patients appt says comp but note does not say that you spoke with him. Patients husband states that he did not want to schedule another visit at this time   Thank you

## 2020-08-08 ENCOUNTER — Other Ambulatory Visit: Payer: Self-pay | Admitting: Internal Medicine

## 2020-08-08 ENCOUNTER — Other Ambulatory Visit: Payer: Self-pay | Admitting: *Deleted

## 2020-08-08 DIAGNOSIS — R058 Other specified cough: Secondary | ICD-10-CM

## 2020-08-08 MED ORDER — NITROGLYCERIN 0.4 MG SL SUBL: 0.4000 mg | SUBLINGUAL_TABLET | SUBLINGUAL | 0 refills | Status: DC | PRN

## 2020-08-08 MED ORDER — PANTOPRAZOLE SODIUM 40 MG PO TBEC: 40.0000 mg | DELAYED_RELEASE_TABLET | Freq: Every day | ORAL | 0 refills | Status: DC

## 2020-08-29 ENCOUNTER — Other Ambulatory Visit: Payer: Self-pay | Admitting: Family Medicine

## 2020-08-29 DIAGNOSIS — J4540 Moderate persistent asthma, uncomplicated: Secondary | ICD-10-CM

## 2020-08-29 IMAGING — CT CT CHEST WITHOUT CONTRAST
2 of 3 series · 15 of 36 positions shown, 18 images · non-contrast
Comparison: Coronary CTA dated January 10, 2018.

CLINICAL DATA: Lung nodule follow-up.

EXAM:
CT CHEST WITHOUT CONTRAST
TECHNIQUE: Multidetector CT imaging of the chest was performed following the
standard protocol without IV contrast.

[Series 2: thorax · axial · 0.54mm/px · z∈[-264,-54]mm · 12 of 125 slices shown, 15 images]
[im 10/125  mediastinal]
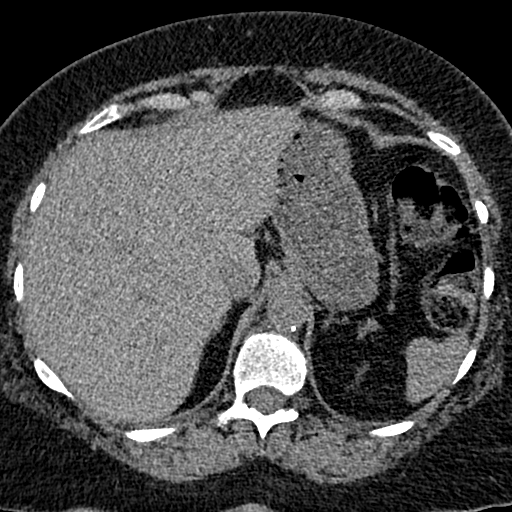
[im 10/125  lung]
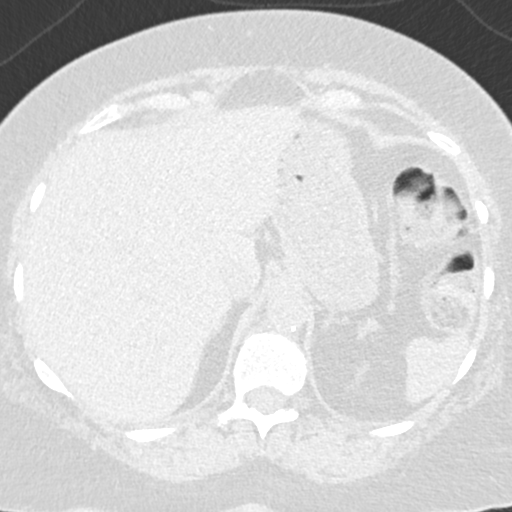
[im 19/125  lung]
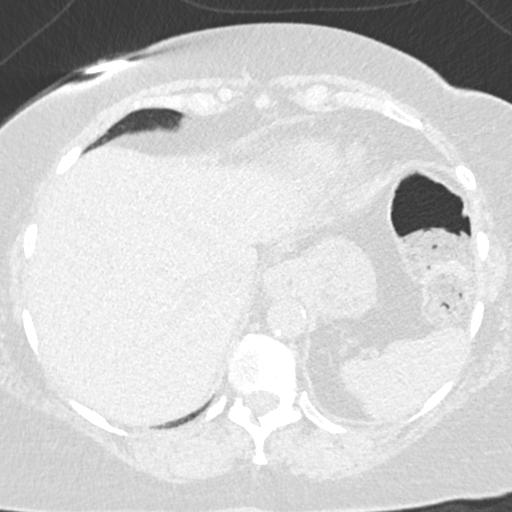
[im 28/125  lung]
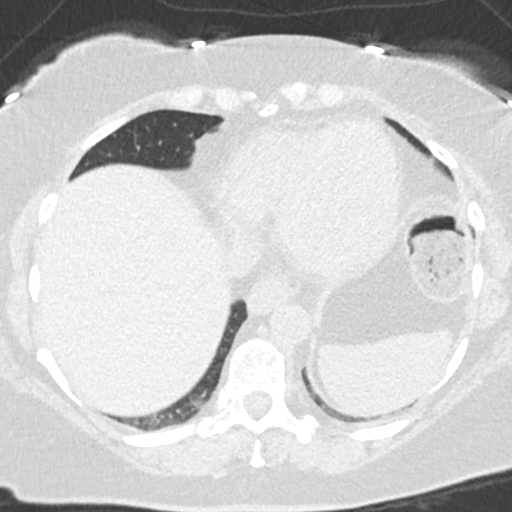
[im 37/125  lung]
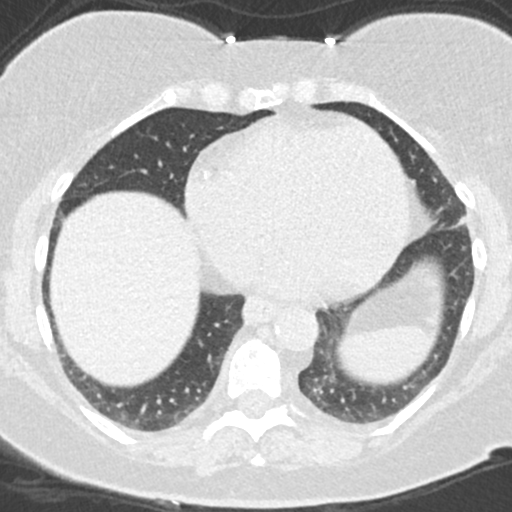
[im 46/125  mediastinal]
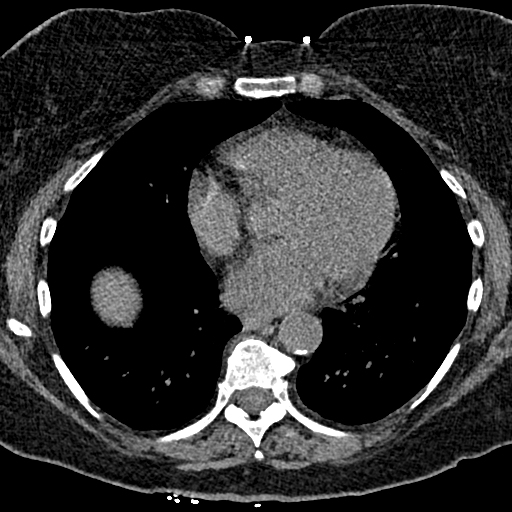
[im 46/125  lung]
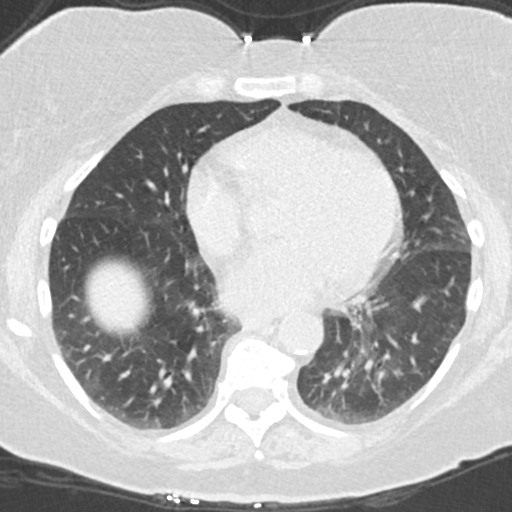
[im 56/125  lung]
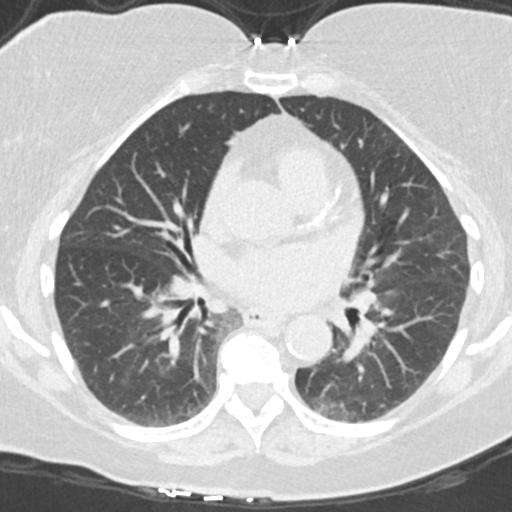
[im 69/125  lung]
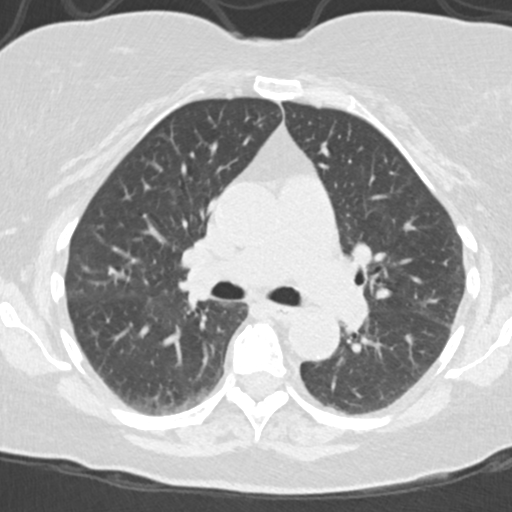
[im 79/125  lung]
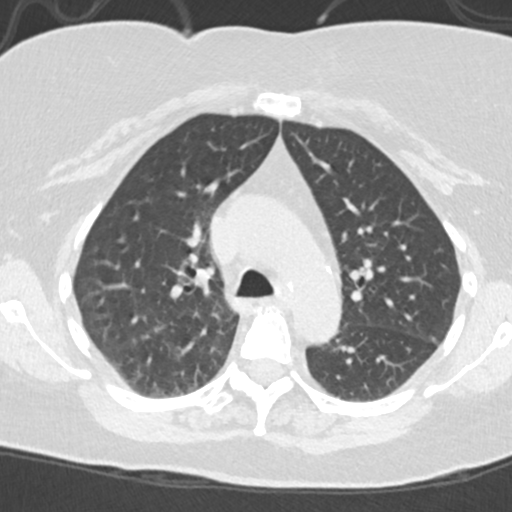
[im 88/125  mediastinal]
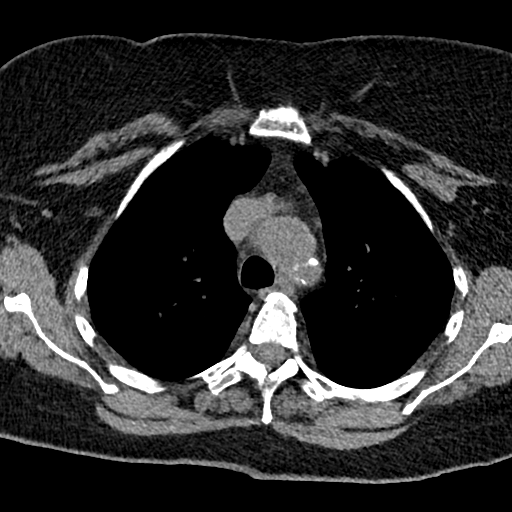
[im 88/125  lung]
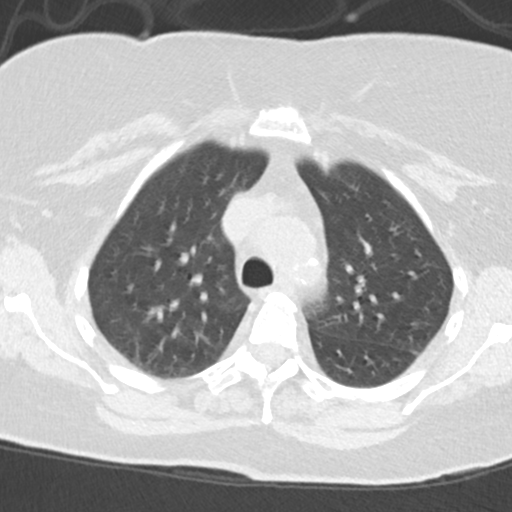
[im 97/125  lung]
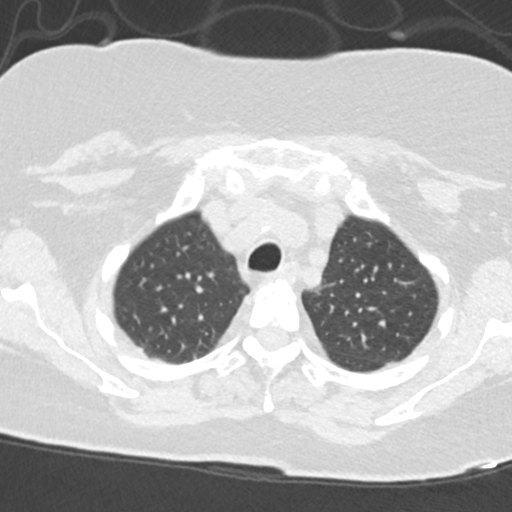
[im 106/125  lung]
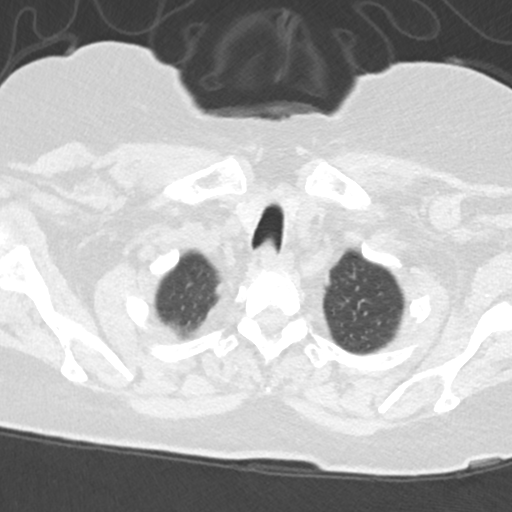
[im 115/125  lung]
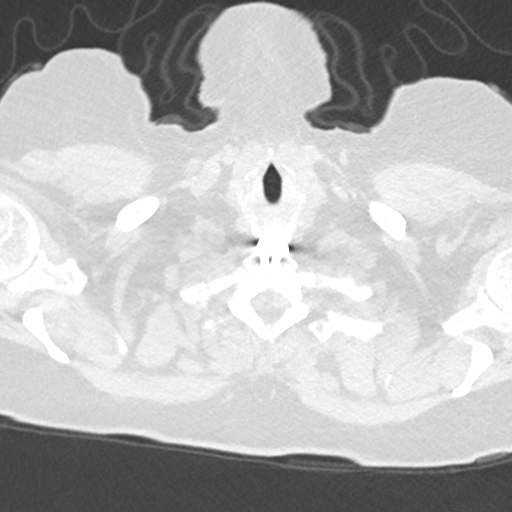

[Series 5: coronal · coronal · 0.49mm/px · 3 of 105 slices shown]
[im 21/105  lung]
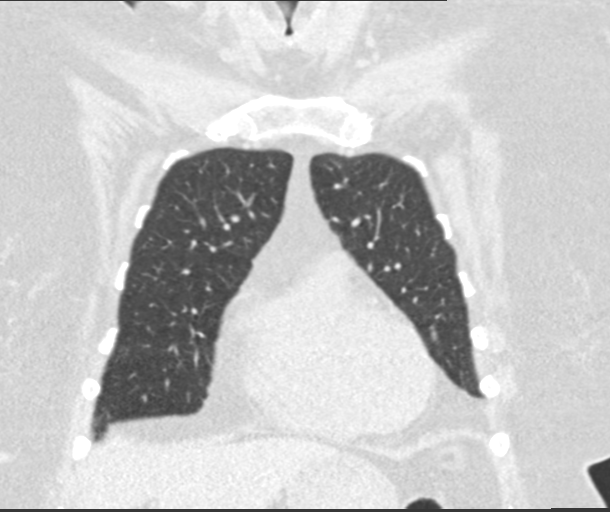
[im 42/105  lung]
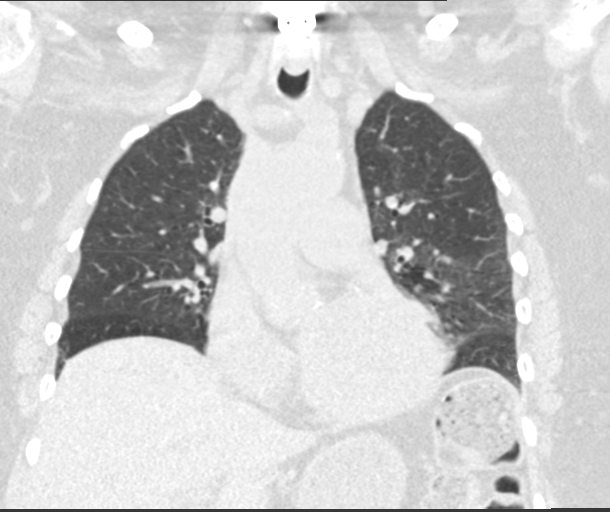
[im 63/105  lung]
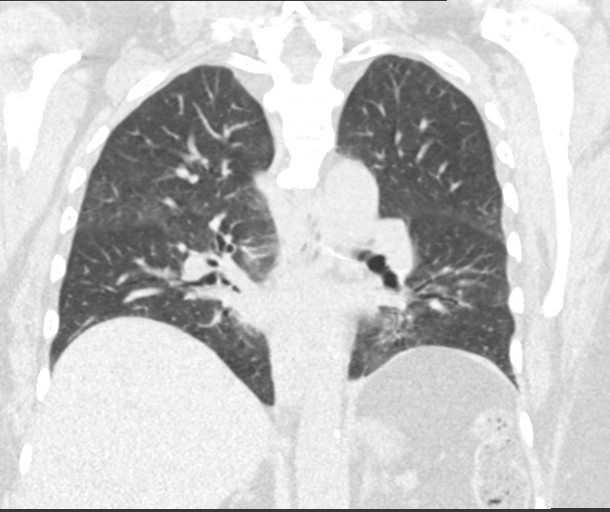

[15 of 36 positions shown; findings below may reference images not displayed]

FINDINGS: Cardiovascular: Normal heart size. No pericardial effusion. No
thoracic aortic aneurysm. Coronary, aortic arch, and branch vessel
atherosclerotic vascular disease.

Mediastinum/Nodes: No enlarged mediastinal or axillary lymph nodes.
Thyroid gland, trachea, and esophagus demonstrate no significant
findings.

Lungs/Pleura: Unchanged 5 x 3 mm subpleural nodule in the lingula
(series 4, image 74). Unchanged 7 x 4 mm nodule in the more inferior
lingula (series 4, image 93). Unchanged 9 x 5 mm subpleural nodule
in the left lower lobe (series 4, image 93). Unchanged 4 mm
subpleural nodule in the right middle lobe (series 4, image 86).
Unchanged 6 x 4 mm subpleural nodule in the right lower lobe (series
4, image 75). No focal consolidation, pleural effusion, or
pneumothorax.

Upper Abdomen: No acute abnormality. Punctate hepatic and splenic
calcified granulomas.

Musculoskeletal: No chest wall mass or suspicious bone lesions
identified. Prior lower cervical ACDF.
IMPRESSION: 1. Multiple bilateral pulmonary nodules measuring up to 7 mm in mean
diameter are unchanged when compared to prior study. Non-contrast
chest CT at 12-18 months (from today's scan) is considered optional
for low-risk patients, but is recommended for high-risk patients.
This recommendation follows the consensus statement: Guidelines for
Management of Incidental Pulmonary Nodules Detected on CT Images:
2.  Aortic atherosclerosis (08GR8-SQO.O).

## 2020-09-01 DIAGNOSIS — T17908A Unspecified foreign body in respiratory tract, part unspecified causing other injury, initial encounter: Secondary | ICD-10-CM | POA: Insufficient documentation

## 2020-09-01 DIAGNOSIS — R062 Wheezing: Secondary | ICD-10-CM | POA: Diagnosis not present

## 2020-09-01 NOTE — Chronic Care Management (AMB) (Signed)
  Care Management   Note  09/01/2020 Name: Susan Patel MRN: [id number] DOB: 1940/12/30  Susan Patel is a 80 y.o. year old female who is a primary care patient of Dettinger, Fransisca Kaufmann, MD and is actively engaged with the care management team. I reached out to [name] by phone today to assist with re-scheduling an initial visit with the RN Case Manager  Follow up plan: Patient's spouse declines further follow up and engagement by the care management team. Appropriate care team members and provider have been notified via electronic communication.   Noreene Larsson, [place], [place], [place] [id number] Direct Dial: [phone] [email or web address] Website: [email or web address]

## 2020-09-03 ENCOUNTER — Other Ambulatory Visit: Payer: Self-pay | Admitting: Internal Medicine

## 2020-09-03 DIAGNOSIS — R058 Other specified cough: Secondary | ICD-10-CM

## 2020-09-03 NOTE — Telephone Encounter (Signed)
[place] x 3 m then return here or see pcp for refills going forward as may not need this long term

## 2020-09-03 NOTE — Telephone Encounter (Signed)
Pt is wanting prescription for pantoprazole, she did not have her follow up appt and doesn't have an upcoming appointment.

## 2020-09-08 ENCOUNTER — Ambulatory Visit (INDEPENDENT_AMBULATORY_CARE_PROVIDER_SITE_OTHER): Payer: Medicare HMO | Admitting: Nurse Practitioner

## 2020-09-08 ENCOUNTER — Encounter: Payer: Self-pay | Admitting: Nurse Practitioner

## 2020-09-08 DIAGNOSIS — R059 Cough, unspecified: Secondary | ICD-10-CM | POA: Diagnosis not present

## 2020-09-08 MED ORDER — BENZONATATE 100 MG PO CAPS: 100.0000 mg | ORAL_CAPSULE | Freq: Three times a day (TID) | ORAL | 0 refills | Status: DC | PRN

## 2020-09-08 NOTE — Progress Notes (Signed)
   Virtual Visit  Note Due to COVID-19 pandemic this visit was conducted virtually. This visit type was conducted due to national recommendations for restrictions regarding the COVID-19 Pandemic (e.g. social distancing, sheltering in place) in an effort to limit this patient's exposure and mitigate transmission in our community. All issues noted in this document were discussed and addressed.  A physical exam was not performed with this format.  I connected with Susan Patel on 09/08/20 at 12:40 pm by telephone and verified that I am speaking with the correct person using two identifiers. Susan Patel is currently located at home and son is currently with patient during visit. The provider, Ivy Lynn, NP is located in their office at time of visit.  I discussed the limitations, risks, security and privacy concerns of performing an evaluation and management service by telephone and the availability of in person appointments. I also discussed with the patient that there may be a patient responsible charge related to this service. The patient expressed understanding and agreed to proceed.   History and Present Illness:  Cough This is a recurrent problem. The current episode started in the past 7 days. The problem has been unchanged. The problem occurs constantly. The cough is non-productive. Pertinent negatives include no chest pain, chills, ear congestion or fever. Exacerbated by: swallowing. She has tried oral steroids for the symptoms. The treatment provided mild relief. Her past medical history is significant for COPD.      Review of Systems  Constitutional: Negative for chills and fever.  Respiratory: Positive for cough.   Cardiovascular: Negative for chest pain.     Observations/Objective:  Televisit patient did not sound to be in distress.    Assessment and Plan:  Cough Family member reported that patient tried swallowing her pills few days ago and started choking on  pills.  Uncontrolled cough continued persistently with no relief.  Patient was transported to the emergency department where she was given steroids.  X-ray completed showing no obstruction in the airway. Family is reporting that patient still continues to cough but symptoms have gradually reduced.  I did not reorder oral steroids as requested by family.  Started patient on benzonatate 100 mg tablet by mouth 3 times daily as needed for cough.  Continue breathing treatment as prescribed.  Chloraseptic spray to soothe throat.  Follow-up with unresolved symptoms.  Rx sent to pharmacy     Follow Up Instructions: Follow-up as needed    I discussed the assessment and treatment plan with the patient. The patient was provided an opportunity to ask questions and all were answered. The patient agreed with the plan and demonstrated an understanding of the instructions.   The patient was advised to call back or seek an in-person evaluation if the symptoms worsen or if the condition fails to improve as anticipated.  The above assessment and management plan was discussed with the patient. The patient verbalized understanding of and has agreed to the management plan. Patient is aware to call the clinic if symptoms persist or worsen. Patient is aware when to return to the clinic for a follow-up visit. Patient educated on when it is appropriate to go to the emergency department.   Time call ended:  12:48 pm  I provided 8 minutes of  non face-to-face time during this encounter.    Ivy Lynn, NP

## 2020-09-08 NOTE — Assessment & Plan Note (Signed)
Family member reported that patient tried swallowing her pills few days ago and started choking on pills.  Uncontrolled cough continued persistently with no relief.  Patient was transported to the emergency department where she was given steroids.  X-ray completed showing no obstruction in the airway. Family is reporting that patient still continues to cough but symptoms have gradually reduced.  I did not reorder oral steroids as requested by family.  Started patient on benzonatate 100 mg tablet by mouth 3 times daily as needed for cough.  Continue breathing treatment as prescribed.  Chloraseptic spray to soothe throat.  Follow-up with unresolved symptoms.  Rx sent to pharmacy

## 2020-09-11 ENCOUNTER — Ambulatory Visit (INDEPENDENT_AMBULATORY_CARE_PROVIDER_SITE_OTHER): Payer: Medicare HMO | Admitting: Family Medicine

## 2020-09-11 ENCOUNTER — Encounter: Payer: Self-pay | Admitting: Family Medicine

## 2020-09-11 VITALS — BP 148/77 | HR 78 | Temp 98.1°F

## 2020-09-11 DIAGNOSIS — G20 Parkinson's disease: Secondary | ICD-10-CM | POA: Diagnosis not present

## 2020-09-11 DIAGNOSIS — R059 Cough, unspecified: Secondary | ICD-10-CM | POA: Diagnosis not present

## 2020-09-11 DIAGNOSIS — J441 Chronic obstructive pulmonary disease with (acute) exacerbation: Secondary | ICD-10-CM

## 2020-09-11 MED ORDER — DOXYCYCLINE HYCLATE 100 MG PO TABS
100.0000 mg | ORAL_TABLET | Freq: Two times a day (BID) | ORAL | 0 refills | Status: AC
Start: 2020-09-11 — End: 2020-09-21

## 2020-09-11 MED ORDER — PREDNISONE 20 MG PO TABS: 20.0000 mg | ORAL_TABLET | Freq: Every day | ORAL | 0 refills | Status: AC

## 2020-09-11 NOTE — Progress Notes (Signed)
Acute Office Visit  Subjective:    Patient ID: Susan Patel, female    DOB: 11-06-1940, 80 y.o.   MRN: [id number]  Chief Complaint  Patient presents with  . Cough    HPI Patient is in today for cough x 2 week. It has been a dry cough. Denies fever or chest pain. She has a history of COPD. She has had increased shortness of breath. She wears oxygen at night. She has tried tessalon perles without improvement. She had prednisone 40 mg x 5 days on 4/18 when seen at Urgent care after a near choking incident. Xray showed no obstruction. She denies chest pain, fever, sore throat, chills, nausea, or vomiting.   Past Medical History:  Diagnosis Date  . Arthritis    'all over"  . Asthma   . CAD (coronary artery disease)    Multivessel CAD, not a candidate for CABG, managing medically.  . Chronic pain   . COPD (chronic obstructive pulmonary disease) (HCC)    O2 per [place] at nights   . Depression   . Diabetes mellitus without complication ([place])    borderline- , states she was on med., but MD told her "everything is under control so I threw the bottle away"  . Fibromyalgia   . GERD (gastroesophageal reflux disease)   . Hypertension   . Neuromuscular disorder (HCC)    parkinson, neuropathy- both feet & hands.  . 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 ABDOMINAL HYSTERECTOMY    . ANTERIOR CERVICAL DECOMP/DISCECTOMY FUSION N/A 07/10/2015   Procedure: Cervical five-six, Cervical six-seven anterior cervical decompression with fusion plating and bonegraft;  Surgeon: Jovita Gamma, MD;  Location: [place] [facility];  Service: Neurosurgery;  Laterality: N/A;  . APPENDECTOMY    . BIOPSY EYE MUSCLE  03/20/2016   biopsy vessel to right eye due to swelling  . EYE SURGERY Bilateral    cataracts removed, /w "cyrstal lenses"   . LEFT HEART CATH AND CORONARY ANGIOGRAPHY N/A 05/23/2018   Procedure: LEFT HEART CATH AND CORONARY ANGIOGRAPHY;  Surgeon:  Troy Sine, MD;  Location: [place] [facility];  Service: Cardiovascular;  Laterality: N/A;  . SHOULDER ARTHROSCOPY Right    x2   RCR- spurs removed     Family History  Problem Relation Age of Onset  . Heart disease Mother   . Cancer Father   . Heart disease Father   . Arthritis Sister   . Heart failure Sister   . Cancer Brother   . Thyroid cancer Son   . Atrial fibrillation Son   . Arthritis Sister     Social History   Socioeconomic History  . Marital status: Married    Spouse name: wayne  . Number of children: 2  . Years of education: 16  . Highest education level: Bachelor's degree (e.g., BA, AB, BS)  Occupational History  . Occupation: retired    Comment: Automotive engineer  Tobacco Use  . Smoking status: Never Smoker  . Smokeless tobacco: Never Used  Vaping Use  . Vaping Use: Never used  Substance and Sexual Activity  . Alcohol use: No  . Drug use: No  . Sexual activity: Yes  Other Topics Concern  . Not on file  Social History Narrative  . Not on file   Social Determinants of Health   Financial Resource Strain: Low Risk   . Difficulty of Paying Living Expenses: Not hard  at all  Food Insecurity: No Food Insecurity  . Worried About Charity fundraiser in the Last Year: Never true  . Ran Out of Food in the Last Year: Never true  Transportation Needs: No Transportation Needs  . Lack of Transportation (Medical): No  . Lack of Transportation (Non-Medical): No  Physical Activity: Inactive  . Days of Exercise per Week: 0 days  . Minutes of Exercise per Session: 0 min  Stress: No Stress Concern Present  . Feeling of Stress : Only a little  Social Connections: Moderately Integrated  . Frequency of Communication with Friends and Family: More than three times a week  . Frequency of Social Gatherings with Friends and Family: Once a week  . Attends Religious Services: More than 4 times per year  . Active Member of Clubs or Organizations: No  . Attends  Archivist Meetings: Never  . Marital Status: Married  Human resources officer Violence: Not At Risk  . Fear of Current or Ex-Partner: No  . Emotionally Abused: No  . Physically Abused: No  . Sexually Abused: No    Outpatient Medications Prior to Visit  Medication Sig Dispense Refill  . acetaminophen (TYLENOL) 325 MG tablet Take 2 tablets (650 mg total) by mouth every 6 (six) hours as needed. 30 tablet 0  . albuterol (VENTOLIN HFA) 108 (90 Base) MCG/ACT inhaler INHALE 1-2 PUFFS BY MOUTH EVERY 6 HOURS AS NEEDED FOR WHEEZE OR SHORTNESS OF BREATH 18 each 3  . benzonatate (TESSALON PERLES) 100 MG capsule Take 1 capsule (100 mg total) by mouth 3 (three) times daily as needed for cough. 30 capsule 0  . carvedilol (COREG) 12.5 MG tablet Take 1 tablet (12.5 mg total) by mouth 2 (two) times daily. 180 tablet 3  . CVS ASPIRIN LOW STRENGTH 81 MG EC tablet TAKE 1 TABLET (81 MG TOTAL) BY MOUTH DAILY FOR 90 DAYS. 90 tablet 1  . Dulaglutide (TRULICITY) A999333 0000000 SOPN Inject 0.75 mg into the skin once a week. 6 mL 3  . famotidine (PEPCID) 20 MG tablet One after supper 30 tablet 11  . fluticasone (FLONASE) 50 MCG/ACT nasal spray Place 1 spray into both nostrils 2 (two) times daily as needed for allergies or rhinitis. 16 g 2  . glucose blood test strip Use as instructed 100 each 2  . insulin degludec (TRESIBA FLEXTOUCH) 100 UNIT/ML FlexTouch Pen Inject 10 Units into the skin daily. 15 mL 3  . Insulin Pen Needle (B-D UF III MINI PEN NEEDLES) 31G X 5 MM MISC 1 each by Does not apply route daily. 90 each 3  . isosorbide mononitrate (IMDUR) 30 MG 24 hr tablet TAKE 1 TABLET BY MOUTH EVERY DAY 90 tablet 3  . linaclotide (LINZESS) 290 MCG CAPS capsule Take 1 capsule (290 mcg total) by mouth daily before breakfast. 90 capsule 3  . methocarbamol (ROBAXIN) 750 MG tablet Take 1 tablet (750 mg total) by mouth at bedtime as needed. 90 tablet 1  . montelukast (SINGULAIR) 10 MG tablet Take 1 tablet (10 mg total) by  mouth daily. 90 tablet 3  . nitroGLYCERIN (NITROSTAT) 0.4 MG SL tablet Place 1 tablet (0.4 mg total) under the tongue every 5 (five) minutes as needed. 25 tablet 0  . ONETOUCH DELICA LANCETS FINE MISC TEST TWICE DAILY 100 each 9  . OXYGEN Inhale 3-4 L into the lungs at bedtime.    . pantoprazole (PROTONIX) 40 MG tablet TAKE 1 TABLET (40 MG TOTAL) BY MOUTH DAILY. TAKE 30-60  MIN BEFORE FIRST MEAL OF THE DAY 30 tablet 2  . rosuvastatin (CRESTOR) 20 MG tablet Take 1 tablet (20 mg total) by mouth daily. 90 tablet 1  . traZODone (DESYREL) 50 MG tablet Take 1 tablet (50 mg total) by mouth at bedtime as needed for sleep. 90 tablet 3  . traMADol (ULTRAM) 50 MG tablet Take 1 tablet (50 mg total) by mouth every 8 (eight) hours as needed. (Patient not taking: Reported on 09/11/2020) 60 tablet 2  . vitamin B-12 (CYANOCOBALAMIN) 1000 MCG tablet Take 1,000 mcg by mouth daily. (Patient not taking: Reported on 09/11/2020)    . Multiple Vitamin (MULTIVITAMIN) tablet Take 1 tablet by mouth daily.     No facility-administered medications prior to visit.    Allergies  Allergen Reactions  . Azithromycin Other (See Comments)    Renal failure  . Prednisone Other (See Comments)    Irritability and insomnia  . Sulfamethoxazole-Trimethoprim Other (See Comments)    Renal failure  . Amlodipine Swelling    Caused swelling in legs  . Ciprofloxacin Itching and Rash  . Tape Rash    Review of Systems As per HPI.     Objective:    Physical Exam Vitals and nursing note reviewed.  Constitutional:      General: She is not in acute distress.    Appearance: She is not ill-appearing, toxic-appearing or diaphoretic.  Cardiovascular:     Rate and Rhythm: Normal rate and regular rhythm.     Pulses: Normal pulses.     Heart sounds: Normal heart sounds.  Pulmonary:     Effort: No respiratory distress.     Breath sounds: Normal breath sounds. No wheezing, rhonchi or rales.  Skin:    General: Skin is warm and dry.   Neurological:     Mental Status: She is alert. Mental status is at baseline.     BP (!) 148/77   Pulse 78   Temp 98.1 F (36.7 C) (Temporal)   SpO2 95%  Wt Readings from Last 3 Encounters:  06/25/20 197 lb (89.4 kg)  05/13/20 196 lb 6.4 oz (89.1 kg)  03/14/20 192 lb 12.8 oz (87.5 kg)    Health Maintenance Due  Topic Date Due  . OPHTHALMOLOGY EXAM  06/26/2020  . FOOT EXAM  07/25/2020    There are no preventive care reminders to display for this patient.   Lab Results  Component Value Date   TSH 3.283 08/20/2018   Lab Results  Component Value Date   WBC 6.2 06/25/2020   HGB 13.6 06/25/2020   HCT 41.3 06/25/2020   MCV 88 06/25/2020   PLT 278 06/25/2020   Lab Results  Component Value Date   NA 143 06/25/2020   K 4.4 06/25/2020   CO2 25 06/25/2020   GLUCOSE 111 (H) 06/25/2020   BUN 18 06/25/2020   CREATININE 0.97 06/25/2020   BILITOT 0.4 06/25/2020   ALKPHOS 147 (H) 06/25/2020   AST 17 06/25/2020   ALT 8 06/25/2020   PROT 6.6 06/25/2020   ALBUMIN 4.2 06/25/2020   CALCIUM 9.3 06/25/2020   ANIONGAP 10 11/22/2019   Lab Results  Component Value Date   CHOL 204 (H) 06/25/2020   Lab Results  Component Value Date   HDL 48 06/25/2020   Lab Results  Component Value Date   LDLCALC 131 (H) 06/25/2020   Lab Results  Component Value Date   TRIG 137 06/25/2020   Lab Results  Component Value Date   CHOLHDL 4.3  06/25/2020   Lab Results  Component Value Date   HGBA1C 5.9 06/25/2020       Assessment & Plan:   Skyley was seen today for cough.  Diagnoses and all orders for this visit:  Cough Covid test pending, quarantine until results. Given history of COPD, will treat for exacerbation.  -     Novel Coronavirus, NAA ([facility])  COPD exacerbation ([place]) History of COPD with increased cough and shortness of breath. Medications as below. Lungs clear today.  -     doxycycline (VIBRA-TABS) 100 MG tablet; Take 1 tablet (100 mg total) by mouth 2 (two) times  daily for 10 days. 1 po bid -     predniSONE (DELTASONE) 20 MG tablet; Take 1 tablet (20 mg total) by mouth daily with breakfast for 3 days.  Return to office for new or worsening symptoms, or if symptoms persist.    The patient indicates understanding of these issues and agrees with the plan.    Gwenlyn Perking, FNP

## 2020-09-12 LAB — SARS-COV-2, NAA 2 DAY TAT

## 2020-09-12 LAB — NOVEL CORONAVIRUS, NAA: SARS-CoV-2, NAA: NOT DETECTED

## 2020-09-18 ENCOUNTER — Ambulatory Visit (INDEPENDENT_AMBULATORY_CARE_PROVIDER_SITE_OTHER): Payer: Medicare HMO | Admitting: Family Medicine

## 2020-09-18 ENCOUNTER — Encounter: Payer: Self-pay | Admitting: Family Medicine

## 2020-09-18 ENCOUNTER — Ambulatory Visit (INDEPENDENT_AMBULATORY_CARE_PROVIDER_SITE_OTHER): Payer: Medicare HMO

## 2020-09-18 ENCOUNTER — Other Ambulatory Visit: Payer: Self-pay

## 2020-09-18 VITALS — BP 149/72 | HR 79 | Ht 61.0 in | Wt 202.0 lb

## 2020-09-18 DIAGNOSIS — I5022 Chronic systolic (congestive) heart failure: Secondary | ICD-10-CM

## 2020-09-18 DIAGNOSIS — R058 Other specified cough: Secondary | ICD-10-CM | POA: Diagnosis not present

## 2020-09-18 DIAGNOSIS — I10 Essential (primary) hypertension: Secondary | ICD-10-CM

## 2020-09-18 DIAGNOSIS — J410 Simple chronic bronchitis: Secondary | ICD-10-CM | POA: Diagnosis not present

## 2020-09-18 DIAGNOSIS — E1142 Type 2 diabetes mellitus with diabetic polyneuropathy: Secondary | ICD-10-CM | POA: Diagnosis not present

## 2020-09-18 DIAGNOSIS — Z780 Asymptomatic menopausal state: Secondary | ICD-10-CM

## 2020-09-18 LAB — BAYER DCA HB A1C WAIVED: HB A1C (BAYER DCA - WAIVED): 7.7 % — ABNORMAL HIGH (ref <7.0)

## 2020-09-18 NOTE — Progress Notes (Signed)
BP (!) 149/72   Pulse 79   Ht 5\' 1"  (1.549 m)   Wt 202 lb (91.6 kg)   SpO2 95%   BMI 38.17 kg/m    Subjective:   Patient ID: Susan Patel, female    DOB: 1940/10/30, 80 y.o.   MRN: [id number]  HPI: Susan Patel is a 80 y.o. female presenting on 09/18/2020 for Medical Management of Chronic Issues and Diabetes   HPI Hypertension Patient is currently on carvedilol and Imdur, and their blood pressure today is 148/72. Patient denies any lightheadedness or dizziness. Patient denies headaches, blurred vision, chest pains, shortness of breath, or weakness. Denies any side effects from medication and is content with current medication.   Type 2 diabetes mellitus Patient comes in today for recheck of his diabetes. Patient has been currently taking Trulicity and [place]. Patient is not currently on an ACE inhibitor/ARB. Patient has not seen an ophthalmologist this year. Patient denies any issues with their feet. The symptom started onset as an adult hypertension and hyperlipidemia ARE RELATED TO DM   Hyperlipidemia Patient is coming in for recheck of his hyperlipidemia. The patient is currently taking Crestor. They deny any issues with myalgias or history of liver damage from it. They deny any focal numbness or weakness or chest pain.   Patient has upper airway cough syndrome and is fighting a cough right now.  She was treated with antibiotic 1 my colleagues and still taking the antibiotic and it has improved some but not significantly.  She denies any fevers or chills.  The cough is mostly dry nonproductive.  Patient is due for bone density scan  Relevant past medical, surgical, family and social history reviewed and updated as indicated. Interim medical history since our last visit reviewed. Allergies and medications reviewed and updated.  Review of Systems  Constitutional: Negative for chills and fever.  HENT: Negative for congestion, ear discharge and ear pain.   Eyes: Negative for  redness and visual disturbance.  Respiratory: Negative for chest tightness and shortness of breath.   Cardiovascular: Negative for chest pain and leg swelling.  Musculoskeletal: Negative for back pain and gait problem.  Skin: Negative for rash.  Neurological: Negative for light-headedness and headaches.  Psychiatric/Behavioral: Positive for confusion (Patient not significantly verbal and has memory issues, chronic). Negative for agitation and behavioral problems.  All other systems reviewed and are negative.   Per HPI unless specifically indicated above   Allergies as of 09/18/2020      Reactions   Azithromycin Other (See Comments)   Renal failure   Prednisone Other (See Comments)   Irritability and insomnia   Sulfamethoxazole-trimethoprim Other (See Comments)   Renal failure   Amlodipine Swelling   Caused swelling in legs   Ciprofloxacin Itching, Rash   Tape Rash      Medication List       Accurate as of Sep 18, 2020  1:16 PM. If you have any questions, ask your nurse or doctor.        STOP taking these medications   benzonatate 100 MG capsule Commonly known as: Best boy Stopped by: Fransisca Kaufmann [name], MD   traMADol 50 MG tablet Commonly known as: ULTRAM Stopped by: Fransisca Kaufmann Mayra Brahm, MD     TAKE these medications   acetaminophen 325 MG tablet Commonly known as: Tylenol Take 2 tablets (650 mg total) by mouth every 6 (six) hours as needed.   albuterol 108 (90 Base) MCG/ACT inhaler Commonly known as: VENTOLIN  HFA INHALE 1-2 PUFFS BY MOUTH EVERY 6 HOURS AS NEEDED FOR WHEEZE OR SHORTNESS OF BREATH   B-D UF III MINI PEN NEEDLES 31G X 5 MM Misc Generic drug: Insulin Pen Needle 1 each by Does not apply route daily.   carvedilol 12.5 MG tablet Commonly known as: COREG Take 1 tablet (12.5 mg total) by mouth 2 (two) times daily.   CVS Aspirin Low Strength 81 MG EC tablet Generic drug: aspirin TAKE 1 TABLET (81 MG TOTAL) BY MOUTH DAILY FOR 90 DAYS.    doxycycline 100 MG tablet Commonly known as: VIBRA-TABS Take 1 tablet (100 mg total) by mouth 2 (two) times daily for 10 days. 1 po bid   famotidine 20 MG tablet Commonly known as: Pepcid One after supper   fluticasone 50 MCG/ACT nasal spray Commonly known as: FLONASE Place 1 spray into both nostrils 2 (two) times daily as needed for allergies or rhinitis.   glucose blood test strip Use as instructed   isosorbide mononitrate 30 MG 24 hr tablet Commonly known as: IMDUR TAKE 1 TABLET BY MOUTH EVERY DAY   linaclotide 290 MCG Caps capsule Commonly known as: Linzess Take 1 capsule (290 mcg total) by mouth daily before breakfast.   methocarbamol 750 MG tablet Commonly known as: ROBAXIN Take 1 tablet (750 mg total) by mouth at bedtime as needed.   montelukast 10 MG tablet Commonly known as: SINGULAIR Take 1 tablet (10 mg total) by mouth daily.   nitroGLYCERIN 0.4 MG SL tablet Commonly known as: NITROSTAT Place 1 tablet (0.4 mg total) under the tongue every 5 (five) minutes as needed.   OneTouch Delica Lancets Fine Misc TEST TWICE DAILY   OXYGEN Inhale 3-4 L into the lungs at bedtime.   pantoprazole 40 MG tablet Commonly known as: PROTONIX TAKE 1 TABLET (40 MG TOTAL) BY MOUTH DAILY. TAKE 30-60 MIN BEFORE FIRST MEAL OF THE DAY   rosuvastatin 20 MG tablet Commonly known as: Crestor Take 1 tablet (20 mg total) by mouth daily.   traZODone 50 MG tablet Commonly known as: DESYREL Take 1 tablet (50 mg total) by mouth at bedtime as needed for sleep.   Tyler Aas FlexTouch 100 UNIT/ML FlexTouch Pen Generic drug: insulin degludec Inject 10 Units into the skin daily.   Trulicity 9.56 OZ/3.0QM Sopn Generic drug: Dulaglutide Inject 0.75 mg into the skin once a week.   vitamin B-12 1000 MCG tablet Commonly known as: CYANOCOBALAMIN Take 1,000 mcg by mouth daily.        Objective:   BP (!) 149/72   Pulse 79   Ht 5\' 1"  (1.549 m)   Wt 202 lb (91.6 kg)   SpO2 95%   BMI  38.17 kg/m   Wt Readings from Last 3 Encounters:  09/18/20 202 lb (91.6 kg)  06/25/20 197 lb (89.4 kg)  05/13/20 196 lb 6.4 oz (89.1 kg)    Physical Exam Vitals and nursing note reviewed.  Constitutional:      General: She is not in acute distress.    Appearance: She is well-developed. She is not diaphoretic.  Eyes:     Conjunctiva/sclera: Conjunctivae normal.  Cardiovascular:     Rate and Rhythm: Normal rate and regular rhythm.     Heart sounds: Normal heart sounds. No murmur heard.   Pulmonary:     Effort: Pulmonary effort is normal. No respiratory distress.     Breath sounds: Normal breath sounds. No wheezing.  Musculoskeletal:        General: No tenderness.  Normal range of motion.  Skin:    General: Skin is warm and dry.     Findings: No rash.  Neurological:     Mental Status: She is alert and oriented to person, place, and time.     Coordination: Coordination normal.  Psychiatric:        Behavior: Behavior normal.     Assessment & Plan:   Problem List Items Addressed This Visit      Cardiovascular and Mediastinum   Essential hypertension   Relevant Orders   Bayer DCA Hb A1c Waived   Chronic systolic CHF (congestive heart failure) (HCC)     Respiratory   Chronic obstructive pulmonary disease (HCC)     Endocrine   Type 2 diabetes mellitus with peripheral neuropathy ([place]) - Primary   Relevant Orders   Bayer DCA Hb A1c Waived     Other   Upper airway cough syndrome    Other Visit Diagnoses    Postmenopausal       Relevant Orders   DG [facility] DEXA      A1c up slightly but did have some prednisone at nighttime.,  7.7, no change in medication for now but will monitor.  Continue with the Trulicity and the [place].  Still having cough but still on antibiotic, recommended doing Mucinex and using the Flonase more frequently.   Follow up plan: Return in about 3 months (around 12/19/2020), or if symptoms worsen or fail to improve, for Diabetes and hypertension  recheck.  Counseling provided for all of the vaccine components Orders Placed This Encounter  Procedures  . Bayer [facility] Hb A1c [place], MD [facility] 09/18/2020, 1:16 PM

## 2020-09-22 DIAGNOSIS — M85852 Other specified disorders of bone density and structure, left thigh: Secondary | ICD-10-CM | POA: Diagnosis not present

## 2020-09-22 DIAGNOSIS — Z780 Asymptomatic menopausal state: Secondary | ICD-10-CM | POA: Diagnosis not present

## 2020-09-25 ENCOUNTER — Encounter: Payer: Self-pay | Admitting: Family Medicine

## 2020-09-25 ENCOUNTER — Ambulatory Visit (INDEPENDENT_AMBULATORY_CARE_PROVIDER_SITE_OTHER): Payer: Medicare HMO | Admitting: Family Medicine

## 2020-09-25 DIAGNOSIS — J441 Chronic obstructive pulmonary disease with (acute) exacerbation: Secondary | ICD-10-CM

## 2020-09-25 MED ORDER — DOXYCYCLINE HYCLATE 100 MG PO TABS: 100.0000 mg | ORAL_TABLET | Freq: Two times a day (BID) | ORAL | 0 refills | Status: AC

## 2020-09-25 NOTE — Progress Notes (Signed)
Virtual Visit via Telephone Note  I connected with Susan Patel on 09/25/20 at 9:40 AM by telephone and verified that I am speaking with the correct person using two identifiers. Susan Patel is currently located at home and her husband is currently with her during this visit. The provider, Loman Brooklyn, FNP is located in their office at time of visit.  I discussed the limitations, risks, security and privacy concerns of performing an evaluation and management service by telephone and the availability of in person appointments. I also discussed with the patient that there may be a patient responsible charge related to this service. The patient expressed understanding and agreed to proceed.  Subjective: PCP: Dettinger, Fransisca Kaufmann, MD  Chief Complaint  Patient presents with  . Cough   Patient complains of a nonproductive cough. Additional symptoms include runny nose, sneezing, postnasal drainage, shortness of breath, wheezing and itchy watery eyes. Onset of symptoms was a few days ago, gradually worsening since that time. She is drinking moderate amounts of fluids. Evaluation to date: Previous televisit and treated for COPD exacerbation which improved symptoms. Treatment to date: antihistamines, nasal steroids and Singulair. She has a history of COPD. She does not smoke.    ROS: Per HPI  Current Outpatient Medications:  .  acetaminophen (TYLENOL) 325 MG tablet, Take 2 tablets (650 mg total) by mouth every 6 (six) hours as needed., Disp: 30 tablet, Rfl: 0 .  albuterol (VENTOLIN HFA) 108 (90 Base) MCG/ACT inhaler, INHALE 1-2 PUFFS BY MOUTH EVERY 6 HOURS AS NEEDED FOR WHEEZE OR SHORTNESS OF BREATH, Disp: 18 each, Rfl: 3 .  carvedilol (COREG) 12.5 MG tablet, Take 1 tablet (12.5 mg total) by mouth 2 (two) times daily., Disp: 180 tablet, Rfl: 3 .  CVS ASPIRIN LOW STRENGTH 81 MG EC tablet, TAKE 1 TABLET (81 MG TOTAL) BY MOUTH DAILY FOR 90 DAYS., Disp: 90 tablet, Rfl: 1 .  Dulaglutide  (TRULICITY) 5.95 GL/8.7FI SOPN, Inject 0.75 mg into the skin once a week., Disp: 6 mL, Rfl: 3 .  famotidine (PEPCID) 20 MG tablet, One after supper, Disp: 30 tablet, Rfl: 11 .  fluticasone (FLONASE) 50 MCG/ACT nasal spray, Place 1 spray into both nostrils 2 (two) times daily as needed for allergies or rhinitis., Disp: 16 g, Rfl: 2 .  glucose blood test strip, Use as instructed, Disp: 100 each, Rfl: 2 .  insulin degludec (TRESIBA FLEXTOUCH) 100 UNIT/ML FlexTouch Pen, Inject 10 Units into the skin daily., Disp: 15 mL, Rfl: 3 .  Insulin Pen Needle (B-D UF III MINI PEN NEEDLES) 31G X 5 MM MISC, 1 each by Does not apply route daily., Disp: 90 each, Rfl: 3 .  isosorbide mononitrate (IMDUR) 30 MG 24 hr tablet, TAKE 1 TABLET BY MOUTH EVERY DAY, Disp: 90 tablet, Rfl: 3 .  linaclotide (LINZESS) 290 MCG CAPS capsule, Take 1 capsule (290 mcg total) by mouth daily before breakfast., Disp: 90 capsule, Rfl: 3 .  methocarbamol (ROBAXIN) 750 MG tablet, Take 1 tablet (750 mg total) by mouth at bedtime as needed., Disp: 90 tablet, Rfl: 1 .  montelukast (SINGULAIR) 10 MG tablet, Take 1 tablet (10 mg total) by mouth daily., Disp: 90 tablet, Rfl: 3 .  nitroGLYCERIN (NITROSTAT) 0.4 MG SL tablet, Place 1 tablet (0.4 mg total) under the tongue every 5 (five) minutes as needed., Disp: 25 tablet, Rfl: 0 .  ONETOUCH DELICA LANCETS FINE MISC, TEST TWICE DAILY, Disp: 100 each, Rfl: 9 .  OXYGEN, Inhale 3-4 L  into the lungs at bedtime., Disp: , Rfl:  .  pantoprazole (PROTONIX) 40 MG tablet, TAKE 1 TABLET (40 MG TOTAL) BY MOUTH DAILY. TAKE 30-60 MIN BEFORE FIRST MEAL OF THE DAY, Disp: 30 tablet, Rfl: 2 .  rosuvastatin (CRESTOR) 20 MG tablet, Take 1 tablet (20 mg total) by mouth daily., Disp: 90 tablet, Rfl: 1 .  traZODone (DESYREL) 50 MG tablet, Take 1 tablet (50 mg total) by mouth at bedtime as needed for sleep., Disp: 90 tablet, Rfl: 3 .  vitamin B-12 (CYANOCOBALAMIN) 1000 MCG tablet, Take 1,000 mcg by mouth daily., Disp: , Rfl:    Allergies  Allergen Reactions  . Azithromycin Other (See Comments)    Renal failure  . Prednisone Other (See Comments)    Irritability and insomnia  . Sulfamethoxazole-Trimethoprim Other (See Comments)    Renal failure  . Amlodipine Swelling    Caused swelling in legs  . Ciprofloxacin Itching and Rash  . Tape Rash   Past Medical History:  Diagnosis Date  . Arthritis    'all over"  . Asthma   . CAD (coronary artery disease)    Multivessel CAD, not a candidate for CABG, managing medically.  . Chronic pain   . COPD (chronic obstructive pulmonary disease) (HCC)    O2 per [place] at nights   . Depression   . Diabetes mellitus without complication ([place])    borderline- , states she was on med., but MD told her "everything is under control so I threw the bottle away"  . Fibromyalgia   . GERD (gastroesophageal reflux disease)   . Hypertension   . Neuromuscular disorder (HCC)    parkinson, neuropathy- both feet & hands.  . 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 COPD exacerbation ([place]) Advised to follow-up with pulmonology.  - doxycycline (VIBRA-TABS) 100 MG tablet; Take 1 tablet (100 mg total) by mouth 2 (two) times daily for 5 days.  Dispense: 10 tablet; Refill: 0   Follow Up Instructions:  I discussed the assessment and treatment plan with the patient. The patient was provided an opportunity to ask questions and all were answered. The patient agreed with the plan and demonstrated an understanding of the instructions.   The patient was advised to call back or seek an in-person evaluation if the symptoms worsen or if the condition fails to improve as anticipated.  The above assessment and management plan was discussed with the patient. The patient verbalized understanding of and has agreed to the management plan.  Patient is aware to call the clinic if symptoms persist or worsen. Patient is aware when to return to the clinic for a follow-up visit. Patient educated on when it is appropriate to go to the emergency department.   Time call ended: 9:52 AM  I provided 12 minutes of non-face-to-face time during this encounter.  Hendricks Limes, MSN, APRN, FNP-C [facility] 09/25/20

## 2020-12-03 ENCOUNTER — Other Ambulatory Visit: Payer: Self-pay | Admitting: Internal Medicine

## 2020-12-03 DIAGNOSIS — R058 Other specified cough: Secondary | ICD-10-CM

## 2020-12-19 ENCOUNTER — Ambulatory Visit: Payer: Medicare HMO | Admitting: Family Medicine

## 2020-12-31 ENCOUNTER — Other Ambulatory Visit: Payer: Self-pay | Admitting: Family Medicine

## 2021-01-05 ENCOUNTER — Other Ambulatory Visit: Payer: Self-pay | Admitting: Internal Medicine

## 2021-01-05 ENCOUNTER — Other Ambulatory Visit: Payer: Self-pay | Admitting: Family Medicine

## 2021-01-05 DIAGNOSIS — R058 Other specified cough: Secondary | ICD-10-CM

## 2021-01-13 ENCOUNTER — Telehealth: Payer: Self-pay | Admitting: Family Medicine

## 2021-01-13 ENCOUNTER — Other Ambulatory Visit: Payer: Self-pay

## 2021-01-13 DIAGNOSIS — E1142 Type 2 diabetes mellitus with diabetic polyneuropathy: Secondary | ICD-10-CM

## 2021-01-13 MED ORDER — TRULICITY 0.75 MG/0.5ML ~~LOC~~ SOAJ: 0.7500 mg | SUBCUTANEOUS | 3 refills | Status: DC

## 2021-01-13 NOTE — Telephone Encounter (Signed)
Husband made aware that we do not have any samples at this time.  Requested [facility] an [facility]. Refills sent.  Created referral to CCM. Made aware that we would call him with an appt date and time.

## 2021-01-13 NOTE — Telephone Encounter (Signed)
Pt needs samples of Trulicity  by 9/2 - please call back to let them know if we have any.

## 2021-01-15 ENCOUNTER — Other Ambulatory Visit: Payer: Self-pay

## 2021-01-15 ENCOUNTER — Encounter: Payer: Self-pay | Admitting: Family Medicine

## 2021-01-15 ENCOUNTER — Ambulatory Visit (INDEPENDENT_AMBULATORY_CARE_PROVIDER_SITE_OTHER): Payer: Medicare HMO | Admitting: Family Medicine

## 2021-01-15 VITALS — BP 170/83 | HR 68 | Ht 61.0 in | Wt 199.0 lb

## 2021-01-15 DIAGNOSIS — E1142 Type 2 diabetes mellitus with diabetic polyneuropathy: Secondary | ICD-10-CM | POA: Diagnosis not present

## 2021-01-15 DIAGNOSIS — I10 Essential (primary) hypertension: Secondary | ICD-10-CM | POA: Diagnosis not present

## 2021-01-15 DIAGNOSIS — K2100 Gastro-esophageal reflux disease with esophagitis, without bleeding: Secondary | ICD-10-CM

## 2021-01-15 LAB — BAYER DCA HB A1C WAIVED: HB A1C (BAYER DCA - WAIVED): 6.3 % (ref <7.0)

## 2021-01-15 NOTE — Progress Notes (Signed)
BP (!) 170/83   Pulse 68   Ht 5' 1" (1.549 m)   Wt 199 lb (90.3 kg)   SpO2 99%   BMI 37.60 kg/m    Subjective:   Patient ID: Susan Patel, female    DOB: June 30, 1940, 80 y.o.   MRN: [id number]  HPI: Susan Patel is a 80 y.o. female presenting on 01/15/2021 for Medical Management of Chronic Issues and Hypertension   HPI Type 2 diabetes mellitus Patient comes in today for recheck of his diabetes. Patient has been currently taking trulicity and Tresiba, W0J looks good at 6.3. Patient is not currently on an ACE inhibitor/ARB. Patient has not seen an ophthalmologist this year. Patient denies any issues with their feet. The symptom started onset as an adult hypertension and GERD ARE RELATED TO DM   Hypertension and CHF Patient is currently on carvedilol and Imdur, and their blood pressure today is 168/83, this has been running better at home, will call in some numbers over 2 weeks. Patient denies any lightheadedness or dizziness. Patient denies headaches, blurred vision, chest pains, shortness of breath, or weakness. Denies any side effects from medication and is content with current medication.   GERD Patient is currently on pantoprazole.  She denies any major symptoms or abdominal pain or belching or burping. She denies any blood in her stool or lightheadedness or dizziness.   Relevant past medical, surgical, family and social history reviewed and updated as indicated. Interim medical history since our last visit reviewed. Allergies and medications reviewed and updated.  Review of Systems  Constitutional:  Negative for fever.  Eyes:  Negative for redness and visual disturbance.  Respiratory:  Negative for chest tightness and shortness of breath.   Cardiovascular:  Negative for chest pain and leg swelling.  Musculoskeletal:  Negative for back pain and gait problem.  Skin:  Negative for rash.  Neurological:  Negative for light-headedness and headaches.  Psychiatric/Behavioral:   Negative for agitation and behavioral problems.   All other systems reviewed and are negative.  Per HPI unless specifically indicated above   Allergies as of 01/15/2021       Reactions   Azithromycin Other (See Comments)   Renal failure   Prednisone Other (See Comments)   Irritability and insomnia   Sulfamethoxazole-trimethoprim Other (See Comments)   Renal failure   Amlodipine Swelling   Caused swelling in legs   Ciprofloxacin Itching, Rash   Tape Rash        Medication List        Accurate as of January 15, 2021 11:36 AM. If you have any questions, ask your nurse or doctor.          acetaminophen 325 MG tablet Commonly known as: Tylenol Take 2 tablets (650 mg total) by mouth every 6 (six) hours as needed.   albuterol 108 (90 Base) MCG/ACT inhaler Commonly known as: VENTOLIN HFA INHALE 1-2 PUFFS BY MOUTH EVERY 6 HOURS AS NEEDED FOR WHEEZE OR SHORTNESS OF BREATH   aspirin 81 MG EC tablet TAKE 1 TABLET (81 MG TOTAL) BY MOUTH DAILY FOR 90 DAYS.   B-D UF III MINI PEN NEEDLES 31G X 5 MM Misc Generic drug: Insulin Pen Needle 1 each by Does not apply route daily.   carvedilol 12.5 MG tablet Commonly known as: COREG Take 1 tablet (12.5 mg total) by mouth 2 (two) times daily.   famotidine 20 MG tablet Commonly known as: Pepcid One after supper   fluticasone 50 MCG/ACT nasal  spray Commonly known as: FLONASE Place 1 spray into both nostrils 2 (two) times daily as needed for allergies or rhinitis.   glucose blood test strip Use as instructed   isosorbide mononitrate 30 MG 24 hr tablet Commonly known as: IMDUR TAKE 1 TABLET BY MOUTH EVERY DAY   linaclotide 290 MCG Caps capsule Commonly known as: Linzess Take 1 capsule (290 mcg total) by mouth daily before breakfast.   methocarbamol 750 MG tablet Commonly known as: ROBAXIN Take 1 tablet (750 mg total) by mouth at bedtime as needed.   montelukast 10 MG tablet Commonly known as: SINGULAIR Take 1 tablet (10  mg total) by mouth daily.   nitroGLYCERIN 0.4 MG SL tablet Commonly known as: NITROSTAT Place 1 tablet (0.4 mg total) under the tongue every 5 (five) minutes as needed.   OneTouch Delica Lancets Fine Misc TEST TWICE DAILY   OXYGEN Inhale 3-4 L into the lungs at bedtime.   pantoprazole 40 MG tablet Commonly known as: PROTONIX TAKE 1 TABLET (40 MG TOTAL) BY MOUTH DAILY. TAKE 30-60 MIN BEFORE FIRST MEAL OF THE DAY   rosuvastatin 20 MG tablet Commonly known as: CRESTOR TAKE 1 TABLET BY MOUTH EVERY DAY   traZODone 50 MG tablet Commonly known as: DESYREL Take 1 tablet (50 mg total) by mouth at bedtime as needed for sleep.   Tyler Aas FlexTouch 100 UNIT/ML FlexTouch Pen Generic drug: insulin degludec Inject 10 Units into the skin daily.   Trulicity 1.93 XT/0.2IO Sopn Generic drug: Dulaglutide Inject 0.75 mg into the skin once a week.   vitamin B-12 1000 MCG tablet Commonly known as: CYANOCOBALAMIN Take 1,000 mcg by mouth daily.         Objective:   BP (!) 170/83   Pulse 68   Ht $R'5\' 1"'Lm$  (1.549 m)   Wt 199 lb (90.3 kg)   SpO2 99%   BMI 37.60 kg/m   Wt Readings from Last 3 Encounters:  01/15/21 199 lb (90.3 kg)  09/18/20 202 lb (91.6 kg)  06/25/20 197 lb (89.4 kg)    Physical Exam Vitals and nursing note reviewed.  Constitutional:      General: She is not in acute distress.    Appearance: She is well-developed. She is not diaphoretic.  Eyes:     Conjunctiva/sclera: Conjunctivae normal.  Cardiovascular:     Rate and Rhythm: Normal rate and regular rhythm.     Heart sounds: Normal heart sounds. No murmur heard. Pulmonary:     Effort: Pulmonary effort is normal. No respiratory distress.     Breath sounds: Normal breath sounds. No wheezing.  Musculoskeletal:        General: No tenderness. Normal range of motion.  Skin:    General: Skin is warm and dry.     Findings: No rash.  Neurological:     Mental Status: She is alert and oriented to person, place, and time.      Coordination: Coordination normal.  Psychiatric:        Behavior: Behavior normal.    Assessment & Plan:   Problem List Items Addressed This Visit       Cardiovascular and Mediastinum   Essential hypertension   Relevant Orders   CBC with Differential/Platelet   CMP14+EGFR   Lipase   Bayer DCA Hb A1c Waived     Digestive   Gastro-esophageal reflux disease with esophagitis     Endocrine   Type 2 diabetes mellitus with peripheral neuropathy ([place]) - Primary   Relevant Orders  CBC with Differential/Platelet   CMP14+EGFR   Lipase   Bayer DCA Hb A1c Waived    Patient's hemoglobin A1c was good at 6.3 Follow up plan: Return in about 6 months (around 07/15/2021), or if symptoms worsen or fail to improve, for Diabetes and hypertension.  Counseling provided for all of the vaccine components Orders Placed This Encounter  Procedures   CBC with Differential/Platelet   CMP14+EGFR   Lipase   Bayer DCA Hb A1c Waived    Caryl Pina, MD [facility] 01/15/2021, 11:36 AM

## 2021-01-16 LAB — CMP14+EGFR
ALT: 10 IU/L (ref 0–32)
AST: 15 IU/L (ref 0–40)
Albumin/Globulin Ratio: 1.5 (ref 1.2–2.2)
Albumin: 4.1 g/dL (ref 3.7–4.7)
Alkaline Phosphatase: 143 IU/L — ABNORMAL HIGH (ref 44–121)
BUN/Creatinine Ratio: 20 (ref 12–28)
BUN: 21 mg/dL (ref 8–27)
Bilirubin Total: 0.4 mg/dL (ref 0.0–1.2)
CO2: 22 mmol/L (ref 20–29)
Calcium: 9.1 mg/dL (ref 8.7–10.3)
Chloride: 105 mmol/L (ref 96–106)
Creatinine, Ser: 1.07 mg/dL — ABNORMAL HIGH (ref 0.57–1.00)
Globulin, Total: 2.7 g/dL (ref 1.5–4.5)
Glucose: 106 mg/dL — ABNORMAL HIGH (ref 65–99)
Potassium: 4.7 mmol/L (ref 3.5–5.2)
Sodium: 143 mmol/L (ref 134–144)
Total Protein: 6.8 g/dL (ref 6.0–8.5)
eGFR: 53 mL/min/{1.73_m2} — ABNORMAL LOW (ref >59)

## 2021-01-16 LAB — CBC WITH DIFFERENTIAL/PLATELET
Basophils Absolute: 0.1 10*3/uL (ref 0.0–0.2)
Basos: 1 %
EOS (ABSOLUTE): 0.2 10*3/uL (ref 0.0–0.4)
Eos: 3 %
Hematocrit: 40.4 % (ref 34.0–46.6)
Hemoglobin: 13.7 g/dL (ref 11.1–15.9)
Immature Grans (Abs): 0 10*3/uL (ref 0.0–0.1)
Immature Granulocytes: 0 %
Lymphocytes Absolute: 1.3 10*3/uL (ref 0.7–3.1)
Lymphs: 20 %
MCH: 29.7 pg (ref 26.6–33.0)
MCHC: 33.9 g/dL (ref 31.5–35.7)
MCV: 87 fL (ref 79–97)
Monocytes Absolute: 0.6 10*3/uL (ref 0.1–0.9)
Monocytes: 9 %
Neutrophils Absolute: 4.2 10*3/uL (ref 1.4–7.0)
Neutrophils: 67 %
Platelets: 246 10*3/uL (ref 150–450)
RBC: 4.62 x10E6/uL (ref 3.77–5.28)
RDW: 12.7 % (ref 11.7–15.4)
WBC: 6.4 10*3/uL (ref 3.4–10.8)

## 2021-01-16 LAB — LIPASE: Lipase: 18 U/L (ref 14–85)

## 2021-01-22 ENCOUNTER — Telehealth: Payer: Self-pay

## 2021-01-22 NOTE — Chronic Care Management (AMB) (Signed)
  Chronic Care Management   Note  01/22/2021 Name: Susan Patel MRN: [id number] DOB: 1941-01-11  Susan Patel is a 80 y.o. year old female who is a primary care patient of Dettinger, Fransisca Kaufmann, MD. Susan Patel is currently enrolled in care management services. An additional referral for Pharm D  was placed.   Follow up plan: Telephone appointment with care management team member scheduled for:01/30/2021  Noreene Larsson, [place], [place], [place] [address] Direct Dial: [phone] [email or web address] Website: [email or web address]

## 2021-01-30 ENCOUNTER — Telehealth: Payer: Medicare HMO | Admitting: Pharmacist

## 2021-02-17 ENCOUNTER — Telehealth: Payer: Self-pay

## 2021-02-17 NOTE — Chronic Care Management (AMB) (Signed)
  Care Management   Note  02/17/2021 Name: Susan Patel MRN: [id number] DOB: Dec 25, 1940  Susan Patel is a 80 y.o. year old female who is a primary care patient of Dettinger, Fransisca Kaufmann, MD and is actively engaged with the care management team. I reached out to Susan Patel by phone today to assist with re-scheduling an initial visit with the Pharmacist  Follow up plan: Telephone appointment with care management team member scheduled for:03/20/2021  Susan Patel, [place], [place], [place] [address] Direct Dial: [phone] [email or web address] Website: [email or web address]

## 2021-02-17 NOTE — Chronic Care Management (AMB) (Signed)
  Care Management   Note  02/17/2021 Name: Susan Patel MRN: [id number] DOB: 1940-09-17  Susan Patel is a 80 y.o. year old female who is a primary care patient of Dettinger, Fransisca Kaufmann, MD and is actively engaged with the care management team. I reached out to [name] by phone today to assist with re-scheduling an initial visit with the Pharmacist  Follow up plan: Unsuccessful telephone outreach attempt made. A HIPAA compliant phone message was left for the patient providing contact information and requesting a return call.  The care management team will reach out to the patient again over the next 7 days.  If patient returns call to provider office, please advise to call [facility]  at [place], [place], [place], [place] [address] Direct Dial: [phone] [email or web address] Website: [email or web address]

## 2021-03-06 ENCOUNTER — Other Ambulatory Visit: Payer: Self-pay | Admitting: Internal Medicine

## 2021-03-06 DIAGNOSIS — R058 Other specified cough: Secondary | ICD-10-CM

## 2021-03-20 ENCOUNTER — Ambulatory Visit (INDEPENDENT_AMBULATORY_CARE_PROVIDER_SITE_OTHER): Payer: Medicare HMO | Admitting: Pharmacist

## 2021-03-20 DIAGNOSIS — E1142 Type 2 diabetes mellitus with diabetic polyneuropathy: Secondary | ICD-10-CM

## 2021-03-20 DIAGNOSIS — E782 Mixed hyperlipidemia: Secondary | ICD-10-CM

## 2021-04-01 NOTE — Progress Notes (Signed)
Chronic Care Management Pharmacy Note  03/20/2021 Name:  TAJANAE Patel MRN:  [id number] DOB:  [birth year]  Summary: T2DM, HTN  Recommendations/Changes made from today's visit: Current Barriers:  Unable to independently afford treatment regimen Unable to achieve control of T2DM   Pharmacist Clinical Goal(s):  patient will verbalize ability to afford treatment regimen achieve control of T2DM as evidenced by GOAL A1C, IMPROVED GLYCEMIC CONTROL maintain control of T2DM as evidenced by GOAL A1C, IMPROVED GLYCEMIC CONTROL  through collaboration with PharmD and provider.   Interventions: 1:1 collaboration with Dettinger, Fransisca Kaufmann, MD regarding development and update of comprehensive plan of care as evidenced by provider attestation and co-signature Inter-disciplinary care team collaboration (see longitudinal plan of care) Comprehensive medication review performed; medication list updated in electronic medical record  Diabetes: New goal. Controlled-A1C 6.3%; current treatment:TRESIBA 10 UNITS, TRULICITY 9.43EX SQ WEEKLY;  Patient unable to afford and stay on GLP1 therapy--->will assist with patient assistance via [employer] Denies personal and family history of Medullary thyroid cancer (MTC) CONTINUE CURRENT THERAPY Current glucose readings: fasting glucose: <130, post prandial glucose: n/a Denies hypoglycemic/hyperglycemic symptoms Discussed meal planning options and Plate method for healthy eating Avoid sugary drinks and desserts Incorporate balanced protein, non starchy veggies, 1 serving of carbohydrate with each meal Increase water intake Increase physical activity as able Current exercise: n/a Educated on glp1, patient assistance, medications Collaborated with PCP on application Assessed patient finances. Application submitted to [employer] patient assistance program  Hypertension:  New goal. uncontrolled; current treatment:carvedilol, isosorbide Current home readings:  [id number] systolics Denies hypotensive/hypertensive symptoms Educated on BP goal <130/80--taking medications as prescribed Recommended continue keeping BP log and will address at follow up   Patient Goals/Self-Care Activities patient will:  - take medications as prescribed as evidenced by patient report and record review check blood pressure daily, document, and provide at future appointments collaborate with provider on medication access solutions  Plan:  Subjective: ASIYAH PINEAU is an 80 y.o. year old female who is a primary patient of Dettinger, Fransisca Kaufmann, MD.  The CCM team was consulted for assistance with disease management and care coordination needs.    Engaged with patient by telephone for initial visit in response to provider referral for pharmacy case management and/or care coordination services.   Consent to Services:  The patient was given information about Chronic Care Management services, agreed to services, and gave verbal consent prior to initiation of services.  Please see initial visit note for detailed documentation.   Patient Care Team: Dettinger, Fransisca Kaufmann, MD as PCP - General (Family Medicine) Herminio Commons, MD (Inactive) as PCP - Cardiology (Cardiology) Gala Romney Cristopher Estimable, MD as Consulting Physician (Gastroenterology) Lavera Guise, [facility] as Pharmacist (Family Medicine)   Objective:  Lab Results  Component Value Date   CREATININE 1.07 (H) 01/15/2021   CREATININE 0.97 06/25/2020   CREATININE 0.86 11/22/2019    Lab Results  Component Value Date   HGBA1C 6.3 01/15/2021   Last diabetic Eye exam:  Lab Results  Component Value Date/Time   HMDIABEYEEXA No Retinopathy 05/05/2015 12:00 AM    Last diabetic Foot exam: No results found for: HMDIABFOOTEX      Component Value Date/Time   CHOL 204 (H) 06/25/2020 1332   TRIG 137 06/25/2020 1332   HDL 48 06/25/2020 1332   CHOLHDL 4.3 06/25/2020 1332   CHOLHDL 6.0 (H) 05/19/2018 1151   VLDL 21  10/10/2016 0141   LDLCALC 131 (H) 06/25/2020 1332   [place]man  180 (H) 05/19/2018 1151    Hepatic Function Latest Ref Rng & Units 01/15/2021 06/25/2020 11/22/2019  Total Protein 6.0 - 8.5 g/dL 6.8 6.6 6.6  Albumin 3.7 - 4.7 g/dL 4.1 4.2 3.6  AST 0 - 40 IU/L _0 ALT 0 - 32 IU/L _1 Alk Phosphatase 44 - 121 IU/L 143(H) 147(H) 93  Total Bilirubin 0.0 - 1.2 mg/dL 0.4 0.4 0.6    Lab Results  Component Value Date/Time   TSH 3.283 08/20/2018 12:23 AM   TSH 3.699 03/22/2016 06:31 AM    CBC Latest Ref Rng & Units 01/15/2021 06/25/2020 05/13/2020  WBC 3.4 - 10.8 x10E3/uL 6.4 6.2 7.3  Hemoglobin 11.1 - 15.9 g/dL 13.7 13.6 13.3  Hematocrit 34.0 - 46.6 % 40.4 41.3 41.6  Platelets 150 - 450 x10E3/uL 246 278 267    Lab Results  Component Value Date/Time   VD25OH 35.8 05/03/2017 11:15 AM    Clinical ASCVD: Yes  The ASCVD Risk score (Arnett DK, et al., 2019) failed to calculate for the following reasons:   The patient has a prior MI or stroke diagnosis    Other: (CHADS2VASc if Afib, PHQ9 if depression, MMRC or CAT for COPD, ACT, DEXA)  Social History   Tobacco Use  Smoking Status Never  Smokeless Tobacco Never   BP Readings from Last 3 Encounters:  01/15/21 (!) 170/83  09/18/20 (!) 149/72  09/11/20 (!) 148/77   Pulse Readings from Last 3 Encounters:  01/15/21 68  09/18/20 79  09/11/20 78   Wt Readings from Last 3 Encounters:  01/15/21 199 lb (90.3 kg)  09/18/20 202 lb (91.6 kg)  06/25/20 197 lb (89.4 kg)    Assessment: Review of patient past medical history, allergies, medications, health status, including review of consultants reports, laboratory and other test data, was performed as part of comprehensive evaluation and provision of chronic care management services.   SDOH:  (Social Determinants of Health) assessments and interventions performed:    CCM Care Plan  Allergies  Allergen Reactions   Azithromycin Other (See Comments)    Renal failure   Prednisone Other  (See Comments)    Irritability and insomnia   Sulfamethoxazole-Trimethoprim Other (See Comments)    Renal failure   Amlodipine Swelling    Caused swelling in legs   Ciprofloxacin Itching and Rash   Tape Rash    Medications Reviewed Today     Reviewed by Lavera Guise, [facility] (Pharmacist) on 04/07/21 at 1310  Med List Status: <None>   Medication Order Taking? Sig Documenting Provider Last Dose Status Informant  acetaminophen (TYLENOL) 325 MG tablet [id number] No Take 2 tablets (650 mg total) by mouth every 6 (six) hours as needed. Varney Biles, MD Taking Active Spouse/Significant Other  albuterol (VENTOLIN HFA) 108 (90 Base) MCG/ACT inhaler [id number] No INHALE 1-2 PUFFS BY MOUTH EVERY 6 HOURS AS NEEDED FOR WHEEZE OR SHORTNESS OF BREATH Dettinger, Fransisca Kaufmann, MD Taking Active   aspirin 81 MG EC tablet [id number] No TAKE 1 TABLET (81 MG TOTAL) BY MOUTH DAILY FOR 90 DAYS. Dettinger, Fransisca Kaufmann, MD Taking Active   carvedilol (COREG) 12.5 MG tablet [id number] No Take 1 tablet (12.5 mg total) by mouth 2 (two) times daily. Dettinger, Fransisca Kaufmann, MD Taking Active   Dulaglutide (TRULICITY) 7.01 ID/0.3UD SOPN [id number] No Inject 0.75 mg into the skin once a week. Dettinger, Fransisca Kaufmann, MD Taking Active   famotidine (PEPCID) 20 MG tablet [id number] No One after supper Tanda Rockers,  MD Taking Active   fluticasone (FLONASE) 50 MCG/ACT nasal spray [id number] No Place 1 spray into both nostrils 2 (two) times daily as needed for allergies or rhinitis. Dettinger, Fransisca Kaufmann, MD Taking Active   glucose blood test strip [id number] No Use as instructed Eustaquio Maize, MD Taking Active Spouse/Significant Other  insulin degludec (TRESIBA FLEXTOUCH) 100 UNIT/ML FlexTouch Pen [id number] No Inject 10 Units into the skin daily. Dettinger, Fransisca Kaufmann, MD Taking Active   Insulin Pen Needle (B-D UF III MINI PEN NEEDLES) 31G X 5 MM MISC [id number] No 1 each by Does not apply route daily. Dettinger, Fransisca Kaufmann, MD Taking Active  Spouse/Significant Other  isosorbide mononitrate (IMDUR) 30 MG 24 hr tablet [id number] No TAKE 1 TABLET BY MOUTH EVERY DAY Verta Ellen., NP Taking Active   linaclotide [facility]) 290 MCG CAPS capsule [id number] No Take 1 capsule (290 mcg total) by mouth daily before breakfast. Dettinger, Fransisca Kaufmann, MD Taking Active   methocarbamol (ROBAXIN) 750 MG tablet [id number] No Take 1 tablet (750 mg total) by mouth at bedtime as needed. Dettinger, Fransisca Kaufmann, MD Taking Active   montelukast (SINGULAIR) 10 MG tablet [id number] No Take 1 tablet (10 mg total) by mouth daily. Dettinger, Fransisca Kaufmann, MD Taking Active   nitroGLYCERIN (NITROSTAT) 0.4 MG SL tablet [id number] No Place 1 tablet (0.4 mg total) under the tongue every 5 (five) minutes as needed. Verta Ellen., NP Taking Active   [facility] LANCETS FINE MISC [id number] No TEST TWICE DAILY Eustaquio Maize, MD Taking Active Spouse/Significant Other  OXYGEN [id number] No Inhale 3-4 L into the lungs at bedtime. [provider] Taking Active Spouse/Significant Other  pantoprazole (PROTONIX) 40 MG tablet [id number]  TAKE 1 TABLET (40 MG TOTAL) BY MOUTH DAILY. TAKE 30-60 MIN BEFORE FIRST MEAL OF THE DAY Tanda Rockers, MD  Active   rosuvastatin (CRESTOR) 20 MG tablet [id number] No TAKE 1 TABLET BY MOUTH EVERY DAY Dettinger, Fransisca Kaufmann, MD Taking Active   traZODone (DESYREL) 50 MG tablet [id number] No Take 1 tablet (50 mg total) by mouth at bedtime as needed for sleep. Dettinger, Fransisca Kaufmann, MD Taking Active   vitamin B-12 (CYANOCOBALAMIN) 1000 MCG tablet [id number] No Take 1,000 mcg by mouth daily. 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   Contusion of left elbow 03/15/2017   Dysmetria    Supplemental oxygen dependent    Hypoalbuminemia due to protein-calorie malnutrition ([place])    Oropharyngeal dysphagia    Cognitive deficit due to recent stroke 04/23/2016   Frequent falls 04/21/2016   Type 2 diabetes mellitus with peripheral neuropathy (HCC)    Fibromyalgia    Chronic pain syndrome    Chronic obstructive pulmonary disease (HCC)    Gait disturbance, post-stroke    Dysarthria, post-stroke    Dysphagia, post-stroke    Mixed hyperlipidemia    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   Allergic rhinitis 12/17/2015   Primary osteoarthritis of both knees 11/19/2015   Obesity (BMI 30-39.9) 09/04/2015   Essential  hypertension 09/04/2015   Constipation 09/04/2015   Raynaud's phenomenon 08/26/2015   Gastro-esophageal reflux disease with esophagitis 08/26/2015   Spinal stenosis 08/26/2015   Vitamin D deficiency 08/26/2015   DDD (degenerative disc disease), cervical 07/10/2015   Cervical radicular pain 04/30/2013   Knee osteoarthritis 01/05/2012    Immunization History  Administered Date(s) Administered   Influenza Split 02/14/2018   Influenza, High Dose Seasonal PF 03/23/2017   Influenza,inj,Quad PF,6+ Mos 03/21/2016   Influenza-Unspecified 05/01/2012, 06/12/2013, 04/04/2014, 03/17/2018   Moderna Sars-Covid-2 Vaccination 07/19/2019, 08/24/2019, 06/12/2020   Pneumococcal Conjugate-13 04/04/2014   Pneumococcal Polysaccharide-23 04/25/2006, 03/27/2009   Tdap 05/31/2011    Conditions to be addressed/monitored: HTN and DMII  Care Plan : PHARMD MEDICATION MANAGEMENT  Updates made by Lavera Guise, [place] since 04/07/2021 12:00 AM     Problem: DISEASE PROGRESSION PREVENTION      Long-Range Goal: T2DM, HTN   This Visit's Progress: Not on track  Priority: High  Note:   Current Barriers:   Unable to independently afford treatment regimen Unable to achieve control of T2DM   Pharmacist Clinical Goal(s):  patient will verbalize ability to afford treatment regimen achieve control of T2DM as evidenced by GOAL A1C, IMPROVED GLYCEMIC CONTROL maintain control of T2DM as evidenced by GOAL A1C, IMPROVED GLYCEMIC CONTROL  through collaboration with PharmD and provider.   Interventions: 1:1 collaboration with Dettinger, Fransisca Kaufmann, MD regarding development and update of comprehensive plan of care as evidenced by provider attestation and co-signature Inter-disciplinary care team collaboration (see longitudinal plan of care) Comprehensive medication review performed; medication list updated in electronic medical record  Diabetes: New goal. Controlled-A1C 6.3%; current treatment:TRESIBA 10 UNITS, TRULICITY 1.76HY SQ WEEKLY;  Patient unable to afford and stay on GLP1 therapy--->will assist with patient assistance via lilly cares Denies personal and family history of Medullary thyroid cancer (MTC) CONTINUE CURRENT THERAPY Current glucose readings: fasting glucose: <130, post prandial glucose: n/a Denies hypoglycemic/hyperglycemic symptoms Discussed meal planning options and Plate method for healthy eating Avoid sugary drinks and desserts Incorporate balanced protein, non starchy veggies, 1 serving of carbohydrate with each meal Increase water intake Increase physical activity as able Current exercise: n/a Educated on glp1, patient assistance, medications Collaborated with PCP on application Assessed patient finances. Application submitted to [employer] cares patient assistance program  Hypertension:  New goal. uncontrolled; current treatment:carvedilol, isosorbide Current home readings: [phone] systolics Denies hypotensive/hypertensive symptoms Educated on BP goal <130/80--taking medications as prescribed Recommended continue keeping BP log and will address at follow up   Patient  Goals/Self-Care Activities patient will:  - take medications as prescribed as evidenced by patient report and record review check blood pressure daily, document, and provide at future appointments collaborate with provider on medication access solutions      Medication Assistance: Application for TRULICITY  medication assistance program. in process.  Anticipated assistance start date TBD.  See plan of care for additional detail.  Patient's [facility] pharmacy [facility]:  [id number] [address]Phone: [phone]: [phone] [address] [place]Phone: [phone]: [phone] Follow Up:  Patient agrees to Care Plan and Follow-up.  Plan: Telephone follow up appointment with care management team member scheduled for:  05/06/21    JRegina Eck PharmD, BCPS Clinical Pharmacist, [place] Phone [phone]

## 2021-04-01 NOTE — Patient Instructions (Addendum)
Visit Information  Current Barriers:  Unable to independently afford treatment regimen Unable to achieve control of T2DM   Pharmacist Clinical Goal(s):  patient will verbalize ability to afford treatment regimen achieve control of T2DM as evidenced by GOAL A1C, IMPROVED GLYCEMIC CONTROL maintain control of T2DM as evidenced by GOAL A1C, IMPROVED GLYCEMIC CONTROL  through collaboration with PharmD and provider.   Interventions: 1:1 collaboration with Dettinger, Fransisca Kaufmann, MD regarding development and update of comprehensive plan of care as evidenced by provider attestation and co-signature Inter-disciplinary care team collaboration (see longitudinal plan of care) Comprehensive medication review performed; medication list updated in electronic medical record  Diabetes: New goal. Controlled-A1C 6.3%; current treatment:TRESIBA 10 UNITS, TRULICITY 0.75MG  SQ WEEKLY;  Patient unable to afford and stay on GLP1 therapy--->will assist with patient assistance via [employer] Denies personal and family history of Medullary thyroid cancer (MTC) CONTINUE CURRENT THERAPY Current glucose readings: fasting glucose: <130, post prandial glucose: n/a Denies hypoglycemic/hyperglycemic symptoms Discussed meal planning options and Plate method for healthy eating Avoid sugary drinks and desserts Incorporate balanced protein, non starchy veggies, 1 serving of carbohydrate with each meal Increase water intake Increase physical activity as able Current exercise: n/a Educated on glp1, patient assistance, medications Collaborated with PCP on application Assessed patient finances. Application submitted to [employer] patient assistance program  Hypertension:  New goal. uncontrolled; current treatment:carvedilol, isosorbide Current home readings: [id number] systolics Denies hypotensive/hypertensive symptoms Educated on BP goal <130/80--taking medications as prescribed Recommended continue keeping BP log and will  address at follow up   Patient Goals/Self-Care Activities patient will:  - take medications as prescribed as evidenced by patient report and record review check blood pressure daily, document, and provide at future appointments collaborate with provider on medication access solutions   The patient verbalized understanding of instructions, educational materials, and care plan provided today and declined offer to receive copy of patient instructions, educational materials, and care plan.   Telephone follow up appointment with care management team member scheduled for: 6 WEEKS   Regina Eck, PharmD, BCPS Clinical Pharmacist, [place]  II Phone [phone]

## 2021-04-07 NOTE — Progress Notes (Signed)
Received notification from [place] regarding approval for TRULICITY 0.75MG /0.5ML. Patient assistance approved from 03/27/21 to 05/16/22.  Medications will be shipped to pt's home from [place]

## 2021-04-14 ENCOUNTER — Encounter: Payer: Self-pay | Admitting: Family Medicine

## 2021-04-14 ENCOUNTER — Ambulatory Visit (INDEPENDENT_AMBULATORY_CARE_PROVIDER_SITE_OTHER): Payer: Medicare HMO | Admitting: Family Medicine

## 2021-04-14 VITALS — BP 167/81 | HR 94 | Temp 98.5°F | Resp 18

## 2021-04-14 DIAGNOSIS — J069 Acute upper respiratory infection, unspecified: Secondary | ICD-10-CM | POA: Diagnosis not present

## 2021-04-14 MED ORDER — DOXYCYCLINE HYCLATE 100 MG PO TABS: 100.0000 mg | ORAL_TABLET | Freq: Two times a day (BID) | ORAL | 0 refills | Status: AC

## 2021-04-14 NOTE — Progress Notes (Signed)
Subjective:  Patient ID: Susan Patel, female    DOB: 1941-02-07, 80 y.o.   MRN: [id number]  Patient Care Team: Dettinger, Fransisca Kaufmann, MD as PCP - General (Family Medicine) Herminio Commons, MD (Inactive) as PCP - Cardiology (Cardiology) Gala Romney Cristopher Estimable, MD as Consulting Physician (Gastroenterology) Lavera Guise, [facility] as Pharmacist (Family Medicine)   Chief Complaint:  flu-like symptoms   HPI: Susan Patel is a 80 y.o. female presenting on 04/14/2021 for flu-like symptoms   Pt presents today with husband with complaints of flu-like symptoms since Friday. States her son tested positive and they were with him last week. Reports cough, congestion, fever, chills, malaise, and headache. Has taken Tylenol with minimal relief of symptoms.    Relevant past medical, surgical, family, and social history reviewed and updated as indicated.  Allergies and medications reviewed and updated. Data reviewed: Chart in Epic.   Past Medical History:  Diagnosis Date   Arthritis    'all over"   Asthma    CAD (coronary artery disease)    Multivessel CAD, not a candidate for CABG, managing medically.   Chronic pain    COPD (chronic obstructive pulmonary disease) (HCC)    O2 per [place] at nights    Depression    Diabetes mellitus without complication (HCC)    borderline- , states she was on med., but MD told her "everything is under control so I threw the bottle away"   Fibromyalgia    GERD (gastroesophageal reflux disease)    Hypertension    Neuromuscular disorder ([place])    parkinson, neuropathy- both feet & hands.   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    Past Surgical History:  Procedure Laterality Date   ABDOMINAL HYSTERECTOMY     ANTERIOR CERVICAL DECOMP/DISCECTOMY FUSION N/A 07/10/2015   Procedure: Cervical five-six, Cervical six-seven anterior cervical decompression with fusion plating and bonegraft;  Surgeon: Jovita Gamma, MD;  Location: [place] [facility];  Service: Neurosurgery;   Laterality: N/A;   APPENDECTOMY     BIOPSY EYE MUSCLE  03/20/2016   biopsy vessel to right eye due to swelling   EYE SURGERY Bilateral    cataracts removed, /w "cyrstal lenses"    LEFT HEART CATH AND CORONARY ANGIOGRAPHY N/A 05/23/2018   Procedure: LEFT HEART CATH AND CORONARY ANGIOGRAPHY;  Surgeon: Troy Sine, MD;  Location: [place] [facility];  Service: Cardiovascular;  Laterality: N/A;   SHOULDER ARTHROSCOPY Right    x2   RCR- spurs removed     Social History   Socioeconomic History   Marital status: Married    Spouse name: wayne   Number of children: 2   Years of education: 16   Highest education level: Bachelor's degree (e.g., BA, AB, BS)  Occupational History   Occupation: retired    Comment: Automotive engineer  Tobacco Use   Smoking status: Never   Smokeless tobacco: Never  Vaping Use   Vaping Use: Never used  Substance and Sexual Activity   Alcohol use: No   Drug use: No   Sexual activity: Yes  Other Topics Concern   Not on file  Social History Narrative   Not on file   Social Determinants of Health   Financial Resource Strain: Low Risk    Difficulty of Paying Living Expenses: Not hard at all  Food Insecurity: No Food Insecurity   Worried About Charity fundraiser in the Last Year: Never true   Ran Out of  Food in the Last Year: Never true  Transportation Needs: No Transportation Needs   Lack of Transportation (Medical): No   Lack of Transportation (Non-Medical): No  Physical Activity: Inactive   Days of Exercise per Week: 0 days   Minutes of Exercise per Session: 0 min  Stress: No Stress Concern Present   Feeling of Stress : Only a little  Social Connections: Moderately Integrated   Frequency of Communication with Friends and Family: More than three times a week   Frequency of Social Gatherings with Friends and Family: Once a week   Attends Religious Services: More than 4 times per year   Active Member of [employer] or Organizations: No   Attends  Archivist Meetings: Never   Marital Status: Married  Human resources officer Violence: Not At Risk   Fear of Current or Ex-Partner: No   Emotionally Abused: No   Physically Abused: No   Sexually Abused: No    Outpatient Encounter Medications as of 04/14/2021  Medication Sig   acetaminophen (TYLENOL) 325 MG tablet Take 2 tablets (650 mg total) by mouth every 6 (six) hours as needed.   albuterol (VENTOLIN HFA) 108 (90 Base) MCG/ACT inhaler INHALE 1-2 PUFFS BY MOUTH EVERY 6 HOURS AS NEEDED FOR WHEEZE OR SHORTNESS OF BREATH   aspirin 81 MG EC tablet TAKE 1 TABLET (81 MG TOTAL) BY MOUTH DAILY FOR 90 DAYS.   carvedilol (COREG) 12.5 MG tablet Take 1 tablet (12.5 mg total) by mouth 2 (two) times daily.   doxycycline (VIBRA-TABS) 100 MG tablet Take 1 tablet (100 mg total) by mouth 2 (two) times daily for 10 days. 1 po bid   Dulaglutide (TRULICITY) 3.35 KT/6.2BW SOPN Inject 0.75 mg into the skin once a week.   famotidine (PEPCID) 20 MG tablet One after supper   fluticasone (FLONASE) 50 MCG/ACT nasal spray Place 1 spray into both nostrils 2 (two) times daily as needed for allergies or rhinitis.   glucose blood test strip Use as instructed   insulin degludec (TRESIBA FLEXTOUCH) 100 UNIT/ML FlexTouch Pen Inject 10 Units into the skin daily.   Insulin Pen Needle (B-D UF III MINI PEN NEEDLES) 31G X 5 MM MISC 1 each by Does not apply route daily.   isosorbide mononitrate (IMDUR) 30 MG 24 hr tablet TAKE 1 TABLET BY MOUTH EVERY DAY   linaclotide (LINZESS) 290 MCG CAPS capsule Take 1 capsule (290 mcg total) by mouth daily before breakfast.   methocarbamol (ROBAXIN) 750 MG tablet Take 1 tablet (750 mg total) by mouth at bedtime as needed.   montelukast (SINGULAIR) 10 MG tablet Take 1 tablet (10 mg total) by mouth daily.   nitroGLYCERIN (NITROSTAT) 0.4 MG SL tablet Place 1 tablet (0.4 mg total) under the tongue every 5 (five) minutes as needed.   ONETOUCH DELICA LANCETS FINE MISC TEST TWICE DAILY   OXYGEN  Inhale 3-4 L into the lungs at bedtime.   pantoprazole (PROTONIX) 40 MG tablet TAKE 1 TABLET (40 MG TOTAL) BY MOUTH DAILY. TAKE 30-60 MIN BEFORE FIRST MEAL OF THE DAY   rosuvastatin (CRESTOR) 20 MG tablet TAKE 1 TABLET BY MOUTH EVERY DAY   traZODone (DESYREL) 50 MG tablet Take 1 tablet (50 mg total) by mouth at bedtime as needed for sleep.   vitamin B-12 (CYANOCOBALAMIN) 1000 MCG tablet Take 1,000 mcg by mouth daily.   No facility-administered encounter medications on file as of 04/14/2021.    Allergies  Allergen Reactions   Azithromycin Other (See Comments)  Renal failure   Prednisone Other (See Comments)    Irritability and insomnia   Sulfamethoxazole-Trimethoprim Other (See Comments)    Renal failure   Amlodipine Swelling    Caused swelling in legs   Ciprofloxacin Itching and Rash   Tape Rash    Review of Systems  Constitutional:  Positive for activity change, appetite change, chills, fatigue and fever. Negative for diaphoresis and unexpected weight change.  HENT:  Positive for congestion, postnasal drip and rhinorrhea.   Respiratory:  Positive for cough. Negative for apnea, choking, chest tightness, shortness of breath, wheezing and stridor.   Cardiovascular:  Negative for chest pain.  Gastrointestinal:  Negative for abdominal pain.  Genitourinary:  Negative for decreased urine volume and difficulty urinating.  Musculoskeletal:  Positive for myalgias.  Neurological:  Negative for weakness.  All other systems reviewed and are negative.      Objective:  BP (!) 167/81   Pulse 94   Temp 98.5 F (36.9 C) (Skin)   Resp 18   SpO2 96%    Wt Readings from Last 3 Encounters:  01/15/21 199 lb (90.3 kg)  09/18/20 202 lb (91.6 kg)  06/25/20 197 lb (89.4 kg)    Physical Exam Cardiovascular:     Rate and Rhythm: Normal rate and regular rhythm.     Heart sounds: Normal heart sounds. No murmur heard.   No friction rub. No gallop.  Skin:    General: Skin is warm and dry.      Capillary Refill: Capillary refill takes less than 2 seconds.  Neurological:     Mental Status: Mental status is at baseline.    Results for orders placed or performed in visit on 01/15/21  CBC with Differential/Platelet  Result Value Ref Range   WBC 6.4 3.4 - 10.8 x10E3/uL   RBC 4.62 3.77 - 5.28 x10E6/uL   Hemoglobin 13.7 11.1 - 15.9 g/dL   Hematocrit 40.4 34.0 - 46.6 %   MCV 87 79 - 97 fL   MCH 29.7 26.6 - 33.0 pg   MCHC 33.9 31.5 - 35.7 g/dL   RDW 12.7 11.7 - 15.4 %   Platelets 246 150 - 450 x10E3/uL   Neutrophils 67 Not Estab. %   Lymphs 20 Not Estab. %   Monocytes 9 Not Estab. %   Eos 3 Not Estab. %   Basos 1 Not Estab. %   Neutrophils Absolute 4.2 1.4 - 7.0 x10E3/uL   Lymphocytes Absolute 1.3 0.7 - 3.1 x10E3/uL   Monocytes Absolute 0.6 0.1 - 0.9 x10E3/uL   EOS (ABSOLUTE) 0.2 0.0 - 0.4 x10E3/uL   Basophils Absolute 0.1 0.0 - 0.2 x10E3/uL   Immature Granulocytes 0 Not Estab. %   Immature Grans (Abs) 0.0 0.0 - 0.1 x10E3/uL  CMP14+EGFR  Result Value Ref Range   Glucose 106 (H) 65 - 99 mg/dL   BUN 21 8 - 27 mg/dL   Creatinine, Ser 1.07 (H) 0.57 - 1.00 mg/dL   eGFR 53 (L) >59 mL/min/1.73   BUN/Creatinine Ratio 20 12 - 28   Sodium 143 134 - 144 mmol/L   Potassium 4.7 3.5 - 5.2 mmol/L   Chloride 105 96 - 106 mmol/L   CO2 22 20 - 29 mmol/L   Calcium 9.1 8.7 - 10.3 mg/dL   Total Protein 6.8 6.0 - 8.5 g/dL   Albumin 4.1 3.7 - 4.7 g/dL   Globulin, Total 2.7 1.5 - 4.5 g/dL   Albumin/Globulin Ratio 1.5 1.2 - 2.2   Bilirubin Total 0.4 0.0 -  1.2 mg/dL   Alkaline Phosphatase 143 (H) 44 - 121 IU/L   AST 15 0 - 40 IU/L   ALT 10 0 - 32 IU/L  Lipase  Result Value Ref Range   Lipase 18 14 - 85 U/L  Bayer DCA Hb A1c Waived  Result Value Ref Range   HB A1C (BAYER DCA - WAIVED) 6.3 <7.0 %       Pertinent labs & imaging results that were available during my care of the patient were reviewed by me and considered in my medical decision making.  Assessment & Plan:  [place]  was seen today for flu-like symptoms.  Diagnoses and all orders for this visit:  URI with cough and congestion Influenza negative in office. Due to history of COPD and worsening URI symptoms, will add doxycycline for next 10 days. Continue albuterol inhaler as prescribed. Add Mucinex with plenty of water. Unable to burst with steroids as pt is allergie. Pt aware to report any new, worsening, or persistent symptoms. Symptoms over 72 hours so will not initiate Tamiflu.  -     Veritor Flu A/B Waived -     Novel Coronavirus, NAA ([facility]) -     doxycycline (VIBRA-TABS) 100 MG tablet; Take 1 tablet (100 mg total) by mouth 2 (two) times daily for 10 days. 1 po bid    Continue all other maintenance medications.  Follow up plan: Return in 2 weeks (on 04/28/2021), or if symptoms worsen or fail to improve.   Continue healthy lifestyle choices, including diet (rich in fruits, vegetables, and lean proteins, and low in salt and simple carbohydrates) and exercise (at least 30 minutes of moderate physical activity daily).  Educational handout given for URI  The above assessment and management plan was discussed with the patient. The patient verbalized understanding of and has agreed to the management plan. Patient is aware to call the clinic if they develop any new symptoms or if symptoms persist or worsen. Patient is aware when to return to the clinic for a follow-up visit. Patient educated on when it is appropriate to go to the emergency department.   Monia Pouch, FNP-C [facility] [phone]

## 2021-04-15 DIAGNOSIS — E1142 Type 2 diabetes mellitus with diabetic polyneuropathy: Secondary | ICD-10-CM

## 2021-04-15 DIAGNOSIS — E782 Mixed hyperlipidemia: Secondary | ICD-10-CM

## 2021-04-15 LAB — VERITOR FLU A/B WAIVED
Influenza A: NEGATIVE
Influenza B: NEGATIVE

## 2021-04-15 LAB — SARS-COV-2, NAA 2 DAY TAT

## 2021-04-15 LAB — NOVEL CORONAVIRUS, NAA: SARS-CoV-2, NAA: NOT DETECTED

## 2021-04-17 ENCOUNTER — Telehealth: Payer: Self-pay | Admitting: Family Medicine

## 2021-04-17 NOTE — Telephone Encounter (Signed)
Pts husband informed of Rakes's recommendations.   Pt is eating some and drinking. He is going to pick up Pedialyte today.   He is concerned that the pharmacy did not give enough of Doxy. Advised him to call pharmacy if #20 tabs were not given

## 2021-04-17 NOTE — Telephone Encounter (Signed)
Pt is not feeling any better. She had an appt on 11/19 and wants to know if anything else can be called in.

## 2021-04-17 NOTE — Telephone Encounter (Signed)
Appt 11/29 rakes.

## 2021-04-27 ENCOUNTER — Ambulatory Visit (INDEPENDENT_AMBULATORY_CARE_PROVIDER_SITE_OTHER): Payer: Medicare HMO

## 2021-04-27 VITALS — Ht 61.0 in | Wt 199.0 lb

## 2021-04-27 DIAGNOSIS — Z0000 Encounter for general adult medical examination without abnormal findings: Secondary | ICD-10-CM | POA: Diagnosis not present

## 2021-04-27 NOTE — Progress Notes (Signed)
Subjective:   Susan Patel is a 80 y.o. female who presents for Medicare Annual (Subsequent) preventive examination.  Virtual Visit via Telephone Note  I connected with  Susan Patel on 04/27/21 at  2:00 PM EST by telephone and verified that I am speaking with the correct person using two identifiers.  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 The patient expressed understanding and agreed to proceed.  Interactive audio and video telecommunications were attempted between this nurse and patient, however failed, due to patient having technical difficulties OR patient did not have access to video capability.  We continued and completed visit with audio only.  Some vital signs may be absent or patient reported.   Pranika Finks E Aalia Greulich, LPN   Review of Systems     Cardiac Risk Factors include: advanced age (>71men, >80 women);diabetes mellitus;sedentary lifestyle;obesity (BMI >30kg/m2);dyslipidemia;hypertension;Other (see comment), Risk factor comments: CHF, COPD, hx of stroke     Objective:    Today's Vitals   04/27/21 1325  Weight: 199 lb (90.3 kg)  Height: 5\' 1"  (1.549 m)   Body mass index is 37.6 kg/m.  Advanced Directives 04/27/2021 04/24/2020 11/22/2019 01/25/2019 08/20/2018 08/20/2018 02/23/2018  Does Patient Have a Medical Advance Directive? Yes No No No No No No  Type of Paramedic of [place];Living will - - - - - -  Does patient want to make changes to medical advance directive? - - - - - - -  Copy of [place] in Chart? No - copy requested - - - - - -  Would patient like information on creating a medical advance directive? - No - Patient declined No - Patient declined No -  Patient declined No - Patient declined No - Guardian declined -    Current Medications (verified) Outpatient Encounter Medications as of 04/27/2021  Medication Sig   acetaminophen (TYLENOL) 325 MG tablet Take 2 tablets (650 mg total) by mouth every 6 (six) hours as needed.   albuterol (VENTOLIN HFA) 108 (90 Base) MCG/ACT inhaler INHALE 1-2 PUFFS BY MOUTH EVERY 6 HOURS AS NEEDED FOR WHEEZE OR SHORTNESS OF BREATH   aspirin 81 MG EC tablet TAKE 1 TABLET (81 MG TOTAL) BY MOUTH DAILY FOR 90 DAYS.   carvedilol (COREG) 12.5 MG tablet Take 1 tablet (12.5 mg total) by mouth 2 (two) times daily.   Dulaglutide (TRULICITY) 6.26 RS/8.5IO SOPN Inject 0.75 mg into the skin once a week.   famotidine (PEPCID) 20 MG tablet One after supper   fluticasone (FLONASE) 50 MCG/ACT nasal spray Place 1 spray into both nostrils 2 (two) times daily as needed for allergies or rhinitis.   glucose blood test strip Use as instructed   insulin degludec (TRESIBA FLEXTOUCH) 100 UNIT/ML FlexTouch Pen Inject 10 Units into the skin daily.   Insulin Pen Needle (B-D UF III MINI PEN NEEDLES) 31G X 5 MM MISC 1 each by Does not apply route daily.   isosorbide mononitrate (IMDUR) 30 MG 24 hr tablet TAKE 1 TABLET BY MOUTH EVERY DAY   linaclotide (LINZESS) 290 MCG CAPS capsule Take 1 capsule (290 mcg total) by mouth daily before breakfast.   methocarbamol (ROBAXIN) 750 MG tablet Take 1 tablet (750 mg total) by mouth at bedtime as needed.   montelukast (SINGULAIR)  10 MG tablet Take 1 tablet (10 mg total) by mouth daily.   nitroGLYCERIN (NITROSTAT) 0.4 MG SL tablet Place 1 tablet (0.4 mg total) under the tongue every 5 (five) minutes as needed.   ONETOUCH DELICA LANCETS FINE MISC TEST TWICE DAILY   OXYGEN Inhale 3-4 L into the lungs at bedtime.   pantoprazole (PROTONIX) 40 MG tablet TAKE 1 TABLET (40 MG TOTAL) BY MOUTH DAILY. TAKE 30-60 MIN BEFORE FIRST MEAL OF THE DAY   rosuvastatin (CRESTOR) 20 MG tablet TAKE 1 TABLET BY MOUTH EVERY DAY    traZODone (DESYREL) 50 MG tablet Take 1 tablet (50 mg total) by mouth at bedtime as needed for sleep.   vitamin B-12 (CYANOCOBALAMIN) 1000 MCG tablet Take 1,000 mcg by mouth daily.   No facility-administered encounter medications on file as of 04/27/2021.    Allergies (verified) Azithromycin, Prednisone, Sulfamethoxazole-trimethoprim, Amlodipine, Ciprofloxacin, and Tape   History: Past Medical History:  Diagnosis Date   Arthritis    'all over"   Asthma    CAD (coronary artery disease)    Multivessel CAD, not a candidate for CABG, managing medically.   Chronic pain    COPD (chronic obstructive pulmonary disease) (HCC)    O2 per [place] at nights    Depression    Diabetes mellitus without complication (HCC)    borderline- , states she was on med., but MD told her "everything is under control so I threw the bottle away"   Fibromyalgia    GERD (gastroesophageal reflux disease)    Hypertension    Neuromuscular disorder ([place])    parkinson, neuropathy- both feet & hands.   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   Past Surgical History:  Procedure Laterality Date   ABDOMINAL HYSTERECTOMY     ANTERIOR CERVICAL DECOMP/DISCECTOMY FUSION N/A 07/10/2015   Procedure: Cervical five-six, Cervical six-seven anterior cervical decompression with fusion plating and bonegraft;  Surgeon: Jovita Gamma, MD;  Location: [place] [facility];  Service: Neurosurgery;  Laterality: N/A;   APPENDECTOMY     BIOPSY EYE MUSCLE  03/20/2016   biopsy vessel to right eye due to swelling   EYE SURGERY Bilateral    cataracts removed, /w "cyrstal lenses"    LEFT HEART CATH AND CORONARY ANGIOGRAPHY N/A 05/23/2018   Procedure: LEFT HEART CATH AND CORONARY ANGIOGRAPHY;  Surgeon: Troy Sine, MD;  Location: [place] CV LAB;  Service: Cardiovascular;  Laterality: N/A;   SHOULDER ARTHROSCOPY Right    x2   RCR- spurs removed    Family History  Problem Relation Age of Onset   Heart disease Mother    Cancer Father     Heart disease Father    Arthritis Sister    Heart failure Sister    Cancer Brother    Thyroid cancer Son    Atrial fibrillation Son    Arthritis Sister    Social History   Socioeconomic History   Marital status: Married    Spouse name: wayne   Number of children: 2   Years of education: 16   Highest education level: Bachelor's degree (e.g., BA, AB, BS)  Occupational History   Occupation: retired    Comment: Automotive engineer  Tobacco Use   Smoking status: Never   Smokeless tobacco: Never  Vaping Use   Vaping Use: Never used  Substance and Sexual Activity   Alcohol use: No   Drug use: No   Sexual activity: Yes  Other Topics Concern   Not on file  Social  History Narrative   Lives home with her husband   Handicap accessible bathroom   She has had several strokes - left her with impaired balance, memory, speech   Her husband takes care of cooking, cleaning, driving, etc   Social Determinants of Health   Financial Resource Strain: Low Risk    Difficulty of Paying Living Expenses: Not hard at all  Food Insecurity: No Food Insecurity   Worried About Charity fundraiser in the Last Year: Never true   Ran Out of Food in the Last Year: Never true  Transportation Needs: No Transportation Needs   Lack of Transportation (Medical): No   Lack of Transportation (Non-Medical): No  Physical Activity: Inactive   Days of Exercise per Week: 0 days   Minutes of Exercise per Session: 0 min  Stress: No Stress Concern Present   Feeling of Stress : Only a little  Social Connections: Engineer, building services of Communication with Friends and Family: Twice a week   Frequency of Social Gatherings with Friends and Family: Twice a week   Attends Religious Services: More than 4 times per year   Active Member of [employer] or Organizations: Yes   Attends Music therapist: More than 4 times per year   Marital Status: Married    Tobacco Counseling Counseling given:  Not Answered   Clinical Intake:                 Diabetic? Nutrition Risk Assessment:  Has the patient had any N/V/D within the last 2 months?  No  Does the patient have any non-healing wounds?  No  Has the patient had any unintentional weight loss or weight gain?  No   Diabetes:  Is the patient diabetic?  Yes  If diabetic, was a CBG obtained today?  No  Did the patient bring in their glucometer from home?  No  How often do you monitor your CBG's? periodically.   Financial Strains and Diabetes Management:  Are you having any financial strains with the device, your supplies or your medication? No .  Does the patient want to be seen by Chronic Care Management for management of their diabetes?  No  Would the patient like to be referred to a Nutritionist or for Diabetic Management?  No   Diabetic Exams:  Diabetic Eye Exam: Completed 06/2020.   Diabetic Foot Exam: Completed 09/18/2020. Pt has been advised about the importance in completing this exam. Pt is scheduled for diabetic foot exam on next year.           Activities of Daily Living In your present state of health, do you have any difficulty performing the following activities: 04/27/2021  Hearing? N  Vision? N  Difficulty concentrating or making decisions? Y  Walking or climbing stairs? Y  Dressing or bathing? N  Doing errands, shopping? Y  Comment cannot drive since strokes  Preparing Food and eating ? Y  Using the Toilet? N  In the past six months, have you accidently leaked urine? Y  Comment mild  Do you have problems with loss of bowel control? N  Managing your Medications? Y  Managing your Finances? Y  Housekeeping or managing your Housekeeping? Y  Some recent data might be hidden    Patient Care Team: Dettinger, Fransisca Kaufmann, MD as PCP - General (Family Medicine) Herminio Commons, MD (Inactive) as PCP - Cardiology (Cardiology) Gala Romney Cristopher Estimable, MD as Consulting Physician  (Gastroenterology) Lavera Guise, [facility] as  Pharmacist (Family Medicine)  Indicate any recent Medical Services you may have received from other than [facility] providers in the past year (date may be approximate).     Assessment:   This is a routine wellness examination for [place].  Hearing/Vision screen Hearing Screening - Comments:: Denies hearing difficulties Vision Screening - Comments:: Some vision trouble since strokes - behind on annual eye exams with [place] - wears rx glasses  Dietary issues and exercise activities discussed: Current Exercise Habits: The patient does not participate in regular exercise at present, Exercise limited by: neurologic condition(s);orthopedic condition(s);cardiac condition(s);respiratory conditions(s)   Goals Addressed             This Visit's Progress    Exercise 150 min/wk Moderate Activity   Not on track    Prevent falls   Not on track      Depression Screen [facility] 2/9 Scores 04/27/2021 01/15/2021 09/18/2020 06/25/2020 04/24/2020 12/17/2019 11/13/2019  PHQ - 2 Score 0 0 0 0 0 0 0  PHQ- 9 Score - - - - - - -    Fall Risk Fall Risk  04/27/2021 01/15/2021 09/18/2020 09/18/2020 06/25/2020  Falls in the past year? 1 0 1 0 0  Comment - - - - -  Number falls in past yr: 1 - 0 - -  Injury with Fall? 0 - 0 - -  Comment - - - - -  Risk Factor Category  - - - - -  Risk for fall due to : History of fall(s);Impaired balance/gait;Impaired vision;Mental status change;Orthopedic patient;Medication side effect - Impaired balance/gait - -  Risk for fall due to: Comment - - - - -  Follow up Education provided;Falls prevention discussed - Falls evaluation completed - -  Comment - - - - -    FALL RISK PREVENTION PERTAINING TO THE HOME:  Any stairs in or around the home? Yes  If so, are there any without handrails? No  Home free of loose throw rugs in walkways, pet beds, electrical cords, etc? Yes  Adequate lighting in your home to reduce risk of falls? Yes    ASSISTIVE DEVICES UTILIZED TO PREVENT FALLS:  Life alert? Yes  Use of a cane, walker or w/c? Yes  Grab bars in the bathroom? Yes  Shower chair or bench in shower? Yes  Elevated toilet seat or a handicapped toilet? Yes   TIMED UP AND GO:  Was the test performed? No . Telephonic visit  Cognitive Function: Cognitive status assessed by direct observation. Patient has current diagnosis of cognitive impairment. Patient is unable/ refuses to complete screening 6CIT or MMSE.   MMSE - Mini Mental State Exam 08/17/2017  Orientation to time 4  Orientation to Place 5  Registration 3  Attention/ Calculation 5  Recall 3  Language- name 2 objects 2  Language- repeat 1  Language- follow 3 step command 3  Language- read & follow direction 1  Write a sentence 1  Copy design 0  Total score 28     6CIT Screen 04/24/2020 01/25/2019  What Year? 0 points 0 points  What month? 0 points 0 points  What time? 0 points 0 points  Count back from 20 0 points 0 points  Months in reverse 4 points 0 points  Repeat phrase 0 points 2 points  Total Score 4 2    Immunizations Immunization History  Administered Date(s) Administered   Influenza Split 02/14/2018   Influenza, High Dose Seasonal PF 03/23/2017   Influenza,inj,Quad PF,6+ Mos 03/21/2016  Influenza-Unspecified 05/01/2012, 06/12/2013, 04/04/2014, 03/17/2018   Moderna Sars-Covid-2 Vaccination 07/19/2019, 08/24/2019, 06/12/2020   Pneumococcal Conjugate-13 04/04/2014   Pneumococcal Polysaccharide-23 04/25/2006, 03/27/2009   Tdap 05/31/2011    TDAP status: Up to date  Flu Vaccine status: Declined, Education has been provided regarding the importance of this vaccine but patient still declined. Advised may receive this vaccine at local pharmacy or Health Dept. Aware to provide a copy of the vaccination record if obtained from local pharmacy or Health Dept. Verbalized acceptance and understanding.  Pneumococcal vaccine status: Up to  date  Covid-19 vaccine status: Completed vaccines  Qualifies for Shingles Vaccine? Yes   Zostavax completed Yes   Shingrix Completed?: No.    Education has been provided regarding the importance of this vaccine. Patient has been advised to call insurance company to determine out of pocket expense if they have not yet received this vaccine. Advised may also receive vaccine at local pharmacy or Health Dept. Verbalized acceptance and understanding.  Screening Tests Health Maintenance  Topic Date Due   OPHTHALMOLOGY EXAM  06/26/2020   COVID-19 Vaccine (4 - Booster for Moderna series) 08/07/2020   INFLUENZA VACCINE  12/15/2020   URINE MICROALBUMIN  06/25/2021   Zoster Vaccines- Shingrix (1 of 2) 04/12/2022 (Originally [birth year]1993)   TETANUS/TDAP  05/30/2021   HEMOGLOBIN A1C  07/15/2021   FOOT EXAM  09/18/2021   Pneumonia Vaccine 42+ Years old  Completed   DEXA SCAN  Completed   HPV VACCINES  Aged Out   Hepatitis C Screening  Discontinued    Health Maintenance  Health Maintenance Due  Topic Date Due   OPHTHALMOLOGY EXAM  06/26/2020   COVID-19 Vaccine (4 - Booster for Moderna series) 08/07/2020   INFLUENZA VACCINE  12/15/2020   URINE MICROALBUMIN  06/25/2021    Colorectal cancer screening: No longer required.   Mammogram status: No longer required due to age.  Bone Density status: Completed 09/22/2020. Results reflect: Bone density results: OSTEOPENIA. Repeat every 2 years.  Lung Cancer Screening: (Low Dose CT Chest recommended if Age 64-80 years, 30 pack-year currently smoking OR have quit w/in 15years.) does not qualify.  Additional Screening:  Hepatitis C Screening: does not qualify  Vision Screening: Recommended annual ophthalmology exams for early detection of glaucoma and other disorders of the eye. Is the patient up to date with their annual eye exam?  Yes  Who is the provider or what is the name of the office in which the patient attends annual eye exams? [facility] If pt is not established with a provider, would they like to be referred to a provider to establish care? No .   Dental Screening: Recommended annual dental exams for proper oral hygiene  Community Resource Referral / Chronic Care Management: CRR required this visit?  No   CCM required this visit?  No      Plan:     I have personally reviewed and noted the following in the patient's chart:   Medical and social history Use of alcohol, tobacco or illicit drugs  Current medications and supplements including opioid prescriptions.  Functional ability and status Nutritional status Physical activity Advanced directives List of other physicians Hospitalizations, surgeries, and ER visits in previous 12 months Vitals Screenings to include cognitive, depression, and falls Referrals and appointments  In addition, I have reviewed and discussed with patient certain preventive protocols, quality metrics, and best practice recommendations. A written personalized care plan for preventive services as well as general preventive health recommendations were provided to  patient.     Sandrea Hammond, LPN   [id number]   Nurse Notes: None

## 2021-04-27 NOTE — Patient Instructions (Signed)
Susan Patel , Thank you for taking time to come for your Medicare Wellness Visit. I appreciate your ongoing commitment to your health goals. Please review the following plan we discussed and let me know if I can assist you in the future.   Screening recommendations/referrals: Colonoscopy: No longer required Mammogram: No longer required, but still advised every 1-2 years Bone Density: Done 09/22/2020 - Repeat every 2 years  Recommended yearly ophthalmology/optometry visit for glaucoma screening and checkup Recommended yearly dental visit for hygiene and checkup  Vaccinations: Influenza vaccine: Declined Pneumococcal vaccine: Done 03/27/2009 & 04/04/2014 Tdap vaccine: Done 05/31/2011 - Repeat in 10 years Shingles vaccine: Due   Covid-19: Done 07/19/2019, 08/24/2019, & 06/12/2020  Advanced directives: Please bring a copy of your health care power of attorney and living will to the office to be added to your chart at your convenience.   Conditions/risks identified: Aim for 30 minutes of exercise or brisk walking each day, drink 6-8 glasses of water and eat lots of fruits and vegetables.   Next appointment: Follow up in one year for your annual wellness visit    Preventive Care 65 Years and Older, Female Preventive care refers to lifestyle choices and visits with your health care provider that can promote health and wellness. What does preventive care include? A yearly physical exam. This is also called an annual well check. Dental exams once or twice a year. Routine eye exams. Ask your health care provider how often you should have your eyes checked. Personal lifestyle choices, including: Daily care of your teeth and gums. Regular physical activity. Eating a healthy diet. Avoiding tobacco and drug use. Limiting alcohol use. Practicing safe sex. Taking low-dose aspirin every day. Taking vitamin and mineral supplements as recommended by your health care provider. What happens during an  annual well check? The services and screenings done by your health care provider during your annual well check will depend on your age, overall health, lifestyle risk factors, and family history of disease. Counseling  Your health care provider may ask you questions about your: Alcohol use. Tobacco use. Drug use. Emotional well-being. Home and relationship well-being. Sexual activity. Eating habits. History of falls. Memory and ability to understand (cognition). Work and work Statistician. Reproductive health. Screening  You may have the following tests or measurements: Height, weight, and BMI. Blood pressure. Lipid and cholesterol levels. These may be checked every 5 years, or more frequently if you are over 53 years old. Skin check. Lung cancer screening. You may have this screening every year starting at age 5 if you have a 30-pack-year history of smoking and currently smoke or have quit within the past 15 years. Fecal occult blood test (FOBT) of the stool. You may have this test every year starting at age 63. Flexible sigmoidoscopy or colonoscopy. You may have a sigmoidoscopy every 5 years or a colonoscopy every 10 years starting at age 62. Hepatitis C blood test. Hepatitis B blood test. Sexually transmitted disease (STD) testing. Diabetes screening. This is done by checking your blood sugar (glucose) after you have not eaten for a while (fasting). You may have this done every 1-3 years. Bone density scan. This is done to screen for osteoporosis. You may have this done starting at age 79. Mammogram. This may be done every 1-2 years. Talk to your health care provider about how often you should have regular mammograms. Talk with your health care provider about your test results, treatment options, and if necessary, the need for more tests.  Vaccines  Your health care provider may recommend certain vaccines, such as: Influenza vaccine. This is recommended every year. Tetanus,  diphtheria, and acellular pertussis (Tdap, Td) vaccine. You may need a Td booster every 10 years. Zoster vaccine. You may need this after age 57. Pneumococcal 13-valent conjugate (PCV13) vaccine. One dose is recommended after age 36. Pneumococcal polysaccharide (PPSV23) vaccine. One dose is recommended after age 59. Talk to your health care provider about which screenings and vaccines you need and how often you need them. This information is not intended to replace advice given to you by your health care provider. Make sure you discuss any questions you have with your health care provider. Document Released: 05/30/2015 Document Revised: 01/21/2016 Document Reviewed: 03/04/2015 Elsevier Interactive Patient Education  2017 [place] Prevention in the Home Falls can cause injuries. They can happen to people of all ages. There are many things you can do to make your home safe and to help prevent falls. What can I do on the outside of my home? Regularly fix the edges of walkways and driveways and fix any cracks. Remove anything that might make you trip as you walk through a door, such as a raised step or threshold. Trim any bushes or trees on the path to your home. Use bright outdoor lighting. Clear any walking paths of anything that might make someone trip, such as rocks or tools. Regularly check to see if handrails are loose or broken. Make sure that both sides of any steps have handrails. Any raised decks and porches should have guardrails on the edges. Have any leaves, snow, or ice cleared regularly. Use sand or salt on walking paths during winter. Clean up any spills in your garage right away. This includes oil or grease spills. What can I do in the bathroom? Use night lights. Install grab bars by the toilet and in the tub and shower. Do not use towel bars as grab bars. Use non-skid mats or decals in the tub or shower. If you need to sit down in the shower, use a plastic,  non-slip stool. Keep the floor dry. Clean up any water that spills on the floor as soon as it happens. Remove soap buildup in the tub or shower regularly. Attach bath mats securely with double-sided non-slip rug tape. Do not have throw rugs and other things on the floor that can make you trip. What can I do in the bedroom? Use night lights. Make sure that you have a light by your bed that is easy to reach. Do not use any sheets or blankets that are too big for your bed. They should not hang down onto the floor. Have a firm chair that has side arms. You can use this for support while you get dressed. Do not have throw rugs and other things on the floor that can make you trip. What can I do in the kitchen? Clean up any spills right away. Avoid walking on wet floors. Keep items that you use a lot in easy-to-reach places. If you need to reach something above you, use a strong step stool that has a grab bar. Keep electrical cords out of the way. Do not use floor polish or wax that makes floors slippery. If you must use wax, use non-skid floor wax. Do not have throw rugs and other things on the floor that can make you trip. What can I do with my stairs? Do not leave any items on the stairs. Make sure that  there are handrails on both sides of the stairs and use them. Fix handrails that are broken or loose. Make sure that handrails are as long as the stairways. Check any carpeting to make sure that it is firmly attached to the stairs. Fix any carpet that is loose or worn. Avoid having throw rugs at the top or bottom of the stairs. If you do have throw rugs, attach them to the floor with carpet tape. Make sure that you have a light switch at the top of the stairs and the bottom of the stairs. If you do not have them, ask someone to add them for you. What else can I do to help prevent falls? Wear shoes that: Do not have high heels. Have rubber bottoms. Are comfortable and fit you well. Are closed  at the toe. Do not wear sandals. If you use a stepladder: Make sure that it is fully opened. Do not climb a closed stepladder. Make sure that both sides of the stepladder are locked into place. Ask someone to hold it for you, if possible. Clearly mark and make sure that you can see: Any grab bars or handrails. First and last steps. Where the edge of each step is. Use tools that help you move around (mobility aids) if they are needed. These include: Canes. Walkers. Scooters. Crutches. Turn on the lights when you go into a dark area. Replace any light bulbs as soon as they burn out. Set up your furniture so you have a clear path. Avoid moving your furniture around. If any of your floors are uneven, fix them. If there are any pets around you, be aware of where they are. Review your medicines with your doctor. Some medicines can make you feel dizzy. This can increase your chance of falling. Ask your doctor what other things that you can do to help prevent falls. This information is not intended to replace advice given to you by your health care provider. Make sure you discuss any questions you have with your health care provider. Document Released: 02/27/2009 Document Revised: 10/09/2015 Document Reviewed: 06/07/2014 Elsevier Interactive Patient Education  2017 [employer].

## 2021-04-29 ENCOUNTER — Other Ambulatory Visit: Payer: Self-pay | Admitting: Internal Medicine

## 2021-04-29 DIAGNOSIS — R058 Other specified cough: Secondary | ICD-10-CM

## 2021-05-06 ENCOUNTER — Ambulatory Visit (INDEPENDENT_AMBULATORY_CARE_PROVIDER_SITE_OTHER): Payer: Medicare HMO | Admitting: Pharmacist

## 2021-05-06 DIAGNOSIS — I10 Essential (primary) hypertension: Secondary | ICD-10-CM

## 2021-05-06 DIAGNOSIS — E1142 Type 2 diabetes mellitus with diabetic polyneuropathy: Secondary | ICD-10-CM

## 2021-05-06 NOTE — Progress Notes (Signed)
Chronic Care Management Pharmacy Note  05/06/2021 Name:  Susan Patel MRN:  [id number] DOB:  Jun 30, 1940  Summary: t2dm  Recommendations/Changes made from today's visit:  Diabetes: New goal. Controlled-A1C 6.3%; current treatment: TRESIBA 10 UNITS, TRULICITY 0.01VC SQ WEEKLY;  Denies personal and family history of Medullary thyroid cancer (MTC) CONTINUE CURRENT THERAPY Will transition from tresiba to basaglar 10 units since able to get free from [facility] patient assistance  Current glucose readings: fasting glucose: <130, post prandial glucose: n/a Denies hypoglycemic/hyperglycemic symptoms Discussed meal planning options and Plate method for healthy eating Avoid sugary drinks and desserts Incorporate balanced protein, non starchy veggies, 1 serving of carbohydrate with each meal Increase water intake Increase physical activity as able Current exercise: n/a Educated on glp1, patient assistance, medications Collaborated with PCP on application Assessed patient finances. Patient approved for [facility] patient assistance until 04/2022--added basaglar  (will finish tresiba then transition since free)  Hypertension:  New goal. uncontrolled; current treatment:carvedilol, isosorbide Current home readings: [id number] systolics Denies hypotensive/hypertensive symptoms Educated on BP goal <130/80--taking medications as prescribed Recommended continue keeping BP log and will address at follow up   Patient Goals/Self-Care Activities patient will:  - take medications as prescribed as evidenced by patient report and record review check blood pressure daily, document, and provide at future appointments collaborate with provider on medication access solutions  Subjective: Susan Patel is an 80 y.o. year old female who is a primary patient of Dettinger, Fransisca Kaufmann, MD.  The CCM team was consulted for assistance with disease management and care coordination needs.    Engaged with  patient by telephone for follow up visit in response to provider referral for pharmacy case management and/or care coordination services.   Consent to Services:  The patient was given information about Chronic Care Management services, agreed to services, and gave verbal consent prior to initiation of services.  Please see initial visit note for detailed documentation.   Patient Care Team: Dettinger, Fransisca Kaufmann, MD as PCP - General (Family Medicine) Herminio Commons, MD (Inactive) as PCP - Cardiology (Cardiology) Daneil Dolin, MD as Consulting Physician (Gastroenterology) Lavera Guise, [facility] as Pharmacist (Family Medicine)  Objective:  Lab Results  Component Value Date   CREATININE 1.07 (H) 01/15/2021   CREATININE 0.97 06/25/2020   CREATININE 0.86 11/22/2019    Lab Results  Component Value Date   HGBA1C 6.3 01/15/2021   Last diabetic Eye exam:  Lab Results  Component Value Date/Time   HMDIABEYEEXA No Retinopathy 05/05/2015 12:00 AM    Last diabetic Foot exam: No results found for: HMDIABFOOTEX      Component Value Date/Time   CHOL 204 (H) 06/25/2020 1332   TRIG 137 06/25/2020 1332   HDL 48 06/25/2020 1332   CHOLHDL 4.3 06/25/2020 1332   CHOLHDL 6.0 (H) 05/19/2018 1151   VLDL 21 10/10/2016 0141   LDLCALC 131 (H) 06/25/2020 1332   LDLCALC 180 (H) 05/19/2018 1151    Hepatic Function Latest Ref Rng & Units 01/15/2021 06/25/2020 11/22/2019  Total Protein 6.0 - 8.5 g/dL 6.8 6.6 6.6  Albumin 3.7 - 4.7 g/dL 4.1 4.2 3.6  AST 0 - 40 IU/L '15 17 16  ' ALT 0 - 32 IU/L '10 8 14  ' Alk Phosphatase 44 - 121 IU/L 143(H) 147(H) 93  Total Bilirubin 0.0 - 1.2 mg/dL 0.4 0.4 0.6    Lab Results  Component Value Date/Time   TSH 3.283 08/20/2018 12:23 AM   TSH 3.699 03/22/2016 06:31  AM    CBC Latest Ref Rng & Units 01/15/2021 06/25/2020 05/13/2020  WBC 3.4 - 10.8 x10E3/uL 6.4 6.2 7.3  Hemoglobin 11.1 - 15.9 g/dL 13.7 13.6 13.3  Hematocrit 34.0 - 46.6 % 40.4 41.3 41.6  Platelets 150 - 450  x10E3/uL 246 278 267    Lab Results  Component Value Date/Time   VD25OH 35.8 05/03/2017 11:15 AM    Clinical ASCVD: Yes  The ASCVD Risk score (Arnett DK, et al., 2019) failed to calculate for the following reasons:   The patient has a prior MI or stroke diagnosis    Other: (CHADS2VASc if Afib, PHQ9 if depression, MMRC or CAT for COPD, ACT, DEXA)  Social History   Tobacco Use  Smoking Status Never  Smokeless Tobacco Never   BP Readings from Last 3 Encounters:  04/14/21 (!) 167/81  01/15/21 (!) 170/83  09/18/20 (!) 149/72   Pulse Readings from Last 3 Encounters:  04/14/21 94  01/15/21 68  09/18/20 79   Wt Readings from Last 3 Encounters:  04/27/21 199 lb (90.3 kg)  01/15/21 199 lb (90.3 kg)  09/18/20 202 lb (91.6 kg)    Assessment: Review of patient past medical history, allergies, medications, health status, including review of consultants reports, laboratory and other test data, was performed as part of comprehensive evaluation and provision of chronic care management services.   SDOH:  (Social Determinants of Health) assessments and interventions performed:    CCM Care Plan  Allergies  Allergen Reactions   Azithromycin Other (See Comments)    Renal failure   Prednisone Other (See Comments)    Irritability and insomnia   Sulfamethoxazole-Trimethoprim Other (See Comments)    Renal failure   Amlodipine Swelling    Caused swelling in legs   Ciprofloxacin Itching and Rash   Tape Rash    Medications Reviewed Today     Reviewed by Lavera Guise, [facility] (Pharmacist) on 05/06/21 at 1417  Med List Status: <None>   Medication Order Taking? Sig Documenting Provider Last Dose Status Informant  acetaminophen (TYLENOL) 325 MG tablet [id number] No Take 2 tablets (650 mg total) by mouth every 6 (six) hours as needed. Varney Biles, MD Taking Active Spouse/Significant Other  albuterol (VENTOLIN HFA) 108 (90 Base) MCG/ACT inhaler [id number] No INHALE 1-2 PUFFS BY MOUTH  EVERY 6 HOURS AS NEEDED FOR WHEEZE OR SHORTNESS OF BREATH Dettinger, Fransisca Kaufmann, MD Taking Active   aspirin 81 MG EC tablet [id number] No TAKE 1 TABLET (81 MG TOTAL) BY MOUTH DAILY FOR 90 DAYS. Dettinger, Fransisca Kaufmann, MD Taking Active   carvedilol (COREG) 12.5 MG tablet [id number] No Take 1 tablet (12.5 mg total) by mouth 2 (two) times daily. Dettinger, Fransisca Kaufmann, MD Taking Active   Dulaglutide (TRULICITY) 9.47 ML/4.6TK SOPN [id number] No Inject 0.75 mg into the skin once a week. Dettinger, Fransisca Kaufmann, MD Taking Active   famotidine (PEPCID) 20 MG tablet [id number]  TAKE 1 TABLET BY MOUTH EVERY DAY AFTER SUPPER Tanda Rockers, MD  Active   fluticasone [facility]) 50 MCG/ACT nasal spray [id number] No Place 1 spray into both nostrils 2 (two) times daily as needed for allergies or rhinitis. Dettinger, Fransisca Kaufmann, MD Taking Active   glucose blood test strip [id number] No Use as instructed Eustaquio Maize, MD Taking Active Spouse/Significant Other  insulin degludec (TRESIBA FLEXTOUCH) 100 UNIT/ML FlexTouch Pen [id number] No Inject 10 Units into the skin daily. Dettinger, Fransisca Kaufmann, MD Taking Active   Insulin Pen Needle (B-D UF III MINI PEN NEEDLES)  31G X 5 MM MISC [id number] No 1 each by Does not apply route daily. Dettinger, Fransisca Kaufmann, MD Taking Active Spouse/Significant Other  isosorbide mononitrate (IMDUR) 30 MG 24 hr tablet [id number] No TAKE 1 TABLET BY MOUTH EVERY DAY Verta Ellen., NP Taking Active   linaclotide [facility]) 290 MCG CAPS capsule [id number] No Take 1 capsule (290 mcg total) by mouth daily before breakfast. Dettinger, Fransisca Kaufmann, MD Taking Active   methocarbamol (ROBAXIN) 750 MG tablet [id number] No Take 1 tablet (750 mg total) by mouth at bedtime as needed. Dettinger, Fransisca Kaufmann, MD Taking Active   montelukast (SINGULAIR) 10 MG tablet [id number] No Take 1 tablet (10 mg total) by mouth daily. Dettinger, Fransisca Kaufmann, MD Taking Active   nitroGLYCERIN (NITROSTAT) 0.4 MG SL tablet [id number] No Place 1 tablet (0.4 mg  total) under the tongue every 5 (five) minutes as needed. Verta Ellen., NP Taking Active   [facility] LANCETS FINE MISC [id number] No TEST TWICE DAILY Eustaquio Maize, MD Taking Active Spouse/Significant Other  OXYGEN [id number] No Inhale 3-4 L into the lungs at bedtime. [provider] Taking Active Spouse/Significant Other  pantoprazole (PROTONIX) 40 MG tablet [id number]  TAKE 1 TABLET (40 MG TOTAL) BY MOUTH DAILY. TAKE 30-60 MIN BEFORE FIRST MEAL OF THE DAY Tanda Rockers, MD  Active   rosuvastatin (CRESTOR) 20 MG tablet [id number] No TAKE 1 TABLET BY MOUTH EVERY DAY Dettinger, Fransisca Kaufmann, MD Taking Active   traZODone (DESYREL) 50 MG tablet [id number] No Take 1 tablet (50 mg total) by mouth at bedtime as needed for sleep. Dettinger, Fransisca Kaufmann, MD Taking Active   vitamin B-12 (CYANOCOBALAMIN) 1000 MCG tablet [id number] No Take 1,000 mcg by mouth daily. 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   Contusion of left elbow 03/15/2017   Dysmetria    Supplemental oxygen dependent    Hypoalbuminemia due to protein-calorie malnutrition ([place])    Oropharyngeal dysphagia    Cognitive deficit due to recent stroke 04/23/2016   Frequent falls 04/21/2016   Type 2 diabetes mellitus with peripheral neuropathy (HCC)    Fibromyalgia    Chronic pain syndrome    Chronic obstructive pulmonary disease (HCC)    Gait disturbance,  post-stroke    Dysarthria, post-stroke    Dysphagia, post-stroke    Mixed hyperlipidemia    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   Allergic rhinitis 12/17/2015   Primary osteoarthritis of both knees 11/19/2015   Obesity (BMI 30-39.9) 09/04/2015   Essential hypertension 09/04/2015   Constipation 09/04/2015   Raynaud's phenomenon 08/26/2015   Gastro-esophageal reflux disease with esophagitis 08/26/2015   Spinal stenosis 08/26/2015   Vitamin D deficiency 08/26/2015   DDD (degenerative disc disease), cervical 07/10/2015   Cervical radicular pain 04/30/2013   Knee osteoarthritis 01/05/2012    Immunization History  Administered Date(s) Administered   Influenza Split 02/14/2018   Influenza,  High Dose Seasonal PF 03/23/2017   Influenza,inj,Quad PF,6+ Mos 03/21/2016   Influenza-Unspecified 05/01/2012, 06/12/2013, 04/04/2014, 03/17/2018   Moderna Sars-Covid-2 Vaccination 07/19/2019, 08/24/2019, 06/12/2020   Pneumococcal Conjugate-13 04/04/2014   Pneumococcal Polysaccharide-23 04/25/2006, 03/27/2009   Tdap 05/31/2011    Conditions to be addressed/monitored: HTN and DMII  Care Plan : PHARMD MEDICATION MANAGEMENT  Updates made by Lavera Guise, [place] since 05/06/2021 12:00 AM     Problem: DISEASE PROGRESSION PREVENTION      Long-Range Goal: T2DM, HTN   Recent Progress: Not on track  Priority: High  Note:   Current Barriers:  Unable to independently afford treatment regimen Unable to achieve control of T2DM   Pharmacist Clinical Goal(s):  patient will verbalize ability to afford treatment regimen achieve control of T2DM as evidenced by GOAL A1C, IMPROVED GLYCEMIC CONTROL maintain control of T2DM as evidenced by GOAL A1C, IMPROVED GLYCEMIC CONTROL  through collaboration with PharmD and provider.   Interventions: 1:1 collaboration with Dettinger, Fransisca Kaufmann, MD regarding development and update of comprehensive plan of care as evidenced by provider attestation and  co-signature Inter-disciplinary care team collaboration (see longitudinal plan of care) Comprehensive medication review performed; medication list updated in electronic medical record  Diabetes: New goal. Controlled-A1C 6.3%; current treatment: TRESIBA 10 UNITS, TRULICITY 8.52DP SQ WEEKLY;  Denies personal and family history of Medullary thyroid cancer (MTC) CONTINUE CURRENT THERAPY Will transition from tresiba to basaglar 10 units since able to get free from [facility] [employer] patient assistance  Current glucose readings: fasting glucose: <130, post prandial glucose: n/a Denies hypoglycemic/hyperglycemic symptoms Discussed meal planning options and Plate method for healthy eating Avoid sugary drinks and desserts Incorporate balanced protein, non starchy veggies, 1 serving of carbohydrate with each meal Increase water intake Increase physical activity as able Current exercise: n/a Educated on glp1, patient assistance, medications Collaborated with PCP on application Assessed patient finances. Patient approved for [facility] [employer] patient assistance until 04/2022--added basaglar  (will finish tresiba then transition since free)  Hypertension:  New goal. uncontrolled; current treatment:carvedilol, isosorbide Current home readings: [id number] systolics Denies hypotensive/hypertensive symptoms Educated on BP goal <130/80--taking medications as prescribed Recommended continue keeping BP log and will address at follow up   Patient Goals/Self-Care Activities patient will:  - take medications as prescribed as evidenced by patient report and record review check blood pressure daily, document, and provide at future appointments collaborate with provider on medication access solutions      Medication Assistance:  trulicity (now basaglar) obtained through [place] [facility] medication assistance program.  Enrollment ends 05/16/22  Patient's preferred pharmacy is:  [id number] [place] [place] [address] [address]Phone: [phone]: [address]Phone: [phone]: [phone]  Follow Up:  Patient agrees to Care Plan and Follow-up.  Plan: Telephone follow up appointment with care management team member scheduled for:  03/2022    JRegina Eck PharmD, BCPS Clinical Pharmacist, [place] Phone [phone]

## 2021-05-06 NOTE — Patient Instructions (Signed)
Visit Information  Thank you for taking time to visit with me today. Please don't hesitate to contact me if I can be of assistance to you before our next scheduled telephone appointment.  Following are the goals we discussed today:  Current Barriers:  Unable to independently afford treatment regimen Unable to achieve control of T2DM   Pharmacist Clinical Goal(s):  patient will verbalize ability to afford treatment regimen achieve control of T2DM as evidenced by GOAL A1C, IMPROVED GLYCEMIC CONTROL maintain control of T2DM as evidenced by GOAL A1C, IMPROVED GLYCEMIC CONTROL  through collaboration with PharmD and provider.   Interventions: 1:1 collaboration with Dettinger, Fransisca Kaufmann, MD regarding development and update of comprehensive plan of care as evidenced by provider attestation and co-signature Inter-disciplinary care team collaboration (see longitudinal plan of care) Comprehensive medication review performed; medication list updated in electronic medical record  Diabetes: New goal. Controlled-A1C 6.3%; current treatment: TRESIBA 10 UNITS, TRULICITY 0.75MG  SQ WEEKLY;  Denies personal and family history of Medullary thyroid cancer (MTC) CONTINUE CURRENT THERAPY Will transition from tresiba to basaglar 10 units since able to get free from [employer] patient assistance  Current glucose readings: fasting glucose: <130, post prandial glucose: n/a Denies hypoglycemic/hyperglycemic symptoms Discussed meal planning options and Plate method for healthy eating Avoid sugary drinks and desserts Incorporate balanced protein, non starchy veggies, 1 serving of carbohydrate with each meal Increase water intake Increase physical activity as able Current exercise: n/a Educated on glp1, patient assistance, medications Collaborated with PCP on application Assessed patient finances. Patient approved for [employer] patient assistance until 04/2022--added basaglar  (will finish tresiba then  transition since free)  Hypertension:  New goal. uncontrolled; current treatment:carvedilol, isosorbide Current home readings: [phone] systolics Denies hypotensive/hypertensive symptoms Educated on BP goal <130/80--taking medications as prescribed Recommended continue keeping BP log and will address at follow up   Patient Goals/Self-Care Activities patient will:  - take medications as prescribed as evidenced by patient report and record review check blood pressure daily, document, and provide at future appointments collaborate with provider on medication access solutions   Please call the care guide team at [phone] if you need to cancel or reschedule your appointment.   The patient verbalized understanding of instructions, educational materials, and care plan provided today and declined offer to receive copy of patient instructions, educational materials, and care plan.   Signature Regina Eck, PharmD, BCPS Clinical Pharmacist, [place]  II Phone [phone]

## 2021-05-16 DIAGNOSIS — I10 Essential (primary) hypertension: Secondary | ICD-10-CM

## 2021-05-16 DIAGNOSIS — E1142 Type 2 diabetes mellitus with diabetic polyneuropathy: Secondary | ICD-10-CM

## 2021-05-28 ENCOUNTER — Telehealth: Payer: Self-pay | Admitting: Pharmacist

## 2021-05-28 ENCOUNTER — Ambulatory Visit (INDEPENDENT_AMBULATORY_CARE_PROVIDER_SITE_OTHER): Payer: Medicare Other | Admitting: Pharmacist

## 2021-05-28 DIAGNOSIS — E1142 Type 2 diabetes mellitus with diabetic polyneuropathy: Secondary | ICD-10-CM

## 2021-05-28 DIAGNOSIS — E782 Mixed hyperlipidemia: Secondary | ICD-10-CM

## 2021-05-28 NOTE — Telephone Encounter (Signed)
I spoke to pt's husband and he will come pick up the packet this morning to complete and then bring it back with all the requested documents for Almyra Free to fax.

## 2021-05-28 NOTE — Telephone Encounter (Signed)
LINZESS APPLICATION WAS DROPPED OFF IN MY BOX  MD & PharmD completed sections  Will leave up front for patient to pick up and complete.  The question "does patient have commercial/private insurance needs to be answered.  We need ALL of patient's most recent insurance cards. And they need to include financial documentation for everyone in the household (preferably a copy of federal tax return) per program rules  Patient can return to [place] if he wants [place] to fax it and follow.  If not, he can fax it himself

## 2021-05-28 NOTE — Patient Instructions (Addendum)
Visit Information  Thank you for taking time to visit with me today. Please don't hesitate to contact me if I can be of assistance to you before our next scheduled telephone appointment.  Following are the goals we discussed today:   Diabetes: Goal on Track (progressing): YES. Controlled-A1C 6.3%, GFR 53;   Current treatment: BASAGLAR 10 UNITS, TRULICITY 0.75MG  SQ WEEKLY;  Denies personal and family history of Medullary thyroid cancer (MTC) CONTINUE CURRENT THERAPY Transitioned from [place] to [place] 10 units since able to get free from [employer] patient assistance  Current glucose readings: fasting glucose: <130, post prandial glucose: n/a Denies hypoglycemic/hyperglycemic symptoms Discussed meal planning options and Plate method for healthy eating Avoid sugary drinks and desserts Incorporate balanced protein, non starchy veggies, 1 serving of carbohydrate with each meal Increase water intake Increase physical activity as able Current exercise: n/a Educated on glp1, patient assistance, medications Collaborated with PCP on application Assessed patient finances. Patient approved for [employer] patient assistance until 04/2022--added basaglar    Hypertension:  Goal on Track (progressing): YES. uncontrolled; current treatment:carvedilol, isosorbide Current home readings: 185-631S systolics Denies hypotensive/hypertensive symptoms Educated on BP goal <130/80--taking medications as prescribed Recommended continue keeping BP log and will address at follow up  *Application for [place] via [place] patient assistance completed for patient and placed up front*  Patient Goals/Self-Care Activities patient will:  - take medications as prescribed as evidenced by patient report and record review check blood pressure daily, document, and provide at future appointments collaborate with provider on medication access solutions   Plan: F/U 03/2022  Our next appointment is by telephone on  November 2023   Please call the care guide team at [phone] if you need to cancel or reschedule your appointment.   If you are experiencing a Mental Health or [place] or need someone to talk to, please call the Suicide and Crisis Lifeline: 988 call the [place] National Suicide Prevention Lifeline: 410-008-1682 or TTY: 8027958394 TTY 360-453-7279) to talk to a trained counselor   The patient verbalized understanding of instructions, educational materials, and care plan provided today and agreed to receive a mailed copy of patient instructions, educational materials, and care plan.   Signature Susan Patel, PharmD, BCPS Clinical Pharmacist, [place]  II Phone [phone]

## 2021-05-28 NOTE — Progress Notes (Signed)
Chronic Care Management Pharmacy Note  05/28/2021 Name:  Susan Patel MRN:  [id number] DOB:  Jun 26, 1940  Summary: T2DM  Recommendations/Changes made from today's visit:  Diabetes: Goal on Track (progressing): YES. Controlled-A1C 6.3%, GFR 53;   Current treatment: BASAGLAR 10 UNITS, TRULICITY 2.42AS SQ WEEKLY;  Denies personal and family history of Medullary thyroid cancer (MTC) CONTINUE CURRENT THERAPY Transitioned from [place] to [place] 10 units since able to get free from [employer] patient assistance  Current glucose readings: fasting glucose: <130, post prandial glucose: n/a Denies hypoglycemic/hyperglycemic symptoms Discussed meal planning options and Plate method for healthy eating Avoid sugary drinks and desserts Incorporate balanced protein, non starchy veggies, 1 serving of carbohydrate with each meal Increase water intake Increase physical activity as able Current exercise: n/a Educated on glp1, patient assistance, medications Collaborated with PCP on application Assessed patient finances. Patient approved for [employer] patient assistance until 04/2022--added basaglar    Hypertension:  Goal on Track (progressing): YES. uncontrolled; current treatment:carvedilol, isosorbide Current home readings: 341-962I systolics Denies hypotensive/hypertensive symptoms Educated on BP goal <130/80--taking medications as prescribed Recommended continue keeping BP log and will address at follow up  *Application for [employer] via [place] patient assistance completed for patient and placed up front*  Patient Goals/Self-Care Activities patient will:  - take medications as prescribed as evidenced by patient report and record review check blood pressure daily, document, and provide at future appointments collaborate with provider on medication access solutions   Plan: F/U 03/2022  Subjective: Susan Patel is an 81 y.o. year old female who is a primary patient of Dettinger,  Fransisca Kaufmann, MD.  The CCM team was consulted for assistance with disease management and care coordination needs.    Engaged with patient by telephone for follow up visit in response to provider referral for pharmacy case management and/or care coordination services.   Consent to Services:  The patient was given information about Chronic Care Management services, agreed to services, and gave verbal consent prior to initiation of services.  Please see initial visit note for detailed documentation.   Patient Care Team: Dettinger, Fransisca Kaufmann, MD as PCP - General (Family Medicine) Herminio Commons, MD (Inactive) as PCP - Cardiology (Cardiology) Daneil Dolin, MD as Consulting Physician (Gastroenterology) Lavera Guise, [facility] as Pharmacist (Family Medicine)  Objective:  Lab Results  Component Value Date   CREATININE 1.07 (H) 01/15/2021   CREATININE 0.97 06/25/2020   CREATININE 0.86 11/22/2019    Lab Results  Component Value Date   HGBA1C 6.3 01/15/2021   Last diabetic Eye exam:  Lab Results  Component Value Date/Time   HMDIABEYEEXA No Retinopathy 05/05/2015 12:00 AM    Last diabetic Foot exam: No results found for: HMDIABFOOTEX      Component Value Date/Time   CHOL 204 (H) 06/25/2020 1332   TRIG 137 06/25/2020 1332   HDL 48 06/25/2020 1332   CHOLHDL 4.3 06/25/2020 1332   CHOLHDL 6.0 (H) 05/19/2018 1151   VLDL 21 10/10/2016 0141   LDLCALC 131 (H) 06/25/2020 1332   LDLCALC 180 (H) 05/19/2018 1151    Hepatic Function Latest Ref Rng & Units 01/15/2021 06/25/2020 11/22/2019  Total Protein 6.0 - 8.5 g/dL 6.8 6.6 6.6  Albumin 3.7 - 4.7 g/dL 4.1 4.2 3.6  AST 0 - 40 IU/L '15 17 16  ' ALT 0 - 32 IU/L '10 8 14  ' Alk Phosphatase 44 - 121 IU/L 143(H) 147(H) 93  Total Bilirubin 0.0 - 1.2 mg/dL 0.4 0.4 0.6  Lab Results  Component Value Date/Time   TSH 3.283 08/20/2018 12:23 AM   TSH 3.699 03/22/2016 06:31 AM    CBC Latest Ref Rng & Units 01/15/2021 06/25/2020 05/13/2020  WBC 3.4 - 10.8  x10E3/uL 6.4 6.2 7.3  Hemoglobin 11.1 - 15.9 g/dL 13.7 13.6 13.3  Hematocrit 34.0 - 46.6 % 40.4 41.3 41.6  Platelets 150 - 450 x10E3/uL 246 278 267    Lab Results  Component Value Date/Time   VD25OH 35.8 05/03/2017 11:15 AM    Clinical ASCVD: Yes  The ASCVD Risk score (Arnett DK, et al., 2019) failed to calculate for the following reasons:   The patient has a prior MI or stroke diagnosis    Other: (CHADS2VASc if Afib, PHQ9 if depression, MMRC or CAT for COPD, ACT, DEXA)  Social History   Tobacco Use  Smoking Status Never  Smokeless Tobacco Never   BP Readings from Last 3 Encounters:  04/14/21 (!) 167/81  01/15/21 (!) 170/83  09/18/20 (!) 149/72   Pulse Readings from Last 3 Encounters:  04/14/21 94  01/15/21 68  09/18/20 79   Wt Readings from Last 3 Encounters:  04/27/21 199 lb (90.3 kg)  01/15/21 199 lb (90.3 kg)  09/18/20 202 lb (91.6 kg)    Assessment: Review of patient past medical history, allergies, medications, health status, including review of consultants reports, laboratory and other test data, was performed as part of comprehensive evaluation and provision of chronic care management services.   SDOH:  (Social Determinants of Health) assessments and interventions performed:    CCM Care Plan  Allergies  Allergen Reactions   Azithromycin Other (See Comments)    Renal failure   Prednisone Other (See Comments)    Irritability and insomnia   Sulfamethoxazole-Trimethoprim Other (See Comments)    Renal failure   Amlodipine Swelling    Caused swelling in legs   Ciprofloxacin Itching and Rash   Tape Rash    Medications Reviewed Today     Reviewed by Lavera Guise, [facility] (Pharmacist) on 05/28/21 at [place] [facility] Status: <None>   Medication Order Taking? Sig Documenting Provider Last Dose Status Informant  acetaminophen (TYLENOL) 325 MG tablet [id number] No Take 2 tablets (650 mg total) by mouth every 6 (six) hours as needed. Varney Biles, MD Taking  Active Spouse/Significant Other  albuterol (VENTOLIN HFA) 108 (90 Base) MCG/ACT inhaler [id number] No INHALE 1-2 PUFFS BY MOUTH EVERY 6 HOURS AS NEEDED FOR WHEEZE OR SHORTNESS OF BREATH Dettinger, Fransisca Kaufmann, MD Taking Active   aspirin 81 MG EC tablet [id number] No TAKE 1 TABLET (81 MG TOTAL) BY MOUTH DAILY FOR 90 DAYS. Dettinger, Fransisca Kaufmann, MD Taking Active   carvedilol (COREG) 12.5 MG tablet [id number] No Take 1 tablet (12.5 mg total) by mouth 2 (two) times daily. Dettinger, Fransisca Kaufmann, MD Taking Active   Dulaglutide (TRULICITY) 2.40 XB/3.5HG SOPN [id number] No Inject 0.75 mg into the skin once a week. Dettinger, Fransisca Kaufmann, MD Taking Active            Med Note Leisa Lenz May 06, 2021  2:24 PM) Via l[employer] cares patient assistance program   famotidine (PEPCID) 20 MG tablet [id number]  TAKE 1 TABLET BY MOUTH EVERY DAY AFTER SUPPER Tanda Rockers, MD  Active   fluticasone (FLONASE) 50 MCG/ACT nasal spray [id number] No Place 1 spray into both nostrils 2 (two) times daily as needed for allergies or rhinitis. Dettinger, Fransisca Kaufmann, MD Taking Active   glucose blood  test strip [id number] No Use as instructed Eustaquio Maize, MD Taking Active Spouse/Significant Other  Insulin Glargine [facility] KWIKPEN) 100 UNIT/ML [id number]  Inject 10 Units into the skin daily. [provider]  Active            Med Note Blanca Friend, Marcellus Scott May 06, 2021  2:24 PM) Via lilly cares patient assistance program  Insulin Pen Needle (B-D UF III MINI PEN NEEDLES) 31G X 5 MM MISC [id number] No 1 each by Does not apply route daily. Dettinger, Fransisca Kaufmann, MD Taking Active Spouse/Significant Other  isosorbide mononitrate (IMDUR) 30 MG 24 hr tablet [id number] No TAKE 1 TABLET BY MOUTH EVERY DAY Verta Ellen., NP Taking Active   linaclotide [facility]) 290 MCG CAPS capsule [id number] No Take 1 capsule (290 mcg total) by mouth daily before breakfast. Dettinger, Fransisca Kaufmann, MD Taking Active   methocarbamol (ROBAXIN) 750 MG tablet  [id number] No Take 1 tablet (750 mg total) by mouth at bedtime as needed. Dettinger, Fransisca Kaufmann, MD Taking Active   montelukast (SINGULAIR) 10 MG tablet [id number] No Take 1 tablet (10 mg total) by mouth daily. Dettinger, Fransisca Kaufmann, MD Taking Active   nitroGLYCERIN (NITROSTAT) 0.4 MG SL tablet [id number] No Place 1 tablet (0.4 mg total) under the tongue every 5 (five) minutes as needed. Verta Ellen., NP Taking Active   [facility] LANCETS FINE MISC [id number] No TEST TWICE DAILY Eustaquio Maize, MD Taking Active Spouse/Significant Other  OXYGEN [id number] No Inhale 3-4 L into the lungs at bedtime. [provider] Taking Active Spouse/Significant Other  pantoprazole (PROTONIX) 40 MG tablet [id number]  TAKE 1 TABLET (40 MG TOTAL) BY MOUTH DAILY. TAKE 30-60 MIN BEFORE FIRST MEAL OF THE DAY Tanda Rockers, MD  Active   rosuvastatin (CRESTOR) 20 MG tablet [id number] No TAKE 1 TABLET BY MOUTH EVERY DAY Dettinger, Fransisca Kaufmann, MD Taking Active   traZODone (DESYREL) 50 MG tablet [id number] No Take 1 tablet (50 mg total) by mouth at bedtime as needed for sleep. Dettinger, Fransisca Kaufmann, MD Taking Active   vitamin B-12 (CYANOCOBALAMIN) 1000 MCG tablet [id number] No Take 1,000 mcg by mouth daily. 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   Contusion of left elbow 03/15/2017   Dysmetria    Supplemental oxygen dependent    Hypoalbuminemia  due to protein-calorie malnutrition ([place])    Oropharyngeal dysphagia    Cognitive deficit due to recent stroke 04/23/2016   Frequent falls 04/21/2016   Type 2 diabetes mellitus with peripheral neuropathy (HCC)    Fibromyalgia    Chronic pain syndrome    Chronic obstructive pulmonary disease (HCC)    Gait disturbance, post-stroke    Dysarthria, post-stroke    Dysphagia, post-stroke    Mixed hyperlipidemia    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   Allergic rhinitis 12/17/2015   Primary osteoarthritis of both  knees 11/19/2015   Obesity (BMI 30-39.9) 09/04/2015   Essential hypertension 09/04/2015   Constipation 09/04/2015   Raynaud's phenomenon 08/26/2015   Gastro-esophageal reflux disease with esophagitis 08/26/2015   Spinal stenosis 08/26/2015   Vitamin D deficiency 08/26/2015   DDD (degenerative disc disease), cervical 07/10/2015   Cervical radicular pain 04/30/2013   Knee osteoarthritis 01/05/2012    Immunization History  Administered Date(s) Administered   Influenza Split 02/14/2018   Influenza, High Dose Seasonal PF 03/23/2017   Influenza,inj,Quad PF,6+ Mos 03/21/2016   Influenza-Unspecified 05/01/2012, 06/12/2013, 04/04/2014, 03/17/2018   Moderna Sars-Covid-2 Vaccination 07/19/2019, 08/24/2019, 06/12/2020   Pneumococcal Conjugate-13 04/04/2014   Pneumococcal Polysaccharide-23 04/25/2006, 03/27/2009   Tdap 05/31/2011    Conditions to be addressed/monitored: HTN, HLD, and DMII  Care Plan : PHARMD MEDICATION MANAGEMENT  Updates made by Lavera Guise, RPH since 05/28/2021 12:00 AM     Problem: DISEASE PROGRESSION PREVENTION      Long-Range Goal: T2DM, HTN   Recent Progress: Not on track  Priority: High  Note:   Current Barriers:  Unable to independently afford treatment regimen Unable to achieve control of T2DM   Pharmacist Clinical Goal(s):  patient will verbalize ability to afford treatment regimen achieve control of T2DM as evidenced by  GOAL A1C, IMPROVED GLYCEMIC CONTROL maintain control of T2DM as evidenced by GOAL A1C, IMPROVED GLYCEMIC CONTROL  through collaboration with PharmD and provider.   Interventions: 1:1 collaboration with Dettinger, Fransisca Kaufmann, MD regarding development and update of comprehensive plan of care as evidenced by provider attestation and co-signature Inter-disciplinary care team collaboration (see longitudinal plan of care) Comprehensive medication review performed; medication list updated in electronic medical record  Diabetes: Goal on Track (progressing): YES. Controlled-A1C 6.3%, GFR 53;   Current treatment: BASAGLAR 10 UNITS, TRULICITY 0.53ZJ SQ WEEKLY;  Denies personal and family history of Medullary thyroid cancer (MTC) CONTINUE CURRENT THERAPY Transitioned from [place] to [place] 10 units since able to get free from [employer] patient assistance  Current glucose readings: fasting glucose: <130, post prandial glucose: n/a Denies hypoglycemic/hyperglycemic symptoms Discussed meal planning options and Plate method for healthy eating Avoid sugary drinks and desserts Incorporate balanced protein, non starchy veggies, 1 serving of carbohydrate with each meal Increase water intake Increase physical activity as able Current exercise: n/a Educated on glp1, patient assistance, medications Collaborated with PCP on application Assessed patient finances. Patient approved for l[employer] cares patient assistance until 04/2022--added basaglar    Hypertension:  Goal on Track (progressing): YES. uncontrolled; current treatment:carvedilol, isosorbide Current home readings: [phone] systolics Denies hypotensive/hypertensive symptoms Educated on BP goal <130/80--taking medications as prescribed Recommended continue keeping BP log and will address at follow up  *Application for [place] via [place] patient assistance completed for patient and placed up front*  Patient Goals/Self-Care Activities patient will:   - take medications as prescribed as evidenced by patient report and record review check blood pressure daily, document, and provide at future appointments collaborate with provider on medication access solutions      Medication Assistance:  trulicity/basaglar obtained through [employer] medication assistance program.  Enrollment ends 79/02/40 [employer] application is pending/not complete  Patient's preferred pharmacy is:  [id number] [place]Phone: [phone]: [phone] [place]Phone: [phone]: [phone] Follow Up:  Patient agrees to Care Plan and Follow-up.  Plan: Telephone follow up appointment with care management team member scheduled for:  03/2022   Regina Eck, PharmD, BCPS Clinical Pharmacist, [place]  II Phone [phone]

## 2021-05-31 ENCOUNTER — Other Ambulatory Visit: Payer: Self-pay | Admitting: Family Medicine

## 2021-06-16 DIAGNOSIS — E1142 Type 2 diabetes mellitus with diabetic polyneuropathy: Secondary | ICD-10-CM

## 2021-06-16 DIAGNOSIS — E782 Mixed hyperlipidemia: Secondary | ICD-10-CM

## 2021-06-26 ENCOUNTER — Other Ambulatory Visit: Payer: Self-pay | Admitting: Family Medicine

## 2021-06-27 ENCOUNTER — Other Ambulatory Visit: Payer: Self-pay | Admitting: Internal Medicine

## 2021-06-27 DIAGNOSIS — R058 Other specified cough: Secondary | ICD-10-CM

## 2021-07-01 ENCOUNTER — Other Ambulatory Visit: Payer: Self-pay | Admitting: Family Medicine

## 2021-07-01 DIAGNOSIS — J3089 Other allergic rhinitis: Secondary | ICD-10-CM

## 2021-07-01 DIAGNOSIS — G4700 Insomnia, unspecified: Secondary | ICD-10-CM

## 2021-07-01 NOTE — Telephone Encounter (Signed)
Last office 01/15/21 Upcoming appointment 07/15/21 Trazodone last refill 06/02/20, #90, 3 refills All other meds okay for refill

## 2021-07-02 ENCOUNTER — Other Ambulatory Visit: Payer: Self-pay | Admitting: *Deleted

## 2021-07-02 MED ORDER — ISOSORBIDE MONONITRATE ER 30 MG PO TB24: 30.0000 mg | ORAL_TABLET | Freq: Every day | ORAL | 0 refills | Status: DC

## 2021-07-03 ENCOUNTER — Other Ambulatory Visit: Payer: Self-pay | Admitting: Family Medicine

## 2021-07-08 ENCOUNTER — Other Ambulatory Visit: Payer: Self-pay | Admitting: Internal Medicine

## 2021-07-08 DIAGNOSIS — R058 Other specified cough: Secondary | ICD-10-CM

## 2021-07-15 ENCOUNTER — Ambulatory Visit (INDEPENDENT_AMBULATORY_CARE_PROVIDER_SITE_OTHER): Payer: Medicare Other | Admitting: Family Medicine

## 2021-07-15 ENCOUNTER — Encounter: Payer: Self-pay | Admitting: Family Medicine

## 2021-07-15 VITALS — BP 164/79 | HR 67 | Ht 61.0 in | Wt 203.0 lb

## 2021-07-15 DIAGNOSIS — E782 Mixed hyperlipidemia: Secondary | ICD-10-CM

## 2021-07-15 DIAGNOSIS — Z23 Encounter for immunization: Secondary | ICD-10-CM

## 2021-07-15 DIAGNOSIS — E1142 Type 2 diabetes mellitus with diabetic polyneuropathy: Secondary | ICD-10-CM

## 2021-07-15 DIAGNOSIS — I10 Essential (primary) hypertension: Secondary | ICD-10-CM | POA: Diagnosis not present

## 2021-07-15 LAB — BAYER DCA HB A1C WAIVED: HB A1C (BAYER DCA - WAIVED): 6.4 % — ABNORMAL HIGH (ref 4.8–5.6)

## 2021-07-15 NOTE — Progress Notes (Signed)
? ?BP (!) 164/79   Pulse 67   Ht '5\' 1"'  (1.549 m)   Wt 203 lb (92.1 kg)   SpO2 97%   BMI 38.36 kg/m?   ? ?Subjective:  ? ?Patient ID: Susan Patel, female    DOB: 1941-04-22, 81 y.o.   MRN: [id number] ? ?HPI: ?Susan Patel is a 81 y.o. female presenting on 07/15/2021 for Medical Management of Chronic Issues and Diabetes ? ? ?HPI ?Type 2 diabetes mellitus ?Patient comes in today for recheck of his diabetes. Patient has been currently taking Basaglar and Trulicity and T7G looks good today. Patient is not currently on an ACE inhibitor/ARB. Patient has not seen an ophthalmologist this year. Patient denies any issues with their feet. The symptom started onset as an adult hypertension and hyperlipidemia ARE RELATED TO DM  ? ?Hypertension ?Patient is currently on carvedilol and Imdur, and their blood pressure today is 164/79 and repeat was 149/76. Patient denies any lightheadedness or dizziness. Patient denies headaches, blurred vision, chest pains, shortness of breath, or weakness. Denies any side effects from medication and is content with current medication.  ? ?Hyperlipidemia ?Patient is coming in for recheck of his hyperlipidemia. The patient is currently taking Crestor. They deny any issues with myalgias or history of liver damage from it. They deny any focal numbness or weakness or chest pain.  ? ?Relevant past medical, surgical, family and social history reviewed and updated as indicated. Interim medical history since our last visit reviewed. ?Allergies and medications reviewed and updated. ? ?Review of Systems  ?Constitutional:  Negative for chills and fever.  ?Eyes:  Negative for visual disturbance.  ?Respiratory:  Positive for cough. Negative for chest tightness and shortness of breath.   ?Cardiovascular:  Negative for chest pain and leg swelling.  ?Genitourinary:  Negative for difficulty urinating and dysuria.  ?Musculoskeletal:  Positive for gait problem. Negative for back pain.  ?Skin:  Negative for  rash.  ?Neurological:  Positive for weakness. Negative for dizziness, light-headedness and headaches.  ?Psychiatric/Behavioral:  Negative for agitation and behavioral problems.   ?All other systems reviewed and are negative. ? ?Per HPI unless specifically indicated above ? ? ?Allergies as of 07/15/2021   ? ?   Reactions  ? Azithromycin Other (See Comments)  ? Renal failure  ? Prednisone Other (See Comments)  ? Irritability and insomnia  ? Sulfamethoxazole-trimethoprim Other (See Comments)  ? Renal failure  ? Amlodipine Swelling  ? Caused swelling in legs  ? Ciprofloxacin Itching, Rash  ? Tape Rash  ? ?  ? ?  ?Medication List  ?  ? ?  ? Accurate as of July 15, 2021  2:15 PM. If you have any questions, ask your nurse or doctor.  ?  ?  ? ?  ? ?acetaminophen 325 MG tablet ?Commonly known as: Tylenol ?Take 2 tablets (650 mg total) by mouth every 6 (six) hours as needed. ?  ?albuterol 108 (90 Base) MCG/ACT inhaler ?Commonly known as: VENTOLIN HFA ?INHALE 1-2 PUFFS BY MOUTH EVERY 6 HOURS AS NEEDED FOR WHEEZE OR SHORTNESS OF BREATH ?  ?aspirin 81 MG EC tablet ?Commonly known as: [facility] Aspirin Low Strength ?Take 1 tablet (81 mg total) by mouth daily. ?  ?B-D UF III MINI PEN NEEDLES 31G X 5 MM Misc ?Generic drug: Insulin Pen Needle ?1 each by Does not apply route daily. ?  ?Basaglar KwikPen 100 UNIT/ML ?Inject 10 Units into the skin daily. ?  ?carvedilol 12.5 MG tablet ?Commonly known as: COREG ?  TAKE 1 TABLET BY MOUTH TWICE A DAY ?  ?famotidine 20 MG tablet ?Commonly known as: PEPCID ?TAKE 1 TABLET BY MOUTH EVERY DAY AFTER SUPPER ?  ?fluticasone 50 MCG/ACT nasal spray ?Commonly known as: FLONASE ?Place 1 spray into both nostrils 2 (two) times daily as needed for allergies or rhinitis. ?  ?glucose blood test strip ?Use as instructed ?  ?isosorbide mononitrate 30 MG 24 hr tablet ?Commonly known as: IMDUR ?Take 1 tablet (30 mg total) by mouth daily. ?  ?linaclotide 290 MCG Caps capsule ?Commonly known as: Linzess ?Take 1 capsule  (290 mcg total) by mouth daily before breakfast. ?  ?methocarbamol 750 MG tablet ?Commonly known as: ROBAXIN ?Take 1 tablet (750 mg total) by mouth at bedtime as needed. ?  ?montelukast 10 MG tablet ?Commonly known as: SINGULAIR ?TAKE 1 TABLET BY MOUTH EVERY DAY ?  ?nitroGLYCERIN 0.4 MG SL tablet ?Commonly known as: NITROSTAT ?Place 1 tablet (0.4 mg total) under the tongue every 5 (five) minutes as needed. ?  ?OneTouch Delica Lancets Fine Misc ?TEST TWICE DAILY ?  ?OXYGEN ?Inhale 3-4 L into the lungs at bedtime. ?  ?pantoprazole 40 MG tablet ?Commonly known as: PROTONIX ?TAKE 1 TABLET (40 MG TOTAL) BY MOUTH DAILY. TAKE 30-60 MIN BEFORE FIRST MEAL OF THE DAY ?  ?rosuvastatin 20 MG tablet ?Commonly known as: CRESTOR ?TAKE 1 TABLET BY MOUTH EVERY DAY ?  ?traZODone 50 MG tablet ?Commonly known as: DESYREL ?TAKE 1 TABLET BY MOUTH AT BEDTIME AS NEEDED FOR SLEEP. ?  ?Trulicity 9.67 RF/1.6BW Sopn ?Generic drug: Dulaglutide ?Inject 0.75 mg into the skin once a week. ?  ?vitamin B-12 1000 MCG tablet ?Commonly known as: CYANOCOBALAMIN ?Take 1,000 mcg by mouth daily. ?  ? ?  ? ? ? ?Objective:  ? ?BP (!) 164/79   Pulse 67   Ht '5\' 1"'  (1.549 m)   Wt 203 lb (92.1 kg)   SpO2 97%   BMI 38.36 kg/m?   ?Wt Readings from Last 3 Encounters:  ?07/15/21 203 lb (92.1 kg)  ?04/27/21 199 lb (90.3 kg)  ?01/15/21 199 lb (90.3 kg)  ?  ?Physical Exam ?Vitals and nursing note reviewed.  ?Constitutional:   ?   General: She is not in acute distress. ?   Appearance: She is well-developed. She is not diaphoretic.  ?Eyes:  ?   Conjunctiva/sclera: Conjunctivae normal.  ?Cardiovascular:  ?   Rate and Rhythm: Normal rate and regular rhythm.  ?   Heart sounds: Normal heart sounds. No murmur heard. ?Pulmonary:  ?   Effort: Pulmonary effort is normal. No respiratory distress.  ?   Breath sounds: Normal breath sounds. No wheezing.  ?Musculoskeletal:     ?   General: No swelling.  ?Skin: ?   General: Skin is warm and dry.  ?   Findings: No rash.   ?Neurological:  ?   Mental Status: She is alert and oriented to person, place, and time.  ?   Coordination: Coordination normal.  ?Psychiatric:     ?   Behavior: Behavior normal.  ? ? ? ? ?Assessment & Plan:  ? ?Problem List Items Addressed This Visit   ? ?  ? Cardiovascular and Mediastinum  ? Essential hypertension  ? Relevant Orders  ? CBC with Differential/Platelet  ? CMP14+EGFR  ? Lipid panel  ? Bayer DCA Hb A1c Waived  ?  ? Endocrine  ? 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 Relevant Orders  ? CBC  with Differential/Platelet  ? CMP14+EGFR  ? Lipid panel  ? Bayer DCA Hb A1c Waived  ?  ? Other  ? Mixed hyperlipidemia  ? Relevant Orders  ? CBC with Differential/Platelet  ? CMP14+EGFR  ? Lipid panel  ? Bayer DCA Hb A1c Waived  ? ?Other Visit Diagnoses   ? ? Need for Tdap vaccination      ? Relevant Orders  ? Tdap vaccine greater than or equal to 7yo IM (Completed)  ? Need for immunization against influenza      ? Relevant Orders  ? Flu Vaccine QUAD High Dose(Fluad) (Completed)  ? ?  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 ? ?A1c looks good at 6.4.  No change of medication. ? ? ? ?Follow up plan: ?Return in about 6 months (around 01/15/2022) for Diabetes and hyperlipidemia and hypertension. ? ?Counseling provided for all of the vaccine components ?Orders Placed This Encounter  ?Procedures  ? Tdap vaccine greater than or equal to 7yo IM  ? Flu Vaccine QUAD High Dose(Fluad)  ? CBC with Differential/Platelet  ? CMP14+EGFR  ? Lipid panel  ? Bayer DCA Hb A1c Waived  ? ? ?Caryl Pina, MD ?[place] ?07/15/2021, 2:15 PM ? ? ? ? ?

## 2021-07-16 LAB — CBC WITH DIFFERENTIAL/PLATELET
Basophils Absolute: 0.1 10*3/uL (ref 0.0–0.2)
Basos: 2 %
EOS (ABSOLUTE): 0.2 10*3/uL (ref 0.0–0.4)
Eos: 4 %
Hematocrit: 39.7 % (ref 34.0–46.6)
Hemoglobin: 13 g/dL (ref 11.1–15.9)
Immature Grans (Abs): 0 10*3/uL (ref 0.0–0.1)
Immature Granulocytes: 0 %
Lymphocytes Absolute: 1.5 10*3/uL (ref 0.7–3.1)
Lymphs: 28 %
MCH: 29.4 pg (ref 26.6–33.0)
MCHC: 32.7 g/dL (ref 31.5–35.7)
MCV: 90 fL (ref 79–97)
Monocytes Absolute: 0.6 10*3/uL (ref 0.1–0.9)
Monocytes: 12 %
Neutrophils Absolute: 2.9 10*3/uL (ref 1.4–7.0)
Neutrophils: 54 %
Platelets: 253 10*3/uL (ref 150–450)
RBC: 4.42 x10E6/uL (ref 3.77–5.28)
RDW: 13.1 % (ref 11.7–15.4)
WBC: 5.3 10*3/uL (ref 3.4–10.8)

## 2021-07-16 LAB — LIPID PANEL
Chol/HDL Ratio: 3 ratio (ref 0.0–4.4)
Cholesterol, Total: 137 mg/dL (ref 100–199)
HDL: 45 mg/dL (ref >39)
LDL Chol Calc (NIH): 73 mg/dL (ref 0–99)
Triglycerides: 105 mg/dL (ref 0–149)
VLDL Cholesterol Cal: 19 mg/dL (ref 5–40)

## 2021-07-16 LAB — CMP14+EGFR
ALT: 9 IU/L (ref 0–32)
AST: 10 IU/L (ref 0–40)
Albumin/Globulin Ratio: 1.7 (ref 1.2–2.2)
Albumin: 4 g/dL (ref 3.7–4.7)
Alkaline Phosphatase: 125 IU/L — ABNORMAL HIGH (ref 44–121)
BUN/Creatinine Ratio: 19 (ref 12–28)
BUN: 19 mg/dL (ref 8–27)
Bilirubin Total: 0.5 mg/dL (ref 0.0–1.2)
CO2: 24 mmol/L (ref 20–29)
Calcium: 9 mg/dL (ref 8.7–10.3)
Chloride: 108 mmol/L — ABNORMAL HIGH (ref 96–106)
Creatinine, Ser: 1 mg/dL (ref 0.57–1.00)
Globulin, Total: 2.3 g/dL (ref 1.5–4.5)
Glucose: 112 mg/dL — ABNORMAL HIGH (ref 70–99)
Potassium: 4.7 mmol/L (ref 3.5–5.2)
Sodium: 144 mmol/L (ref 134–144)
Total Protein: 6.3 g/dL (ref 6.0–8.5)
eGFR: 58 mL/min/{1.73_m2} — ABNORMAL LOW (ref >59)

## 2021-07-22 ENCOUNTER — Other Ambulatory Visit: Payer: Self-pay | Admitting: Family Medicine

## 2021-07-23 ENCOUNTER — Other Ambulatory Visit: Payer: Self-pay | Admitting: Family Medicine

## 2021-07-23 DIAGNOSIS — R058 Other specified cough: Secondary | ICD-10-CM

## 2021-08-25 ENCOUNTER — Encounter: Payer: Self-pay | Admitting: Cardiology

## 2021-08-25 ENCOUNTER — Ambulatory Visit (INDEPENDENT_AMBULATORY_CARE_PROVIDER_SITE_OTHER): Payer: Medicare Other | Admitting: Cardiology

## 2021-08-25 VITALS — BP 128/84 | HR 72 | Ht 61.0 in | Wt 198.0 lb

## 2021-08-25 DIAGNOSIS — I10 Essential (primary) hypertension: Secondary | ICD-10-CM

## 2021-08-25 DIAGNOSIS — E782 Mixed hyperlipidemia: Secondary | ICD-10-CM

## 2021-08-25 DIAGNOSIS — I2510 Atherosclerotic heart disease of native coronary artery without angina pectoris: Secondary | ICD-10-CM | POA: Diagnosis not present

## 2021-08-25 NOTE — Addendum Note (Signed)
Addended by: Barbarann Ehlers A on: 08/25/2021 01:26 PM ? ? Modules accepted: Orders ? ?

## 2021-08-25 NOTE — Progress Notes (Signed)
? ?Cardiology CONSULT Note   ? ?Date:  08/25/2021  ? ?ID:  Susan Patel, DOB [birth year], MRN [id number] ? ?PCP:  Dettinger, Fransisca Kaufmann, MD  ?Cardiologist:  Fransico Him, MD  ? ?Chief Complaint  ?Patient presents with  ? Coronary Artery Disease  ? Hypertension  ? Hyperlipidemia  ? ? ?History of Present Illness:  ?Susan Patel is a 81 y.o. female who is being seen today for the evaluation of CAD, HTN, HLD and to reestablish cardiac care at the request of Dettinger, Fransisca Kaufmann, MD. ? ?This is a 81yo female with a hx of multivessel CAD with 60% dLM into ostium of LCx, 30% pLAD, 50% oD1, 30% mLAD,50-70% mid to dLAD, 20% pLCx and occluded proximal RCA with bridging antegrade collaterals and L>R retrograde collaterals to the dRCA/PDA/PLA.  Medical management was recommended at the time of cath 05/2018 and deemed not a candidate for CABG due to prior CVA with significant deficits and minimal cardiac symptoms and patient did not want heart surgery.   She also has a remote hx of CVA. ? ?She is here today and is doing well.  She has chronic DOE and LE edema which are very stable.  She denies any chest pain or pressure, PND, orthopnea, LE edema, dizziness, palpitations or syncope. She is compliant with her meds and is tolerating meds with no SE.    ? ?Past Medical History:  ?Diagnosis Date  ? Arthritis   ? 'all over"  ? Asthma   ? CAD (coronary artery disease)   ? Multivessel CAD, not a candidate for CABG, managing medically.  ? Chronic pain   ? COPD (chronic obstructive pulmonary disease) ([place])   ? O2 per [place] at nights   ? Depression   ? Diabetes mellitus without complication ([place])   ? borderline- , states she was on med., but MD told her "everything is under control so I threw the bottle away"  ? Fibromyalgia   ? GERD (gastroesophageal reflux disease)   ? Hypertension   ? Neuromuscular disorder ([place])   ? parkinson, neuropathy- both feet & hands.  ? Stroke [facility])   ? residual dysarthria  ? ? ?Past Surgical History:  ?Procedure  Laterality Date  ? ABDOMINAL HYSTERECTOMY    ? ANTERIOR CERVICAL DECOMP/DISCECTOMY FUSION N/A 07/10/2015  ? Procedure: Cervical five-six, Cervical six-seven anterior cervical decompression with fusion plating and bonegraft;  Surgeon: Jovita Gamma, MD;  Location: [facility];  Service: Neurosurgery;  Laterality: N/A;  ? APPENDECTOMY    ? BIOPSY EYE MUSCLE  03/20/2016  ? biopsy vessel to right eye due to swelling  ? EYE SURGERY Bilateral   ? cataracts removed, /w "cyrstal lenses"   ? LEFT HEART CATH AND CORONARY ANGIOGRAPHY N/A 05/23/2018  ? Procedure: LEFT HEART CATH AND CORONARY ANGIOGRAPHY;  Surgeon: Troy Sine, MD;  Location: [facility];  Service: Cardiovascular;  Laterality: N/A;  ? SHOULDER ARTHROSCOPY Right   ? x2   RCR- spurs removed   ? ? ?Current Medications: ?Current Meds  ?Medication Sig  ? acetaminophen (TYLENOL) 325 MG tablet Take 2 tablets (650 mg total) by mouth every 6 (six) hours as needed.  ? albuterol (VENTOLIN HFA) 108 (90 Base) MCG/ACT inhaler INHALE 1-2 PUFFS BY MOUTH EVERY 6 HOURS AS NEEDED FOR WHEEZE OR SHORTNESS OF BREATH  ? aspirin ([employer]S ASPIRIN LOW STRENGTH) 81 MG EC tablet Take 1 tablet (81 mg total) by mouth daily.  ? carvedilol (COREG) 12.5 MG tablet TAKE 1  TABLET BY MOUTH TWICE A DAY  ? Dulaglutide (TRULICITY) 2.97 LG/9.2JJ SOPN Inject 0.75 mg into the skin once a week.  ? famotidine (PEPCID) 20 MG tablet TAKE 1 TABLET BY MOUTH EVERY DAY AFTER SUPPER  ? fluticasone (FLONASE) 50 MCG/ACT nasal spray Place 1 spray into both nostrils 2 (two) times daily as needed for allergies or rhinitis.  ? glucose blood test strip Use as instructed  ? Insulin Glargine (BASAGLAR KWIKPEN) 100 UNIT/ML Inject 10 Units into the skin daily.  ? Insulin Pen Needle (B-D UF III MINI PEN NEEDLES) 31G X 5 MM MISC 1 each by Does not apply route daily.  ? isosorbide mononitrate (IMDUR) 30 MG 24 hr tablet Take 1 tablet (30 mg total) by mouth daily.  ? linaclotide (LINZESS) 290 MCG CAPS capsule Take 1  capsule (290 mcg total) by mouth daily before breakfast.  ? methocarbamol (ROBAXIN) 750 MG tablet Take 1 tablet (750 mg total) by mouth at bedtime as needed.  ? montelukast (SINGULAIR) 10 MG tablet TAKE 1 TABLET BY MOUTH EVERY DAY  ? nitroGLYCERIN (NITROSTAT) 0.4 MG SL tablet Place 1 tablet (0.4 mg total) under the tongue every 5 (five) minutes as needed.  ? ONETOUCH DELICA LANCETS FINE MISC TEST TWICE DAILY  ? OXYGEN Inhale 3-4 L into the lungs at bedtime.  ? pantoprazole (PROTONIX) 40 MG tablet TAKE 1 TABLET (40 MG TOTAL) BY MOUTH DAILY. TAKE 30-60 MIN BEFORE FIRST MEAL OF THE DAY  ? rosuvastatin (CRESTOR) 20 MG tablet TAKE 1 TABLET BY MOUTH EVERY DAY  ? traZODone (DESYREL) 50 MG tablet TAKE 1 TABLET BY MOUTH AT BEDTIME AS NEEDED FOR SLEEP.  ? vitamin B-12 (CYANOCOBALAMIN) 1000 MCG tablet Take 1,000 mcg by mouth daily.  ? ? ?Allergies:   Azithromycin, Prednisone, Sulfamethoxazole-trimethoprim, Amlodipine, Ciprofloxacin, and Tape  ? ?Social History  ? ?Socioeconomic History  ? Marital status: Married  ?  Spouse name: wayne  ? Number of children: 2  ? Years of education: 83  ? Highest education level: Bachelor's degree (e.g., BA, AB, BS)  ?Occupational History  ? Occupation: retired  ?  Comment: elementary school teacher  ?Tobacco Use  ? Smoking status: Never  ? Smokeless tobacco: Never  ?Vaping Use  ? Vaping Use: Never used  ?Substance and Sexual Activity  ? Alcohol use: No  ? Drug use: No  ? Sexual activity: Yes  ?Other Topics Concern  ? Not on file  ?Social History Narrative  ? Lives home with her husband  ? Handicap accessible bathroom  ? She has had several strokes - left her with impaired balance, memory, speech  ? Her husband takes care of cooking, cleaning, driving, etc  ? ?Social Determinants of Health  ? ?Financial Resource Strain: Low Risk   ? Difficulty of Paying Living Expenses: Not hard at all  ?Food Insecurity: No Food Insecurity  ? Worried About Charity fundraiser in the Last Year: Never true  ? Ran  Out of Food in the Last Year: Never true  ?Transportation Needs: No Transportation Needs  ? Lack of Transportation (Medical): No  ? Lack of Transportation (Non-Medical): No  ?Physical Activity: Inactive  ? Days of Exercise per Week: 0 days  ? Minutes of Exercise per Session: 0 min  ?Stress: No Stress Concern Present  ? Feeling of Stress : Only a little  ?Social Connections: Socially Integrated  ? Frequency of Communication with Friends and Family: Twice a week  ? Frequency of Social Gatherings with Friends and  Family: Twice a week  ? Attends Religious Services: More than 4 times per year  ? Active Member of Clubs or Organizations: Yes  ? Attends Archivist Meetings: More than 4 times per year  ? Marital Status: Married  ?  ? ?Family History:  The patient's family history includes Arthritis in her sister and sister; Atrial fibrillation in her son; Cancer in her brother and father; Heart disease in her father and mother; Heart failure in her sister; Thyroid cancer in her son.  ? ?ROS:   ?Please see the history of present illness.    ?ROS All other systems reviewed and are negative. ? ? ?  11/10/2017  ?  3:47 PM  ?PAD Screen  ?Previous PAD dx? No  ?Previous surgical procedure? No  ?Pain with walking? No  ?Feet/toe relief with dangling? No  ?Painful, non-healing ulcers? No  ?Extremities discolored? No  ? ? ? ? ? ?PHYSICAL EXAM:   ?VS:  BP 128/84   Pulse 72   Ht '5\' 1"'$  (1.549 m)   Wt 198 lb (89.8 kg)   SpO2 97%   BMI 37.41 kg/m?    ?GEN: Well nourished, well developed, in no acute distress  ?HEENT: normal  ?Neck: no JVD, carotid bruits, or masses ?Cardiac: RRR; no murmurs, rubs, or gallops,no edema.  Intact distal pulses bilaterally.  ?Respiratory:  clear to auscultation bilaterally, normal work of breathing ?GI: soft, nontender, nondistended, + BS ?MS: no deformity or atrophy  ?Skin: warm and dry, no rash ?Neuro:  Alert and Oriented x 3, Strength and sensation are intact ?Psych: euthymic mood, full  affect ? ?Wt Readings from Last 3 Encounters:  ?08/25/21 198 lb (89.8 kg)  ?07/15/21 203 lb (92.1 kg)  ?04/27/21 199 lb (90.3 kg)  ?  ? ? ?Studies/Labs Reviewed:  ? ?EKG:  EKG is ordered today.  The ekg ordered today dem

## 2021-08-25 NOTE — Patient Instructions (Signed)
Medication Instructions:  ?Your physician recommends that you continue on your current medications as directed. Please refer to the Current Medication list given to you today. ? ? ?Labwork: ?None today ? ?Testing/Procedures: ?Your physician has requested that you have an echocardiogram. Echocardiography is a painless test that uses sound waves to create images of your heart. It provides your doctor with information about the size and shape of your heart and how well your heart?s chambers and valves are working. This procedure takes approximately one hour. There are no restrictions for this procedure. ? ? ?Follow-Up: ?1 year ? ?Any Other Special Instructions Will Be Listed Below (If Applicable). ? ?If you need a refill on your cardiac medications before your next appointment, please call your pharmacy. ? ?

## 2021-09-09 ENCOUNTER — Telehealth: Payer: Self-pay

## 2021-09-09 NOTE — Telephone Encounter (Signed)
Received notification from [place] regarding approval for [facility]. Patient assistance approved from 08/25/21 to 05/16/22. ? ?90-DAY SUPPLY DELIVERED TO PT'S HOME 09/07/21 ? ?Phone: [phone] ? ?

## 2021-09-14 ENCOUNTER — Ambulatory Visit (HOSPITAL_COMMUNITY)
Admission: RE | Admit: 2021-09-14 | Discharge: 2021-09-14 | Disposition: A | Discharge status: Home / Self Care | Payer: Medicare Other | Source: Ambulatory Visit | Attending: Cardiology | Admitting: Cardiology

## 2021-09-14 DIAGNOSIS — I2510 Atherosclerotic heart disease of native coronary artery without angina pectoris: Secondary | ICD-10-CM | POA: Diagnosis not present

## 2021-09-14 LAB — ECHOCARDIOGRAM COMPLETE
AR max vel: 1.87 cm2
AV Area VTI: 1.79 cm2
AV Area mean vel: 1.74 cm2
AV Mean grad: 3 mmHg
AV Peak grad: 6.3 mmHg
Ao pk vel: 1.25 m/s
Area-P 1/2: 2.52 cm2
Calc EF: 34.3 %
MV VTI: 1.8 cm2
S' Lateral: 4.6 cm
Single Plane A2C EF: 33.8 %
Single Plane A4C EF: 35.4 %

## 2021-09-14 NOTE — Progress Notes (Signed)
*  PRELIMINARY RESULTS* ?Echocardiogram ?2D Echocardiogram has been performed. ? ?Susan Patel ?09/14/2021, 3:07 PM ?

## 2021-09-15 ENCOUNTER — Telehealth: Payer: Self-pay

## 2021-09-15 NOTE — Telephone Encounter (Signed)
Patient notified and verbalized understanding. Pt had no questions or concerns at this time 

## 2021-09-15 NOTE — Telephone Encounter (Signed)
-----   Message from Sueanne Margarita, MD sent at 09/14/2021  4:03 PM EDT ----- ?Echo showed low normal LVF with moderately thickened heart muscle, trivial leakiness of AV and MV which is normal ?

## 2021-10-10 ENCOUNTER — Other Ambulatory Visit: Payer: Self-pay | Admitting: Family Medicine

## 2021-10-10 DIAGNOSIS — J3089 Other allergic rhinitis: Secondary | ICD-10-CM

## 2021-11-03 ENCOUNTER — Other Ambulatory Visit: Payer: Self-pay | Admitting: Internal Medicine

## 2021-11-03 ENCOUNTER — Other Ambulatory Visit: Payer: Self-pay | Admitting: Family Medicine

## 2021-11-03 DIAGNOSIS — J3089 Other allergic rhinitis: Secondary | ICD-10-CM

## 2021-11-03 DIAGNOSIS — R058 Other specified cough: Secondary | ICD-10-CM

## 2021-12-01 ENCOUNTER — Other Ambulatory Visit: Payer: Self-pay | Admitting: Family Medicine

## 2021-12-08 ENCOUNTER — Other Ambulatory Visit: Payer: Self-pay | Admitting: Family Medicine

## 2021-12-09 ENCOUNTER — Other Ambulatory Visit: Payer: Self-pay | Admitting: Student

## 2022-02-13 ENCOUNTER — Other Ambulatory Visit: Payer: Self-pay | Admitting: Internal Medicine

## 2022-02-13 ENCOUNTER — Other Ambulatory Visit: Payer: Self-pay | Admitting: Family Medicine

## 2022-02-13 DIAGNOSIS — J3089 Other allergic rhinitis: Secondary | ICD-10-CM

## 2022-02-13 DIAGNOSIS — R058 Other specified cough: Secondary | ICD-10-CM

## 2022-03-09 ENCOUNTER — Other Ambulatory Visit: Payer: Self-pay | Admitting: Family Medicine

## 2022-03-09 NOTE — Telephone Encounter (Signed)
Dettinger NTBS 30 days given 02/15/22

## 2022-03-10 MED ORDER — ROSUVASTATIN CALCIUM 20 MG PO TABS: 20.0000 mg | ORAL_TABLET | Freq: Every day | ORAL | 0 refills | Status: DC

## 2022-03-10 NOTE — Addendum Note (Signed)
Addended by: Antonietta Barcelona D on: 03/10/2022 10:25 AM   Modules accepted: Orders

## 2022-03-10 NOTE — Telephone Encounter (Signed)
I called pt & made an appt w/Dettinger for tomorrow at 4:10pm.

## 2022-03-11 ENCOUNTER — Ambulatory Visit (INDEPENDENT_AMBULATORY_CARE_PROVIDER_SITE_OTHER): Payer: Medicare Other | Admitting: Family Medicine

## 2022-03-11 ENCOUNTER — Encounter: Payer: Self-pay | Admitting: Family Medicine

## 2022-03-11 VITALS — BP 162/80 | HR 71 | Temp 97.0°F | Ht 61.0 in | Wt 190.0 lb

## 2022-03-11 DIAGNOSIS — J410 Simple chronic bronchitis: Secondary | ICD-10-CM

## 2022-03-11 DIAGNOSIS — J3089 Other allergic rhinitis: Secondary | ICD-10-CM

## 2022-03-11 DIAGNOSIS — E1142 Type 2 diabetes mellitus with diabetic polyneuropathy: Secondary | ICD-10-CM

## 2022-03-11 DIAGNOSIS — I5022 Chronic systolic (congestive) heart failure: Secondary | ICD-10-CM

## 2022-03-11 DIAGNOSIS — I10 Essential (primary) hypertension: Secondary | ICD-10-CM | POA: Diagnosis not present

## 2022-03-11 DIAGNOSIS — E782 Mixed hyperlipidemia: Secondary | ICD-10-CM

## 2022-03-11 MED ORDER — DEXCOM G6 RECEIVER DEVI: 1.0000 | Freq: Four times a day (QID) | 3 refills | Status: DC

## 2022-03-11 MED ORDER — TRULICITY 0.75 MG/0.5ML ~~LOC~~ SOAJ: 0.7500 mg | SUBCUTANEOUS | 3 refills | Status: DC

## 2022-03-11 MED ORDER — DEXCOM G6 SENSOR MISC: 1.0000 | 11 refills | Status: DC

## 2022-03-11 MED ORDER — ROSUVASTATIN CALCIUM 20 MG PO TABS: 20.0000 mg | ORAL_TABLET | Freq: Every day | ORAL | 3 refills | Status: DC

## 2022-03-11 MED ORDER — CARVEDILOL 12.5 MG PO TABS: 12.5000 mg | ORAL_TABLET | Freq: Two times a day (BID) | ORAL | 3 refills | Status: DC

## 2022-03-11 MED ORDER — MONTELUKAST SODIUM 10 MG PO TABS: 10.0000 mg | ORAL_TABLET | Freq: Every day | ORAL | 3 refills | Status: DC

## 2022-03-11 MED ORDER — DEXCOM G6 TRANSMITTER MISC: 1.0000 | Freq: Four times a day (QID) | 3 refills | Status: DC

## 2022-03-11 NOTE — Progress Notes (Signed)
BP (!) 162/80   Pulse 71   Temp (!) 97 F (36.1 C)   Ht _0  (1.549 m)   Wt 190 lb (86.2 kg)   SpO2 96%   BMI 35.90 kg/m    Subjective:   Patient ID: Susan Patel, female    DOB: [birth year], 81 y.o.   MRN: [id number]  HPI: Susan Patel is a 81 y.o. female presenting on 03/11/2022 for Medical Management of Chronic Issues, Diabetes (Wants dexcom/), Hypertension, and Hyperlipidemia   HPI Type 2 diabetes mellitus Patient comes in today for recheck of his diabetes. Patient has been currently taking Trulicity and [employer], agricultural. Patient is not currently on an ACE inhibitor/ARB. Patient has not seen an ophthalmologist this year. Patient denies any issues with their feet. The symptom started onset as an adult hypertension and hyperlipidemia ARE RELATED TO DM   Hypertension and CHF Patient is currently on Imdur and carvedilol, and their blood pressure today is 162/80. Patient denies any lightheadedness or dizziness. Patient denies headaches, blurred vision, chest pains, shortness of breath, or weakness. Denies any side effects from medication and is content with current medication.   Hyperlipidemia Patient is coming in for recheck of his hyperlipidemia. The patient is currently taking Crestor. They deny any issues with myalgias or history of liver damage from it. They deny any focal numbness or weakness or chest pain.   COPD Patient is coming in for COPD recheck today.  He is currently on albuterol and Singulair.  He has a mild chronic cough but denies any major coughing spells or wheezing spells.  He has 0 nighttime symptoms per week and 0 daytime symptoms per week currently.   Relevant past medical, surgical, family and social history reviewed and updated as indicated. Interim medical history since our last visit reviewed. Allergies and medications reviewed and updated.  Review of Systems  Constitutional:  Negative for chills and fever.  Eyes:  Negative for visual disturbance.   Respiratory:  Negative for chest tightness and shortness of breath.   Cardiovascular:  Negative for chest pain and leg swelling.  Musculoskeletal:  Negative for back pain and gait problem.  Skin:  Negative for rash.  Neurological:  Negative for dizziness, light-headedness and headaches.  Psychiatric/Behavioral:  Negative for agitation and behavioral problems.   All other systems reviewed and are negative.   Per HPI unless specifically indicated above   Allergies as of 03/11/2022       Reactions   Azithromycin Other (See Comments)   Renal failure   Prednisone Other (See Comments)   Irritability and insomnia   Sulfamethoxazole-trimethoprim Other (See Comments)   Renal failure   Amlodipine Swelling   Caused swelling in legs   Ciprofloxacin Itching, Rash   Tape Rash        Medication List        Accurate as of March 11, 2022  5:04 PM. If you have any questions, ask your nurse or doctor.          acetaminophen 325 MG tablet Commonly known as: Tylenol Take 2 tablets (650 mg total) by mouth every 6 (six) hours as needed.   albuterol 108 (90 Base) MCG/ACT inhaler Commonly known as: VENTOLIN HFA INHALE 1-2 PUFFS BY MOUTH EVERY 6 HOURS AS NEEDED FOR WHEEZE OR SHORTNESS OF BREATH   B-D UF III MINI PEN NEEDLES 31G X 5 MM Misc Generic drug: Insulin Pen Needle USE DAILY AS DIRECTED   Basaglar KwikPen 100 UNIT/ML Inject 10 Units into  the skin daily.   carvedilol 12.5 MG tablet Commonly known as: COREG Take 1 tablet (12.5 mg total) by mouth 2 (two) times daily.   [facility] Aspirin Low Strength 81 MG tablet Generic drug: aspirin EC TAKE 1 TABLET BY MOUTH EVERY DAY   cyanocobalamin 1000 MCG tablet Commonly known as: VITAMIN B12 Take 1,000 mcg by mouth daily.   Dexcom G6 Receiver Devi 1 each by Does not apply route 4 (four) times daily. Started by: Worthy Rancher, MD   Dexcom G6 Sensor Misc 1 each by Does not apply route once a week. Started by: Worthy Rancher, MD   Dexcom G6 Transmitter Misc 1 each by Does not apply route 4 (four) times daily. Started by: Worthy Rancher, MD   famotidine 20 MG tablet Commonly known as: PEPCID TAKE 1 TABLET BY MOUTH EVERY DAY AFTER SUPPER   fluticasone 50 MCG/ACT nasal spray Commonly known as: FLONASE Place 1 spray into both nostrils 2 (two) times daily as needed for allergies or rhinitis.   glucose blood test strip Use as instructed   isosorbide mononitrate 30 MG 24 hr tablet Commonly known as: IMDUR TAKE 1 TABLET BY MOUTH EVERY DAY   linaclotide 290 MCG Caps capsule Commonly known as: Linzess Take 1 capsule (290 mcg total) by mouth daily before breakfast.   methocarbamol 750 MG tablet Commonly known as: ROBAXIN Take 1 tablet (750 mg total) by mouth at bedtime as needed.   montelukast 10 MG tablet Commonly known as: SINGULAIR Take 1 tablet (10 mg total) by mouth daily.   nitroGLYCERIN 0.4 MG SL tablet Commonly known as: NITROSTAT Place 1 tablet (0.4 mg total) under the tongue every 5 (five) minutes as needed.   OneTouch Delica Lancets Fine Misc TEST TWICE DAILY   OXYGEN Inhale 3-4 L into the lungs at bedtime.   pantoprazole 40 MG tablet Commonly known as: PROTONIX TAKE 1 TABLET (40 MG TOTAL) BY MOUTH DAILY. TAKE 30-60 MIN BEFORE FIRST MEAL OF THE DAY   rosuvastatin 20 MG tablet Commonly known as: CRESTOR Take 1 tablet (20 mg total) by mouth daily.   traZODone 50 MG tablet Commonly known as: DESYREL TAKE 1 TABLET BY MOUTH AT BEDTIME AS NEEDED FOR SLEEP.   Trulicity 7.12 RF/7.5OI Sopn Generic drug: Dulaglutide Inject 0.75 mg into the skin once a week.         Objective:   BP (!) 162/80   Pulse 71   Temp (!) 97 F (36.1 C)   Ht _0  (1.549 m)   Wt 190 lb (86.2 kg)   SpO2 96%   BMI 35.90 kg/m   Wt Readings from Last 3 Encounters:  03/11/22 190 lb (86.2 kg)  08/25/21 198 lb (89.8 kg)  07/15/21 203 lb (92.1 kg)    Physical Exam Vitals and nursing  note reviewed.  Constitutional:      General: She is not in acute distress.    Appearance: She is well-developed. She is not diaphoretic.  Eyes:     Conjunctiva/sclera: Conjunctivae normal.     Pupils: Pupils are equal, round, and reactive to light.  Cardiovascular:     Rate and Rhythm: Normal rate and regular rhythm.     Heart sounds: Normal heart sounds. No murmur heard. Pulmonary:     Effort: Pulmonary effort is normal. No respiratory distress.     Breath sounds: Normal breath sounds. No wheezing.  Musculoskeletal:        General: No tenderness. Normal range of  motion.  Skin:    General: Skin is warm and dry.     Findings: No rash.  Neurological:     Mental Status: She is alert and oriented to person, place, and time.     Coordination: Coordination normal.  Psychiatric:        Behavior: Behavior normal.       Assessment & Plan:   Problem List Items Addressed This Visit       Cardiovascular and Mediastinum   Essential hypertension   Relevant Medications   carvedilol (COREG) 12.5 MG tablet   rosuvastatin (CRESTOR) 20 MG tablet   Other Relevant Orders   CBC with Differential/Platelet   CMP14+EGFR   Lipid panel   Bayer DCA Hb A1c Waived   Chronic systolic CHF (congestive heart failure) (HCC)   Relevant Medications   carvedilol (COREG) 12.5 MG tablet   rosuvastatin (CRESTOR) 20 MG tablet     Respiratory   Chronic obstructive pulmonary disease (HCC)   Relevant Medications   montelukast (SINGULAIR) 10 MG tablet   Allergic rhinitis   Relevant Medications   montelukast (SINGULAIR) 10 MG tablet     Endocrine   Type 2 diabetes mellitus with peripheral neuropathy (HCC) - Primary   Relevant Medications   Dulaglutide (TRULICITY) 9.16 XI/5.0TU SOPN   rosuvastatin (CRESTOR) 20 MG tablet   Other Relevant Orders   CBC with Differential/Platelet   CMP14+EGFR   Lipid panel   Bayer DCA Hb A1c Waived     Other   Mixed hyperlipidemia   Relevant Medications    carvedilol (COREG) 12.5 MG tablet   rosuvastatin (CRESTOR) 20 MG tablet   Other Relevant Orders   CBC with Differential/Platelet   CMP14+EGFR   Lipid panel   Bayer DCA Hb A1c Waived    We will try for Dexcom because she does use insulin once a day and with her stroke history and vision issues, she does struggle a little bit with dexterity.  She would benefit a lot from a CGM. Follow up plan: Return in about 3 months (around 06/11/2022), or if symptoms worsen or fail to improve, for Diabetes hypertension cholesterol recheck.  Counseling provided for all of the vaccine components Orders Placed This Encounter  Procedures   CBC with Differential/Platelet   CMP14+EGFR   Lipid panel   Bayer DCA Hb A1c Waived    Caryl Pina, MD [facility] 03/11/2022, 5:04 PM

## 2022-03-12 ENCOUNTER — Telehealth: Payer: Self-pay

## 2022-03-12 LAB — CBC WITH DIFFERENTIAL/PLATELET
Basophils Absolute: 0.1 10*3/uL (ref 0.0–0.2)
Basos: 2 %
EOS (ABSOLUTE): 0.2 10*3/uL (ref 0.0–0.4)
Eos: 4 %
Hematocrit: 41.6 % (ref 34.0–46.6)
Hemoglobin: 13.4 g/dL (ref 11.1–15.9)
Immature Grans (Abs): 0 10*3/uL (ref 0.0–0.1)
Immature Granulocytes: 0 %
Lymphocytes Absolute: 1.4 10*3/uL (ref 0.7–3.1)
Lymphs: 27 %
MCH: 28.7 pg (ref 26.6–33.0)
MCHC: 32.2 g/dL (ref 31.5–35.7)
MCV: 89 fL (ref 79–97)
Monocytes Absolute: 0.7 10*3/uL (ref 0.1–0.9)
Monocytes: 13 %
Neutrophils Absolute: 2.9 10*3/uL (ref 1.4–7.0)
Neutrophils: 54 %
Platelets: 221 10*3/uL (ref 150–450)
RBC: 4.67 x10E6/uL (ref 3.77–5.28)
RDW: 13 % (ref 11.7–15.4)
WBC: 5.3 10*3/uL (ref 3.4–10.8)

## 2022-03-12 LAB — CMP14+EGFR
ALT: 9 IU/L (ref 0–32)
AST: 14 IU/L (ref 0–40)
Albumin/Globulin Ratio: 1.5 (ref 1.2–2.2)
Albumin: 3.9 g/dL (ref 3.8–4.8)
Alkaline Phosphatase: 125 IU/L — ABNORMAL HIGH (ref 44–121)
BUN/Creatinine Ratio: 19 (ref 12–28)
BUN: 18 mg/dL (ref 8–27)
Bilirubin Total: 0.4 mg/dL (ref 0.0–1.2)
CO2: 23 mmol/L (ref 20–29)
Calcium: 8.6 mg/dL — ABNORMAL LOW (ref 8.7–10.3)
Chloride: 106 mmol/L (ref 96–106)
Creatinine, Ser: 0.96 mg/dL (ref 0.57–1.00)
Globulin, Total: 2.6 g/dL (ref 1.5–4.5)
Glucose: 106 mg/dL — ABNORMAL HIGH (ref 70–99)
Potassium: 4.2 mmol/L (ref 3.5–5.2)
Sodium: 143 mmol/L (ref 134–144)
Total Protein: 6.5 g/dL (ref 6.0–8.5)
eGFR: 60 mL/min/{1.73_m2} (ref >59)

## 2022-03-12 LAB — LIPID PANEL
Chol/HDL Ratio: 3.2 ratio (ref 0.0–4.4)
Cholesterol, Total: 152 mg/dL (ref 100–199)
HDL: 47 mg/dL (ref >39)
LDL Chol Calc (NIH): 86 mg/dL (ref 0–99)
Triglycerides: 103 mg/dL (ref 0–149)
VLDL Cholesterol Cal: 19 mg/dL (ref 5–40)

## 2022-03-12 LAB — BAYER DCA HB A1C WAIVED: HB A1C (BAYER DCA - WAIVED): 6.6 % — ABNORMAL HIGH (ref 4.8–5.6)

## 2022-03-12 NOTE — Telephone Encounter (Signed)
(  Key: [id number]) Dexcom G6 transmitter  Your information has been submitted to [place]. To check for an updated outcome later, reopen this PA request from your dashboard.  If [facility] has not responded to your request within 24 hours, contact [place] at [phone]. If you think there may be a problem with your PA request, use our live chat feature at the bottom right.

## 2022-03-12 NOTE — Telephone Encounter (Signed)
(  Key: [id number]) Dexcom G6 Device  Your information has been submitted to [facility]. To check for an updated outcome later, reopen this PA request from your dashboard.  If [facility] has not responded to your request within 24 hours, contact [place] at [phone]. If you think there may be a problem with your PA request, use our live chat feature at the bottom right.

## 2022-03-12 NOTE — Telephone Encounter (Addendum)
(  Key: [id number]) Decom G6 Sensor  Your information has been submitted to [facility]. To check for an updated outcome later, reopen this PA request from your dashboard.  If [facility] has not responded to your request within 24 hours, contact [place] at [phone]. If you think there may be a problem with your PA request, use our live chat feature at the bottom right.

## 2022-03-16 NOTE — Telephone Encounter (Signed)
Unless she is struggling to meet goals, on insulin  or having documented episodes of hypoglycemia, no CGM will be covered

## 2022-03-16 NOTE — Telephone Encounter (Signed)
Dexcom G6 Device/sensor/reader have been denied.  Pt needs to be less than 79 years of age, have low blood sugar, or not meeting diabetic goals per [facility].  Will route message to Almyra Free to see if pt has other options.

## 2022-03-17 NOTE — Telephone Encounter (Signed)
Pt made aware. Asked to speak to husband but he was not available. Will call back

## 2022-03-22 ENCOUNTER — Other Ambulatory Visit: Payer: Self-pay | Admitting: Internal Medicine

## 2022-03-22 DIAGNOSIS — R058 Other specified cough: Secondary | ICD-10-CM

## 2022-03-30 ENCOUNTER — Ambulatory Visit: Payer: Medicare HMO | Admitting: Pharmacist

## 2022-03-30 ENCOUNTER — Telehealth: Payer: Self-pay | Admitting: Pharmacist

## 2022-03-30 DIAGNOSIS — E1142 Type 2 diabetes mellitus with diabetic polyneuropathy: Secondary | ICD-10-CM

## 2022-03-30 MED ORDER — TRULICITY 0.75 MG/0.5ML ~~LOC~~ SOAJ: 0.7500 mg | SUBCUTANEOUS | 5 refills | Status: DC

## 2022-03-30 NOTE — Telephone Encounter (Signed)
[facility] RE-ENROLL[facility]  Please mail patient Trulicity re-enrollment forms.  Patient is status post stroke and we speak with her Husband [name]) regarding her care.  He has documented permission to obviously speak on her behalf  Trulicity 0.'75mg'$  sq weekly Escribed to [place]  Denies personal and family history of Medullary thyroid cancer ([place])   Thank you!

## 2022-04-05 NOTE — Telephone Encounter (Signed)
Mailed app to pt home

## 2022-04-06 MED ORDER — TRULICITY 0.75 MG/0.5ML ~~LOC~~ SOAJ: 0.7500 mg | SUBCUTANEOUS | 5 refills | Status: DC

## 2022-04-06 MED ORDER — BASAGLAR KWIKPEN 100 UNIT/ML ~~LOC~~ SOPN: 10.0000 [IU] | PEN_INJECTOR | Freq: Every day | SUBCUTANEOUS | 5 refills | Status: DC

## 2022-04-06 NOTE — Progress Notes (Signed)
  Care Management   Follow Up Note   03/30/2022 Name: Susan Patel MRN: [id number] DOB: Dec 20, 1940   Referred by: Dettinger, Fransisca Kaufmann, MD Reason for referral : Chronic Care Management and Diabetes   Outreach to patient's husband [name]).  Patient is stable on current therapy.  We will re-enroll patient in the [facility] patient assistance foundation for [employer] and Trulicity.  Most recent A1c was 6.6 on 10.26.23.  Denies hypoglycemia.  Application to be mailed to patient.  Medications above were escribed to [facility]).     Regina Eck, PharmD, BCPS Clinical Pharmacist, [place]  [facility] Phone [phone]

## 2022-04-11 ENCOUNTER — Inpatient Hospital Stay (HOSPITAL_COMMUNITY)
Admission: EM | Admit: 2022-04-11 | Discharge: 2022-04-16 | DRG: 280 | Disposition: A | Discharge status: Hospice | Payer: Medicare Other | Attending: Internal Medicine | Admitting: Internal Medicine

## 2022-04-11 ENCOUNTER — Inpatient Hospital Stay (HOSPITAL_COMMUNITY): Payer: Medicare Other

## 2022-04-11 ENCOUNTER — Encounter (HOSPITAL_COMMUNITY): Payer: Self-pay | Admitting: Emergency Medicine

## 2022-04-11 ENCOUNTER — Encounter (HOSPITAL_COMMUNITY): Payer: Self-pay

## 2022-04-11 ENCOUNTER — Emergency Department (HOSPITAL_COMMUNITY): Payer: Medicare Other

## 2022-04-11 ENCOUNTER — Other Ambulatory Visit (HOSPITAL_COMMUNITY): Payer: Self-pay | Admitting: *Deleted

## 2022-04-11 DIAGNOSIS — J4489 Other specified chronic obstructive pulmonary disease: Secondary | ICD-10-CM | POA: Diagnosis present

## 2022-04-11 DIAGNOSIS — I110 Hypertensive heart disease with heart failure: Secondary | ICD-10-CM | POA: Diagnosis present

## 2022-04-11 DIAGNOSIS — Z515 Encounter for palliative care: Secondary | ICD-10-CM

## 2022-04-11 DIAGNOSIS — R682 Dry mouth, unspecified: Secondary | ICD-10-CM | POA: Diagnosis present

## 2022-04-11 DIAGNOSIS — Z9981 Dependence on supplemental oxygen: Secondary | ICD-10-CM

## 2022-04-11 DIAGNOSIS — E782 Mixed hyperlipidemia: Secondary | ICD-10-CM | POA: Diagnosis present

## 2022-04-11 DIAGNOSIS — Z79899 Other long term (current) drug therapy: Secondary | ICD-10-CM | POA: Diagnosis not present

## 2022-04-11 DIAGNOSIS — R778 Other specified abnormalities of plasma proteins: Secondary | ICD-10-CM | POA: Diagnosis not present

## 2022-04-11 DIAGNOSIS — I5021 Acute systolic (congestive) heart failure: Secondary | ICD-10-CM

## 2022-04-11 DIAGNOSIS — Z711 Person with feared health complaint in whom no diagnosis is made: Secondary | ICD-10-CM | POA: Diagnosis not present

## 2022-04-11 DIAGNOSIS — R627 Adult failure to thrive: Secondary | ICD-10-CM | POA: Diagnosis present

## 2022-04-11 DIAGNOSIS — Z7985 Long-term (current) use of injectable non-insulin antidiabetic drugs: Secondary | ICD-10-CM

## 2022-04-11 DIAGNOSIS — E1142 Type 2 diabetes mellitus with diabetic polyneuropathy: Secondary | ICD-10-CM | POA: Diagnosis not present

## 2022-04-11 DIAGNOSIS — I214 Non-ST elevation (NSTEMI) myocardial infarction: Secondary | ICD-10-CM | POA: Diagnosis not present

## 2022-04-11 DIAGNOSIS — R7309 Other abnormal glucose: Secondary | ICD-10-CM | POA: Diagnosis not present

## 2022-04-11 DIAGNOSIS — Z794 Long term (current) use of insulin: Secondary | ICD-10-CM

## 2022-04-11 DIAGNOSIS — J9621 Acute and chronic respiratory failure with hypoxia: Secondary | ICD-10-CM | POA: Diagnosis present

## 2022-04-11 DIAGNOSIS — R7989 Other specified abnormal findings of blood chemistry: Secondary | ICD-10-CM

## 2022-04-11 DIAGNOSIS — Z66 Do not resuscitate: Secondary | ICD-10-CM | POA: Diagnosis not present

## 2022-04-11 DIAGNOSIS — M797 Fibromyalgia: Secondary | ICD-10-CM | POA: Diagnosis present

## 2022-04-11 DIAGNOSIS — I69322 Dysarthria following cerebral infarction: Secondary | ICD-10-CM

## 2022-04-11 DIAGNOSIS — Z881 Allergy status to other antibiotic agents status: Secondary | ICD-10-CM

## 2022-04-11 DIAGNOSIS — R54 Age-related physical debility: Secondary | ICD-10-CM | POA: Diagnosis present

## 2022-04-11 DIAGNOSIS — I5033 Acute on chronic diastolic (congestive) heart failure: Principal | ICD-10-CM

## 2022-04-11 DIAGNOSIS — R1310 Dysphagia, unspecified: Secondary | ICD-10-CM | POA: Diagnosis not present

## 2022-04-11 DIAGNOSIS — I959 Hypotension, unspecified: Secondary | ICD-10-CM | POA: Diagnosis not present

## 2022-04-11 DIAGNOSIS — N179 Acute kidney failure, unspecified: Secondary | ICD-10-CM | POA: Diagnosis not present

## 2022-04-11 DIAGNOSIS — G459 Transient cerebral ischemic attack, unspecified: Secondary | ICD-10-CM | POA: Diagnosis not present

## 2022-04-11 DIAGNOSIS — I2510 Atherosclerotic heart disease of native coronary artery without angina pectoris: Secondary | ICD-10-CM | POA: Diagnosis not present

## 2022-04-11 DIAGNOSIS — R4702 Dysphasia: Secondary | ICD-10-CM | POA: Diagnosis not present

## 2022-04-11 DIAGNOSIS — I252 Old myocardial infarction: Secondary | ICD-10-CM | POA: Diagnosis not present

## 2022-04-11 DIAGNOSIS — I509 Heart failure, unspecified: Secondary | ICD-10-CM | POA: Diagnosis not present

## 2022-04-11 DIAGNOSIS — Z7189 Other specified counseling: Secondary | ICD-10-CM | POA: Diagnosis not present

## 2022-04-11 DIAGNOSIS — J449 Chronic obstructive pulmonary disease, unspecified: Secondary | ICD-10-CM | POA: Diagnosis present

## 2022-04-11 DIAGNOSIS — Z981 Arthrodesis status: Secondary | ICD-10-CM

## 2022-04-11 DIAGNOSIS — I447 Left bundle-branch block, unspecified: Secondary | ICD-10-CM | POA: Diagnosis present

## 2022-04-11 DIAGNOSIS — I69920 Aphasia following unspecified cerebrovascular disease: Secondary | ICD-10-CM | POA: Diagnosis not present

## 2022-04-11 DIAGNOSIS — Z91048 Other nonmedicinal substance allergy status: Secondary | ICD-10-CM

## 2022-04-11 DIAGNOSIS — G8929 Other chronic pain: Secondary | ICD-10-CM | POA: Diagnosis present

## 2022-04-11 DIAGNOSIS — Z882 Allergy status to sulfonamides status: Secondary | ICD-10-CM | POA: Diagnosis not present

## 2022-04-11 DIAGNOSIS — Z6833 Body mass index (BMI) 33.0-33.9, adult: Secondary | ICD-10-CM | POA: Diagnosis not present

## 2022-04-11 DIAGNOSIS — Z8249 Family history of ischemic heart disease and other diseases of the circulatory system: Secondary | ICD-10-CM | POA: Diagnosis not present

## 2022-04-11 DIAGNOSIS — K2100 Gastro-esophageal reflux disease with esophagitis, without bleeding: Secondary | ICD-10-CM | POA: Diagnosis present

## 2022-04-11 DIAGNOSIS — Z888 Allergy status to other drugs, medicaments and biological substances status: Secondary | ICD-10-CM

## 2022-04-11 DIAGNOSIS — I6930 Unspecified sequelae of cerebral infarction: Secondary | ICD-10-CM

## 2022-04-11 DIAGNOSIS — R079 Chest pain, unspecified: Secondary | ICD-10-CM | POA: Diagnosis not present

## 2022-04-11 DIAGNOSIS — Z789 Other specified health status: Secondary | ICD-10-CM | POA: Diagnosis not present

## 2022-04-11 DIAGNOSIS — E876 Hypokalemia: Secondary | ICD-10-CM | POA: Diagnosis present

## 2022-04-11 DIAGNOSIS — I10 Essential (primary) hypertension: Secondary | ICD-10-CM | POA: Diagnosis present

## 2022-04-11 LAB — MRSA NEXT GEN BY PCR, NASAL: MRSA by PCR Next Gen: NOT DETECTED

## 2022-04-11 LAB — CBC WITH DIFFERENTIAL/PLATELET
Abs Immature Granulocytes: 0.03 10*3/uL (ref 0.00–0.07)
Basophils Absolute: 0.1 10*3/uL (ref 0.0–0.1)
Basophils Relative: 1 %
Eosinophils Absolute: 0.2 10*3/uL (ref 0.0–0.5)
Eosinophils Relative: 2 %
HCT: 41.4 % (ref 36.0–46.0)
Hemoglobin: 13.7 g/dL (ref 12.0–15.0)
Immature Granulocytes: 0 %
Lymphocytes Relative: 21 %
Lymphs Abs: 1.9 10*3/uL (ref 0.7–4.0)
MCH: 29.2 pg (ref 26.0–34.0)
MCHC: 33.1 g/dL (ref 30.0–36.0)
MCV: 88.3 fL (ref 80.0–100.0)
Monocytes Absolute: 0.6 10*3/uL (ref 0.1–1.0)
Monocytes Relative: 7 %
Neutro Abs: 6 10*3/uL (ref 1.7–7.7)
Neutrophils Relative %: 69 %
Platelets: 222 10*3/uL (ref 150–400)
RBC: 4.69 MIL/uL (ref 3.87–5.11)
RDW: 13.9 % (ref 11.5–15.5)
WBC: 8.8 10*3/uL (ref 4.0–10.5)
nRBC: 0 % (ref 0.0–0.2)

## 2022-04-11 LAB — BASIC METABOLIC PANEL
Anion gap: 8 (ref 5–15)
BUN: 21 mg/dL (ref 8–23)
CO2: 24 mmol/L (ref 22–32)
Calcium: 8.5 mg/dL — ABNORMAL LOW (ref 8.9–10.3)
Chloride: 108 mmol/L (ref 98–111)
Creatinine, Ser: 0.8 mg/dL (ref 0.44–1.00)
GFR, Estimated: >60 mL/min (ref >60)
Glucose, Bld: 125 mg/dL — ABNORMAL HIGH (ref 70–99)
Potassium: 3.7 mmol/L (ref 3.5–5.1)
Sodium: 140 mmol/L (ref 135–145)

## 2022-04-11 LAB — ECHOCARDIOGRAM COMPLETE
Area-P 1/2: 6.17 cm2
Calc EF: 36.8 %
Height: 61 in
MV M vel: 4.03 m/s
MV Peak grad: 65 mmHg
S' Lateral: 4.8 cm
Single Plane A2C EF: 39.6 %
Single Plane A4C EF: 27.6 %
Weight: 3040.58 oz

## 2022-04-11 LAB — HEPARIN LEVEL (UNFRACTIONATED): Heparin Unfractionated: 0.56 IU/mL (ref 0.30–0.70)

## 2022-04-11 LAB — TROPONIN I (HIGH SENSITIVITY)
Troponin I (High Sensitivity): 350 ng/L (ref <18)
Troponin I (High Sensitivity): 1394 ng/L (ref <18)

## 2022-04-11 LAB — CBG MONITORING, ED
Glucose-Capillary: 113 mg/dL — ABNORMAL HIGH (ref 70–99)
Glucose-Capillary: 176 mg/dL — ABNORMAL HIGH (ref 70–99)

## 2022-04-11 LAB — GLUCOSE, CAPILLARY: Glucose-Capillary: 103 mg/dL — ABNORMAL HIGH (ref 70–99)

## 2022-04-11 LAB — BRAIN NATRIURETIC PEPTIDE: B Natriuretic Peptide: 563 pg/mL — ABNORMAL HIGH (ref 0.0–100.0)

## 2022-04-11 MED ORDER — FUROSEMIDE 10 MG/ML IJ SOLN
40.0000 mg | Freq: Once | INTRAMUSCULAR | Status: AC
  Administered 2022-04-11: 40 mg via INTRAVENOUS
  Filled 2022-04-11: qty 4

## 2022-04-11 MED ORDER — NITROGLYCERIN 2 % TD OINT
1.0000 [in_us] | TOPICAL_OINTMENT | Freq: Once | TRANSDERMAL | Status: AC
  Administered 2022-04-11: 1 [in_us] via TOPICAL
  Filled 2022-04-11: qty 1

## 2022-04-11 MED ORDER — ISOSORBIDE MONONITRATE ER 30 MG PO TB24
30.0000 mg | ORAL_TABLET | Freq: Every day | ORAL | Status: DC
  Administered 2022-04-11 – 2022-04-15 (×3): 30 mg via ORAL
  Filled 2022-04-11 (×4): qty 1

## 2022-04-11 MED ORDER — HEPARIN (PORCINE) 25000 UT/250ML-% IV SOLN
900.0000 [IU]/h | INTRAVENOUS | Status: DC
  Administered 2022-04-11 – 2022-04-12 (×2): 1000 [IU]/h via INTRAVENOUS
  Filled 2022-04-11 (×2): qty 250

## 2022-04-11 MED ORDER — HEPARIN BOLUS VIA INFUSION
3000.0000 [IU] | Freq: Once | INTRAVENOUS | Status: AC
  Administered 2022-04-11: 3000 [IU] via INTRAVENOUS

## 2022-04-11 MED ORDER — IPRATROPIUM-ALBUTEROL 0.5-2.5 (3) MG/3ML IN SOLN
3.0000 mL | Freq: Three times a day (TID) | RESPIRATORY_TRACT | Status: DC
  Administered 2022-04-12 – 2022-04-13 (×4): 3 mL via RESPIRATORY_TRACT
  Filled 2022-04-11 (×4): qty 3

## 2022-04-11 MED ORDER — ASPIRIN 81 MG PO TBEC
81.0000 mg | DELAYED_RELEASE_TABLET | Freq: Every day | ORAL | Status: DC
  Administered 2022-04-12: 81 mg via ORAL
  Filled 2022-04-11 (×2): qty 1

## 2022-04-11 MED ORDER — ASPIRIN 81 MG PO CHEW
324.0000 mg | CHEWABLE_TABLET | Freq: Once | ORAL | Status: AC
  Administered 2022-04-11: 324 mg via ORAL
  Filled 2022-04-11: qty 4

## 2022-04-11 MED ORDER — ONDANSETRON HCL 4 MG/2ML IJ SOLN: 4.0000 mg | Freq: Four times a day (QID) | INTRAMUSCULAR | Status: DC | PRN

## 2022-04-11 MED ORDER — ROSUVASTATIN CALCIUM 20 MG PO TABS
20.0000 mg | ORAL_TABLET | Freq: Every day | ORAL | Status: DC
  Administered 2022-04-11: 20 mg via ORAL
  Filled 2022-04-11: qty 1

## 2022-04-11 MED ORDER — TRAZODONE HCL 50 MG PO TABS
50.0000 mg | ORAL_TABLET | Freq: Every evening | ORAL | Status: DC | PRN
  Administered 2022-04-11 – 2022-04-13 (×2): 50 mg via ORAL
  Filled 2022-04-11 (×2): qty 1

## 2022-04-11 MED ORDER — INSULIN ASPART 100 UNIT/ML IJ SOLN: 0.0000 [IU] | Freq: Every day | INTRAMUSCULAR | Status: DC

## 2022-04-11 MED ORDER — CARVEDILOL 6.25 MG PO TABS
6.2500 mg | ORAL_TABLET | Freq: Two times a day (BID) | ORAL | Status: DC
  Administered 2022-04-11 – 2022-04-12 (×3): 6.25 mg via ORAL
  Filled 2022-04-11: qty 2
  Filled 2022-04-11 (×2): qty 1

## 2022-04-11 MED ORDER — MONTELUKAST SODIUM 10 MG PO TABS
10.0000 mg | ORAL_TABLET | Freq: Every day | ORAL | Status: DC
  Administered 2022-04-11 – 2022-04-15 (×3): 10 mg via ORAL
  Filled 2022-04-11 (×4): qty 1

## 2022-04-11 MED ORDER — ACETAMINOPHEN 325 MG PO TABS: 650.0000 mg | ORAL_TABLET | Freq: Four times a day (QID) | ORAL | Status: DC | PRN

## 2022-04-11 MED ORDER — IPRATROPIUM-ALBUTEROL 0.5-2.5 (3) MG/3ML IN SOLN
3.0000 mL | Freq: Four times a day (QID) | RESPIRATORY_TRACT | Status: DC
  Administered 2022-04-11 (×3): 3 mL via RESPIRATORY_TRACT
  Filled 2022-04-11 (×3): qty 3

## 2022-04-11 MED ORDER — PANTOPRAZOLE SODIUM 40 MG PO TBEC: 40.0000 mg | DELAYED_RELEASE_TABLET | Freq: Every day | ORAL | Status: DC

## 2022-04-11 MED ORDER — ACETAMINOPHEN 650 MG RE SUPP: 650.0000 mg | Freq: Four times a day (QID) | RECTAL | Status: DC | PRN

## 2022-04-11 MED ORDER — INSULIN ASPART 100 UNIT/ML IJ SOLN
0.0000 [IU] | Freq: Three times a day (TID) | INTRAMUSCULAR | Status: DC
  Administered 2022-04-14: 2 [IU] via SUBCUTANEOUS
  Administered 2022-04-15: 1 [IU] via SUBCUTANEOUS
  Administered 2022-04-15: 2 [IU] via SUBCUTANEOUS

## 2022-04-11 MED ORDER — NITROGLYCERIN 0.4 MG SL SUBL: 0.4000 mg | SUBLINGUAL_TABLET | SUBLINGUAL | Status: DC | PRN

## 2022-04-11 MED ORDER — FUROSEMIDE 10 MG/ML IJ SOLN: 40.0000 mg | Freq: Two times a day (BID) | INTRAMUSCULAR | Status: DC

## 2022-04-11 MED ORDER — ONDANSETRON HCL 4 MG PO TABS: 4.0000 mg | ORAL_TABLET | Freq: Four times a day (QID) | ORAL | Status: DC | PRN

## 2022-04-11 MED ORDER — ALBUTEROL SULFATE (2.5 MG/3ML) 0.083% IN NEBU: 2.5000 mg | INHALATION_SOLUTION | RESPIRATORY_TRACT | Status: DC | PRN

## 2022-04-11 MED ORDER — FUROSEMIDE 10 MG/ML IJ SOLN
40.0000 mg | Freq: Two times a day (BID) | INTRAMUSCULAR | Status: DC
  Administered 2022-04-11: 40 mg via INTRAVENOUS
  Filled 2022-04-11: qty 4

## 2022-04-11 NOTE — ED Notes (Signed)
Date and time results received: 04/11/22 7:11 AM  (use smartphrase ".now" to insert current time)  Test: Troponin Critical Value: 1394  Name of Provider Notified: Dr. Roxanne Mins  Orders Received? Or Actions Taken?: Actions Taken: New heparin order

## 2022-04-11 NOTE — Progress Notes (Signed)
ANTICOAGULATION CONSULT NOTE - Brief Note:  Pharmacy Consult for heparin Indication: chest pain/ACS  Labs: Recent Labs    04/11/22 0245 04/11/22 0558 04/11/22 1519  HGB 13.7  --   --   HCT 41.4  --   --   PLT 222  --   --   HEPARINUNFRC  --   --  0.56  CREATININE 0.80  --   --   TROPONINIHS 350* 1,394*  --    Estimated Creatinine Clearance: 56.9 mL/min (by C-G formula based on SCr of 0.8 mg/dL).  Assessment: 81yo female on heparin for NSTEMI, heparin level therapeutic. No adverse events noted  Goal of Therapy:  Heparin level 0.3-0.7 units/ml Monitor platelets by anticoagulation protocol: Yes   Plan:  Continue heparin gtt at 1000 units/hr F/u daily labs  Lorenso Courier, PharmD Clinical Pharmacist 04/11/2022 4:36 PM

## 2022-04-11 NOTE — Progress Notes (Signed)
NTS patient to try and get some of the congestion and mucus out of her airway and throat. RT got a small amount of white/clear thick. Passed down both nares then deep suctioned with yaunker. Patient tolerated well but still sounds very congested and rhonchus. Currently on NRB satting 99%.
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
## 2022-04-11 NOTE — ED Notes (Signed)
[facility] called at this time to setup transport.

## 2022-04-11 NOTE — H&P (Signed)
History and Physical    Susan Patel DOB: 23-Feb-1941 DOA: 04/11/2022  PCP: Dettinger, Fransisca Kaufmann, MD  Patient coming from: Home  I have personally briefly reviewed patient's old medical records in [place]  Chief Complaint: Chest pain and shortness of breath  HPI: Susan Patel is a 81 y.o. female with medical history significant of coronary artery disease diabetes, hypertension, previous stroke with residual dysarthria, COPD on oxygen at night, presents to the hospital with complaints of chest pain and shortness of breath.  Patient unable to provide detailed history due to significant dysarthria.  She does answer yes and no to some questions.  53 of information is obtained from patient's husband and sister.  According to their report, patient was in her usual state of health yesterday.  Last night while she was going to bed, she developed significant chest pain that radiated to her left arm.  She had associated shortness of breath.  Symptoms appear to be quite sudden and progressive.  She has not had any recent fever or change in her chronic cough.  She has not had any vomiting or diarrhea.  EMS was called and she was found to be in respiratory distress on their arrival.  She oxygen saturations were 88% on room air.  She was placed on nonrebreather.  ED Course: On arrival to the emergency room, she was noted to have elevated BNP as well as troponin.  Chest x-ray showed mild CHF.  EKG showed sinus rhythm with left bundle branch block.  She received aspirin, nitroglycerin, Lasix.  Overall respiratory status has began to improve.  Troponin has trended up.  Case was reviewed with cardiology at [facility] who recommended transfer to [facility] for further evaluation.  Review of Systems: As per HPI otherwise 10 point review of systems negative.    Past Medical History:  Diagnosis Date   Arthritis    'all over"   Asthma    CAD (coronary artery disease)    Multivessel  CAD, not a candidate for CABG, managing medically.   Chronic pain    COPD (chronic obstructive pulmonary disease) (HCC)    O2 per [place] at nights    Depression    Diabetes mellitus without complication (HCC)    borderline- , states she was on med., but MD told her "everything is under control so I threw the bottle away"   Fibromyalgia    GERD (gastroesophageal reflux disease)    Hypertension    Neuromuscular disorder ([place])    parkinson, neuropathy- both feet & hands.   Stroke [facility])    residual dysarthria    Past Surgical History:  Procedure Laterality Date   ABDOMINAL HYSTERECTOMY     ANTERIOR CERVICAL DECOMP/DISCECTOMY FUSION N/A 07/10/2015   Procedure: Cervical five-six, Cervical six-seven anterior cervical decompression with fusion plating and bonegraft;  Surgeon: Jovita Gamma, MD;  Location: [place] [facility];  Service: Neurosurgery;  Laterality: N/A;   APPENDECTOMY     BIOPSY EYE MUSCLE  03/20/2016   biopsy vessel to right eye due to swelling   EYE SURGERY Bilateral    cataracts removed, /w "cyrstal lenses"    LEFT HEART CATH AND CORONARY ANGIOGRAPHY N/A 05/23/2018   Procedure: LEFT HEART CATH AND CORONARY ANGIOGRAPHY;  Surgeon: Troy Sine, MD;  Location: [place] CV LAB;  Service: Cardiovascular;  Laterality: N/A;   SHOULDER ARTHROSCOPY Right    x2   RCR- spurs removed     Social History:  reports that  she has never smoked. She has never used smokeless tobacco. She reports that she does not drink alcohol and does not use drugs.  Allergies  Allergen Reactions   Azithromycin Other (See Comments)    Renal failure   Prednisone Other (See Comments)    Irritability and insomnia   Sulfamethoxazole-Trimethoprim Other (See Comments)    Renal failure   Amlodipine Swelling    Caused swelling in legs   Ciprofloxacin Itching and Rash   Tape Rash    Family History  Problem Relation Age of Onset   Heart disease Mother    Cancer Father    Heart disease Father    Arthritis  Sister    Heart failure Sister    Cancer Brother    Thyroid cancer Son    Atrial fibrillation Son    Arthritis Sister      Prior to Admission medications   Medication Sig Start Date End Date Taking? Authorizing Provider  acetaminophen (TYLENOL) 325 MG tablet Take 2 tablets (650 mg total) by mouth every 6 (six) hours as needed. 02/23/18   Varney Biles, MD  albuterol (VENTOLIN HFA) 108 (90 Base) MCG/ACT inhaler INHALE 1-2 PUFFS BY MOUTH EVERY 6 HOURS AS NEEDED FOR WHEEZE OR SHORTNESS OF BREATH 09/01/20   Dettinger, Fransisca Kaufmann, MD  B-D UF III MINI PEN NEEDLES 31G X 5 MM MISC USE DAILY AS DIRECTED 11/03/21   Dettinger, Fransisca Kaufmann, MD  carvedilol (COREG) 12.5 MG tablet Take 1 tablet (12.5 mg total) by mouth 2 (two) times daily. 03/11/22   Dettinger, Fransisca Kaufmann, MD  Continuous Blood Gluc Receiver ([place]) [place] 1 each by Does not apply route 4 (four) times daily. 03/11/22   Dettinger, Fransisca Kaufmann, MD  Continuous Blood Gluc Sensor (DEXCOM G6 SENSOR) MISC 1 each by Does not apply route once a week. 03/11/22   Dettinger, Fransisca Kaufmann, MD  Continuous Blood Gluc Transmit (DEXCOM G6 TRANSMITTER) MISC 1 each by Does not apply route 4 (four) times daily. 03/11/22   Dettinger, Fransisca Kaufmann, MD  [facility] ASPIRIN LOW STRENGTH 81 MG tablet TAKE 1 TABLET BY MOUTH EVERY DAY 11/03/21   Dettinger, Fransisca Kaufmann, MD  Dulaglutide (TRULICITY) 6.28 ZM/6.2HU SOPN Inject 0.75 mg into the skin once a week. 04/06/22   Dettinger, Fransisca Kaufmann, MD  famotidine (PEPCID) 20 MG tablet TAKE 1 TABLET BY MOUTH EVERY DAY AFTER SUPPER 04/29/21   Tanda Rockers, MD  fluticasone [facility]) 50 MCG/ACT nasal spray Place 1 spray into both nostrils 2 (two) times daily as needed for allergies or rhinitis. 12/17/19   Dettinger, Fransisca Kaufmann, MD  glucose blood test strip Use as instructed 07/04/17   Eustaquio Maize, MD  Insulin Glargine [facility]) 100 UNIT/ML Inject 10 Units into the skin daily. 04/06/22   Dettinger, Fransisca Kaufmann, MD  isosorbide mononitrate  (IMDUR) 30 MG 24 hr tablet TAKE 1 TABLET BY MOUTH EVERY DAY 12/09/21   Ahmed Prima, Fransisco Hertz, PA-C  linaclotide [facility]) 290 MCG CAPS capsule Take 1 capsule (290 mcg total) by mouth daily before breakfast. 06/02/20   Dettinger, Fransisca Kaufmann, MD  methocarbamol (ROBAXIN) 750 MG tablet Take 1 tablet (750 mg total) by mouth at bedtime as needed. 06/02/20   Dettinger, Fransisca Kaufmann, MD  montelukast (SINGULAIR) 10 MG tablet Take 1 tablet (10 mg total) by mouth daily. 03/11/22   Dettinger, Fransisca Kaufmann, MD  nitroGLYCERIN (NITROSTAT) 0.4 MG SL tablet Place 1 tablet (0.4 mg total) under the tongue every 5 (five) minutes as needed.  08/08/20   Verta Ellen., NP  [facility] DELICA LANCETS FINE MISC TEST TWICE DAILY 12/26/17   Eustaquio Maize, MD  OXYGEN Inhale 3-4 L into the lungs at bedtime.    [provider]  pantoprazole (PROTONIX) 40 MG tablet TAKE 1 TABLET (40 MG TOTAL) BY MOUTH DAILY. TAKE 30-60 MIN BEFORE FIRST MEAL OF THE DAY 04/29/21   Tanda Rockers, MD  rosuvastatin (CRESTOR) 20 MG tablet Take 1 tablet (20 mg total) by mouth daily. 03/11/22   Dettinger, Fransisca Kaufmann, MD  traZODone (DESYREL) 50 MG tablet TAKE 1 TABLET BY MOUTH AT BEDTIME AS NEEDED FOR SLEEP. 07/01/21   Dettinger, Fransisca Kaufmann, MD  vitamin B-12 (CYANOCOBALAMIN) 1000 MCG tablet Take 1,000 mcg by mouth daily.    [provider]    Physical Exam: Vitals:   04/11/22 0900 04/11/22 0918 04/11/22 0930 04/11/22 1000  BP: (!) 145/104  (!) 157/93 (!) 159/85  Pulse: 79  80 77  Resp: 16  (!) 22 (!) 22  Temp:      TempSrc:      SpO2: 97% 99% 95% 97%  Weight:      Height:        Constitutional: NAD, calm, comfortable Eyes: PERRL, lids and conjunctivae normal ENMT: Mucous membranes are moist. Posterior pharynx clear of any exudate or lesions.Normal dentition.  Neck: normal, supple, no masses, no thyromegaly Respiratory: Mild crackles at bases bilaterally. Normal respiratory effort. No accessory muscle use.  Cardiovascular: Regular rate and  rhythm, no murmurs / rubs / gallops. 2+ pedal pulses. No carotid bruits.  Abdomen: no tenderness, no masses palpated. No hepatosplenomegaly. Bowel sounds positive.  Musculoskeletal: no clubbing / cyanosis. No joint deformity upper and lower extremities. Good ROM, no contractures. Normal muscle tone.  Skin: no rashes, lesions, ulcers. No induration Neurologic: Patient is significantly dysarthric.  Appears to be moving all 4 extremities spontaneously Psychiatric: Unable to assess due to significant dysarthria   Labs on Admission: I have personally reviewed following labs and imaging studies  CBC: Recent Labs  Lab 04/11/22 0245  WBC 8.8  NEUTROABS 6.0  HGB 13.7  HCT 41.4  MCV 88.3  PLT 428   Basic Metabolic Panel: Recent Labs  Lab 04/11/22 0245  NA 140  K 3.7  CL 108  CO2 24  GLUCOSE 125*  BUN 21  CREATININE 0.80  CALCIUM 8.5*   GFR: Estimated Creatinine Clearance: 56.9 mL/min (by C-G formula based on SCr of 0.8 mg/dL). Liver Function Tests: No results for input(s): "AST", "ALT", "ALKPHOS", "BILITOT", "PROT", "ALBUMIN" in the last 168 hours. No results for input(s): "LIPASE", "AMYLASE" in the last 168 hours. No results for input(s): "AMMONIA" in the last 168 hours. Coagulation Profile: No results for input(s): "INR", "PROTIME" in the last 168 hours. Cardiac Enzymes: No results for input(s): "CKTOTAL", "CKMB", "CKMBINDEX", "TROPONINI" in the last 168 hours. BNP (last 3 results) No results for input(s): "PROBNP" in the last 8760 hours. HbA1C: No results for input(s): "HGBA1C" in the last 72 hours. CBG: No results for input(s): "GLUCAP" in the last 168 hours. Lipid Profile: No results for input(s): "CHOL", "HDL", "LDLCALC", "TRIG", "CHOLHDL", "LDLDIRECT" in the last 72 hours. Thyroid Function Tests: No results for input(s): "TSH", "T4TOTAL", "FREET4", "T3FREE", "THYROIDAB" in the last 72 hours. Anemia Panel: No results for input(s): "VITAMINB12", "FOLATE", "FERRITIN",  "TIBC", "IRON", "RETICCTPCT" in the last 72 hours. Urine analysis:    Component Value Date/Time   COLORURINE YELLOW 11/22/2019 [place] 11/22/2019  [place] 1.010 11/22/2019 1727   PHURINE 5.0 11/22/2019 [place] 11/22/2019 1727   HGBUR NEGATIVE 11/22/2019 1727   BILIRUBINUR NEGATIVE 11/22/2019 1727   [place] 11/22/2019 1727   PROTEINUR NEGATIVE 11/22/2019 1727   NITRITE NEGATIVE 11/22/2019 1727   LEUKOCYTESUR MODERATE (A) 11/22/2019 1727    Radiological Exams on Admission: DG Chest Port 1 View  Result Date: 04/11/2022 CLINICAL DATA:  Shortness of breath and chest pain EXAM: PORTABLE CHEST 1 VIEW COMPARISON:  05/13/2020 FINDINGS: Cardiac shadow is stable. Vascular congestion is noted centrally with mild edema consistent with CHF. No focal confluent infiltrate is seen. No effusion is noted. No acute bony abnormality is seen. IMPRESSION: Mild CHF. Electronically Signed   By: Inez Catalina M.D.   On: 04/11/2022 03:21    EKG: Independently reviewed.  Sinus rhythm with left bundle branch block.  Appears that LBBB was likely present on previous tracings  Assessment/Plan Principal Problem:   NSTEMI (non-ST elevated myocardial infarction) ([place]) Active Problems:   Essential hypertension   Gastro-esophageal reflux disease with esophagitis   Late effects of cerebral ischemic stroke   Type 2 diabetes mellitus with peripheral neuropathy (HCC)   Fibromyalgia   Chronic obstructive pulmonary disease (HCC)   Dysarthria, post-stroke   Mixed hyperlipidemia   Coronary artery disease     Chest pain, concern for anginal pain NSTEMI -EKG shows left bundle branch block, which appears to be present in prior tracings -Initial troponin 350 with follow-up 1394 -She does have a history of coronary artery disease -She received aspirin -She has been started on IV heparin -She is continued on statin -Will continue on beta-blocker -She received  Nitropaste, will resume Imdur -Echocardiogram has been ordered -Cardiology consulted   Acute CHF -Echocardiogram ordered to assess EF considering that she has had an elevated troponin -Continue on IV Lasix -Continue beta-blocker -Daily weights, intake and output  Acute respiratory failure with hypoxia -Reportedly had oxygen saturation of 88% on room air -Initially required nonrebreather, currently weaned down to oxygen of 4 L -Continue to wean down as tolerated  Diabetes -Holding home regimen -Hold basal insulin until ability to take p.o. intake has been assessed -Started on sliding scale insulin  Hypertension -Continued on Coreg -She will hopefully improve with continued diuresis  COPD -Does not appear that she is on any steroid inhalers at home -Husband reports that she has never been a smoker -Continue on bronchodilators and pulmonary hygiene for now  Hyperlipidemia -Continue statin  GERD -Continue on PPI  Previous stroke With residual dysarthria Residual dysphagia -Husband reports that she normally eats regular food and thin liquids -She does normally take her pills with applesauce -At baseline, she does ambulate with a walker  DVT prophylaxis: Heparin infusion Code Status: DNR, confirmed with husband Family Communication: discussed with husband and sister at the bedside  Disposition Plan: transfer to [facility] for further evaluation  Consults called: cardiology  Admission status: inpatient, tele   Kathie Dike MD [facility]   If 7PM-7AM, please contact night-coverage [email or web address]   04/11/2022, 10:37 AM

## 2022-04-11 NOTE — Plan of Care (Signed)
  Problem: Fluid Volume: Goal: Ability to maintain a balanced intake and output will improve Outcome: Progressing   Problem: Metabolic: Goal: Ability to maintain appropriate glucose levels will improve Outcome: Progressing   Problem: Nutritional: Goal: Maintenance of adequate nutrition will improve Outcome: Progressing   Problem: Tissue Perfusion: Goal: Adequacy of tissue perfusion will improve Outcome: Progressing

## 2022-04-11 NOTE — Progress Notes (Signed)
Patient presented in respiratory distress. Patient has elevated troponin. LVH on ekg. Grade I diastolic dysfunction. Patient does have a history of stroke with residual expressive aphasia - so difficult to get history. Dr. Radford Pax consulted and recommends admission to [facility], but not emergent ED to ED transfer.

## 2022-04-11 NOTE — Progress Notes (Signed)
ANTICOAGULATION CONSULT NOTE - Initial Consult  Pharmacy Consult for heparin Indication: chest pain/ACS  Allergies  Allergen Reactions   Azithromycin Other (See Comments)    Renal failure   Prednisone Other (See Comments)    Irritability and insomnia   Sulfamethoxazole-Trimethoprim Other (See Comments)    Renal failure   Amlodipine Swelling    Caused swelling in legs   Ciprofloxacin Itching and Rash   Tape Rash    Patient Measurements: Height: '5\' 1"'$  (154.9 cm) Weight: 86.2 kg (190 lb 0.6 oz) IBW/kg (Calculated) : 47.8 Heparin Dosing Weight: 70kg  Vital Signs: Temp: 97.9 F (36.6 C) (11/26 0245) Temp Source: Oral (11/26 0245) BP: 150/104 (11/26 0700) Pulse Rate: 87 (11/26 0700)  Labs: Recent Labs    04/11/22 0245 04/11/22 0558  HGB 13.7  --   HCT 41.4  --   PLT 222  --   CREATININE 0.80  --   TROPONINIHS 350* 1,394*    Estimated Creatinine Clearance: 56.9 mL/min (by C-G formula based on SCr of 0.8 mg/dL).   Medical History: Past Medical History:  Diagnosis Date   Arthritis    'all over"   Asthma    CAD (coronary artery disease)    Multivessel CAD, not a candidate for CABG, managing medically.   Chronic pain    COPD (chronic obstructive pulmonary disease) (HCC)    O2 per [place] at nights    Depression    Diabetes mellitus without complication (HCC)    borderline- , states she was on med., but MD told her "everything is under control so I threw the bottle away"   Fibromyalgia    GERD (gastroesophageal reflux disease)    Hypertension    Neuromuscular disorder ([place])    parkinson, neuropathy- both feet & hands.   Stroke [facility])    residual dysarthria    Assessment: 81yo female c/o CP, found by EMS to be in respiratory distress, initial troponin elevated and now increasing ([id number]) >> to begin heparin with plan to tx to [facility] for further w/u.  Goal of Therapy:  Heparin level 0.3-0.7 units/ml Monitor platelets by anticoagulation protocol: Yes   Plan:   Heparin 3000 units IV bolus x1 followed by infusion at 1000 units/hr. Monitor heparin levels and CBC.  Wynona Neat, PharmD, BCPS  04/11/2022,7:15 AM

## 2022-04-11 NOTE — ED Notes (Signed)
Pt placed on [place] at 6 lpm and O2 sats remained >94%.

## 2022-04-11 NOTE — Consult Note (Signed)
Cardiology Consultation:   Patient ID: Susan Patel MRN: [id number]; DOB: 1940/09/22  Admit date: 04/11/2022 Date of Consult: 04/11/2022  Primary Care Provider: Dettinger, Fransisca Kaufmann, MD Primary Cardiologist: Kate Sable, MD (Inactive)  Primary Electrophysiologist:  None    Patient Profile:   Susan Patel is a 81 y.o. female with a hx of HTN, GERD, prior CVA, T2DM, COPD, HLD, CAD who is being seen today for the evaluation of chest pain and NSTEMI at the request of hospitalist team.  History of Present Illness:   Ms. Duffett is a 81 y.o. female with a hx of HTN, GERD, prior CVA, T2DM, COPD, HLD, CAD who is being seen today for the evaluation of chest pain and NSTEMI. Patient has significant dysarthria from prior stroke so history was obtained from [place] and chart review. Noted to be in usual health until yesterday when she started to develop chest pain radiating to her left arm with SOB. Brought in to [facility] by EMS where she was noted to be hypoxemic requiring nonrebreather.   On arrival she had elevated BNP to 563 and trop 350 -> 1394. She was given aspirin, nitro, and lasix. Respiratory status improved after diuresis.   Past Medical History:  Diagnosis Date   Arthritis    'all over"   Asthma    CAD (coronary artery disease)    Multivessel CAD, not a candidate for CABG, managing medically.   Chronic pain    COPD (chronic obstructive pulmonary disease) (HCC)    O2 per [place] at nights    Depression    Diabetes mellitus without complication (HCC)    borderline- , states she was on med., but MD told her "everything is under control so I threw the bottle away"   Fibromyalgia    GERD (gastroesophageal reflux disease)    Hypertension    Neuromuscular disorder ([place])    parkinson, neuropathy- both feet & hands.   Stroke [facility])    residual dysarthria    Past Surgical History:  Procedure Laterality Date   ABDOMINAL HYSTERECTOMY     ANTERIOR CERVICAL DECOMP/DISCECTOMY  FUSION N/A 07/10/2015   Procedure: Cervical five-six, Cervical six-seven anterior cervical decompression with fusion plating and bonegraft;  Surgeon: Jovita Gamma, MD;  Location: [facility];  Service: Neurosurgery;  Laterality: N/A;   APPENDECTOMY     BIOPSY EYE MUSCLE  03/20/2016   biopsy vessel to right eye due to swelling   EYE SURGERY Bilateral    cataracts removed, /w "cyrstal lenses"    LEFT HEART CATH AND CORONARY ANGIOGRAPHY N/A 05/23/2018   Procedure: LEFT HEART CATH AND CORONARY ANGIOGRAPHY;  Surgeon: Troy Sine, MD;  Location: [facility];  Service: Cardiovascular;  Laterality: N/A;   SHOULDER ARTHROSCOPY Right    x2   RCR- spurs removed        Inpatient Medications: Scheduled Meds:  [START ON 04/12/2022] aspirin EC  81 mg Oral Daily   carvedilol  6.25 mg Oral BID   furosemide  40 mg Intravenous BID   insulin aspart  0-5 Units Subcutaneous QHS   insulin aspart  0-9 Units Subcutaneous TID WC   ipratropium-albuterol  3 mL Nebulization Q6H   isosorbide mononitrate  30 mg Oral Daily   montelukast  10 mg Oral Daily   [START ON 04/12/2022] pantoprazole  40 mg Oral QAC breakfast   rosuvastatin  20 mg Oral Daily   Continuous Infusions:  heparin 1,000 Units/hr (04/11/22 0726)   PRN Meds: acetaminophen **OR**  acetaminophen, albuterol, nitroGLYCERIN, ondansetron **OR** ondansetron (ZOFRAN) IV, traZODone  Allergies:    Allergies  Allergen Reactions   Bactrim [Sulfamethoxazole-Trimethoprim] Other (See Comments)    Renal failure   Prednisone Other (See Comments)    Irritability Insomnia    Z-Pak [Azithromycin] Other (See Comments)    Renal failure   Norvasc [Amlodipine] Swelling    Leg swelling   Cipro [Ciprofloxacin Hcl] Itching and Rash   Tape Rash    Social History:   Social History   Socioeconomic History   Marital status: Married    Spouse name: wayne   Number of children: 2   Years of education: 16   Highest education level: Bachelor's degree  (e.g., BA, AB, BS)  Occupational History   Occupation: retired    Comment: Automotive engineer  Tobacco Use   Smoking status: Never   Smokeless tobacco: Never  Vaping Use   Vaping Use: Never used  Substance and Sexual Activity   Alcohol use: No   Drug use: No   Sexual activity: Yes  Other Topics Concern   Not on file  Social History Narrative   Lives home with her husband   Handicap accessible bathroom   She has had several strokes - left her with impaired balance, memory, speech   Her husband takes care of cooking, cleaning, driving, etc   Social Determinants of Health   Financial Resource Strain: Low Risk  (04/27/2021)   Overall Financial Resource Strain (CARDIA)    Difficulty of Paying Living Expenses: Not hard at all  Food Insecurity: No Food Insecurity (04/27/2021)   Hunger Vital Sign    Worried About Running Out of Food in the Last Year: Never true    [place] in the Last Year: Never true  Transportation Needs: No Transportation Needs (04/27/2021)   PRAPARE - Hydrologist (Medical): No    Lack of Transportation (Non-Medical): No  Physical Activity: Inactive (04/27/2021)   Exercise Vital Sign    Days of Exercise per Week: 0 days    Minutes of Exercise per Session: 0 min  Stress: No Stress Concern Present (04/27/2021)   [place]    Feeling of Stress : Only a little  Social Connections: Socially Integrated (04/27/2021)   Social Connection and Isolation Panel [NHANES]    Frequency of Communication with Friends and Family: Twice a week    Frequency of Social Gatherings with Friends and Family: Twice a week    Attends Religious Services: More than 4 times per year    Active Member of [employer] or Organizations: Yes    Attends Music therapist: More than 4 times per year    Marital Status: Married  Human resources officer Violence: Not At Risk (04/27/2021)    Humiliation, Afraid, Rape, and Kick questionnaire    Fear of Current or Ex-Partner: No    Emotionally Abused: No    Physically Abused: No    Sexually Abused: No    Family History:    Family History  Problem Relation Age of Onset   Heart disease Mother    Cancer Father    Heart disease Father    Arthritis Sister    Heart failure Sister    Cancer Brother    Thyroid cancer Son    Atrial fibrillation Son    Arthritis Sister      Review of Systems: 12 point review of systems negative unless noted in  the HPI   Physical Exam/Data:   Vitals:   04/11/22 1700 04/11/22 1715 04/11/22 1717 04/11/22 1841  BP: 135/60   133/76  Pulse: 83 86    Resp: (!) 25 (!) 23  (!) 21  Temp:   98.8 F (37.1 C) 97.8 F (36.6 C)  TempSrc:   Axillary Oral  SpO2: 92% 94%    Weight:      Height:        Intake/Output Summary (Last 24 hours) at 04/11/2022 1928 Last data filed at 04/11/2022 1309 Gross per 24 hour  Intake --  Output 750 ml  Net -750 ml   Filed Weights   04/11/22 0247  Weight: 86.2 kg   Body mass index is 35.91 kg/m.  General:  no distress. Unable to respond due to dysarthria  HEENT: normal Lymph: no adenopathy Neck: no JVD Endocrine:  No thryomegaly Vascular: No carotid bruits; FA pulses 2+ bilaterally without bruits  Cardiac:  normal S1, S2; RRR; no murmur  Lungs:  coarse breath sounds throughout  Abd: soft, nontender, no hepatomegaly  Ext: no edema Musculoskeletal:  No deformities, BUE and BLE strength normal and equal Skin: warm and dry  Neuro:  CNs 2-12 intact, no focal abnormalities noted Psych:  Normal affect   EKG:  The EKG was personally reviewed and demonstrates:  nsr with LBBB Telemetry:  Telemetry was personally reviewed and demonstrates:  nsr  Relevant CV Studies: none  Laboratory Data:  Chemistry Recent Labs  Lab 04/11/22 0245  NA 140  K 3.7  CL 108  CO2 24  GLUCOSE 125*  BUN 21  CREATININE 0.80  CALCIUM 8.5*  GFRNONAA >60  ANIONGAP 8     No results for input(s): "PROT", "ALBUMIN", "AST", "ALT", "ALKPHOS", "BILITOT" in the last 168 hours. Hematology Recent Labs  Lab 04/11/22 0245  WBC 8.8  RBC 4.69  HGB 13.7  HCT 41.4  MCV 88.3  MCH 29.2  MCHC 33.1  RDW 13.9  PLT 222   Cardiac EnzymesNo results for input(s): "TROPONINI" in the last 168 hours. No results for input(s): "TROPIPOC" in the last 168 hours.  BNP Recent Labs  Lab 04/11/22 0245  BNP 563.0*    DDimer No results for input(s): "DDIMER" in the last 168 hours.  Radiology/Studies:  ECHOCARDIOGRAM COMPLETE  Result Date: 04/11/2022    ECHOCARDIOGRAM REPORT   Patient Name:   LAKASHIA COLLISON Date of Exam: 04/11/2022 Medical Rec #:  [id number]      Height:       61.0 in Accession #:    [id number]     Weight:       190.0 lb Date of Birth:  1940/11/15      BSA:          1.848 m Patient Age:    18 years       BP:           159/85 mmHg Patient Gender: F              HR:           80 bpm. Exam Location:  [facility] Procedure: 2D Echo, Cardiac Doppler and Color Doppler Indications:    Elevated Troponin  History:        Patient has prior history of Echocardiogram examinations, most                 recent 09/14/2021. CHF, Stroke and COPD; Risk  Factors:Hypertension, Diabetes and Dyslipidemia.  Sonographer:    Alvino Chapel RCS Referring Phys: [place]  1. Left ventricular ejection fraction, by estimation, is 25 to 30%. The left ventricle has severely decreased function. The left ventricle demonstrates regional wall motion abnormalities (see scoring diagram/findings for description). The left ventricular internal cavity size was moderately dilated. There is moderate left ventricular hypertrophy. Left ventricular diastolic parameters are consistent with Grade I diastolic dysfunction (impaired relaxation). There is akinesis of the left ventricular, entire inferolateral wall. There is mild dyskinesis of the left ventricular, entire apical segment.  There is severe hypokinesis of the left ventricular, inferior wall.  2. Right ventricular systolic function is normal. The right ventricular size is normal. Tricuspid regurgitation signal is inadequate for assessing PA pressure.  3. Left atrial size was mild to moderately dilated.  4. The mitral valve is normal in structure. Trivial mitral valve regurgitation.  5. The aortic valve is tricuspid. Aortic valve regurgitation is trivial. Aortic valve sclerosis is present, with no evidence of aortic valve stenosis. Comparison(s): The left ventricular function is significantly worse. The left ventricular wall motion abnormalities are new. FINDINGS  Left Ventricle: No LV apical thrombus is seen (Definity contrast was not used). Left ventricular ejection fraction, by estimation, is 25 to 30%. The left ventricle has severely decreased function. The left ventricle demonstrates regional wall motion abnormalities. Mild dyskinesis of the left ventricular, entire apical segment. The left ventricular internal cavity size was moderately dilated. There is moderate left ventricular hypertrophy. Left ventricular diastolic parameters are consistent with Grade I diastolic dysfunction (impaired relaxation). Normal left ventricular filling pressure.  LV Wall Scoring: The entire apex is dyskinetic. The antero-lateral wall, posterior wall, and mid inferior segment are akinetic. The basal inferior segment is hypokinetic. The anterior wall, anterior septum, mid inferoseptal segment, and basal inferoseptal segment are normal. Right Ventricle: The right ventricular size is normal. No increase in right ventricular wall thickness. Right ventricular systolic function is normal. Tricuspid regurgitation signal is inadequate for assessing PA pressure. Left Atrium: Left atrial size was mild to moderately dilated. Right Atrium: Right atrial size was normal in size. Pericardium: There is no evidence of pericardial effusion. Mitral Valve: The mitral valve  is normal in structure. Trivial mitral valve regurgitation, with centrally-directed jet. Tricuspid Valve: The tricuspid valve is normal in structure. Tricuspid valve regurgitation is trivial. Aortic Valve: The aortic valve is tricuspid. Aortic valve regurgitation is trivial. Aortic valve sclerosis is present, with no evidence of aortic valve stenosis. Pulmonic Valve: The pulmonic valve was grossly normal. Pulmonic valve regurgitation is mild. Aorta: The aortic root is normal in size and structure. IAS/Shunts: No atrial level shunt detected by color flow Doppler.  LEFT VENTRICLE PLAX 2D LVIDd:         6.10 cm      Diastology LVIDs:         4.80 cm      LV e' medial:    3.37 cm/s LV PW:         1.10 cm      LV E/e' medial:  13.4 LV IVS:        1.70 cm      LV e' lateral:   6.00 cm/s LVOT diam:     1.60 cm      LV E/e' lateral: 7.6 LV SV:         24 LV SV Index:   13 LVOT Area:     2.01 cm  LV Volumes (MOD)  LV vol d, MOD A2C: 134.0 ml LV vol d, MOD A4C: 152.0 ml LV vol s, MOD A2C: 80.9 ml LV vol s, MOD A4C: 110.0 ml LV SV MOD A2C:     53.1 ml LV SV MOD A4C:     152.0 ml LV SV MOD BP:      55.5 ml RIGHT VENTRICLE RV S prime:     10.60 cm/s TAPSE (M-mode): 2.1 cm LEFT ATRIUM             Index        RIGHT ATRIUM           Index LA diam:        3.90 cm 2.11 cm/m   RA Area:     10.30 cm LA Vol (A2C):   51.8 ml 28.03 ml/m  RA Volume:   23.70 ml  12.82 ml/m LA Vol (A4C):   67.7 ml 36.63 ml/m LA Biplane Vol: 60.6 ml 32.79 ml/m  AORTIC VALVE LVOT Vmax:   61.40 cm/s LVOT Vmean:  46.400 cm/s LVOT VTI:    0.119 m  AORTA Ao Root diam: 2.80 cm MITRAL VALVE MV Area (PHT): 6.17 cm    SHUNTS MV Decel Time: 123 msec    Systemic VTI:  0.12 m MR Peak grad: 65.0 mmHg    Systemic Diam: 1.60 cm MR Mean grad: 48.0 mmHg MR Vmax:      403.00 cm/s MR Vmean:     332.0 cm/s MV E velocity: 45.30 cm/s MV A velocity: 87.10 cm/s MV E/A ratio:  0.52 Mihai Croitoru MD Electronically signed by Sanda Klein MD Signature Date/Time:  04/11/2022/12:57:21 PM    Final    DG Chest Port 1 View  Result Date: 04/11/2022 CLINICAL DATA:  Shortness of breath and chest pain EXAM: PORTABLE CHEST 1 VIEW COMPARISON:  05/13/2020 FINDINGS: Cardiac shadow is stable. Vascular congestion is noted centrally with mild edema consistent with CHF. No focal confluent infiltrate is seen. No effusion is noted. No acute bony abnormality is seen. IMPRESSION: Mild CHF. Electronically Signed   By: Inez Catalina M.D.   On: 04/11/2022 03:21    Assessment and Plan:   # NTSEMI. Has known CAD and likely this is triggered mild HF symptoms. - npo after midnight for lhc tomorrow - echo ordered for tomorrow - heparin for anticoagulation - continue 81 mg daily  - hold off on p2y12i until cath - start high intensity statin with rosuva '20mg'$ ; check lipid panel  - continue carvedilol beta blocker therapy - check CBC, CMP, INR, hemoglobin A1c, tsh/FT4 - referral for cardiac rehab  # Volume overload - order echo tomorrow - hold on further lasix for now until cath and echo      For questions or updates, please contact [place] Please consult [email or web address] for contact info under     Signed, Doyne Keel, MD  04/11/2022 7:28 PM

## 2022-04-11 NOTE — ED Triage Notes (Signed)
Pt BIB [facility] from home EMS called out for chest pain, however when they arrived they found pt in respiratory distress with congestion. Pt 88% on RA however sats didn't improve with 6L [place], pt placed on NRB sats improved to 98%.

## 2022-04-11 NOTE — Progress Notes (Signed)
*  PRELIMINARY RESULTS* Echocardiogram 2D Echocardiogram has been performed.  Susan Patel 04/11/2022, 10:52 AM

## 2022-04-11 NOTE — ED Provider Notes (Addendum)
[facility] EMERGENCY DEPARTMENT Provider Note   CSN: [id number] Arrival date & time: 04/11/22  0235     History  Chief Complaint  Patient presents with   Respiratory Distress    Susan Patel is a 81 y.o. female.  The history is provided by the EMS personnel. The history is limited by the condition of the patient (Patient is nonverbal).  She has history of hypertension, diabetes, stroke, coronary artery disease, COPD and comes in by ambulance because of shortness of breath.  EMS was apparently called out for chest pain but noted the patient was in respiratory distress with oxygen saturation of 88% on room air.  She was given oxygen via nonrebreather mask and oxygen saturation improved to 98%.  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   Home Medications Prior to Admission medications   Medication Sig Start Date End Date Taking? Authorizing Provider  acetaminophen (TYLENOL) 325 MG tablet Take 2 tablets (650 mg total) by mouth every 6 (six) hours as needed. 02/23/18   Varney Biles, MD  albuterol (VENTOLIN HFA) 108 (90 Base) MCG/ACT inhaler INHALE 1-2 PUFFS BY MOUTH EVERY 6 HOURS AS NEEDED FOR WHEEZE OR SHORTNESS OF BREATH 09/01/20   Dettinger, Fransisca Kaufmann, MD  B-D UF III MINI PEN NEEDLES 31G X 5 MM MISC USE DAILY AS DIRECTED 11/03/21   Dettinger, Fransisca Kaufmann, MD  carvedilol (COREG) 12.5 MG tablet Take 1 tablet (12.5 mg total) by mouth 2 (two) times daily. 03/11/22   Dettinger, Fransisca Kaufmann, MD  Continuous Blood Gluc Receiver ([place]) [place] 1 each by Does not apply route 4 (four) times daily. 03/11/22   Dettinger, Fransisca Kaufmann, MD  Continuous Blood Gluc Sensor (DEXCOM G6 SENSOR) MISC 1 each by Does not apply route once a week. 03/11/22   Dettinger, Fransisca Kaufmann, MD  Continuous Blood Gluc Transmit (DEXCOM G6 TRANSMITTER) MISC 1 each by Does not apply route 4 (four) times daily. 03/11/22   Dettinger, Fransisca Kaufmann, MD  [employer] ASPIRIN LOW STRENGTH 81 MG tablet TAKE 1  TABLET BY MOUTH EVERY DAY 11/03/21   Dettinger, Fransisca Kaufmann, MD  Dulaglutide (TRULICITY) 5.62 BW/3.8LH SOPN Inject 0.75 mg into the skin once a week. 04/06/22   Dettinger, Fransisca Kaufmann, MD  famotidine (PEPCID) 20 MG tablet TAKE 1 TABLET BY MOUTH EVERY DAY AFTER SUPPER 04/29/21   Tanda Rockers, MD  fluticasone [facility]) 50 MCG/ACT nasal spray Place 1 spray into both nostrils 2 (two) times daily as needed for allergies or rhinitis. 12/17/19   Dettinger, Fransisca Kaufmann, MD  glucose blood test strip Use as instructed 07/04/17   Eustaquio Maize, MD  Insulin Glargine [facility]) 100 UNIT/ML Inject 10 Units into the skin daily. 04/06/22   Dettinger, Fransisca Kaufmann, MD  isosorbide mononitrate (IMDUR) 30 MG 24 hr tablet TAKE 1 TABLET BY MOUTH EVERY DAY 12/09/21   Ahmed Prima, Fransisco Hertz, PA-C  linaclotide [facility]) 290 MCG CAPS capsule Take 1 capsule (290 mcg total) by mouth daily before breakfast. 06/02/20   Dettinger, Fransisca Kaufmann, MD  methocarbamol (ROBAXIN) 750 MG tablet Take 1 tablet (750 mg total) by mouth at bedtime as needed. 06/02/20   Dettinger, Fransisca Kaufmann, MD  montelukast (SINGULAIR) 10 MG tablet Take 1 tablet (10 mg total) by mouth daily. 03/11/22   Dettinger, Fransisca Kaufmann, MD  nitroGLYCERIN (NITROSTAT) 0.4 MG SL tablet Place 1 tablet (0.4 mg total) under the tongue every 5 (five) minutes as needed. 08/08/20   Leonides Sake,  Lavetta Nielsen., NP  [facility] DELICA LANCETS FINE MISC TEST TWICE DAILY 12/26/17   Eustaquio Maize, MD  OXYGEN Inhale 3-4 L into the lungs at bedtime.    [provider]  pantoprazole (PROTONIX) 40 MG tablet TAKE 1 TABLET (40 MG TOTAL) BY MOUTH DAILY. TAKE 30-60 MIN BEFORE FIRST MEAL OF THE DAY 04/29/21   Tanda Rockers, MD  rosuvastatin (CRESTOR) 20 MG tablet Take 1 tablet (20 mg total) by mouth daily. 03/11/22   Dettinger, Fransisca Kaufmann, MD  traZODone (DESYREL) 50 MG tablet TAKE 1 TABLET BY MOUTH AT BEDTIME AS NEEDED FOR SLEEP. 07/01/21   Dettinger, Fransisca Kaufmann, MD  vitamin B-12 (CYANOCOBALAMIN) 1000 MCG tablet  Take 1,000 mcg by mouth daily.    [provider]      Allergies    Azithromycin, Prednisone, Sulfamethoxazole-trimethoprim, Amlodipine, Ciprofloxacin, and Tape    Review of Systems   Review of Systems  Unable to perform ROS: Patient nonverbal    Physical Exam Updated Vital Signs BP (!) 157/113   Pulse 94   Temp 97.9 F (36.6 C) (Oral)   Resp (!) 31   Ht '5\' 1"'$  (1.549 m)   Wt 86.2 kg   SpO2 98%   BMI 35.91 kg/m  Physical Exam Vitals and nursing note reviewed.   81 year old female in mild respiratory distress. Vital signs are significant for elevated respiratory rate and blood pressure. Oxygen saturation is 98%, which is normal. Head is normocephalic and atraumatic. PERRLA, EOMI. Oropharynx is clear. Neck is nontender and supple without adenopathy or JVD. Back is nontender and there is no CVA tenderness. Lungs have coarse expiratory rhonchi diffusely with a few scattered wheezes and scattered rales. Chest is nontender. Heart has regular rate and rhythm without murmur. Abdomen is soft, flat, nontender without masses or hepatosplenomegaly and peristalsis is normoactive. Extremities have 2+ edema, full range of motion is present. Skin is warm and dry without rash. Neurologic: Awake and alert but nonverbal, cranial nerves are intact, moves all extremities equally.  ED Results / Procedures / Treatments   Labs (all labs ordered are listed, but only abnormal results are displayed) Labs Reviewed  BRAIN NATRIURETIC PEPTIDE  BASIC METABOLIC PANEL  CBC WITH DIFFERENTIAL/PLATELET  TROPONIN I (HIGH SENSITIVITY)    EKG EKG Interpretation  Date/Time:  Sunday April 11 2022 02:53:21 EST Ventricular Rate:  95 PR Interval:  131 QRS Duration: 126 QT Interval:  396 QTC Calculation: 498 R Axis:   45 Text Interpretation: Sinus rhythm Consider right atrial enlargement LVH with secondary repolarization abnormality Borderline prolonged QT interval Artifact in lead(s) I III  aVR aVL aVF When compared with ECG of 11/22/2019, REPOLARIZATION ABNORMALITY is slightly more pronounced Confirmed by Delora Fuel ([id number]) on 04/11/2022 3:02:58 AM  Radiology DG Chest Port 1 View  Result Date: 04/11/2022 CLINICAL DATA:  Shortness of breath and chest pain EXAM: PORTABLE CHEST 1 VIEW COMPARISON:  05/13/2020 FINDINGS: Cardiac shadow is stable. Vascular congestion is noted centrally with mild edema consistent with CHF. No focal confluent infiltrate is seen. No effusion is noted. No acute bony abnormality is seen. IMPRESSION: Mild CHF. Electronically Signed   By: Inez Catalina M.D.   On: 04/11/2022 03:21    Procedures Procedures  Cardiac monitor shows sinus rhythm, per my interpretation.  Medications Ordered in ED Medications - No data to display  ED Course/ Medical Decision Making/ A&P  Medical Decision Making Amount and/or Complexity of Data Reviewed Labs: ordered. Radiology: ordered.  Risk OTC drugs. Prescription drug management. Decision regarding hospitalization.   Acute respiratory distress.  Possible COPD exacerbation, possible pneumonia, possible heart failure exacerbation, possibly multifactorial.  I have ordered chest x-ray, electrocardiogram, laboratory work-up with CBC, basic metabolic panel, and BNP, troponin.  I have reviewed her past records, and she had an echocardiogram on 09/14/2021 where ejection fraction was 50-55% and there was grade 1 diastolic dysfunction.  Left heart catheterization on 05/23/2018 showed 60% stenosis proximal RCA, 60% stenosis distal left main, 50% stenosis of the proximal circumflex, 70% stenosis mid LAD.  She clearly has myocardium at risk.  I had cardiology office visit on 08/25/2021, she was noted to have no peripheral edema.  Electrocardiogram shows LVH with secondary repolarization abnormality which is slightly more pronounced than from prior ECG in 2021.  Chest x-ray shows changes of congestive heart failure.   I independently viewed the image, and agree with radiologist's interpretation.  I have reviewed and interpreted her laboratory test, and my interpretation is mildly elevated random glucose, mildly elevated alkaline phosphatase level, normal CBC, moderately elevated BNP and moderately elevated troponin.  At this point, I suspect that her troponin elevation is more likely demand ischemia and non-STEMI, but will need to trend troponins.  I have elected to treat her as an exacerbation of heart failure.  I have ordered intravenous furosemide, topical nitroglycerin.  Because of elevated troponin, I have ordered oral aspirin.  At this point, I have elected not to initiate anticoagulation.  Case is discussed with Dr. Clearence Ped of [facility], who agrees to admit the patient.  CRITICAL CARE Performed by: Delora Fuel Total critical care time: 45 minutes Critical care time was exclusive of separately billable procedures and treating other patients. Critical care was necessary to treat or prevent imminent or life-threatening deterioration. Critical care was time spent personally by me on the following activities: development of treatment plan with patient and/or surrogate as well as nursing, discussions with consultants, evaluation of patient's response to treatment, examination of patient, obtaining history from patient or surrogate, ordering and performing treatments and interventions, ordering and review of laboratory studies, ordering and review of radiographic studies, pulse oximetry and re-evaluation of patient's condition.  Final Clinical Impression(s) / ED Diagnoses Final diagnoses:  Acute on chronic diastolic heart failure (HCC)  Elevated troponin  Elevated random blood glucose level    Rx / DC Orders ED Discharge Orders     None         Delora Fuel, MD 16/38/46 [phone]  Repeat troponin has increased to 1394.  I am ordering intravenous heparin for possible non-STEMI.   Delora Fuel, MD 35/70/17 [phone]

## 2022-04-12 ENCOUNTER — Other Ambulatory Visit (HOSPITAL_COMMUNITY): Payer: Self-pay

## 2022-04-12 ENCOUNTER — Encounter (HOSPITAL_COMMUNITY)
Admission: EM | Disposition: A | Discharge status: Hospice | Payer: Self-pay | Source: Home / Self Care | Attending: Internal Medicine

## 2022-04-12 DIAGNOSIS — I214 Non-ST elevation (NSTEMI) myocardial infarction: Secondary | ICD-10-CM | POA: Diagnosis not present

## 2022-04-12 DIAGNOSIS — I2510 Atherosclerotic heart disease of native coronary artery without angina pectoris: Secondary | ICD-10-CM | POA: Diagnosis not present

## 2022-04-12 DIAGNOSIS — I447 Left bundle-branch block, unspecified: Secondary | ICD-10-CM | POA: Diagnosis not present

## 2022-04-12 DIAGNOSIS — I5021 Acute systolic (congestive) heart failure: Secondary | ICD-10-CM | POA: Diagnosis not present

## 2022-04-12 HISTORY — PX: RIGHT HEART CATH AND CORONARY ANGIOGRAPHY: CATH118264

## 2022-04-12 LAB — POCT I-STAT EG7
Acid-Base Excess: 5 mmol/L — ABNORMAL HIGH (ref 0.0–2.0)
Bicarbonate: 29.6 mmol/L — ABNORMAL HIGH (ref 20.0–28.0)
Calcium, Ion: 1.12 mmol/L — ABNORMAL LOW (ref 1.15–1.40)
HCT: 41 % (ref 36.0–46.0)
Hemoglobin: 13.9 g/dL (ref 12.0–15.0)
O2 Saturation: 60 %
Potassium: 3.4 mmol/L — ABNORMAL LOW (ref 3.5–5.1)
Sodium: 141 mmol/L (ref 135–145)
TCO2: 31 mmol/L (ref 22–32)
pCO2, Ven: 43.9 mmHg — ABNORMAL LOW (ref 44–60)
pH, Ven: 7.437 — ABNORMAL HIGH (ref 7.25–7.43)
pO2, Ven: 30 mmHg — CL (ref 32–45)

## 2022-04-12 LAB — BASIC METABOLIC PANEL
Anion gap: 14 (ref 5–15)
BUN: 14 mg/dL (ref 8–23)
CO2: 26 mmol/L (ref 22–32)
Calcium: 8.5 mg/dL — ABNORMAL LOW (ref 8.9–10.3)
Chloride: 100 mmol/L (ref 98–111)
Creatinine, Ser: 1.04 mg/dL — ABNORMAL HIGH (ref 0.44–1.00)
GFR, Estimated: 55 mL/min — ABNORMAL LOW (ref >60)
Glucose, Bld: 142 mg/dL — ABNORMAL HIGH (ref 70–99)
Potassium: 3.1 mmol/L — ABNORMAL LOW (ref 3.5–5.1)
Sodium: 140 mmol/L (ref 135–145)

## 2022-04-12 LAB — GLUCOSE, CAPILLARY
Glucose-Capillary: 101 mg/dL — ABNORMAL HIGH (ref 70–99)
Glucose-Capillary: 105 mg/dL — ABNORMAL HIGH (ref 70–99)
Glucose-Capillary: 97 mg/dL (ref 70–99)
Glucose-Capillary: 91 mg/dL (ref 70–99)

## 2022-04-12 LAB — CBC
HCT: 43.3 % (ref 36.0–46.0)
Hemoglobin: 14.3 g/dL (ref 12.0–15.0)
MCH: 28.9 pg (ref 26.0–34.0)
MCHC: 33 g/dL (ref 30.0–36.0)
MCV: 87.7 fL (ref 80.0–100.0)
Platelets: 219 10*3/uL (ref 150–400)
RBC: 4.94 MIL/uL (ref 3.87–5.11)
RDW: 13.8 % (ref 11.5–15.5)
WBC: 8.9 10*3/uL (ref 4.0–10.5)
nRBC: 0 % (ref 0.0–0.2)

## 2022-04-12 LAB — HEPARIN LEVEL (UNFRACTIONATED)
Heparin Unfractionated: 0.61 IU/mL (ref 0.30–0.70)
Heparin Unfractionated: 0.78 IU/mL — ABNORMAL HIGH (ref 0.30–0.70)

## 2022-04-12 LAB — HEMOGLOBIN A1C
Hgb A1c MFr Bld: 6.6 % — ABNORMAL HIGH (ref 4.8–5.6)
Mean Plasma Glucose: 143 mg/dL

## 2022-04-12 LAB — MAGNESIUM: Magnesium: 2.1 mg/dL (ref 1.7–2.4)

## 2022-04-12 LAB — TROPONIN I (HIGH SENSITIVITY): Troponin I (High Sensitivity): 6954 ng/L (ref <18)

## 2022-04-12 LAB — BRAIN NATRIURETIC PEPTIDE: B Natriuretic Peptide: 1313.1 pg/mL — ABNORMAL HIGH (ref 0.0–100.0)

## 2022-04-12 SURGERY — RIGHT HEART CATH AND CORONARY ANGIOGRAPHY
Anesthesia: LOCAL

## 2022-04-12 MED ORDER — HEPARIN SODIUM (PORCINE) 1000 UNIT/ML IJ SOLN
INTRAMUSCULAR | Status: AC
  Filled 2022-04-12: qty 10

## 2022-04-12 MED ORDER — CLOPIDOGREL BISULFATE 75 MG PO TABS
75.0000 mg | ORAL_TABLET | Freq: Every day | ORAL | Status: DC
  Administered 2022-04-15: 75 mg via ORAL
  Filled 2022-04-12 (×2): qty 1

## 2022-04-12 MED ORDER — LIDOCAINE HCL (PF) 1 % IJ SOLN
INTRAMUSCULAR | Status: AC
  Filled 2022-04-12: qty 30

## 2022-04-12 MED ORDER — ENOXAPARIN SODIUM 40 MG/0.4ML IJ SOSY
40.0000 mg | PREFILLED_SYRINGE | INTRAMUSCULAR | Status: DC
  Administered 2022-04-13 – 2022-04-15 (×2): 40 mg via SUBCUTANEOUS
  Filled 2022-04-12 (×2): qty 0.4

## 2022-04-12 MED ORDER — SODIUM CHLORIDE 0.9 % IV SOLN: 250.0000 mL | INTRAVENOUS | Status: DC | PRN

## 2022-04-12 MED ORDER — VERAPAMIL HCL 2.5 MG/ML IV SOLN
INTRAVENOUS | Status: AC
  Filled 2022-04-12: qty 2

## 2022-04-12 MED ORDER — SODIUM CHLORIDE 0.9% FLUSH
3.0000 mL | Freq: Two times a day (BID) | INTRAVENOUS | Status: DC
  Administered 2022-04-13 – 2022-04-15 (×4): 3 mL via INTRAVENOUS

## 2022-04-12 MED ORDER — LIDOCAINE HCL (PF) 1 % IJ SOLN
INTRAMUSCULAR | Status: DC | PRN
  Administered 2022-04-12 (×2): 2 mL

## 2022-04-12 MED ORDER — HEPARIN (PORCINE) IN NACL 1000-0.9 UT/500ML-% IV SOLN
INTRAVENOUS | Status: AC
  Filled 2022-04-12: qty 1000

## 2022-04-12 MED ORDER — HEPARIN SODIUM (PORCINE) 1000 UNIT/ML IJ SOLN
INTRAMUSCULAR | Status: DC | PRN
  Administered 2022-04-12: 4000 [IU] via INTRAVENOUS

## 2022-04-12 MED ORDER — HEPARIN (PORCINE) IN NACL 1000-0.9 UT/500ML-% IV SOLN
INTRAVENOUS | Status: DC | PRN
  Administered 2022-04-12 (×2): 500 mL

## 2022-04-12 MED ORDER — SODIUM CHLORIDE 0.9% FLUSH: 3.0000 mL | INTRAVENOUS | Status: DC | PRN

## 2022-04-12 MED ORDER — ROSUVASTATIN CALCIUM 20 MG PO TABS
40.0000 mg | ORAL_TABLET | Freq: Every day | ORAL | Status: DC
  Administered 2022-04-12 – 2022-04-15 (×2): 40 mg via ORAL
  Filled 2022-04-12 (×3): qty 2

## 2022-04-12 MED ORDER — SODIUM CHLORIDE 0.9 % IV SOLN: INTRAVENOUS | Status: DC

## 2022-04-12 MED ORDER — HYDRALAZINE HCL 20 MG/ML IJ SOLN: 10.0000 mg | INTRAMUSCULAR | Status: AC | PRN

## 2022-04-12 MED ORDER — VERAPAMIL HCL 2.5 MG/ML IV SOLN
INTRAVENOUS | Status: DC | PRN
  Administered 2022-04-12: 10 mL via INTRA_ARTERIAL

## 2022-04-12 MED ORDER — SODIUM CHLORIDE 0.9% FLUSH: 3.0000 mL | Freq: Two times a day (BID) | INTRAVENOUS | Status: DC

## 2022-04-12 MED ORDER — PANTOPRAZOLE SODIUM 40 MG IV SOLR
40.0000 mg | INTRAVENOUS | Status: DC
  Administered 2022-04-13: 40 mg via INTRAVENOUS
  Filled 2022-04-12: qty 10

## 2022-04-12 MED ORDER — POTASSIUM CHLORIDE CRYS ER 20 MEQ PO TBCR
40.0000 meq | EXTENDED_RELEASE_TABLET | Freq: Once | ORAL | Status: AC
  Administered 2022-04-12: 40 meq via ORAL
  Filled 2022-04-12: qty 2

## 2022-04-12 MED ORDER — METOPROLOL TARTRATE 5 MG/5ML IV SOLN
5.0000 mg | Freq: Two times a day (BID) | INTRAVENOUS | Status: AC
  Administered 2022-04-12 – 2022-04-13 (×3): 5 mg via INTRAVENOUS
  Filled 2022-04-12 (×3): qty 5

## 2022-04-12 MED ORDER — IOHEXOL 350 MG/ML SOLN
INTRAVENOUS | Status: DC | PRN
  Administered 2022-04-12: 35 mL

## 2022-04-12 SURGICAL SUPPLY — 11 items
CATH 5FR JL3.5 JR4 ANG PIG MP (CATHETERS) IMPLANT
CATH BALLN WEDGE 5F 110CM (CATHETERS) IMPLANT
DEVICE RAD COMP TR BAND LRG (VASCULAR PRODUCTS) IMPLANT
GLIDESHEATH SLEND SS 6F .021 (SHEATH) IMPLANT
GUIDEWIRE INQWIRE 1.5J.035X260 (WIRE) IMPLANT
INQWIRE 1.5J .035X260CM (WIRE) ×1
KIT HEART LEFT (KITS) ×1 IMPLANT
PACK CARDIAC CATHETERIZATION (CUSTOM PROCEDURE TRAY) ×1 IMPLANT
SHEATH GLIDE SLENDER 4/5FR (SHEATH) IMPLANT
TRANSDUCER W/STOPCOCK (MISCELLANEOUS) ×1 IMPLANT
TUBING CIL FLEX 10 FLL-RA (TUBING) ×1 IMPLANT

## 2022-04-12 NOTE — Progress Notes (Signed)
[facility]  Date: 04/12/22  Time: 5:44 PM  Patient present for [facility] with possible PCI.  She is DNR/DNI status.  She and her husband agree to rescinding DNR and being Full Code during the catheterization.  DNR status will resume after completion of the procedure.  Nelva Bush, MD [facility]

## 2022-04-12 NOTE — Progress Notes (Addendum)
Rounding Note    Patient Name: Susan Patel Date of Encounter: 04/12/2022  [place] Cardiologist: Fransico Him, MD   Subjective   No acute overnight events. Patient is very sleepy this morning. She will briefly respond to questions but will not even open her eyes to talk with me. She denies any chest pain and is breathing comfortably on 4L of O2 via nasal cannula.  Inpatient Medications    Scheduled Meds:  aspirin EC  81 mg Oral Daily   carvedilol  6.25 mg Oral BID   insulin aspart  0-5 Units Subcutaneous QHS   insulin aspart  0-9 Units Subcutaneous TID WC   ipratropium-albuterol  3 mL Nebulization TID   isosorbide mononitrate  30 mg Oral Daily   montelukast  10 mg Oral Daily   pantoprazole  40 mg Oral QAC breakfast   potassium chloride  40 mEq Oral Once   rosuvastatin  40 mg Oral Daily   Continuous Infusions:  heparin 1,000 Units/hr (04/12/22 0553)   PRN Meds: acetaminophen **OR** acetaminophen, albuterol, nitroGLYCERIN, ondansetron **OR** ondansetron (ZOFRAN) IV, traZODone   Vital Signs    Vitals:   04/11/22 1841 04/11/22 1941 04/11/22 2311 04/12/22 0426  BP: 133/76 126/68 (!) 140/72 (!) 97/53  Pulse:  87 73 65  Resp: (!) '21 20 20 19  '$ Temp: 97.8 F (36.6 C) 99.9 F (37.7 C) 98 F (36.7 C) 98.2 F (36.8 C)  TempSrc: Oral Axillary Axillary Oral  SpO2:  97% 94% 96%  Weight:    82.7 kg  Height:        Intake/Output Summary (Last 24 hours) at 04/12/2022 0708 Last data filed at 04/12/2022 1829 Gross per 24 hour  Intake 247.94 ml  Output 1625 ml  Net -1377.06 ml      04/12/2022    4:26 AM 04/11/2022    2:47 AM 03/11/2022    4:26 PM  Last 3 Weights  Weight (lbs) 182 lb 5.1 oz 190 lb 0.6 oz 190 lb  Weight (kg) 82.7 kg 86.2 kg 86.183 kg      Telemetry    Sinus rhythm with rates mostly in the 60s to 70s. PVCs noted. - Personally Reviewed  ECG    No new ECG tracing today. - Personally Reviewed  Physical Exam   GEN: Elderly  Caucasian female resting comfortably in no acute distress.  On 4L of O2 via nasal cannula. Neck: No significant JVD noted. Cardiac: RRR with occasional ectopy. No murmurs, rubs, or gallops.  Respiratory: Clear to auscultation anteriorly with no wheezes, rhonchi, or rales noted. GI: Soft and non-distended. MS: No lower extremity edema; No deformity. Skin: Warm and dry. Neuro:  Very sleepy. Follows commands. Psych: Very sleepy.  Labs    High Sensitivity Troponin:   Recent Labs  Lab 04/11/22 0245 04/11/22 0558  TROPONINIHS 350* 1,394*     Chemistry Recent Labs  Lab 04/11/22 0245 04/12/22 0029  NA 140 140  K 3.7 3.1*  CL 108 100  CO2 24 26  GLUCOSE 125* 142*  BUN 21 14  CREATININE 0.80 1.04*  CALCIUM 8.5* 8.5*  GFRNONAA >60 55*  ANIONGAP 8 14    Lipids No results for input(s): "CHOL", "TRIG", "HDL", "LABVLDL", "LDLCALC", "CHOLHDL" in the last 168 hours.  Hematology Recent Labs  Lab 04/11/22 0245 04/12/22 0029  WBC 8.8 8.9  RBC 4.69 4.94  HGB 13.7 14.3  HCT 41.4 43.3  MCV 88.3 87.7  MCH 29.2 28.9  MCHC 33.1 33.0  RDW 13.9 13.8  PLT 222 219   Thyroid No results for input(s): "TSH", "FREET4" in the last 168 hours.  BNP Recent Labs  Lab 04/11/22 0245 04/12/22 0029  BNP 563.0* 1,313.1*    DDimer No results for input(s): "DDIMER" in the last 168 hours.   Radiology    ECHOCARDIOGRAM COMPLETE  Result Date: 04/11/2022    ECHOCARDIOGRAM REPORT   Patient Name:   DIARA CHAUDHARI Date of Exam: 04/11/2022 Medical Rec #:  [id number]      Height:       61.0 in Accession #:    [id number]     Weight:       190.0 lb Date of Birth:  08-11-1940      BSA:          1.848 m Patient Age:    81 years       BP:           159/85 mmHg Patient Gender: F              HR:           80 bpm. Exam Location:  [facility] Procedure: 2D Echo, Cardiac Doppler and Color Doppler Indications:    Elevated Troponin  History:        Patient has prior history of Echocardiogram examinations, most                  recent 09/14/2021. CHF, Stroke and COPD; Risk                 Factors:Hypertension, Diabetes and Dyslipidemia.  Sonographer:    Alvino Chapel RCS Referring Phys: [place]  1. Left ventricular ejection fraction, by estimation, is 25 to 30%. The left ventricle has severely decreased function. The left ventricle demonstrates regional wall motion abnormalities (see scoring diagram/findings for description). The left ventricular internal cavity size was moderately dilated. There is moderate left ventricular hypertrophy. Left ventricular diastolic parameters are consistent with Grade I diastolic dysfunction (impaired relaxation). There is akinesis of the left ventricular, entire inferolateral wall. There is mild dyskinesis of the left ventricular, entire apical segment. There is severe hypokinesis of the left ventricular, inferior wall.  2. Right ventricular systolic function is normal. The right ventricular size is normal. Tricuspid regurgitation signal is inadequate for assessing PA pressure.  3. Left atrial size was mild to moderately dilated.  4. The mitral valve is normal in structure. Trivial mitral valve regurgitation.  5. The aortic valve is tricuspid. Aortic valve regurgitation is trivial. Aortic valve sclerosis is present, with no evidence of aortic valve stenosis. Comparison(s): The left ventricular function is significantly worse. The left ventricular wall motion abnormalities are new. FINDINGS  Left Ventricle: No LV apical thrombus is seen (Definity contrast was not used). Left ventricular ejection fraction, by estimation, is 25 to 30%. The left ventricle has severely decreased function. The left ventricle demonstrates regional wall motion abnormalities. Mild dyskinesis of the left ventricular, entire apical segment. The left ventricular internal cavity size was moderately dilated. There is moderate left ventricular hypertrophy. Left ventricular diastolic parameters are consistent  with Grade I diastolic dysfunction (impaired relaxation). Normal left ventricular filling pressure.  LV Wall Scoring: The entire apex is dyskinetic. The antero-lateral wall, posterior wall, and mid inferior segment are akinetic. The basal inferior segment is hypokinetic. The anterior wall, anterior septum, mid inferoseptal segment, and basal inferoseptal segment are normal. Right Ventricle: The right ventricular size is normal. No increase in right  ventricular wall thickness. Right ventricular systolic function is normal. Tricuspid regurgitation signal is inadequate for assessing PA pressure. Left Atrium: Left atrial size was mild to moderately dilated. Right Atrium: Right atrial size was normal in size. Pericardium: There is no evidence of pericardial effusion. Mitral Valve: The mitral valve is normal in structure. Trivial mitral valve regurgitation, with centrally-directed jet. Tricuspid Valve: The tricuspid valve is normal in structure. Tricuspid valve regurgitation is trivial. Aortic Valve: The aortic valve is tricuspid. Aortic valve regurgitation is trivial. Aortic valve sclerosis is present, with no evidence of aortic valve stenosis. Pulmonic Valve: The pulmonic valve was grossly normal. Pulmonic valve regurgitation is mild. Aorta: The aortic root is normal in size and structure. IAS/Shunts: No atrial level shunt detected by color flow Doppler.  LEFT VENTRICLE PLAX 2D LVIDd:         6.10 cm      Diastology LVIDs:         4.80 cm      LV e' medial:    3.37 cm/s LV PW:         1.10 cm      LV E/e' medial:  13.4 LV IVS:        1.70 cm      LV e' lateral:   6.00 cm/s LVOT diam:     1.60 cm      LV E/e' lateral: 7.6 LV SV:         24 LV SV Index:   13 LVOT Area:     2.01 cm  LV Volumes (MOD) LV vol d, MOD A2C: 134.0 ml LV vol d, MOD A4C: 152.0 ml LV vol s, MOD A2C: 80.9 ml LV vol s, MOD A4C: 110.0 ml LV SV MOD A2C:     53.1 ml LV SV MOD A4C:     152.0 ml LV SV MOD BP:      55.5 ml RIGHT VENTRICLE RV S prime:      10.60 cm/s TAPSE (M-mode): 2.1 cm LEFT ATRIUM             Index        RIGHT ATRIUM           Index LA diam:        3.90 cm 2.11 cm/m   RA Area:     10.30 cm LA Vol (A2C):   51.8 ml 28.03 ml/m  RA Volume:   23.70 ml  12.82 ml/m LA Vol (A4C):   67.7 ml 36.63 ml/m LA Biplane Vol: 60.6 ml 32.79 ml/m  AORTIC VALVE LVOT Vmax:   61.40 cm/s LVOT Vmean:  46.400 cm/s LVOT VTI:    0.119 m  AORTA Ao Root diam: 2.80 cm MITRAL VALVE MV Area (PHT): 6.17 cm    SHUNTS MV Decel Time: 123 msec    Systemic VTI:  0.12 m MR Peak grad: 65.0 mmHg    Systemic Diam: 1.60 cm MR Mean grad: 48.0 mmHg MR Vmax:      403.00 cm/s MR Vmean:     332.0 cm/s MV E velocity: 45.30 cm/s MV A velocity: 87.10 cm/s MV E/A ratio:  0.52 Mihai Croitoru MD Electronically signed by Sanda Klein MD Signature Date/Time: 04/11/2022/12:57:21 PM    Final    DG Chest Port 1 View  Result Date: 04/11/2022 CLINICAL DATA:  Shortness of breath and chest pain EXAM: PORTABLE CHEST 1 VIEW COMPARISON:  05/13/2020 FINDINGS: Cardiac shadow is stable. Vascular congestion is noted centrally with mild edema consistent with CHF.  No focal confluent infiltrate is seen. No effusion is noted. No acute bony abnormality is seen. IMPRESSION: Mild CHF. Electronically Signed   By: Inez Catalina M.D.   On: 04/11/2022 03:21    Cardiac Studies   Echocardiogram 04/11/2022: Impressions:  1. Left ventricular ejection fraction, by estimation, is 25 to 30%. The  left ventricle has severely decreased function. The left ventricle  demonstrates regional wall motion abnormalities (see scoring  diagram/findings for description). The left  ventricular internal cavity size was moderately dilated. There is moderate  left ventricular hypertrophy. Left ventricular diastolic parameters are  consistent with Grade I diastolic dysfunction (impaired relaxation). There  is akinesis of the left  ventricular, entire inferolateral wall. There is mild dyskinesis of the  left ventricular,  entire apical segment. There is severe hypokinesis of  the left ventricular, inferior wall.   2. Right ventricular systolic function is normal. The right ventricular  size is normal. Tricuspid regurgitation signal is inadequate for assessing  PA pressure.   3. Left atrial size was mild to moderately dilated.   4. The mitral valve is normal in structure. Trivial mitral valve  regurgitation.   5. The aortic valve is tricuspid. Aortic valve regurgitation is trivial.  Aortic valve sclerosis is present, with no evidence of aortic valve  stenosis.   Comparison(s): The left ventricular function is significantly worse. The  left ventricular wall motion abnormalities are new.   Patient Profile     81 y.o. female with a history of  multivessel CAD noted on cardiac catheterization in 05/2018 (not felt to be a candidate for CABG and treated medically), hypertension, hyperlipidemia, type 2 diabetes mellitus, CVA x2 with residual dysarthria, COPD on O2 at night, GERD, fibromyalgia, and chronic pain who was admitted on 04/11/2022 for NSTEMI  and acute CHF after presenting with chest pain and shortness of breath. Initial noted to be hypoxic and required non-rebreather; however, respiratory status improved after diuresis.  Assessment & Plan    NSTEMI CAD Patient has known severe multivessel disease. LHC in 05/2018 showed 60% stenosis of distal left main, 30% stenosis of ostial to proximal LAD followed by another 30% stenosis of proximal to mid LAD and 70% stenosis of mid LAD, 100% stenosis of proximal to mid LAD, 50% stenosis of ostial to proximal LCX followed by 20% stenosis of proximal to mid LCX, and 50% stenosis of ostial 1st Diag. She was seen by CT surgery at that time and not felt to be a candidate for surgery given underlying medical conditions; therefore, she was treated medically. Now presents with chest pain and shortness of breath. EKG showed normal sinus rhythm with LBBB. High-sensitivity troponin  350 >> 1,394. Echo shows newly reduced EF of 25-30% with multiple wall motion abnormalities. - Currently chest pain free. - Continue IV Heparin. - Continue Coreg 6.'25mg'$  twice daily and Imdur '30mg'$  daily. - Continue Aspirin and Crestor. - Patient will likely need R/LHC. She is very sleepy this morning and was unable to stay awake for me to have this discussion with her. Can try to discuss with her later in the day.  New Onset Acute Systolic CHF Patient presented with chest pain and shortness of breath. She was initial hypoxic and required a non-rebreather. BNP elevated at 563 >> 1,313. Chest x-ray showed vascular congestion noted centrally with mild edema consistent with CHF. Echo showed LVEF of 25-30% with akinesis of the entire inferolateral wall and mild dyskinesis of the entire apical segment as well as severe hypokinesis of  the inferior wall. She was started on IV Lasix with improvement in respiratory status. Documented urinary output of 1.625 L so far. Weight down 8 lbs since yesterday if documented weights are accurate. Creatinine up from 0.90 to 1.04 with diuresis. - Does not appear significantly volume overloaded. - Will hold additional IV Lasix for now given bump in creatinine and soft BP this morning with systolic BP in the 16X to low 100s. - Continue Coreg 6.'25mg'$  twice daily and Imdur '30mg'$  daily. - Will try to add additional GDMT as BP and renal function allow. - Continue to monitor daily weight, strict I/Os, and renal function. - Will likely need R/LHC this admission as above.  Hypertension Patient initially hypertensive but BP now soft this morning with systolic BP in the 09U to low 100s. - Continue Coreg and Imdur as above.  Hyperlipidemia Lipid pane in 02/2022: Total Cholesterol 152, Trigylcerides 103, HDL 47, LDL 86. LDL goal <55. - Will increase home Crestor from '20mg'$  to '40mg'$  daily. - Will need repeat lipid panel and LFTs in 6-8 weeks.  Hypokalemia Potassium 3.1 this morning  after diuresis. - Will replete.  - Will also check Magnesium given PVCs on telemetry.  Otherwise, per primary team: - Type 2 diabetes mellitus - GERD - Fibromyalgia - Chronic pain - History of CVA  For questions or updates, please contact [facility] Please consult [email or web address] for contact info under        Signed, Darreld Mclean, PA-C  04/12/2022, 7:08 AM    ADDENDUM: Repeat troponin came back at 6,954. Spoke with RN who reports patient has come baseline confusion and pockets food. May need to talk to family to determine underlying functional status before we decide on cathing.  Darreld Mclean, PA-C 04/12/2022 7:32 AM  Patient seen and examined with Sande Rives PA-C.  Agree as above, with the following exceptions and changes as noted below. 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 Awaiting husband Wayne's arrival. Discussed her social functioning with her pastor friend who was present for my visit. He mentions she has been struggling with health and functioning for a least several months. Her husband also recently had surgery and is not in optimal health. Difficulty swallowing pills, concern for confusion. Gen: NAD, CV: irregular no murmurs Lungs: clear, Abd: soft, Extrem: Warm, no edema, Neuro/Psych: alert to questions but slow to answer, aphasia and poor speech production, query component of confusion as well. Oriented to place. All available labs, radiology testing, previous records reviewed.   NSTEMI LBBB, new Acute systolic heart failure -She has known multivessel CAD previously recommended for medical management, now with rising troponin and new systolic dysfunction with regional wall motion abnormalities.  She also appears to have a new left bundle branch block.  This is consistent with an ischemic event.  She needs coronary angiography, she presented 04/11/2022 [facility] to [facility] and is approaching 48 hours since presentation.  If her husband consents after discussion today, would consider cardiac catheterization as long as her mental status is at her baseline.  Creatinine mildly elevated from baseline due to diuresis, not prohibitive to cardiac catheterization if indicated.  Due to aphasia and concern for some confusion, patient is unlikely to be able to consent personally.  Continue IV heparin.  Patient feels she is chest pain-free.  Volume status is improved compared to presentation.  May want to consider right heart catheterization  as well.  Will await discussion with husband prior to proceeding, continued medical management at this time. D/w Marita [place] bedside nurse and we will discuss with family when present. She attempted to call husband while I was in the room, without answer.   ADDENDUM: Returned to the room to discuss cardiac catheterization with the patient's husband, the patient, and the patient's sister who are all participating in the conversation.  Consent reviewed in detail and patient's family consents.  Patient ambulated well with physical therapy and seems to be of baseline mental status and no agitation.  We will plan for right and left heart catheterization today. INFORMED CONSENT: I have reviewed the risks, indications, and alternatives to cardiac catheterization, possible angioplasty, and stenting with the patient. Risks include but are not limited to bleeding, infection, vascular injury, stroke, myocardial infarction, arrhythmia, kidney injury, radiation-related injury in the case of prolonged fluoroscopy use, emergency cardiac surgery, and death. The patient understands the risks of serious complication is 1-2 in 2876 with diagnostic cardiac cath and 1-2% or less with angioplasty/stenting.     Elouise Munroe, MD 04/12/22 9:11 AM

## 2022-04-12 NOTE — Evaluation (Addendum)
Physical Therapy Evaluation Patient Details Name: Susan Patel MRN: [id number] DOB: [birth year] Today's Date: 04/12/2022  History of Present Illness  Pt is a 81 y.o. F who presents with chest pain and shortness of breath. Found with greatly elevated troponins; chest x-ray showed fluid overload. Pt started on heparin infusion and given IV Lasix. Significant PMH: coronary artery disease, type 2 diabetes, hypertension, previous stroke with residual dysarthria and aphasia, COPD on 3 to 4 L of oxygen at home.  Clinical Impression  PTA, pt lives with her spouse in a ramped entrance home, uses an upright walker for mobility and requires assist for some ADL's. Pt seems to be fairly close to her functional baseline. Pt ambulating 100 ft with a walker at a min guard assist level. SpO2 96% on 3L O2, HR 74-82 bpm. Pt denies chest pain. Pt does have recent history of 3 falls within past month. Would benefit from HHPT to address balance, strengthening, endurance.     Recommendations for follow up therapy are one component of a multi-disciplinary discharge planning process, led by the attending physician.  Recommendations may be updated based on patient status, additional functional criteria and insurance authorization.  Follow Up Recommendations Home health PT      Assistance Recommended at Discharge Frequent or constant Supervision/Assistance  Patient can return home with the following  A little help with walking and/or transfers;A little help with bathing/dressing/bathroom;Assistance with cooking/housework;Direct supervision/assist for financial management;Direct supervision/assist for medications management;Assist for transportation;Help with stairs or ramp for entrance    Equipment Recommendations None recommended by PT  Recommendations for Other Services       Functional Status Assessment Patient has had a recent decline in their functional status and demonstrates the ability to make significant  improvements in function in a reasonable and predictable amount of time.     Precautions / Restrictions Precautions Precautions: Fall Restrictions Weight Bearing Restrictions: No      Mobility  Bed Mobility               General bed mobility comments: OOB in chair    Transfers Overall transfer level: Needs assistance Equipment used: Rolling walker (2 wheels) Transfers: Sit to/from Stand Sit to Stand: Min assist, Mod assist           General transfer comment: Min-modA to power up from chair to RW. Pt with preference to hold on RW so PT stabilized    Ambulation/Gait Ambulation/Gait assistance: Min assist Gait Distance (Feet): 100 Feet Assistive device: Rolling walker (2 wheels) Gait Pattern/deviations: Step-through pattern, Decreased stride length, Drifts right/left Gait velocity: decreased     General Gait Details: Intermittent minA for steering RW  Stairs            Wheelchair Mobility    Modified Rankin (Stroke Patients Only)       Balance Overall balance assessment: Needs assistance Sitting-balance support: Feet supported Sitting balance-Leahy Scale: Fair     Standing balance support: Bilateral upper extremity supported Standing balance-Leahy Scale: Poor                               Pertinent Vitals/Pain Pain Assessment Pain Assessment: No/denies pain    Home Living Family/patient expects to be discharged to:: Private residence Living Arrangements: Spouse/significant other Available Help at Discharge: Family Type of Home: House Home Access: Ramped entrance       Home Layout: Able to live on main level with bedroom/bathroom Home  Equipment: Tub bench;Wheelchair - manual (upright walker)      Prior Function Prior Level of Function : Needs assist             Mobility Comments: uses upright walker, 3 falls within past 3 months. Pt husband intermittently helps her with bed mobility and transfers ADLs Comments: pt  husband assists with some LB dressing, pt able to bathe herself.     Hand Dominance        Extremity/Trunk Assessment   Upper Extremity Assessment Upper Extremity Assessment: RUE deficits/detail;LUE deficits/detail RUE Deficits / Details: Grossly 5/5 LUE Deficits / Details: Grossly 4/5 proximally    Lower Extremity Assessment Lower Extremity Assessment: RLE deficits/detail;LLE deficits/detail RLE Deficits / Details: Grossly 5/5 LLE Deficits / Details: Grossly 5/5       Communication   Communication: Expressive difficulties  Cognition Arousal/Alertness: Awake/alert Behavior During Therapy: Flat affect Overall Cognitive Status: History of cognitive impairments - at baseline                                 General Comments: Hx stroke with residual dysarthria and aphasia. Pt able to answer yes/no questions        General Comments      Exercises     Assessment/Plan    PT Assessment Patient needs continued PT services  PT Problem List Decreased strength;Decreased activity tolerance;Decreased balance;Decreased mobility       PT Treatment Interventions DME instruction;Gait training;Therapeutic activities;Functional mobility training;Patient/family education;Therapeutic exercise;Balance training    PT Goals (Current goals can be found in the Care Plan section)  Acute Rehab PT Goals Patient Stated Goal: did not state PT Goal Formulation: With patient/family Time For Goal Achievement: 04/26/22 Potential to Achieve Goals: Good    Frequency Min 3X/week     Co-evaluation               AM-PAC PT "6 Clicks" Mobility  Outcome Measure Help needed turning from your back to your side while in a flat bed without using bedrails?: A Little Help needed moving from lying on your back to sitting on the side of a flat bed without using bedrails?: A Little Help needed moving to and from a bed to a chair (including a wheelchair)?: A Little Help needed standing  up from a chair using your arms (e.g., wheelchair or bedside chair)?: A Lot Help needed to walk in hospital room?: A Little Help needed climbing 3-5 steps with a railing? : A Lot 6 Click Score: 16    End of Session Equipment Utilized During Treatment: Gait belt;Oxygen Activity Tolerance: Patient tolerated treatment well Patient left: in chair;with call bell/phone within reach;with chair alarm set;with family/visitor present Nurse Communication: Mobility status PT Visit Diagnosis: Unsteadiness on feet (R26.81);History of falling (Z91.81)    Time: 8416-6063 PT Time Calculation (min) (ACUTE ONLY): 26 min   Charges:   PT Evaluation $PT Eval Moderate Complexity: 1 Mod PT Treatments $Therapeutic Activity: 8-22 mins        Wyona Almas, PT, DPT Acute Rehabilitation Services Office [phone]   Deno Etienne 04/12/2022, 2:32 PM

## 2022-04-12 NOTE — Progress Notes (Signed)
SLP Cancellation Note  Patient Details Name: Susan Patel MRN: [id number] DOB: [birth year]   Cancelled treatment:       Reason Eval/Treat Not Completed: Medical issues which prohibited therapy.   Per RN, pt is NPO for possible surgery today. RN also reported pt demonstrated oral holding of a  whole pill given in applesauce. Will await direction to proceed with BSE when surgery decisions are made.   Susan Patel, [facility], [place] Speech Language Pathologist Office: [phone]  Shonna Chock 04/12/2022, 9:16 AM

## 2022-04-12 NOTE — Evaluation (Signed)
Occupational Therapy Evaluation Patient Details Name: Susan Patel MRN: [id number] DOB: 16-Mar-1941 Today's Date: 04/12/2022   History of Present Illness Pt is a 81 y.o. F who presents with chest pain and shortness of breath. Found with greatly elevated troponins; chest x-ray showed fluid overload. Pt started on heparin infusion and given IV Lasix. Significant PMH: coronary artery disease, type 2 diabetes, hypertension, previous stroke with residual dysarthria and aphasia, COPD on 3 to 4 L of oxygen at home.   Clinical Impression   Susan Patel was evaluated s/p the above admission list, she ambulated with a walker at home and needs some assist for ADLs from family. Upon evaluation pt had functional limitations due to generalized weakness and decreased activity tolerance. Overall pt required min A for transfers, mobility with RW and ADLs. Per family pt is near her functional baseline. OT to continue to follow acutely. Recommend d/c to home with Miguel Barrera.      Recommendations for follow up therapy are one component of a multi-disciplinary discharge planning process, led by the attending physician.  Recommendations may be updated based on patient status, additional functional criteria and insurance authorization.   Follow Up Recommendations  Home health OT     Assistance Recommended at Discharge Frequent or constant Supervision/Assistance  Patient can return home with the following A little help with walking and/or transfers;A little help with bathing/dressing/bathroom;Assistance with cooking/housework;Direct supervision/assist for financial management;Direct supervision/assist for medications management;Assist for transportation;Help with stairs or ramp for entrance    Functional Status Assessment  Patient has had a recent decline in their functional status and demonstrates the ability to make significant improvements in function in a reasonable and predictable amount of time.        Precautions /  Restrictions Precautions Precautions: Fall Restrictions Weight Bearing Restrictions: No      Mobility Bed Mobility Overal bed mobility: Needs Assistance Bed Mobility: Sit to Supine       Sit to supine: Min assist   General bed mobility comments: min A for LEs    Transfers Overall transfer level: Needs assistance Equipment used: Rolling walker (2 wheels) Transfers: Sit to/from Stand Sit to Stand: Min guard           General transfer comment: assist to stabilize RW, pt prefers to pull up from RW      Balance Overall balance assessment: Needs assistance Sitting-balance support: Feet supported Sitting balance-Leahy Scale: Fair     Standing balance support: Bilateral upper extremity supported Standing balance-Leahy Scale: Poor                             ADL either performed or assessed with clinical judgement   ADL Overall ADL's : Needs assistance/impaired Eating/Feeding: NPO   Grooming: Minimal assistance;Sitting   Upper Body Bathing: Minimal assistance;Sitting   Lower Body Bathing: Minimal assistance;Sit to/from stand   Upper Body Dressing : Minimal assistance;Sitting   Lower Body Dressing: Minimal assistance;Sitting/lateral leans   Toilet Transfer: Ambulation;Rolling walker (2 wheels);Minimal assistance   Toileting- Clothing Manipulation and Hygiene: Sitting/lateral lean;Minimal assistance       Functional mobility during ADLs: Rolling walker (2 wheels);Minimal assistance General ADL Comments: increased time and cues needed     Vision Baseline Vision/History: 0 No visual deficits Patient Visual Report: No change from baseline Vision Assessment?: Vision impaired- to be further tested in functional context Additional Comments: L eye closed     Perception Perception Perception Tested?: No   Praxis  Praxis Praxis tested?: Not tested    Pertinent Vitals/Pain Pain Assessment Pain Assessment: No/denies pain        Extremity/Trunk  Assessment Upper Extremity Assessment Upper Extremity Assessment: RUE deficits/detail;LUE deficits/detail RUE Deficits / Details: Grossly 5/5 LUE Deficits / Details: Grossly 4/5 proximally   Lower Extremity Assessment Lower Extremity Assessment: Defer to PT evaluation RLE Deficits / Details: Grossly 5/5 LLE Deficits / Details: Grossly 5/5   Cervical / Trunk Assessment Cervical / Trunk Assessment: Kyphotic (mild)   Communication Communication Communication: Expressive difficulties   Cognition Arousal/Alertness: Awake/alert Behavior During Therapy: Flat affect Overall Cognitive Status: History of cognitive impairments - at baseline                                 General Comments: Hx stroke with residual dysarthria and aphasia. Pt able to answer yes/no questions     General Comments  VSS on 3L. family in room            Susan Patel expects to be discharged to:: Private residence Living Arrangements: Spouse/significant other Available Help at Discharge: Family Type of Home: House Home Access: [place]: Able to live on main level with bedroom/bathroom     Bathroom Shower/Tub: Other (comment)         Home Equipment: Tub bench;Wheelchair - manual          Prior Functioning/Environment Prior Level of Function : Needs assist             Mobility Comments: uses upright walker, 3 falls within past 3 months. Pt husband intermittently helps her with bed mobility and transfers ADLs Comments: pt husband assists with some LB dressing, pt able to bathe herself.        OT Problem List: Decreased strength;Decreased range of motion;Decreased activity tolerance;Impaired balance (sitting and/or standing);Decreased coordination;Decreased cognition;Decreased safety awareness;Decreased knowledge of use of DME or AE;Decreased knowledge of precautions;Cardiopulmonary status limiting activity      OT Treatment/Interventions:  Self-care/ADL training;Therapeutic exercise;DME and/or AE instruction;Therapeutic activities;Patient/family education;Balance training    OT Goals(Current goals can be found in the care plan section) Acute Rehab OT Goals Patient Stated Goal: did not state OT Goal Formulation: With patient Time For Goal Achievement: 04/26/22 Potential to Achieve Goals: Good ADL Goals Pt Will Perform Grooming: with supervision;sitting Pt Will Perform Upper Body Dressing: sitting Pt Will Perform Lower Body Dressing: with supervision;sit to/from stand Pt Will Transfer to Toilet: with supervision;ambulating  OT Frequency: Min 2X/week       AM-PAC OT "6 Clicks" Daily Activity     Outcome Measure Help from another person eating meals?: Total Help from another person taking care of personal grooming?: A Little Help from another person toileting, which includes using toliet, bedpan, or urinal?: A Little Help from another person bathing (including washing, rinsing, drying)?: A Little Help from another person to put on and taking off regular upper body clothing?: A Little Help from another person to put on and taking off regular lower body clothing?: A Little 6 Click Score: 16   End of Session Equipment Utilized During Treatment: Rolling walker (2 wheels);Oxygen Nurse Communication: Mobility status  Activity Tolerance: Patient tolerated treatment well Patient left: in bed;with call bell/phone within reach;with family/visitor present  OT Visit Diagnosis: Unsteadiness on feet (R26.81);Other abnormalities of gait and mobility (R26.89);Muscle weakness (generalized) (M62.81);History of falling (Z91.81)  Time: 0158-6825 OT Time Calculation (min): 17 min Charges:  OT General Charges $OT Visit: 1 Visit OT Evaluation $OT Eval Moderate Complexity: 1 Mod   Kristeena Meineke D Causey 04/12/2022, 4:31 PM

## 2022-04-12 NOTE — H&P (View-Only) (Signed)
Rounding Note    Patient Name: Susan Patel Date of Encounter: 04/12/2022  [facility] Cardiologist: Fransico Him, MD   Subjective   No acute overnight events. Patient is very sleepy this morning. She will briefly respond to questions but will not even open her eyes to talk with me. She denies any chest pain and is breathing comfortably on 4L of O2 via nasal cannula.  Inpatient Medications    Scheduled Meds:  aspirin EC  81 mg Oral Daily   carvedilol  6.25 mg Oral BID   insulin aspart  0-5 Units Subcutaneous QHS   insulin aspart  0-9 Units Subcutaneous TID WC   ipratropium-albuterol  3 mL Nebulization TID   isosorbide mononitrate  30 mg Oral Daily   montelukast  10 mg Oral Daily   pantoprazole  40 mg Oral QAC breakfast   potassium chloride  40 mEq Oral Once   rosuvastatin  40 mg Oral Daily   Continuous Infusions:  heparin 1,000 Units/hr (04/12/22 0553)   PRN Meds: acetaminophen **OR** acetaminophen, albuterol, nitroGLYCERIN, ondansetron **OR** ondansetron (ZOFRAN) IV, traZODone   Vital Signs    Vitals:   04/11/22 1841 04/11/22 1941 04/11/22 2311 04/12/22 0426  BP: 133/76 126/68 (!) 140/72 (!) 97/53  Pulse:  87 73 65  Resp: (!) '21 20 20 19  '$ Temp: 97.8 F (36.6 C) 99.9 F (37.7 C) 98 F (36.7 C) 98.2 F (36.8 C)  TempSrc: Oral Axillary Axillary Oral  SpO2:  97% 94% 96%  Weight:    82.7 kg  Height:        Intake/Output Summary (Last 24 hours) at 04/12/2022 0708 Last data filed at 04/12/2022 5284 Gross per 24 hour  Intake 247.94 ml  Output 1625 ml  Net -1377.06 ml      04/12/2022    4:26 AM 04/11/2022    2:47 AM 03/11/2022    4:26 PM  Last 3 Weights  Weight (lbs) 182 lb 5.1 oz 190 lb 0.6 oz 190 lb  Weight (kg) 82.7 kg 86.2 kg 86.183 kg      Telemetry    Sinus rhythm with rates mostly in the 60s to 70s. PVCs noted. - Personally Reviewed  ECG    No new ECG tracing today. - Personally Reviewed  Physical Exam   GEN: Elderly  Caucasian female resting comfortably in no acute distress.  On 4L of O2 via nasal cannula. Neck: No significant JVD noted. Cardiac: RRR with occasional ectopy. No murmurs, rubs, or gallops.  Respiratory: Clear to auscultation anteriorly with no wheezes, rhonchi, or rales noted. GI: Soft and non-distended. MS: No lower extremity edema; No deformity. Skin: Warm and dry. Neuro:  Very sleepy. Follows commands. Psych: Very sleepy.  Labs    High Sensitivity Troponin:   Recent Labs  Lab 04/11/22 0245 04/11/22 0558  TROPONINIHS 350* 1,394*     Chemistry Recent Labs  Lab 04/11/22 0245 04/12/22 0029  NA 140 140  K 3.7 3.1*  CL 108 100  CO2 24 26  GLUCOSE 125* 142*  BUN 21 14  CREATININE 0.80 1.04*  CALCIUM 8.5* 8.5*  GFRNONAA >60 55*  ANIONGAP 8 14    Lipids No results for input(s): "CHOL", "TRIG", "HDL", "LABVLDL", "LDLCALC", "CHOLHDL" in the last 168 hours.  Hematology Recent Labs  Lab 04/11/22 0245 04/12/22 0029  WBC 8.8 8.9  RBC 4.69 4.94  HGB 13.7 14.3  HCT 41.4 43.3  MCV 88.3 87.7  MCH 29.2 28.9  MCHC 33.1 33.0  RDW 13.9 13.8  PLT 222 219   Thyroid No results for input(s): "TSH", "FREET4" in the last 168 hours.  BNP Recent Labs  Lab 04/11/22 0245 04/12/22 0029  BNP 563.0* 1,313.1*    DDimer No results for input(s): "DDIMER" in the last 168 hours.   Radiology    ECHOCARDIOGRAM COMPLETE  Result Date: 04/11/2022    ECHOCARDIOGRAM REPORT   Patient Name:   Susan Patel Date of Exam: 04/11/2022 Medical Rec #:  [id number]      Height:       61.0 in Accession #:    [id number]     Weight:       190.0 lb Date of Birth:  [birth year]      BSA:          1.848 m Patient Age:    81 years       BP:           159/85 mmHg Patient Gender: F              HR:           80 bpm. Exam Location:  [facility] Procedure: 2D Echo, Cardiac Doppler and Color Doppler Indications:    Elevated Troponin  History:        Patient has prior history of Echocardiogram examinations, most                  recent 09/14/2021. CHF, Stroke and COPD; Risk                 Factors:Hypertension, Diabetes and Dyslipidemia.  Sonographer:    Alvino Chapel RCS Referring Phys: Vincent  1. Left ventricular ejection fraction, by estimation, is 25 to 30%. The left ventricle has severely decreased function. The left ventricle demonstrates regional wall motion abnormalities (see scoring diagram/findings for description). The left ventricular internal cavity size was moderately dilated. There is moderate left ventricular hypertrophy. Left ventricular diastolic parameters are consistent with Grade I diastolic dysfunction (impaired relaxation). There is akinesis of the left ventricular, entire inferolateral wall. There is mild dyskinesis of the left ventricular, entire apical segment. There is severe hypokinesis of the left ventricular, inferior wall.  2. Right ventricular systolic function is normal. The right ventricular size is normal. Tricuspid regurgitation signal is inadequate for assessing PA pressure.  3. Left atrial size was mild to moderately dilated.  4. The mitral valve is normal in structure. Trivial mitral valve regurgitation.  5. The aortic valve is tricuspid. Aortic valve regurgitation is trivial. Aortic valve sclerosis is present, with no evidence of aortic valve stenosis. Comparison(s): The left ventricular function is significantly worse. The left ventricular wall motion abnormalities are new. FINDINGS  Left Ventricle: No LV apical thrombus is seen (Definity contrast was not used). Left ventricular ejection fraction, by estimation, is 25 to 30%. The left ventricle has severely decreased function. The left ventricle demonstrates regional wall motion abnormalities. Mild dyskinesis of the left ventricular, entire apical segment. The left ventricular internal cavity size was moderately dilated. There is moderate left ventricular hypertrophy. Left ventricular diastolic parameters are consistent  with Grade I diastolic dysfunction (impaired relaxation). Normal left ventricular filling pressure.  LV Wall Scoring: The entire apex is dyskinetic. The antero-lateral wall, posterior wall, and mid inferior segment are akinetic. The basal inferior segment is hypokinetic. The anterior wall, anterior septum, mid inferoseptal segment, and basal inferoseptal segment are normal. Right Ventricle: The right ventricular size is normal. No increase in right  ventricular wall thickness. Right ventricular systolic function is normal. Tricuspid regurgitation signal is inadequate for assessing PA pressure. Left Atrium: Left atrial size was mild to moderately dilated. Right Atrium: Right atrial size was normal in size. Pericardium: There is no evidence of pericardial effusion. Mitral Valve: The mitral valve is normal in structure. Trivial mitral valve regurgitation, with centrally-directed jet. Tricuspid Valve: The tricuspid valve is normal in structure. Tricuspid valve regurgitation is trivial. Aortic Valve: The aortic valve is tricuspid. Aortic valve regurgitation is trivial. Aortic valve sclerosis is present, with no evidence of aortic valve stenosis. Pulmonic Valve: The pulmonic valve was grossly normal. Pulmonic valve regurgitation is mild. Aorta: The aortic root is normal in size and structure. IAS/Shunts: No atrial level shunt detected by color flow Doppler.  LEFT VENTRICLE PLAX 2D LVIDd:         6.10 cm      Diastology LVIDs:         4.80 cm      LV e' medial:    3.37 cm/s LV PW:         1.10 cm      LV E/e' medial:  13.4 LV IVS:        1.70 cm      LV e' lateral:   6.00 cm/s LVOT diam:     1.60 cm      LV E/e' lateral: 7.6 LV SV:         24 LV SV Index:   13 LVOT Area:     2.01 cm  LV Volumes (MOD) LV vol d, MOD A2C: 134.0 ml LV vol d, MOD A4C: 152.0 ml LV vol s, MOD A2C: 80.9 ml LV vol s, MOD A4C: 110.0 ml LV SV MOD A2C:     53.1 ml LV SV MOD A4C:     152.0 ml LV SV MOD BP:      55.5 ml RIGHT VENTRICLE RV S prime:      10.60 cm/s TAPSE (M-mode): 2.1 cm LEFT ATRIUM             Index        RIGHT ATRIUM           Index LA diam:        3.90 cm 2.11 cm/m   RA Area:     10.30 cm LA Vol (A2C):   51.8 ml 28.03 ml/m  RA Volume:   23.70 ml  12.82 ml/m LA Vol (A4C):   67.7 ml 36.63 ml/m LA Biplane Vol: 60.6 ml 32.79 ml/m  AORTIC VALVE LVOT Vmax:   61.40 cm/s LVOT Vmean:  46.400 cm/s LVOT VTI:    0.119 m  AORTA Ao Root diam: 2.80 cm MITRAL VALVE MV Area (PHT): 6.17 cm    SHUNTS MV Decel Time: 123 msec    Systemic VTI:  0.12 m MR Peak grad: 65.0 mmHg    Systemic Diam: 1.60 cm MR Mean grad: 48.0 mmHg MR Vmax:      403.00 cm/s MR Vmean:     332.0 cm/s MV E velocity: 45.30 cm/s MV A velocity: 87.10 cm/s MV E/A ratio:  0.52 Mihai Croitoru MD Electronically signed by Sanda Klein MD Signature Date/Time: 04/11/2022/12:57:21 PM    Final    DG Chest Port 1 View  Result Date: 04/11/2022 CLINICAL DATA:  Shortness of breath and chest pain EXAM: PORTABLE CHEST 1 VIEW COMPARISON:  05/13/2020 FINDINGS: Cardiac shadow is stable. Vascular congestion is noted centrally with mild edema consistent with CHF.  No focal confluent infiltrate is seen. No effusion is noted. No acute bony abnormality is seen. IMPRESSION: Mild CHF. Electronically Signed   By: Inez Catalina M.D.   On: 04/11/2022 03:21    Cardiac Studies   Echocardiogram 04/11/2022: Impressions:  1. Left ventricular ejection fraction, by estimation, is 25 to 30%. The  left ventricle has severely decreased function. The left ventricle  demonstrates regional wall motion abnormalities (see scoring  diagram/findings for description). The left  ventricular internal cavity size was moderately dilated. There is moderate  left ventricular hypertrophy. Left ventricular diastolic parameters are  consistent with Grade I diastolic dysfunction (impaired relaxation). There  is akinesis of the left  ventricular, entire inferolateral wall. There is mild dyskinesis of the  left ventricular,  entire apical segment. There is severe hypokinesis of  the left ventricular, inferior wall.   2. Right ventricular systolic function is normal. The right ventricular  size is normal. Tricuspid regurgitation signal is inadequate for assessing  PA pressure.   3. Left atrial size was mild to moderately dilated.   4. The mitral valve is normal in structure. Trivial mitral valve  regurgitation.   5. The aortic valve is tricuspid. Aortic valve regurgitation is trivial.  Aortic valve sclerosis is present, with no evidence of aortic valve  stenosis.   Comparison(s): The left ventricular function is significantly worse. The  left ventricular wall motion abnormalities are new.   Patient Profile     81 y.o. female with a history of  multivessel CAD noted on cardiac catheterization in 05/2018 (not felt to be a candidate for CABG and treated medically), hypertension, hyperlipidemia, type 2 diabetes mellitus, CVA x2 with residual dysarthria, COPD on O2 at night, GERD, fibromyalgia, and chronic pain who was admitted on 04/11/2022 for NSTEMI  and acute CHF after presenting with chest pain and shortness of breath. Initial noted to be hypoxic and required non-rebreather; however, respiratory status improved after diuresis.  Assessment & Plan    NSTEMI CAD Patient has known severe multivessel disease. LHC in 05/2018 showed 60% stenosis of distal left main, 30% stenosis of ostial to proximal LAD followed by another 30% stenosis of proximal to mid LAD and 70% stenosis of mid LAD, 100% stenosis of proximal to mid LAD, 50% stenosis of ostial to proximal LCX followed by 20% stenosis of proximal to mid LCX, and 50% stenosis of ostial 1st Diag. She was seen by CT surgery at that time and not felt to be a candidate for surgery given underlying medical conditions; therefore, she was treated medically. Now presents with chest pain and shortness of breath. EKG showed normal sinus rhythm with LBBB. High-sensitivity troponin  350 >> 1,394. Echo shows newly reduced EF of 25-30% with multiple wall motion abnormalities. - Currently chest pain free. - Continue IV Heparin. - Continue Coreg 6.'25mg'$  twice daily and Imdur '30mg'$  daily. - Continue Aspirin and Crestor. - Patient will likely need R/LHC. She is very sleepy this morning and was unable to stay awake for me to have this discussion with her. Can try to discuss with her later in the day.  New Onset Acute Systolic CHF Patient presented with chest pain and shortness of breath. She was initial hypoxic and required a non-rebreather. BNP elevated at 563 >> 1,313. Chest x-ray showed vascular congestion noted centrally with mild edema consistent with CHF. Echo showed LVEF of 25-30% with akinesis of the entire inferolateral wall and mild dyskinesis of the entire apical segment as well as severe hypokinesis of  the inferior wall. She was started on IV Lasix with improvement in respiratory status. Documented urinary output of 1.625 L so far. Weight down 8 lbs since yesterday if documented weights are accurate. Creatinine up from 0.90 to 1.04 with diuresis. - Does not appear significantly volume overloaded. - Will hold additional IV Lasix for now given bump in creatinine and soft BP this morning with systolic BP in the 60Y to low 100s. - Continue Coreg 6.'25mg'$  twice daily and Imdur '30mg'$  daily. - Will try to add additional GDMT as BP and renal function allow. - Continue to monitor daily weight, strict I/Os, and renal function. - Will likely need R/LHC this admission as above.  Hypertension Patient initially hypertensive but BP now soft this morning with systolic BP in the 30Z to low 100s. - Continue Coreg and Imdur as above.  Hyperlipidemia Lipid pane in 02/2022: Total Cholesterol 152, Trigylcerides 103, HDL 47, LDL 86. LDL goal <55. - Will increase home Crestor from '20mg'$  to '40mg'$  daily. - Will need repeat lipid panel and LFTs in 6-8 weeks.  Hypokalemia Potassium 3.1 this morning  after diuresis. - Will replete.  - Will also check Magnesium given PVCs on telemetry.  Otherwise, per primary team: - Type 2 diabetes mellitus - GERD - Fibromyalgia - Chronic pain - History of CVA  For questions or updates, please contact [place] Please consult [email or web address] for contact info under        Signed, Darreld Mclean, PA-C  04/12/2022, 7:08 AM    ADDENDUM: Repeat troponin came back at 6,954. Spoke with RN who reports patient has come baseline confusion and pockets food. May need to talk to family to determine underlying functional status before we decide on cathing.  Darreld Mclean, PA-C 04/12/2022 7:32 AM  Patient seen and examined with Sande Rives PA-C.  Agree as above, with the following exceptions and changes as noted below. 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 Awaiting husband Wayne's arrival. Discussed her social functioning with her pastor friend who was present for my visit. He mentions she has been struggling with health and functioning for a least several months. Her husband also recently had surgery and is not in optimal health. Difficulty swallowing pills, concern for confusion. Gen: NAD, CV: irregular no murmurs Lungs: clear, Abd: soft, Extrem: Warm, no edema, Neuro/Psych: alert to questions but slow to answer, aphasia and poor speech production, query component of confusion as well. Oriented to place. All available labs, radiology testing, previous records reviewed.   NSTEMI LBBB, new Acute systolic heart failure -She has known multivessel CAD previously recommended for medical management, now with rising troponin and new systolic dysfunction with regional wall motion abnormalities.  She also appears to have a new left bundle branch block.  This is consistent with an ischemic event.  She needs coronary angiography, she presented [facility]/11/2022 overnight to [facility] and is approaching 48 hours since presentation.  If her husband consents after discussion today, would consider cardiac catheterization as long as her mental status is at her baseline.  Creatinine mildly elevated from baseline due to diuresis, not prohibitive to cardiac catheterization if indicated.  Due to aphasia and concern for some confusion, patient is unlikely to be able to consent personally.  Continue IV heparin.  Patient feels she is chest pain-free.  Volume status is improved compared to presentation.  May want to consider right heart catheterization  as well.  Will await discussion with husband prior to proceeding, continued medical management at this time. D/w Marita [place] bedside nurse and we will discuss with family when present. She attempted to call husband while I was in the room, without answer.   ADDENDUM: Returned to the room to discuss cardiac catheterization with the patient's husband, the patient, and the patient's sister who are all participating in the conversation.  Consent reviewed in detail and patient's family consents.  Patient ambulated well with physical therapy and seems to be of baseline mental status and no agitation.  We will plan for right and left heart catheterization today. INFORMED CONSENT: I have reviewed the risks, indications, and alternatives to cardiac catheterization, possible angioplasty, and stenting with the patient. Risks include but are not limited to bleeding, infection, vascular injury, stroke, myocardial infarction, arrhythmia, kidney injury, radiation-related injury in the case of prolonged fluoroscopy use, emergency cardiac surgery, and death. The patient understands the risks of serious complication is 1-2 in 5035 with diagnostic cardiac cath and 1-2% or less with angioplasty/stenting.     Elouise Munroe, MD 04/12/22 9:11 AM

## 2022-04-12 NOTE — Progress Notes (Signed)
ANTICOAGULATION CONSULT NOTE   Pharmacy Consult for heparin Indication: chest pain/ACS  Allergies  Allergen Reactions   Bactrim [Sulfamethoxazole-Trimethoprim] Other (See Comments)    Renal failure   Prednisone Other (See Comments)    Irritability Insomnia    Z-Pak [Azithromycin] Other (See Comments)    Renal failure   Norvasc [Amlodipine] Swelling    Leg swelling   Cipro [Ciprofloxacin Hcl] Itching and Rash   Tape Rash    Patient Measurements: Height: '5\' 1"'$  (154.9 cm) Weight: 82.7 kg (182 lb 5.1 oz) (only 1 sheet, 1 blanket, 1 pillow on bed at time of weight) IBW/kg (Calculated) : 47.8 Heparin Dosing Weight: 70kg  Vital Signs: Temp: 97.7 F (36.5 C) (11/27 0818) Temp Source: Oral (11/27 0818) BP: 117/58 (11/27 0818) Pulse Rate: 67 (11/27 0818)  Labs: Recent Labs    04/11/22 0245 04/11/22 0558 04/11/22 1519 04/12/22 0029 04/12/22 0553 04/12/22 0815  HGB 13.7  --   --  14.3  --   --   HCT 41.4  --   --  43.3  --   --   PLT 222  --   --  219  --   --   HEPARINUNFRC  --   --  0.56 0.61  --  0.78*  CREATININE 0.80  --   --  1.04*  --   --   TROPONINIHS 350* 1,394*  --   --  2,111*  --      Estimated Creatinine Clearance: 42.8 mL/min (A) (by C-G formula based on SCr of 1.04 mg/dL (H)).   Medical History: Past Medical History:  Diagnosis Date   Arthritis    'all over"   Asthma    CAD (coronary artery disease)    Multivessel CAD, not a candidate for CABG, managing medically.   Chronic pain    COPD (chronic obstructive pulmonary disease) (HCC)    O2 per [place] at nights    Depression    Diabetes mellitus without complication (HCC)    borderline- , states she was on med., but MD told her "everything is under control so I threw the bottle away"   Fibromyalgia    GERD (gastroesophageal reflux disease)    Hypertension    Neuromuscular disorder ([place])    parkinson, neuropathy- both feet & hands.   Stroke [facility])    residual dysarthria    Assessment: 81yo  female c/o CP and with NSTEMI. Pharmacy consulted to dose heparin. Plans noted for possible cath -heparin level = 0.78  Goal of Therapy:  Heparin level 0.3-0.7 units/ml Monitor platelets by anticoagulation protocol: Yes   Plan:  -Decrease heparin to 900 units/hr -Recheck heparin level and CBC in am  Hildred Laser, PharmD Clinical Pharmacist **Pharmacist phone directory can now be found on [email or web address] ([email or web address]).  Listed under [place].

## 2022-04-12 NOTE — Progress Notes (Signed)
Mobility Specialist Progress Note    04/12/22 1030  Mobility  Activity Transferred from bed to chair  Level of Assistance Moderate assist, patient does 50-74%  Assistive Device Front wheel walker  Distance Ambulated (ft) 2 ft  Activity Response Tolerated well  Mobility Referral Yes  $Mobility charge 1 Mobility   Pre-Mobility: 66 HR, 95% SpO2 Post-Mobility: 71 HR, 137/80 (97) BP, 98% SpO2  Pt received in bed and agreeable. Pt closing eyes throughout session. MinA for bed mobility. Left in chair with call bell in reach and chair alarm on. RN aware.   Hildred Alamin Mobility Specialist  Please Psychologist, sport and exercise or Rehab Office at [phone]

## 2022-04-12 NOTE — Progress Notes (Signed)
Patient struggled with swallowing while trying to dinner.  Patient held food in her mouth and repeatedly coughed while attempting to swallow.  Both chopped spaghetti and applesauce were attempted.  Patient even had difficulty with swallowing water. MD notified and medications changed to alternate route. Patient now NPO until swallow evaluation is complete.

## 2022-04-12 NOTE — Progress Notes (Signed)
PROGRESS NOTE    Susan Patel  NUU:[id number] DOB: [birth year] DOA: 04/11/2022 PCP: Dettinger, Fransisca Kaufmann, MD    Brief Narrative:  81 year old with history of coronary artery disease, type 2 diabetes, hypertension, previous stroke with residual dysarthria and aphasia, COPD on 3 to 4 L of oxygen at home presented to the emergency room with chest pain and shortness of breath for 1 day.  On arrival to emergency room she was found with greatly elevated troponins.  Chest x-ray showed fluid overload.  Patient was started on heparin infusion, given IV Lasix and transferred to [facility] for further cardiology care.   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  Remains on heparin infusion.  Currently on nitroglycerin patch.  Patient is on aspirin, carvedilol, isosorbide mononitrate, rosuvastatin. Patient seen by cardiology and planning for left and right heart cath today. Received diuretics on admission, currently on hold for anticipated contrast load. Keep on oxygen to keep saturation more than 90%.  Type 2 diabetes, well controlled: On Trulicity and Lantus at home.  Currently on hold.  Keep on sliding scale insulin because he is n.p.o.  Essential hypertension: Blood pressures fairly controlled on nitrates and carvedilol.  COPD with nocturnal hypoxemia: Oxygen as above.  Fairly controlled symptoms on nebulizer therapy.  Previous stroke with aphasia: Repeat swallow evaluation today.  Hypokalemia: Replace.     DVT prophylaxis: Heparin infusion   Code Status: DNR Family Communication: None.  Cardiology trying to reach her husband for consent. Disposition Plan: Status is: Inpatient Remains inpatient appropriate because: Non-STEMI, heparin infusion, procedure planned     Consultants:  Cardiology  Procedures:   None  Antimicrobials:  None   Subjective: Patient seen and examined in the morning rounds.  It is difficult to understand due to her aphasia which is likely chronic.  She was able to say yes and no and follow commands.  Denied any chest pain or shortness of breath at rest.  Objective: Vitals:   04/11/22 1941 04/11/22 2311 04/12/22 0426 04/12/22 0818  BP: 126/68 (!) 140/72 (!) 97/53 (!) 117/58  Pulse: 87 73 65 67  Resp: '20 20 19 16  '$ Temp: 99.9 F (37.7 C) 98 F (36.7 C) 98.2 F (36.8 C) 97.7 F (36.5 C)  TempSrc: Axillary Axillary Oral Oral  SpO2: 97% 94% 96% 98%  Weight:   82.7 kg   Height:        Intake/Output Summary (Last 24 hours) at 04/12/2022 1032 Last data filed at 04/12/2022 3474 Gross per 24 hour  Intake 247.94 ml  Output 1625 ml  Net -1377.06 ml   Filed Weights   04/11/22 0247 04/12/22 0426  Weight: 86.2 kg 82.7 kg    Examination:  General exam: Appears calm and comfortable  Appropriately anxious. Alert and awake.  Dysarthric but able to answer simple questions. Respiratory system: Clear to auscultation. Respiratory effort normal. Cardiovascular system: S1 & S2 heard, RRR. No JVD, murmurs, rubs, gallops or clicks. No pedal edema. Gastrointestinal system: Abdomen is nondistended, soft and nontender. No organomegaly or masses felt. Normal bowel sounds heard. Central nervous system: Alert and oriented. No focal neurological deficits. Extremities: Symmetric 5 x 5 power.     Data Reviewed: I have personally reviewed following labs and imaging studies  CBC: Recent Labs  Lab 04/11/22 0245 04/12/22 0029  WBC 8.8 8.9  NEUTROABS 6.0  --  HGB 13.7 14.3  HCT 41.4 43.3  MCV 88.3 87.7  PLT 222 213   Basic Metabolic Panel: Recent Labs  Lab 04/11/22 0245 04/12/22 0029 04/12/22 0815  NA 140 140  --   K 3.7 3.1*  --   CL 108 100  --   CO2 24 26  --   GLUCOSE 125* 142*  --   BUN 21 14  --   CREATININE 0.80 1.04*  --   CALCIUM 8.5* 8.5*  --    MG  --   --  2.1   GFR: Estimated Creatinine Clearance: 42.8 mL/min (A) (by C-G formula based on SCr of 1.04 mg/dL (H)). Liver Function Tests: No results for input(s): "AST", "ALT", "ALKPHOS", "BILITOT", "PROT", "ALBUMIN" in the last 168 hours. No results for input(s): "LIPASE", "AMYLASE" in the last 168 hours. No results for input(s): "AMMONIA" in the last 168 hours. Coagulation Profile: No results for input(s): "INR", "PROTIME" in the last 168 hours. Cardiac Enzymes: No results for input(s): "CKTOTAL", "CKMB", "CKMBINDEX", "TROPONINI" in the last 168 hours. BNP (last 3 results) No results for input(s): "PROBNP" in the last 8760 hours. HbA1C: Recent Labs    04/11/22 0938  HGBA1C 6.6*   CBG: Recent Labs  Lab 04/11/22 1212 04/11/22 1710 04/11/22 2123 04/12/22 0626  GLUCAP 113* 176* 103* 101*   Lipid Profile: No results for input(s): "CHOL", "HDL", "LDLCALC", "TRIG", "CHOLHDL", "LDLDIRECT" in the last 72 hours. Thyroid Function Tests: No results for input(s): "TSH", "T4TOTAL", "FREET4", "T3FREE", "THYROIDAB" in the last 72 hours. Anemia Panel: No results for input(s): "VITAMINB12", "FOLATE", "FERRITIN", "TIBC", "IRON", "RETICCTPCT" in the last 72 hours. Sepsis Labs: No results for input(s): "PROCALCITON", "LATICACIDVEN" in the last 168 hours.  Recent Results (from the past 240 hour(s))  MRSA Next Gen by PCR, Nasal     Status: None   Collection Time: 04/11/22  7:34 PM   Specimen: Nasal Mucosa; Nasal Swab  Result Value Ref Range Status   MRSA by PCR Next Gen NOT DETECTED NOT DETECTED Final    Comment: (NOTE) The GeneXpert MRSA Assay (FDA approved for NASAL specimens only), is one component of a comprehensive MRSA colonization surveillance program. It is not intended to diagnose MRSA infection nor to guide or monitor treatment for MRSA infections. Test performance is not FDA approved in patients less than 12 years old. Performed at [facility], [place] [address], [place], [place] [address]          Radiology Studies: ECHOCARDIOGRAM COMPLETE  Result Date: 04/11/2022    ECHOCARDIOGRAM REPORT   Patient Name:   Susan Patel Date of Exam: 04/11/2022 Medical Rec #:  [id number]      Height:       61.0 in Accession #:    [id number]     Weight:       190.0 lb Date of Birth:  Oct 06, 1940      BSA:          1.848 m Patient Age:    60 years       BP:           159/85 mmHg Patient Gender: F              HR:           80 bpm. Exam Location:  [facility] Procedure: 2D Echo, Cardiac Doppler and Color Doppler Indications:    Elevated Troponin  History:        Patient has prior history of  Echocardiogram examinations, most                 recent 09/14/2021. CHF, Stroke and COPD; Risk                 Factors:Hypertension, Diabetes and Dyslipidemia.  Sonographer:    Alvino Chapel RCS Referring Phys: [place]  1. Left ventricular ejection fraction, by estimation, is 25 to 30%. The left ventricle has severely decreased function. The left ventricle demonstrates regional wall motion abnormalities (see scoring diagram/findings for description). The left ventricular internal cavity size was moderately dilated. There is moderate left ventricular hypertrophy. Left ventricular diastolic parameters are consistent with Grade I diastolic dysfunction (impaired relaxation). There is akinesis of the left ventricular, entire inferolateral wall. There is mild dyskinesis of the left ventricular, entire apical segment. There is severe hypokinesis of the left ventricular, inferior wall.  2. Right ventricular systolic function is normal. The right ventricular size is normal. Tricuspid regurgitation signal is inadequate for assessing PA pressure.  3. Left atrial size was mild to moderately dilated.  4. The mitral valve is normal in structure. Trivial mitral valve regurgitation.  5. The aortic valve is tricuspid. Aortic valve regurgitation is trivial. Aortic valve sclerosis is present,  with no evidence of aortic valve stenosis. Comparison(s): The left ventricular function is significantly worse. The left ventricular wall motion abnormalities are new. FINDINGS  Left Ventricle: No LV apical thrombus is seen (Definity contrast was not used). Left ventricular ejection fraction, by estimation, is 25 to 30%. The left ventricle has severely decreased function. The left ventricle demonstrates regional wall motion abnormalities. Mild dyskinesis of the left ventricular, entire apical segment. The left ventricular internal cavity size was moderately dilated. There is moderate left ventricular hypertrophy. Left ventricular diastolic parameters are consistent with Grade I diastolic dysfunction (impaired relaxation). Normal left ventricular filling pressure.  LV Wall Scoring: The entire apex is dyskinetic. The antero-lateral wall, posterior wall, and mid inferior segment are akinetic. The basal inferior segment is hypokinetic. The anterior wall, anterior septum, mid inferoseptal segment, and basal inferoseptal segment are normal. Right Ventricle: The right ventricular size is normal. No increase in right ventricular wall thickness. Right ventricular systolic function is normal. Tricuspid regurgitation signal is inadequate for assessing PA pressure. Left Atrium: Left atrial size was mild to moderately dilated. Right Atrium: Right atrial size was normal in size. Pericardium: There is no evidence of pericardial effusion. Mitral Valve: The mitral valve is normal in structure. Trivial mitral valve regurgitation, with centrally-directed jet. Tricuspid Valve: The tricuspid valve is normal in structure. Tricuspid valve regurgitation is trivial. Aortic Valve: The aortic valve is tricuspid. Aortic valve regurgitation is trivial. Aortic valve sclerosis is present, with no evidence of aortic valve stenosis. Pulmonic Valve: The pulmonic valve was grossly normal. Pulmonic valve regurgitation is mild. Aorta: The aortic root  is normal in size and structure. IAS/Shunts: No atrial level shunt detected by color flow Doppler.  LEFT VENTRICLE PLAX 2D LVIDd:         6.10 cm      Diastology LVIDs:         4.80 cm      LV e' medial:    3.37 cm/s LV PW:         1.10 cm      LV E/e' medial:  13.4 LV IVS:        1.70 cm      LV e' lateral:   6.00 cm/s LVOT diam:  1.60 cm      LV E/e' lateral: 7.6 LV SV:         24 LV SV Index:   13 LVOT Area:     2.01 cm  LV Volumes (MOD) LV vol d, MOD A2C: 134.0 ml LV vol d, MOD A4C: 152.0 ml LV vol s, MOD A2C: 80.9 ml LV vol s, MOD A4C: 110.0 ml LV SV MOD A2C:     53.1 ml LV SV MOD A4C:     152.0 ml LV SV MOD BP:      55.5 ml RIGHT VENTRICLE RV S prime:     10.60 cm/s TAPSE (M-mode): 2.1 cm LEFT ATRIUM             Index        RIGHT ATRIUM           Index LA diam:        3.90 cm 2.11 cm/m   RA Area:     10.30 cm LA Vol (A2C):   51.8 ml 28.03 ml/m  RA Volume:   23.70 ml  12.82 ml/m LA Vol (A4C):   67.7 ml 36.63 ml/m LA Biplane Vol: 60.6 ml 32.79 ml/m  AORTIC VALVE LVOT Vmax:   61.40 cm/s LVOT Vmean:  46.400 cm/s LVOT VTI:    0.119 m  AORTA Ao Root diam: 2.80 cm MITRAL VALVE MV Area (PHT): 6.17 cm    SHUNTS MV Decel Time: 123 msec    Systemic VTI:  0.12 m MR Peak grad: 65.0 mmHg    Systemic Diam: 1.60 cm MR Mean grad: 48.0 mmHg MR Vmax:      403.00 cm/s MR Vmean:     332.0 cm/s MV E velocity: 45.30 cm/s MV A velocity: 87.10 cm/s MV E/A ratio:  0.52 Mihai Croitoru MD Electronically signed by Sanda Klein MD Signature Date/Time: 04/11/2022/12:57:21 PM    Final    DG Chest Port 1 View  Result Date: 04/11/2022 CLINICAL DATA:  Shortness of breath and chest pain EXAM: PORTABLE CHEST 1 VIEW COMPARISON:  05/13/2020 FINDINGS: Cardiac shadow is stable. Vascular congestion is noted centrally with mild edema consistent with CHF. No focal confluent infiltrate is seen. No effusion is noted. No acute bony abnormality is seen. IMPRESSION: Mild CHF. Electronically Signed   By: Inez Catalina M.D.   On: 04/11/2022  03:21        Scheduled Meds:  aspirin EC  81 mg Oral Daily   carvedilol  6.25 mg Oral BID   insulin aspart  0-5 Units Subcutaneous QHS   insulin aspart  0-9 Units Subcutaneous TID WC   ipratropium-albuterol  3 mL Nebulization TID   isosorbide mononitrate  30 mg Oral Daily   montelukast  10 mg Oral Daily   pantoprazole  40 mg Oral QAC breakfast   rosuvastatin  40 mg Oral Daily   Continuous Infusions:  heparin 1,000 Units/hr (04/12/22 0553)     LOS: 1 day    Time spent: 35 minutes    Barb Merino, MD [facility] Pager [phone]

## 2022-04-12 NOTE — Interval H&P Note (Signed)
History and Physical Interval Note:  04/12/2022 5:42 PM  Susan Patel  has presented today for surgery, with the diagnosis of acute HFrEF and NSTEMI.  The various methods of treatment have been discussed with the patient and family. After consideration of risks, benefits and other options for treatment, the patient has consented to  Procedure(s): RIGHT/LEFT HEART CATH AND CORONARY ANGIOGRAPHY (N/A) as a surgical intervention.  The patient's history has been reviewed, patient examined, no change in status, stable for surgery.  I have reviewed the patient's chart and labs.  Questions were answered to the patient's satisfaction.    Cath Lab Visit (complete for each Cath Lab visit)  Clinical Evaluation Leading to the Procedure:   ACS: Yes.    Non-ACS:  N/A  Tem[facility] Sporer

## 2022-04-12 NOTE — TOC Benefit Eligibility Note (Signed)
Patient Teacher, English as a foreign language completed.    The patient is currently admitted and upon discharge could be taking Entresto 24-26 mg.  The current 30 day co-pay is $37.00.   The patient is currently admitted and upon discharge could be taking Farxiga 10 mg.  The current 30 day co-pay is $37.00.   The patient is currently admitted and upon discharge could be taking Jardiance 10 mg.  The current 30 day co-pay is $37.00.   The patient is insured through [place], [place] Patient Advocate Specialist [place] Patient Advocate Team Direct Number: [phone]  Fax: [phone]

## 2022-04-13 ENCOUNTER — Encounter (HOSPITAL_COMMUNITY): Payer: Self-pay | Admitting: Internal Medicine

## 2022-04-13 ENCOUNTER — Inpatient Hospital Stay (HOSPITAL_COMMUNITY): Payer: Medicare Other

## 2022-04-13 DIAGNOSIS — I214 Non-ST elevation (NSTEMI) myocardial infarction: Secondary | ICD-10-CM | POA: Diagnosis not present

## 2022-04-13 LAB — POCT I-STAT 7, (LYTES, BLD GAS, ICA,H+H)
Acid-Base Excess: 4 mmol/L — ABNORMAL HIGH (ref 0.0–2.0)
Bicarbonate: 27.6 mmol/L (ref 20.0–28.0)
Calcium, Ion: 1.15 mmol/L (ref 1.15–1.40)
HCT: 41 % (ref 36.0–46.0)
Hemoglobin: 13.9 g/dL (ref 12.0–15.0)
O2 Saturation: 90 %
Potassium: 3.4 mmol/L — ABNORMAL LOW (ref 3.5–5.1)
Sodium: 140 mmol/L (ref 135–145)
TCO2: 29 mmol/L (ref 22–32)
pCO2 arterial: 38.9 mmHg (ref 32–48)
pH, Arterial: 7.46 — ABNORMAL HIGH (ref 7.35–7.45)
pO2, Arterial: 55 mmHg — ABNORMAL LOW (ref 83–108)

## 2022-04-13 LAB — GLUCOSE, CAPILLARY
Glucose-Capillary: 90 mg/dL (ref 70–99)
Glucose-Capillary: 94 mg/dL (ref 70–99)
Glucose-Capillary: 107 mg/dL — ABNORMAL HIGH (ref 70–99)
Glucose-Capillary: 104 mg/dL — ABNORMAL HIGH (ref 70–99)

## 2022-04-13 LAB — CBC
HCT: 40.6 % (ref 36.0–46.0)
Hemoglobin: 13.3 g/dL (ref 12.0–15.0)
MCH: 28.9 pg (ref 26.0–34.0)
MCHC: 32.8 g/dL (ref 30.0–36.0)
MCV: 88.3 fL (ref 80.0–100.0)
Platelets: 198 10*3/uL (ref 150–400)
RBC: 4.6 MIL/uL (ref 3.87–5.11)
RDW: 13.7 % (ref 11.5–15.5)
WBC: 7.9 10*3/uL (ref 4.0–10.5)
nRBC: 0 % (ref 0.0–0.2)

## 2022-04-13 LAB — BASIC METABOLIC PANEL
Anion gap: 11 (ref 5–15)
BUN: 23 mg/dL (ref 8–23)
CO2: 26 mmol/L (ref 22–32)
Calcium: 8.7 mg/dL — ABNORMAL LOW (ref 8.9–10.3)
Chloride: 102 mmol/L (ref 98–111)
Creatinine, Ser: 1.13 mg/dL — ABNORMAL HIGH (ref 0.44–1.00)
GFR, Estimated: 49 mL/min — ABNORMAL LOW (ref >60)
Glucose, Bld: 109 mg/dL — ABNORMAL HIGH (ref 70–99)
Potassium: 3.4 mmol/L — ABNORMAL LOW (ref 3.5–5.1)
Sodium: 139 mmol/L (ref 135–145)

## 2022-04-13 LAB — MAGNESIUM: Magnesium: 2 mg/dL (ref 1.7–2.4)

## 2022-04-13 MED ORDER — IPRATROPIUM-ALBUTEROL 0.5-2.5 (3) MG/3ML IN SOLN
3.0000 mL | Freq: Two times a day (BID) | RESPIRATORY_TRACT | Status: DC
  Administered 2022-04-13: 3 mL via RESPIRATORY_TRACT
  Filled 2022-04-13 (×2): qty 3

## 2022-04-13 MED ORDER — POTASSIUM CHLORIDE CRYS ER 20 MEQ PO TBCR
40.0000 meq | EXTENDED_RELEASE_TABLET | Freq: Two times a day (BID) | ORAL | Status: AC
  Administered 2022-04-13: 40 meq via ORAL
  Filled 2022-04-13: qty 2

## 2022-04-13 NOTE — Progress Notes (Signed)
Modified Barium Swallow Progress Note  Patient Details  Name: Susan Patel MRN: [id number] Date of Birth: 09-01-1940  Today's Date: 04/13/2022  Modified Barium Swallow completed.  Full report located under Chart Review in the Imaging Section.  Brief recommendations include the following:  Clinical Impression  Patient exhibits a moderately impaired oral phase of swallow and a mild-moderately impaired pharyngeal phase of swallow. During oral phase, patient with decreased anterior to posterior transit of liquids and solids, prolonged mastication and holding of puree solids as well as thin and nectar thick liquids. During mastication of mechanical soft solids, patient with premature spillage of tail of bolus into vallecular sinus while patient still masticating the rest of bolus. No oral residuals observed post swallows. During pharyngeal phase of swallow, patient with swallow inititiation delays at level of pyriform sinus with thin liquids via cup sips and nectar thick liquids via straw sips and delays at level of vallecular sinus with nectar thick liquids via cup sips and thin liquids via spoon sips. Patient exhibited flash penetration with cup and straw sips of nectar thick liquids(PAS 2) with penetration events being shallow in depth and fully exiting laryngeal vestibule. Silent aspiration occured on approximately 75% of swallows with thin liquids via cup sips (even when small, controlled sips). Aspiration events occured during the swallow and were trace in amount but patient without cough response or indication that she was sensate to aspirate. Puree solids, mechanical soft solids transited pharyngeally without difficulty and without residuals; trace vallecular and pyriform residuals observed with nectar thick liquids. SLP is recommending to initiate PO diet of Dys 2 (minced) solids and nectar thick liquids and will follow patient for toleration and ability to advance.   Swallow Evaluation  Recommendations       SLP Diet Recommendations: Dysphagia 2 (Fine chop) solids;Nectar thick liquid   Liquid Administration via: Cup;Straw   Medication Administration: Whole meds with puree   Supervision: Full supervision/cueing for compensatory strategies;Patient able to self feed;Staff to assist with self feeding   Compensations: Slow rate;Small sips/bites   Postural Changes: Seated upright at 90 degrees   Oral Care Recommendations: Oral care BID      Sonia Baller, MA, CCC-SLP Speech Therapy

## 2022-04-13 NOTE — TOC Initial Note (Addendum)
Transition of Care [facility]) - Initial/Assessment Note    Patient Details  Name: Susan Patel MRN: [id number] Date of Birth: 1941-05-10  [facility]) CM/SW Contact:    Marilu Favre, RN Phone Number: 04/13/2022, 11:04 AM  Clinical Narrative:                 Spoke to patient's husband via phone.   Patient's address: [place] , [name].   Discussed PT/OT recommendations for home health PT/OT . Husband in agreement . No preference Claiborne Billings with [facility] accepted referral.   Since speaking with husband patient failed swallow study . [facility] updated. [facility] can provide PT/OT/ST. Will need orders   TOC will continue to follow   Expected Discharge Plan: [place] Barriers to Discharge: Continued Medical Work up   Patient Goals and CMS Choice Patient states their goals for this hospitalization and ongoing recovery are:: to return to home CMS Medicare.gov Compare Post Acute Care list provided to:: Patient Represenative (must comment) (husband) Choice offered to / list presented to : Spouse  Expected Discharge Plan and Services Expected Discharge Plan: [place]   Discharge Planning Services: CM Consult Post Acute Care Choice: [place] arrangements for the past 2 months: Single Family Home                 DME Arranged: N/A DME Agency: NA       HH Arranged: PT, OT HH Agency: [facility] Date HH Agency Contacted: 04/13/22 Time HH Agency Contacted: 1104 Representative spoke with at [place]: Claiborne Billings  Prior Living Arrangements/Services Living arrangements for the past 2 months: [place] with:: Spouse Patient language and need for interpreter reviewed:: Yes            Current home services: DME    Activities of Daily Living      Permission Sought/Granted                  Emotional Assessment              Admission diagnosis:  Acute on chronic diastolic  heart failure (HCC) [I50.33] Elevated troponin [R79.89] NSTEMI (non-ST elevated myocardial infarction) ([place]) [I21.4] Elevated random blood glucose level [R73.09] Patient Active Problem List   Diagnosis Date Noted   Acute HFrEF (heart failure with reduced ejection fraction) ([place]) 04/12/2022   NSTEMI (non-ST elevated myocardial infarction) ([place]) 04/11/2022   Upper airway cough syndrome 05/13/2020   Nocturnal hypoxemia 32/35/5732   Chronic systolic CHF (congestive heart failure) ([place]) 08/21/2018   Cerebrovascular disease 08/21/2018   Pulmonary nodules 06/07/2018   Coronary artery disease    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   History of stroke 04/04/2017   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   Contusion of left elbow 03/15/2017   Dysmetria    Supplemental oxygen dependent    Hypoalbuminemia due to protein-calorie malnutrition ([place])    Oropharyngeal dysphagia    Cognitive deficit due to recent stroke 04/23/2016   Frequent falls 04/21/2016   Type 2 diabetes mellitus with peripheral neuropathy (HCC)    Fibromyalgia    Chronic pain syndrome    Chronic obstructive pulmonary disease (HCC)    Gait disturbance, post-stroke    Dysarthria, post-stroke    Dysphagia, post-stroke    Mixed hyperlipidemia    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   Allergic rhinitis 12/17/2015   Primary osteoarthritis of both knees 11/19/2015   Obesity (BMI 30-39.9)  09/04/2015   Essential hypertension 09/04/2015   Constipation 09/04/2015   Raynaud's phenomenon 08/26/2015   Gastro-esophageal reflux disease with esophagitis 08/26/2015   Spinal stenosis 08/26/2015   Vitamin D deficiency 08/26/2015   DDD (degenerative disc disease), cervical 07/10/2015   Cervical radicular pain 04/30/2013   PCP:  Dettinger, Fransisca Kaufmann, MD Pharmacy:   [id number] [place] [place] [place]Phone: [phone]: [phone] [place] [address]: [phone]: [address] [place] [address]Phone: [phone]: [phone] [facility] [address]Phone: [phone]: [phone]    Social Determinants of Health (SDOH) Interventions    Readmission Risk Interventions     No data to display

## 2022-04-13 NOTE — Progress Notes (Signed)
Rounding Note    Patient Name: Susan Patel Date of Encounter: 04/13/2022  [facility] Cardiologist: Fransico Him, MD   Subjective   Failed swallow study. No CP or SOB. Very dry mouth.  Inpatient Medications    Scheduled Meds:  aspirin EC  81 mg Oral Daily   clopidogrel  75 mg Oral Q breakfast   enoxaparin (LOVENOX) injection  40 mg Subcutaneous Q24H   insulin aspart  0-5 Units Subcutaneous QHS   insulin aspart  0-9 Units Subcutaneous TID WC   ipratropium-albuterol  3 mL Nebulization TID   isosorbide mononitrate  30 mg Oral Daily   metoprolol tartrate  5 mg Intravenous Q12H   montelukast  10 mg Oral Daily   pantoprazole (PROTONIX) IV  40 mg Intravenous Q24H   rosuvastatin  40 mg Oral Daily   sodium chloride flush  3 mL Intravenous Q12H   Continuous Infusions:  sodium chloride     PRN Meds: sodium chloride, acetaminophen **OR** acetaminophen, albuterol, nitroGLYCERIN, ondansetron **OR** ondansetron (ZOFRAN) IV, sodium chloride flush, traZODone   Vital Signs    Vitals:   04/12/22 2300 04/13/22 0019 04/13/22 0300 04/13/22 0733  BP: (!) 142/63  115/61 (!) 154/68  Pulse: 82  92 80  Resp: '20 20 15 16  '$ Temp: 97.9 F (36.6 C)  97.6 F (36.4 C) (!) 97.5 F (36.4 C)  TempSrc: Oral  Oral Oral  SpO2: 93% 93% 96% 97%  Weight:   82 kg   Height:        Intake/Output Summary (Last 24 hours) at 04/13/2022 0839 Last data filed at 04/13/2022 0657 Gross per 24 hour  Intake 111.38 ml  Output 250 ml  Net -138.62 ml      04/13/2022    3:00 AM 04/12/2022    4:26 AM 04/11/2022    2:47 AM  Last 3 Weights  Weight (lbs) 180 lb 12.4 oz 182 lb 5.1 oz 190 lb 0.6 oz  Weight (kg) 82 kg 82.7 kg 86.2 kg      Telemetry    Sinus rhythm with rates mostly in the 60s to 70s. PVCs noted. - Personally Reviewed  ECG    No new ECG tracing today. - Personally Reviewed  Physical Exam   GEN: Elderly Caucasian female resting comfortably in no acute distress.  On 4L of  O2 via nasal cannula. Neck: No significant JVD noted. Cardiac: RRR with occasional ectopy. No murmurs, rubs, or gallops.  Respiratory: Clear to auscultation anteriorly with no wheezes, rhonchi, or rales noted. GI: Soft and non-distended. MS: No lower extremity edema; No deformity. Skin: Warm and dry. Neuro:  Very sleepy. Follows commands. Psych: Very sleepy.  Labs    High Sensitivity Troponin:   Recent Labs  Lab 04/11/22 0245 04/11/22 0558 04/12/22 0553  TROPONINIHS 350* 1,394* 6,954*     Chemistry Recent Labs  Lab 04/11/22 0245 04/12/22 0029 04/12/22 0815 04/12/22 1815 04/13/22 0028  NA 140 140  --  141 139  K 3.7 3.1*  --  3.4* 3.4*  CL 108 100  --   --  102  CO2 24 26  --   --  26  GLUCOSE 125* 142*  --   --  109*  BUN 21 14  --   --  23  CREATININE 0.80 1.04*  --   --  1.13*  CALCIUM 8.5* 8.5*  --   --  8.7*  MG  --   --  2.1  --  2.0  GFRNONAA >60 55*  --   --  49*  ANIONGAP 8 14  --   --  11    Lipids No results for input(s): "CHOL", "TRIG", "HDL", "LABVLDL", "LDLCALC", "CHOLHDL" in the last 168 hours.  Hematology Recent Labs  Lab 04/11/22 0245 04/12/22 0029 04/12/22 1815 04/13/22 0028  WBC 8.8 8.9  --  7.9  RBC 4.69 4.94  --  4.60  HGB 13.7 14.3 13.9 13.3  HCT 41.4 43.3 41.0 40.6  MCV 88.3 87.7  --  88.3  MCH 29.2 28.9  --  28.9  MCHC 33.1 33.0  --  32.8  RDW 13.9 13.8  --  13.7  PLT 222 219  --  198   Thyroid No results for input(s): "TSH", "FREET4" in the last 168 hours.  BNP Recent Labs  Lab 04/11/22 0245 04/12/22 0029  BNP 563.0* 1,313.1*    DDimer No results for input(s): "DDIMER" in the last 168 hours.   Radiology    CARDIAC CATHETERIZATION  Result Date: 04/12/2022 Conclusions: Significant multivessel coronary artery disease, as detailed below.  Chronic total occlusion of the RCA now extends from the ostium to the mid vessel; otherwise, there has been no significant change since prior catheterization in 2020. Normal left and right  heart filling pressures. Mildly reduced Fick cardiac output/index. Recommendations: Given minimal progression of coronary artery disease since 2020, recommend continued medical therapy as the patient is a poor candidate for CABG.  PCI could be considered if she were to have refractory angina/heart failure symptoms, though diffuse nature of the LAD disease as well as LMCA bifurcation involvement would make the procedure very high risk. Aggressive secondary prevention of coronary artery disease.  Consider dual antiplatelet therapy with aspirin and clopidogrel for up to 12 months, as tolerated. Maintain net even fluid balance with escalation of goal-directed medical therapy for HFrEF, as blood pressure and renal function allow. Nelva Bush, MD [facility]  ECHOCARDIOGRAM COMPLETE  Result Date: 04/11/2022    ECHOCARDIOGRAM REPORT   Patient Name:   Susan Patel Date of Exam: 04/11/2022 Medical Rec #:  [id number]      Height:       61.0 in Accession #:    [id number]     Weight:       190.0 lb Date of Birth:  10-Jul-1940      BSA:          1.848 m Patient Age:    81 years       BP:           159/85 mmHg Patient Gender: F              HR:           80 bpm. Exam Location:  [facility] Procedure: 2D Echo, Cardiac Doppler and Color Doppler Indications:    Elevated Troponin  History:        Patient has prior history of Echocardiogram examinations, most                 recent 09/14/2021. CHF, Stroke and COPD; Risk                 Factors:Hypertension, Diabetes and Dyslipidemia.  Sonographer:    Alvino Chapel RCS Referring Phys: [place]  1. Left ventricular ejection fraction, by estimation, is 25 to 30%. The left ventricle has severely decreased function. The left ventricle demonstrates regional wall motion abnormalities (see scoring diagram/findings for description). The left ventricular internal  cavity size was moderately dilated. There is moderate left ventricular hypertrophy. Left ventricular  diastolic parameters are consistent with Grade I diastolic dysfunction (impaired relaxation). There is akinesis of the left ventricular, entire inferolateral wall. There is mild dyskinesis of the left ventricular, entire apical segment. There is severe hypokinesis of the left ventricular, inferior wall.  2. Right ventricular systolic function is normal. The right ventricular size is normal. Tricuspid regurgitation signal is inadequate for assessing PA pressure.  3. Left atrial size was mild to moderately dilated.  4. The mitral valve is normal in structure. Trivial mitral valve regurgitation.  5. The aortic valve is tricuspid. Aortic valve regurgitation is trivial. Aortic valve sclerosis is present, with no evidence of aortic valve stenosis. Comparison(s): The left ventricular function is significantly worse. The left ventricular wall motion abnormalities are new. FINDINGS  Left Ventricle: No LV apical thrombus is seen (Definity contrast was not used). Left ventricular ejection fraction, by estimation, is 25 to 30%. The left ventricle has severely decreased function. The left ventricle demonstrates regional wall motion abnormalities. Mild dyskinesis of the left ventricular, entire apical segment. The left ventricular internal cavity size was moderately dilated. There is moderate left ventricular hypertrophy. Left ventricular diastolic parameters are consistent with Grade I diastolic dysfunction (impaired relaxation). Normal left ventricular filling pressure.  LV Wall Scoring: The entire apex is dyskinetic. The antero-lateral wall, posterior wall, and mid inferior segment are akinetic. The basal inferior segment is hypokinetic. The anterior wall, anterior septum, mid inferoseptal segment, and basal inferoseptal segment are normal. Right Ventricle: The right ventricular size is normal. No increase in right ventricular wall thickness. Right ventricular systolic function is normal. Tricuspid regurgitation signal is  inadequate for assessing PA pressure. Left Atrium: Left atrial size was mild to moderately dilated. Right Atrium: Right atrial size was normal in size. Pericardium: There is no evidence of pericardial effusion. Mitral Valve: The mitral valve is normal in structure. Trivial mitral valve regurgitation, with centrally-directed jet. Tricuspid Valve: The tricuspid valve is normal in structure. Tricuspid valve regurgitation is trivial. Aortic Valve: The aortic valve is tricuspid. Aortic valve regurgitation is trivial. Aortic valve sclerosis is present, with no evidence of aortic valve stenosis. Pulmonic Valve: The pulmonic valve was grossly normal. Pulmonic valve regurgitation is mild. Aorta: The aortic root is normal in size and structure. IAS/Shunts: No atrial level shunt detected by color flow Doppler.  LEFT VENTRICLE PLAX 2D LVIDd:         6.10 cm      Diastology LVIDs:         4.80 cm      LV e' medial:    3.37 cm/s LV PW:         1.10 cm      LV E/e' medial:  13.4 LV IVS:        1.70 cm      LV e' lateral:   6.00 cm/s LVOT diam:     1.60 cm      LV E/e' lateral: 7.6 LV SV:         24 LV SV Index:   13 LVOT Area:     2.01 cm  LV Volumes (MOD) LV vol d, MOD A2C: 134.0 ml LV vol d, MOD A4C: 152.0 ml LV vol s, MOD A2C: 80.9 ml LV vol s, MOD A4C: 110.0 ml LV SV MOD A2C:     53.1 ml LV SV MOD A4C:     152.0 ml LV SV MOD BP:  55.5 ml RIGHT VENTRICLE RV S prime:     10.60 cm/s TAPSE (M-mode): 2.1 cm LEFT ATRIUM             Index        RIGHT ATRIUM           Index LA diam:        3.90 cm 2.11 cm/m   RA Area:     10.30 cm LA Vol (A2C):   51.8 ml 28.03 ml/m  RA Volume:   23.70 ml  12.82 ml/m LA Vol (A4C):   67.7 ml 36.63 ml/m LA Biplane Vol: 60.6 ml 32.79 ml/m  AORTIC VALVE LVOT Vmax:   61.40 cm/s LVOT Vmean:  46.400 cm/s LVOT VTI:    0.119 m  AORTA Ao Root diam: 2.80 cm MITRAL VALVE MV Area (PHT): 6.17 cm    SHUNTS MV Decel Time: 123 msec    Systemic VTI:  0.12 m MR Peak grad: 65.0 mmHg    Systemic Diam: 1.60 cm  MR Mean grad: 48.0 mmHg MR Vmax:      403.00 cm/s MR Vmean:     332.0 cm/s MV E velocity: 45.30 cm/s MV A velocity: 87.10 cm/s MV E/A ratio:  0.52 Mihai Croitoru MD Electronically signed by Sanda Klein MD Signature Date/Time: 04/11/2022/12:57:21 PM    Final     Cardiac Studies   Echocardiogram 04/11/2022: Impressions:  1. Left ventricular ejection fraction, by estimation, is 25 to 30%. The  left ventricle has severely decreased function. The left ventricle  demonstrates regional wall motion abnormalities (see scoring  diagram/findings for description). The left  ventricular internal cavity size was moderately dilated. There is moderate  left ventricular hypertrophy. Left ventricular diastolic parameters are  consistent with Grade I diastolic dysfunction (impaired relaxation). There  is akinesis of the left  ventricular, entire inferolateral wall. There is mild dyskinesis of the  left ventricular, entire apical segment. There is severe hypokinesis of  the left ventricular, inferior wall.   2. Right ventricular systolic function is normal. The right ventricular  size is normal. Tricuspid regurgitation signal is inadequate for assessing  PA pressure.   3. Left atrial size was mild to moderately dilated.   4. The mitral valve is normal in structure. Trivial mitral valve  regurgitation.   5. The aortic valve is tricuspid. Aortic valve regurgitation is trivial.  Aortic valve sclerosis is present, with no evidence of aortic valve  stenosis.   Comparison(s): The left ventricular function is significantly worse. The  left ventricular wall motion abnormalities are new.   Patient Profile     81 y.o. female with a history of  multivessel CAD noted on cardiac catheterization in 05/2018 (not felt to be a candidate for CABG and treated medically), hypertension, hyperlipidemia, type 2 diabetes mellitus, CVA x2 with residual dysarthria, COPD on O2 at night, GERD, fibromyalgia, and chronic pain who  was admitted on 04/11/2022 for NSTEMI  and acute CHF after presenting with chest pain and shortness of breath. Initial noted to be hypoxic and required non-rebreather; however, respiratory status improved after diuresis.  Assessment & Plan    NSTEMI CAD Patient has known severe multivessel disease. LHC in 05/2018 showed 60% stenosis of distal left main, 30% stenosis of ostial to proximal LAD followed by another 30% stenosis of proximal to mid LAD and 70% stenosis of mid LAD, 100% stenosis of proximal to mid LAD, 50% stenosis of ostial to proximal LCX followed by 20% stenosis of proximal to mid  LCX, and 50% stenosis of ostial 1st Diag. She was seen by CT surgery at that time and not felt to be a candidate for surgery given underlying medical conditions; therefore, she was treated medically. Now presents with chest pain and shortness of breath. EKG showed normal sinus rhythm with LBBB. High-sensitivity troponin 350 >> 1,394. Echo shows newly reduced EF of 25-30% with multiple wall motion abnormalities. Cath showed stable multivessel CAD, recommendation for continued medication management.  - Continue Coreg 6.'25mg'$  twice daily and Imdur '30mg'$  daily. - Continue Aspirin, Plavix and Crestor. DAPT to be continued for 1 year if tolerated and no bleeding. Recommend protonix as well.   New Onset Acute Systolic CHF Patient presented with chest pain and shortness of breath. She was initial hypoxic and required a non-rebreather. BNP elevated at 563 >> 1,313. Chest x-ray showed vascular congestion noted centrally with mild edema consistent with CHF. Echo showed LVEF of 25-30% with akinesis of the entire inferolateral wall and mild dyskinesis of the entire apical segment as well as severe hypokinesis of the inferior wall. She was started on IV Lasix with improvement in respiratory status.  - Does not appear significantly volume overloaded. - Will hold additional IV Lasix for now given bump in creatinine. - Continue  Coreg 6.'25mg'$  twice daily and Imdur '30mg'$  daily. - Will try to add additional GDMT as BP and renal function allow. Heart Failure Therapy ACE-I/ARB/ARNI: none currently, consider adding as renal function trends to normal.  BB: coreg 6.25 mg BID MRA: none currently SGLT2I: none Diuretic plan: hold lasix today. D/w HF PharmD - when able to swallow pills, can consider addition of low dose ARB and low dose MRA.   Hypertension - Continue Coreg and Imdur as above.  Hyperlipidemia Lipid panel in 02/2022: Total Cholesterol 152, Trigylcerides 103, HDL 47, LDL 86. LDL goal <55. - Will increase home Crestor from '20mg'$  to '40mg'$  daily. - Will need repeat lipid panel and LFTs in 6-8 weeks.  Hypokalemia Potassium 3.4 this morning - Will replete 40 meq x 2 doses.   Otherwise, per primary team: - Type 2 diabetes mellitus - GERD - Fibromyalgia - Chronic pain - History of CVA with aphasia  For questions or updates, please contact [place] Please consult [email or web address] for contact info under   Elouise Munroe, MD 8:40 AM 04/13/22

## 2022-04-13 NOTE — Progress Notes (Signed)
PROGRESS NOTE    Susan Patel  GLO:[id number] DOB: Sep 18, 1940 DOA: 04/11/2022 PCP: Dettinger, Fransisca Kaufmann, MD    Brief Narrative:  81 year old with history of coronary artery disease, type 2 diabetes, hypertension, previous stroke with residual dysarthria and aphasia, COPD on 3 to 4 L of oxygen at home presented to the emergency room with chest pain and shortness of breath for 1 day.  On arrival to emergency room she was found with greatly elevated troponins.  Chest x-ray showed fluid overload.  Patient was started on heparin infusion, given IV Lasix and transferred to [facility] for further cardiology care. 11/27, underwent left heart cath and right heart cath and found to have extensive multivessel disease.  Ejection fraction 25 to 30%.  Conservative management advised.   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  Treated with heparin and nitroglycerin.  Underwent cardiac cath with findings above.  She is not a CABG candidate due to severe frailty and debility.  Aspirin, Plavix, Crestor, Coreg and Imdur.  Keep on oxygen to keep saturation more than 90%.  Mobilize.  Type 2 diabetes, well controlled: On Trulicity and Lantus at home.  On sliding scale insulin.  Fairly stable.  Essential hypertension: Blood pressures fairly controlled on nitrates and carvedilol.  COPD with nocturnal hypoxemia: Oxygen as above.  Fairly controlled symptoms on nebulizer therapy.  Previous stroke with aphasia: Repeat swallow evaluation today. Patient has significant aphasia and was unable to swallow pills.  Speech therapy to see.  Hypokalemia: Replaced.     DVT prophylaxis: enoxaparin (LOVENOX) injection 40 mg Start: 04/13/22 0800Heparin infusion   Code Status: DNR Family Communication: None. Disposition Plan: Status is:  Inpatient Remains inpatient appropriate because: Non-STEMI, unable to swallow.     Consultants:  Cardiology  Procedures:  None  Antimicrobials:  None   Subjective: Patient seen and examined.  She was crying with dry mouth and wanting to drink water.  She had to be seen by speech therapy.  Denies any chest pain or shortness of breath.  No family at bedside.  Objective: Vitals:   04/13/22 0019 04/13/22 0300 04/13/22 0733 04/13/22 0846  BP:  115/61 (!) 154/68   Pulse:  92 80 75  Resp: '20 15 16 16  '$ Temp:  97.6 F (36.4 C) (!) 97.5 F (36.4 C)   TempSrc:  Oral Oral   SpO2: 93% 96% 97% 98%  Weight:  82 kg    Height:        Intake/Output Summary (Last 24 hours) at 04/13/2022 1059 Last data filed at 04/13/2022 0657 Gross per 24 hour  Intake 111.38 ml  Output 250 ml  Net -138.62 ml    Filed Weights   04/11/22 0247 04/12/22 0426 04/13/22 0300  Weight: 86.2 kg 82.7 kg 82 kg    Examination:  General exam: Appears calm and comfortable .  Uncomfortable with dry mouth today. Appropriately anxious. Alert and awake.  Dysarthric but able to answer simple questions. Respiratory system: Clear to auscultation. Respiratory effort normal. Cardiovascular system: S1 & S2 heard, RRR. No JVD, murmurs, rubs, gallops or clicks. No pedal edema. Gastrointestinal system: Abdomen is nondistended, soft and nontender. No organomegaly or masses felt. Normal bowel sounds heard. Central nervous system: Dysarthric.  Difficult to understand words.  Alert and oriented. No focal neurological deficits. Extremities: Symmetric 5 x 5 power.  Data Reviewed: I have personally reviewed following labs and imaging studies  CBC: Recent Labs  Lab 04/11/22 0245 04/12/22 0029 04/12/22 1815 04/13/22 0028  WBC 8.8 8.9  --  7.9  NEUTROABS 6.0  --   --   --   HGB 13.7 14.3 13.9 13.3  HCT 41.4 43.3 41.0 40.6  MCV 88.3 87.7  --  88.3  PLT 222 219  --  326    Basic Metabolic Panel: Recent Labs   Lab 04/11/22 0245 04/12/22 0029 04/12/22 0815 04/12/22 1815 04/13/22 0028  NA 140 140  --  141 139  K 3.7 3.1*  --  3.4* 3.4*  CL 108 100  --   --  102  CO2 24 26  --   --  26  GLUCOSE 125* 142*  --   --  109*  BUN 21 14  --   --  23  CREATININE 0.80 1.04*  --   --  1.13*  CALCIUM 8.5* 8.5*  --   --  8.7*  MG  --   --  2.1  --  2.0    GFR: Estimated Creatinine Clearance: 39.2 mL/min (A) (by C-G formula based on SCr of 1.13 mg/dL (H)). Liver Function Tests: No results for input(s): "AST", "ALT", "ALKPHOS", "BILITOT", "PROT", "ALBUMIN" in the last 168 hours. No results for input(s): "LIPASE", "AMYLASE" in the last 168 hours. No results for input(s): "AMMONIA" in the last 168 hours. Coagulation Profile: No results for input(s): "INR", "PROTIME" in the last 168 hours. Cardiac Enzymes: No results for input(s): "CKTOTAL", "CKMB", "CKMBINDEX", "TROPONINI" in the last 168 hours. BNP (last 3 results) No results for input(s): "PROBNP" in the last 8760 hours. HbA1C: Recent Labs    04/11/22 0938  HGBA1C 6.6*    CBG: Recent Labs  Lab 04/12/22 0626 04/12/22 1240 04/12/22 1702 04/12/22 2105 04/13/22 0613  GLUCAP 101* 105* 97 91 90    Lipid Profile: No results for input(s): "CHOL", "HDL", "LDLCALC", "TRIG", "CHOLHDL", "LDLDIRECT" in the last 72 hours. Thyroid Function Tests: No results for input(s): "TSH", "T4TOTAL", "FREET4", "T3FREE", "THYROIDAB" in the last 72 hours. Anemia Panel: No results for input(s): "VITAMINB12", "FOLATE", "FERRITIN", "TIBC", "IRON", "RETICCTPCT" in the last 72 hours. Sepsis Labs: No results for input(s): "PROCALCITON", "LATICACIDVEN" in the last 168 hours.  Recent Results (from the past 240 hour(s))  MRSA Next Gen by PCR, Nasal     Status: None   Collection Time: 04/11/22  7:34 PM   Specimen: Nasal Mucosa; Nasal Swab  Result Value Ref Range Status   MRSA by PCR Next Gen NOT DETECTED NOT DETECTED Final    Comment: (NOTE) The GeneXpert MRSA  Assay (FDA approved for NASAL specimens only), is one component of a comprehensive MRSA colonization surveillance program. It is not intended to diagnose MRSA infection nor to guide or monitor treatment for MRSA infections. Test performance is not FDA approved in patients less than 53 years old. Performed at [facility], [place] [address], [place], [place] [address]          Radiology Studies: CARDIAC CATHETERIZATION  Result Date: 04/12/2022 Conclusions: Significant multivessel coronary artery disease, as detailed below.  Chronic total occlusion of the RCA now extends from the ostium to the mid vessel; otherwise, there has been no significant change since prior catheterization in 2020. Normal left and right heart filling pressures. Mildly reduced Fick cardiac output/index. Recommendations: Given minimal progression of coronary artery disease since 2020, recommend continued medical therapy as the  patient is a poor candidate for CABG.  PCI could be considered if she were to have refractory angina/heart failure symptoms, though diffuse nature of the LAD disease as well as LMCA bifurcation involvement would make the procedure very high risk. Aggressive secondary prevention of coronary artery disease.  Consider dual antiplatelet therapy with aspirin and clopidogrel for up to 12 months, as tolerated. Maintain net even fluid balance with escalation of goal-directed medical therapy for HFrEF, as blood pressure and renal function allow. Nelva Bush, MD [facility]       Scheduled Meds:  aspirin EC  81 mg Oral Daily   clopidogrel  75 mg Oral Q breakfast   enoxaparin (LOVENOX) injection  40 mg Subcutaneous Q24H   insulin aspart  0-5 Units Subcutaneous QHS   insulin aspart  0-9 Units Subcutaneous TID WC   ipratropium-albuterol  3 mL Nebulization TID   isosorbide mononitrate  30 mg Oral Daily   metoprolol tartrate  5 mg Intravenous Q12H   montelukast  10 mg Oral Daily   pantoprazole  (PROTONIX) IV  40 mg Intravenous Q24H   potassium chloride  40 mEq Oral BID   rosuvastatin  40 mg Oral Daily   sodium chloride flush  3 mL Intravenous Q12H   Continuous Infusions:  sodium chloride       LOS: 2 days    Time spent: 35 minutes    Barb Merino, MD [facility] Pager [phone]

## 2022-04-13 NOTE — Progress Notes (Addendum)
Mobility Specialist Progress Note   04/13/22 0930  Mobility  Activity Transferred from bed to chair  Level of Assistance Minimal assist, patient does 75% or more  Assistive Device None (HHA)  Distance Ambulated (ft) 3 ft  Range of Motion/Exercises Active;All extremities  Activity Response Tolerated well   Patient received in supine and agreeable to participate. Required mod A for bed mobility from side-lying to sitting. Stood with min A and took steps to recliner with hand-held assist. Tolerated without complaint or incident. Was left in recliner with all needs met, call bell in reach.   [place] Thresia Ramanathan, BS EXP Mobility Specialist Please contact via SecureChat or Rehab office at [phone]

## 2022-04-13 NOTE — Evaluation (Signed)
Clinical/Bedside Swallow Evaluation Patient Details  Name: Susan Patel MRN: [id number] Date of Birth: [birth year]  Today's Date: 04/13/2022 Time: SLP Start Time (ACUTE ONLY): 1125 SLP Stop Time (ACUTE ONLY): 1140 SLP Time Calculation (min) (ACUTE ONLY): 15 min  Past Medical History:  Past Medical History:  Diagnosis Date   Arthritis    'all over"   Asthma    CAD (coronary artery disease)    Multivessel CAD, not a candidate for CABG, managing medically.   Chronic pain    COPD (chronic obstructive pulmonary disease) (HCC)    O2 per [place] at nights    Depression    Diabetes mellitus without complication (HCC)    borderline- , states she was on med., but MD told her "everything is under control so I threw the bottle away"   Fibromyalgia    GERD (gastroesophageal reflux disease)    Hypertension    Neuromuscular disorder ([place])    parkinson, neuropathy- both feet & hands.   Stroke [facility])    residual dysarthria   Past Surgical History:  Past Surgical History:  Procedure Laterality Date   ABDOMINAL HYSTERECTOMY     ANTERIOR CERVICAL DECOMP/DISCECTOMY FUSION N/A 07/10/2015   Procedure: Cervical five-six, Cervical six-seven anterior cervical decompression with fusion plating and bonegraft;  Surgeon: Jovita Gamma, MD;  Location: [facility];  Service: Neurosurgery;  Laterality: N/A;   APPENDECTOMY     BIOPSY EYE MUSCLE  03/20/2016   biopsy vessel to right eye due to swelling   EYE SURGERY Bilateral    cataracts removed, /w "cyrstal lenses"    LEFT HEART CATH AND CORONARY ANGIOGRAPHY N/A 05/23/2018   Procedure: LEFT HEART CATH AND CORONARY ANGIOGRAPHY;  Surgeon: Troy Sine, MD;  Location: [facility];  Service: Cardiovascular;  Laterality: N/A;   RIGHT HEART CATH AND CORONARY ANGIOGRAPHY N/A 04/12/2022   Procedure: RIGHT HEART CATH AND CORONARY ANGIOGRAPHY;  Surgeon: Nelva Bush, MD;  Location: [facility];  Service: Cardiovascular;  Laterality: N/A;   SHOULDER  ARTHROSCOPY Right    x2   RCR- spurs removed    HPI:  Pt is a 81 y.o. F who presents with chest pain and shortness of breath. Found with greatly elevated troponins; chest x-ray showed fluid overload. Pt started on heparin infusion and given IV Lasix. Significant PMH: coronary artery disease, type 2 diabetes, hypertension, previous stroke with residual dysarthria and aphasia, COPD on 3 to 4 L of oxygen at home    Assessment / Plan / Recommendation  Clinical Impression  Patient presents with clinical s/s of dysphagia as per this bedside swallow evaluation. Of note, she has been evaluated and treated by SLP's during previous hospitalizations with most recent being in 2020 with nectar thick liquids being recommended at that time. Most recent MBS was in 2018 and honey thick liquids recommended at that time. Per RN, patient's huband has reported she is eating a regular solids, thin liquids diet at home. During today's evaluation, patient was awake and alert, verbalizes but speech is very dysarthric and voice is low in intensity. She was able to hold cup and give self sips of thin liquids (water), keeping mouth partly open with piecemeal swallowing of liquids (approximately 5-6 swallows for one sip). She exhibited immediate cough response. With nectar thick liquids, she did not exhibit immediate cough but did exhibit a delayed throat clearing response. SLP recommending continue NPO and will proceed with MBS this date. SLP Visit Diagnosis: Dysphagia, unspecified (R13.10)  Aspiration Risk  Moderate aspiration risk    Diet Recommendation NPO   Medication Administration: Via alternative means    Other  Recommendations Oral Care Recommendations: Oral care BID;Staff/trained caregiver to provide oral care    Recommendations for follow up therapy are one component of a multi-disciplinary discharge planning process, led by the attending physician.  Recommendations may be updated based on patient status,  additional functional criteria and insurance authorization.  Follow up Recommendations Skilled nursing-short term rehab (<3 hours/day)      Assistance Recommended at Discharge    Functional Status Assessment Patient has had a recent decline in their functional status and demonstrates the ability to make significant improvements in function in a reasonable and predictable amount of time.  Frequency and Duration min 2x/week  1 week       Prognosis Prognosis for Safe Diet Advancement: Fair Barriers to Reach Goals: Time post onset;Severity of deficits      Swallow Study   General Date of Onset: 04/12/22 HPI: Pt is a 81 y.o. F who presents with chest pain and shortness of breath. Found with greatly elevated troponins; chest x-ray showed fluid overload. Pt started on heparin infusion and given IV Lasix. Significant PMH: coronary artery disease, type 2 diabetes, hypertension, previous stroke with residual dysarthria and aphasia, COPD on 3 to 4 L of oxygen at home Type of Study: Bedside Swallow Evaluation Previous Swallow Assessment: MBS's x2 in 2018, BSE and dysphagia tx 2020 Diet Prior to this Study: NPO Temperature Spikes Noted: No Respiratory Status: Nasal cannula History of Recent Intubation: No Behavior/Cognition: Alert;Cooperative Oral Cavity Assessment: Within Functional Limits Oral Care Completed by SLP: No Oral Cavity - Dentition: Adequate natural dentition Self-Feeding Abilities: Needs set up;Able to feed self Patient Positioning: Upright in bed Baseline Vocal Quality: Low vocal intensity Volitional Cough: Cognitively unable to elicit Volitional Swallow: Unable to elicit    Oral/Motor/Sensory Function Overall Oral Motor/Sensory Function: Moderate impairment Facial ROM: Reduced right;Reduced left Facial Symmetry: Within Functional Limits Facial Strength: Reduced right;Reduced left Lingual ROM: Reduced right;Reduced left Lingual Strength: Reduced   Ice Chips     Thin  Liquid Thin Liquid: Impaired Oral Phase Impairments: Reduced labial seal;Reduced lingual movement/coordination Oral Phase Functional Implications: Oral holding;Prolonged oral transit Pharyngeal  Phase Impairments: Suspected delayed Swallow;Cough - Delayed;Cough - Immediate    Nectar Thick Nectar Thick Liquid: Impaired Presentation: Cup;Self Fed Oral Phase Impairments: Reduced labial seal;Reduced lingual movement/coordination Pharyngeal Phase Impairments: Suspected delayed Swallow;Throat Clearing - Delayed   Honey Thick     Puree Puree: Not tested   Solid     Solid: Not tested     Sonia Baller, MA, CCC-SLP Speech Therapy

## 2022-04-13 NOTE — Progress Notes (Signed)
Heart Failure Navigator Progress Note  Following this hospitalization to assess for HV TOC readiness.   NPO : failed swallow study EF 25-30% G1DD Cath: multi vessel ( med manage)  Earnestine Leys, BSN, RN Heart Failure Transport planner Only

## 2022-04-13 NOTE — Progress Notes (Signed)
TRH night cross cover note:   I was notified by RN that patient has failed bedside swallow screen. She is due for Coreg this evening, and then is due for Plavix tomorrow AM as well as protonix.   I made pt NPO, and confirmed order for SLP consult for swallow eval. I asked that coreg be held this evening (will ulimately need to be reordered), and placed order for single dose of iv lopressor in it's stead. I also converted po protonix to iv.     Babs Bertin, DO Hospitalist

## 2022-04-13 NOTE — Progress Notes (Signed)
   Heart Failure Stewardship Pharmacist Progress Note   PCP: Dettinger, Fransisca Kaufmann, MD PCP-Cardiologist: Fransico Him, MD    HPI:  81 yo F with PMH of multivessel CAD, HTN, HLD, T2DM, CVA x2, COPD, GERD, fibromyalgia, and chronic pain.  She presented to the ED on 11/26 with chest pain and hypoxia. BNP and troponin elevated. CXR with mild CHF. Admitted for acute CHF and NSTEMI. ECHO on 11/26 showed LVEF 25-30%, regional wall motion abnormalities, moderate LVH, G1DD, RV normal, trivial MR. Prior ECHO in May this year with EF 50-55%. R/LHC on 11/27 showed significant multivessel disease and chronic total occlusion of RCA now extending from the ostium to mid vessel, normal filling pressures (RA 6, wedge 10, PA 17), and mildly reduced CO (4.2).  Failed swallow study on 11/27 PM. Now NPO.  Current HF Medications: Beta Blocker: metoprolol tartrate 5 mg IV q12h Other: Imdur 30 mg daily  Prior to admission HF Medications: Beta blocker: carvedilol 12.5 mg BID Other: Imdur 30 mg daily  Pertinent Lab Values: Serum creatinine 1.13, BUN 23, Potassium 3.4, Sodium 139, BNP 1313.1, Magnesium 2.0, A1c 6.6   Vital Signs: Weight: 180 lbs (admission weight: 182 lbs) Blood pressure: 110-150/60s  Heart rate: 70s  I/O: net -1.5L  Medication Assistance / Insurance Benefits Check: Does the patient have prescription insurance?  Yes Type of insurance plan: [place] state health plan  Outpatient Pharmacy:  [facility] to admission [facility] Is the patient willing to use [facility] at discharge? Yes Is the patient willing to transition their outpatient pharmacy to utilize a [facility] outpatient pharmacy?   Pending    Assessment: 1. Acute systolic CHF (LVEF 95-09%), due to mixed ICM and NICM. NYHA class II symptoms. - Does not appear volume overloaded on exam, no indication for diuretics. KCl 40 mEq x 2 ordered for replacement. Strict I/Os and daily weights. Keep K>4 and Mag>2. - Coreg  switched to IV metop with failing swallow study last evening. When she is able to take PO meds, consider switching back to coreg. - Consider adding low dose ARB at night and low dose spiro in AM (to break up administration times) once able to take PO meds - Caution adding SGLT2i with frailty, frequent falls, and limited mobility   Plan: 1) Medication changes recommended at this time: - Once is able to take PO meds again, switch back to coreg and add low dose ARB at night and spironolactone 12.5 mg daily  2) Patient assistance: - Has state health plan, can use monthly copay cards  3)  Education  - To be completed prior to discharge  Kerby Nora, PharmD, BCPS Heart Failure Stewardship Pharmacist Phone [phone]

## 2022-04-14 ENCOUNTER — Inpatient Hospital Stay (HOSPITAL_COMMUNITY): Payer: Medicare Other

## 2022-04-14 DIAGNOSIS — I214 Non-ST elevation (NSTEMI) myocardial infarction: Secondary | ICD-10-CM | POA: Diagnosis not present

## 2022-04-14 LAB — LIPOPROTEIN A (LPA): Lipoprotein (a): 157.1 nmol/L — ABNORMAL HIGH (ref <75.0)

## 2022-04-14 LAB — BASIC METABOLIC PANEL
Anion gap: 12 (ref 5–15)
BUN: 23 mg/dL (ref 8–23)
CO2: 25 mmol/L (ref 22–32)
Calcium: 8.8 mg/dL — ABNORMAL LOW (ref 8.9–10.3)
Chloride: 106 mmol/L (ref 98–111)
Creatinine, Ser: 1.08 mg/dL — ABNORMAL HIGH (ref 0.44–1.00)
GFR, Estimated: 52 mL/min — ABNORMAL LOW (ref >60)
Glucose, Bld: 125 mg/dL — ABNORMAL HIGH (ref 70–99)
Potassium: 3.8 mmol/L (ref 3.5–5.1)
Sodium: 143 mmol/L (ref 135–145)

## 2022-04-14 LAB — GLUCOSE, CAPILLARY
Glucose-Capillary: 117 mg/dL — ABNORMAL HIGH (ref 70–99)
Glucose-Capillary: 131 mg/dL — ABNORMAL HIGH (ref 70–99)
Glucose-Capillary: 185 mg/dL — ABNORMAL HIGH (ref 70–99)
Glucose-Capillary: 130 mg/dL — ABNORMAL HIGH (ref 70–99)

## 2022-04-14 LAB — URINALYSIS, ROUTINE W REFLEX MICROSCOPIC
Bilirubin Urine: NEGATIVE
Glucose, UA: NEGATIVE mg/dL
Ketones, ur: 20 mg/dL — AB
Nitrite: POSITIVE — AB
Protein, ur: 100 mg/dL — AB
RBC / HPF: >50 RBC/hpf — ABNORMAL HIGH (ref 0–5)
Specific Gravity, Urine: 1.025 (ref 1.005–1.030)
pH: 6 (ref 5.0–8.0)

## 2022-04-14 MED ORDER — SPIRONOLACTONE 25 MG PO TABS
25.0000 mg | ORAL_TABLET | Freq: Every day | ORAL | Status: DC
  Administered 2022-04-14 – 2022-04-15 (×2): 25 mg via ORAL
  Filled 2022-04-14 (×2): qty 1

## 2022-04-14 MED ORDER — PANTOPRAZOLE SODIUM 40 MG PO TBEC
40.0000 mg | DELAYED_RELEASE_TABLET | Freq: Every day | ORAL | Status: DC
  Filled 2022-04-14: qty 1

## 2022-04-14 MED ORDER — CARVEDILOL 6.25 MG PO TABS
6.2500 mg | ORAL_TABLET | Freq: Two times a day (BID) | ORAL | Status: DC
  Administered 2022-04-15: 6.25 mg via ORAL
  Filled 2022-04-14: qty 1

## 2022-04-14 MED ORDER — SODIUM CHLORIDE 0.9 % IV SOLN: INTRAVENOUS | Status: DC

## 2022-04-14 NOTE — Progress Notes (Signed)
Mobility Specialist Progress Note    04/14/22 0916  Mobility  Activity Ambulated with assistance in hallway  Level of Assistance +2 (takes two people) (chair follow)  Assistive Device Front wheel walker  Distance Ambulated (ft) 80 ft  Activity Response Tolerated fair  Mobility Referral Yes  $Mobility charge 1 Mobility   Pre-Mobility: 77 HR, 96% SpO2 Post-Mobility: 78 HR, 97% SpO2  Pt received in bed and agreeable. Had x2 LOB leaning to the left. Rolled back in chair. Left with call bell in reach and chair alarm on. Tolerated on RA.   Hildred Alamin Mobility Specialist  Please Psychologist, sport and exercise or Rehab Office at [phone]

## 2022-04-14 NOTE — Progress Notes (Signed)
PROGRESS NOTE    Susan Patel  XAJ:[id number] DOB: [birth year] DOA: 04/11/2022 PCP: Dettinger, Fransisca Kaufmann, MD    Brief Narrative:  81 year old with history of coronary artery disease, type 2 diabetes, hypertension, previous stroke with residual dysarthria and aphasia, COPD on 3 to 4 L of oxygen that she uses at night at home presented to the emergency room with chest pain and shortness of breath for 1 day.  On arrival to emergency room she was found with greatly elevated troponins.  Chest x-ray showed fluid overload.  Patient was started on heparin infusion, given IV Lasix and transferred to [facility] for further cardiology care. 11/27, underwent left heart cath and right heart cath and found to have extensive multivessel disease.  Ejection fraction 25 to 30%.  Conservative management advised. Patient continues to remain dysarthric with difficulty taking medications.   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  Treated with heparin and nitroglycerin.  Underwent cardiac cath with findings above.  She is not a CABG candidate due to severe frailty and debility.  Aspirin, Plavix, Crestor, Coreg and Imdur as not she can tolerate.  Aldactone was added today.  Overall Keep on oxygen to keep saturation more than 90%.  Mobilize.  Type 2 diabetes, well controlled: On Trulicity and Lantus at home.  On sliding scale insulin.  Fairly stable.  Essential hypertension: Blood pressures fairly controlled on nitrates and carvedilol.  Added Aldactone.  COPD with nocturnal hypoxemia: Oxygen as above.  Fairly controlled symptoms on nebulizer therapy. Patient may need additional oxygen at daytime.    Previous stroke with aphasia: Repeat swallow evaluation with severe aspiration risk. Patient has significant aphasia and was unable to swallow  pills.   Patient continues to be significantly dysarthric and not following commands or not eating well. We will check MRI of the brain to see if she suffered any new stroke. Consult palliative care as family had agreed for outpatient palliative care follow-up.  Hypokalemia: Replaced.  Adequate.  I have called and discussed case with patient's husband who tells me that she is at about her baseline.  Husband tells me that she eats regular diet at home.  Nursing staff were not able to feed her.  She will pocket her medications in her mouth and not swallow them.  We will repeat MRI.  If persistent issues with dysphagia, may benefit with palliation or hospice.     DVT prophylaxis: enoxaparin (LOVENOX) injection 40 mg Start: 04/13/22 0800Heparin infusion   Code Status: DNR Family Communication: Husband on the phone. Disposition Plan: Status is: Inpatient Remains inpatient appropriate because: Non-STEMI, unable to swallow safely to eat.     Consultants:  Cardiology  Procedures:  None  Antimicrobials:  None   Subjective: Patient seen and examined.  Difficult to understand.  She tries to say something, nods "No" to any chest pain or shortness of breath.  Wants to go home. Nursing staff administered oral medications and she is still had them on her mouth after 15 minutes.  Objective: Vitals:   04/13/22 2236 04/14/22 0236 04/14/22 0730 04/14/22 1117  BP: 138/79 (!) 136/51 (!) 148/70 (!) 149/99  Pulse: 88  78 81  Resp: 16 20 (!) 23 (!) 27  Temp: 97.9 F (36.6 C) 97.8 F (36.6 C) 97.8 F (36.6 C) 97.8 F (36.6 C)  TempSrc: Oral Oral Oral Oral  SpO2: 97%  98% 94%  Weight:  81 kg    Height:        Intake/Output Summary (Last 24 hours) at 04/14/2022 1325 Last data filed at 04/14/2022 0800 Gross per 24 hour  Intake 10 ml  Output 250 ml  Net -240 ml    Filed Weights   04/12/22 0426 04/13/22 0300 04/14/22 0236  Weight: 82.7 kg 82 kg 81 kg    Examination:  General  exam: Appears calm and comfortable .  Looks anxious. Alert and awake.  Dysarthric but able to answer simple questions. Respiratory system: Clear to auscultation. Respiratory effort normal. Cardiovascular system: S1 & S2 heard, RRR. No JVD, murmurs, rubs, gallops or clicks. No pedal edema. Gastrointestinal system: Abdomen is nondistended, soft and nontender. No organomegaly or masses felt. Normal bowel sounds heard. Central nervous system: Dysarthric.  Difficult to understand words.  Alert and oriented. No focal neurological deficits. Extremities: Symmetric 5 x 5 power.     Data Reviewed: I have personally reviewed following labs and imaging studies  CBC: Recent Labs  Lab 04/11/22 0245 04/12/22 0029 04/12/22 1815 04/12/22 1817 04/13/22 0028  WBC 8.8 8.9  --   --  7.9  NEUTROABS 6.0  --   --   --   --   HGB 13.7 14.3 13.9 13.9 13.3  HCT 41.4 43.3 41.0 41.0 40.6  MCV 88.3 87.7  --   --  88.3  PLT 222 219  --   --  626    Basic Metabolic Panel: Recent Labs  Lab 04/11/22 0245 04/12/22 0029 04/12/22 0815 04/12/22 1815 04/12/22 1817 04/13/22 0028 04/14/22 0037  NA 140 140  --  141 140 139 143  K 3.7 3.1*  --  3.4* 3.4* 3.4* 3.8  CL 108 100  --   --   --  102 106  CO2 24 26  --   --   --  26 25  GLUCOSE 125* 142*  --   --   --  109* 125*  BUN 21 14  --   --   --  23 23  CREATININE 0.80 1.04*  --   --   --  1.13* 1.08*  CALCIUM 8.5* 8.5*  --   --   --  8.7* 8.8*  MG  --   --  2.1  --   --  2.0  --     GFR: Estimated Creatinine Clearance: 40.7 mL/min (A) (by C-G formula based on SCr of 1.08 mg/dL (H)). Liver Function Tests: No results for input(s): "AST", "ALT", "ALKPHOS", "BILITOT", "PROT", "ALBUMIN" in the last 168 hours. No results for input(s): "LIPASE", "AMYLASE" in the last 168 hours. No results for input(s): "AMMONIA" in the last 168 hours. Coagulation Profile: No results for input(s): "INR", "PROTIME" in the last 168 hours. Cardiac Enzymes: No results for  input(s): "CKTOTAL", "CKMB", "CKMBINDEX", "TROPONINI" in the last 168 hours. BNP (last 3 results) No results for input(s): "PROBNP" in the last 8760 hours. HbA1C: No results for input(s): "HGBA1C" in the last 72 hours.  CBG: Recent Labs  Lab 04/13/22 1107 04/13/22 1700 04/13/22 2103 04/14/22 0542 04/14/22 1113  GLUCAP 94 107* 104* 117* 131*    Lipid Profile: No results for input(s): "CHOL", "HDL", "LDLCALC", "TRIG", "CHOLHDL", "LDLDIRECT" in the last 72 hours. Thyroid Function Tests: No results for input(s): "TSH", "T4TOTAL", "FREET4", "T3FREE", "THYROIDAB" in the last 72 hours. Anemia Panel: No results for input(s): "VITAMINB12", "FOLATE", "FERRITIN", "TIBC", "IRON", "RETICCTPCT" in the last  72 hours. Sepsis Labs: No results for input(s): "PROCALCITON", "LATICACIDVEN" in the last 168 hours.  Recent Results (from the past 240 hour(s))  MRSA Next Gen by PCR, Nasal     Status: None   Collection Time: 04/11/22  7:34 PM   Specimen: Nasal Mucosa; Nasal Swab  Result Value Ref Range Status   MRSA by PCR Next Gen NOT DETECTED NOT DETECTED Final    Comment: (NOTE) The GeneXpert MRSA Assay (FDA approved for NASAL specimens only), is one component of a comprehensive MRSA colonization surveillance program. It is not intended to diagnose MRSA infection nor to guide or monitor treatment for MRSA infections. Test performance is not FDA approved in patients less than 54 years old. Performed at [facility], [place] [address], [place], [place] [address]          Radiology Studies: DG Swallowing Func-Speech Pathology  Result Date: 04/13/2022 Table formatting from the original result was not included. Objective Swallowing Evaluation: Type of Study: MBS-Modified Barium Swallow Study  Patient Details Name: ELAYJAH CHANEY MRN: [id number] Date of Birth: 08/10/1940 Today's Date: 04/13/2022 Time: SLP Start Time (ACUTE ONLY): 1545 -SLP Stop Time (ACUTE ONLY): 1600 SLP Time Calculation (min)  (ACUTE ONLY): 15 min Past Medical History: Past Medical History: Diagnosis Date  Arthritis   'all over"  Asthma   CAD (coronary artery disease)   Multivessel CAD, not a candidate for CABG, managing medically.  Chronic pain   COPD (chronic obstructive pulmonary disease) (HCC)   O2 per [place] at nights   Depression   Diabetes mellitus without complication (HCC)   borderline- , states she was on med., but MD told her "everything is under control so I threw the bottle away"  Fibromyalgia   GERD (gastroesophageal reflux disease)   Hypertension   Neuromuscular disorder ([place])   parkinson, neuropathy- both feet & hands.  Stroke [facility])   residual dysarthria Past Surgical History: Past Surgical History: Procedure Laterality Date  ABDOMINAL HYSTERECTOMY    ANTERIOR CERVICAL DECOMP/DISCECTOMY FUSION N/A 07/10/2015  Procedure: Cervical five-six, Cervical six-seven anterior cervical decompression with fusion plating and bonegraft;  Surgeon: Jovita Gamma, MD;  Location: [place];  Service: Neurosurgery;  Laterality: N/A;  APPENDECTOMY    BIOPSY EYE MUSCLE  03/20/2016  biopsy vessel to right eye due to swelling  EYE SURGERY Bilateral   cataracts removed, /w "cyrstal lenses"   LEFT HEART CATH AND CORONARY ANGIOGRAPHY N/A 05/23/2018  Procedure: LEFT HEART CATH AND CORONARY ANGIOGRAPHY;  Surgeon: Troy Sine, MD;  Location: [place] [facility];  Service: Cardiovascular;  Laterality: N/A;  RIGHT HEART CATH AND CORONARY ANGIOGRAPHY N/A 04/12/2022  Procedure: RIGHT HEART CATH AND CORONARY ANGIOGRAPHY;  Surgeon: Nelva Bush, MD;  Location: [place];  Service: Cardiovascular;  Laterality: N/A;  SHOULDER ARTHROSCOPY Right   x2   RCR- spurs removed  HPI: Pt is a 81 y.o. F who presents with chest pain and shortness of breath. Found with greatly elevated troponins; chest x-ray showed fluid overload. Pt started on heparin infusion and given IV Lasix. Significant PMH: coronary artery disease, type 2 diabetes, hypertension,  previous stroke with residual dysarthria and aphasia, COPD on 3 to 4 L of oxygen at home. She was NPO for procedure and then when RN giving patient PO's with her dinner meal tray, she held food in mouth with little to no chewing or transit of boluses and coughing with liquids so she was kept NPO awaiting SLP evaluation.  Subjective: awake,  alert and cooperative  Recommendations for follow up therapy are one component of a multi-disciplinary discharge planning process, led by the attending physician.  Recommendations may be updated based on patient status, additional functional criteria and insurance authorization. Assessment / Plan / Recommendation   04/13/2022   4:20 PM Clinical Impressions Clinical Impression Patient exhibits a moderately impaired oral phase of swallow and a mild-moderately impaired pharyngeal phase of swallow. During oral phase, patient with decreased anterior to posterior transit of liquids and solids, prolonged mastication and holding of puree solids as well as thin and nectar thick liquids. During mastication of mechanical soft solids, patient with premature spillage of tail of bolus into vallecular sinus while patient still masticating the rest of bolus. No oral residuals observed post swallows. During pharyngeal phase of swallow, patient with swallow inititiation delays at level of pyriform sinus with thin liquids via cup sips and nectar thick liquids via straw sips and delays at level of vallecular sinus with nectar thick liquids via cup sips and thin liquids via spoon sips. Patient exhibited flash penetration with cup and straw sips of nectar thick liquids(PAS 2) with penetration events being shallow in depth and fully exiting laryngeal vestibule. Silent aspiration occured on approximately 75% of swallows with thin liquids via cup sips (even when small, controlled sips). Aspiration events occured during the swallow and were trace in amount but patient without cough response or indication  that she was sensate to aspirate. Puree solids, mechanical soft solids transited pharyngeally without difficulty and without residuals; trace vallecular and pyriform residuals observed with nectar thick liquids. SLP is recommending to initiate PO diet of Dys 2 (minced) solids and nectar thick liquids and will follow patient for toleration and ability to advance. SLP Visit Diagnosis Dysphagia, oropharyngeal phase (R13.12) Impact on safety and function Mild aspiration risk;Moderate aspiration risk     04/13/2022   4:20 PM Treatment Recommendations Treatment Recommendations Therapy as outlined in treatment plan below     04/13/2022   4:30 PM Prognosis Prognosis for Safe Diet Advancement Fair Barriers to Reach Goals Time post onset;Severity of deficits Barriers/Prognosis Comment patient with h/o dysphagia requiring modified diet   04/13/2022   4:20 PM Diet Recommendations SLP Diet Recommendations Dysphagia 2 (Fine chop) solids;Nectar thick liquid Liquid Administration via Cup;Straw Medication Administration Whole meds with puree Compensations Slow rate;Small sips/bites Postural Changes Seated upright at 90 degrees     04/13/2022   4:20 PM Other Recommendations Oral Care Recommendations Oral care BID Follow Up Recommendations Skilled nursing-short term rehab (<3 hours/day) Functional Status Assessment Patient has had a recent decline in their functional status and demonstrates the ability to make significant improvements in function in a reasonable and predictable amount of time.   04/13/2022   4:20 PM Frequency and Duration  Speech Therapy Frequency (ACUTE ONLY) min 2x/week Treatment Duration 1 week     04/13/2022   4:09 PM Oral Phase Oral Phase Impaired Oral - Nectar Cup Weak lingual manipulation;Reduced posterior propulsion;Holding of bolus;Delayed oral transit;Piecemeal swallowing Oral - Nectar Straw Delayed oral transit;Holding of bolus;Reduced posterior propulsion;Weak lingual manipulation Oral - Thin Teaspoon  Delayed oral transit;Weak lingual manipulation;Reduced posterior propulsion;Holding of bolus Oral - Thin Cup Holding of bolus;Delayed oral transit;Reduced posterior propulsion;Weak lingual manipulation Oral - Puree Delayed oral transit;Weak lingual manipulation;Reduced posterior propulsion;Piecemeal swallowing Oral - Mech Soft Delayed oral transit;Piecemeal swallowing;Impaired mastication;Reduced posterior propulsion    04/13/2022   4:12 PM Pharyngeal Phase Pharyngeal Phase Impaired Pharyngeal- Nectar Cup Delayed swallow initiation-vallecula;Penetration/Aspiration during swallow;Pharyngeal residue -  valleculae;Pharyngeal residue - pyriform Pharyngeal Material enters airway, remains ABOVE vocal cords then ejected out Pharyngeal- Nectar Straw Delayed swallow initiation-pyriform sinuses;Pharyngeal residue - valleculae;Pharyngeal residue - pyriform Pharyngeal- Thin Teaspoon Delayed swallow initiation-pyriform sinuses;Penetration/Aspiration during swallow Pharyngeal Material enters airway, remains ABOVE vocal cords then ejected out Pharyngeal- Thin Cup Trace aspiration;Penetration/Aspiration during swallow;Reduced airway/laryngeal closure;Delayed swallow initiation-pyriform sinuses Pharyngeal Material enters airway, passes BELOW cords without attempt by patient to eject out (silent aspiration)    04/13/2022   4:19 PM Cervical Esophageal Phase  Cervical Esophageal Phase [facility] Sonia Baller, MA, CCC-SLP Speech Therapy                     CARDIAC CATHETERIZATION  Result Date: 04/12/2022 Conclusions: Significant multivessel coronary artery disease, as detailed below.  Chronic total occlusion of the RCA now extends from the ostium to the mid vessel; otherwise, there has been no significant change since prior catheterization in 2020. Normal left and right heart filling pressures. Mildly reduced Fick cardiac output/index. Recommendations: Given minimal progression of coronary artery disease since 2020, recommend continued  medical therapy as the patient is a poor candidate for CABG.  PCI could be considered if she were to have refractory angina/heart failure symptoms, though diffuse nature of the LAD disease as well as LMCA bifurcation involvement would make the procedure very high risk. Aggressive secondary prevention of coronary artery disease.  Consider dual antiplatelet therapy with aspirin and clopidogrel for up to 12 months, as tolerated. Maintain net even fluid balance with escalation of goal-directed medical therapy for HFrEF, as blood pressure and renal function allow. Nelva Bush, MD [facility]       Scheduled Meds:  aspirin EC  81 mg Oral Daily   carvedilol  6.25 mg Oral BID WC   clopidogrel  75 mg Oral Q breakfast   enoxaparin (LOVENOX) injection  40 mg Subcutaneous Q24H   insulin aspart  0-5 Units Subcutaneous QHS   insulin aspart  0-9 Units Subcutaneous TID WC   isosorbide mononitrate  30 mg Oral Daily   montelukast  10 mg Oral Daily   pantoprazole  40 mg Oral Daily   rosuvastatin  40 mg Oral Daily   sodium chloride flush  3 mL Intravenous Q12H   spironolactone  25 mg Oral Daily   Continuous Infusions:  sodium chloride       LOS: 3 days    Time spent: 35 minutes    Barb Merino, MD [facility] Pager [phone]

## 2022-04-14 NOTE — Progress Notes (Signed)
Rounding Note    Patient Name: Susan Patel Date of Encounter: 04/14/2022  [place] Cardiologist: Fransico Him, MD   Subjective   Sitting up on side of bed, unable to articulate concern but in no acute distress. Working with mobility team.   Inpatient Medications    Scheduled Meds:  aspirin EC  81 mg Oral Daily   clopidogrel  75 mg Oral Q breakfast   enoxaparin (LOVENOX) injection  40 mg Subcutaneous Q24H   insulin aspart  0-5 Units Subcutaneous QHS   insulin aspart  0-9 Units Subcutaneous TID WC   ipratropium-albuterol  3 mL Nebulization BID   isosorbide mononitrate  30 mg Oral Daily   montelukast  10 mg Oral Daily   pantoprazole  40 mg Oral Daily   rosuvastatin  40 mg Oral Daily   sodium chloride flush  3 mL Intravenous Q12H   Continuous Infusions:  sodium chloride     PRN Meds: sodium chloride, acetaminophen **OR** acetaminophen, albuterol, nitroGLYCERIN, ondansetron **OR** ondansetron (ZOFRAN) IV, sodium chloride flush, traZODone   Vital Signs    Vitals:   04/13/22 2054 04/13/22 2236 04/14/22 0236 04/14/22 0730  BP:  138/79 (!) 136/51 (!) 148/70  Pulse:  88  78  Resp:  16 20 (!) 23  Temp:  97.9 F (36.6 C) 97.8 F (36.6 C) 97.8 F (36.6 C)  TempSrc:  Oral Oral Oral  SpO2: 95% 97%  98%  Weight:   81 kg   Height:        Intake/Output Summary (Last 24 hours) at 04/14/2022 0905 Last data filed at 04/14/2022 0800 Gross per 24 hour  Intake 10 ml  Output 250 ml  Net -240 ml      04/14/2022    2:36 AM 04/13/2022    3:00 AM 04/12/2022    4:26 AM  Last 3 Weights  Weight (lbs) 178 lb 9.2 oz 180 lb 12.4 oz 182 lb 5.1 oz  Weight (kg) 81 kg 82 kg 82.7 kg      Telemetry    Sinus rhythm with rates mostly in the 60s to 70s. PVCs in couplets. - Personally Reviewed  ECG    No new ECG tracing today. - Personally Reviewed  Physical Exam   GEN: Elderly Caucasian female sitting on side of bed comfortably in no acute distress.   Neck: No  significant JVD noted. Cardiac: RRR with occasional ectopy. No murmurs, rubs, or gallops.  Respiratory: Clear to auscultation anteriorly with no wheezes, rhonchi, or rales noted. GI: Soft and non-distended. MS: No lower extremity edema; No deformity. Skin: Warm and dry. Neuro:  alert but unable to assess orientation today due to aphasia Psych: unable to assess  Labs    High Sensitivity Troponin:   Recent Labs  Lab 04/11/22 0245 04/11/22 0558 04/12/22 0553  TROPONINIHS 350* 1,394* 6,954*     Chemistry Recent Labs  Lab 04/12/22 0029 04/12/22 0815 04/12/22 1815 04/12/22 1817 04/13/22 0028 04/14/22 0037  NA 140  --    < > 140 139 143  K 3.1*  --    < > 3.4* 3.4* 3.8  CL 100  --   --   --  102 106  CO2 26  --   --   --  26 25  GLUCOSE 142*  --   --   --  109* 125*  BUN 14  --   --   --  23 23  CREATININE 1.04*  --   --   --  1.13* 1.08*  CALCIUM 8.5*  --   --   --  8.7* 8.8*  MG  --  2.1  --   --  2.0  --   GFRNONAA 55*  --   --   --  49* 52*  ANIONGAP 14  --   --   --  11 12   < > = values in this interval not displayed.    Lipids No results for input(s): "CHOL", "TRIG", "HDL", "LABVLDL", "LDLCALC", "CHOLHDL" in the last 168 hours.  Hematology Recent Labs  Lab 04/11/22 0245 04/12/22 0029 04/12/22 1815 04/12/22 1817 04/13/22 0028  WBC 8.8 8.9  --   --  7.9  RBC 4.69 4.94  --   --  4.60  HGB 13.7 14.3 13.9 13.9 13.3  HCT 41.4 43.3 41.0 41.0 40.6  MCV 88.3 87.7  --   --  88.3  MCH 29.2 28.9  --   --  28.9  MCHC 33.1 33.0  --   --  32.8  RDW 13.9 13.8  --   --  13.7  PLT 222 219  --   --  198   Thyroid No results for input(s): "TSH", "FREET4" in the last 168 hours.  BNP Recent Labs  Lab 04/11/22 0245 04/12/22 0029  BNP 563.0* 1,313.1*    DDimer No results for input(s): "DDIMER" in the last 168 hours.   Radiology    DG Swallowing Func-Speech Pathology  Result Date: 04/13/2022 Table formatting from the original result was not included. Objective  Swallowing Evaluation: Type of Study: MBS-Modified Barium Swallow Study  Patient Details Name: Susan Patel MRN: [id number] Date of Birth: 1941/05/04 Today's Date: 04/13/2022 Time: SLP Start Time (ACUTE ONLY): 1545 -SLP Stop Time (ACUTE ONLY): 1600 SLP Time Calculation (min) (ACUTE ONLY): 15 min Past Medical History: Past Medical History: Diagnosis Date  Arthritis   'all over"  Asthma   CAD (coronary artery disease)   Multivessel CAD, not a candidate for CABG, managing medically.  Chronic pain   COPD (chronic obstructive pulmonary disease) (HCC)   O2 per [place] at nights   Depression   Diabetes mellitus without complication (HCC)   borderline- , states she was on med., but MD told her "everything is under control so I threw the bottle away"  Fibromyalgia   GERD (gastroesophageal reflux disease)   Hypertension   Neuromuscular disorder ([place])   parkinson, neuropathy- both feet & hands.  Stroke [facility])   residual dysarthria Past Surgical History: Past Surgical History: Procedure Laterality Date  ABDOMINAL HYSTERECTOMY    ANTERIOR CERVICAL DECOMP/DISCECTOMY FUSION N/A 07/10/2015  Procedure: Cervical five-six, Cervical six-seven anterior cervical decompression with fusion plating and bonegraft;  Surgeon: Jovita Gamma, MD;  Location: [place] [facility];  Service: Neurosurgery;  Laterality: N/A;  APPENDECTOMY    BIOPSY EYE MUSCLE  03/20/2016  biopsy vessel to right eye due to swelling  EYE SURGERY Bilateral   cataracts removed, /w "cyrstal lenses"   LEFT HEART CATH AND CORONARY ANGIOGRAPHY N/A 05/23/2018  Procedure: LEFT HEART CATH AND CORONARY ANGIOGRAPHY;  Surgeon: Troy Sine, MD;  Location: [place] [facility];  Service: Cardiovascular;  Laterality: N/A;  RIGHT HEART CATH AND CORONARY ANGIOGRAPHY N/A 04/12/2022  Procedure: RIGHT HEART CATH AND CORONARY ANGIOGRAPHY;  Surgeon: Nelva Bush, MD;  Location: [place] [facility];  Service: Cardiovascular;  Laterality: N/A;  SHOULDER ARTHROSCOPY Right   x2   RCR- spurs removed  HPI:  Pt is a 81 y.o. F who presents with  chest pain and shortness of breath. Found with greatly elevated troponins; chest x-ray showed fluid overload. Pt started on heparin infusion and given IV Lasix. Significant PMH: coronary artery disease, type 2 diabetes, hypertension, previous stroke with residual dysarthria and aphasia, COPD on 3 to 4 L of oxygen at home. She was NPO for procedure and then when RN giving patient PO's with her dinner meal tray, she held food in mouth with little to no chewing or transit of boluses and coughing with liquids so she was kept NPO awaiting SLP evaluation.  Subjective: awake, alert and cooperative  Recommendations for follow up therapy are one component of a multi-disciplinary discharge planning process, led by the attending physician.  Recommendations may be updated based on patient status, additional functional criteria and insurance authorization. Assessment / Plan / Recommendation   04/13/2022   4:20 PM Clinical Impressions Clinical Impression Patient exhibits a moderately impaired oral phase of swallow and a mild-moderately impaired pharyngeal phase of swallow. During oral phase, patient with decreased anterior to posterior transit of liquids and solids, prolonged mastication and holding of puree solids as well as thin and nectar thick liquids. During mastication of mechanical soft solids, patient with premature spillage of tail of bolus into vallecular sinus while patient still masticating the rest of bolus. No oral residuals observed post swallows. During pharyngeal phase of swallow, patient with swallow inititiation delays at level of pyriform sinus with thin liquids via cup sips and nectar thick liquids via straw sips and delays at level of vallecular sinus with nectar thick liquids via cup sips and thin liquids via spoon sips. Patient exhibited flash penetration with cup and straw sips of nectar thick liquids(PAS 2) with penetration events being shallow in depth and fully  exiting laryngeal vestibule. Silent aspiration occured on approximately 75% of swallows with thin liquids via cup sips (even when small, controlled sips). Aspiration events occured during the swallow and were trace in amount but patient without cough response or indication that she was sensate to aspirate. Puree solids, mechanical soft solids transited pharyngeally without difficulty and without residuals; trace vallecular and pyriform residuals observed with nectar thick liquids. SLP is recommending to initiate PO diet of Dys 2 (minced) solids and nectar thick liquids and will follow patient for toleration and ability to advance. SLP Visit Diagnosis Dysphagia, oropharyngeal phase (R13.12) Impact on safety and function Mild aspiration risk;Moderate aspiration risk     04/13/2022   4:20 PM Treatment Recommendations Treatment Recommendations Therapy as outlined in treatment plan below     04/13/2022   4:30 PM Prognosis Prognosis for Safe Diet Advancement Fair Barriers to Reach Goals Time post onset;Severity of deficits Barriers/Prognosis Comment patient with h/o dysphagia requiring modified diet   04/13/2022   4:20 PM Diet Recommendations SLP Diet Recommendations Dysphagia 2 (Fine chop) solids;Nectar thick liquid Liquid Administration via Cup;Straw Medication Administration Whole meds with puree Compensations Slow rate;Small sips/bites Postural Changes Seated upright at 90 degrees     04/13/2022   4:20 PM Other Recommendations Oral Care Recommendations Oral care BID Follow Up Recommendations Skilled nursing-short term rehab (<3 hours/day) Functional Status Assessment Patient has had a recent decline in their functional status and demonstrates the ability to make significant improvements in function in a reasonable and predictable amount of time.   04/13/2022   4:20 PM Frequency and Duration  Speech Therapy Frequency (ACUTE ONLY) min 2x/week Treatment Duration 1 week     04/13/2022   4:09 PM Oral Phase Oral Phase  Impaired Oral -  Nectar Cup Weak lingual manipulation;Reduced posterior propulsion;Holding of bolus;Delayed oral transit;Piecemeal swallowing Oral - Nectar Straw Delayed oral transit;Holding of bolus;Reduced posterior propulsion;Weak lingual manipulation Oral - Thin Teaspoon Delayed oral transit;Weak lingual manipulation;Reduced posterior propulsion;Holding of bolus Oral - Thin Cup Holding of bolus;Delayed oral transit;Reduced posterior propulsion;Weak lingual manipulation Oral - Puree Delayed oral transit;Weak lingual manipulation;Reduced posterior propulsion;Piecemeal swallowing Oral - Mech Soft Delayed oral transit;Piecemeal swallowing;Impaired mastication;Reduced posterior propulsion    04/13/2022   4:12 PM Pharyngeal Phase Pharyngeal Phase Impaired Pharyngeal- Nectar Cup Delayed swallow initiation-vallecula;Penetration/Aspiration during swallow;Pharyngeal residue - valleculae;Pharyngeal residue - pyriform Pharyngeal Material enters airway, remains ABOVE vocal cords then ejected out Pharyngeal- Nectar Straw Delayed swallow initiation-pyriform sinuses;Pharyngeal residue - valleculae;Pharyngeal residue - pyriform Pharyngeal- Thin Teaspoon Delayed swallow initiation-pyriform sinuses;Penetration/Aspiration during swallow Pharyngeal Material enters airway, remains ABOVE vocal cords then ejected out Pharyngeal- Thin Cup Trace aspiration;Penetration/Aspiration during swallow;Reduced airway/laryngeal closure;Delayed swallow initiation-pyriform sinuses Pharyngeal Material enters airway, passes BELOW cords without attempt by patient to eject out (silent aspiration)    04/13/2022   4:19 PM Cervical Esophageal Phase  Cervical Esophageal Phase [facility] Sonia Baller, MA, CCC-SLP Speech Therapy                     CARDIAC CATHETERIZATION  Result Date: 04/12/2022 Conclusions: Significant multivessel coronary artery disease, as detailed below.  Chronic total occlusion of the RCA now extends from the ostium to the mid vessel;  otherwise, there has been no significant change since prior catheterization in 2020. Normal left and right heart filling pressures. Mildly reduced Fick cardiac output/index. Recommendations: Given minimal progression of coronary artery disease since 2020, recommend continued medical therapy as the patient is a poor candidate for CABG.  PCI could be considered if she were to have refractory angina/heart failure symptoms, though diffuse nature of the LAD disease as well as LMCA bifurcation involvement would make the procedure very high risk. Aggressive secondary prevention of coronary artery disease.  Consider dual antiplatelet therapy with aspirin and clopidogrel for up to 12 months, as tolerated. Maintain net even fluid balance with escalation of goal-directed medical therapy for HFrEF, as blood pressure and renal function allow. Nelva Bush, MD [facility]   Cardiac Studies   Echocardiogram 04/11/2022: Impressions:  1. Left ventricular ejection fraction, by estimation, is 25 to 30%. The  left ventricle has severely decreased function. The left ventricle  demonstrates regional wall motion abnormalities (see scoring  diagram/findings for description). The left  ventricular internal cavity size was moderately dilated. There is moderate  left ventricular hypertrophy. Left ventricular diastolic parameters are  consistent with Grade I diastolic dysfunction (impaired relaxation). There  is akinesis of the left  ventricular, entire inferolateral wall. There is mild dyskinesis of the  left ventricular, entire apical segment. There is severe hypokinesis of  the left ventricular, inferior wall.   2. Right ventricular systolic function is normal. The right ventricular  size is normal. Tricuspid regurgitation signal is inadequate for assessing  PA pressure.   3. Left atrial size was mild to moderately dilated.   4. The mitral valve is normal in structure. Trivial mitral valve  regurgitation.   5.  The aortic valve is tricuspid. Aortic valve regurgitation is trivial.  Aortic valve sclerosis is present, with no evidence of aortic valve  stenosis.   Comparison(s): The left ventricular function is significantly worse. The  left ventricular wall motion abnormalities are new.   Patient Profile     81 y.o. female with a history of  multivessel CAD noted on cardiac catheterization in 05/2018 (not felt to be a candidate for CABG and treated medically), hypertension, hyperlipidemia, type 2 diabetes mellitus, CVA x2 with residual dysarthria, COPD on O2 at night, GERD, fibromyalgia, and chronic pain who was admitted on 04/11/2022 for NSTEMI  and acute CHF after presenting with chest pain and shortness of breath. Initial noted to be hypoxic and required non-rebreather; however, respiratory status improved after diuresis.  Assessment & Plan    NSTEMI CAD Patient has known severe multivessel disease. LHC in 05/2018 showed 60% stenosis of distal left main, 30% stenosis of ostial to proximal LAD followed by another 30% stenosis of proximal to mid LAD and 70% stenosis of mid LAD, 100% stenosis of proximal to mid LAD, 50% stenosis of ostial to proximal LCX followed by 20% stenosis of proximal to mid LCX, and 50% stenosis of ostial 1st Diag. She was seen by CT surgery at that time and not felt to be a candidate for surgery given underlying medical conditions; therefore, she was treated medically. Now presents with chest pain and shortness of breath. EKG showed normal sinus rhythm with LBBB. High-sensitivity troponin 350 >> 1,394. Echo shows newly reduced EF of 25-30% with multiple wall motion abnormalities. Cath showed stable multivessel CAD, recommendation for continued medication management.  - Continue Coreg 6.'25mg'$  twice daily and Imdur '30mg'$  daily. - Continue Aspirin, Plavix and Crestor. DAPT to be continued for 1 year if tolerated and no bleeding. Recommend protonix as well.   New Onset Acute Systolic  CHF Patient presented with chest pain and shortness of breath. She was initial hypoxic and required a non-rebreather. BNP elevated at 563 >> 1,313. Chest x-ray showed vascular congestion noted centrally with mild edema consistent with CHF. Echo showed LVEF of 25-30% with akinesis of the entire inferolateral wall and mild dyskinesis of the entire apical segment as well as severe hypokinesis of the inferior wall. She was started on IV Lasix with improvement in respiratory status.  - Does not appear significantly volume overloaded. - Will hold additional IV Lasix for now given bump in creatinine. - Continue Coreg 6.'25mg'$  twice daily and Imdur '30mg'$  daily. - Will try to add additional GDMT as BP and renal function allow. Heart Failure Therapy ACE-I/ARB/ARNI: none currently, consider adding as renal function trends to normal.  BB: coreg 6.25 mg BID MRA: start spironolactone 25 mg daily today.  SGLT2I: none, would likely not add Diuretic plan: hold lasix today  If BP remains elevated after addition of spironolactone, consider adding low dose ARB at night, however may want to add with caution given patient frailty and possibility of contributing to hypotension or orthostasis.  Hypertension - Continue Coreg and Imdur as above.  Hyperlipidemia Lipid panel in 02/2022: Total Cholesterol 152, Trigylcerides 103, HDL 47, LDL 86. LDL goal <55. - Will increase home Crestor from '20mg'$  to '40mg'$  daily. - Will need repeat lipid panel and LFTs in 6-8 weeks.  Hypokalemia Potassium 3.8 this morning - adding spironolactone.   Otherwise, per primary team: - Type 2 diabetes mellitus - GERD - Fibromyalgia - Chronic pain - History of CVA with aphasia  If patient ambulates well with mobility/PT without hypoxia, she is nearing readiness from CV standpoint for hospital dismissal with close follow up.    For questions or updates, please contact [place] Please consult [email or web address] for contact info  under   Elouise Munroe, MD 9:05 AM 04/14/22

## 2022-04-14 NOTE — Progress Notes (Signed)
Physical Therapy Treatment Patient Details Name: Susan Patel MRN: [id number] DOB: Dec 05, 1940 Today's Date: 04/14/2022   History of Present Illness Pt is a 81 y.o. F who presents with chest pain and shortness of breath. Found with greatly elevated troponins; chest x-ray showed fluid overload. Pt started on heparin infusion and given IV Lasix. Significant PMH: coronary artery disease, type 2 diabetes, hypertension, previous stroke with residual dysarthria and aphasia, COPD on 3 to 4 L of oxygen at home.    PT Comments    Pt with acute change both physically and cognitively. Pt received sitting up in chair; noted to have Glucerna shake residue still in mouth with no attempts to swallow. Pt lethargic and not following commands, although VSS and SpO2 96% on RA. Pt requiring two person maximal assist for stand pivot transfer back to bed. NT present to perform suctioning and oral care. Of note, pt ambulating in hallway this AM. Notified RN and MD of above. Updated discharge plan in light of regression.   Recommendations for follow up therapy are one component of a multi-disciplinary discharge planning process, led by the attending physician.  Recommendations may be updated based on patient status, additional functional criteria and insurance authorization.  Follow Up Recommendations  Skilled nursing-short term rehab (<3 hours/day) Can patient physically be transported by private vehicle: No   Assistance Recommended at Discharge Frequent or constant Supervision/Assistance  Patient can return home with the following Two people to help with walking and/or transfers;Two people to help with bathing/dressing/bathroom   Equipment Recommendations  BSC/3in1    Recommendations for Other Services       Precautions / Restrictions Precautions Precautions: Fall;Other (comment) Precaution Comments: expressive aphasia Restrictions Weight Bearing Restrictions: No     Mobility  Bed Mobility Overal  bed mobility: Needs Assistance Bed Mobility: Sit to Supine       Sit to supine: Max assist, +2 for physical assistance   General bed mobility comments: Assist at trunk and BLE's    Transfers Overall transfer level: Needs assistance Equipment used: None Transfers: Sit to/from Stand, Bed to chair/wheelchair/BSC Sit to Stand: Max assist, +2 physical assistance Stand pivot transfers: Max assist, +2 physical assistance         General transfer comment: MaxA + 2 for stand pivot transfer from bed to chair; pt unable to stand upright    Ambulation/Gait                   Stairs             Wheelchair Mobility    Modified Rankin (Stroke Patients Only)       Balance Overall balance assessment: Needs assistance Sitting-balance support: Feet supported Sitting balance-Leahy Scale: Poor     Standing balance support: Bilateral upper extremity supported Standing balance-Leahy Scale: Zero Standing balance comment: dependent on therapist                            Cognition Arousal/Alertness: Awake/alert Behavior During Therapy: Flat affect Overall Cognitive Status: History of cognitive impairments - at baseline                                 General Comments: Hx stroke with residual dysarthria and aphasia. Pt able to answer yes/no questions        Exercises      General Comments  Pertinent Vitals/Pain Pain Assessment Pain Assessment: Faces Faces Pain Scale: No hurt    Home Living                          Prior Function            PT Goals (current goals can now be found in the care plan section) Acute Rehab PT Goals Patient Stated Goal: did not state Potential to Achieve Goals: Fair Progress towards PT goals: Not progressing toward goals - comment (lethargy today)    Frequency    Min 3X/week      PT Plan Discharge plan needs to be updated    Co-evaluation              AM-PAC PT "6  Clicks" Mobility   Outcome Measure  Help needed turning from your back to your side while in a flat bed without using bedrails?: A Lot Help needed moving from lying on your back to sitting on the side of a flat bed without using bedrails?: Total Help needed moving to and from a bed to a chair (including a wheelchair)?: Total Help needed standing up from a chair using your arms (e.g., wheelchair or bedside chair)?: Total Help needed to walk in hospital room?: Total Help needed climbing 3-5 steps with a railing? : Total 6 Click Score: 7    End of Session   Activity Tolerance: Patient limited by lethargy Patient left: in bed;with call bell/phone within reach;with nursing/sitter in room Nurse Communication: Mobility status;Other (comment) (AMS) PT Visit Diagnosis: Unsteadiness on feet (R26.81);History of falling (Z91.81)     Time: 2876-8115 PT Time Calculation (min) (ACUTE ONLY): 31 min  Charges:  $Therapeutic Activity: 23-37 mins                     Susan Patel, PT, DPT Acute Rehabilitation Services Office [phone]    Susan Patel 04/14/2022, 4:40 PM

## 2022-04-14 NOTE — Care Management Important Message (Signed)
Important Message  Patient Details  Name: Susan Patel MRN: [id number] Date of Birth: December 22, 1940   Medicare Important Message Given:  Yes     Orbie Pyo 04/14/2022, 11:16 AM

## 2022-04-14 NOTE — IPAL (Signed)
  Interdisciplinary Goals of Care Family Meeting   Date carried out: 04/14/2022  Location of the meeting: Bedside  Member's involved: Physician, Bedside Registered Nurse, and Family Member or next of kin  Durable Power of Attorney or acting medical decision maker: Susan Patel , Spouse     Discussion: We discussed goals of care for Susan Patel .  Patient had been observed to have difficulty swallowing since her admission that was waning and waxing.  Today morning, patient was more alert and able mobilized with mobility tech.  By afternoon, she was noticed to be more sleepy and lethargic and hard to arouse.  Patient seen and examined in the evening.  She follows simple commands.  She is more sleepy.  There is no focal deficits.  She moves all extremities. Nursing reported that she is not able to take any oral medications, she is pocketing her medications in her mouth that needed suctioning to take it out.  At risk of aspiration.  Patient is overall in very poor health, extensive medical issues.  Worsening congestive heart failure, non-STEMI, coronary artery disease not amenable to stenting.  Adult failure to thrive and now with worsening dysphagia.  We discussed about doing reasonable test to see if she has any reversible conditions.  We will order MRI of the brain to see if there is any new stroke than previously available.  Patient is not a tPA candidate due to severe frailty, poor chances of recovery and rehab as well as unknown when she has symptoms. We will check urine analysis to rule out any infection. Monitor patient overnight. She is clinically dry, even though her ejection fraction is low we will start patient on gentle IV fluids for overnight 50 mill per hour. I have consulted palliative care team. I discussed with the patient's husband about her overall condition and what she wished to be done if she is not waking up and husband agrees with hospice level of care if she is not able  to eat and not able to have interaction. Plan is to monitor overnight, to check if there are any reversible conditions.  If further worsening of clinical status, changed to comfort care and hospice and anticipate home hospice or even inpatient hospice.  Code status: Full DNR  Disposition: Continue current acute care  Time spent for the meeting: 35 minutes  If patient has any decompensation overnight, please call patient's husband and treat with comfort care.     Barb Merino, MD  04/14/2022, 4:58 PM

## 2022-04-14 NOTE — Progress Notes (Signed)
   Heart Failure Stewardship Pharmacist Progress Note   PCP: Dettinger, Fransisca Kaufmann, MD PCP-Cardiologist: Fransico Him, MD    HPI:  81 yo F with PMH of multivessel CAD, HTN, HLD, T2DM, CVA x2, COPD, GERD, fibromyalgia, and chronic pain.  She presented to the ED on 11/26 with chest pain and hypoxia. BNP and troponin elevated. CXR with mild CHF. Admitted for acute CHF and NSTEMI. ECHO on 11/26 showed LVEF 25-30%, regional wall motion abnormalities, moderate LVH, G1DD, RV normal, trivial MR. Prior ECHO in May this year with EF 50-55%. R/LHC on 11/27 showed significant multivessel disease and chronic total occlusion of RCA now extending from the ostium to mid vessel, normal filling pressures (RA 6, wedge 10, PA 17), and mildly reduced CO (4.2).  Failed swallow study on 11/27 PM, was NPO. Dysphagia diet resumed 11/28.  Current HF Medications: MRA: spironolactone 25 mg daily Other: Imdur 30 mg daily  Prior to admission HF Medications: Beta blocker: carvedilol 12.5 mg BID Other: Imdur 30 mg daily  Pertinent Lab Values: Serum creatinine 1.08, BUN 23, Potassium 3.8, Sodium 143, BNP 1313.1, Magnesium 2.0, A1c 6.6   Vital Signs: Weight: 178 lbs (admission weight: 182 lbs) Blood pressure: 150/60s  Heart rate: 70-80s  I/O: net -1.8L  Medication Assistance / Insurance Benefits Check: Does the patient have prescription insurance?  Yes Type of insurance plan: [place] state health plan  Outpatient Pharmacy:  Prior to admission [facility] Is the patient willing to use [facility] at discharge? Yes Is the patient willing to transition their outpatient pharmacy to utilize a [facility] outpatient pharmacy?   Pending    Assessment: 1. Acute systolic CHF (LVEF 67-12%), due to mixed ICM and NICM. NYHA class II symptoms. - Does not appear volume overloaded on exam, no indication for diuretics. Strict I/Os and daily weights. Keep K>4 and Mag>2. - Coreg switched to IV metop with  failing swallow study 11/27. Diet resumed 11/28. Consider restarting carvedilol 6.25 mg BID. - Consider adding low dose ARB at night if BP remains elevated - Agree with adding spironolactone 25 mg daily - Caution adding SGLT2i with frailty, frequent falls, and limited mobility   Plan: 1) Medication changes recommended at this time: - Restart carvedilol 6.25 mg BID  2) Patient assistance: - Has state health plan, can use monthly copay cards  3)  Education  - To be completed prior to discharge  Kerby Nora, PharmD, BCPS Heart Failure Stewardship Pharmacist Phone [phone]

## 2022-04-14 NOTE — Progress Notes (Signed)
SLP Cancellation Note  Patient Details Name: Susan Patel MRN: [id number] DOB: February 13, 1941   Cancelled treatment:       Reason Eval/Treat Not Completed: Patient's level of consciousness;Fatigue/lethargy limiting ability to participate. PT notified SLP that patient appearing with significant change in status physically and cognitively and had liquid PO's (Glucerna shake) in mouth with patient not attempting to swallow. MD has ordered an MRI. SLP checked on patient but she was sleeping and did not arouse to gentle touch and voice, SLP did not attempt to wake her any further. Spouse was in room but he was asking for nurse and did not engage in conversation when SLP introduced self and role in patient's care. Recommend continue NPO and SLP will f/u next date to determine if able to trial PO's.    Sonia Baller, MA, CCC-SLP Speech Therapy

## 2022-04-15 DIAGNOSIS — I214 Non-ST elevation (NSTEMI) myocardial infarction: Secondary | ICD-10-CM | POA: Diagnosis not present

## 2022-04-15 DIAGNOSIS — Z66 Do not resuscitate: Secondary | ICD-10-CM

## 2022-04-15 DIAGNOSIS — Z515 Encounter for palliative care: Secondary | ICD-10-CM

## 2022-04-15 DIAGNOSIS — R7309 Other abnormal glucose: Secondary | ICD-10-CM

## 2022-04-15 DIAGNOSIS — I5033 Acute on chronic diastolic (congestive) heart failure: Secondary | ICD-10-CM | POA: Diagnosis not present

## 2022-04-15 DIAGNOSIS — Z789 Other specified health status: Secondary | ICD-10-CM

## 2022-04-15 DIAGNOSIS — R7989 Other specified abnormal findings of blood chemistry: Secondary | ICD-10-CM | POA: Diagnosis not present

## 2022-04-15 DIAGNOSIS — Z7189 Other specified counseling: Secondary | ICD-10-CM

## 2022-04-15 DIAGNOSIS — Z711 Person with feared health complaint in whom no diagnosis is made: Secondary | ICD-10-CM

## 2022-04-15 DIAGNOSIS — R4702 Dysphasia: Secondary | ICD-10-CM

## 2022-04-15 LAB — GLUCOSE, CAPILLARY
Glucose-Capillary: 126 mg/dL — ABNORMAL HIGH (ref 70–99)
Glucose-Capillary: 183 mg/dL — ABNORMAL HIGH (ref 70–99)
Glucose-Capillary: 93 mg/dL (ref 70–99)
Glucose-Capillary: 118 mg/dL — ABNORMAL HIGH (ref 70–99)

## 2022-04-15 LAB — BASIC METABOLIC PANEL
Anion gap: 15 (ref 5–15)
BUN: 20 mg/dL (ref 8–23)
CO2: 20 mmol/L — ABNORMAL LOW (ref 22–32)
Calcium: 8.7 mg/dL — ABNORMAL LOW (ref 8.9–10.3)
Chloride: 108 mmol/L (ref 98–111)
Creatinine, Ser: 1.05 mg/dL — ABNORMAL HIGH (ref 0.44–1.00)
GFR, Estimated: 54 mL/min — ABNORMAL LOW (ref >60)
Glucose, Bld: 128 mg/dL — ABNORMAL HIGH (ref 70–99)
Potassium: 3.5 mmol/L (ref 3.5–5.1)
Sodium: 143 mmol/L (ref 135–145)

## 2022-04-15 MED ORDER — HALOPERIDOL LACTATE 2 MG/ML PO CONC: 2.0000 mg | Freq: Four times a day (QID) | ORAL | Status: DC | PRN

## 2022-04-15 MED ORDER — ORAL CARE MOUTH RINSE: 15.0000 mL | OROMUCOSAL | Status: DC | PRN

## 2022-04-15 MED ORDER — SODIUM CHLORIDE 0.9 % IV SOLN
1.0000 g | INTRAVENOUS | Status: DC
  Administered 2022-04-15: 1 g via INTRAVENOUS
  Filled 2022-04-15 (×2): qty 10

## 2022-04-15 MED ORDER — HALOPERIDOL LACTATE 5 MG/ML IJ SOLN: 2.0000 mg | Freq: Four times a day (QID) | INTRAMUSCULAR | Status: DC | PRN

## 2022-04-15 MED ORDER — POLYVINYL ALCOHOL 1.4 % OP SOLN: 1.0000 [drp] | Freq: Four times a day (QID) | OPHTHALMIC | Status: DC | PRN

## 2022-04-15 MED ORDER — LORAZEPAM 2 MG/ML IJ SOLN
1.0000 mg | INTRAMUSCULAR | Status: DC | PRN
  Administered 2022-04-15: 1 mg via INTRAVENOUS
  Filled 2022-04-15: qty 1

## 2022-04-15 MED ORDER — OXYCODONE HCL 5 MG/5ML PO SOLN: 5.0000 mg | ORAL | Status: DC | PRN

## 2022-04-15 MED ORDER — GLYCOPYRROLATE 1 MG PO TABS: 1.0000 mg | ORAL_TABLET | ORAL | Status: DC | PRN

## 2022-04-15 MED ORDER — HALOPERIDOL 1 MG PO TABS: 2.0000 mg | ORAL_TABLET | Freq: Four times a day (QID) | ORAL | Status: DC | PRN

## 2022-04-15 MED ORDER — BIOTENE DRY MOUTH MT LIQD
15.0000 mL | Freq: Two times a day (BID) | OROMUCOSAL | Status: DC
  Administered 2022-04-15: 15 mL via TOPICAL

## 2022-04-15 MED ORDER — DIPHENHYDRAMINE HCL 50 MG/ML IJ SOLN: 12.5000 mg | INTRAMUSCULAR | Status: DC | PRN

## 2022-04-15 MED ORDER — GLYCOPYRROLATE 0.2 MG/ML IJ SOLN: 0.2000 mg | INTRAMUSCULAR | Status: DC | PRN

## 2022-04-15 MED ORDER — LORAZEPAM 1 MG PO TABS: 1.0000 mg | ORAL_TABLET | ORAL | Status: DC | PRN

## 2022-04-15 MED ORDER — LORAZEPAM 2 MG/ML PO CONC
1.0000 mg | ORAL | Status: DC | PRN
  Filled 2022-04-15: qty 0.5

## 2022-04-15 NOTE — Plan of Care (Signed)
  Problem: Metabolic: Goal: Ability to maintain appropriate glucose levels will improve Outcome: Progressing   Problem: Nutritional: Goal: Maintenance of adequate nutrition will improve Outcome: Not Progressing   Problem: Skin Integrity: Goal: Risk for impaired skin integrity will decrease Outcome: Progressing   Problem: Tissue Perfusion: Goal: Adequacy of tissue perfusion will improve Outcome: Progressing   Problem: Education: Goal: Knowledge of General Education information will improve Description: Including pain rating scale, medication(s)/side effects and non-pharmacologic comfort measures Outcome: Not Progressing   Problem: Health Behavior/Discharge Planning: Goal: Ability to manage health-related needs will improve Outcome: Not Progressing   Problem: Nutrition: Goal: Adequate nutrition will be maintained Outcome: Not Progressing   Problem: Coping: Goal: Level of anxiety will decrease Outcome: Progressing   Problem: Safety: Goal: Ability to remain free from injury will improve Outcome: Progressing

## 2022-04-15 NOTE — Plan of Care (Signed)
  Problem: Coping: Goal: Ability to adjust to condition or change in health will improve Outcome: Progressing   Problem: Health Behavior/Discharge Planning: Goal: Ability to manage health-related needs will improve Outcome: Progressing   Problem: Metabolic: Goal: Ability to maintain appropriate glucose levels will improve Outcome: Progressing   Problem: Nutritional: Goal: Maintenance of adequate nutrition will improve Outcome: Progressing   Problem: Skin Integrity: Goal: Risk for impaired skin integrity will decrease Outcome: Progressing   Problem: Tissue Perfusion: Goal: Adequacy of tissue perfusion will improve Outcome: Progressing

## 2022-04-15 NOTE — Progress Notes (Signed)
Speech Language Pathology Treatment: Dysphagia  Patient Details Name: Susan Patel MRN: [id number] DOB: 1940-09-27 Today's Date: 04/15/2022 Time: 5945-8592 SLP Time Calculation (min) (ACUTE ONLY): 25 min  Assessment / Plan / Recommendation Clinical Impression  Pt was seen for dysphagia treatment with her husband present. Upon SLP's arrival, pt was seated reclined with cup of nectar thick liquids in hand and coughing. Pt's swallow function appears notably worse than during the MBS on 11/28. She demonstrated reduced bolus awareness and absent bolus manipulation with puree. Pt swallowed nectar and honey thick liquids via cup (self-fed) and spoon, but a significant oral and/or pharyngeal delay is suspected considering time of bolus presentation versus that of hyolaryngeal movement. Considering pt's open-mouth posture after taking multiple boluses and limited lingual movement, SLP suspects frequent instances of premature spillage of liquids without active A-P transport. Pt consistently demonstrated coughing with all liquids even when boluses were reduced and verbal prompts were provided to improve timeliness. Multiple swallows were noted, suggesting pharyngeal residue. Pt's husband was educated regarding the pt's performance and SLP's concern regarding p.o. intake. However, he was advised that p.o. intake (e.g., pleasure feeds) may be initiated if GOC become that of comfort or if pt's family decides to initiate a p.o. diet with known aspiration risk. SLP will continue to follow pt pending [place].    HPI HPI: Pt is a 81 y.o. F who presents with chest pain and shortness of breath. Found with greatly elevated troponins; chest x-ray showed fluid overload. MRI brain 11/29: Small area of acute infarct right pons. Pt started on heparin infusion and given IV Lasix. Significant PMH: coronary artery disease, type 2 diabetes, hypertension, previous stroke with residual dysarthria and aphasia, COPD on 3 to 4 L of oxygen at  home. She was NPO for procedure and then when RN giving patient PO's with her dinner meal tray, she held food in mouth with little to no chewing or transit of boluses and coughing with liquids so she was kept NPO awaiting SLP evaluation.      SLP Plan    Continue current plan      Recommendations for follow up therapy are one component of a multi-disciplinary discharge planning process, led by the attending physician.  Recommendations may be updated based on patient status, additional functional criteria and insurance authorization.    Recommendations  NPO pending GOC decision Medication Administration: Via alternative means                Oral Care Recommendations: Oral care BID;Staff/trained caregiver to provide oral care Follow Up Recommendations: Skilled nursing-short term rehab (<3 hours/day) SLP Visit Diagnosis: Dysphagia, unspecified (R13.10)         Manami Tutor I. Hardin Negus, [place], [place] Office number [phone]   Horton Marshall  04/15/2022, 12:31 PM

## 2022-04-15 NOTE — Progress Notes (Signed)
Mobility Specialist Progress Note    04/15/22 1421  Mobility  Activity Transferred from chair to bed  Level of Assistance Maximum assist, patient does 25-49%  Assistive Device Front wheel walker  Distance Ambulated (ft) 2 ft  Activity Response Tolerated fair  Mobility Referral Yes  $Mobility charge 1 Mobility   Pre-Mobility: 75 HR, 95% SpO2 Post-Mobility: 73 HR, 94% SpO2  Pt received leaning left holding onto bed. ModA to stand. During pivot began to lean right requiring maxA to recover. Returned to supine with call bell in reach and husband present. RN aware.   Hildred Alamin Mobility Specialist  Please Psychologist, sport and exercise or Rehab Office at [phone]

## 2022-04-15 NOTE — TOC Progression Note (Addendum)
Transition of Care [facility]) - Progression Note    Patient Details  Name: Susan Patel MRN: [id number] Date of Birth: March 05, 1941  [facility]) CM/SW Contact  Jacalyn Lefevre Edson Snowball, RN Phone Number: 04/15/2022, 3:44 PM  Clinical Narrative:     Disposition has changed to home with hospice. Spoke to patient's husband Susan Patel , offered choice he prefers [employer] [facility] of [place]). NCM called [facility] spoke to [place] . Marchia Meiers will begin to work on referral and call [place].   Susan Patel reports patient has an adjustable bed. OT has recommended 3 in 1 , patient does not have 3 in 1 , [name] interested in receiving one ( hospice will order DME)  . Patient on room air   Patient will need ambulance transport home [name] , [name]   Patient will need signed DNR form on chart , palliative placed on on chart   Secure chatted team   Claiborne Billings with [place] aware   Expected Discharge Plan: [place] Barriers to Discharge: Continued Medical Work up  Expected Discharge Plan and Services Expected Discharge Plan: [place]   Discharge Planning Services: CM Consult Post Acute Care Choice: [place] arrangements for the past 2 months: Single Family Home                 DME Arranged: N/A DME Agency: NA       HH Arranged: PT, OT HH Agency: [place] Date [place]: 04/13/22 Time HH Agency Contacted: 1104 Representative spoke with at [place]: [place] Determinants of Health ([place]) Interventions    Readmission Risk Interventions     No data to display

## 2022-04-15 NOTE — Progress Notes (Signed)
Occupational Therapy Treatment Patient Details Name: Susan Patel MRN: [id number] DOB: 04/23/1941 Today's Date: 04/15/2022   History of present illness Pt is a 81 y.o. F who presents with chest pain and shortness of breath. Found with greatly elevated troponins; chest x-ray showed fluid overload. Pt started on heparin infusion and given IV Lasix. Significant PMH: coronary artery disease, type 2 diabetes, hypertension, previous stroke with residual dysarthria and aphasia, COPD on 3 to 4 L of oxygen at home.   OT comments  Patient asleep upon entry but able to arouse. Patient was mod/max assist to get to EOB with patient attempting to move legs to EOB but required assistance to complete and assistance with trunk. Patient performed light grooming with washing face and attempted to drink with assistance to hold cup and patient requiring cueing to swallow. Patient performed reaching tasks while seated on EOB to challenge balance.  Patient was max assist to stand from EOB and to side step towards [facility] with assistance for weight shifting. Patient became more lethargic and assisted back to supine and left in chair position. Acute OT to continue to follow.    Recommendations for follow up therapy are one component of a multi-disciplinary discharge planning process, led by the attending physician.  Recommendations may be updated based on patient status, additional functional criteria and insurance authorization.    Follow Up Recommendations  Home health OT     Assistance Recommended at Discharge Frequent or constant Supervision/Assistance  Patient can return home with the following  A little help with walking and/or transfers;A little help with bathing/dressing/bathroom;Assistance with cooking/housework;Direct supervision/assist for financial management;Direct supervision/assist for medications management;Assist for transportation;Help with stairs or ramp for entrance   Equipment Recommendations  Other  (comment) (TBD)    Recommendations for Other Services      Precautions / Restrictions Precautions Precautions: Fall;Other (comment) Precaution Comments: expressive aphasia Restrictions Weight Bearing Restrictions: No       Mobility Bed Mobility Overal bed mobility: Needs Assistance Bed Mobility: Supine to Sit, Sit to Supine     Supine to sit: Mod assist, Max assist Sit to supine: Max assist   General bed mobility comments: attempted to move LEs off side of bed and required assistance to complete. Required assistance with trunk and scooting hips    Transfers Overall transfer level: Needs assistance Equipment used: None Transfers: Sit to/from Stand Sit to Stand: Max assist           General transfer comment: Stood from EOB with max assist with face to face technique and side stepped towards HOB, right, with assistance to weight shift.     Balance Overall balance assessment: Needs assistance Sitting-balance support: Feet supported Sitting balance-Leahy Scale: Poor Sitting balance - Comments: min guard to supervision for sitting balance   Standing balance support: Bilateral upper extremity supported Standing balance-Leahy Scale: Poor Standing balance comment: dependent on therapist                           ADL either performed or assessed with clinical judgement   ADL Overall ADL's : Needs assistance/impaired Eating/Feeding: Moderate assistance Eating/Feeding Details (indicate cue type and reason): assistance to hold cup with patient demonstrating pocketing Grooming: Minimal assistance;Sitting Grooming Details (indicate cue type and reason): wash face with tactile cues to begin  General ADL Comments: increased time and cues needed    Extremity/Trunk Assessment              Vision       Perception     Praxis      Cognition Arousal/Alertness: Lethargic Behavior During Therapy: Flat  affect Overall Cognitive Status: History of cognitive impairments - at baseline                                 General Comments: asleep upon entry and able to arouse. Became more lethargic at end of session        Exercises      Shoulder Instructions       General Comments performed reaching tasks seated on EOB to address balance    Pertinent Vitals/ Pain       Pain Assessment Pain Assessment: Faces Faces Pain Scale: No hurt Pain Intervention(s): Monitored during session  Home Living                                          Prior Functioning/Environment              Frequency  Min 2X/week        Progress Toward Goals  OT Goals(current goals can now be found in the care plan section)  Progress towards OT goals: Progressing toward goals  Acute Rehab OT Goals OT Goal Formulation: Patient unable to participate in goal setting Time For Goal Achievement: 04/26/22 Potential to Achieve Goals: Good ADL Goals Pt Will Perform Grooming: with supervision;sitting Pt Will Perform Upper Body Dressing: sitting Pt Will Perform Lower Body Dressing: with supervision;sit to/from stand Pt Will Transfer to Toilet: with supervision;ambulating  Plan Discharge plan remains appropriate    Co-evaluation                 AM-PAC OT "6 Clicks" Daily Activity     Outcome Measure   Help from another person eating meals?: A Lot Help from another person taking care of personal grooming?: A Little Help from another person toileting, which includes using toliet, bedpan, or urinal?: A Lot Help from another person bathing (including washing, rinsing, drying)?: A Lot Help from another person to put on and taking off regular upper body clothing?: A Lot Help from another person to put on and taking off regular lower body clothing?: A Lot 6 Click Score: 13    End of Session Equipment Utilized During Treatment: Oxygen  OT Visit Diagnosis:  Unsteadiness on feet (R26.81);Other abnormalities of gait and mobility (R26.89);Muscle weakness (generalized) (M62.81);History of falling (Z91.81)   Activity Tolerance Patient limited by lethargy   Patient Left in bed;with call bell/phone within reach;with nursing/sitter in room   Nurse Communication Mobility status        Time: 5093-2671 OT Time Calculation (min): 21 min  Charges: OT General Charges $OT Visit: 1 Visit OT Treatments $Therapeutic Activity: 8-22 mins  Lodema Hong, [place]  Office [phone]   Trixie Dredge 04/15/2022, 9:43 AM

## 2022-04-15 NOTE — Progress Notes (Signed)
PROGRESS NOTE    Susan Patel  ZOX:[id number] DOB: 10-24-1940 DOA: 04/11/2022 PCP: Dettinger, Fransisca Kaufmann, MD    Brief Narrative:  81 year old with history of coronary artery disease, type 2 diabetes, hypertension, previous stroke with residual dysarthria and aphasia, COPD on 3 to 4 L of oxygen that she uses at night at home presented to the emergency room with chest pain and shortness of breath for 1 day.  On arrival to emergency room she was found with greatly elevated troponins.  Chest x-ray showed fluid overload.  Patient was started on heparin infusion, given IV Lasix and transferred to [facility] for further cardiology care. 11/27, underwent left heart cath and right heart cath and found to have extensive multivessel disease.  Ejection fraction 25 to 30%.  Conservative management advised. Patient continues to remain dysarthric with difficulty taking medications. 11/29, remained more lethargic.  Pocketing food.  Not eating adequate. MRI showed very tiny pontine stroke.  UA was abnormal.  Assessment & Plan:   Non-STEMI New bundle branch block Acute on chronic systolic and diastolic congestive heart failure Acute on chronic hypoxemic respiratory failure  Underwent cardiac cath with findings above.  She is not a CABG candidate due to severe frailty and debility.  Currently remains on aspirin, Plavix, Crestor, Coreg, Aldactone and Imdur however unsafe to take medications at time. Keep on oxygen to keep saturation more than 90%.   See goal of care discussion below.  Type 2 diabetes, well controlled: On Trulicity and Lantus at home.  On sliding scale insulin.  Fairly stable.  Essential hypertension: Blood pressures fairly controlled on nitrates and carvedilol.  Added Aldactone.  COPD with nocturnal hypoxemia: Oxygen as above.  Fairly controlled symptoms on nebulizer therapy. Patient may need additional oxygen at daytime.    Altered mental status, worsening dysarthria and  aphasia: Patient had fluctuating mental status, however started having more difficulties with swallowing pills and remained uncomfortable. Goal of care discussion 11/29, see IPAL note 11/29. MRI with small pontine stroke less likely causing current symptoms but might have aggravated dysarthria. Urinalysis was abnormal, cultures drawn and started on IV Rocephin. Remains clinically dry, on maintenance IV fluids despite history of congestive heart failure. Patient unable to be treated with heart failure regimen, severe cardiovascular morbidities. DNR/DNI confirmed. Husband, family willing to talk to palliative care team.  Family may also agree on hospice level of care. Cardiology agreed hospice discussions.  DVT prophylaxis: enoxaparin (LOVENOX) injection 40 mg Start: 04/13/22 0800Heparin infusion   Code Status: DNR Family Communication: Husband at the bedside 11/29, husband on the phone 11/30. Disposition Plan: Status is: Inpatient Remains inpatient appropriate because: Non-STEMI, unable to swallow safely to eat.  Failure to thrive.     Consultants:  Cardiology Palliative care  Procedures:  None  Antimicrobials:  Rocephin 11/30---   Subjective: Patient examined in the morning rounds.  She was more alert than usual and responded to simple questions.  Bedside nurse was struggling to convince her to swallow her medications.  Still suctioning medications out of her mouth.  Objective: Vitals:   04/14/22 2303 04/15/22 0309 04/15/22 0733 04/15/22 1048  BP: 135/83 (!) 144/56 (!) 146/75 (!) 150/81  Pulse: 90 80 79 78  Resp: (!) 24 (!) 25 (!) 22 19  Temp: 99 F (37.2 C) 98.1 F (36.7 C) 97.8 F (36.6 C) 97.8 F (36.6 C)  TempSrc: Axillary Axillary Oral Oral  SpO2: 92% 100% 93% 94%  Weight:  80.6 kg    Height:  Intake/Output Summary (Last 24 hours) at 04/15/2022 1302 Last data filed at 04/15/2022 0733 Gross per 24 hour  Intake 518.94 ml  Output 30 ml  Net 488.94 ml    Filed Weights   04/13/22 0300 04/14/22 0236 04/15/22 0309  Weight: 82 kg 81 kg 80.6 kg    Examination:  General exam: Appears anxious.  Frail.  Debilitated.  Sick looking. Dysarthric but able to answer simple questions.  Able to stay awake today. Respiratory system: Clear to auscultation. Respiratory effort normal.  Conducted upper airway sounds. Cardiovascular system: S1 & S2 heard, RRR. No JVD, murmurs, rubs, gallops or clicks. No pedal edema. Gastrointestinal system: Abdomen is nondistended, soft and nontender. No organomegaly or masses felt. Normal bowel sounds heard. Central nervous system: Dysarthric.  Difficult to understand words.  Alert and awake today.  Moves all extremities but very lethargic and generalized weakness.   Data Reviewed: I have personally reviewed following labs and imaging studies  CBC: Recent Labs  Lab 04/11/22 0245 04/12/22 0029 04/12/22 1815 04/12/22 1817 04/13/22 0028  WBC 8.8 8.9  --   --  7.9  NEUTROABS 6.0  --   --   --   --   HGB 13.7 14.3 13.9 13.9 13.3  HCT 41.4 43.3 41.0 41.0 40.6  MCV 88.3 87.7  --   --  88.3  PLT 222 219  --   --  672   Basic Metabolic Panel: Recent Labs  Lab 04/11/22 0245 04/12/22 0029 04/12/22 0815 04/12/22 1815 04/12/22 1817 04/13/22 0028 04/14/22 0037 04/15/22 0052  NA 140 140  --  141 140 139 143 143  K 3.7 3.1*  --  3.4* 3.4* 3.4* 3.8 3.5  CL 108 100  --   --   --  102 106 108  CO2 24 26  --   --   --  26 25 20*  GLUCOSE 125* 142*  --   --   --  109* 125* 128*  BUN 21 14  --   --   --  '23 23 20  '$ CREATININE 0.80 1.04*  --   --   --  1.13* 1.08* 1.05*  CALCIUM 8.5* 8.5*  --   --   --  8.7* 8.8* 8.7*  MG  --   --  2.1  --   --  2.0  --   --    GFR: Estimated Creatinine Clearance: 41.8 mL/min (A) (by C-G formula based on SCr of 1.05 mg/dL (H)). Liver Function Tests: No results for input(s): "AST", "ALT", "ALKPHOS", "BILITOT", "PROT", "ALBUMIN" in the last 168 hours. No results for input(s):  "LIPASE", "AMYLASE" in the last 168 hours. No results for input(s): "AMMONIA" in the last 168 hours. Coagulation Profile: No results for input(s): "INR", "PROTIME" in the last 168 hours. Cardiac Enzymes: No results for input(s): "CKTOTAL", "CKMB", "CKMBINDEX", "TROPONINI" in the last 168 hours. BNP (last 3 results) No results for input(s): "PROBNP" in the last 8760 hours. HbA1C: No results for input(s): "HGBA1C" in the last 72 hours.  CBG: Recent Labs  Lab 04/14/22 1113 04/14/22 1631 04/14/22 2102 04/15/22 0607 04/15/22 1136  GLUCAP 131* 185* 130* 126* 183*   Lipid Profile: No results for input(s): "CHOL", "HDL", "LDLCALC", "TRIG", "CHOLHDL", "LDLDIRECT" in the last 72 hours. Thyroid Function Tests: No results for input(s): "TSH", "T4TOTAL", "FREET4", "T3FREE", "THYROIDAB" in the last 72 hours. Anemia Panel: No results for input(s): "VITAMINB12", "FOLATE", "FERRITIN", "TIBC", "IRON", "RETICCTPCT" in the last 72 hours. Sepsis Labs: No  results for input(s): "PROCALCITON", "LATICACIDVEN" in the last 168 hours.  Recent Results (from the past 240 hour(s))  MRSA Next Gen by PCR, Nasal     Status: None   Collection Time: 04/11/22  7:34 PM   Specimen: Nasal Mucosa; Nasal Swab  Result Value Ref Range Status   MRSA by PCR Next Gen NOT DETECTED NOT DETECTED Final    Comment: (NOTE) The GeneXpert MRSA Assay (FDA approved for NASAL specimens only), is one component of a comprehensive MRSA colonization surveillance program. It is not intended to diagnose MRSA infection nor to guide or monitor treatment for MRSA infections. Test performance is not FDA approved in patients less than 65 years old. Performed at [facility], [address], [place], [place] [address]          Radiology Studies: MR BRAIN WO CONTRAST  Result Date: 04/14/2022 CLINICAL DATA:  Mental status change. History of stroke. Persistent aphasia and dysarthria. Rule out new stroke. EXAM: MRI HEAD WITHOUT  CONTRAST TECHNIQUE: Multiplanar, multiecho pulse sequences of the brain and surrounding structures were obtained without intravenous contrast. COMPARISON:  MRI head 08/20/2018 FINDINGS: Brain: Small area of restricted diffusion in the right pons compatible with acute infarct. Moderate to advanced generalized atrophy. Ventricular enlargement due to atrophy. Extensive white matter hyperintensity on T2 and FLAIR compatible with chronic microvascular ischemia. Chronic infarcts in the basal ganglia bilaterally. Small chronic infarct left thalamus. Chronic infarct in the anterior genu corpus callosum. Negative for hemorrhage or mass. Vascular: Normal arterial flow voids. Skull and upper cervical spine: Negative Sinuses/Orbits: Mild mucosal edema in the ethmoid sinuses. Bilateral cataract extraction. Small left mastoid effusion Other: None IMPRESSION: 1. Small area of acute infarct right pons. 2. Moderate to advanced atrophy. Extensive chronic microvascular ischemic change in the white matter and basal ganglia bilaterally. Electronically Signed   By: Franchot Gallo M.D.   On: 04/14/2022 18:56   DG Swallowing Func-Speech Pathology  Result Date: 04/13/2022 Table formatting from the original result was not included. Objective Swallowing Evaluation: Type of Study: MBS-Modified Barium Swallow Study  Patient Details Name: Susan Patel MRN: [id number] Date of Birth: Jun 24, 1940 Today's Date: 04/13/2022 Time: SLP Start Time (ACUTE ONLY): 1545 -SLP Stop Time (ACUTE ONLY): 1600 SLP Time Calculation (min) (ACUTE ONLY): 15 min Past Medical History: Past Medical History: Diagnosis Date  Arthritis   'all over"  Asthma   CAD (coronary artery disease)   Multivessel CAD, not a candidate for CABG, managing medically.  Chronic pain   COPD (chronic obstructive pulmonary disease) (HCC)   O2 per [place] at nights   Depression   Diabetes mellitus without complication (HCC)   borderline- , states she was on med., but MD told her "everything is  under control so I threw the bottle away"  Fibromyalgia   GERD (gastroesophageal reflux disease)   Hypertension   Neuromuscular disorder ([place])   parkinson, neuropathy- both feet & hands.  Stroke [facility])   residual dysarthria Past Surgical History: Past Surgical History: Procedure Laterality Date  ABDOMINAL HYSTERECTOMY    ANTERIOR CERVICAL DECOMP/DISCECTOMY FUSION N/A 07/10/2015  Procedure: Cervical five-six, Cervical six-seven anterior cervical decompression with fusion plating and bonegraft;  Surgeon: Jovita Gamma, MD;  Location: [place] [facility];  Service: Neurosurgery;  Laterality: N/A;  APPENDECTOMY    BIOPSY EYE MUSCLE  03/20/2016  biopsy vessel to right eye due to swelling  EYE SURGERY Bilateral   cataracts removed, /w "cyrstal lenses"   LEFT HEART CATH AND CORONARY ANGIOGRAPHY N/A 05/23/2018  Procedure: LEFT HEART CATH AND CORONARY ANGIOGRAPHY;  Surgeon: Troy Sine, MD;  Location: [place] [facility];  Service: Cardiovascular;  Laterality: N/A;  RIGHT HEART CATH AND CORONARY ANGIOGRAPHY N/A 04/12/2022  Procedure: RIGHT HEART CATH AND CORONARY ANGIOGRAPHY;  Surgeon: Nelva Bush, MD;  Location: [place] [facility];  Service: Cardiovascular;  Laterality: N/A;  SHOULDER ARTHROSCOPY Right   x2   RCR- spurs removed  HPI: Pt is a 81 y.o. F who presents with chest pain and shortness of breath. Found with greatly elevated troponins; chest x-ray showed fluid overload. Pt started on heparin infusion and given IV Lasix. Significant PMH: coronary artery disease, type 2 diabetes, hypertension, previous stroke with residual dysarthria and aphasia, COPD on 3 to 4 L of oxygen at home. She was NPO for procedure and then when RN giving patient PO's with her dinner meal tray, she held food in mouth with little to no chewing or transit of boluses and coughing with liquids so she was kept NPO awaiting SLP evaluation.  Subjective: awake, alert and cooperative  Recommendations for follow up therapy are one component of a  multi-disciplinary discharge planning process, led by the attending physician.  Recommendations may be updated based on patient status, additional functional criteria and insurance authorization. Assessment / Plan / Recommendation   04/13/2022   4:20 PM Clinical Impressions Clinical Impression Patient exhibits a moderately impaired oral phase of swallow and a mild-moderately impaired pharyngeal phase of swallow. During oral phase, patient with decreased anterior to posterior transit of liquids and solids, prolonged mastication and holding of puree solids as well as thin and nectar thick liquids. During mastication of mechanical soft solids, patient with premature spillage of tail of bolus into vallecular sinus while patient still masticating the rest of bolus. No oral residuals observed post swallows. During pharyngeal phase of swallow, patient with swallow inititiation delays at level of pyriform sinus with thin liquids via cup sips and nectar thick liquids via straw sips and delays at level of vallecular sinus with nectar thick liquids via cup sips and thin liquids via spoon sips. Patient exhibited flash penetration with cup and straw sips of nectar thick liquids(PAS 2) with penetration events being shallow in depth and fully exiting laryngeal vestibule. Silent aspiration occured on approximately 75% of swallows with thin liquids via cup sips (even when small, controlled sips). Aspiration events occured during the swallow and were trace in amount but patient without cough response or indication that she was sensate to aspirate. Puree solids, mechanical soft solids transited pharyngeally without difficulty and without residuals; trace vallecular and pyriform residuals observed with nectar thick liquids. SLP is recommending to initiate PO diet of Dys 2 (minced) solids and nectar thick liquids and will follow patient for toleration and ability to advance. SLP Visit Diagnosis Dysphagia, oropharyngeal phase (R13.12)  Impact on safety and function Mild aspiration risk;Moderate aspiration risk     04/13/2022   4:20 PM Treatment Recommendations Treatment Recommendations Therapy as outlined in treatment plan below     04/13/2022   4:30 PM Prognosis Prognosis for Safe Diet Advancement Fair Barriers to Reach Goals Time post onset;Severity of deficits Barriers/Prognosis Comment patient with h/o dysphagia requiring modified diet   04/13/2022   4:20 PM Diet Recommendations SLP Diet Recommendations Dysphagia 2 (Fine chop) solids;Nectar thick liquid Liquid Administration via Cup;Straw Medication Administration Whole meds with puree Compensations Slow rate;Small sips/bites Postural Changes Seated upright at 90 degrees     04/13/2022   4:20 PM Other Recommendations Oral  Care Recommendations Oral care BID Follow Up Recommendations Skilled nursing-short term rehab (<3 hours/day) Functional Status Assessment Patient has had a recent decline in their functional status and demonstrates the ability to make significant improvements in function in a reasonable and predictable amount of time.   04/13/2022   4:20 PM Frequency and Duration  Speech Therapy Frequency (ACUTE ONLY) min 2x/week Treatment Duration 1 week     04/13/2022   4:09 PM Oral Phase Oral Phase Impaired Oral - Nectar Cup Weak lingual manipulation;Reduced posterior propulsion;Holding of bolus;Delayed oral transit;Piecemeal swallowing Oral - Nectar Straw Delayed oral transit;Holding of bolus;Reduced posterior propulsion;Weak lingual manipulation Oral - Thin Teaspoon Delayed oral transit;Weak lingual manipulation;Reduced posterior propulsion;Holding of bolus Oral - Thin Cup Holding of bolus;Delayed oral transit;Reduced posterior propulsion;Weak lingual manipulation Oral - Puree Delayed oral transit;Weak lingual manipulation;Reduced posterior propulsion;Piecemeal swallowing Oral - Mech Soft Delayed oral transit;Piecemeal swallowing;Impaired mastication;Reduced posterior propulsion     04/13/2022   4:12 PM Pharyngeal Phase Pharyngeal Phase Impaired Pharyngeal- Nectar Cup Delayed swallow initiation-vallecula;Penetration/Aspiration during swallow;Pharyngeal residue - valleculae;Pharyngeal residue - pyriform Pharyngeal Material enters airway, remains ABOVE vocal cords then ejected out Pharyngeal- Nectar Straw Delayed swallow initiation-pyriform sinuses;Pharyngeal residue - valleculae;Pharyngeal residue - pyriform Pharyngeal- Thin Teaspoon Delayed swallow initiation-pyriform sinuses;Penetration/Aspiration during swallow Pharyngeal Material enters airway, remains ABOVE vocal cords then ejected out Pharyngeal- Thin Cup Trace aspiration;Penetration/Aspiration during swallow;Reduced airway/laryngeal closure;Delayed swallow initiation-pyriform sinuses Pharyngeal Material enters airway, passes BELOW cords without attempt by patient to eject out (silent aspiration)    04/13/2022   4:19 PM Cervical Esophageal Phase  Cervical Esophageal Phase [facility] Sonia Baller, MA, CCC-SLP Speech Therapy                          Scheduled Meds:  aspirin EC  81 mg Oral Daily   carvedilol  6.25 mg Oral BID WC   clopidogrel  75 mg Oral Q breakfast   enoxaparin (LOVENOX) injection  40 mg Subcutaneous Q24H   insulin aspart  0-5 Units Subcutaneous QHS   insulin aspart  0-9 Units Subcutaneous TID WC   isosorbide mononitrate  30 mg Oral Daily   montelukast  10 mg Oral Daily   pantoprazole  40 mg Oral Daily   rosuvastatin  40 mg Oral Daily   sodium chloride flush  3 mL Intravenous Q12H   spironolactone  25 mg Oral Daily   Continuous Infusions:  sodium chloride     sodium chloride 50 mL/hr at 04/15/22 0300   cefTRIAXone (ROCEPHIN)  IV 1 g (04/15/22 0823)     LOS: 4 days    Time spent: 35 minutes    Barb Merino, MD [facility] Pager [phone]

## 2022-04-15 NOTE — Progress Notes (Signed)
SLP Cancellation Note  Patient Details Name: Susan Patel MRN: [id number] DOB: Jan 08, 1941   Cancelled treatment:       Reason Eval/Treat Not Completed: Other (comment) (Pt's RN advised that the pt's husband has decided on comfort care and SLP orders cancelled by palliative medicine)  Tobie Poet I. Hardin Negus, [place], [place] Office number [phone]  Horton Marshall 04/15/2022, 3:47 PM

## 2022-04-15 NOTE — Consult Note (Signed)
   [facility] Inpatient Consult   04/15/2022  Susan Patel [birth year] [id number]   [place] Organization [ACO] Patient:  Susan Patel [facility]   Primary Care Provider: Dettinger, Fransisca Kaufmann, MD with [place],  is an [place] provider with a Chronic Care Management pharmacist and program prior to admission noted and is listed for the [facility] follow up needs     Patient screened for potential [place] Management service needs for post hospital transition for readmission prevention needs.  Review of patient's medical record reveals patient is followed by the [facility] team and palliative consult noted, noted review of PT/OT evals as well.   Plan:  Continue to follow progress and disposition to assess for post hospital care management needs.  Referral request pending disposition with needs.   For questions contact:    Natividad Brood, RN BSN [place]  [phone] business mobile phone Toll free office [phone]  *[place]  [phone] Fax number: [phone] [email or web address]

## 2022-04-15 NOTE — Progress Notes (Signed)
Mobility Specialist Progress Note    04/15/22 1219  Mobility  Activity Transferred from bed to chair  Level of Assistance Moderate assist, patient does 50-74%  Assistive Device Front wheel walker  Distance Ambulated (ft) 2 ft  Activity Response Tolerated fair  Mobility Referral Yes  $Mobility charge 1 Mobility   Pre-Mobility: 80 HR, 93% SpO2 Post-Mobility: 77 HR, 92% SpO2  Pt received and agreeable. Responsive to cues for bed mobility and standing. Left with call bell in reach, chair alarm on and husband present. RN notified.   Hildred Alamin Mobility Specialist  Please Psychologist, sport and exercise or Rehab Office at [phone]

## 2022-04-15 NOTE — Progress Notes (Signed)
Heart Failure Nurse Navigator Progress Note  Following this hospitalization to assess for HV TOC readiness. Palliative consulted this hospitalization, will await to see further plan of care before completing evaluation.   Pricilla Holm, MSN, RN Heart Failure Nurse Navigator

## 2022-04-15 NOTE — Consult Note (Signed)
Consultation Note Date: 04/15/2022   Patient Name: Susan Patel  DOB: 24-Feb-1941  MRN: [id number]  Age / Sex: 81 y.o., female  PCP: Dettinger, Fransisca Kaufmann, MD Referring Physician: Barb Merino, MD  Reason for Consultation: Establishing goals of care  HPI/Patient Profile: 81 y.o. female  with past medical history of  coronary artery disease diabetes, hypertension, previous stroke with residual dysarthria, and COPD on oxygen at night presented to hospital with complaints of chest pain and shortness of breath. Patient was admitted on 04/11/2022 with NSTEMI, new onset acute CHF, acute respiratory failure with hypoxia.   11/27: underwent left heart cath and right heart cath and found to have extensive multivessel disease.  Ejection fraction 25 to 30%.  Conservative management advised - not a candidate for advanced therapies. Patient continues to remain dysarthric with difficulty taking medications. 11/29: remained more lethargic.  Pocketing food.  Not eating adequately. MRI showed very tiny pontine stroke.  UA was abnormal.  Clinical Assessment and Goals of Care: I have reviewed medical records including EPIC notes, labs, and imaging. Received report from primary RN - no acute concerns. RN reports patient continues to pocket food, swallowing issues, lethargy wax/wanes. RN reports patient is at times able to follow commands - was up to chair today with mobility team.   Martin Majestic to visit patient at bedside - husband/Wayne present. Patient was lying in bed intermittently awake and alert vs asleep. She is non-verbal but is able to nod yes/no seemingly appropriately when awake. No signs or non-verbal gestures of pain or discomfort noted. No respiratory distress, increased work of breathing, or secretions noted. Patient denies pain.  Met with patient and husband  to discuss diagnosis, prognosis, GOC, EOL wishes, disposition, and  options.  I introduced Palliative Medicine as specialized medical care for people living with serious illness. It focuses on providing relief from the symptoms and stress of a serious illness. The goal is to improve quality of life for both the patient and the family.  We discussed a brief life review of the patient as well as functional and nutritional status. Patent and her husband have been married 73 years - they had one daughter who is deceased and one living son. Patient worked as a Company secretary for many years. Susan Patel tells me "everyone loved her." She loved to sing, play the piano, and write songs. She has a support dog named Lorre Nick, whom she loves. Prior to hospitalization, patient was able to ambulate short distances with a walker; but Susan Patel does have a lift in the home to assist.   We discussed patient's current illness and what it means in the larger context of patient's on-going co-morbidities. Susan Patel has a clear understanding of patient's current acute medical situation - not a candidate for advanced cardiac therapies, failure to thrive with worsening dysphasia in context of baseline poor health. Natural disease trajectory and expectations at EOL were discussed. I attempted to elicit values and goals of care important to the patient. The difference between aggressive medical intervention and comfort care was considered in light of the patient's goals of care.   Reviewed option of feeding tube in context of underlying acute CHF, dysphasia with aspiration risk, high risk for rehospitalization. Susan Patel tells me he doesn't "want her to starve." Counseled on comfort feeds with known risk of aspiration/aspiration precautions as patient desires/tolerates. Patient indicates she wants an ice chip. After discussion, Susan Patel is not interested in pursuing artificial feeding/hydration. He tells me "I want her comfortable" and "I do not  want her coming back to the hospital." He inquires about next steps.  Provided  education and counseling at length on the philosophy and benefits of hospice care. Discussed that it offers a holistic approach to care in the setting of end-stage illness, and is about supporting the patient where they are allowing nature to take it's course. Discussed the hospice team includes RNs, physicians, social workers, and chaplains. They can provide personal care, support for the family, and help keep patient out of the hospital as well as assist with DME needs for home hospice. Education provided on the difference between home vs residential hospice. Susan Patel is most interested in home hospice option, requesting services with [facility]. He feels comfortable providing patient EOL care with hospice support. He has no DME needs. Susan Patel asks patient if she prefers to go home with hospice over residential option - she nods her head yes. He tells me "I will do anything to support her." Prognostication reviewed her his request.  We talked about transition to comfort measures in house and what that would entail inclusive of medications to control pain, dyspnea, agitation, nausea, and itching. We discussed stopping all unnecessary measures such as blood draws, needle sticks, oxygen, antibiotics, CBGs/insulin, cardiac monitoring, IVF, and frequent vital signs. Education provided that other non-pharmacological interventions would be utilized for holistic support and comfort such as spiritual support if requested, repositioning, music therapy, offering comfort feeds, and/or therapeutic listening. Susan Patel chooses for her transition to full comfort measures today in house.  Visit also consisted of discussions dealing with the complex and emotionally intense issues of symptom management and palliative care in the setting of serious and potentially life-threatening illness.   Discussed with patient/family the importance of continued conversation with each other and the medical providers regarding  overall plan of care and treatment options, ensuring decisions are within the context of the patient's values and GOCs.    Questions and concerns were addressed. The patient/family was encouraged to call with questions and/or concerns. PMT card was provided.   Primary Decision Maker: NEXT OF KIN - husband/Wayne [place]    SUMMARY OF RECOMMENDATIONS   Initiated full comfort measures Continue DNR/DNI as previously documented - durable DNR form completed and placed in shadow chart. Copy was made and will be scanned into Vynca/ACP tab Husband would like patient discharged home with hospice, requesting [facility] notified and consult placed Can likely plan for discharge tomorrow 12/1 pending hospice referral. Husband ok with discharge tomorrow Comfort feeds with known risk of aspiration and aspiration precautions Added orders for EOL symptom management and to reflect full comfort measures, as well as discontinued orders that were not focused on comfort Unrestricted visitation orders were placed per current [place] EOL visitation policy  Nursing to provide frequent assessments and administer PRN medications as clinically necessary to ensure EOL comfort PMT will continue to follow and support holistically  Symptom Management Discontinued oral/tablet medications as patient can no longer tolerate swallowing Continue CBG/insulin with goal to keep patient stable until she can return home with hospice Roxicodone liquid PRN pain/dyspnea/increased work of breathing/RR>25 Tylenol PRN pain/fever Biotin twice daily Benadryl PRN itching Robinul PRN secretions Haldol PRN agitation/delirium Ativan PRN anxiety/seizure/sleep/distress Zofran PRN nausea/vomiting Liquifilm Tears PRN dry eye   Code Status/Advance Care Planning: DNR  Palliative Prophylaxis:  Aspiration, Delirium Protocol, Eye Care, Oral Care, and Turn Reposition  Additional Recommendations  (Limitations, Scope, Preferences): Full Comfort Care  Psycho-social/Spiritual:  Desire for further  Chaplaincy support:no Created space and opportunity for patient and family to express thoughts and feelings regarding patient's current medical situation.  Emotional support and therapeutic listening provided.  Prognosis:  < 2 weeks  Discharge Planning: Home with Hospice      Primary Diagnoses: Present on Admission:  NSTEMI (non-ST elevated myocardial infarction) ([place])  Gastro-esophageal reflux disease with esophagitis  Type 2 diabetes mellitus with peripheral neuropathy (HCC)  Fibromyalgia  Chronic obstructive pulmonary disease (HCC)  Mixed hyperlipidemia  Essential hypertension  Coronary artery disease   I have reviewed the medical record, interviewed the patient and family, and examined the patient. The following aspects are pertinent.  Past Medical History:  Diagnosis Date   Arthritis    'all over"   Asthma    CAD (coronary artery disease)    Multivessel CAD, not a candidate for CABG, managing medically.   Chronic pain    COPD (chronic obstructive pulmonary disease) (HCC)    O2 per [place] at nights    Depression    Diabetes mellitus without complication (HCC)    borderline- , states she was on med., but MD told her "everything is under control so I threw the bottle away"   Fibromyalgia    GERD (gastroesophageal reflux disease)    Hypertension    Neuromuscular disorder ([place])    parkinson, neuropathy- both feet & hands.   Stroke [facility])    residual dysarthria   Social History   Socioeconomic History   Marital status: Married    Spouse name: wayne   Number of children: 2   Years of education: 16   Highest education level: Bachelor's degree (e.g., BA, AB, BS)  Occupational History   Occupation: retired    Comment: Automotive engineer  Tobacco Use   Smoking status: Never   Smokeless tobacco: Never  Vaping Use   Vaping Use: Never used  Substance and Sexual  Activity   Alcohol use: No   Drug use: No   Sexual activity: Yes  Other Topics Concern   Not on file  Social History Narrative   Lives home with her husband   Handicap accessible bathroom   She has had several strokes - left her with impaired balance, memory, speech   Her husband takes care of cooking, cleaning, driving, etc   Social Determinants of Health   Financial Resource Strain: Low Risk  (04/27/2021)   Overall Financial Resource Strain (CARDIA)    Difficulty of Paying Living Expenses: Not hard at all  Food Insecurity: No Food Insecurity (04/27/2021)   Hunger Vital Sign    Worried About Running Out of Food in the Last Year: Never true    [place] in the Last Year: Never true  Transportation Needs: No Transportation Needs (04/27/2021)   PRAPARE - Hydrologist (Medical): No    Lack of Transportation (Non-Medical): No  Physical Activity: Inactive (04/27/2021)   Exercise Vital Sign    Days of Exercise per Week: 0 days    Minutes of Exercise per Session: 0 min  Stress: No Stress Concern Present (04/27/2021)   Home    Feeling of Stress : Only a little  Social Connections: Socially Integrated (04/27/2021)   Social Connection and Isolation Panel [NHANES]    Frequency of Communication with Friends and Family: Twice a week    Frequency of Social Gatherings with Friends and Family: Twice a week    Attends Religious Services:  More than 4 times per year    Active Member of Clubs or Organizations: Yes    Attends Music therapist: More than 4 times per year    Marital Status: Married   Family History  Problem Relation Age of Onset   Heart disease Mother    Cancer Father    Heart disease Father    Arthritis Sister    Heart failure Sister    Cancer Brother    Thyroid cancer Son    Atrial fibrillation Son    Arthritis Sister    Scheduled Meds:  aspirin EC   81 mg Oral Daily   carvedilol  6.25 mg Oral BID WC   clopidogrel  75 mg Oral Q breakfast   enoxaparin (LOVENOX) injection  40 mg Subcutaneous Q24H   insulin aspart  0-5 Units Subcutaneous QHS   insulin aspart  0-9 Units Subcutaneous TID WC   isosorbide mononitrate  30 mg Oral Daily   montelukast  10 mg Oral Daily   pantoprazole  40 mg Oral Daily   rosuvastatin  40 mg Oral Daily   sodium chloride flush  3 mL Intravenous Q12H   spironolactone  25 mg Oral Daily   Continuous Infusions:  sodium chloride     sodium chloride 50 mL/hr at 04/15/22 0701   cefTRIAXone (ROCEPHIN)  IV 1 g (04/15/22 0823)   PRN Meds:.sodium chloride, acetaminophen **OR** acetaminophen, albuterol, nitroGLYCERIN, ondansetron **OR** ondansetron (ZOFRAN) IV, mouth rinse, sodium chloride flush, traZODone Medications Prior to Admission:  Prior to Admission medications   Medication Sig Start Date End Date Taking? Authorizing Provider  acetaminophen (TYLENOL) 325 MG tablet Take 2 tablets (650 mg total) by mouth every 6 (six) hours as needed. 02/23/18  Yes Varney Biles, MD  carvedilol (COREG) 12.5 MG tablet Take 1 tablet (12.5 mg total) by mouth 2 (two) times daily. 03/11/22  Yes Dettinger, Fransisca Kaufmann, MD  [facility] ASPIRIN LOW STRENGTH 81 MG tablet TAKE 1 TABLET BY MOUTH EVERY DAY Patient taking differently: Take 81 mg by mouth in the morning. 11/03/21  Yes Dettinger, Fransisca Kaufmann, MD  Cyanocobalamin (B-12 PO) Take 1 tablet by mouth in the morning.   Yes [provider]  Dulaglutide (TRULICITY) 4.17 EY/8.1KG SOPN Inject 0.75 mg into the skin once a week. Patient taking differently: Inject 0.75 mg into the skin every Monday. 04/06/22  Yes Dettinger, Fransisca Kaufmann, MD  famotidine (PEPCID) 20 MG tablet TAKE 1 TABLET BY MOUTH EVERY DAY AFTER SUPPER Patient taking differently: Take 20 mg by mouth every evening. 04/29/21  Yes Tanda Rockers, MD  Insulin Glargine [facility] KWIKPEN) 100 UNIT/ML Inject 10 Units into the skin  daily. Patient taking differently: Inject 10 Units into the skin in the morning. 04/06/22  Yes Dettinger, Fransisca Kaufmann, MD  isosorbide mononitrate (IMDUR) 30 MG 24 hr tablet TAKE 1 TABLET BY MOUTH EVERY DAY Patient taking differently: Take 30 mg by mouth every evening. 12/09/21  Yes Strader, [place] M, PA-C  linaclotide (LINZESS) 290 MCG CAPS capsule Take 1 capsule (290 mcg total) by mouth daily before breakfast. 06/02/20  Yes Dettinger, Fransisca Kaufmann, MD  montelukast (SINGULAIR) 10 MG tablet Take 1 tablet (10 mg total) by mouth daily. Patient taking differently: Take 10 mg by mouth every evening. 03/11/22  Yes Dettinger, Fransisca Kaufmann, MD  nitroGLYCERIN (NITROSTAT) 0.4 MG SL tablet Place 1 tablet (0.4 mg total) under the tongue every 5 (five) minutes as needed. 08/08/20  Yes Verta Ellen., NP  OVER THE  COUNTER MEDICATION Take 3 capsules by mouth in the morning. Balance of [employer]   Yes [provider]  OVER THE COUNTER MEDICATION Take 3 capsules by mouth in the morning. Balance of [employer]   Yes [provider]  rosuvastatin (CRESTOR) 20 MG tablet Take 1 tablet (20 mg total) by mouth daily. Patient taking differently: Take 20 mg by mouth every evening. 03/11/22  Yes Dettinger, Fransisca Kaufmann, MD  traZODone (DESYREL) 50 MG tablet TAKE 1 TABLET BY MOUTH AT BEDTIME AS NEEDED FOR SLEEP. Patient taking differently: Take 50 mg by mouth at bedtime as needed for sleep. 07/01/21  Yes Dettinger, Fransisca Kaufmann, MD  OXYGEN Inhale 3-4 L into the lungs at bedtime.    [provider]  pantoprazole (PROTONIX) 40 MG tablet TAKE 1 TABLET (40 MG TOTAL) BY MOUTH DAILY. TAKE 30-60 MIN BEFORE FIRST MEAL OF THE DAY Patient not taking: Reported on 04/11/2022 04/29/21   Tanda Rockers, MD   Allergies  Allergen Reactions   Bactrim [Sulfamethoxazole-Trimethoprim] Other (See Comments)    Renal failure   Prednisone Other (See Comments)    Irritability Insomnia    Z-Pak [Azithromycin] Other (See  Comments)    Renal failure   Norvasc [Amlodipine] Swelling    Leg swelling   Cipro [Ciprofloxacin Hcl] Itching and Rash   Tape Rash   Review of Systems  Unable to perform ROS: Patient nonverbal    Physical Exam Vitals and nursing note reviewed.  Constitutional:      General: She is not in acute distress.    Appearance: She is ill-appearing.  Pulmonary:     Effort: No respiratory distress.  Skin:    General: Skin is warm and dry.  Neurological:     Mental Status: She is lethargic.     Motor: Weakness present.  Psychiatric:        Speech: She is noncommunicative.     Vital Signs: BP (!) 150/81 (BP Location: Left Arm)   Pulse 78   Temp 97.8 F (36.6 C) (Oral)   Resp 19   Ht _0  (1.549 m)   Wt 80.6 kg   SpO2 94%   BMI 33.57 kg/m  Pain Scale: 0-10 POSS *See Group Information*: 1-Acceptable,Awake and alert Pain Score: 0-No pain   SpO2: SpO2: 94 % O2 Device:SpO2: 94 % O2 Flow Rate: .O2 Flow Rate (L/min): 3 L/min  IO: Intake/output summary:  Intake/Output Summary (Last 24 hours) at 04/15/2022 1400 Last data filed at 04/15/2022 0733 Gross per 24 hour  Intake 719.77 ml  Output 30 ml  Net 689.77 ml    LBM:   Baseline Weight: Weight: 86.2 kg Most recent weight: Weight: 80.6 kg     Palliative Assessment/Data: PPS 20%     Time In: 1345 Time Out: 1515 Time Total: 90 minutes  Greater than 50%  of this time was spent counseling and coordinating care related to the above assessment and plan.  Signed by: Lin Landsman, NP   Please contact Palliative Medicine Team phone at [phone] for questions and concerns.  For individual provider: See Amion  *Portions of this note are a verbal dictation therefore any spelling and/or grammatical errors are due to the "[employer]" system interpretation.

## 2022-04-15 NOTE — Progress Notes (Signed)
   Heart Failure Stewardship Pharmacist Progress Note   PCP: Dettinger, Fransisca Kaufmann, MD PCP-Cardiologist: Fransico Him, MD    HPI:  81 yo F with PMH of multivessel CAD, HTN, HLD, T2DM, CVA x2, COPD, GERD, fibromyalgia, and chronic pain.  She presented to the ED on 11/26 with chest pain and hypoxia. BNP and troponin elevated. CXR with mild CHF. Admitted for acute CHF and NSTEMI. ECHO on 11/26 showed LVEF 25-30%, regional wall motion abnormalities, moderate LVH, G1DD, RV normal, trivial MR. Prior ECHO in May this year with EF 50-55%. R/LHC on 11/27 showed significant multivessel disease and chronic total occlusion of RCA now extending from the ostium to mid vessel, normal filling pressures (RA 6, wedge 10, PA 17), and mildly reduced CO (4.2).  Failed swallow study on 11/27 PM, was NPO. Dysphagia diet resumed 11/28. Palliative consulted, patient having more difficulty taking meds, mentation, not eating, etc. Adult failure to thrive and worsening dysphagia.   Current HF Medications: Beta Blocker: carvedilol 6.25 mg BID MRA: spironolactone 25 mg daily Other: Imdur 30 mg daily  Prior to admission HF Medications: Beta blocker: carvedilol 12.5 mg BID Other: Imdur 30 mg daily  Pertinent Lab Values: Serum creatinine 1.05, BUN 20, Potassium 3.5, Sodium 143, BNP 1313.1, Magnesium 2.0, A1c 6.6   Vital Signs: Weight: 177 lbs (admission weight: 182 lbs) Blood pressure: 140/70s  Heart rate: 70-80s  I/O: net -1.4L  Medication Assistance / Insurance Benefits Check: Does the patient have prescription insurance?  Yes Type of insurance plan: [place] state health plan  Outpatient Pharmacy:  [facility] Is the patient willing to use [facility] at discharge? Yes Is the patient willing to transition their outpatient pharmacy to utilize a [facility] outpatient pharmacy?   Pending    Assessment: 1. Acute systolic CHF (LVEF 76-81%), due to mixed ICM and NICM. NYHA  class II symptoms. - Has not appear volume overloaded on exam, no indication for diuretics. Strict I/Os and daily weights. Keep K>4 and Mag>2. - Continue carvedilol 6.25 mg BID - Pending palliative care discussions, consider adding low dose ARB at night - Continue spironolactone 25 mg daily - Caution adding SGLT2i with frailty, frequent falls, and limited mobility   Plan: 1) Medication changes recommended at this time: - Start ARB pending palliative discussions  2) Patient assistance: - Has state health plan, can use monthly copay cards  3)  Education  - To be completed prior to discharge  Kerby Nora, PharmD, BCPS Heart Failure Stewardship Pharmacist Phone [phone]

## 2022-04-16 ENCOUNTER — Telehealth (HOSPITAL_COMMUNITY): Payer: Self-pay | Admitting: Pharmacy Technician

## 2022-04-16 ENCOUNTER — Other Ambulatory Visit (HOSPITAL_COMMUNITY): Payer: Self-pay

## 2022-04-16 ENCOUNTER — Other Ambulatory Visit: Payer: Self-pay | Admitting: Family Medicine

## 2022-04-16 DIAGNOSIS — R058 Other specified cough: Secondary | ICD-10-CM

## 2022-04-16 DIAGNOSIS — R1310 Dysphagia, unspecified: Secondary | ICD-10-CM

## 2022-04-16 LAB — GLUCOSE, CAPILLARY: Glucose-Capillary: 93 mg/dL (ref 70–99)

## 2022-04-16 MED ORDER — OXYCODONE HCL 5 MG/5ML PO SOLN
5.0000 mg | ORAL | 0 refills | Status: DC | PRN
  Filled 2022-04-16: qty 473, 8d supply, fill #0

## 2022-04-16 MED ORDER — GLYCOPYRROLATE 1 MG PO TABS
1.0000 mg | ORAL_TABLET | ORAL | 0 refills | Status: DC | PRN
  Filled 2022-04-16: qty 30, 5d supply, fill #0

## 2022-04-16 MED ORDER — MORPHINE SULFATE (CONCENTRATE) 10 MG /0.5 ML PO SOLN
5.0000 mg | ORAL | 0 refills | Status: DC | PRN
  Filled 2022-04-16: qty 20, 7d supply, fill #0

## 2022-04-16 MED ORDER — HALOPERIDOL 1 MG PO TABS
2.0000 mg | ORAL_TABLET | Freq: Four times a day (QID) | ORAL | 0 refills | Status: DC | PRN
  Filled 2022-04-16: qty 60, 8d supply, fill #0

## 2022-04-16 MED ORDER — LORAZEPAM 2 MG/ML PO CONC
1.0000 mg | ORAL | 0 refills | Status: DC | PRN
  Filled 2022-04-16: qty 30, 10d supply, fill #0

## 2022-04-16 MED ORDER — LORAZEPAM 1 MG PO TABS
1.0000 mg | ORAL_TABLET | ORAL | 0 refills | Status: AC | PRN
  Filled 2022-04-16: qty 28, 14d supply, fill #0

## 2022-04-16 MED ORDER — CEPHALEXIN 250 MG/5ML PO SUSR
500.0000 mg | Freq: Three times a day (TID) | ORAL | 0 refills | Status: AC
  Filled 2022-04-16: qty 200, 7d supply, fill #0

## 2022-04-16 NOTE — Telephone Encounter (Signed)
Patient Advocate Encounter   Received notification that prior authorization for Haloperidol '1MG'$  tablets is required.   PA submitted on 04/16/2022 [place] Status is pending       Lyndel Safe, [place] Patient Advocate Specialist [employer] Patient Advocate Team Direct Number: [phone]  Fax: [phone]

## 2022-04-16 NOTE — TOC Progression Note (Addendum)
Transition of Care [facility]) - Progression Note    Patient Details  Name: Susan Patel MRN: [id number] Date of Birth: 10-13-1940  Transition of [facility]) CM/SW Contact  Gyneth Hubka, Edson Snowball, RN Phone Number: 04/16/2022, 10:53 AM  Clinical Narrative:     Damaris Schooner to Marchia Meiers at [facility] [place]), patient has been accepted for home with hospice. Raul Del has ordered a bedside commode to be delivered to patient's home today. Raul Del has spoken to [place].   Once patient home they will send a nurse out to admit patient. [place] aware patient going home by ambulance. [place] working on medications.     NCM has secure chatted TOC pharmacy to see when medications will be ready. Once known will call [facility]. [facility] paperwork and DNR form on chart. [place] , [name]   1115 Medications will be ready in 30 minutes. Spoke to [place] confirmed address . Called [facility]   1400 [facility] here to transport patient. Nurse called [name]. NCM called Arboriculturist at [facility]).  Expected Discharge Plan: [place] Barriers to Discharge: Continued Medical Work up  Expected Discharge Plan and Services Expected Discharge Plan: [place]   Discharge Planning Services: CM Consult Post Acute Care Choice: [place] arrangements for the past 2 months: Single Family Home Expected Discharge Date: 04/16/22               DME Arranged: N/A DME Agency: NA       HH Arranged: PT, OT HH Agency: [place] Date HH Agency Contacted: 04/13/22 Time HH Agency Contacted: 1104 Representative spoke with at [place]: [place] Determinants of Health ([place]) Interventions    Readmission Risk Interventions     No data to display

## 2022-04-16 NOTE — Telephone Encounter (Signed)
Patient Advocate Encounter  Prior Authorization for Haloperidol '1MG'$  tablets has been approved.     Effective dates: 04/16/2022 through 04/17/2023      Lyndel Safe, [place] Patient Advocate Specialist [place] Patient Advocate Team Direct Number: [phone]  Fax: [phone]

## 2022-04-16 NOTE — Progress Notes (Signed)
Heart Failure Navigator Progress Note  Assessed for Heart & Vascular TOC clinic readiness.  Patient does not meet criteria due to patient full comfort care.   Navigator will sign off at this time.   Earnestine Leys, BSN, Clinical cytogeneticist Only

## 2022-04-16 NOTE — Discharge Summary (Signed)
Physician Discharge Summary  Susan Patel ZJQ:[id number] DOB: Jan 18, 1941 DOA: 04/11/2022  PCP: Dettinger, Fransisca Kaufmann, MD  Admit date: 04/11/2022 Discharge date: 04/16/2022  Admitted From: Home. Disposition: Home with home hospice  Recommendations for Outpatient Follow-up:  As per hospice provider   Discharge Condition: Fair CODE STATUS: Comfort care Diet recommendation: Regular diet, aspiration precautions, as tolerated and for comfort.  Discharge summary: 81 year old with history of coronary artery disease, type 2 diabetes, hypertension, previous stroke with residual dysarthria and aphasia, COPD on 3 to 4 L of oxygen that she uses at night at home presented to the emergency room with chest pain and shortness of breath for 1 day.  On arrival to emergency room she was found with greatly elevated troponins.  Chest x-ray showed fluid overload.  Patient was started on heparin infusion, given IV Lasix and [facility] cardiology care.  11/27, underwent left heart cath and right heart cath and found to have extensive multivessel disease.  Ejection fraction 25 to 30%.  Conservative management advised. Patient continued  to remain dysarthric with difficulty taking medications. 11/29, remained more lethargic.  Pocketing food.  Not eating adequate. MRI showed pontine stroke.  UA was abnormal. Patient continues to remain without adequate oral intake, mental status fluctuating.  Has not been eaten any meaningful meal for many weeks. Shared decision was made to convert patient to comfort care and transition to home hospice.  Patient is going home today with home hospice.  Non-STEMI New bundle branch block Acute on chronic systolic and diastolic congestive heart failure Acute on chronic hypoxemic respiratory failure Failure to thrive Significant dysphagia and dysarthria Fluctuating mental status    Unable to safely take any oral diet and medications in the  tablet form. Patient with severe and worsening cardiovascular conditions and failure to thrive. Transition home with hospice support system. She will not benefit with continuing her cardiac medications and insulin. Symptom control to be provided with oral morphine and Ativan.  Further medication management will be provided by hospice at home. Urinalysis was collected which is growing gram-negative bacteria more than 100,000 colonies, will treat with cephalexin for 7 days as this may provide her comfort. Stable to go home today to be admitted with hospice at home.    Discharge Diagnoses:  Principal Problem:   NSTEMI (non-ST elevated myocardial infarction) ([place]) Active Problems:   Essential hypertension   Gastro-esophageal reflux disease with esophagitis   Late effects of cerebral ischemic stroke   Type 2 diabetes mellitus with peripheral neuropathy (HCC)   Fibromyalgia   Chronic obstructive pulmonary disease (HCC)   Dysarthria, post-stroke   Mixed hyperlipidemia   Coronary artery disease   Acute HFrEF (heart failure with reduced ejection fraction) [facility])    Discharge Instructions  Discharge Instructions     Diet general   Complete by: As directed    Increase activity slowly   Complete by: As directed       Allergies as of 04/16/2022       Reactions   Bactrim [sulfamethoxazole-trimethoprim] Other (See Comments)   Renal failure   Prednisone Other (See Comments)   Irritability Insomnia    Z-pak [azithromycin] Other (See Comments)   Renal failure   Norvasc [amlodipine] Swelling   Leg swelling   Cipro [ciprofloxacin Hcl] Itching, Rash   Tape Rash        Medication List     STOP taking these medications    B-12 PO   Basaglar KwikPen 100  UNIT/ML   carvedilol 12.5 MG tablet Commonly known as: COREG   [employer] Aspirin Low Strength 81 MG tablet Generic drug: aspirin EC   famotidine 20 MG tablet Commonly known as: PEPCID   isosorbide mononitrate 30 MG 24 hr  tablet Commonly known as: IMDUR   linaclotide 290 MCG Caps capsule Commonly known as: Linzess   montelukast 10 MG tablet Commonly known as: SINGULAIR   nitroGLYCERIN 0.4 MG SL tablet Commonly known as: NITROSTAT   OVER THE COUNTER MEDICATION   OVER THE COUNTER MEDICATION   pantoprazole 40 MG tablet Commonly known as: PROTONIX   rosuvastatin 20 MG tablet Commonly known as: CRESTOR   traZODone 50 MG tablet Commonly known as: DESYREL   Trulicity 0.07 MA/2.6JF Sopn Generic drug: Dulaglutide       TAKE these medications    acetaminophen 325 MG tablet Commonly known as: Tylenol Take 2 tablets (650 mg total) by mouth every 6 (six) hours as needed.   cephALEXin 250 MG/5ML suspension Commonly known as: KEFLEX Take 10 mLs (500 mg total) by mouth 3 (three) times daily for 7 days.   LORazepam 1 MG tablet Commonly known as: Ativan Take 1 tablet (1 mg total) by mouth every 4 (four) hours as needed for up to 14 days for anxiety, sleep or sedation.   morphine CONCENTRATE 10 mg / 0.5 ml concentrated solution Take 0.25 mLs (5 mg total) by mouth every 2 (two) hours as needed.   OXYGEN Inhale 3-4 L into the lungs at bedtime.        [place] Follow up.   Contact information: [address] [place] [id number] [phone]                Allergies  Allergen Reactions   Bactrim [Sulfamethoxazole-Trimethoprim] Other (See Comments)    Renal failure   Prednisone Other (See Comments)    Irritability Insomnia    Z-Pak [Azithromycin] Other (See Comments)    Renal failure   Norvasc [Amlodipine] Swelling    Leg swelling   Cipro [Ciprofloxacin Hcl] Itching and Rash   Tape Rash    Consultations: Palliative care Cardiology   Procedures/Studies: MR BRAIN WO CONTRAST  Result Date: 04/14/2022 CLINICAL DATA:  Mental status change. History of stroke. Persistent aphasia and dysarthria. Rule out new stroke. EXAM: MRI  HEAD WITHOUT CONTRAST TECHNIQUE: Multiplanar, multiecho pulse sequences of the brain and surrounding structures were obtained without intravenous contrast. COMPARISON:  MRI head 08/20/2018 FINDINGS: Brain: Small area of restricted diffusion in the right pons compatible with acute infarct. Moderate to advanced generalized atrophy. Ventricular enlargement due to atrophy. Extensive white matter hyperintensity on T2 and FLAIR compatible with chronic microvascular ischemia. Chronic infarcts in the basal ganglia bilaterally. Small chronic infarct left thalamus. Chronic infarct in the anterior genu corpus callosum. Negative for hemorrhage or mass. Vascular: Normal arterial flow voids. Skull and upper cervical spine: Negative Sinuses/Orbits: Mild mucosal edema in the ethmoid sinuses. Bilateral cataract extraction. Small left mastoid effusion Other: None IMPRESSION: 1. Small area of acute infarct right pons. 2. Moderate to advanced atrophy. Extensive chronic microvascular ischemic change in the white matter and basal ganglia bilaterally. Electronically Signed   By: Franchot Gallo M.D.   On: 04/14/2022 18:56   DG Swallowing Func-Speech Pathology  Result Date: 04/13/2022 Table formatting from the original result was not included. Objective Swallowing Evaluation: Type of Study: MBS-Modified Barium Swallow Study  Patient Details Name: AZORIA ABBETT MRN: [id number] Date  of Birth: 06-21-1940 Today's Date: 04/13/2022 Time: SLP Start Time (ACUTE ONLY): 1545 -SLP Stop Time (ACUTE ONLY): 1600 SLP Time Calculation (min) (ACUTE ONLY): 15 min Past Medical History: Past Medical History: Diagnosis Date  Arthritis   'all over"  Asthma   CAD (coronary artery disease)   Multivessel CAD, not a candidate for CABG, managing medically.  Chronic pain   COPD (chronic obstructive pulmonary disease) (HCC)   O2 per [place] at nights   Depression   Diabetes mellitus without complication (HCC)   borderline- , states she was on med., but MD told her  "everything is under control so I threw the bottle away"  Fibromyalgia   GERD (gastroesophageal reflux disease)   Hypertension   Neuromuscular disorder ([place])   parkinson, neuropathy- both feet & hands.  Stroke [facility])   residual dysarthria Past Surgical History: Past Surgical History: Procedure Laterality Date  ABDOMINAL HYSTERECTOMY    ANTERIOR CERVICAL DECOMP/DISCECTOMY FUSION N/A 07/10/2015  Procedure: Cervical five-six, Cervical six-seven anterior cervical decompression with fusion plating and bonegraft;  Surgeon: Jovita Gamma, MD;  Location: [place] [facility];  Service: Neurosurgery;  Laterality: N/A;  APPENDECTOMY    BIOPSY EYE MUSCLE  03/20/2016  biopsy vessel to right eye due to swelling  EYE SURGERY Bilateral   cataracts removed, /w "cyrstal lenses"   LEFT HEART CATH AND CORONARY ANGIOGRAPHY N/A 05/23/2018  Procedure: LEFT HEART CATH AND CORONARY ANGIOGRAPHY;  Surgeon: Troy Sine, MD;  Location: [place] [facility];  Service: Cardiovascular;  Laterality: N/A;  RIGHT HEART CATH AND CORONARY ANGIOGRAPHY N/A 04/12/2022  Procedure: RIGHT HEART CATH AND CORONARY ANGIOGRAPHY;  Surgeon: Nelva Bush, MD;  Location: [place] [facility];  Service: Cardiovascular;  Laterality: N/A;  SHOULDER ARTHROSCOPY Right   x2   RCR- spurs removed  HPI: Pt is a 80 y.o. F who presents with chest pain and shortness of breath. Found with greatly elevated troponins; chest x-ray showed fluid overload. Pt started on heparin infusion and given IV Lasix. Significant PMH: coronary artery disease, type 2 diabetes, hypertension, previous stroke with residual dysarthria and aphasia, COPD on 3 to 4 L of oxygen at home. She was NPO for procedure and then when RN giving patient PO's with her dinner meal tray, she held food in mouth with little to no chewing or transit of boluses and coughing with liquids so she was kept NPO awaiting SLP evaluation.  Subjective: awake, alert and cooperative  Recommendations for follow up therapy are one  component of a multi-disciplinary discharge planning process, led by the attending physician.  Recommendations may be updated based on patient status, additional functional criteria and insurance authorization. Assessment / Plan / Recommendation   04/13/2022   4:20 PM Clinical Impressions Clinical Impression Patient exhibits a moderately impaired oral phase of swallow and a mild-moderately impaired pharyngeal phase of swallow. During oral phase, patient with decreased anterior to posterior transit of liquids and solids, prolonged mastication and holding of puree solids as well as thin and nectar thick liquids. During mastication of mechanical soft solids, patient with premature spillage of tail of bolus into vallecular sinus while patient still masticating the rest of bolus. No oral residuals observed post swallows. During pharyngeal phase of swallow, patient with swallow inititiation delays at level of pyriform sinus with thin liquids via cup sips and nectar thick liquids via straw sips and delays at level of vallecular sinus with nectar thick liquids via cup sips and thin liquids via spoon sips. Patient exhibited flash penetration with cup  and straw sips of nectar thick liquids(PAS 2) with penetration events being shallow in depth and fully exiting laryngeal vestibule. Silent aspiration occured on approximately 75% of swallows with thin liquids via cup sips (even when small, controlled sips). Aspiration events occured during the swallow and were trace in amount but patient without cough response or indication that she was sensate to aspirate. Puree solids, mechanical soft solids transited pharyngeally without difficulty and without residuals; trace vallecular and pyriform residuals observed with nectar thick liquids. SLP is recommending to initiate PO diet of Dys 2 (minced) solids and nectar thick liquids and will follow patient for toleration and ability to advance. SLP Visit Diagnosis Dysphagia, oropharyngeal  phase (R13.12) Impact on safety and function Mild aspiration risk;Moderate aspiration risk     04/13/2022   4:20 PM Treatment Recommendations Treatment Recommendations Therapy as outlined in treatment plan below     04/13/2022   4:30 PM Prognosis Prognosis for Safe Diet Advancement Fair Barriers to Reach Goals Time post onset;Severity of deficits Barriers/Prognosis Comment patient with h/o dysphagia requiring modified diet   04/13/2022   4:20 PM Diet Recommendations SLP Diet Recommendations Dysphagia 2 (Fine chop) solids;Nectar thick liquid Liquid Administration via Cup;Straw Medication Administration Whole meds with puree Compensations Slow rate;Small sips/bites Postural Changes Seated upright at 90 degrees     04/13/2022   4:20 PM Other Recommendations Oral Care Recommendations Oral care BID Follow Up Recommendations Skilled nursing-short term rehab (<3 hours/day) Functional Status Assessment Patient has had a recent decline in their functional status and demonstrates the ability to make significant improvements in function in a reasonable and predictable amount of time.   04/13/2022   4:20 PM Frequency and Duration  Speech Therapy Frequency (ACUTE ONLY) min 2x/week Treatment Duration 1 week     04/13/2022   4:09 PM Oral Phase Oral Phase Impaired Oral - Nectar Cup Weak lingual manipulation;Reduced posterior propulsion;Holding of bolus;Delayed oral transit;Piecemeal swallowing Oral - Nectar Straw Delayed oral transit;Holding of bolus;Reduced posterior propulsion;Weak lingual manipulation Oral - Thin Teaspoon Delayed oral transit;Weak lingual manipulation;Reduced posterior propulsion;Holding of bolus Oral - Thin Cup Holding of bolus;Delayed oral transit;Reduced posterior propulsion;Weak lingual manipulation Oral - Puree Delayed oral transit;Weak lingual manipulation;Reduced posterior propulsion;Piecemeal swallowing Oral - Mech Soft Delayed oral transit;Piecemeal swallowing;Impaired mastication;Reduced posterior  propulsion    04/13/2022   4:12 PM Pharyngeal Phase Pharyngeal Phase Impaired Pharyngeal- Nectar Cup Delayed swallow initiation-vallecula;Penetration/Aspiration during swallow;Pharyngeal residue - valleculae;Pharyngeal residue - pyriform Pharyngeal Material enters airway, remains ABOVE vocal cords then ejected out Pharyngeal- Nectar Straw Delayed swallow initiation-pyriform sinuses;Pharyngeal residue - valleculae;Pharyngeal residue - pyriform Pharyngeal- Thin Teaspoon Delayed swallow initiation-pyriform sinuses;Penetration/Aspiration during swallow Pharyngeal Material enters airway, remains ABOVE vocal cords then ejected out Pharyngeal- Thin Cup Trace aspiration;Penetration/Aspiration during swallow;Reduced airway/laryngeal closure;Delayed swallow initiation-pyriform sinuses Pharyngeal Material enters airway, passes BELOW cords without attempt by patient to eject out (silent aspiration)    04/13/2022   4:19 PM Cervical Esophageal Phase  Cervical Esophageal Phase [facility] Sonia Baller, MA, CCC-SLP Speech Therapy                     CARDIAC CATHETERIZATION  Result Date: 04/12/2022 Conclusions: Significant multivessel coronary artery disease, as detailed below.  Chronic total occlusion of the RCA now extends from the ostium to the mid vessel; otherwise, there has been no significant change since prior catheterization in 2020. Normal left and right heart filling pressures. Mildly reduced Fick cardiac output/index. Recommendations: Given minimal progression of coronary artery disease since 2020, recommend  continued medical therapy as the patient is a poor candidate for CABG.  PCI could be considered if she were to have refractory angina/heart failure symptoms, though diffuse nature of the LAD disease as well as LMCA bifurcation involvement would make the procedure very high risk. Aggressive secondary prevention of coronary artery disease.  Consider dual antiplatelet therapy with aspirin and clopidogrel for up to 12  months, as tolerated. Maintain net even fluid balance with escalation of goal-directed medical therapy for HFrEF, as blood pressure and renal function allow. Nelva Bush, MD [facility]  ECHOCARDIOGRAM COMPLETE  Result Date: 04/11/2022    ECHOCARDIOGRAM REPORT   Patient Name:   SHAUNIKA ITALIANO Date of Exam: 04/11/2022 Medical Rec #:  [id number]      Height:       61.0 in Accession #:    [id number]     Weight:       190.0 lb Date of Birth:  1940/08/06      BSA:          1.848 m Patient Age:    31 years       BP:           159/85 mmHg Patient Gender: F              HR:           80 bpm. Exam Location:  [facility] Procedure: 2D Echo, Cardiac Doppler and Color Doppler Indications:    Elevated Troponin  History:        Patient has prior history of Echocardiogram examinations, most                 recent 09/14/2021. CHF, Stroke and COPD; Risk                 Factors:Hypertension, Diabetes and Dyslipidemia.  Sonographer:    Alvino Chapel RCS Referring Phys: [place]  1. Left ventricular ejection fraction, by estimation, is 25 to 30%. The left ventricle has severely decreased function. The left ventricle demonstrates regional wall motion abnormalities (see scoring diagram/findings for description). The left ventricular internal cavity size was moderately dilated. There is moderate left ventricular hypertrophy. Left ventricular diastolic parameters are consistent with Grade I diastolic dysfunction (impaired relaxation). There is akinesis of the left ventricular, entire inferolateral wall. There is mild dyskinesis of the left ventricular, entire apical segment. There is severe hypokinesis of the left ventricular, inferior wall.  2. Right ventricular systolic function is normal. The right ventricular size is normal. Tricuspid regurgitation signal is inadequate for assessing PA pressure.  3. Left atrial size was mild to moderately dilated.  4. The mitral valve is normal in structure. Trivial  mitral valve regurgitation.  5. The aortic valve is tricuspid. Aortic valve regurgitation is trivial. Aortic valve sclerosis is present, with no evidence of aortic valve stenosis. Comparison(s): The left ventricular function is significantly worse. The left ventricular wall motion abnormalities are new. FINDINGS  Left Ventricle: No LV apical thrombus is seen (Definity contrast was not used). Left ventricular ejection fraction, by estimation, is 25 to 30%. The left ventricle has severely decreased function. The left ventricle demonstrates regional wall motion abnormalities. Mild dyskinesis of the left ventricular, entire apical segment. The left ventricular internal cavity size was moderately dilated. There is moderate left ventricular hypertrophy. Left ventricular diastolic parameters are consistent with Grade I diastolic dysfunction (impaired relaxation). Normal left ventricular filling pressure.  LV Wall Scoring: The entire apex is dyskinetic. The antero-lateral wall,  posterior wall, and mid inferior segment are akinetic. The basal inferior segment is hypokinetic. The anterior wall, anterior septum, mid inferoseptal segment, and basal inferoseptal segment are normal. Right Ventricle: The right ventricular size is normal. No increase in right ventricular wall thickness. Right ventricular systolic function is normal. Tricuspid regurgitation signal is inadequate for assessing PA pressure. Left Atrium: Left atrial size was mild to moderately dilated. Right Atrium: Right atrial size was normal in size. Pericardium: There is no evidence of pericardial effusion. Mitral Valve: The mitral valve is normal in structure. Trivial mitral valve regurgitation, with centrally-directed jet. Tricuspid Valve: The tricuspid valve is normal in structure. Tricuspid valve regurgitation is trivial. Aortic Valve: The aortic valve is tricuspid. Aortic valve regurgitation is trivial. Aortic valve sclerosis is present, with no evidence of  aortic valve stenosis. Pulmonic Valve: The pulmonic valve was grossly normal. Pulmonic valve regurgitation is mild. Aorta: The aortic root is normal in size and structure. IAS/Shunts: No atrial level shunt detected by color flow Doppler.  LEFT VENTRICLE PLAX 2D LVIDd:         6.10 cm      Diastology LVIDs:         4.80 cm      LV e' medial:    3.37 cm/s LV PW:         1.10 cm      LV E/e' medial:  13.4 LV IVS:        1.70 cm      LV e' lateral:   6.00 cm/s LVOT diam:     1.60 cm      LV E/e' lateral: 7.6 LV SV:         24 LV SV Index:   13 LVOT Area:     2.01 cm  LV Volumes (MOD) LV vol d, MOD A2C: 134.0 ml LV vol d, MOD A4C: 152.0 ml LV vol s, MOD A2C: 80.9 ml LV vol s, MOD A4C: 110.0 ml LV SV MOD A2C:     53.1 ml LV SV MOD A4C:     152.0 ml LV SV MOD BP:      55.5 ml RIGHT VENTRICLE RV S prime:     10.60 cm/s TAPSE (M-mode): 2.1 cm LEFT ATRIUM             Index        RIGHT ATRIUM           Index LA diam:        3.90 cm 2.11 cm/m   RA Area:     10.30 cm LA Vol (A2C):   51.8 ml 28.03 ml/m  RA Volume:   23.70 ml  12.82 ml/m LA Vol (A4C):   67.7 ml 36.63 ml/m LA Biplane Vol: 60.6 ml 32.79 ml/m  AORTIC VALVE LVOT Vmax:   61.40 cm/s LVOT Vmean:  46.400 cm/s LVOT VTI:    0.119 m  AORTA Ao Root diam: 2.80 cm MITRAL VALVE MV Area (PHT): 6.17 cm    SHUNTS MV Decel Time: 123 msec    Systemic VTI:  0.12 m MR Peak grad: 65.0 mmHg    Systemic Diam: 1.60 cm MR Mean grad: 48.0 mmHg MR Vmax:      403.00 cm/s MR Vmean:     332.0 cm/s MV E velocity: 45.30 cm/s MV A velocity: 87.10 cm/s MV E/A ratio:  0.52 Mihai Croitoru MD Electronically signed by Sanda Klein MD Signature Date/Time: 04/11/2022/12:57:21 PM    Final    DG Chest  Port 1 View  Result Date: 04/11/2022 CLINICAL DATA:  Shortness of breath and chest pain EXAM: PORTABLE CHEST 1 VIEW COMPARISON:  05/13/2020 FINDINGS: Cardiac shadow is stable. Vascular congestion is noted centrally with mild edema consistent with CHF. No focal confluent infiltrate is seen. No  effusion is noted. No acute bony abnormality is seen. IMPRESSION: Mild CHF. Electronically Signed   By: Inez Catalina M.D.   On: 04/11/2022 03:21   (Echo, Carotid, EGD, Colonoscopy, ERCP)    Subjective: Patient seen and examined.  Remains quiet and comfortable.  Does not eat or take medicine.  She loves to chew on ice cubes.  On 3 L oxygen.   Discharge Exam: Vitals:   04/15/22 1633 04/15/22 1926  BP: (!) 153/77 (!) 151/70  Pulse: 73 71  Resp: 20 14  Temp: 97.8 F (36.6 C) 98.2 F (36.8 C)  SpO2: 94% 91%   Vitals:   04/15/22 0733 04/15/22 1048 04/15/22 1633 04/15/22 1926  BP: (!) 146/75 (!) 150/81 (!) 153/77 (!) 151/70  Pulse: 79 78 73 71  Resp: (!) '22 19 20 14  '$ Temp: 97.8 F (36.6 C) 97.8 F (36.6 C) 97.8 F (36.6 C) 98.2 F (36.8 C)  TempSrc: Oral Oral Oral Oral  SpO2: 93% 94% 94% 91%  Weight:      Height:        General: Pt is alert, awake, not in acute distress Frail and debilitated.  Unable to communicate.  Follows simple commands but mostly sleepy and lethargic. Cardiovascular: RRR, S1/S2 +, no rubs, no gallops Respiratory: CTA bilaterally, no wheezing, no rhonchi, no added sounds.  Not in any distress. Abdominal: Soft, NT, ND, bowel sounds + Extremities: no edema, no cyanosis    The results of significant diagnostics from this hospitalization (including imaging, microbiology, ancillary and laboratory) are listed below for reference.     Microbiology: Recent Results (from the past 240 hour(s))  MRSA Next Gen by PCR, Nasal     Status: None   Collection Time: 04/11/22  7:34 PM   Specimen: Nasal Mucosa; Nasal Swab  Result Value Ref Range Status   MRSA by PCR Next Gen NOT DETECTED NOT DETECTED Final    Comment: (NOTE) The GeneXpert MRSA Assay (FDA approved for NASAL specimens only), is one component of a comprehensive MRSA colonization surveillance program. It is not intended to diagnose MRSA infection nor to guide or monitor treatment for MRSA  infections. Test performance is not FDA approved in patients less than 35 years old. Performed at [facility], [place] [address] [place], [place] [address]   Urine Culture     Status: Abnormal (Preliminary result)   Collection Time: 04/15/22  7:27 AM   Specimen: Urine, Clean Catch  Result Value Ref Range Status   Specimen Description URINE, CLEAN CATCH  Final   Special Requests NONE  Final   Culture (A)  Final    >=100,000 COLONIES/mL GRAM NEGATIVE RODS CULTURE REINCUBATED FOR BETTER GROWTH SUSCEPTIBILITIES TO FOLLOW Performed at [facility], [address], [place], [place] [address]    Report Status PENDING  Incomplete     Labs: BNP (last 3 results) Recent Labs    04/11/22 0245 04/12/22 0029  BNP 563.0* 7,209.4*   Basic Metabolic Panel: Recent Labs  Lab 04/11/22 0245 04/12/22 0029 04/12/22 0815 04/12/22 1815 04/12/22 1817 04/13/22 0028 04/14/22 0037 04/15/22 0052  NA 140 140  --  141 140 139 143 143  K 3.7 3.1*  --  3.4* 3.4* 3.4* 3.8 3.5  CL 108 100  --   --   --  102 106 108  CO2 24 26  --   --   --  26 25 20*  GLUCOSE 125* 142*  --   --   --  109* 125* 128*  BUN 21 14  --   --   --  '23 23 20  '$ CREATININE 0.80 1.04*  --   --   --  1.13* 1.08* 1.05*  CALCIUM 8.5* 8.5*  --   --   --  8.7* 8.8* 8.7*  MG  --   --  2.1  --   --  2.0  --   --    Liver Function Tests: No results for input(s): "AST", "ALT", "ALKPHOS", "BILITOT", "PROT", "ALBUMIN" in the last 168 hours. No results for input(s): "LIPASE", "AMYLASE" in the last 168 hours. No results for input(s): "AMMONIA" in the last 168 hours. CBC: Recent Labs  Lab 04/11/22 0245 04/12/22 0029 04/12/22 1815 04/12/22 1817 04/13/22 0028  WBC 8.8 8.9  --   --  7.9  NEUTROABS 6.0  --   --   --   --   HGB 13.7 14.3 13.9 13.9 13.3  HCT 41.4 43.3 41.0 41.0 40.6  MCV 88.3 87.7  --   --  88.3  PLT 222 219  --   --  198   Cardiac Enzymes: No results for input(s): "CKTOTAL", "CKMB", "CKMBINDEX",  "TROPONINI" in the last 168 hours. BNP: Invalid input(s): "POCBNP" CBG: Recent Labs  Lab 04/15/22 0607 04/15/22 1136 04/15/22 1630 04/15/22 2102 04/16/22 0554  GLUCAP 126* 183* 93 118* 93   D-Dimer No results for input(s): "DDIMER" in the last 72 hours. Hgb A1c No results for input(s): "HGBA1C" in the last 72 hours. Lipid Profile No results for input(s): "CHOL", "HDL", "LDLCALC", "TRIG", "CHOLHDL", "LDLDIRECT" in the last 72 hours. Thyroid function studies No results for input(s): "TSH", "T4TOTAL", "T3FREE", "THYROIDAB" in the last 72 hours.  Invalid input(s): "FREET3" Anemia work up No results for input(s): "VITAMINB12", "FOLATE", "FERRITIN", "TIBC", "IRON", "RETICCTPCT" in the last 72 hours. Urinalysis    Component Value Date/Time   COLORURINE AMBER (A) 04/14/2022 2224   APPEARANCEUR CLOUDY (A) 04/14/2022 2224   LABSPEC 1.025 04/14/2022 2224   PHURINE 6.0 04/14/2022 2224   GLUCOSEU NEGATIVE 04/14/2022 2224   HGBUR LARGE (A) 04/14/2022 2224   BILIRUBINUR NEGATIVE 04/14/2022 2224   KETONESUR 20 (A) 04/14/2022 2224   PROTEINUR 100 (A) 04/14/2022 2224   NITRITE POSITIVE (A) 04/14/2022 2224   LEUKOCYTESUR TRACE (A) 04/14/2022 2224   Sepsis Labs Recent Labs  Lab 04/11/22 0245 04/12/22 0029 04/13/22 0028  WBC 8.8 8.9 7.9   Microbiology Recent Results (from the past 240 hour(s))  MRSA Next Gen by PCR, Nasal     Status: None   Collection Time: 04/11/22  7:34 PM   Specimen: Nasal Mucosa; Nasal Swab  Result Value Ref Range Status   MRSA by PCR Next Gen NOT DETECTED NOT DETECTED Final    Comment: (NOTE) The GeneXpert MRSA Assay (FDA approved for NASAL specimens only), is one component of a comprehensive MRSA colonization surveillance program. It is not intended to diagnose MRSA infection nor to guide or monitor treatment for MRSA infections. Test performance is not FDA approved in patients less than 39 years old. Performed at [facility], [place] [address] [place], [place] [address]   Urine Culture     Status: Abnormal (Preliminary  result)   Collection Time: 04/15/22  7:27 AM   Specimen: Urine, Clean Catch  Result Value Ref Range Status   Specimen Description URINE, CLEAN CATCH  Final   Special Requests NONE  Final   Culture (A)  Final    >=100,000 COLONIES/mL GRAM NEGATIVE RODS CULTURE REINCUBATED FOR BETTER GROWTH SUSCEPTIBILITIES TO FOLLOW Performed at [facility], [address] [place], [place] [address]    Report Status PENDING  Incomplete     Time coordinating discharge: 35 minutes  SIGNED:   Barb Merino, MD  [facility] 04/16/2022, 11:45 AM

## 2022-04-16 NOTE — Consult Note (Signed)
   [facility] CM Inpatient Consult   04/16/2022  Susan Patel Mar 03, 1941 [id number]  Follow up:  Disposition noted  Chart review reveals patient for home with hospice care with [facility]  Plan:  Hospice to manage patient's post hospital transitional care needs and no [facility] Care Coordination will be needed. Will alert team of disposition.  For questions,  Natividad Brood, RN BSN [place]  [phone] business mobile phone Toll free office [phone]  *[place]  [phone] Fax number: [phone] [email or web address]

## 2022-04-16 NOTE — Progress Notes (Signed)
Daily Progress Note   Patient Name: Susan Patel       Date: 04/16/2022 DOB: 1940/07/18  Age: 81 y.o. MRN#: [id number] Attending Physician: Barb Merino, MD Primary Care Physician: Dettinger, Fransisca Kaufmann, MD Admit Date: 04/11/2022  Reason for Consultation/Follow-up: Non pain symptom management, Pain control, Psychosocial/spiritual support, and Terminal Care  Subjective: Chart review performed. Unable to reach primary RN for report.   Went to visit patient at bedside - no family/visitors present. Patient was lying in bed awake, closes eyes during conversation - she is nonverbal but able to answer simple yes/no questions with nodding/shaking her head appropriately. She indicates she wants ice chips by holding up her cup. She denies pain. No signs or non-verbal gestures of pain or discomfort noted. No respiratory distress, increased work of breathing, or secretions noted. She is very tired and weak appearing.   Ice chips provided.  Length of Stay: 5  Current Medications: Scheduled Meds:   antiseptic oral rinse  15 mL Topical BID   insulin aspart  0-5 Units Subcutaneous QHS   insulin aspart  0-9 Units Subcutaneous TID WC    Continuous Infusions:   PRN Meds: acetaminophen **OR** acetaminophen, albuterol, diphenhydrAMINE, glycopyrrolate **OR** glycopyrrolate **OR** glycopyrrolate, haloperidol **OR** haloperidol **OR** haloperidol lactate, LORazepam **OR** LORazepam **OR** LORazepam, nitroGLYCERIN, ondansetron **OR** ondansetron (ZOFRAN) IV, mouth rinse, oxyCODONE, polyvinyl alcohol  Physical Exam Vitals and nursing note reviewed.  Constitutional:      General: She is not in acute distress.    Appearance: She is ill-appearing.  Pulmonary:     Effort: No respiratory distress.  Skin:     General: Skin is warm and dry.  Neurological:     Motor: Weakness present.     Comments: sleepy  Psychiatric:        Speech: She is noncommunicative.        Behavior: Behavior is slowed. Behavior is cooperative.             Vital Signs: BP (!) 151/70 (BP Location: Left Arm)   Pulse 71   Temp 98.2 F (36.8 C) (Oral)   Resp 14   Ht '5\' 1"'$  (1.549 m)   Wt 80.6 kg   SpO2 91%   BMI 33.57 kg/m  SpO2: SpO2: 91 % O2 Device: O2 Device: Room Air O2 Flow Rate: O2 Flow Rate (  L/min): 3 L/min  Intake/output summary:  Intake/Output Summary (Last 24 hours) at 04/16/2022 1016 Last data filed at 04/16/2022 0348 Gross per 24 hour  Intake 521.67 ml  Output 200 ml  Net 321.67 ml   LBM:   Baseline Weight: Weight: 86.2 kg Most recent weight: Weight: 80.6 kg       Palliative Assessment/Data: PPS 20%      Patient Active Problem List   Diagnosis Date Noted   Acute HFrEF (heart failure with reduced ejection fraction) ([place]) 04/12/2022   NSTEMI (non-ST elevated myocardial infarction) ([place]) 04/11/2022   Upper airway cough syndrome 05/13/2020   Nocturnal hypoxemia 21/19/4174   Chronic systolic CHF (congestive heart failure) ([place]) 08/21/2018   Cerebrovascular disease 08/21/2018   Pulmonary nodules 06/07/2018   Coronary artery disease    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   History of stroke 04/04/2017   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   Contusion of left elbow 03/15/2017   Dysmetria    Supplemental oxygen dependent    Hypoalbuminemia due to protein-calorie malnutrition (HCC)    Oropharyngeal dysphagia    Cognitive deficit due to recent stroke 04/23/2016   Frequent falls 04/21/2016   Type 2 diabetes mellitus with peripheral neuropathy (HCC)    Fibromyalgia    Chronic pain syndrome    Chronic obstructive pulmonary disease (HCC)    Gait disturbance, post-stroke    Dysarthria, post-stroke    Dysphagia, post-stroke    Mixed hyperlipidemia    Late effects of cerebral ischemic  stroke 03/20/2016   Allergic rhinitis 12/17/2015   Primary osteoarthritis of both knees 11/19/2015   Obesity (BMI 30-39.9) 09/04/2015   Essential hypertension 09/04/2015   Constipation 09/04/2015   Raynaud's phenomenon 08/26/2015   Gastro-esophageal reflux disease with esophagitis 08/26/2015   Spinal stenosis 08/26/2015   Vitamin D deficiency 08/26/2015   DDD (degenerative disc disease), cervical 07/10/2015   Cervical radicular pain 04/30/2013    Palliative Care Assessment & Plan   Patient Profile: 81 y.o. female  with past medical history of  coronary artery disease diabetes, hypertension, previous stroke with residual dysarthria, and COPD on oxygen at night presented to hospital with complaints of chest pain and shortness of breath. Patient was admitted on 04/11/2022 with NSTEMI, new onset acute CHF, acute respiratory failure with hypoxia.    11/27: underwent left heart cath and right heart cath and found to have extensive multivessel disease.  Ejection fraction 25 to 30%.  Conservative management advised - not a candidate for advanced therapies. Patient continues to remain dysarthric with difficulty taking medications. 11/29: remained more lethargic.  Pocketing food.  Not eating adequately. MRI showed very tiny pontine stroke.  UA was abnormal.  Assessment: Principal Problem:   NSTEMI (non-ST elevated myocardial infarction) ([place]) Active Problems:   Essential hypertension   Gastro-esophageal reflux disease with esophagitis   Late effects of cerebral ischemic stroke   Type 2 diabetes mellitus with peripheral neuropathy (HCC)   Fibromyalgia   Chronic obstructive pulmonary disease (HCC)   Dysarthria, post-stroke   Mixed hyperlipidemia   Coronary artery disease   Acute HFrEF (heart failure with reduced ejection fraction) ([place])   Terminal care  Recommendations/Plan: Continue full comfort measures Continue DNR/DNI as previously documented Home with hospice today Continue current  comfort focused medication regimen - no changes today PMT will continue to follow and support holistically  Symptom Management Roxicodone liquid PRN pain/dyspnea/increased work of breathing/RR>25 Tylenol PRN pain/fever Biotin twice daily Benadryl PRN itching Robinul PRN secretions Haldol PRN agitation/delirium Ativan PRN  anxiety/seizure/sleep/distress Zofran PRN nausea/vomiting Liquifilm Tears PRN dry eye  Goals of Care and Additional Recommendations: Limitations on Scope of Treatment: Full Comfort Care  Code Status:    Code Status Orders  (From admission, onward)           Start     Ordered   04/15/22 1505  Do not attempt resuscitation (DNR)  Continuous       Question Answer Comment  In the event of cardiac or respiratory ARREST Do not call a "code blue"   In the event of cardiac or respiratory ARREST Do not perform Intubation, CPR, defibrillation or ACLS   In the event of cardiac or respiratory ARREST Use medication by any route, position, wound care, and other measures to relive pain and suffering. May use oxygen, suction and manual treatment of airway obstruction as needed for comfort.      04/15/22 1509           Code Status History     Date Active Date Inactive Code Status Order ID Comments User Context   04/12/2022 1852 04/15/2022 1509 DNR [id number]  Nelva Bush, MD Inpatient   04/12/2022 1803 04/12/2022 1852 Full Code [id number]  Nelva Bush, MD Inpatient   04/11/2022 0941 04/12/2022 1803 DNR [id number]  Kathie Dike, MD ED   08/20/2018 0630 08/26/2018 1530 DNR [id number]  Oswald Hillock, MD Inpatient   05/23/2018 1024 05/23/2018 1734 Full Code [id number]  Troy Sine, MD Inpatient   10/12/2016 1820 10/20/2016 1353 Full Code [id number]  Bary Leriche, PA-C Inpatient   10/12/2016 1820 10/12/2016 1820 Full Code [id number]  Bary Leriche, PA-C Inpatient   10/09/2016 1609 10/12/2016 1756 Full Code [id number]  [name], MD Inpatient   03/23/2016 1632  03/31/2016 1404 Full Code [id number]  Bary Leriche, PA-C Inpatient   03/23/2016 1632 03/23/2016 1632 Full Code [id number]  Bary Leriche, PA-C Inpatient   03/20/2016 1959 03/23/2016 1555 Full Code [id number]  Erline Hau, MD Inpatient   07/10/2015 1731 07/11/2015 2259 Full Code [id number]  Jovita Gamma, MD Inpatient       Prognosis:  < 2 weeks  Discharge Planning: Home with Hospice  Care plan was discussed with patient  Thank you for allowing the Palliative Medicine Team to assist in the care of this patient.  Lin Landsman, NP  Please contact Palliative Medicine Team phone at [phone] for questions and concerns.   *Portions of this note are a verbal dictation therefore any spelling and/or grammatical errors are due to the "[place] [employer]" system interpretation.

## 2022-04-18 LAB — URINE CULTURE: Culture: >=100000 — AB

## 2022-04-28 ENCOUNTER — Ambulatory Visit (INDEPENDENT_AMBULATORY_CARE_PROVIDER_SITE_OTHER): Payer: Medicare Other

## 2022-04-28 VITALS — Ht 61.0 in | Wt 177.0 lb

## 2022-04-28 DIAGNOSIS — Z0000 Encounter for general adult medical examination without abnormal findings: Secondary | ICD-10-CM

## 2022-04-28 NOTE — Progress Notes (Signed)
Subjective:   Susan Patel is a 81 y.o. female who presents for Medicare Annual (Subsequent) preventive examination. I connected with  Susan Patel on 04/28/22 by a audio enabled telemedicine application and verified that I am speaking with the correct person using two identifiers.  Patient Location: Home  Provider Location: Home Office  I discussed the limitations of evaluation and management by telemedicine. The patient expressed understanding and agreed to proceed.  Review of Systems     Cardiac Risk Factors include: advanced age (>56mn, >>83women);diabetes mellitus;hypertension;dyslipidemia     Objective:    Today's Vitals   04/28/22 1328  Weight: 177 lb (80.3 kg)  Height: _0  (1.549 m)   Body mass index is 33.44 kg/m.     04/28/2022    1:31 PM 04/11/2022    2:53 AM 04/27/2021    1:41 PM 04/24/2020    2:12 PM 11/22/2019    4:32 PM 01/25/2019    1:35 PM 08/20/2018    9:18 AM  Advanced Directives  Does Patient Have a Medical Advance Directive? Yes No Yes No No No No  Type of AParamedicof [place]ennetteLiving will  [place] will      Copy of [place] Chart? No - copy requested  No - copy requested      Would patient like information on creating a medical advance directive?  No - Patient declined  No - Patient declined No - Patient declined No - Patient declined No - Patient declined    Current Medications (verified) Outpatient Encounter Medications as of 04/28/2022  Medication Sig   acetaminophen (TYLENOL) 325 MG tablet Take 2 tablets (650 mg total) by mouth every 6 (six) hours as needed.   LORazepam (ATIVAN) 1 MG tablet Take 1 tablet (1 mg total) by mouth every 4 (four) hours as needed for up to 14 days for anxiety, sleep or sedation.   Morphine Sulfate (MORPHINE CONCENTRATE) 10 mg / 0.5 ml concentrated solution Take 0.25 mLs (5 mg total) by mouth every 2 (two) hours as needed.   OXYGEN Inhale  3-4 L into the lungs at bedtime.   No facility-administered encounter medications on file as of 04/28/2022.    Allergies (verified) Bactrim [sulfamethoxazole-trimethoprim], Prednisone, Z-pak [azithromycin], Norvasc [amlodipine], Cipro [ciprofloxacin hcl], and Tape   History: Past Medical History:  Diagnosis Date   Arthritis    'all over"   Asthma    CAD (coronary artery disease)    Multivessel CAD, not a candidate for CABG, managing medically.   Chronic pain    COPD (chronic obstructive pulmonary disease) (HCC)    O2 per [place] at nights    Depression    Diabetes mellitus without complication (HCC)    borderline- , states she was on med., but MD told her "everything is under control so I threw the bottle away"   Fibromyalgia    GERD (gastroesophageal reflux disease)    Hypertension    Ne[place]romuscular disorder ([place]    parkinson, neuropathy- both feet & hands.   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   Past Surgical History:  Procedure Laterality Date   ABDOMINAL HYSTERECTOMY     ANTERIOR CERVICAL DECOMP/DISCECTOMY FUSION N/A 07/10/2015   Procedure: Cervical five-six, Cervical six-seven anterior cervical decompression with fusion plating and bonegraft;  Surgeon: RJovita Gamma MD;  [facility];  Service: Neurosurgery;  Laterality: N/A;   APPENDECTOMY     BIOPSY EYE MUSCLE  03/20/2016   biopsy vessel to right eye due to swelling   EYE SURGERY Bilateral    cataracts removed, /w "cyrstal lenses"    LEFT HEART CATH AND CORONARY ANGIOGRAPHY N/A 05/23/2018   Procedure: LEFT HEART CATH AND CORONARY ANGIOGRAPHY;  Surgeon: Troy Sine, MD;  Location: [place] [facility];  Service: Cardiovascular;  Laterality: N/A;   RIGHT HEART CATH AND CORONARY ANGIOGRAPHY N/A 04/12/2022   Procedure: RIGHT HEART CATH AND CORONARY ANGIOGRAPHY;  Surgeon: Nelva Bush, MD;  Location: [place] [facility];  Service: Cardiovascular;  Laterality: N/A;   SHOULDER ARTHROSCOPY Right    x2   RCR-  spurs removed    Family History  Problem Relation Age of Onset   Heart disease Mother    Cancer Father    Heart disease Father    Arthritis Sister    Heart failure Sister    Cancer Brother    Thyroid cancer Son    Atrial fibrillation Son    Arthritis Sister    Social History   Socioeconomic History   Marital status: Married    Spouse name: wayne   Number of children: 2   Years of education: 16   Highest education level: Bachelor's degree (e.g., BA, AB, BS)  Occupational History   Occupation: retired    Comment: Automotive engineer  Tobacco Use   Smoking status: Never   Smokeless tobacco: Never  Vaping Use   Vaping Use: Never used  Substance and Sexual Activity   Alcohol use: No   Drug use: No   Sexual activity: Yes  Other Topics Concern   Not on file  Social History Narrative   Lives home with her husband   Handicap accessible bathroom   She has had several strokes - left her with impaired balance, memory, speech   Her husband takes care of cooking, cleaning, driving, etc   Social Determinants of Health   Financial Resource Strain: Low Risk  (04/28/2022)   Overall Financial Resource Strain (CARDIA)    Difficulty of Paying Living Expenses: Not hard at all  Food Insecurity: No Food Insecurity (04/28/2022)   Hunger Vital Sign    Worried About Running Out of Food in the Last Year: Never true    Ran Out of Food in the Last Year: Never true  Transportation Needs: No Transportation Needs (04/28/2022)   PRAPARE - Hydrologist (Medical): No    Lack of Transportation (Non-Medical): No  Physical Activity: Inactive (04/28/2022)   Exercise Vital Sign    Days of Exercise per Week: 0 days    Minutes of Exercise per Session: 0 min  Stress: No Stress Concern Present (04/28/2022)   [place]    Feeling of Stress : Not at all  Social Connections: Moderately Integrated  (04/28/2022)   Social Connection and Isolation Panel [NHANES]    Frequency of Communication with Friends and Family: More than three times a week    Frequency of Social Gatherings with Friends and Family: More than three times a week    Attends Religious Services: More than 4 times per year    Active Member of [employer] or Organizations: No    Attends Archivist Meetings: Never    Marital Status: Married    Tobacco Counseling Counseling given: Not Answered   Clinical Intake:  Pre-visit preparation completed: Yes  Pain : No/denies pain     Nutritional Risks: None Diabetes: Yes  CBG done?: No Did pt. bring in CBG monitor from home?: No  How often do you need to have someone help you when you read instructions, pamphlets, or other written materials from your doctor or pharmacy?: 1 - Never  Diabetic?yes Nutrition Risk Assessment:  Has the patient had any N/V/D within the last 2 months?  No  Does the patient have any non-healing wounds?  No  Has the patient had any unintentional weight loss or weight gain?  No   Diabetes:  Is the patient diabetic?  Yes  If diabetic, was a CBG obtained today?  No  Did the patient bring in their glucometer from home?  No  How often do you monitor your CBG's? Daily .   Financial Strains and Diabetes Management:  Are you having any financial strains with the device, your supplies or your medication? No .  Does the patient want to be seen by Chronic Care Management for management of their diabetes?  No  Would the patient like to be referred to a Nutritionist or for Diabetic Management?  No   Diabetic Exams:  Diabetic Eye Exam: Completed 05/2021 Diabetic Foot Exam: Overdue, Pt has been advised about the importance in completing this exam. Pt is scheduled for diabetic foot exam on next office visit .   Interpreter Needed?: No  Information entered by :: Jadene Pierini, LPN   Activities of Daily Living    04/28/2022    1:31 PM  In  your present state of health, do you have any difficulty performing the following activities:  Hearing? 0  Vision? 0  Difficulty concentrating or making decisions? 0  Walking or climbing stairs? 0  Dressing or bathing? 0  Doing errands, shopping? 0  Preparing Food and eating ? N  Using the Toilet? N  In the past six months, have you accidently leaked urine? N  Do you have problems with loss of bowel control? N  Managing your Medications? N  Managing your Finances? N  Housekeeping or managing your Housekeeping? N    Patient Care Team: Dettinger, Fransisca Kaufmann, MD as PCP - General (Family Medicine) Sueanne Margarita, MD as PCP - Cardiology (Cardiology) Gala Romney Cristopher Estimable, MD as Consulting Physician (Gastroenterology) Lavera Guise, [facility] as Pharmacist (Family Medicine)  Indicate any recent Medical Services you may have received from other than [facility] providers in the past year (date may be approximate).     Assessment:   This is a routine wellness examination for Rosealee.  Hearing/Vision screen Vision Screening - Comments:: Wears rx glasses - up to date with routine eye exams with  Dr.johnson   Dietary issues and exercise activities discussed: Current Exercise Habits: The patient does not participate in regular exercise at present   Goals Addressed             This Visit's Progress    DIET - EAT MORE FRUITS AND VEGETABLES   On track      Depression Screen    04/28/2022    1:30 PM 03/11/2022    4:26 PM 07/15/2021    1:40 PM 04/27/2021    1:41 PM 01/15/2021   10:59 AM 09/18/2020    1:03 PM 06/25/2020    1:40 PM  PHQ 2/9 Scores  PHQ - 2 Score 0 0 0 0 0 0 0  PHQ- 9 Score 0  4        Fall Risk    04/28/2022    1:29 PM 03/11/2022    4:26 PM  07/15/2021    1:40 PM 04/27/2021    1:39 PM 01/15/2021   10:59 AM  Fall Risk   Falls in the past year? 1 0 0 1 0  Number falls in past yr: 1   1   Injury with Fall? 1   0   Risk for fall due to : History of fall(s);Impaired  balance/gait;Orthopedic patient   History of fall(s);Impaired balance/gait;Impaired vision;Mental status change;Orthopedic patient;Medication side effect   Follow up Education provided;Falls prevention discussed   Education provided;Falls prevention discussed     FALL RISK PREVENTION PERTAINING TO THE HOME:  Any stairs in or around the home? Yes  If so, are there any without handrails? No  Home free of loose throw rugs in walkways, pet beds, electrical cords, etc? Yes  Adequate lighting in your home to reduce risk of falls? Yes   ASSISTIVE DEVICES UTILIZED TO PREVENT FALLS:  Life alert? Yes  Use of a cane, walker or w/c? No  Grab bars in the bathroom? Yes  Shower chair or bench in shower? Yes  Elevated toilet seat or a handicapped toilet? Yes       08/17/2017    2:41 PM  MMSE - Mini Mental State Exam  Orientation to time 4  Orientation to Place 5  Registration 3  Attention/ Calculation 5  Recall 3  Language- name 2 objects 2  Language- repeat 1  Language- follow 3 step command 3  Language- read & follow direction 1  Write a sentence 1  Copy design 0  Total score 28        04/28/2022    1:31 PM 04/24/2020    1:57 PM 01/25/2019    1:40 PM  6CIT Screen  What Year? 0 points 0 points 0 points  What month? 0 points 0 points 0 points  What time? 0 points 0 points 0 points  Count back from 20 0 points 0 points 0 points  Months in reverse 0 points 4 points 0 points  Repeat phrase 4 points 0 points 2 points  Total Score 4 points 4 points 2 points    Immunizations Immunization History  Administered Date(s) Administered   Fluad Quad(high Dose 65+) 07/15/2021   Influenza Split 02/14/2018   Influenza, High Dose Seasonal PF 03/23/2017   Influenza,inj,Quad PF,6+ Mos 03/21/2016   Influenza-Unspecified 05/01/2012, 06/12/2013, 04/04/2014, 03/17/2018   Moderna Sars-Covid-2 Vaccination 07/19/2019, 08/24/2019, 06/12/2020   Pneumococcal Conjugate-13 04/04/2014   Pneumococcal  Polysaccharide-23 04/25/2006, 03/27/2009   Tdap 05/31/2011, 07/15/2021    TDAP status: Up to date  Flu Vaccine status: Due, Education has been provided regarding the importance of this vaccine. Advised may receive this vaccine at local pharmacy or Health Dept. Aware to provide a copy of the vaccination record if obtained from local pharmacy or Health Dept. Verbalized acceptance and understanding.  Pneumococcal vaccine status: Up to date  Covid-19 vaccine status: Completed vaccines  Qualifies for Shingles Vaccine? Yes   Zostavax completed No   Shingrix Completed?: No.    Education has been provided regarding the importance of this vaccine. Patient has been advised to call insurance company to determine out of pocket expense if they have not yet received this vaccine. Advised may also receive vaccine at local pharmacy or Health Dept. Verbalized acceptance and understanding.  Screening Tests Health Maintenance  Topic Date Due   Zoster Vaccines- Shingrix (1 of 2) Never done   OPHTHALMOLOGY EXAM  06/26/2020   Diabetic kidney evaluation - Urine ACR  06/25/2021  COVID-19 Vaccine (4 - 2023-24 season) 01/15/2022   INFLUENZA VACCINE  08/15/2022 (Originally 12/15/2021)   HEMOGLOBIN A1C  10/10/2022   FOOT EXAM  03/12/2023   Diabetic kidney evaluation - eGFR measurement  04/16/2023   Medicare Annual Wellness (AWV)  04/29/2023   DTaP/Tdap/Td (3 - Td or Tdap) 07/16/2031   Pneumonia Vaccine 51+ Years old  Completed   DEXA SCAN  Completed   HPV VACCINES  Aged Out   Hepatitis C Screening  Discontinued    Health Maintenance  Health Maintenance Due  Topic Date Due   Zoster Vaccines- Shingrix (1 of 2) Never done   OPHTHALMOLOGY EXAM  06/26/2020   Diabetic kidney evaluation - Urine ACR  06/25/2021   COVID-19 Vaccine (4 - 2023-24 season) 01/15/2022    Colorectal cancer screening: No longer required.   Mammogram status: No longer required due to age.  Bone Density status: Completed  09/22/2020. Results reflect: Bone density results: OSTEOPENIA. Repeat every 5 years.  Lung Cancer Screening: (Low Dose CT Chest recommended if Age 57-80 years, 30 pack-year currently smoking OR have quit w/in 15years.) does not qualify.   Lung Cancer Screening Referral: n/a  Additional Screening:  Hepatitis C Screening: does not qualify;   Vision Screening: Recommended annual ophthalmology exams for early detection of glaucoma and other disorders of the eye. Is the patient up to date with their annual eye exam?  Yes  Who is the provider or what is the name of the office in which the patient attends annual eye exams? Dr.johnson  If pt is not established with a provider, would they like to be referred to a provider to establish care? No .   Dental Screening: Recommended annual dental exams for proper oral hygiene  Community Resource Referral / Chronic Care Management: CRR required this visit?  No   CCM required this visit?  No      Plan:     I have personally reviewed and noted the following in the patient's chart:   Medical and social history Use of alcohol, tobacco or illicit drugs  Current medications and supplements including opioid prescriptions. Patient is currently taking opioid prescriptions. Information provided to patient regarding non-opioid alternatives. Patient advised to discuss non-opioid treatment plan with their provider. Functional ability and status Nutritional status Physical activity Advanced directives List of other physicians Hospitalizations, surgeries, and ER visits in previous 12 months Vitals Screenings to include cognitive, depression, and falls Referrals and appointments  In addition, I have reviewed and discussed with patient certain preventive protocols, quality metrics, and best practice recommendations. A written personalized care plan for preventive services as well as general preventive health recommendations were provided to patient.      Daphane Shepherd, LPN   [id number]   Nurse Notes: Due Flu vaccine

## 2022-04-28 NOTE — Patient Instructions (Signed)
Susan Patel , Thank you for taking time to come for your Medicare Wellness Visit. I appreciate your ongoing commitment to your health goals. Please review the following plan we discussed and let me know if I can assist you in the future.   These are the goals we discussed:  Goals       DIET - EAT MORE FRUITS AND VEGETABLES      DIET - INCREASE WATER INTAKE      Exercise 150 min/wk Moderate Activity      Prevent falls      T2DM, HTN PHARMD GOAL (pt-stated)      Current Barriers:  Unable to independently afford treatment regimen Unable to achieve control of T2DM   Pharmacist Clinical Goal(s):  patient will verbalize ability to afford treatment regimen achieve control of T2DM as evidenced by GOAL A1C, IMPROVED GLYCEMIC CONTROL maintain control of T2DM as evidenced by GOAL A1C, IMPROVED GLYCEMIC CONTROL  through collaboration with PharmD and provider.   Interventions: 1:1 collaboration with Dettinger, Fransisca Kaufmann, MD regarding development and update of comprehensive plan of care as evidenced by provider attestation and co-signature Inter-disciplinary care team collaboration (see longitudinal plan of care) Comprehensive medication review performed; medication list updated in electronic medical record  Diabetes: Goal on Track (progressing): YES. Controlled-A1C 6.3%, GFR 53;   Current treatment: BASAGLAR 10 UNITS, TRULICITY 0.'75MG'$  SQ WEEKLY;  Denies personal and family history of Medullary thyroid cancer (MTC) CONTINUE CURRENT THERAPY Transitioned from [place] to [place] 10 units since able to get free from [employer] patient assistance  Current glucose readings: fasting glucose: <130, post prandial glucose: n/a Denies hypoglycemic/hyperglycemic symptoms Discussed meal planning options and Plate method for healthy eating Avoid sugary drinks and desserts Incorporate balanced protein, non starchy veggies, 1 serving of carbohydrate with each meal Increase water intake Increase physical  activity as able Current exercise: n/a Educated on glp1, patient assistance, medications Collaborated with PCP on application Assessed patient finances. Patient approved for [employer] patient assistance until 04/2022--added basaglar    Hypertension:  Goal on Track (progressing): YES. uncontrolled; current treatment:carvedilol, isosorbide Current home readings: [id number] systolics Denies hypotensive/hypertensive symptoms Educated on BP goal <130/80--taking medications as prescribed Recommended continue keeping BP log and will address at follow up  *Application for [employer] via [place] patient assistance completed for patient and placed up front*  Patient Goals/Self-Care Activities patient will:  - take medications as prescribed as evidenced by patient report and record review check blood pressure daily, document, and provide at future appointments collaborate with provider on medication access solutions         This is a list of the screening recommended for you and due dates:  Health Maintenance  Topic Date Due   Zoster (Shingles) Vaccine (1 of 2) Never done   Eye exam for diabetics  06/26/2020   Yearly kidney health urinalysis for diabetes  06/25/2021   COVID-19 Vaccine (4 - 2023-24 season) 01/15/2022   Flu Shot  08/15/2022*   Hemoglobin A1C  10/10/2022   Complete foot exam   03/12/2023   Yearly kidney function blood test for diabetes  04/16/2023   Medicare Annual Wellness Visit  04/29/2023   DTaP/Tdap/Td vaccine (3 - Td or Tdap) 07/16/2031   Pneumonia Vaccine  Completed   DEXA scan (bone density measurement)  Completed   HPV Vaccine  Aged Out   Hepatitis C Screening: USPSTF Recommendation to screen - Ages 75-79 yo.  Discontinued  *Topic was postponed. The date shown is not the original  due date.    Advanced directives: Advance directive discussed with you today. I have provided a copy for you to complete at home and have notarized. Once this is complete please bring a  copy in to our office so we can scan it into your chart.   Conditions/risks identified: Aim for 30 minutes of exercise or brisk walking, 6-8 glasses of water, and 5 servings of fruits and vegetables each day.   Next appointment: Follow up in one year for your annual wellness visit    Preventive Care 65 Years and Older, Female Preventive care refers to lifestyle choices and visits with your health care provider that can promote health and wellness. What does preventive care include? A yearly physical exam. This is also called an annual well check. Dental exams once or twice a year. Routine eye exams. Ask your health care provider how often you should have your eyes checked. Personal lifestyle choices, including: Daily care of your teeth and gums. Regular physical activity. Eating a healthy diet. Avoiding tobacco and drug use. Limiting alcohol use. Practicing safe sex. Taking low-dose aspirin every day. Taking vitamin and mineral supplements as recommended by your health care provider. What happens during an annual well check? The services and screenings done by your health care provider during your annual well check will depend on your age, overall health, lifestyle risk factors, and family history of disease. Counseling  Your health care provider may ask you questions about your: Alcohol use. Tobacco use. Drug use. Emotional well-being. Home and relationship well-being. Sexual activity. Eating habits. History of falls. Memory and ability to understand (cognition). Work and work Statistician. Reproductive health. Screening  You may have the following tests or measurements: Height, weight, and BMI. Blood pressure. Lipid and cholesterol levels. These may be checked every 5 years, or more frequently if you are over 64 years old. Skin check. Lung cancer screening. You may have this screening every year starting at age 70 if you have a 30-pack-year history of smoking and currently  smoke or have quit within the past 15 years. Fecal occult blood test (FOBT) of the stool. You may have this test every year starting at age 57. Flexible sigmoidoscopy or colonoscopy. You may have a sigmoidoscopy every 5 years or a colonoscopy every 10 years starting at age 15. Hepatitis C blood test. Hepatitis B blood test. Sexually transmitted disease (STD) testing. Diabetes screening. This is done by checking your blood sugar (glucose) after you have not eaten for a while (fasting). You may have this done every 1-3 years. Bone density scan. This is done to screen for osteoporosis. You may have this done starting at age 39. Mammogram. This may be done every 1-2 years. Talk to your health care provider about how often you should have regular mammograms. Talk with your health care provider about your test results, treatment options, and if necessary, the need for more tests. Vaccines  Your health care provider may recommend certain vaccines, such as: Influenza vaccine. This is recommended every year. Tetanus, diphtheria, and acellular pertussis (Tdap, Td) vaccine. You may need a Td booster every 10 years. Zoster vaccine. You may need this after age 16. Pneumococcal 13-valent conjugate (PCV13) vaccine. One dose is recommended after age 88. Pneumococcal polysaccharide (PPSV23) vaccine. One dose is recommended after age 50. Talk to your health care provider about which screenings and vaccines you need and how often you need them. This information is not intended to replace advice given to you by your health  care provider. Make sure you discuss any questions you have with your health care provider. Document Released: 05/30/2015 Document Revised: 01/21/2016 Document Reviewed: 03/04/2015 Elsevier Interactive Patient Education  2017 [employer] Prevention in the Home Falls can cause injuries. They can happen to people of all ages. There are many things you can do to make your home safe and to  help prevent falls. What can I do on the outside of my home? Regularly fix the edges of walkways and driveways and fix any cracks. Remove anything that might make you trip as you walk through a door, such as a raised step or threshold. Trim any bushes or trees on the path to your home. Use bright outdoor lighting. Clear any walking paths of anything that might make someone trip, such as rocks or tools. Regularly check to see if handrails are loose or broken. Make sure that both sides of any steps have handrails. Any raised decks and porches should have guardrails on the edges. Have any leaves, snow, or ice cleared regularly. Use sand or salt on walking paths during winter. Clean up any spills in your garage right away. This includes oil or grease spills. What can I do in the bathroom? Use night lights. Install grab bars by the toilet and in the tub and shower. Do not use towel bars as grab bars. Use non-skid mats or decals in the tub or shower. If you need to sit down in the shower, use a plastic, non-slip stool. Keep the floor dry. Clean up any water that spills on the floor as soon as it happens. Remove soap buildup in the tub or shower regularly. Attach bath mats securely with double-sided non-slip rug tape. Do not have throw rugs and other things on the floor that can make you trip. What can I do in the bedroom? Use night lights. Make sure that you have a light by your bed that is easy to reach. Do not use any sheets or blankets that are too big for your bed. They should not hang down onto the floor. Have a firm chair that has side arms. You can use this for support while you get dressed. Do not have throw rugs and other things on the floor that can make you trip. What can I do in the kitchen? Clean up any spills right away. Avoid walking on wet floors. Keep items that you use a lot in easy-to-reach places. If you need to reach something above you, use a strong step stool that has  a grab bar. Keep electrical cords out of the way. Do not use floor polish or wax that makes floors slippery. If you must use wax, use non-skid floor wax. Do not have throw rugs and other things on the floor that can make you trip. What can I do with my stairs? Do not leave any items on the stairs. Make sure that there are handrails on both sides of the stairs and use them. Fix handrails that are broken or loose. Make sure that handrails are as long as the stairways. Check any carpeting to make sure that it is firmly attached to the stairs. Fix any carpet that is loose or worn. Avoid having throw rugs at the top or bottom of the stairs. If you do have throw rugs, attach them to the floor with carpet tape. Make sure that you have a light switch at the top of the stairs and the bottom of the stairs. If you do not  have them, ask someone to add them for you. What else can I do to help prevent falls? Wear shoes that: Do not have high heels. Have rubber bottoms. Are comfortable and fit you well. Are closed at the toe. Do not wear sandals. If you use a stepladder: Make sure that it is fully opened. Do not climb a closed stepladder. Make sure that both sides of the stepladder are locked into place. Ask someone to hold it for you, if possible. Clearly mark and make sure that you can see: Any grab bars or handrails. First and last steps. Where the edge of each step is. Use tools that help you move around (mobility aids) if they are needed. These include: Canes. Walkers. Scooters. Crutches. Turn on the lights when you go into a dark area. Replace any light bulbs as soon as they burn out. Set up your furniture so you have a clear path. Avoid moving your furniture around. If any of your floors are uneven, fix them. If there are any pets around you, be aware of where they are. Review your medicines with your doctor. Some medicines can make you feel dizzy. This can increase your chance of  falling. Ask your doctor what other things that you can do to help prevent falls. This information is not intended to replace advice given to you by your health care provider. Make sure you discuss any questions you have with your health care provider. Document Released: 02/27/2009 Document Revised: 10/09/2015 Document Reviewed: 06/07/2014 Elsevier Interactive Patient Education  2017 [employer].

## 2022-05-05 ENCOUNTER — Telehealth: Payer: Self-pay | Admitting: Family Medicine

## 2022-05-17 NOTE — Telephone Encounter (Signed)
Patient passed away today at 12:53 PM.

## 2022-05-17 DEATH — deceased

## 2022-06-11 ENCOUNTER — Ambulatory Visit: Payer: Medicare Other | Admitting: Family Medicine
